# Patient Record
Sex: Female | Born: 1960
Health system: Southern US, Community
[De-identification: ages and names within clinical notes are randomized; demographics above are authoritative.]

## PROBLEM LIST (undated history)

## (undated) ENCOUNTER — Encounter

## (undated) ENCOUNTER — Telehealth

## (undated) ENCOUNTER — Encounter: Attending: Neurology | Primary: Neurology

## (undated) ENCOUNTER — Ambulatory Visit

## (undated) ENCOUNTER — Encounter: Attending: Neurological Surgery | Primary: Neurological Surgery

## (undated) ENCOUNTER — Encounter: Payer: MEDICARE | Attending: Neurology | Primary: Neurology

## (undated) ENCOUNTER — Ambulatory Visit: Attending: Pharmacist | Primary: Pharmacist

## (undated) ENCOUNTER — Ambulatory Visit: Payer: MEDICARE

## (undated) ENCOUNTER — Telehealth: Attending: Social Worker | Primary: Social Worker

## (undated) ENCOUNTER — Telehealth
Attending: Student in an Organized Health Care Education/Training Program | Primary: Student in an Organized Health Care Education/Training Program

## (undated) ENCOUNTER — Encounter: Attending: Pharmacist | Primary: Pharmacist

## (undated) ENCOUNTER — Encounter: Attending: Physician Assistant | Primary: Physician Assistant

## (undated) ENCOUNTER — Encounter
Attending: Pharmacist Clinician (PhC)/ Clinical Pharmacy Specialist | Primary: Pharmacist Clinician (PhC)/ Clinical Pharmacy Specialist

## (undated) ENCOUNTER — Telehealth: Attending: Neurology | Primary: Neurology

## (undated) ENCOUNTER — Telehealth: Attending: Clinical | Primary: Clinical

## (undated) ENCOUNTER — Telehealth
Attending: Pharmacist Clinician (PhC)/ Clinical Pharmacy Specialist | Primary: Pharmacist Clinician (PhC)/ Clinical Pharmacy Specialist

## (undated) ENCOUNTER — Ambulatory Visit
Attending: Pharmacist Clinician (PhC)/ Clinical Pharmacy Specialist | Primary: Pharmacist Clinician (PhC)/ Clinical Pharmacy Specialist

## (undated) ENCOUNTER — Non-Acute Institutional Stay: Payer: MEDICARE | Attending: Neurology | Primary: Neurology

## (undated) DIAGNOSIS — Z9981 Dependence on supplemental oxygen: Secondary | ICD-10-CM

## (undated) DIAGNOSIS — I671 Cerebral aneurysm, nonruptured: Secondary | ICD-10-CM

## (undated) DIAGNOSIS — I2721 Secondary pulmonary arterial hypertension: Secondary | ICD-10-CM

## (undated) DIAGNOSIS — I700 Atherosclerosis of aorta: Secondary | ICD-10-CM

## (undated) DIAGNOSIS — Z720 Tobacco use: Secondary | ICD-10-CM

## (undated) DIAGNOSIS — G4730 Sleep apnea, unspecified: Secondary | ICD-10-CM

## (undated) DIAGNOSIS — M5030 Other cervical disc degeneration, unspecified cervical region: Secondary | ICD-10-CM

## (undated) DIAGNOSIS — I209 Angina pectoris, unspecified: Secondary | ICD-10-CM

## (undated) DIAGNOSIS — R519 Headache, unspecified: Secondary | ICD-10-CM

## (undated) DIAGNOSIS — M797 Fibromyalgia: Secondary | ICD-10-CM

## (undated) DIAGNOSIS — E038 Other specified hypothyroidism: Secondary | ICD-10-CM

## (undated) DIAGNOSIS — N189 Chronic kidney disease, unspecified: Secondary | ICD-10-CM

## (undated) DIAGNOSIS — G8929 Other chronic pain: Secondary | ICD-10-CM

## (undated) DIAGNOSIS — M791 Myalgia, unspecified site: Secondary | ICD-10-CM

## (undated) DIAGNOSIS — I201 Angina pectoris with documented spasm: Secondary | ICD-10-CM

## (undated) DIAGNOSIS — F329 Major depressive disorder, single episode, unspecified: Secondary | ICD-10-CM

## (undated) DIAGNOSIS — F419 Anxiety disorder, unspecified: Secondary | ICD-10-CM

## (undated) DIAGNOSIS — J9611 Chronic respiratory failure with hypoxia: Secondary | ICD-10-CM

## (undated) DIAGNOSIS — R0600 Dyspnea, unspecified: Secondary | ICD-10-CM

## (undated) DIAGNOSIS — J45909 Unspecified asthma, uncomplicated: Secondary | ICD-10-CM

## (undated) DIAGNOSIS — M792 Neuralgia and neuritis, unspecified: Secondary | ICD-10-CM

## (undated) DIAGNOSIS — G35 Multiple sclerosis: Secondary | ICD-10-CM

## (undated) DIAGNOSIS — M609 Myositis, unspecified: Secondary | ICD-10-CM

## (undated) DIAGNOSIS — I219 Acute myocardial infarction, unspecified: Secondary | ICD-10-CM

## (undated) DIAGNOSIS — J42 Unspecified chronic bronchitis: Secondary | ICD-10-CM

## (undated) DIAGNOSIS — R5383 Other fatigue: Secondary | ICD-10-CM

## (undated) DIAGNOSIS — I509 Heart failure, unspecified: Secondary | ICD-10-CM

## (undated) DIAGNOSIS — F32A Depression, unspecified: Secondary | ICD-10-CM

## (undated) DIAGNOSIS — M51369 Other intervertebral disc degeneration, lumbar region without mention of lumbar back pain or lower extremity pain: Secondary | ICD-10-CM

## (undated) DIAGNOSIS — J449 Chronic obstructive pulmonary disease, unspecified: Secondary | ICD-10-CM

## (undated) DIAGNOSIS — I2510 Atherosclerotic heart disease of native coronary artery without angina pectoris: Secondary | ICD-10-CM

## (undated) DIAGNOSIS — F411 Generalized anxiety disorder: Secondary | ICD-10-CM

## (undated) DIAGNOSIS — T6701XA Heatstroke and sunstroke, initial encounter: Secondary | ICD-10-CM

## (undated) DIAGNOSIS — I3139 Other pericardial effusion (noninflammatory): Secondary | ICD-10-CM

## (undated) DIAGNOSIS — F1190 Opioid use, unspecified, uncomplicated: Secondary | ICD-10-CM

## (undated) DIAGNOSIS — I639 Cerebral infarction, unspecified: Secondary | ICD-10-CM

## (undated) DIAGNOSIS — K589 Irritable bowel syndrome without diarrhea: Secondary | ICD-10-CM

## (undated) DIAGNOSIS — I5032 Chronic diastolic (congestive) heart failure: Secondary | ICD-10-CM

## (undated) DIAGNOSIS — M546 Pain in thoracic spine: Secondary | ICD-10-CM

## (undated) DIAGNOSIS — G35D Multiple sclerosis, unspecified: Secondary | ICD-10-CM

## (undated) DIAGNOSIS — G894 Chronic pain syndrome: Secondary | ICD-10-CM

## (undated) DIAGNOSIS — I10 Essential (primary) hypertension: Secondary | ICD-10-CM

## (undated) DIAGNOSIS — M1990 Unspecified osteoarthritis, unspecified site: Secondary | ICD-10-CM

## (undated) HISTORY — PX: LAPAROSCOPIC ENDOMETRIOSIS FULGURATION: SUR769

## (undated) HISTORY — PX: ABDOMINAL HYSTERECTOMY: SHX81

## (undated) HISTORY — DX: Chronic obstructive pulmonary disease, unspecified: J44.9

## (undated) HISTORY — DX: Angina pectoris with documented spasm: I20.1

## (undated) HISTORY — DX: Tobacco use: Z72.0

## (undated) HISTORY — DX: Multiple sclerosis, unspecified: G35.D

## (undated) HISTORY — DX: Chronic diastolic (congestive) heart failure: I50.32

## (undated) HISTORY — DX: Unspecified osteoarthritis, unspecified site: M19.90

## (undated) HISTORY — DX: Irritable bowel syndrome, unspecified: K58.9

## (undated) HISTORY — DX: Fibromyalgia: M79.7

## (undated) HISTORY — DX: Secondary pulmonary arterial hypertension: I27.21

## (undated) HISTORY — DX: Depression, unspecified: F32.A

## (undated) HISTORY — DX: Major depressive disorder, single episode, unspecified: F32.9

## (undated) HISTORY — DX: Multiple sclerosis: G35

## (undated) HISTORY — DX: Other pericardial effusion (noninflammatory): I31.39

---
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
## 1999-12-26 HISTORY — PX: FOOT SURGERY: SHX648

## 2004-12-25 DIAGNOSIS — T6701XA Heatstroke and sunstroke, initial encounter: Secondary | ICD-10-CM

## 2004-12-25 HISTORY — DX: Heatstroke and sunstroke, initial encounter: T67.01XA

## 2005-04-20 ENCOUNTER — Ambulatory Visit: Payer: Self-pay | Admitting: Unknown Physician Specialty

## 2008-05-15 ENCOUNTER — Ambulatory Visit: Payer: Self-pay

## 2010-05-16 ENCOUNTER — Ambulatory Visit: Payer: Self-pay | Admitting: Gastroenterology

## 2010-08-25 LAB — HM MAMMOGRAPHY

## 2011-12-15 ENCOUNTER — Ambulatory Visit: Payer: Self-pay | Admitting: Family Medicine

## 2012-08-02 ENCOUNTER — Ambulatory Visit: Payer: Self-pay | Admitting: Otolaryngology

## 2012-10-21 ENCOUNTER — Encounter: Payer: Self-pay | Admitting: *Deleted

## 2012-10-21 ENCOUNTER — Ambulatory Visit (INDEPENDENT_AMBULATORY_CARE_PROVIDER_SITE_OTHER): Payer: BC Managed Care – PPO | Admitting: Internal Medicine

## 2012-10-21 ENCOUNTER — Encounter: Payer: Self-pay | Admitting: Internal Medicine

## 2012-10-21 ENCOUNTER — Ambulatory Visit (INDEPENDENT_AMBULATORY_CARE_PROVIDER_SITE_OTHER)
Admission: RE | Admit: 2012-10-21 | Discharge: 2012-10-21 | Disposition: A | Discharge status: Home / Self Care | Payer: BC Managed Care – PPO | Source: Ambulatory Visit | Attending: Internal Medicine | Admitting: Internal Medicine

## 2012-10-21 VITALS — BP 140/92 | HR 89 | Temp 98.4°F | Ht 65.0 in | Wt 178.5 lb

## 2012-10-21 DIAGNOSIS — M545 Low back pain, unspecified: Secondary | ICD-10-CM

## 2012-10-21 DIAGNOSIS — IMO0001 Reserved for inherently not codable concepts without codable children: Secondary | ICD-10-CM

## 2012-10-21 DIAGNOSIS — E039 Hypothyroidism, unspecified: Secondary | ICD-10-CM

## 2012-10-21 DIAGNOSIS — M797 Fibromyalgia: Secondary | ICD-10-CM

## 2012-10-21 DIAGNOSIS — A6000 Herpesviral infection of urogenital system, unspecified: Secondary | ICD-10-CM

## 2012-10-21 DIAGNOSIS — E038 Other specified hypothyroidism: Secondary | ICD-10-CM | POA: Insufficient documentation

## 2012-10-21 MED ORDER — ACYCLOVIR 400 MG PO TABS: 400.0000 mg | ORAL_TABLET | Freq: Every day | ORAL | Status: DC

## 2012-10-21 MED ORDER — LEVOTHYROXINE SODIUM 50 MCG PO TABS: 50.0000 ug | ORAL_TABLET | Freq: Every day | ORAL | Status: DC

## 2012-10-21 MED ORDER — LIDOCAINE 5 % EX PTCH: 1.0000 | MEDICATED_PATCH | CUTANEOUS | Status: DC

## 2012-10-21 MED ORDER — PREGABALIN 50 MG PO CAPS: 50.0000 mg | ORAL_CAPSULE | Freq: Three times a day (TID) | ORAL | Status: DC

## 2012-10-21 NOTE — Progress Notes (Signed)
Subjective:    Patient ID: Debra Hogan, female    DOB: 05/20/1961, 51 y.o.   MRN: [id number]  HPI 51 year old female with history of fibromyalgia, COPD, hypothyroidism presents to establish care. Her primary concern today is severe fibromyalgia pain. She reports pain in her arms, upper and lower back, and legs. She reports that pain is severe it limits her ability to function. She has tried nonsteroidal medications, muscle relaxers, tramadol with no improvement. She has also tried Cymbalta with no improvement. She requests stronger medication. She reports that her previous physician refused to write narcotic medication for her.  In regards to hypothyroidism, she reports she has been off her Synthroid for several months. She would like to restart this medication.  In regards to general herpes, she reports that symptoms were previously well controlled with the use of acyclovir. She would like to get a refill on this medication today.  Outpatient Encounter Prescriptions as of 10/21/2012  Medication Sig Dispense Refill  . acyclovir (ZOVIRAX) 400 MG tablet Take 1 tablet (400 mg total) by mouth daily.  90 tablet  3  . Fluticasone-Salmeterol (ADVAIR) 100-50 MCG/DOSE AEPB Inhale 1 puff into the lungs as needed.      Marland Kitchen levothyroxine (SYNTHROID, LEVOTHROID) 50 MCG tablet Take 1 tablet (50 mcg total) by mouth daily.  90 tablet  4  . lidocaine (LIDODERM) 5 % Place 1 patch onto the skin daily. Remove & Discard patch within 12 hours or as directed by MD  30 patch  0  . pregabalin (LYRICA) 50 MG capsule Take 1 capsule (50 mg total) by mouth 3 (three) times daily.  60 capsule  3  . rotigotine (NEUPRO) 2 MG/24HR Place 1 patch onto the skin daily.       BP 140/92  Pulse 89  Temp 98.4 F (36.9 C) (Oral)  Ht 5\' 5"  (1.651 m)  Wt 178 lb 8 oz (80.967 kg)  BMI 29.70 kg/m2  SpO2 96%  Review of Systems  Constitutional: Positive for fatigue. Negative for fever, chills, appetite change and unexpected weight change.   HENT: Negative for ear pain, congestion, sore throat, trouble swallowing, neck pain, voice change and sinus pressure.   Eyes: Negative for visual disturbance.  Respiratory: Negative for cough, shortness of breath, wheezing and stridor.   Cardiovascular: Negative for chest pain, palpitations and leg swelling.  Gastrointestinal: Negative for nausea, vomiting, abdominal pain, diarrhea, constipation, blood in stool, abdominal distention and anal bleeding.  Genitourinary: Negative for dysuria and flank pain.  Musculoskeletal: Positive for myalgias, back pain and arthralgias. Negative for gait problem.  Skin: Negative for color change and rash.  Neurological: Negative for dizziness and headaches.  Hematological: Negative for adenopathy. Does not bruise/bleed easily.  Psychiatric/Behavioral: Negative for suicidal ideas, disturbed wake/sleep cycle and dysphoric mood. The patient is not nervous/anxious.        Objective:   Physical Exam  Constitutional: She is oriented to person, place, and time. She appears well-developed and well-nourished. No distress.  HENT:  Head: Normocephalic and atraumatic.  Right Ear: External ear normal.  Left Ear: External ear normal.  Nose: Nose normal.  Mouth/Throat: Oropharynx is clear and moist. No oropharyngeal exudate.  Eyes: Conjunctivae normal are normal. Pupils are equal, round, and reactive to light. Right eye exhibits no discharge. Left eye exhibits no discharge. No scleral icterus.  Neck: Normal range of motion. Neck supple. No tracheal deviation present. No thyromegaly present.  Cardiovascular: Normal rate, regular rhythm, normal heart sounds and intact distal pulses.  Exam reveals no gallop and no friction rub.   No murmur heard. Pulmonary/Chest: Effort normal and breath sounds normal. No respiratory distress. She has no wheezes. She has no rales. She exhibits no tenderness.  Musculoskeletal: She exhibits no edema and no tenderness.       Cervical back:  She exhibits decreased range of motion, tenderness and pain.       Lumbar back: She exhibits decreased range of motion, tenderness, pain and spasm.  Lymphadenopathy:    She has no cervical adenopathy.  Neurological: She is alert and oriented to person, place, and time. No cranial nerve deficit. She exhibits normal muscle tone. Coordination normal.  Skin: Skin is warm and dry. No rash noted. She is not diaphoretic. No erythema. No pallor.  Psychiatric: She has a normal mood and affect. Her behavior is normal. Judgment and thought content normal.          Assessment & Plan:

## 2012-10-21 NOTE — Assessment & Plan Note (Signed)
Patient has been off Synthroid. Will restart today. Will check TSH with labs. Followup one month.

## 2012-10-21 NOTE — Assessment & Plan Note (Signed)
Patient reports history of fibromyalgia. No improvement with nonsteroidals, tramadol, muscle relaxers. Requests stronger medication. Will try Lyrica starting at 50 mg twice daily. She Campanelli titrate up to 3 times daily. We'll also add a Lidoderm patch over her lower back at site of severe pain. She will e-mail with update. We'll also set up evaluation with rheumatology. Would prefer to avoid use of narcotic medications for chronic pain management.

## 2012-10-21 NOTE — Assessment & Plan Note (Signed)
Symptoms well controlled with daily acyclovir. Will refill acyclovir today.

## 2012-10-21 NOTE — Assessment & Plan Note (Signed)
Low back pain likely secondary to degenerative disease in the lumbar spine, seen on plain film today. As above, adding Lidoderm patch to see if any improvement in pain control. Followup one month.

## 2012-11-07 ENCOUNTER — Encounter: Payer: Self-pay | Admitting: Internal Medicine

## 2012-11-18 ENCOUNTER — Ambulatory Visit (INDEPENDENT_AMBULATORY_CARE_PROVIDER_SITE_OTHER): Payer: BC Managed Care – PPO | Admitting: Internal Medicine

## 2012-11-18 ENCOUNTER — Encounter: Payer: Self-pay | Admitting: Internal Medicine

## 2012-11-18 VITALS — BP 150/88 | HR 93 | Temp 98.0°F | Resp 16 | Wt 182.0 lb

## 2012-11-18 DIAGNOSIS — M797 Fibromyalgia: Secondary | ICD-10-CM

## 2012-11-18 DIAGNOSIS — IMO0001 Reserved for inherently not codable concepts without codable children: Secondary | ICD-10-CM

## 2012-11-18 MED ORDER — DULOXETINE HCL 30 MG PO CPEP: 30.0000 mg | ORAL_CAPSULE | Freq: Every day | ORAL | Status: DC

## 2012-11-18 MED ORDER — PREGABALIN 50 MG PO CAPS: 100.0000 mg | ORAL_CAPSULE | Freq: Two times a day (BID) | ORAL | Status: DC

## 2012-11-18 MED ORDER — CYCLOBENZAPRINE HCL 5 MG PO TABS: 5.0000 mg | ORAL_TABLET | Freq: Every evening | ORAL | Status: DC | PRN

## 2012-11-18 NOTE — Assessment & Plan Note (Signed)
Minimal improvement with Lyrica. Will increase dose to 100 mg twice daily. Will add Cymbalta 30 mg daily. Will add Flexeril 5 mg at bedtime to help with muscle spasm. Rheumatology referral in process. Will check labs today including CBC, CMP, ESR, ANA, rheumatoid factor, TSH, B12. Followup one month or sooner if needed.

## 2012-11-18 NOTE — Progress Notes (Signed)
Subjective:    Patient ID: Debra Hogan, female    DOB: September 30, 1961, 51 y.o.   MRN: [id number]  HPI 51 year old female with history of fibromyalgia presents for followup. She reports severe diffuse pain, rated 10 out of 10, as the worst pain of her life. She reports spasm in the muscles of her back and legs. She has crunching and writhing in pain during exam. At her last visit, she was started on Lyrica and is now taking 50 mg twice daily with no improvement in her symptoms. She was also started on Lidoderm patch no improvement. She reports that nonsteroidal such as meloxicam do not help her. She reports difficulty working and functioning because of pain. Pain is described as severe aching and cramping over her trunk, legs, and arms. She denies swelling in her joints, redness over her joints. She denies fever or chills. She denies shortness of breath.  Outpatient Encounter Prescriptions as of 11/18/2012  Medication Sig Dispense Refill  . acyclovir (ZOVIRAX) 400 MG tablet Take 1 tablet (400 mg total) by mouth daily.  90 tablet  3  . Fluticasone-Salmeterol (ADVAIR) 100-50 MCG/DOSE AEPB Inhale 1 puff into the lungs as needed.      Marland Kitchen levothyroxine (SYNTHROID, LEVOTHROID) 50 MCG tablet Take 1 tablet (50 mcg total) by mouth daily.  90 tablet  4  . lidocaine (LIDODERM) 5 % Place 1 patch onto the skin daily. Remove & Discard patch within 12 hours or as directed by MD  30 patch  0  . pregabalin (LYRICA) 50 MG capsule Take 2 capsules (100 mg total) by mouth 2 (two) times daily.  60 capsule  3  . rotigotine (NEUPRO) 2 MG/24HR Place 1 patch onto the skin daily.      . [DISCONTINUED] pregabalin (LYRICA) 50 MG capsule Take 1 capsule (50 mg total) by mouth 3 (three) times daily.  60 capsule  3  . cyclobenzaprine (FLEXERIL) 5 MG tablet Take 1 tablet (5 mg total) by mouth at bedtime as needed for muscle spasms.  30 tablet  1  . DULoxetine (CYMBALTA) 30 MG capsule Take 1 capsule (30 mg total) by mouth daily.  30 capsule   3   BP 150/88  Pulse 93  Temp 98 F (36.7 C) (Oral)  Resp 16  Wt 182 lb (82.555 kg)  Review of Systems  Constitutional: Negative for fever, chills, appetite change, fatigue and unexpected weight change.  HENT: Negative for ear pain, congestion, sore throat, trouble swallowing, neck pain, voice change and sinus pressure.   Eyes: Negative for visual disturbance.  Respiratory: Negative for cough, shortness of breath, wheezing and stridor.   Cardiovascular: Negative for chest pain, palpitations and leg swelling.  Gastrointestinal: Negative for nausea, vomiting, abdominal pain, diarrhea, constipation, blood in stool, abdominal distention and anal bleeding.  Genitourinary: Negative for dysuria and flank pain.  Musculoskeletal: Positive for myalgias and arthralgias. Negative for gait problem.  Skin: Negative for color change and rash.  Neurological: Negative for dizziness and headaches.  Hematological: Negative for adenopathy. Does not bruise/bleed easily.  Psychiatric/Behavioral: Positive for dysphoric mood. Negative for suicidal ideas and sleep disturbance. The patient is not nervous/anxious.        Objective:   Physical Exam  Constitutional: She is oriented to person, place, and time. She appears well-developed and well-nourished. No distress.  HENT:  Head: Normocephalic and atraumatic.  Right Ear: External ear normal.  Left Ear: External ear normal.  Nose: Nose normal.  Mouth/Throat: Oropharynx is clear and moist. No oropharyngeal  exudate.  Eyes: Conjunctivae normal are normal. Pupils are equal, round, and reactive to light. Right eye exhibits no discharge. Left eye exhibits no discharge. No scleral icterus.  Neck: Normal range of motion. Neck supple. No tracheal deviation present. No thyromegaly present.  Cardiovascular: Normal rate, regular rhythm, normal heart sounds and intact distal pulses.  Exam reveals no gallop and no friction rub.   No murmur heard. Pulmonary/Chest:  Effort normal and breath sounds normal. No respiratory distress. She has no wheezes. She has no rales. She exhibits no tenderness.  Musculoskeletal: Normal range of motion. She exhibits no edema and no tenderness.       Right shoulder: She exhibits tenderness.       Left shoulder: She exhibits tenderness.       Right elbow: tenderness found.       Left elbow: tenderness found.       Right wrist: She exhibits tenderness.       Left wrist: She exhibits tenderness.       Cervical back: She exhibits tenderness.       Diffuse tenderness to palpations but no obvious abnormalities of musculature or joints noted.  Lymphadenopathy:    She has no cervical adenopathy.  Neurological: She is alert and oriented to person, place, and time. No cranial nerve deficit. She exhibits normal muscle tone. Coordination normal.  Skin: Skin is warm and dry. No rash noted. She is not diaphoretic. No erythema. No pallor.  Psychiatric: Her speech is normal and behavior is normal. Judgment and thought content normal. She exhibits a depressed mood.          Assessment & Plan:

## 2012-11-19 LAB — SEDIMENTATION RATE: Sed Rate: 19 mm/hr (ref 0–22)

## 2012-11-19 LAB — RHEUMATOID FACTOR: Rhuematoid fact SerPl-aCnc: <10 IU/mL (ref <=14)

## 2012-11-19 LAB — CBC WITH DIFFERENTIAL/PLATELET
Basophils Relative: 0.9 % (ref 0.0–3.0)
Eosinophils Absolute: 0.1 10*3/uL (ref 0.0–0.7)
MCHC: 33.5 g/dL (ref 30.0–36.0)
MCV: 92.2 fl (ref 78.0–100.0)
Monocytes Absolute: 0.4 10*3/uL (ref 0.1–1.0)
Neutrophils Relative %: 62.5 % (ref 43.0–77.0)
Platelets: 216 10*3/uL (ref 150.0–400.0)
RBC: 4.17 Mil/uL (ref 3.87–5.11)
RDW: 12.6 % (ref 11.5–14.6)

## 2012-11-19 LAB — COMPREHENSIVE METABOLIC PANEL
AST: 34 U/L (ref 0–37)
Albumin: 4 g/dL (ref 3.5–5.2)
Alkaline Phosphatase: 73 U/L (ref 39–117)
BUN: 11 mg/dL (ref 6–23)
Potassium: 3.5 mEq/L (ref 3.5–5.1)

## 2012-11-19 LAB — C-REACTIVE PROTEIN: CRP: 1.2 mg/dL (ref 0.5–20.0)

## 2012-11-19 LAB — TSH: TSH: 1.31 u[IU]/mL (ref 0.35–5.50)

## 2012-11-24 ENCOUNTER — Encounter: Payer: Self-pay | Admitting: Internal Medicine

## 2012-12-01 ENCOUNTER — Encounter: Payer: Self-pay | Admitting: Internal Medicine

## 2013-01-01 ENCOUNTER — Ambulatory Visit (INDEPENDENT_AMBULATORY_CARE_PROVIDER_SITE_OTHER): Payer: BC Managed Care – PPO | Admitting: Internal Medicine

## 2013-01-01 ENCOUNTER — Encounter: Payer: Self-pay | Admitting: Internal Medicine

## 2013-01-01 VITALS — BP 124/80 | HR 95 | Temp 98.4°F | Ht 65.0 in | Wt 176.5 lb

## 2013-01-01 DIAGNOSIS — M797 Fibromyalgia: Secondary | ICD-10-CM

## 2013-01-01 DIAGNOSIS — IMO0001 Reserved for inherently not codable concepts without codable children: Secondary | ICD-10-CM

## 2013-01-01 MED ORDER — DULOXETINE HCL 60 MG PO CPEP: 60.0000 mg | ORAL_CAPSULE | Freq: Every day | ORAL | Status: DC

## 2013-01-01 MED ORDER — PREGABALIN 75 MG PO CAPS: 75.0000 mg | ORAL_CAPSULE | Freq: Two times a day (BID) | ORAL | Status: DC

## 2013-01-01 NOTE — Assessment & Plan Note (Signed)
Patient reports that she was not taking Lyrica. She has been taking Cymbalta. Will try increasing Cymbalta dose to 60 mg daily. We'll then add Lyrica 75 mg twice daily. Samples were given today. Rheumatology referral is scheduled for later this month. Recent lab evaluation was unremarkable. Symptoms of severe pain are persistent and are not explained by exam findings. Follow up 1 month.

## 2013-01-01 NOTE — Progress Notes (Signed)
Subjective:    Patient ID: Debra Hogan, female    DOB: 07-31-1961, 52 y.o.   MRN: [id number]  HPI 52 year old female with history of fibromyalgia presents for followup. She reports that she has been taking Cymbalta but not Lyrica. She reports no improvement with this medication. She describes anger and frustration at the fact that we have not been able to improve her pain control. She reports that pain limits her ability to sleep and function. Pain is described as diffuse aching and cramping throughout her muscles and joints. She is also having some increased fatigue and headache over the last couple of days. She reports that pain has been so bad at times that she is contemplating going to the emergency room. She had tried using Flexeril with no improvement.   Outpatient Encounter Prescriptions as of 01/01/2013  Medication Sig Dispense Refill  . acyclovir (ZOVIRAX) 400 MG tablet Take 1 tablet (400 mg total) by mouth daily.  90 tablet  3  . cyclobenzaprine (FLEXERIL) 5 MG tablet Take 1 tablet (5 mg total) by mouth at bedtime as needed for muscle spasms.  30 tablet  1  . DULoxetine (CYMBALTA) 60 MG capsule Take 1 capsule (60 mg total) by mouth daily.  30 capsule  3  . Fluticasone-Salmeterol (ADVAIR) 100-50 MCG/DOSE AEPB Inhale 1 puff into the lungs as needed.      Marland Kitchen levothyroxine (SYNTHROID, LEVOTHROID) 50 MCG tablet Take 1 tablet (50 mcg total) by mouth daily.  90 tablet  4   BP 124/80  Pulse 95  Temp 98.4 F (36.9 C) (Oral)  Ht 5\' 5"  (1.651 m)  Wt 176 lb 8 oz (80.06 kg)  BMI 29.37 kg/m2  SpO2 93%  Review of Systems  Constitutional: Negative for fever, chills, appetite change, fatigue and unexpected weight change.  HENT: Negative for ear pain, congestion, sore throat, trouble swallowing, neck pain, voice change and sinus pressure.   Eyes: Negative for visual disturbance.  Respiratory: Negative for cough, shortness of breath, wheezing and stridor.   Cardiovascular: Negative for chest pain,  palpitations and leg swelling.  Gastrointestinal: Negative for nausea, vomiting, abdominal pain, diarrhea, constipation, blood in stool, abdominal distention and anal bleeding.  Genitourinary: Negative for dysuria and flank pain.  Musculoskeletal: Positive for myalgias, back pain and arthralgias. Negative for gait problem.  Skin: Negative for color change and rash.  Neurological: Negative for dizziness and headaches.  Hematological: Negative for adenopathy. Does not bruise/bleed easily.  Psychiatric/Behavioral: Positive for sleep disturbance and dysphoric mood. Negative for suicidal ideas. The patient is nervous/anxious.        Objective:   Physical Exam  Constitutional: She is oriented to person, place, and time. She appears well-developed and well-nourished. No distress.  HENT:  Head: Normocephalic and atraumatic.  Right Ear: External ear normal.  Left Ear: External ear normal.  Nose: Nose normal.  Mouth/Throat: Oropharynx is clear and moist. No oropharyngeal exudate.  Eyes: Conjunctivae normal are normal. Pupils are equal, round, and reactive to light. Right eye exhibits no discharge. Left eye exhibits no discharge. No scleral icterus.  Neck: Normal range of motion. Neck supple. No tracheal deviation present. No thyromegaly present.  Cardiovascular: Normal rate, regular rhythm, normal heart sounds and intact distal pulses.  Exam reveals no gallop and no friction rub.   No murmur heard. Pulmonary/Chest: Effort normal and breath sounds normal. No respiratory distress. She has no wheezes. She has no rales. She exhibits no tenderness.  Musculoskeletal: Normal range of motion. She exhibits no edema  and no tenderness.       Cervical back: She exhibits pain.       Thoracic back: She exhibits pain.       Lumbar back: She exhibits pain.       Patient reports diffuse pain, most prominent in back, but also present in joints, muscles diffusely.  Lymphadenopathy:    She has no cervical  adenopathy.  Neurological: She is alert and oriented to person, place, and time. No cranial nerve deficit. She exhibits normal muscle tone. Coordination normal.  Skin: Skin is warm and dry. No rash noted. She is not diaphoretic. No erythema. No pallor.  Psychiatric: Judgment and thought content normal. Her mood appears anxious. Her affect is angry. She is agitated.          Assessment & Plan:

## 2013-01-30 ENCOUNTER — Ambulatory Visit (INDEPENDENT_AMBULATORY_CARE_PROVIDER_SITE_OTHER): Payer: BC Managed Care – PPO | Admitting: Internal Medicine

## 2013-01-30 ENCOUNTER — Encounter: Payer: Self-pay | Admitting: Internal Medicine

## 2013-01-30 VITALS — BP 120/80 | HR 86 | Temp 98.5°F

## 2013-01-30 DIAGNOSIS — A6000 Herpesviral infection of urogenital system, unspecified: Secondary | ICD-10-CM

## 2013-01-30 DIAGNOSIS — M797 Fibromyalgia: Secondary | ICD-10-CM

## 2013-01-30 DIAGNOSIS — IMO0001 Reserved for inherently not codable concepts without codable children: Secondary | ICD-10-CM

## 2013-01-30 MED ORDER — ACYCLOVIR 400 MG PO TABS: 400.0000 mg | ORAL_TABLET | Freq: Every day | ORAL | Status: DC

## 2013-01-30 MED ORDER — KETOROLAC TROMETHAMINE 30 MG/ML IJ SOLN
30.0000 mg | Freq: Once | INTRAMUSCULAR | Status: AC
  Administered 2013-01-30: 30 mg via INTRAMUSCULAR

## 2013-01-30 MED ORDER — PREGABALIN 150 MG PO CAPS: 150.0000 mg | ORAL_CAPSULE | Freq: Every evening | ORAL | Status: DC | PRN

## 2013-01-30 NOTE — Progress Notes (Signed)
Subjective:    Patient ID: Debra Hogan, female    DOB: [birth year], 52 y.o.   MRN: [id number]  HPI 51YO female with h/o fibromyalgia presents for follow up. Continues to have severe, diffuse "10/10" pain, today most prominent in right shoulder and right flank. Also c/o headache pain. These are typical of symptoms of fibromyalgia. Taking Cymbalta, but has not yet started Lyrica because unable to afford medication.Evaluated by rheumatology and lab evaluation pending.  Outpatient Encounter Prescriptions as of 01/30/2013  Medication Sig Dispense Refill  . acyclovir (ZOVIRAX) 400 MG tablet Take 1 tablet (400 mg total) by mouth daily.  90 tablet  3  . cyclobenzaprine (FLEXERIL) 5 MG tablet Take 1 tablet (5 mg total) by mouth at bedtime as needed for muscle spasms.  30 tablet  1  . DULoxetine (CYMBALTA) 60 MG capsule Take 1 capsule (60 mg total) by mouth daily.  30 capsule  3  . Fluticasone-Salmeterol (ADVAIR) 100-50 MCG/DOSE AEPB Inhale 1 puff into the lungs as needed.      Marland Kitchen levothyroxine (SYNTHROID, LEVOTHROID) 50 MCG tablet Take 1 tablet (50 mcg total) by mouth daily.  90 tablet  4  . pregabalin (LYRICA) 150 MG capsule Take 1 capsule (150 mg total) by mouth at bedtime as needed.  30 capsule  3  . [DISCONTINUED] acyclovir (ZOVIRAX) 400 MG tablet Take 1 tablet (400 mg total) by mouth daily.  90 tablet  3  . [DISCONTINUED] pregabalin (LYRICA) 75 MG capsule Take 1 capsule (75 mg total) by mouth 2 (two) times daily.  60 capsule  3  . [EXPIRED] ketorolac (TORADOL) 30 MG/ML injection 30 mg        BP 120/80  Pulse 86  Temp 98.5 F (36.9 C) (Oral)  SpO2 94%  Review of Systems  Constitutional: Positive for fatigue. Negative for fever, chills, appetite change and unexpected weight change.  HENT: Negative for ear pain, congestion, sore throat, trouble swallowing, neck pain, voice change and sinus pressure.   Eyes: Negative for visual disturbance.  Respiratory: Negative for cough, shortness of breath,  wheezing and stridor.   Cardiovascular: Negative for chest pain, palpitations and leg swelling.  Gastrointestinal: Negative for nausea, vomiting, abdominal pain, diarrhea, constipation, blood in stool, abdominal distention and anal bleeding.  Genitourinary: Negative for dysuria and flank pain.  Musculoskeletal: Positive for myalgias, back pain and arthralgias. Negative for gait problem.  Skin: Negative for color change and rash.  Neurological: Negative for dizziness and headaches.  Hematological: Negative for adenopathy. Does not bruise/bleed easily.  Psychiatric/Behavioral: Positive for dysphoric mood. Negative for suicidal ideas and sleep disturbance. The patient is not nervous/anxious.        Objective:   Physical Exam  Constitutional: She is oriented to person, place, and time. She appears well-developed and well-nourished. No distress.  HENT:  Head: Normocephalic and atraumatic.  Right Ear: External ear normal.  Left Ear: External ear normal.  Nose: Nose normal.  Mouth/Throat: Oropharynx is clear and moist. No oropharyngeal exudate.  Eyes: Conjunctivae normal are normal. Pupils are equal, round, and reactive to light. Right eye exhibits no discharge. Left eye exhibits no discharge. No scleral icterus.  Neck: Normal range of motion. Neck supple. No tracheal deviation present. No thyromegaly present.  Cardiovascular: Normal rate, regular rhythm, normal heart sounds and intact distal pulses.  Exam reveals no gallop and no friction rub.   No murmur heard. Pulmonary/Chest: Effort normal and breath sounds normal. No respiratory distress. She has no wheezes. She has no rales. She  exhibits no tenderness.  Musculoskeletal: Normal range of motion. She exhibits no edema and no tenderness.       Right shoulder: She exhibits tenderness and pain. She exhibits no swelling.  Lymphadenopathy:    She has no cervical adenopathy.  Neurological: She is alert and oriented to person, place, and time. No  cranial nerve deficit. She exhibits normal muscle tone. Coordination normal.  Skin: Skin is warm and dry. No rash noted. She is not diaphoretic. No erythema. No pallor.  Psychiatric: She has a normal mood and affect. Her behavior is normal. Judgment and thought content normal.          Assessment & Plan:

## 2013-01-30 NOTE — Assessment & Plan Note (Signed)
Persistent severe pain. Has not yet been able to fill Lyrica because of cost. Samples given today. Will continue Cymbalta and Lyrica. Follow up is scheduled with rheumatology. Will give injection of toradol today given flare of pain symptoms in right shoulder. Follow up here in 3 months.

## 2013-01-31 ENCOUNTER — Other Ambulatory Visit: Payer: Self-pay | Admitting: *Deleted

## 2013-01-31 DIAGNOSIS — A6000 Herpesviral infection of urogenital system, unspecified: Secondary | ICD-10-CM

## 2013-01-31 MED ORDER — ACYCLOVIR 400 MG PO TABS: 400.0000 mg | ORAL_TABLET | Freq: Every day | ORAL | Status: DC

## 2013-03-03 ENCOUNTER — Telehealth: Payer: Self-pay | Admitting: Internal Medicine

## 2013-03-03 ENCOUNTER — Encounter: Payer: Self-pay | Admitting: Adult Health

## 2013-03-03 ENCOUNTER — Ambulatory Visit (INDEPENDENT_AMBULATORY_CARE_PROVIDER_SITE_OTHER): Payer: BC Managed Care – PPO | Admitting: Adult Health

## 2013-03-03 VITALS — BP 140/80 | HR 89 | Temp 98.6°F | Resp 18 | Ht 65.0 in | Wt 179.5 lb

## 2013-03-03 DIAGNOSIS — J329 Chronic sinusitis, unspecified: Secondary | ICD-10-CM

## 2013-03-03 DIAGNOSIS — R509 Fever, unspecified: Secondary | ICD-10-CM

## 2013-03-03 MED ORDER — FLUTICASONE PROPIONATE 50 MCG/ACT NA SUSP: 2.0000 | Freq: Every day | NASAL | Status: DC

## 2013-03-03 MED ORDER — AMOXICILLIN-POT CLAVULANATE 875-125 MG PO TABS: 1.0000 | ORAL_TABLET | Freq: Two times a day (BID) | ORAL | Status: DC

## 2013-03-03 NOTE — Telephone Encounter (Signed)
Spoke with patient and she has been scheduled to see Raquel.

## 2013-03-03 NOTE — Assessment & Plan Note (Addendum)
Patient with low grade fever; however, she has been taking RTC tylenol so suspect it Berling actually be higher. Negative for flu. Will start augmentin to treat empirically. RTC if no improvement in symptoms within 3-4 days.

## 2013-03-03 NOTE — Progress Notes (Signed)
  Subjective:    Patient ID: Debra Hogan, female    DOB: 03-Oct-1961, 52 y.o.   MRN: [id number]  HPI  Patient presents to clinic today with URI symptoms - cough, sinus pressure and congestion, malaise, low grade fever. Her symptoms began on Thursday. She has been taking Tylenol and tylenol cold and sinus with some improvement in symptoms. She denies sore throat, shortness of breath, cp.  Current Outpatient Prescriptions on File Prior to Visit  Medication Sig Dispense Refill  . acyclovir (ZOVIRAX) 400 MG tablet Take 1 tablet (400 mg total) by mouth Hogan.  90 tablet  3  . cyclobenzaprine (FLEXERIL) 5 MG tablet Take 1 tablet (5 mg total) by mouth at bedtime as needed for muscle spasms.  30 tablet  1  . DULoxetine (CYMBALTA) 60 MG capsule Take 1 capsule (60 mg total) by mouth Hogan.  30 capsule  3  . Fluticasone-Salmeterol (ADVAIR) 100-50 MCG/DOSE AEPB Inhale 1 puff into the lungs as needed.      Marland Kitchen levothyroxine (SYNTHROID, LEVOTHROID) 50 MCG tablet Take 1 tablet (50 mcg total) by mouth Hogan.  90 tablet  4  . pregabalin (LYRICA) 150 MG capsule Take 1 capsule (150 mg total) by mouth at bedtime as needed.  30 capsule  3   No current facility-administered medications on file prior to visit.    Review of Systems  Constitutional: Positive for fever, chills and fatigue.  HENT: Positive for congestion, rhinorrhea, postnasal drip and sinus pressure. Negative for sore throat.   Respiratory: Positive for cough. Negative for chest tightness, shortness of breath and wheezing.   Cardiovascular: Negative for chest pain.    BP 140/80  Pulse 89  Temp(Src) 98.6 F (37 C) (Oral)  Resp 18  Ht 5\' 5"  (1.651 m)  Wt 179 lb 8 oz (81.421 kg)  BMI 29.87 kg/m2  SpO2 95%     Objective:   Physical Exam  Constitutional: She is oriented to person, place, and time. She appears well-developed and well-nourished.  Appears not feeling well.  HENT:  Head: Normocephalic and atraumatic.  Right Ear: External ear  normal.  Left Ear: External ear normal.  Mouth/Throat: Oropharynx is clear and moist. No oropharyngeal exudate.  Cardiovascular: Normal rate, regular rhythm and normal heart sounds.  Exam reveals no gallop.   No murmur heard. Pulmonary/Chest: Effort normal and breath sounds normal. She has no wheezes. She has no rales.  Lymphadenopathy:    She has no cervical adenopathy.  Neurological: She is alert and oriented to person, place, and time.  Skin: Skin is warm and dry.  Psychiatric: She has a normal mood and affect. Her behavior is normal. Judgment and thought content normal.       Assessment & Plan:

## 2013-03-03 NOTE — Patient Instructions (Addendum)
Please start your antibiotic today.  Also start the flonase nasal.   What is sinusitis? - Sinusitis is a condition that can cause a stuffy nose, pain in the face, and yellow or green discharge (mucus) from the nose. The sinuses are hollow areas in the bones of the face They have a thin lining that normally makes a small amount of mucus. When this lining gets infected, it swells and makes extra mucus. This causes symptoms.   Sinusitis can occur when a person gets sick with a cold. The germs causing the cold can also infect the sinuses. Many times, a person feels like his or her cold is getting better. But then he or she gets sinusitis and begins to feel sick again.  What are the symptoms of sinusitis? - Common symptoms of sinusitis include: Stuffy or blocked nose  Thick yellow or green discharge from the nose  Pain in the teeth  Pain or pressure in the face - This often feels worse when a person bends forward.   People with sinusitis can also have other symptoms that include: Fever  Cough  Trouble smelling  Ear pressure or fullness  Headache  Bad breath  Feeling tired   Most of the time, symptoms start to improve in 7 to 10 days.  Should I see a doctor or nurse? - See your doctor or nurse if your symptoms last more than 7 days, or if your symptoms get better at first but then get worse. Sometimes, sinusitis can lead to serious problems. See your doctor or nurse right away (do not wait 7 days) if you have: Fever higher than 102.10F (39.2C)  Sudden and severe pain in the face and head  Trouble seeing or seeing double  Trouble thinking clearly  Swelling or redness around 1 or both eyes  Trouble breathing or a stiff neck   Is there anything I can do on my own to feel better? - Yes. To reduce your symptoms, you can: Take an over-the-counter pain reliever to reduce the pain  Rinse your nose and sinuses with salt water a few times a day - Ask your doctor or nurse about the best way  to do this.  Use a decongestant nose spray - These sprays are sold in a pharmacy. But do not use decongestant nose sprays for more than 2 to 3 days in a row. Using them more than 3 days in a row can make symptoms worse.   You should NOT take an antihistamine for sinusitis. Common antihistamines include diphenhydramine (sample brand name: Benadryl), chlorpheniramine (sample brand name: Chlor-Trimeton), loratadine (sample brand name: Claritin), and cetirizine (sample brand name: Zyrtec). They can treat allergies, but not sinus infections, and could increase your discomfort by drying the lining of your nose and sinuses, or making you tired.   Your doctor might also prescribe a steroid nose spray to reduce the swelling in your nose. (Steroid nose sprays do not contain the same steroids that athletes take to build muscle.)  How is sinusitis treated? - Most of the time, sinusitis does not need to be treated with antibiotic medicines. This is because most sinusitis is caused by viruses - not bacteria - and antibiotics do not kill viruses. Many people get over sinus infections without antibiotics.  Some people with sinusitis do need treatment with antibiotics. If your symptoms have not improved after 7 to 10 days, ask your doctor if you should take antibiotics. Your doctor might recommend that you wait 1 more week  to see if your symptoms improve. But if you have symptoms such as a fever or a lot of pain, he or she might prescribe antibiotics. It is important to follow your doctor's instructions about taking your antibiotics.

## 2013-03-03 NOTE — Telephone Encounter (Signed)
Patient Information:  Caller Name: [name]  Phone: [phone]  Patient: Debra Hogan, Debra Hogan  Gender: Female  DOB: 1961/08/31  Age: 52 Years  PCP: Ronna Polio (Adults only)  Pregnant: No  Office Follow Up:  Does the office need to follow up with this patient?: No  Instructions For The Office: N/A   Symptoms  Reason For Call & Symptoms: Cold SX started 02/27/13.  Head congestion, sneezing, cough and temp of 100.6.  Wanted antibiotic called in and instructed we cannot do that.  Just wants an appt for today.  Reviewed Health History In EMR: Yes  Reviewed Medications In EMR: Yes  Reviewed Allergies In EMR: Yes  Reviewed Surgeries / Procedures: Yes  Date of Onset of Symptoms: 02/17/2013  Treatments Tried: Tylenol Sinus and Cold and Tylenol.  Treatments Tried Worked: No  Any Fever: Yes  Fever Taken: Ear Thermometer  Fever Time Of Reading: 12:45:00  Fever Last Reading: 100.6 OB / GYN:  LMP: Unknown  Guideline(s) Used:  Colds  Disposition Per Guideline:   Home Care  Reason For Disposition Reached:   Colds with no complications  Advice Given:  N/A  RN Overrode Recommendation:  Make Appointment  Pt having no distress and requesting an appt so she can get back to work.  Appointment Scheduled:  03/03/2013 15:45:00 Appointment Scheduled Provider:  Orville Govern

## 2013-03-07 ENCOUNTER — Telehealth: Payer: Self-pay | Admitting: Internal Medicine

## 2013-03-07 NOTE — Telephone Encounter (Signed)
Patient informed, she said ok and thank you.

## 2013-03-07 NOTE — Telephone Encounter (Signed)
Patient Information:  Caller Name: Torry  Phone: [phone]  Patient: Debra Hogan, Debra Hogan  Gender: Female  DOB: [birth year]  Age: 52 Years  PCP: Ronna Polio (Adults only)  Pregnant: No  Office Follow Up:  Does the office need to follow up with this patient?: Yes  Instructions For The Office: [phone] Best number to reach patient. PATIENT STATES SHE IS WORSE WITH THROAT SCRATCY AND MUSCLE ACHES. PLEASE CONTACT.  Does not want to come to office for recheck due to the cost.  [facility]cy [facility] [address] in [place].  RN Note:  [phone] Best number to reach patient. PATIENT STATES SHE IS WORSE WITH THROAT SCRATCY AND MUSCLE ACHES. PLEASE CONTACT.  Does not want to come to office for recheck due to the cost.  [facility] [address] in [place].  Symptoms  Reason For Call & Symptoms: Patient states she was seen in office Monday 03/03/13 by Orville Govern and diagnosed with sinus infection. She was prescribed Augmentin and Flonase.  She states she feels worse. HER THROAT IS SCRATCHY AND SHE IS ACHING IN HER ARMS. + MUSCLE PAIN.  Deneis chest pain  or shortness of breath.  Sinus has improved due to flonase.  Diet today eggs toast and bacon with oj. Decreased fluid intake- drinking Mt. Dew and Tea. No humidier.  PATIENT CONCERN IS THROAT AND ARMS ACHING. DOES NOT WANT TO RETURN TO OFFICE DUE TO COST  Reviewed Health History In EMR: Yes  Reviewed Medications In EMR: Yes  Reviewed Allergies In EMR: Yes  Reviewed Surgeries / Procedures: Yes  Date of Onset of Symptoms: 03/03/2013  Treatments Tried: Augmentin and Flonase. Tylenol twice a day  Treatments Tried Worked: No  Any Fever: Yes  Fever Taken: Ear Thermometer  Fever Time Of Reading: 00:55:14  Fever Last Reading: 100.0 OB / GYN:  LMP: Unknown  Guideline(s) Used:  Sinus Pain and Congestion  Disposition Per Guideline:   See Today or Tomorrow in Office  Reason For Disposition Reached:   Sinus congestion (pressure,  fullness) present > 10 days  Advice Given:  Hydration:  Drink plenty of liquids (6-8 glasses of water daily). If the air in your home is dry, use a cool mist humidifier  Pain and Fever Medicines:  For pain or fever relief, take either acetaminophen or ibuprofen.  They are over-the-counter (OTC) drugs that help treat both fever and pain. You can buy them at the drugstore.  Treat fevers above 101 F (38.3 C). The goal of fever therapy is to bring the fever down to a comfortable level. Remember that fever medicine usually lowers fever 2 degrees F (1 - 1 1/2 degrees C).  Acetaminophen (e.g., Tylenol):  Regular Strength Tylenol: Take 650 mg (two 325 mg pills) by mouth every 4-6 hours as needed. Each Regular Strength Tylenol pill has 325 mg of acetaminophen.  Ibuprofen (e.g., Motrin, Advil):  Take 400 mg (two 200 mg pills) by mouth every 6 hours.  Call Back If:   Severe pain lasts longer than 2 hours after pain medicine  Sinus pain lasts longer than 1 day after starting treatment using nasal washes  Sinus congestion (fullness) lasts longer than 10 days  Fever lasts longer than 3 days  You become worse. DISCUSSED THE IMPORTANCE OF WATER AND FLUIDS. DECREASE CAFFEINE. DISCUSSED USE OF HONEY WARM FLUIDS FOR THROAT.    Patient Refused Recommendation:  Patient Will Follow Up With Office Later  [phone] Best number to reach patient. PATIENT STATES SHE IS WORSE WITH  THROAT SCRATCY AND MUSCLE ACHES. PLEASE CONTACT.  Does not want to come to office for recheck due to the cost.  [facility] [address] in [place].

## 2013-03-07 NOTE — Telephone Encounter (Signed)
I suspect symptoms Heaton be secondary to viral infection. Symptoms of scratchy throat, cough can persist for several weeks. Can you get more information from her and make sure no fever, chills, worsening shortness of breath or chest pain? If not, then we should probably give her symptoms more time to improve. We could see her early next week.

## 2013-03-07 NOTE — Telephone Encounter (Signed)
Was seen by Raquel she was told if not any better within 5 days then call back. She is not better and now have developed a scratchy throat and can barely talk, this began today. No fever now was having a fever before but not constant. Pain in her arms also. The throat discomfort is more like a scratchy feeling, not sore. Was given some antibiotics when she saw Raquel on 3/10 and was given Augmentin and Flonase

## 2013-03-07 NOTE — Telephone Encounter (Signed)
Has been having a low grade fever of like 99.3 all the time since this began and she is having chills now. But not having any chest pains or shortness of breath.

## 2013-03-07 NOTE — Telephone Encounter (Signed)
OK. Then she needs to be seen. Given that we are full today, she will need to go to urgent care.

## 2013-05-22 ENCOUNTER — Ambulatory Visit (INDEPENDENT_AMBULATORY_CARE_PROVIDER_SITE_OTHER): Payer: BC Managed Care – PPO | Admitting: Internal Medicine

## 2013-05-22 ENCOUNTER — Encounter: Payer: Self-pay | Admitting: Internal Medicine

## 2013-05-22 VITALS — BP 134/90 | HR 106 | Temp 99.0°F | Wt 176.0 lb

## 2013-05-22 DIAGNOSIS — F411 Generalized anxiety disorder: Secondary | ICD-10-CM | POA: Insufficient documentation

## 2013-05-22 DIAGNOSIS — F17200 Nicotine dependence, unspecified, uncomplicated: Secondary | ICD-10-CM | POA: Insufficient documentation

## 2013-05-22 DIAGNOSIS — Z720 Tobacco use: Secondary | ICD-10-CM

## 2013-05-22 DIAGNOSIS — M797 Fibromyalgia: Secondary | ICD-10-CM

## 2013-05-22 DIAGNOSIS — Z0000 Encounter for general adult medical examination without abnormal findings: Secondary | ICD-10-CM

## 2013-05-22 DIAGNOSIS — R911 Solitary pulmonary nodule: Secondary | ICD-10-CM | POA: Insufficient documentation

## 2013-05-22 DIAGNOSIS — IMO0001 Reserved for inherently not codable concepts without codable children: Secondary | ICD-10-CM

## 2013-05-22 LAB — HM MAMMOGRAPHY

## 2013-05-22 MED ORDER — VARENICLINE TARTRATE 0.5 MG X 11 & 1 MG X 42 PO MISC: ORAL | Status: DC

## 2013-05-22 MED ORDER — TRAMADOL HCL 50 MG PO TABS: 100.0000 mg | ORAL_TABLET | Freq: Three times a day (TID) | ORAL | Status: DC | PRN

## 2013-05-22 MED ORDER — ALPRAZOLAM 0.25 MG PO TABS: 0.2500 mg | ORAL_TABLET | Freq: Three times a day (TID) | ORAL | Status: DC | PRN

## 2013-05-22 NOTE — Progress Notes (Signed)
Subjective:    Patient ID: Debra Hogan, female    DOB: 25-Sep-1961, 52 y.o.   MRN: [id number]  HPI 52 year old female with history of fibromyalgia presents for followup. She reports that pain has been better controlled on current regimen including Celebrex, Cymbalta, Lyrica, and Flexeril at night. Her rheumatologist recently also added Robaxin at night. She reports that symptoms of diffuse chronic pain are sometimes worsened by increased anxiety. She notes significant stressors at home dealing with her teenage son. They're currently trying to evict him from the home. She would like to try medication to help further with anxiety.  Outpatient Encounter Prescriptions as of 05/22/2013  Medication Sig Dispense Refill  . acyclovir (ZOVIRAX) 400 MG tablet Take 1 tablet (400 mg total) by mouth daily.  90 tablet  3  . Celecoxib (CELEBREX PO) Take by mouth.      . cyclobenzaprine (FLEXERIL) 5 MG tablet Take 1 tablet (5 mg total) by mouth at bedtime as needed for muscle spasms.  30 tablet  1  . DULoxetine (CYMBALTA) 60 MG capsule Take 1 capsule (60 mg total) by mouth daily.  30 capsule  3  . fluticasone (FLONASE) 50 MCG/ACT nasal spray Place 2 sprays into the nose daily.  16 g  6  . Fluticasone-Salmeterol (ADVAIR) 100-50 MCG/DOSE AEPB Inhale 1 puff into the lungs as needed.      Marland Kitchen levothyroxine (SYNTHROID, LEVOTHROID) 50 MCG tablet Take 1 tablet (50 mcg total) by mouth daily.  90 tablet  4  . methocarbamol (ROBAXIN) 500 MG tablet       . pregabalin (LYRICA) 150 MG capsule Take 1 capsule (150 mg total) by mouth at bedtime as needed.  30 capsule  3  . traMADol (ULTRAM) 50 MG tablet Take 2 tablets (100 mg total) by mouth every 8 (eight) hours as needed for pain.  90 tablet  3  . Vitamin D, Ergocalciferol, (DRISDOL) 50000 UNITS CAPS       . [DISCONTINUED] traMADol (ULTRAM) 50 MG tablet       . ALPRAZolam (XANAX) 0.25 MG tablet Take 1 tablet (0.25 mg total) by mouth 3 (three) times daily as needed for sleep or  anxiety.  90 tablet  1  . varenicline (CHANTIX STARTING MONTH PAK) 0.5 MG X 11 & 1 MG X 42 tablet Take one 0.5 mg tablet by mouth once daily for 3 days, then increase to one 0.5 mg tablet twice daily for 4 days, then increase to one 1 mg tablet twice daily.  53 tablet  0  . [DISCONTINUED] amoxicillin-clavulanate (AUGMENTIN) 875-125 MG per tablet Take 1 tablet by mouth 2 (two) times daily.  14 tablet  0   No facility-administered encounter medications on file as of 05/22/2013.   BP 134/90  Pulse 106  Temp(Src) 99 F (37.2 C) (Oral)  Wt 176 lb (79.833 kg)  BMI 29.29 kg/m2  SpO2 95%  Review of Systems  Constitutional: Negative for fever, chills, appetite change, fatigue and unexpected weight change.  HENT: Negative for ear pain, congestion, sore throat, trouble swallowing, neck pain, voice change and sinus pressure.   Eyes: Negative for visual disturbance.  Respiratory: Negative for cough, shortness of breath, wheezing and stridor.   Cardiovascular: Negative for chest pain, palpitations and leg swelling.  Gastrointestinal: Negative for nausea, vomiting, abdominal pain, diarrhea, constipation, blood in stool, abdominal distention and anal bleeding.  Genitourinary: Negative for dysuria and flank pain.  Musculoskeletal: Positive for myalgias and arthralgias. Negative for gait problem.  Skin: Negative for  color change and rash.  Neurological: Negative for dizziness and headaches.  Hematological: Negative for adenopathy. Does not bruise/bleed easily.  Psychiatric/Behavioral: Negative for suicidal ideas, sleep disturbance and dysphoric mood. The patient is nervous/anxious.        Objective:   Physical Exam  Constitutional: She is oriented to person, place, and time. She appears well-developed and well-nourished. No distress.  HENT:  Head: Normocephalic and atraumatic.  Right Ear: External ear normal.  Left Ear: External ear normal.  Nose: Nose normal.  Mouth/Throat: Oropharynx is clear  and moist. No oropharyngeal exudate.  Eyes: Conjunctivae are normal. Pupils are equal, round, and reactive to light. Right eye exhibits no discharge. Left eye exhibits no discharge. No scleral icterus.  Neck: Normal range of motion. Neck supple. No tracheal deviation present. No thyromegaly present.  Cardiovascular: Normal rate, regular rhythm, normal heart sounds and intact distal pulses.  Exam reveals no gallop and no friction rub.   No murmur heard. Pulmonary/Chest: Effort normal and breath sounds normal. No accessory muscle usage. Not tachypneic. No respiratory distress. She has no decreased breath sounds. She has no wheezes. She has no rhonchi. She has no rales. She exhibits no tenderness.  Musculoskeletal: Normal range of motion. She exhibits no edema and no tenderness.  Lymphadenopathy:    She has no cervical adenopathy.  Neurological: She is alert and oriented to person, place, and time. No cranial nerve deficit. She exhibits normal muscle tone. Coordination normal.  Skin: Skin is warm and dry. No rash noted. She is not diaphoretic. No erythema. No pallor.  Psychiatric: She has a normal mood and affect. Her behavior is normal. Judgment and thought content normal.          Assessment & Plan:

## 2013-05-22 NOTE — Assessment & Plan Note (Signed)
Will start Chantix. Discussed side effects of this medication. Plan for follow up 3 months.

## 2013-05-22 NOTE — Assessment & Plan Note (Signed)
Continue Cymbalta. Will use Alprazolam prn severe anxiety.

## 2013-05-22 NOTE — Assessment & Plan Note (Signed)
Symptoms improved with Celebrex, Lyrica, Cymbalta, and Flexeril/Robaxin. Will continue. Will follow up with Rheumatology as scheduled.

## 2013-06-28 ENCOUNTER — Other Ambulatory Visit: Payer: Self-pay | Admitting: Internal Medicine

## 2013-09-03 ENCOUNTER — Encounter: Payer: BC Managed Care – PPO | Admitting: Internal Medicine

## 2013-09-09 ENCOUNTER — Encounter: Payer: BC Managed Care – PPO | Admitting: Internal Medicine

## 2013-09-16 ENCOUNTER — Encounter: Payer: BC Managed Care – PPO | Admitting: Internal Medicine

## 2013-09-18 ENCOUNTER — Encounter: Payer: Self-pay | Admitting: *Deleted

## 2013-09-19 ENCOUNTER — Encounter: Payer: Self-pay | Admitting: Internal Medicine

## 2013-09-19 ENCOUNTER — Ambulatory Visit (INDEPENDENT_AMBULATORY_CARE_PROVIDER_SITE_OTHER): Payer: BC Managed Care – PPO | Admitting: Internal Medicine

## 2013-09-19 VITALS — BP 140/90 | HR 97 | Temp 98.7°F | Ht 64.0 in | Wt 173.0 lb

## 2013-09-19 DIAGNOSIS — Z0000 Encounter for general adult medical examination without abnormal findings: Secondary | ICD-10-CM

## 2013-09-19 DIAGNOSIS — M797 Fibromyalgia: Secondary | ICD-10-CM

## 2013-09-19 DIAGNOSIS — IMO0001 Reserved for inherently not codable concepts without codable children: Secondary | ICD-10-CM

## 2013-09-19 DIAGNOSIS — R1011 Right upper quadrant pain: Secondary | ICD-10-CM

## 2013-09-19 DIAGNOSIS — Z1239 Encounter for other screening for malignant neoplasm of breast: Secondary | ICD-10-CM

## 2013-09-19 LAB — CBC WITH DIFFERENTIAL/PLATELET
Eosinophils Absolute: 0.2 10*3/uL (ref 0.0–0.7)
Eosinophils Relative: 2 % (ref 0–5)
Hemoglobin: 13.5 g/dL (ref 12.0–15.0)
Lymphocytes Relative: 29 % (ref 12–46)
Lymphs Abs: 2.2 10*3/uL (ref 0.7–4.0)
MCH: 30.7 pg (ref 26.0–34.0)
MCV: 89.8 fL (ref 78.0–100.0)
Monocytes Relative: 7 % (ref 3–12)
RBC: 4.4 MIL/uL (ref 3.87–5.11)
WBC: 7.8 10*3/uL (ref 4.0–10.5)

## 2013-09-19 MED ORDER — FLUTICASONE-SALMETEROL 100-50 MCG/DOSE IN AEPB: 1.0000 | INHALATION_SPRAY | RESPIRATORY_TRACT | Status: DC | PRN

## 2013-09-19 MED ORDER — VARENICLINE TARTRATE 0.5 MG X 11 & 1 MG X 42 PO MISC: ORAL | Status: DC

## 2013-09-19 MED ORDER — TRAMADOL HCL 50 MG PO TABS: 100.0000 mg | ORAL_TABLET | Freq: Three times a day (TID) | ORAL | Status: DC | PRN

## 2013-09-19 NOTE — Progress Notes (Signed)
Subjective:    Patient ID: Debra Hogan, female    DOB: [birth year], 52 y.o.   MRN: [id number]  HPI 52YO female presents for annual exam. Continues to struggle with pain from fibromyalgia, described as diffuse muscle aches. Symptoms improved with use of Lyrica and Flexeril.   Concerned today about several weeks of intermittent RUQ abdominal pain, which "doubles her over." No specific food or other triggers noted. No nausea, vomiting. Chronic intermittent watery diarrhea unchanged. No fever, chills. No dyspnea, chest pain. In between episodes, feels fine.  Outpatient Encounter Prescriptions as of 09/19/2013  Medication Sig Dispense Refill  . acyclovir (ZOVIRAX) 400 MG tablet Take 1 tablet (400 mg total) by mouth daily.  90 tablet  3  . ALPRAZolam (XANAX) 0.25 MG tablet Take 1 tablet (0.25 mg total) by mouth 3 (three) times daily as needed for sleep or anxiety.  90 tablet  1  . cyclobenzaprine (FLEXERIL) 5 MG tablet TAKE ONE TABLET BY MOUTH AT BEDTIME AS NEEDED FOR MUSCLE SPASM  30 tablet  0  . fluticasone (FLONASE) 50 MCG/ACT nasal spray Place 2 sprays into the nose daily.  16 g  6  . Fluticasone-Salmeterol (ADVAIR) 100-50 MCG/DOSE AEPB Inhale 1 puff into the lungs as needed.  60 each  3  . levothyroxine (SYNTHROID, LEVOTHROID) 50 MCG tablet Take 1 tablet (50 mcg total) by mouth daily.  90 tablet  4  . pregabalin (LYRICA) 150 MG capsule Take 1 capsule (150 mg total) by mouth at bedtime as needed.  30 capsule  3  . traMADol (ULTRAM) 50 MG tablet Take 2 tablets (100 mg total) by mouth every 8 (eight) hours as needed for pain.  90 tablet  3  . varenicline (CHANTIX STARTING MONTH PAK) 0.5 MG X 11 & 1 MG X 42 tablet Take one 0.5 mg tablet by mouth once daily for 3 days, then increase to one 0.5 mg tablet twice daily for 4 days, then increase to one 1 mg tablet twice daily.  53 tablet  0   No facility-administered encounter medications on file as of 09/19/2013.   BP 140/90  Pulse 97  Temp(Src) 98.7 F  (37.1 C) (Oral)  Ht 5\' 4"  (1.626 m)  Wt 173 lb (78.472 kg)  BMI 29.68 kg/m2  SpO2 94%  Review of Systems  Constitutional: Negative for fever, chills, appetite change, fatigue and unexpected weight change.  HENT: Negative for ear pain, congestion, sore throat, trouble swallowing, neck pain, voice change and sinus pressure.   Eyes: Negative for visual disturbance.  Respiratory: Negative for cough, shortness of breath, wheezing and stridor.   Cardiovascular: Negative for chest pain, palpitations and leg swelling.  Gastrointestinal: Positive for abdominal pain. Negative for nausea, vomiting, diarrhea, constipation, blood in stool, abdominal distention and anal bleeding.  Genitourinary: Negative for dysuria and flank pain.  Musculoskeletal: Positive for myalgias, back pain and arthralgias. Negative for gait problem.  Skin: Negative for color change and rash.  Neurological: Negative for dizziness and headaches.  Hematological: Negative for adenopathy. Does not bruise/bleed easily.  Psychiatric/Behavioral: Negative for suicidal ideas, sleep disturbance and dysphoric mood. The patient is not nervous/anxious.        Objective:   Physical Exam  Constitutional: She is oriented to person, place, and time. She appears well-developed and well-nourished. No distress.  HENT:  Head: Normocephalic and atraumatic.  Right Ear: External ear normal.  Left Ear: External ear normal.  Nose: Nose normal.  Mouth/Throat: Oropharynx is clear and moist. No oropharyngeal exudate.  Eyes: Conjunctivae are normal. Pupils are equal, round, and reactive to light. Right eye exhibits no discharge. Left eye exhibits no discharge. No scleral icterus.  Neck: Normal range of motion. Neck supple. No tracheal deviation present. No thyromegaly present.  Cardiovascular: Normal rate, regular rhythm, normal heart sounds and intact distal pulses.  Exam reveals no gallop and no friction rub.   No murmur heard. Pulmonary/Chest:  Effort normal and breath sounds normal. No accessory muscle usage. Not tachypneic. No respiratory distress. She has no decreased breath sounds. She has no wheezes. She has no rhonchi. She has no rales. She exhibits no tenderness. Right breast exhibits no inverted nipple, no mass, no nipple discharge, no skin change and no tenderness. Left breast exhibits no inverted nipple, no mass, no nipple discharge, no skin change and no tenderness. Breasts are symmetrical.  Abdominal: Soft. Bowel sounds are normal. She exhibits no distension and no mass. There is no tenderness. There is no rebound and no guarding.  Genitourinary: Vagina normal. Right adnexum displays no mass and no tenderness. Left adnexum displays no mass and no tenderness.  Vaginal cuff present. Uterus surgically absent.  Musculoskeletal: Normal range of motion. She exhibits no edema and no tenderness.  Lymphadenopathy:    She has no cervical adenopathy.  Neurological: She is alert and oriented to person, place, and time. No cranial nerve deficit. She exhibits normal muscle tone. Coordination normal.  Skin: Skin is warm and dry. No rash noted. She is not diaphoretic. No erythema. No pallor.  Psychiatric: She has a normal mood and affect. Her behavior is normal. Judgment and thought content normal.          Assessment & Plan:

## 2013-09-19 NOTE — Assessment & Plan Note (Signed)
General medical exam normal today including breast and pelvic exam. PAP pending. Will check basic labs including CBC, CMP, lipids, TSH. Encouraged healthy diet and regular physical activity. Immunizations UTD. Follow up 4 weeks regarding abdominal pain.

## 2013-09-19 NOTE — Assessment & Plan Note (Signed)
Symptoms slightly improved with Lyrica. Continue to follow with rheumatology.

## 2013-09-19 NOTE — Assessment & Plan Note (Signed)
Intermittent severe RUQ abdominal pain. Suspect cholecystitis. Will check LFTs with labs. Will get RUQ [place] for evaluation.

## 2013-09-20 LAB — LIPID PANEL
HDL: 42 mg/dL (ref >39)
LDL Cholesterol: 136 mg/dL — ABNORMAL HIGH (ref 0–99)
VLDL: 54 mg/dL — ABNORMAL HIGH (ref 0–40)

## 2013-09-20 LAB — COMPREHENSIVE METABOLIC PANEL
Alkaline Phosphatase: 75 U/L (ref 39–117)
BUN: 9 mg/dL (ref 6–23)
CO2: 28 mEq/L (ref 19–32)
Creat: 0.75 mg/dL (ref 0.50–1.10)
Glucose, Bld: 61 mg/dL — ABNORMAL LOW (ref 70–99)
Total Bilirubin: 0.4 mg/dL (ref 0.3–1.2)

## 2013-09-20 LAB — VITAMIN D 25 HYDROXY (VIT D DEFICIENCY, FRACTURES): Vit D, 25-Hydroxy: 44 ng/mL (ref 30–89)

## 2013-09-22 ENCOUNTER — Other Ambulatory Visit (HOSPITAL_COMMUNITY)
Admission: RE | Admit: 2013-09-22 | Discharge: 2013-09-22 | Disposition: A | Discharge status: Home / Self Care | Payer: BC Managed Care – PPO | Source: Ambulatory Visit | Attending: Internal Medicine | Admitting: Internal Medicine

## 2013-09-22 DIAGNOSIS — Z01419 Encounter for gynecological examination (general) (routine) without abnormal findings: Secondary | ICD-10-CM | POA: Insufficient documentation

## 2013-09-22 DIAGNOSIS — Z1151 Encounter for screening for human papillomavirus (HPV): Secondary | ICD-10-CM | POA: Insufficient documentation

## 2013-09-22 NOTE — Addendum Note (Signed)
Addended by: Montine Circle D on: 09/22/2013 08:06 AM   Modules accepted: Orders

## 2013-09-23 ENCOUNTER — Telehealth: Payer: Self-pay | Admitting: Internal Medicine

## 2013-09-23 NOTE — Telephone Encounter (Signed)
Asking for results from 9/26

## 2013-09-24 ENCOUNTER — Ambulatory Visit: Payer: Self-pay | Admitting: Internal Medicine

## 2013-09-26 ENCOUNTER — Telehealth: Payer: Self-pay | Admitting: Internal Medicine

## 2013-09-26 NOTE — Telephone Encounter (Signed)
Ultrasound of the gallbladder was normal. 

## 2013-09-26 NOTE — Telephone Encounter (Signed)
Left message to call back  

## 2013-09-29 ENCOUNTER — Other Ambulatory Visit: Payer: Self-pay | Admitting: Internal Medicine

## 2013-09-30 NOTE — Telephone Encounter (Signed)
Patient informed results were sent to her via Mychart.

## 2013-10-02 ENCOUNTER — Telehealth: Payer: Self-pay | Admitting: *Deleted

## 2013-10-02 MED ORDER — CYCLOBENZAPRINE HCL 5 MG PO TABS: 5.0000 mg | ORAL_TABLET | Freq: Every evening | ORAL | Status: DC | PRN

## 2013-10-02 NOTE — Telephone Encounter (Signed)
Patient completed survey and had this to follow up on   Patient Questionnaire Submission -------------------------------- Questionnaire: Questionnaire Question: How are you feeling after your recent visit? Answer: In a great deal of pain. Question: Does the recommended course of treatment seem to be helping your symptoms? Answer: Not yet Question: Are you experiencing any side effects from your recommended treatment? Answer: None Question: is there anything else you would like to ask your physician? Answer: Stronger pain meds for my fibromyalgia. Also, what steps we will take next concerning the pain around my lower rib cage area. Since it is not my gall bladder, what's next?

## 2013-10-02 NOTE — Telephone Encounter (Signed)
rx sent if for flexeril.

## 2013-10-04 NOTE — Telephone Encounter (Signed)
I spoke to Debra Hogan (10/03/13 - 1715) after reviewing her questionnaire.  She informed me that she was still having the RUQ pain and now also some lower abdominal pain as well.  She is able to eat and reports no nausea or vomiting.  Persistent increased pain.  Discussed further w/up with her.  Offered eval at [facility] [place].  States she preferred to stay in town.  She informed me that if her "pain was as bad as last night, she would go to the ER".  We discussed ER eval and the capability of CT scan, etc if needed.  Explained I would forward this information to you.

## 2013-10-06 ENCOUNTER — Telehealth: Payer: Self-pay | Admitting: *Deleted

## 2013-10-06 NOTE — Telephone Encounter (Signed)
Patient came in today c/o abdominal pain and pain in her lower right side of her back. Lying down seems to make it worse has went through the entire weekend with this pain. Spent about 10 minutes in the room with her trying to get her to go to the ED per Dr. Lorin Picket instructions on Friday. Patient declined going to the ED because she said it is too expensive. Advised her this sounds like something that needs to be evaluated and she should go to the ED today since it has continued throughout the weekend. Also stated the pain medication does not really help, it just takes the edge off a little bit and she did go to work today. Patient would like to be seen by in the office tomorrow, told her I would pass this message on to Dr. Dan Humphreys and give her a call back in the morning.

## 2013-10-06 NOTE — Telephone Encounter (Signed)
Unfortunately, I am in meetings tomorrow morning. I could add her at 6:30pm tomorrow night, however if she needs imaging or labs, we won't be able to set up until Wednesday.

## 2013-10-07 NOTE — Telephone Encounter (Signed)
Left message to call back, if I am not available please put patient in for 630 today.

## 2013-10-09 NOTE — Telephone Encounter (Signed)
Left message to call back  

## 2013-10-10 NOTE — Telephone Encounter (Signed)
Patient returned call and stated the doctor in [place] increased the Lyrica to twice a day. So far she has not seen any improvement and she is still in pain. But she will discuss further when she comes in on Tuesday for her follow up appointment with Dr. Dan Humphreys.

## 2013-10-13 ENCOUNTER — Encounter: Payer: Self-pay | Admitting: *Deleted

## 2013-10-13 ENCOUNTER — Encounter: Payer: Self-pay | Admitting: Internal Medicine

## 2013-10-14 ENCOUNTER — Encounter: Payer: Self-pay | Admitting: Internal Medicine

## 2013-10-14 ENCOUNTER — Ambulatory Visit (INDEPENDENT_AMBULATORY_CARE_PROVIDER_SITE_OTHER): Payer: BC Managed Care – PPO | Admitting: Internal Medicine

## 2013-10-14 VITALS — BP 120/84 | HR 105 | Temp 98.5°F | Wt 172.0 lb

## 2013-10-14 DIAGNOSIS — M797 Fibromyalgia: Secondary | ICD-10-CM

## 2013-10-14 DIAGNOSIS — R1032 Left lower quadrant pain: Secondary | ICD-10-CM | POA: Insufficient documentation

## 2013-10-14 DIAGNOSIS — IMO0001 Reserved for inherently not codable concepts without codable children: Secondary | ICD-10-CM

## 2013-10-14 DIAGNOSIS — R1011 Right upper quadrant pain: Secondary | ICD-10-CM

## 2013-10-14 MED ORDER — HYDROCODONE-ACETAMINOPHEN 5-325 MG PO TABS: 1.0000 | ORAL_TABLET | Freq: Three times a day (TID) | ORAL | Status: DC | PRN

## 2013-10-14 MED ORDER — PREGABALIN 75 MG PO CAPS: ORAL_CAPSULE | ORAL | Status: DC

## 2013-10-14 NOTE — Assessment & Plan Note (Addendum)
1 week of severe, intermittent, left lower abdominal pain. Chronic diarrhea, no recent change, no hematochezia. Question ovarian pathology versus less likely diverticulitis. Will get CT abdomen/pelvis with contrast for further evaluation. Given intensity of pain (10/10) despite use of NSAIDS, tramadol, Lyrica, will try using Hydrocodone for the short term with goal of reducing pain level <5/10. We discussed that this will not be a long term medication.

## 2013-10-14 NOTE — Assessment & Plan Note (Signed)
Severe, persistent diffuse pain. No improvement with Tylenol, NSAIDS, Cymbalta, Tramadol. Now taking Robaxin and Lyrica, as prescribed by rheumatologist with minimal improvement. Will set up referral to pain management. Question if medication such as Nucynta might be helpful for her.

## 2013-10-14 NOTE — Assessment & Plan Note (Signed)
Symptoms have improved since last visit. RUQ [place] was normal, reviewed with pt today. Suspect pain was related to fibromyalgia. Will continue to monitor.

## 2013-10-14 NOTE — Progress Notes (Signed)
Subjective:    Patient ID: Debra Hogan, female    DOB: Michalowski 10, 1962, 52 y.o.   MRN: [id number]  HPI 52 year old female with history of fibromyalgia presents for followup. In the interim since her last visit, she was seen by her rheumatologist to increased her dose of Lyrica. She reports minimal improvement with this. She continues to have diffuse severe pain which limits her ability to function and sleep. She, increase dose of Lyrica made her feel more drowsy. She has now reduced her dose back to 75 mg in the morning and 150 mg at night. She reports that the right upper quadrant abdominal pain that she complained of at her last visit has improved. Right upper quadrant ultrasound was normal. Over the last week she has developed intermittent severe left lower abdominal pain which at times "doubles her over "making it difficult for her to walk. She denies any change in bowel habits. She does have chronic diarrhea. She denies blood in her stool. She denies any nausea or vomiting. She has a normal appetite. She denies fever or chills.  Outpatient Encounter Prescriptions as of 10/14/2013  Medication Sig Dispense Refill  . acyclovir (ZOVIRAX) 400 MG tablet Take 1 tablet (400 mg total) by mouth daily.  90 tablet  3  . ALPRAZolam (XANAX) 0.25 MG tablet Take 1 tablet (0.25 mg total) by mouth 3 (three) times daily as needed for sleep or anxiety.  90 tablet  1  . cyclobenzaprine (FLEXERIL) 5 MG tablet Take 1 tablet (5 mg total) by mouth at bedtime as needed for muscle spasms.  30 tablet  0  . Fluticasone-Salmeterol (ADVAIR) 100-50 MCG/DOSE AEPB Inhale 1 puff into the lungs as needed.  60 each  3  . levothyroxine (SYNTHROID, LEVOTHROID) 50 MCG tablet Take 1 tablet (50 mcg total) by mouth daily.  90 tablet  4  . pregabalin (LYRICA) 75 MG capsule 75mg  po qam and 150mg  po qpm  90 capsule  3  . varenicline (CHANTIX STARTING MONTH PAK) 0.5 MG X 11 & 1 MG X 42 tablet Take one 0.5 mg tablet by mouth once daily for 3 days,  then increase to one 0.5 mg tablet twice daily for 4 days, then increase to one 1 mg tablet twice daily.  53 tablet  0  . fluticasone (FLONASE) 50 MCG/ACT nasal spray Place 2 sprays into the nose daily.  16 g  6  . HYDROcodone-acetaminophen (NORCO/VICODIN) 5-325 MG per tablet Take 1-2 tablets by mouth every 8 (eight) hours as needed for pain.  60 tablet  0  . Vitamin D, Ergocalciferol, (DRISDOL) 50000 UNITS CAPS capsule        No facility-administered encounter medications on file as of 10/14/2013.   BP 120/84  Pulse 105  Temp(Src) 98.5 F (36.9 C) (Oral)  Wt 172 lb (78.019 kg)  BMI 29.51 kg/m2  SpO2 95%  Review of Systems  Constitutional: Positive for fatigue. Negative for fever, chills, appetite change and unexpected weight change.  HENT: Negative for congestion, ear pain, sinus pressure, sore throat, trouble swallowing and voice change.   Eyes: Negative for visual disturbance.  Respiratory: Negative for cough, shortness of breath, wheezing and stridor.   Cardiovascular: Negative for chest pain, palpitations and leg swelling.  Gastrointestinal: Positive for abdominal pain and diarrhea. Negative for nausea, vomiting, constipation, blood in stool, abdominal distention and anal bleeding.  Genitourinary: Negative for dysuria and flank pain.  Musculoskeletal: Positive for arthralgias, back pain and myalgias. Negative for gait problem and neck pain.  Skin: Negative for color change and rash.  Neurological: Negative for dizziness and headaches.  Hematological: Negative for adenopathy. Does not bruise/bleed easily.  Psychiatric/Behavioral: Positive for sleep disturbance. Negative for suicidal ideas and dysphoric mood. The patient is not nervous/anxious.        Objective:   Physical Exam  Constitutional: She is oriented to person, place, and time. She appears well-developed and well-nourished. No distress.  HENT:  Head: Normocephalic and atraumatic.  Right Ear: External ear normal.   Left Ear: External ear normal.  Nose: Nose normal.  Mouth/Throat: Oropharynx is clear and moist. No oropharyngeal exudate.  Eyes: Conjunctivae are normal. Pupils are equal, round, and reactive to light. Right eye exhibits no discharge. Left eye exhibits no discharge. No scleral icterus.  Neck: Normal range of motion. Neck supple. No tracheal deviation present. No thyromegaly present.  Cardiovascular: Normal rate, regular rhythm, normal heart sounds and intact distal pulses.  Exam reveals no gallop and no friction rub.   No murmur heard. Pulmonary/Chest: Effort normal and breath sounds normal. No accessory muscle usage. Not tachypneic. No respiratory distress. She has no decreased breath sounds. She has no wheezes. She has no rhonchi. She has no rales. She exhibits no tenderness.  Abdominal: Soft. Bowel sounds are normal. She exhibits distension (mild diffuse). She exhibits no mass. There is tenderness (diffuse, however most intense, left lower quadrant). There is no rebound and no guarding.  Musculoskeletal: Normal range of motion. She exhibits no edema and no tenderness.  Lymphadenopathy:    She has no cervical adenopathy.  Neurological: She is alert and oriented to person, place, and time. No cranial nerve deficit. She exhibits normal muscle tone. Coordination normal.  Skin: Skin is warm and dry. No rash noted. She is not diaphoretic. No erythema. No pallor.  Psychiatric: She has a normal mood and affect. Her behavior is normal. Judgment and thought content normal.          Assessment & Plan:

## 2013-10-17 ENCOUNTER — Ambulatory Visit: Payer: Self-pay | Admitting: Internal Medicine

## 2013-10-17 ENCOUNTER — Encounter: Payer: Self-pay | Admitting: *Deleted

## 2013-10-17 ENCOUNTER — Telehealth: Payer: Self-pay | Admitting: Internal Medicine

## 2013-10-17 NOTE — Telephone Encounter (Signed)
CT abdomen was normal. 

## 2013-10-20 ENCOUNTER — Ambulatory Visit: Payer: BC Managed Care – PPO | Admitting: Internal Medicine

## 2013-10-20 NOTE — Telephone Encounter (Signed)
Left message to call back  

## 2013-10-20 NOTE — Telephone Encounter (Signed)
Patient informed of results and verbalized understanding. Cancelled appt for today but she just can not figure out why she is hurting but if she starts hurting again, she will call back .

## 2013-10-28 ENCOUNTER — Other Ambulatory Visit: Payer: Self-pay | Admitting: Internal Medicine

## 2013-10-30 ENCOUNTER — Other Ambulatory Visit: Payer: Self-pay

## 2013-11-02 ENCOUNTER — Other Ambulatory Visit: Payer: Self-pay | Admitting: Internal Medicine

## 2013-11-03 ENCOUNTER — Encounter: Payer: Self-pay | Admitting: Internal Medicine

## 2013-11-03 NOTE — Telephone Encounter (Signed)
Refill

## 2013-11-12 ENCOUNTER — Telehealth: Payer: Self-pay | Admitting: Internal Medicine

## 2013-11-12 NOTE — Telephone Encounter (Signed)
Pt dropped off [employer] paperwork to be filled out.  Pt stated she needed ASAP  In box

## 2013-11-14 NOTE — Telephone Encounter (Signed)
Left message for patient to call the office back or bring in the attached forms for Dr. Dan Humphreys to complete.

## 2013-11-18 NOTE — Telephone Encounter (Signed)
Patient came in yesterday picked up form and stated she would bring it back with the additional requested information.

## 2013-11-26 ENCOUNTER — Encounter: Payer: Self-pay | Admitting: Emergency Medicine

## 2013-12-09 ENCOUNTER — Ambulatory Visit (INDEPENDENT_AMBULATORY_CARE_PROVIDER_SITE_OTHER): Payer: BC Managed Care – PPO | Admitting: Internal Medicine

## 2013-12-09 ENCOUNTER — Encounter: Payer: Self-pay | Admitting: Internal Medicine

## 2013-12-09 VITALS — BP 120/94 | HR 94 | Temp 98.1°F | Wt 173.0 lb

## 2013-12-09 DIAGNOSIS — IMO0001 Reserved for inherently not codable concepts without codable children: Secondary | ICD-10-CM

## 2013-12-09 DIAGNOSIS — R1011 Right upper quadrant pain: Secondary | ICD-10-CM

## 2013-12-09 DIAGNOSIS — R609 Edema, unspecified: Secondary | ICD-10-CM

## 2013-12-09 DIAGNOSIS — M797 Fibromyalgia: Secondary | ICD-10-CM

## 2013-12-09 MED ORDER — HYDROCODONE-ACETAMINOPHEN 5-325 MG PO TABS: 1.0000 | ORAL_TABLET | Freq: Three times a day (TID) | ORAL | Status: DC | PRN

## 2013-12-09 MED ORDER — HYOSCYAMINE SULFATE 0.125 MG SL SUBL: 0.1250 mg | SUBLINGUAL_TABLET | Freq: Four times a day (QID) | SUBLINGUAL | Status: DC | PRN

## 2013-12-09 MED ORDER — PREGABALIN 75 MG PO CAPS: ORAL_CAPSULE | ORAL | Status: DC

## 2013-12-09 MED ORDER — FLUTICASONE-SALMETEROL 100-50 MCG/DOSE IN AEPB: 1.0000 | INHALATION_SPRAY | RESPIRATORY_TRACT | Status: DC | PRN

## 2013-12-09 NOTE — Progress Notes (Signed)
Pre-visit discussion using our clinic review tool. No additional management support is needed unless otherwise documented below in the visit note.  

## 2013-12-10 NOTE — Assessment & Plan Note (Signed)
Persistent symptoms of diffuse pain secondary to fibromyalgia. Referral to pain management has been placed and is scheduled for this week. We'll continue tramadol and hydrocodone, refills given today. We discussed that narcotics are not generally beneficial for long-term pain management in the setting of fibromyalgia. Encouraged her to followup with pain management as scheduled.

## 2013-12-10 NOTE — Assessment & Plan Note (Signed)
Patient continues to have diffuse right-sided abdominal pain. Right upper quadrant ultrasound was normal. Suspect pain is related to fibromyalgia. She had significant improvement with hydrocodone however we discussed that this is not warranted for long-term pain management. Will try Levsin to help control abdominal cramping. She will need to followup at the pain clinic as scheduled this week.

## 2013-12-10 NOTE — Progress Notes (Signed)
Subjective:    Patient ID: Debra Hogan, female    DOB: Mar 23, 1961, 52 y.o.   MRN: [id number]  HPI 52 year old female with history of fibromyalgia and recent episodes of severe right upper quadrant abdominal pain presents for followup. She continues to have right upper quadrant abdominal pain that radiates down across her abdomen. It is not associated with food intake or movement. Pain is occasionally described as cramping. She does have chronic watery diarrhea but this does not seem to relate to pain. She denies fever or chills. Over the last few weeks she has been using tramadol hydrocodone with significant improvement in pain symptoms. She also continues to have diffuse pain in joints and muscles consistent with her known history of fibromyalgia. This too is somewhat improved with use of tramadol and hydrocodone. She was scheduled for evaluation at pain management and this is pending for this week.  She is also concerned today about swelling in her lower legs. This is most pronounced after standing for a long period of time. She denies persistent swelling, chest pain, dyspnea.   Outpatient Encounter Prescriptions as of 12/09/2013  Medication Sig  . acyclovir (ZOVIRAX) 400 MG tablet Take 1 tablet (400 mg total) by mouth daily.  Marland Kitchen ALPRAZolam (XANAX) 0.25 MG tablet Take 1 tablet (0.25 mg total) by mouth 3 (three) times daily as needed for sleep or anxiety.  . CHANTIX STARTING MONTH PAK 0.5 MG X 11 & 1 MG X 42 tablet TAKE AS DIRECTED PER PACKAGE INSTRUCTIONS  . cyclobenzaprine (FLEXERIL) 5 MG tablet TAKE ONE TABLET BY MOUTH AT BEDTIME AS NEEDED FOR MUSCLE SPASM  . Fluticasone-Salmeterol (ADVAIR) 100-50 MCG/DOSE AEPB Inhale 1 puff into the lungs as needed.  Marland Kitchen HYDROcodone-acetaminophen (NORCO/VICODIN) 5-325 MG per tablet Take 1-2 tablets by mouth every 8 (eight) hours as needed.  Marland Kitchen levothyroxine (SYNTHROID, LEVOTHROID) 50 MCG tablet Take 1 tablet (50 mcg total) by mouth daily.  . pregabalin (LYRICA)  75 MG capsule 75mg  po qam and 150mg  po qpm  . traMADol (ULTRAM) 50 MG tablet   . fluticasone (FLONASE) 50 MCG/ACT nasal spray Place 2 sprays into the nose daily.  . hyoscyamine (LEVSIN SL) 0.125 MG SL tablet Place 1 tablet (0.125 mg total) under the tongue every 6 (six) hours as needed.   BP 120/94  Pulse 94  Temp(Src) 98.1 F (36.7 C) (Oral)  Wt 173 lb (78.472 kg)  SpO2 92%  Review of Systems  Constitutional: Positive for fatigue. Negative for fever, chills, appetite change and unexpected weight change.  HENT: Negative for congestion, ear pain, sinus pressure, sore throat, trouble swallowing and voice change.   Eyes: Negative for visual disturbance.  Respiratory: Negative for cough, shortness of breath, wheezing and stridor.   Cardiovascular: Positive for leg swelling. Negative for chest pain and palpitations.  Gastrointestinal: Positive for abdominal pain and diarrhea. Negative for nausea, vomiting, constipation, blood in stool, abdominal distention and anal bleeding.  Genitourinary: Negative for dysuria and flank pain.  Musculoskeletal: Positive for arthralgias, back pain and myalgias. Negative for gait problem and neck pain.  Skin: Negative for color change and rash.  Neurological: Negative for dizziness and headaches.  Hematological: Negative for adenopathy. Does not bruise/bleed easily.  Psychiatric/Behavioral: Negative for suicidal ideas, sleep disturbance and dysphoric mood. The patient is not nervous/anxious.        Objective:   Physical Exam  Constitutional: She is oriented to person, place, and time. She appears well-developed and well-nourished. No distress.  HENT:  Head: Normocephalic and atraumatic.  Right Ear: External ear normal.  Left Ear: External ear normal.  Nose: Nose normal.  Mouth/Throat: Oropharynx is clear and moist. No oropharyngeal exudate.  Eyes: Conjunctivae are normal. Pupils are equal, round, and reactive to light. Right eye exhibits no discharge.  Left eye exhibits no discharge. No scleral icterus.  Neck: Normal range of motion. Neck supple. No tracheal deviation present. No thyromegaly present.  Cardiovascular: Normal rate, regular rhythm, normal heart sounds and intact distal pulses.  Exam reveals no gallop and no friction rub.   No murmur heard. Pulmonary/Chest: Effort normal and breath sounds normal. No accessory muscle usage. Not tachypneic. No respiratory distress. She has no decreased breath sounds. She has no wheezes. She has no rhonchi. She has no rales. She exhibits no tenderness.  Abdominal: Soft. Bowel sounds are normal. She exhibits no distension and no mass. There is tenderness (diffuse). There is no rebound and no guarding.  Musculoskeletal: Normal range of motion. She exhibits edema (1+pitting to mid shin). She exhibits no tenderness.  Lymphadenopathy:    She has no cervical adenopathy.  Neurological: She is alert and oriented to person, place, and time. No cranial nerve deficit. She exhibits normal muscle tone. Coordination normal.  Skin: Skin is warm and dry. No rash noted. She is not diaphoretic. No erythema. No pallor.  Psychiatric: She has a normal mood and affect. Her behavior is normal. Judgment and thought content normal.          Assessment & Plan:

## 2013-12-10 NOTE — Assessment & Plan Note (Signed)
Bilateral lower extremity edema after prolonged standing. Consistent with chronic venous insufficiency. Encouraged use of compression stockings. Prescription for this given today.

## 2014-01-19 ENCOUNTER — Ambulatory Visit: Payer: Self-pay | Admitting: Pain Medicine

## 2014-01-30 ENCOUNTER — Telehealth: Payer: Self-pay | Admitting: Internal Medicine

## 2014-01-30 NOTE — Telephone Encounter (Signed)
Lupita Leash stated she put paperwork in your box this morning

## 2014-01-30 NOTE — Telephone Encounter (Signed)
I have not seen this paperwork.  

## 2014-01-30 NOTE — Telephone Encounter (Signed)
We need to confirm with pt, what she is asking for in terms of FMLA? Does she need time off or reduced hours because of chronic pain?

## 2014-01-30 NOTE — Telephone Encounter (Signed)
Fwd to Dr. Walker 

## 2014-01-30 NOTE — Telephone Encounter (Signed)
Pt dropping off [employer] paperwork.  Placed in Dr. Tilman Neat box.  Pt asking for call, states she will pick up when ready.

## 2014-02-02 NOTE — Telephone Encounter (Signed)
Left message to call back  

## 2014-02-02 NOTE — Telephone Encounter (Signed)
Because of chronic pain?

## 2014-02-02 NOTE — Telephone Encounter (Signed)
Patient needs to let us know exactly what she is requesting for completion of FMLA forms; is it time off or limited work hours

## 2014-02-02 NOTE — Telephone Encounter (Signed)
Patient returned call, per patient she would like for it to indicate there will be days she can not come in at all and days she Talbot be late getting there.

## 2014-02-03 NOTE — Telephone Encounter (Signed)
Yes, that is correct 

## 2014-02-12 DIAGNOSIS — Z0279 Encounter for issue of other medical certificate: Secondary | ICD-10-CM

## 2014-02-23 ENCOUNTER — Other Ambulatory Visit: Payer: Self-pay | Admitting: Pain Medicine

## 2014-02-26 NOTE — Telephone Encounter (Signed)
Pt calling to check status of FMLA paperwork.  [name] leave msg.

## 2014-02-27 NOTE — Telephone Encounter (Signed)
I have filled this out.  Okey Regal - Do you know where her paperwork is?

## 2014-03-02 ENCOUNTER — Other Ambulatory Visit: Payer: Self-pay | Admitting: *Deleted

## 2014-03-02 DIAGNOSIS — A6000 Herpesviral infection of urogenital system, unspecified: Secondary | ICD-10-CM

## 2014-03-02 MED ORDER — ACYCLOVIR 400 MG PO TABS: 400.0000 mg | ORAL_TABLET | Freq: Every day | ORAL | Status: DC

## 2014-03-02 NOTE — Telephone Encounter (Signed)
Called spoke with patient, informed her paperwork has been completed and ready for pick up. While on the phone, patient stated she was in a lot of pain today and would like to be seen. No openings on Dr. Dan Humphreys or Raquel schedule until Wed. Offered an appointment Wednesday with Raquel, she declined. If pain is that bad she would go ahead to the ED, if she decided not to go to ED then call back early Wednesday morning to see if that appointment is still available.

## 2014-03-02 NOTE — Telephone Encounter (Signed)
Ok refill? 

## 2014-03-06 ENCOUNTER — Other Ambulatory Visit: Payer: Self-pay | Admitting: *Deleted

## 2014-03-06 ENCOUNTER — Ambulatory Visit: Payer: BC Managed Care – PPO | Admitting: Internal Medicine

## 2014-03-06 ENCOUNTER — Telehealth: Payer: Self-pay | Admitting: Internal Medicine

## 2014-03-06 DIAGNOSIS — M797 Fibromyalgia: Secondary | ICD-10-CM

## 2014-03-06 MED ORDER — PREGABALIN 75 MG PO CAPS: ORAL_CAPSULE | ORAL | Status: DC

## 2014-03-06 NOTE — Telephone Encounter (Signed)
Ok refill? 

## 2014-03-06 NOTE — Telephone Encounter (Signed)
pregabalin (LYRICA) 75 MG capsule  Call patient when it has been called to the pharmacy

## 2014-03-09 NOTE — Telephone Encounter (Signed)
[facility]

## 2014-03-10 ENCOUNTER — Ambulatory Visit: Payer: BC Managed Care – PPO | Admitting: Internal Medicine

## 2014-03-18 ENCOUNTER — Ambulatory Visit: Payer: Self-pay | Admitting: Pain Medicine

## 2014-04-01 ENCOUNTER — Ambulatory Visit: Payer: Self-pay | Admitting: Pain Medicine

## 2014-04-15 ENCOUNTER — Ambulatory Visit: Payer: Self-pay | Admitting: Pain Medicine

## 2014-05-11 ENCOUNTER — Ambulatory Visit: Payer: Self-pay | Admitting: Pain Medicine

## 2014-06-01 ENCOUNTER — Ambulatory Visit: Payer: Self-pay | Admitting: Neurology

## 2014-06-01 LAB — GLUCOSE, RANDOM: Glucose: 132 mg/dL — ABNORMAL HIGH (ref 65–99)

## 2014-06-01 LAB — PLATELET COUNT: Platelet: 196 10*3/uL (ref 150–440)

## 2014-06-01 LAB — CSF CELL COUNT WITH DIFFERENTIAL
CSF Tube #: 2
Eosinophil: 0 %
Lymphocytes: 92 %
Monocytes/Macrophages: 8 %
Neutrophils: 0 %
OTHER CELLS: 0 %
RBC (CSF): 3 /mm3
WBC (CSF): 26 /mm3

## 2014-06-01 LAB — PROTIME-INR
INR: 0.9
Prothrombin Time: 12.1 secs (ref 11.5–14.7)

## 2014-06-01 LAB — GLUCOSE, CSF: GLUCOSE, CSF: 57 mg/dL (ref 40–75)

## 2014-06-01 LAB — PROTEIN, CSF: Protein, CSF: 53 mg/dL — ABNORMAL HIGH (ref 15–45)

## 2014-06-11 ENCOUNTER — Ambulatory Visit: Payer: Self-pay | Admitting: Neurology

## 2014-06-15 ENCOUNTER — Ambulatory Visit: Payer: Self-pay | Admitting: Pain Medicine

## 2014-10-12 ENCOUNTER — Ambulatory Visit: Payer: Self-pay | Admitting: Pain Medicine

## 2014-12-03 ENCOUNTER — Encounter: Payer: Self-pay | Admitting: Internal Medicine

## 2014-12-03 ENCOUNTER — Ambulatory Visit (INDEPENDENT_AMBULATORY_CARE_PROVIDER_SITE_OTHER): Payer: BC Managed Care – PPO | Admitting: Internal Medicine

## 2014-12-03 VITALS — BP 124/80 | HR 104 | Temp 98.6°F | Resp 12 | Ht 64.0 in | Wt 175.5 lb

## 2014-12-03 DIAGNOSIS — G35 Multiple sclerosis: Secondary | ICD-10-CM

## 2014-12-03 DIAGNOSIS — F411 Generalized anxiety disorder: Secondary | ICD-10-CM | POA: Insufficient documentation

## 2014-12-03 DIAGNOSIS — R55 Syncope and collapse: Secondary | ICD-10-CM | POA: Insufficient documentation

## 2014-12-03 MED ORDER — ALPRAZOLAM 0.25 MG PO TABS: 0.2500 mg | ORAL_TABLET | Freq: Three times a day (TID) | ORAL | Status: DC | PRN

## 2014-12-03 NOTE — Progress Notes (Signed)
Pre visit review using our clinic review tool, if applicable. No additional management support is needed unless otherwise documented below in the visit note. 

## 2014-12-03 NOTE — Assessment & Plan Note (Signed)
Anxiety worsened with recent diagnosis and caring for her mother and husband. Encouraged her to take time for herself. Will refill Alprazolam prn.

## 2014-12-03 NOTE — Assessment & Plan Note (Signed)
Two recent episodes of syncope. Cardiology evaluation and EEG pending. Reviewed neurology notes and labs from 05/2014. Strongly encouraged her not to drive until evaluation complete.

## 2014-12-03 NOTE — Assessment & Plan Note (Signed)
Reviewed notes from neurology regarding recent diagnosis of MS and workup including imaging and lumbar puncture. Filled out FMLA paperwork for her to have time off as needed both during current evaluation for syncope and with MS flares. She will need to talk with her neurologist about expected intervals of MS flares and time needed off from work.

## 2014-12-03 NOTE — Patient Instructions (Addendum)
Follow up with Dr. Cassie Freer in cardiology for evaluation.  Follow up with Dr. Sherryll Burger for EEG.  Do not drive until evaluation complete.  Follow up here after EEG complete or sooner as needed.

## 2014-12-03 NOTE — Progress Notes (Signed)
Subjective:    Patient ID: Debra Hogan, female    DOB: Mar 19, 1961, 53 y.o.   MRN: [id number]  HPI 53YO female presents for follow up. She has not been seen in 1 year.  Pain management physician did MRI brain in 05/2014 which suggested MS. Neurologist did a lumbar puncture which confirmed MS. Started on Tecfidera.  Symptoms have been worsening with difficulty swallowing, slurred speech, recurrent falls and syncopal episodes. Two syncopal episodes in 2 weeks. No observed seizure activity. Both episodes occurred at work. No chest pain, palpitations. No preceding symptoms with syncope. No symptoms after these events. Neurologist was concerned syncope was not related to MS. EEG is pending. Neurologist has taken her out of work for 1 week.  Cardiology evaluation pending for later this month with Dr. Cassie Freer.  Anxiety has been much worse recently. Out of Alprazolam. Would like to restart. Caring for elderly mother and husband is currently hospitalized.   Past medical, surgical, family and social history per today's encounter.  Review of Systems  Constitutional: Negative for fever, chills, appetite change, fatigue and unexpected weight change.  HENT: Positive for trouble swallowing.   Eyes: Negative for visual disturbance.  Respiratory: Negative for shortness of breath.   Cardiovascular: Negative for chest pain and leg swelling.  Gastrointestinal: Negative for abdominal pain.  Musculoskeletal: Positive for myalgias and arthralgias.  Skin: Negative for color change and rash.  Neurological: Positive for tremors, syncope, speech difficulty, weakness and light-headedness. Negative for dizziness, facial asymmetry and headaches.  Hematological: Negative for adenopathy. Does not bruise/bleed easily.  Psychiatric/Behavioral: Negative for sleep disturbance and dysphoric mood. The patient is nervous/anxious.        Objective:    BP 124/80 mmHg  Pulse 104  Temp(Src) 98.6 F (37 C) (Oral)   Resp 12  Ht 5\' 4"  (1.626 m)  Wt 175 lb 8 oz (79.606 kg)  BMI 30.11 kg/m2  SpO2 96% Physical Exam  Constitutional: She is oriented to person, place, and time. She appears well-developed and well-nourished. No distress.  HENT:  Head: Normocephalic and atraumatic.  Right Ear: External ear normal.  Left Ear: External ear normal.  Nose: Nose normal.  Mouth/Throat: Oropharynx is clear and moist. No oropharyngeal exudate.  Eyes: Conjunctivae are normal. Pupils are equal, round, and reactive to light. Right eye exhibits no discharge. Left eye exhibits no discharge. No scleral icterus.  Neck: Normal range of motion. Neck supple. No tracheal deviation present. No thyromegaly present.  Cardiovascular: Normal rate, regular rhythm, normal heart sounds and intact distal pulses.  Exam reveals no gallop and no friction rub.   No murmur heard. Pulmonary/Chest: Effort normal and breath sounds normal. No accessory muscle usage. No tachypnea. No respiratory distress. She has no decreased breath sounds. She has no wheezes. She has no rhonchi. She has no rales. She exhibits no tenderness.  Musculoskeletal: Normal range of motion. She exhibits no edema or tenderness.  Lymphadenopathy:    She has no cervical adenopathy.  Neurological: She is alert and oriented to person, place, and time. No cranial nerve deficit. She exhibits normal muscle tone. Coordination normal.  Skin: Skin is warm and dry. No rash noted. She is not diaphoretic. No erythema. No pallor.  Psychiatric: Her behavior is normal. Judgment and thought content normal. Her mood appears anxious.          Assessment & Plan:  Over of which >50% spent in face-to-face contact with patient discussing plan of care  Problem List Items Addressed This  Visit      Unprioritized   DS (disseminated sclerosis) - Primary    Reviewed notes from neurology regarding recent diagnosis of MS and workup including imaging and lumbar puncture. Filled out FMLA  paperwork for her to have time off as needed both during current evaluation for syncope and with MS flares. She will need to talk with her neurologist about expected intervals of MS flares and time needed off from work.    Relevant Medications      Dimethyl Fumarate (TECFIDERA) 240 MG CPDR   Generalized anxiety disorder    Anxiety worsened with recent diagnosis and caring for her mother and husband. Encouraged her to take time for herself. Will refill Alprazolam prn.    Relevant Medications      ALPRAZolam  (XANAX) tablet   Syncope and collapse    Two recent episodes of syncope. Cardiology evaluation and EEG pending. Reviewed neurology notes and labs from 05/2014. Strongly encouraged her not to drive until evaluation complete.        Return in about 8 weeks (around 01/28/2015) for Recheck.

## 2014-12-04 ENCOUNTER — Telehealth: Payer: Self-pay | Admitting: Internal Medicine

## 2014-12-04 NOTE — Telephone Encounter (Signed)
emmi emailed °

## 2014-12-09 ENCOUNTER — Telehealth: Payer: Self-pay | Admitting: *Deleted

## 2014-12-09 MED ORDER — VARENICLINE TARTRATE 0.5 MG X 11 & 1 MG X 42 PO MISC: ORAL | Status: DC

## 2014-12-09 MED ORDER — FLUTICASONE-SALMETEROL 100-50 MCG/DOSE IN AEPB: 1.0000 | INHALATION_SPRAY | RESPIRATORY_TRACT | Status: DC | PRN

## 2014-12-09 NOTE — Telephone Encounter (Signed)
Pt called requesting refill on chantix and advair, Pt states that you and her had spoke about refilling these at her last appt

## 2014-12-09 NOTE — Telephone Encounter (Signed)
Fine to refill 

## 2014-12-10 ENCOUNTER — Telehealth: Payer: Self-pay | Admitting: *Deleted

## 2014-12-10 ENCOUNTER — Other Ambulatory Visit: Payer: Self-pay | Admitting: *Deleted

## 2014-12-10 MED ORDER — FLUTICASONE PROPIONATE 50 MCG/ACT NA SUSP: 2.0000 | Freq: Every day | NASAL | Status: DC

## 2014-12-10 NOTE — Telephone Encounter (Signed)
[place]ry[place] with [employer] called to request the date patient was placed out of work. Please call and note ref# [id number]

## 2014-12-10 NOTE — Telephone Encounter (Signed)
Chantix Rx faxed

## 2014-12-11 DIAGNOSIS — Z7689 Persons encountering health services in other specified circumstances: Secondary | ICD-10-CM

## 2014-12-11 NOTE — Telephone Encounter (Signed)
That would be 12/10, as that is the first time I have seen her for this issue. Earlier date would have to be per her neurologist.

## 2015-02-05 ENCOUNTER — Encounter: Payer: Self-pay | Admitting: Internal Medicine

## 2015-02-05 ENCOUNTER — Ambulatory Visit (INDEPENDENT_AMBULATORY_CARE_PROVIDER_SITE_OTHER): Payer: Self-pay | Admitting: Internal Medicine

## 2015-02-05 VITALS — BP 120/72 | HR 90 | Resp 16 | Ht 64.0 in | Wt 174.6 lb

## 2015-02-05 DIAGNOSIS — J42 Unspecified chronic bronchitis: Secondary | ICD-10-CM

## 2015-02-05 DIAGNOSIS — Z720 Tobacco use: Secondary | ICD-10-CM

## 2015-02-05 DIAGNOSIS — G35 Multiple sclerosis: Secondary | ICD-10-CM

## 2015-02-05 DIAGNOSIS — J449 Chronic obstructive pulmonary disease, unspecified: Secondary | ICD-10-CM | POA: Insufficient documentation

## 2015-02-05 DIAGNOSIS — R55 Syncope and collapse: Secondary | ICD-10-CM

## 2015-02-05 DIAGNOSIS — M797 Fibromyalgia: Secondary | ICD-10-CM

## 2015-02-05 NOTE — Progress Notes (Signed)
Pre visit review using our clinic review tool, if applicable. No additional management support is needed unless otherwise documented below in the visit note. 

## 2015-02-05 NOTE — Assessment & Plan Note (Signed)
Reviewed recent notes from Neurology with recommendation to start Ampya. Reviewed this medication with pt. Will check renal function with labs. Encouraged her to consider starting this, as it Altemose improve her ability to walk.

## 2015-02-05 NOTE — Progress Notes (Signed)
Subjective:    Patient ID: Shaunte Tuft Welle, female    DOB: [birth year], 54 y.o.   MRN: [id number]  HPI  54YO female presents for followup. Last seen 12/10 after being diagnosed with MS.  Neurology recently started Ampyra, however she has not taken this medication. Has noted progressive weakness in legs. Feels that she [place] fall, but no recent falls.  Had 2 episodes of syncope at work. Had evaluation with CT head, EEG, and Holter monitor which were normal. Pt reports her neurologist has attributed symptoms to stress.  Insurance company has recommended PFTS for COPD. Last PFTs approx 5 years ago.  Has chronic mild dyspnea. Occasional cough. Smoking more recently with increased stress, about 1 PPD.  Past medical, surgical, family and social history per today's encounter.  Review of Systems  Constitutional: Positive for fatigue. Negative for fever, chills, appetite change and unexpected weight change.  Eyes: Negative for visual disturbance.  Respiratory: Positive for cough (chronic) and shortness of breath.   Cardiovascular: Negative for chest pain and leg swelling.  Gastrointestinal: Negative for abdominal pain.  Musculoskeletal: Positive for myalgias, arthralgias and gait problem.  Skin: Negative for color change and rash.  Neurological: Positive for syncope, weakness, light-headedness and headaches. Negative for dizziness.  Hematological: Negative for adenopathy. Does not bruise/bleed easily.  Psychiatric/Behavioral: Negative for dysphoric mood. The patient is not nervous/anxious.        Objective:    BP 120/72 mmHg  Pulse 90  Resp 16  Ht _0  (1.626 m)  Wt 174 lb 9.6 oz (79.198 kg)  BMI 29.96 kg/m2  SpO2 95% Physical Exam  Constitutional: She is oriented to person, place, and time. She appears well-developed and well-nourished. No distress.  HENT:  Head: Normocephalic and atraumatic.  Right Ear: External ear normal.  Left Ear: External ear normal.  Nose: Nose normal.    Mouth/Throat: Oropharynx is clear and moist. No oropharyngeal exudate.  Eyes: Conjunctivae are normal. Pupils are equal, round, and reactive to light. Right eye exhibits no discharge. Left eye exhibits no discharge. No scleral icterus.  Neck: Normal range of motion. Neck supple. No tracheal deviation present. No thyromegaly present.  Cardiovascular: Normal rate, regular rhythm, normal heart sounds and intact distal pulses.  Exam reveals no gallop and no friction rub.   No murmur heard. Pulmonary/Chest: Effort normal and breath sounds normal. No accessory muscle usage. No respiratory distress. She has no decreased breath sounds. She has no wheezes. She has no rhonchi. She has no rales. She exhibits no tenderness.  Musculoskeletal: Normal range of motion. She exhibits no edema or tenderness.  Lymphadenopathy:    She has no cervical adenopathy.  Neurological: She is alert and oriented to person, place, and time. No cranial nerve deficit. She exhibits normal muscle tone. Gait (unsteady) abnormal. Coordination normal.  Skin: Skin is warm and dry. No rash noted. She is not diaphoretic. No erythema. No pallor.  Psychiatric: She has a normal mood and affect. Her behavior is normal. Judgment and thought content normal.          Assessment & Plan:   Problem List Items Addressed This Visit      Unprioritized   COPD, moderate    COPD symptoms have been relatively stable on Advair. Last PFTs over 5 years ago. Will set up evaluation with Pulmonary for repeat PFTs. Encouraged smoking cessation.      DS (disseminated sclerosis) - Primary    Reviewed recent notes from Neurology with recommendation to start [place].  Reviewed this medication with pt. Will check renal function with labs. Encouraged her to consider starting this, as it Mester improve her ability to walk.      Relevant Orders   Comp Met (CMET)   Fibromyalgia    Fibromyalgia. Followed by rheumatology. Will continue prn Tramadol and  Hydrocodone.      Syncope and collapse    Reviewed notes from [facility]. Recent evaluation including EEG, CT head, Holter all unrevealing as to cause of syncope. Suspect that anxiety playing a role. Will continue prn alprazolam. Follow up with neurology as scheduled.      Tobacco abuse    Encouraged smoking cessation, however she feels this is not the best time for her. Will continue to revisit.          Return in about 4 weeks (around 03/05/2015) for Recheck.

## 2015-02-05 NOTE — Assessment & Plan Note (Signed)
Encouraged smoking cessation, however she feels this is not the best time for her. Will continue to revisit.

## 2015-02-05 NOTE — Patient Instructions (Signed)
Labs today.  Follow up in 4 weeks. 

## 2015-02-05 NOTE — Assessment & Plan Note (Signed)
Fibromyalgia. Followed by rheumatology. Will continue prn Tramadol and Hydrocodone.

## 2015-02-05 NOTE — Assessment & Plan Note (Signed)
Reviewed notes from [facility]. Recent evaluation including EEG, CT head, Holter all unrevealing as to cause of syncope. Suspect that anxiety playing a role. Will continue prn alprazolam. Follow up with neurology as scheduled.

## 2015-02-05 NOTE — Assessment & Plan Note (Signed)
COPD symptoms have been relatively stable on Advair. Last PFTs over 5 years ago. Will set up evaluation with Pulmonary for repeat PFTs. Encouraged smoking cessation.

## 2015-02-06 LAB — COMPREHENSIVE METABOLIC PANEL
ALK PHOS: 62 U/L (ref 39–117)
ALT: 19 U/L (ref 0–35)
AST: 24 U/L (ref 0–37)
Albumin: 4 g/dL (ref 3.5–5.2)
BUN: 17 mg/dL (ref 6–23)
CALCIUM: 9.4 mg/dL (ref 8.4–10.5)
CO2: 31 meq/L (ref 19–32)
CREATININE: 0.81 mg/dL (ref 0.50–1.10)
Chloride: 102 mEq/L (ref 96–112)
Glucose, Bld: 78 mg/dL (ref 70–99)
POTASSIUM: 4.3 meq/L (ref 3.5–5.3)
Sodium: 139 mEq/L (ref 135–145)
Total Bilirubin: 0.4 mg/dL (ref 0.2–1.2)
Total Protein: 6.6 g/dL (ref 6.0–8.3)

## 2015-02-19 ENCOUNTER — Encounter: Payer: Self-pay | Admitting: Internal Medicine

## 2015-02-19 ENCOUNTER — Ambulatory Visit (INDEPENDENT_AMBULATORY_CARE_PROVIDER_SITE_OTHER): Payer: Self-pay | Admitting: Internal Medicine

## 2015-02-19 VITALS — BP 129/81 | HR 92 | Temp 98.5°F | Ht 64.0 in | Wt 171.5 lb

## 2015-02-19 DIAGNOSIS — G35 Multiple sclerosis: Secondary | ICD-10-CM

## 2015-02-19 NOTE — Progress Notes (Signed)
Pre visit review using our clinic review tool, if applicable. No additional management support is needed unless otherwise documented below in the visit note. 

## 2015-02-19 NOTE — Assessment & Plan Note (Signed)
Recurrent episodes of syncope/collapse and most recently extreme fatigue, with daytime somnolence and collapse at work. Denies taking any sedating medication prior to this incident. Encourage her to follow up with her neurologist. 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 She will discuss with her neurologist today and decide about what work restrictions would be appropriate.

## 2015-02-19 NOTE — Patient Instructions (Addendum)
Follow up with neurology as scheduled today. Call to confirm 2:30pm appt. [phone]  Follow up here in 4 weeks.

## 2015-02-19 NOTE — Progress Notes (Signed)
   Subjective:    Patient ID: Debra Hogan, female    DOB: 1961/04/06, 54 y.o.   MRN: [id number]  HPI  54YO female presents for acute visit.  Fell asleep when standing up at work yesterday. This has happened several times in the past.  Has been sleeping well at night. Feeling well today. Had not recently taken Alprazolam. Recently started on Oxycodone by pain management, but had not taken in a few days.   Past medical, surgical, family and social history per today's encounter.  Review of Systems  Constitutional: Negative for fever, chills, appetite change, fatigue and unexpected weight change.  Eyes: Negative for visual disturbance.  Respiratory: Negative for shortness of breath.   Cardiovascular: Negative for chest pain and leg swelling.  Gastrointestinal: Negative for abdominal pain.  Musculoskeletal: Positive for myalgias and arthralgias.  Skin: Negative for color change and rash.  Neurological: Positive for syncope, weakness and light-headedness.  Hematological: Negative for adenopathy. Does not bruise/bleed easily.  Psychiatric/Behavioral: Negative for sleep disturbance and dysphoric mood. The patient is not nervous/anxious.        Objective:    BP 129/81 mmHg  Pulse 92  Temp(Src) 98.5 F (36.9 C) (Oral)  Ht  (1.626 m)  Wt 171 lb 8 oz (77.792 kg)  BMI 29.42 kg/m2  SpO2 95% Physical Exam  Constitutional: She is oriented to person, place, and time. She appears well-developed and well-nourished. No distress.  HENT:  Head: Normocephalic and atraumatic.  Right Ear: External ear normal.  Left Ear: External ear normal.  Nose: Nose normal.  Mouth/Throat: Oropharynx is clear and moist.  Eyes: Conjunctivae are normal. Pupils are equal, round, and reactive to light. Right eye exhibits no discharge. Left eye exhibits no discharge. No scleral icterus.  Neck: Normal range of motion. Neck supple. No tracheal deviation present. No thyromegaly present.  Cardiovascular:  Normal rate, regular rhythm, normal heart sounds and intact distal pulses.  Exam reveals no gallop and no friction rub.   No murmur heard. Pulmonary/Chest: Effort normal and breath sounds normal. No respiratory distress. She has no wheezes. She has no rales. She exhibits no tenderness.  Musculoskeletal: Normal range of motion. She exhibits no edema or tenderness.  Lymphadenopathy:    She has no cervical adenopathy.  Neurological: She is alert and oriented to person, place, and time. No cranial nerve deficit. She exhibits normal muscle tone. Coordination normal.  Skin: Skin is warm and dry. No rash noted. She is not diaphoretic. No erythema. No pallor.  Psychiatric: She has a normal mood and affect. Her behavior is normal. Judgment and thought content normal.          Assessment & Plan:   Problem List Items Addressed This Visit      Unprioritized   DS (disseminated sclerosis) - Primary    Recurrent episodes of syncope/collapse and most recently extreme fatigue, with daytime somnolence and collapse at work. Denies taking any sedating medication prior to this incident. Encourage her to follow up with her neurologist. 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 She will discuss with her neurologist today and decide about what work restrictions would be appropriate.          Return in about 4 weeks (around 03/19/2015) for Recheck.

## 2015-02-23 ENCOUNTER — Institutional Professional Consult (permissible substitution): Payer: Self-pay | Admitting: Internal Medicine

## 2015-03-02 ENCOUNTER — Institutional Professional Consult (permissible substitution): Payer: Self-pay | Admitting: Internal Medicine

## 2015-03-18 ENCOUNTER — Ambulatory Visit: Payer: Self-pay | Admitting: Neurology

## 2015-03-26 ENCOUNTER — Other Ambulatory Visit: Payer: Self-pay | Admitting: Internal Medicine

## 2015-03-26 NOTE — Telephone Encounter (Signed)
Last Ov 2.26.16, last refill 3.3.16.  Please advise refill

## 2015-04-23 ENCOUNTER — Ambulatory Visit: Admit: 2015-04-23 | Disposition: A | Discharge status: Home / Self Care | Payer: Self-pay | Admitting: Neurology

## 2015-07-23 ENCOUNTER — Ambulatory Visit (INDEPENDENT_AMBULATORY_CARE_PROVIDER_SITE_OTHER): Payer: BLUE CROSS/BLUE SHIELD | Admitting: Family Medicine

## 2015-07-23 ENCOUNTER — Encounter: Payer: Self-pay | Admitting: Family Medicine

## 2015-07-23 VITALS — BP 114/80 | HR 96 | Temp 98.7°F | Ht 64.0 in | Wt 161.5 lb

## 2015-07-23 DIAGNOSIS — J441 Chronic obstructive pulmonary disease with (acute) exacerbation: Secondary | ICD-10-CM | POA: Diagnosis not present

## 2015-07-23 MED ORDER — ALBUTEROL SULFATE HFA 108 (90 BASE) MCG/ACT IN AERS: 2.0000 | INHALATION_SPRAY | Freq: Four times a day (QID) | RESPIRATORY_TRACT | Status: DC | PRN

## 2015-07-23 MED ORDER — DOXYCYCLINE HYCLATE 100 MG PO TABS: 100.0000 mg | ORAL_TABLET | Freq: Two times a day (BID) | ORAL | Status: DC

## 2015-07-23 MED ORDER — PREDNISONE 50 MG PO TABS: ORAL_TABLET | ORAL | Status: DC

## 2015-07-23 NOTE — Patient Instructions (Signed)
It was nice to see you today.  Take the antibiotic as prescribed (twice daily x 1 week).   Take the prednisone daily x 5 days.  Use the advair daily as prescribed.  Use the Albuterol every 6 hours as needed.  Follow up if you worsen or fail to improve.  Take care  Dr. Adriana Simas

## 2015-07-23 NOTE — Assessment & Plan Note (Signed)
Patient with acute COPD exacerbation. Treating with doxycycline, albuterol, and prednisone burst. Advised patient to be compliant with Advair. Patient to follow-up if she worsens or fails to improve.

## 2015-07-23 NOTE — Progress Notes (Signed)
   Subjective:    Patient ID: Debra Hogan, female    DOB: 12/25/1961, 54 y.o.   MRN: [id number]  CC: Cough/congestion  HPI 55 year old female with MS, GAD, tobacco abuse, and COPD presents to the clinic today with complaints of cough/congestion.  Patient reports she has had productive cough and chest congestion for the past 2 days. She has also had upper respiratory symptoms as well.  She states the cough is mildly productive of tan/brown sputum.  No known exacerbating or relieving factors.  She denies any associated fever but reports chills. She has had a known sick contact (her son has been sick with similar symptoms). Additionally, she reports associated shortness of breath which is worse from her baseline.  She has not tried any medications or over-the-counter remedies. She states that she is not using her Advair on a regular basis. He does not have a rescue inhaler at home.  Social Hx - current everyday smoker. Smokes one pack per day.  Review of Systems Per HPI. All other systems negative.    Objective: Blood pressure 114/80, pulse 96, temperature 98.7 F (37.1 C), temperature source Oral, height  (1.626 m), weight 161 lb 8 oz (73.256 kg), SpO2 92 %. Body mass index is 27.71 kg/(m^2).    Physical Exam  Constitutional: She is oriented to person, place, and time.  NAD but patient appears slightly ill/fatigued.  HENT:  Head: Normocephalic and atraumatic.  Nose: Nose normal.  Mild pharyngeal erythema. No exudate.   Eyes: Conjunctivae are normal. No scleral icterus.  Cardiovascular: Regular rhythm.  Tachycardia present.   No murmur heard. Pulmonary/Chest: Effort normal.  Diffuse expiratory wheezing throughout.  Neurological: She is alert and oriented to person, place, and time.  Skin: Skin is warm and dry. No rash noted.  Vitals reviewed.     Assessment & Plan:  See Problem List

## 2015-09-20 ENCOUNTER — Other Ambulatory Visit: Payer: Self-pay | Admitting: Internal Medicine

## 2015-09-20 ENCOUNTER — Telehealth: Payer: Self-pay | Admitting: Internal Medicine

## 2015-09-20 NOTE — Telephone Encounter (Signed)
Pt called about not having any insurance. Pt ran out of refills. Pt wants to know if a prescription can be called in instead of coming in? Pharmacy is [facility] on [address].

## 2015-09-20 NOTE — Telephone Encounter (Signed)
Spoke with patient regarding clarity on what Prescription she was looking to have refilled.  Requesting Acyclovir  take one tablet by mouth daily.  Ordered 90 with no refills on 03/26/2015.  Please advise?  Patient has no insurance coverage at this time.

## 2015-09-20 NOTE — Telephone Encounter (Signed)
Fine to refill #90 with 1 refill.

## 2015-09-27 ENCOUNTER — Encounter: Payer: BLUE CROSS/BLUE SHIELD | Admitting: Pain Medicine

## 2015-10-21 ENCOUNTER — Other Ambulatory Visit: Payer: Self-pay | Admitting: Internal Medicine

## 2015-10-21 ENCOUNTER — Ambulatory Visit (INDEPENDENT_AMBULATORY_CARE_PROVIDER_SITE_OTHER): Payer: 59 | Admitting: Internal Medicine

## 2015-10-21 ENCOUNTER — Encounter: Payer: Self-pay | Admitting: Internal Medicine

## 2015-10-21 VITALS — BP 150/75 | HR 88 | Temp 97.9°F | Ht 64.0 in | Wt 153.5 lb

## 2015-10-21 DIAGNOSIS — R7989 Other specified abnormal findings of blood chemistry: Secondary | ICD-10-CM

## 2015-10-21 DIAGNOSIS — G35 Multiple sclerosis: Secondary | ICD-10-CM | POA: Diagnosis not present

## 2015-10-21 DIAGNOSIS — E039 Hypothyroidism, unspecified: Secondary | ICD-10-CM | POA: Diagnosis not present

## 2015-10-21 DIAGNOSIS — F411 Generalized anxiety disorder: Secondary | ICD-10-CM

## 2015-10-21 DIAGNOSIS — Z23 Encounter for immunization: Secondary | ICD-10-CM | POA: Diagnosis not present

## 2015-10-21 DIAGNOSIS — K219 Gastro-esophageal reflux disease without esophagitis: Secondary | ICD-10-CM | POA: Diagnosis not present

## 2015-10-21 DIAGNOSIS — R945 Abnormal results of liver function studies: Principal | ICD-10-CM

## 2015-10-21 LAB — CBC WITH DIFFERENTIAL/PLATELET
BASOS PCT: 0.7 % (ref 0.0–3.0)
Basophils Absolute: 0 10*3/uL (ref 0.0–0.1)
EOS ABS: 0.1 10*3/uL (ref 0.0–0.7)
Eosinophils Relative: 1.9 % (ref 0.0–5.0)
HEMATOCRIT: 44.9 % (ref 36.0–46.0)
HEMOGLOBIN: 15.3 g/dL — AB (ref 12.0–15.0)
LYMPHS PCT: 13.4 % (ref 12.0–46.0)
Lymphs Abs: 0.6 10*3/uL — ABNORMAL LOW (ref 0.7–4.0)
MCHC: 34 g/dL (ref 30.0–36.0)
MCV: 90.6 fl (ref 78.0–100.0)
MONO ABS: 0.4 10*3/uL (ref 0.1–1.0)
Monocytes Relative: 8.9 % (ref 3.0–12.0)
Neutro Abs: 3.6 10*3/uL (ref 1.4–7.7)
Neutrophils Relative %: 75.1 % (ref 43.0–77.0)
Platelets: 198 10*3/uL (ref 150.0–400.0)
RBC: 4.96 Mil/uL (ref 3.87–5.11)
RDW: 13.6 % (ref 11.5–15.5)
WBC: 4.8 10*3/uL (ref 4.0–10.5)

## 2015-10-21 LAB — COMPREHENSIVE METABOLIC PANEL
ALBUMIN: 4.1 g/dL (ref 3.5–5.2)
ALK PHOS: 152 U/L — AB (ref 39–117)
ALT: 128 U/L — ABNORMAL HIGH (ref 0–35)
AST: 107 U/L — AB (ref 0–37)
BUN: 16 mg/dL (ref 6–23)
CALCIUM: 9.8 mg/dL (ref 8.4–10.5)
CHLORIDE: 103 meq/L (ref 96–112)
CO2: 30 mEq/L (ref 19–32)
Creatinine, Ser: 0.75 mg/dL (ref 0.40–1.20)
GFR: 84.92 mL/min (ref >60.00)
Glucose, Bld: 89 mg/dL (ref 70–99)
Potassium: 3.6 mEq/L (ref 3.5–5.1)
SODIUM: 141 meq/L (ref 135–145)
TOTAL PROTEIN: 6.7 g/dL (ref 6.0–8.3)
Total Bilirubin: 0.4 mg/dL (ref 0.2–1.2)

## 2015-10-21 LAB — TSH: TSH: 6.72 u[IU]/mL — ABNORMAL HIGH (ref 0.35–4.50)

## 2015-10-21 MED ORDER — ALPRAZOLAM 0.25 MG PO TABS: 0.2500 mg | ORAL_TABLET | Freq: Three times a day (TID) | ORAL | Status: DC | PRN

## 2015-10-21 MED ORDER — PANTOPRAZOLE SODIUM 40 MG PO TBEC: 40.0000 mg | DELAYED_RELEASE_TABLET | Freq: Every day | ORAL | Status: DC

## 2015-10-21 NOTE — Assessment & Plan Note (Signed)
Symptoms of abdominal pain most c/w GERD. Will start Pantoprazole 40mg  daily. Recheck 2 weeks or sooner if symptoms not improving. Discussed potential referral to GI if no improvement, for EGD.

## 2015-10-21 NOTE — Progress Notes (Signed)
Pre visit review using our clinic review tool, if applicable. No additional management support is needed unless otherwise documented below in the visit note. 

## 2015-10-21 NOTE — Assessment & Plan Note (Signed)
Encouraged follow up with neurology.

## 2015-10-21 NOTE — Progress Notes (Signed)
Subjective:    Patient ID: Debra Hogan, female    DOB: 1961/09/27, 54 y.o.   MRN: [id number]  HPI  54YO female presents for acute visit.  GERD - Having some epigastric pain that radiates to back. Eases off with taking GasX. Occasional nausea and vomiting. No blood in vomit. Started with issues when son living with her. No blood in stool or black stool.  GAD - Recently had to have son evicted from her home because of drug use. Had restraining order placed. Now living with her husband. Previously used Alprazolam to help with this, but has been out.  MS - Has not recently followed up with neurology. Off Lyrica.   Wt Readings from Last 3 Encounters:  10/21/15 153 lb 8 oz (69.627 kg)  07/23/15 161 lb 8 oz (73.256 kg)  02/19/15 171 lb 8 oz (77.792 kg)   BP Readings from Last 3 Encounters:  10/21/15 150/75  07/23/15 114/80  02/19/15 129/81    Past Medical History  Diagnosis Date  . Fibromyalgia   . Irritable bowel syndrome   . Depression   . Arthritis   . COPD (chronic obstructive pulmonary disease) (HCC)   . MS (multiple sclerosis) (HCC)     Followed by Dr. Sherryll Burger   Family History  Problem Relation Age of Onset  . Hypertension Mother   . Hyperlipidemia Mother   . Arthritis Mother   . Cancer Father     throat  . Heart disease Brother    Past Surgical History  Procedure Laterality Date  . Laparoscopic endometriosis fulguration    . Foot surgery  2001  . Abdominal hysterectomy     Social History   Social History  . Marital Status: Married    Spouse Name: N/A  . Number of Children: N/A  . Years of Education: N/A   Social History Main Topics  . Smoking status: Current Every Day Smoker    Last Attempt to Quit: 10/21/2008  . Smokeless tobacco: None  . Alcohol Use: None  . Drug Use: No  . Sexual Activity: Not Asked   Other Topics Concern  . None   Social History Narrative   Lives in [place] with son. Widowed x 2.      Work - Designer, industrial/product          Review  of Systems  Constitutional: Negative for fever, chills, appetite change, fatigue and unexpected weight change.  Eyes: Negative for visual disturbance.  Respiratory: Negative for shortness of breath.   Cardiovascular: Negative for chest pain and leg swelling.  Gastrointestinal: Positive for nausea and abdominal pain. Negative for diarrhea, constipation, blood in stool and abdominal distention.  Musculoskeletal: Positive for myalgias and arthralgias.  Skin: Negative for color change and rash.  Hematological: Negative for adenopathy. Does not bruise/bleed easily.  Psychiatric/Behavioral: Negative for sleep disturbance and dysphoric mood. The patient is nervous/anxious.        Objective:    BP 150/75 mmHg  Pulse 88  Temp(Src) 97.9 F (36.6 C) (Oral)  Ht 5\' 4"  (1.626 m)  Wt 153 lb 8 oz (69.627 kg)  BMI 26.34 kg/m2  SpO2 96% Physical Exam  Constitutional: She is oriented to person, place, and time. She appears well-developed and well-nourished. No distress.  HENT:  Head: Normocephalic and atraumatic.  Right Ear: External ear normal.  Left Ear: External ear normal.  Nose: Nose normal.  Mouth/Throat: Oropharynx is clear and moist. No oropharyngeal exudate.  Eyes: Conjunctivae and EOM are normal. Pupils  are equal, round, and reactive to light. Right eye exhibits no discharge. Left eye exhibits no discharge. No scleral icterus.  Neck: Normal range of motion. Neck supple. No tracheal deviation present. No thyromegaly present.  Cardiovascular: Normal rate, regular rhythm, normal heart sounds and intact distal pulses.  Exam reveals no gallop and no friction rub.   No murmur heard. Pulmonary/Chest: Effort normal and breath sounds normal. No respiratory distress. She has no wheezes. She has no rales. She exhibits no tenderness.  Abdominal: Soft. Bowel sounds are normal. She exhibits no distension and no mass. There is tenderness (very mild epigastric) in the epigastric area. There is no rebound  and no guarding.  Musculoskeletal: Normal range of motion. She exhibits no edema or tenderness.  Lymphadenopathy:    She has no cervical adenopathy.  Neurological: She is alert and oriented to person, place, and time. No cranial nerve deficit. She exhibits normal muscle tone. Coordination normal.  Skin: Skin is warm and dry. No rash noted. She is not diaphoretic. No erythema. No pallor.  Psychiatric: Her speech is normal and behavior is normal. Judgment and thought content normal. Her mood appears anxious. Cognition and memory are normal.          Assessment & Plan:   Problem List Items Addressed This Visit      Unprioritized   DS (disseminated sclerosis) (HCC)    Encouraged follow up with neurology.      Relevant Medications   Dimethyl Fumarate 240 MG CPDR   Esophageal reflux    Symptoms of abdominal pain most c/w GERD. Will start Pantoprazole  daily. Recheck 2 weeks or sooner if symptoms not improving. Discussed potential referral to GI if no improvement, for EGD.      Relevant Medications   pantoprazole (PROTONIX) 40 MG tablet   Other Relevant Orders   Comprehensive metabolic panel   CBC with Differential/Platelet   Generalized anxiety disorder - Primary    Symptoms worsened by recent events with her son. Offered support today. Discussed adding daily medication, however she prefers not to. Will continue prn Alprazolam.      Relevant Medications   ALPRAZolam (XANAX) 0.25 MG tablet   Hypothyroidism    Will recheck TSH with labs today.      Relevant Orders   TSH       Return in about 2 weeks (around 11/04/2015) for Recheck.

## 2015-10-21 NOTE — Assessment & Plan Note (Signed)
Symptoms worsened by recent events with her son. Offered support today. Discussed adding daily medication, however she prefers not to. Will continue prn Alprazolam.

## 2015-10-21 NOTE — Patient Instructions (Signed)
Start Pantoprazole 40mg  daily for the next two weeks.  Follow up in 2 weeks.

## 2015-10-21 NOTE — Assessment & Plan Note (Signed)
Will recheck TSH with labs today. 

## 2015-10-22 ENCOUNTER — Other Ambulatory Visit (INDEPENDENT_AMBULATORY_CARE_PROVIDER_SITE_OTHER): Payer: 59

## 2015-10-22 DIAGNOSIS — R7989 Other specified abnormal findings of blood chemistry: Secondary | ICD-10-CM

## 2015-10-22 DIAGNOSIS — R945 Abnormal results of liver function studies: Principal | ICD-10-CM

## 2015-10-22 LAB — AMMONIA: Ammonia: 105 umol/L — ABNORMAL HIGH (ref 16–53)

## 2015-10-22 LAB — PROTIME-INR
INR: 1 ratio (ref 0.8–1.0)
PROTHROMBIN TIME: 10.6 s (ref 9.6–13.1)

## 2015-10-22 LAB — LIPASE: Lipase: 53 U/L (ref 11.0–59.0)

## 2015-10-22 LAB — T4, FREE: FREE T4: 0.8 ng/dL (ref 0.60–1.60)

## 2015-10-23 LAB — HEPATITIS C ANTIBODY: HCV AB: NEGATIVE

## 2015-10-23 LAB — HEPATITIS B SURFACE ANTIBODY,QUALITATIVE: HEP B S AB: NEGATIVE

## 2015-10-23 LAB — HEPATITIS A ANTIBODY, IGM: Hep A IgM: NONREACTIVE

## 2015-10-23 LAB — IGG, IGA, IGM
IGA: 178 mg/dL (ref 69–380)
IGG (IMMUNOGLOBIN G), SERUM: 713 mg/dL (ref 690–1700)
IgM, Serum: 111 mg/dL (ref 52–322)

## 2015-10-23 LAB — HEPATITIS B CORE ANTIBODY, TOTAL: Hep B Core Total Ab: NONREACTIVE

## 2015-10-23 LAB — HEPATITIS B SURFACE ANTIGEN: HEP B S AG: NEGATIVE

## 2015-10-23 LAB — HIV ANTIBODY (ROUTINE TESTING W REFLEX): HIV: NONREACTIVE

## 2015-10-25 LAB — HSV(HERPES SIMPLEX VRS) I + II AB-IGG
HSV 1 Glycoprotein G Ab, IgG: 1.24 IV — ABNORMAL HIGH
HSV 2 Glycoprotein G Ab, IgG: 2.24 IV — ABNORMAL HIGH

## 2015-10-25 LAB — ANA: Anti Nuclear Antibody(ANA): NEGATIVE

## 2015-10-26 LAB — CMV IGM: CMV IGM: 8.91 [AU]/ml (ref <30.00)

## 2015-10-26 LAB — CERULOPLASMIN: CERULOPLASMIN: 24 mg/dL (ref 18–53)

## 2015-10-26 LAB — ANTI-SMOOTH MUSCLE ANTIBODY, IGG: SMOOTH MUSCLE AB: 6 U (ref <20)

## 2015-10-27 LAB — ANTI-MICROSOMAL ANTIBODY LIVER / KIDNEY: LKM1 Ab: <=20 U (ref <=20.0)

## 2015-10-28 ENCOUNTER — Other Ambulatory Visit (INDEPENDENT_AMBULATORY_CARE_PROVIDER_SITE_OTHER): Payer: 59

## 2015-10-28 DIAGNOSIS — Z0000 Encounter for general adult medical examination without abnormal findings: Secondary | ICD-10-CM

## 2015-10-28 DIAGNOSIS — R7989 Other specified abnormal findings of blood chemistry: Secondary | ICD-10-CM

## 2015-10-28 LAB — COMPREHENSIVE METABOLIC PANEL
ALBUMIN: 3.9 g/dL (ref 3.5–5.2)
ALT: 24 U/L (ref 0–35)
AST: 23 U/L (ref 0–37)
Alkaline Phosphatase: 94 U/L (ref 39–117)
BUN: 12 mg/dL (ref 6–23)
CO2: 31 mEq/L (ref 19–32)
CREATININE: 0.76 mg/dL (ref 0.40–1.20)
Calcium: 9.8 mg/dL (ref 8.4–10.5)
Chloride: 102 mEq/L (ref 96–112)
GFR: 83.63 mL/min (ref >60.00)
Glucose, Bld: 91 mg/dL (ref 70–99)
POTASSIUM: 3.8 meq/L (ref 3.5–5.1)
SODIUM: 140 meq/L (ref 135–145)
TOTAL PROTEIN: 6.7 g/dL (ref 6.0–8.3)
Total Bilirubin: 0.5 mg/dL (ref 0.2–1.2)

## 2015-10-28 LAB — CBC WITH DIFFERENTIAL/PLATELET
BASOS ABS: 0 10*3/uL (ref 0.0–0.1)
Basophils Relative: 0.5 % (ref 0.0–3.0)
Eosinophils Absolute: 0.1 10*3/uL (ref 0.0–0.7)
Eosinophils Relative: 1.5 % (ref 0.0–5.0)
HCT: 44.7 % (ref 36.0–46.0)
HEMOGLOBIN: 15.4 g/dL — AB (ref 12.0–15.0)
LYMPHS PCT: 10.4 % — AB (ref 12.0–46.0)
Lymphs Abs: 0.6 10*3/uL — ABNORMAL LOW (ref 0.7–4.0)
MCHC: 34.4 g/dL (ref 30.0–36.0)
MCV: 89.8 fl (ref 78.0–100.0)
MONOS PCT: 7.8 % (ref 3.0–12.0)
Monocytes Absolute: 0.4 10*3/uL (ref 0.1–1.0)
NEUTROS PCT: 79.8 % — AB (ref 43.0–77.0)
Neutro Abs: 4.5 10*3/uL (ref 1.4–7.7)
Platelets: 188 10*3/uL (ref 150.0–400.0)
RBC: 4.97 Mil/uL (ref 3.87–5.11)
RDW: 12.4 % (ref 11.5–15.5)
WBC: 5.6 10*3/uL (ref 4.0–10.5)

## 2015-10-28 LAB — LIPID PANEL
CHOL/HDL RATIO: 6
CHOLESTEROL: 240 mg/dL — AB (ref 0–200)
HDL: 41.3 mg/dL (ref >39.00)
Triglycerides: 492 mg/dL — ABNORMAL HIGH (ref 0.0–149.0)

## 2015-10-28 LAB — LDL CHOLESTEROL, DIRECT: Direct LDL: 127 mg/dL

## 2015-10-28 LAB — TSH: TSH: 4.11 u[IU]/mL (ref 0.35–4.50)

## 2015-11-12 ENCOUNTER — Ambulatory Visit: Payer: 59 | Admitting: Internal Medicine

## 2015-12-31 ENCOUNTER — Telehealth: Payer: Self-pay | Admitting: Internal Medicine

## 2015-12-31 ENCOUNTER — Inpatient Hospital Stay
Admission: EM | Admit: 2015-12-31 | Discharge: 2016-01-05 | DRG: 419 | Disposition: A | Discharge status: Home / Self Care | Payer: 59 | Attending: Surgery | Admitting: Surgery

## 2015-12-31 ENCOUNTER — Emergency Department: Payer: 59

## 2015-12-31 ENCOUNTER — Encounter: Payer: Self-pay | Admitting: Emergency Medicine

## 2015-12-31 DIAGNOSIS — M797 Fibromyalgia: Secondary | ICD-10-CM | POA: Diagnosis present

## 2015-12-31 DIAGNOSIS — Z90710 Acquired absence of both cervix and uterus: Secondary | ICD-10-CM

## 2015-12-31 DIAGNOSIS — Z809 Family history of malignant neoplasm, unspecified: Secondary | ICD-10-CM

## 2015-12-31 DIAGNOSIS — R109 Unspecified abdominal pain: Secondary | ICD-10-CM

## 2015-12-31 DIAGNOSIS — K8043 Calculus of bile duct with acute cholecystitis with obstruction: Secondary | ICD-10-CM | POA: Diagnosis not present

## 2015-12-31 DIAGNOSIS — Z8249 Family history of ischemic heart disease and other diseases of the circulatory system: Secondary | ICD-10-CM

## 2015-12-31 DIAGNOSIS — F329 Major depressive disorder, single episode, unspecified: Secondary | ICD-10-CM | POA: Diagnosis present

## 2015-12-31 DIAGNOSIS — R1011 Right upper quadrant pain: Secondary | ICD-10-CM | POA: Diagnosis not present

## 2015-12-31 DIAGNOSIS — F17210 Nicotine dependence, cigarettes, uncomplicated: Secondary | ICD-10-CM | POA: Diagnosis present

## 2015-12-31 DIAGNOSIS — K8050 Calculus of bile duct without cholangitis or cholecystitis without obstruction: Secondary | ICD-10-CM | POA: Insufficient documentation

## 2015-12-31 DIAGNOSIS — K8063 Calculus of gallbladder and bile duct with acute cholecystitis with obstruction: Principal | ICD-10-CM | POA: Diagnosis present

## 2015-12-31 DIAGNOSIS — G35 Multiple sclerosis: Secondary | ICD-10-CM | POA: Diagnosis present

## 2015-12-31 DIAGNOSIS — J449 Chronic obstructive pulmonary disease, unspecified: Secondary | ICD-10-CM | POA: Diagnosis present

## 2015-12-31 DIAGNOSIS — Z6828 Body mass index (BMI) 28.0-28.9, adult: Secondary | ICD-10-CM

## 2015-12-31 DIAGNOSIS — Z419 Encounter for procedure for purposes other than remedying health state, unspecified: Secondary | ICD-10-CM

## 2015-12-31 DIAGNOSIS — G8929 Other chronic pain: Secondary | ICD-10-CM | POA: Diagnosis present

## 2015-12-31 LAB — CBC WITH DIFFERENTIAL/PLATELET
Basophils Absolute: 0 10*3/uL (ref 0–0.1)
Basophils Relative: 1 %
EOS PCT: 1 %
Eosinophils Absolute: 0.1 10*3/uL (ref 0–0.7)
HEMATOCRIT: 44.8 % (ref 35.0–47.0)
Hemoglobin: 15.1 g/dL (ref 12.0–16.0)
LYMPHS ABS: 0.6 10*3/uL — AB (ref 1.0–3.6)
LYMPHS PCT: 9 %
MCH: 30.8 pg (ref 26.0–34.0)
MCHC: 33.8 g/dL (ref 32.0–36.0)
MCV: 91.3 fL (ref 80.0–100.0)
MONO ABS: 0.5 10*3/uL (ref 0.2–0.9)
Monocytes Relative: 8 %
Neutro Abs: 5.7 10*3/uL (ref 1.4–6.5)
Neutrophils Relative %: 81 %
PLATELETS: 209 10*3/uL (ref 150–440)
RBC: 4.91 MIL/uL (ref 3.80–5.20)
RDW: 13.2 % (ref 11.5–14.5)
WBC: 6.9 10*3/uL (ref 3.6–11.0)

## 2015-12-31 LAB — COMPREHENSIVE METABOLIC PANEL
ALT: 169 U/L — AB (ref 14–54)
AST: 143 U/L — ABNORMAL HIGH (ref 15–41)
Albumin: 5 g/dL (ref 3.5–5.0)
Alkaline Phosphatase: 177 U/L — ABNORMAL HIGH (ref 38–126)
Anion gap: 10 (ref 5–15)
BILIRUBIN TOTAL: 1.4 mg/dL — AB (ref 0.3–1.2)
BUN: 15 mg/dL (ref 6–20)
CHLORIDE: 103 mmol/L (ref 101–111)
CO2: 32 mmol/L (ref 22–32)
CREATININE: 0.65 mg/dL (ref 0.44–1.00)
Calcium: 10.1 mg/dL (ref 8.9–10.3)
Glucose, Bld: 134 mg/dL — ABNORMAL HIGH (ref 65–99)
POTASSIUM: 3.2 mmol/L — AB (ref 3.5–5.1)
Sodium: 145 mmol/L (ref 135–145)
TOTAL PROTEIN: 7.9 g/dL (ref 6.5–8.1)

## 2015-12-31 LAB — URINALYSIS COMPLETE WITH MICROSCOPIC (ARMC ONLY)
Bacteria, UA: NONE SEEN
Bilirubin Urine: NEGATIVE
Glucose, UA: NEGATIVE mg/dL
HGB URINE DIPSTICK: NEGATIVE
Leukocytes, UA: NEGATIVE
Nitrite: NEGATIVE
PH: 6 (ref 5.0–8.0)
Protein, ur: 100 mg/dL — AB
SPECIFIC GRAVITY, URINE: 1.025 (ref 1.005–1.030)

## 2015-12-31 LAB — LIPASE, BLOOD: LIPASE: 32 U/L (ref 11–51)

## 2015-12-31 MED ORDER — SODIUM CHLORIDE 0.9 % IV BOLUS (SEPSIS)
1000.0000 mL | Freq: Once | INTRAVENOUS | Status: AC
  Administered 2015-12-31: 1000 mL via INTRAVENOUS

## 2015-12-31 MED ORDER — NICOTINE 21 MG/24HR TD PT24
21.0000 mg | MEDICATED_PATCH | Freq: Every day | TRANSDERMAL | Status: DC
  Administered 2015-12-31 – 2016-01-04 (×5): 21 mg via TRANSDERMAL
  Filled 2015-12-31 (×5): qty 1

## 2015-12-31 MED ORDER — ONDANSETRON HCL 4 MG/2ML IJ SOLN
INTRAMUSCULAR | Status: AC
  Filled 2015-12-31: qty 2

## 2015-12-31 MED ORDER — ONDANSETRON HCL 4 MG/2ML IJ SOLN
4.0000 mg | Freq: Once | INTRAMUSCULAR | Status: AC
  Administered 2015-12-31: 4 mg via INTRAVENOUS

## 2015-12-31 MED ORDER — MORPHINE SULFATE (PF) 2 MG/ML IV SOLN
2.0000 mg | INTRAVENOUS | Status: DC | PRN
  Administered 2016-01-01 – 2016-01-04 (×11): 2 mg via INTRAVENOUS
  Filled 2015-12-31 (×11): qty 1

## 2015-12-31 MED ORDER — ALPRAZOLAM 0.25 MG PO TABS: 0.2500 mg | ORAL_TABLET | Freq: Three times a day (TID) | ORAL | Status: DC | PRN

## 2015-12-31 MED ORDER — PANTOPRAZOLE SODIUM 40 MG PO TBEC
40.0000 mg | DELAYED_RELEASE_TABLET | Freq: Every day | ORAL | Status: DC
  Filled 2015-12-31: qty 1

## 2015-12-31 MED ORDER — MORPHINE SULFATE (PF) 4 MG/ML IV SOLN
4.0000 mg | Freq: Once | INTRAVENOUS | Status: AC
  Administered 2015-12-31: 4 mg via INTRAVENOUS
  Filled 2015-12-31: qty 1

## 2015-12-31 MED ORDER — HEPARIN SODIUM (PORCINE) 5000 UNIT/ML IJ SOLN
5000.0000 [IU] | Freq: Three times a day (TID) | INTRAMUSCULAR | Status: DC
  Administered 2015-12-31 – 2016-01-03 (×8): 5000 [IU] via SUBCUTANEOUS
  Filled 2015-12-31 (×8): qty 1

## 2015-12-31 MED ORDER — OXYCODONE HCL 5 MG PO TABS
5.0000 mg | ORAL_TABLET | ORAL | Status: DC | PRN
  Administered 2016-01-01 – 2016-01-04 (×5): 5 mg via ORAL
  Filled 2015-12-31 (×5): qty 1

## 2015-12-31 MED ORDER — LACTATED RINGERS IV SOLN
INTRAVENOUS | Status: DC
  Administered 2015-12-31 – 2016-01-02 (×5): via INTRAVENOUS

## 2015-12-31 MED ORDER — HYDROCODONE-ACETAMINOPHEN 5-325 MG PO TABS
1.0000 | ORAL_TABLET | ORAL | Status: AC
  Administered 2015-12-31: 1 via ORAL
  Filled 2015-12-31: qty 1

## 2015-12-31 MED ORDER — ONDANSETRON HCL 4 MG PO TABS
4.0000 mg | ORAL_TABLET | Freq: Four times a day (QID) | ORAL | Status: DC | PRN
  Administered 2016-01-01: 4 mg via ORAL
  Filled 2015-12-31: qty 1

## 2015-12-31 MED ORDER — MOMETASONE FURO-FORMOTEROL FUM 100-5 MCG/ACT IN AERO
2.0000 | INHALATION_SPRAY | Freq: Two times a day (BID) | RESPIRATORY_TRACT | Status: DC
Start: 2015-12-31 — End: 2016-01-05
  Administered 2015-12-31 – 2016-01-02 (×3): 2 via RESPIRATORY_TRACT
  Filled 2015-12-31: qty 8.8

## 2015-12-31 MED ORDER — ALBUTEROL SULFATE (2.5 MG/3ML) 0.083% IN NEBU: 2.5000 mg | INHALATION_SOLUTION | Freq: Four times a day (QID) | RESPIRATORY_TRACT | Status: DC | PRN

## 2015-12-31 MED ORDER — ONDANSETRON HCL 4 MG/2ML IJ SOLN
4.0000 mg | Freq: Four times a day (QID) | INTRAMUSCULAR | Status: DC | PRN
  Administered 2016-01-03 – 2016-01-04 (×3): 4 mg via INTRAVENOUS
  Filled 2015-12-31 (×2): qty 2

## 2015-12-31 MED ORDER — AMPICILLIN-SULBACTAM SODIUM 3 (2-1) G IJ SOLR
3.0000 g | Freq: Four times a day (QID) | INTRAMUSCULAR | Status: DC
  Administered 2015-12-31 – 2016-01-05 (×18): 3 g via INTRAVENOUS
  Filled 2015-12-31 (×23): qty 3

## 2015-12-31 MED ORDER — MORPHINE SULFATE (PF) 2 MG/ML IV SOLN
2.0000 mg | Freq: Once | INTRAVENOUS | Status: AC
  Administered 2015-12-31: 2 mg via INTRAVENOUS
  Filled 2015-12-31: qty 1

## 2015-12-31 MED ORDER — ACYCLOVIR 400 MG PO TABS
400.0000 mg | ORAL_TABLET | Freq: Every day | ORAL | Status: DC
  Filled 2015-12-31 (×2): qty 1

## 2015-12-31 NOTE — Telephone Encounter (Signed)
Pt called in pain wanting to know if she can take 3 and will that work. And she is pain and she already took 1. Medication is pantoprazole (PROTONIX) 40 MG tablet. Pharmacy is [facility] in [place]. New pharmacy. Call pt @ [phone]. Thank You!

## 2015-12-31 NOTE — Telephone Encounter (Signed)
Spoke with pt.  Pt states that she is having upper abdominal pain that radiates to her back, feels like it is acid reflux.   Pt sounded as though she was in severe pain by the tone of her voice.  Advised pt to be seen, No appointments available in the office.  Advised pt to either be seen at [facility] or ED.

## 2015-12-31 NOTE — ED Provider Notes (Signed)
[facility] Emergency Department Provider Note REMINDER - THIS NOTE IS NOT A FINAL MEDICAL RECORD UNTIL IT IS SIGNED. UNTIL THEN, THE CONTENT BELOW [place] REFLECT INFORMATION FROM A DOCUMENTATION TEMPLATE, NOT THE ACTUAL PATIENT VISIT. ____________________________________________  Time seen: Approximately 8:09 PM  I have reviewed the triage vital signs and the nursing notes.   HISTORY  Chief Complaint Abdominal Pain    HPI Debra Hogan is a 55 y.o. female transient abdominal pain. Intermittent abdominal pain for the last 2 weeks. Tonight severe episode lasting a few hours in the upper abdomen that radiates to the back. Associated with nausea and vomiting. No fevers or chills. No chest pain or trouble breathing.  10 out of 10, severe sharp pain. Improved after morphine but still some present. Currently moderate.  Past Medical History  Diagnosis Date  . Fibromyalgia   . Irritable bowel syndrome   . Depression   . Arthritis   . COPD (chronic obstructive pulmonary disease) (HCC)   . MS (multiple sclerosis) (HCC)     Followed by Dr. Sherryll Burger    Patient Active Problem List   Diagnosis Date Noted  . Choledocholithiasis with acute cholecystitis with obstruction 12/31/2015  . Abdominal pain   . Multiple sclerosis (HCC)   . Esophageal reflux 10/21/2015  . COPD, moderate (HCC) 02/05/2015  . Generalized anxiety disorder 12/03/2014  . Syncope and collapse 12/03/2014  . DS (disseminated sclerosis) (HCC) 05/28/2014  . Routine general medical examination at a health care facility 09/19/2013  . Screening for breast cancer 09/19/2013  . Tobacco abuse 05/22/2013  . Fibromyalgia 10/21/2012  . Genital herpes 10/21/2012  . Hypothyroidism 10/21/2012    Past Surgical History  Procedure Laterality Date  . Laparoscopic endometriosis fulguration    . Foot surgery  2001  . Abdominal hysterectomy      Current Outpatient Rx  Name  Route  Sig  Dispense  Refill  . acyclovir  (ZOVIRAX) 400 MG tablet      TAKE ONE TABLET BY MOUTH ONCE DAILY   90 tablet   0   . albuterol (PROVENTIL HFA;VENTOLIN HFA) 108 (90 BASE) MCG/ACT inhaler   Inhalation   Inhale 2 puffs into the lungs every 6 (six) hours as needed for wheezing or shortness of breath.   1 Inhaler   0   . ALPRAZolam (XANAX) 0.25 MG tablet   Oral   Take 1 tablet (0.25 mg total) by mouth 3 (three) times daily as needed for sleep or anxiety.   90 tablet   1   . diclofenac sodium (VOLTAREN) 1 % GEL               . Dimethyl Fumarate 240 MG CPDR   Oral   Take by mouth.         . Fluticasone-Salmeterol (ADVAIR) 100-50 MCG/DOSE AEPB   Inhalation   Inhale 1 puff into the lungs as needed.   60 each   3   . oxyCODONE (OXY IR/ROXICODONE) 5 MG immediate release tablet               . pantoprazole (PROTONIX) 40 MG tablet   Oral   Take 1 tablet (40 mg total) by mouth daily.   30 tablet   3   . traMADol (ULTRAM) 50 MG tablet   Oral   Take 50 mg by mouth every 6 (six) hours as needed.            Allergies Valium  Family History  Problem Relation Age of Onset  . Hypertension Mother   . Hyperlipidemia Mother   . Arthritis Mother   . Cancer Father     throat  . Heart disease Brother     Social History Social History  Substance Use Topics  . Smoking status: Current Every Day Smoker    Last Attempt to Quit: 10/21/2008  . Smokeless tobacco: None  . Alcohol Use: None    Review of Systems Constitutional: No fever/chills Eyes: No visual changes. ENT: No sore throat. Cardiovascular: Denies chest pain. Respiratory: Denies shortness of breath. Gastrointestinal: See history of present illness  No diarrhea.  No constipation. Genitourinary: Negative for dysuria. Musculoskeletal: Negative for back pain. Skin: Negative for rash. Neurological: Negative for headaches, focal weakness or numbness.  10-point ROS otherwise  negative.  ____________________________________________   PHYSICAL EXAM:  VITAL SIGNS: ED Triage Vitals  Enc Vitals Group     BP 12/31/15 1609 158/90 mmHg     Pulse Rate 12/31/15 1609 84     Resp 12/31/15 1609 22     Temp 12/31/15 1609 97.6 F (36.4 C)     Temp Source 12/31/15 1609 Oral     SpO2 12/31/15 1609 97 %     Weight 12/31/15 1609 170 lb (77.111 kg)     Height 12/31/15 1609 5\' 5"  (1.651 m)     Head Cir --      Peak Flow --      Pain Score 12/31/15 1608 10     Pain Loc --      Pain Edu? --      Excl. in GC? --    Constitutional: Alert and oriented. Well appearing and in no acute distress. Eyes: Conjunctivae are normal. PERRL. EOMI. Head: Atraumatic. Nose: No congestion/rhinnorhea. Mouth/Throat: Mucous membranes are moist.  Oropharynx non-erythematous. Neck: No stridor.   Cardiovascular: Normal rate, regular rhythm. Grossly normal heart sounds.  Good peripheral circulation. Respiratory: Normal respiratory effort.  No retractions. Lungs CTAB. Gastrointestinal: Soft but tender in the epigastrium with a positive bedside Murphy. No distention. No abdominal bruits.  Musculoskeletal: No lower extremity tenderness nor edema.  No joint effusions. Neurologic:  Normal speech and language. No gross focal neurologic deficits are appreciated.Skin:  Skin is warm, dry and intact. No rash noted. Psychiatric: Mood and affect are normal. Speech and behavior are normal.  ____________________________________________   LABS (all labs ordered are listed, but only abnormal results are displayed)  Labs Reviewed  CBC WITH DIFFERENTIAL/PLATELET - Abnormal; Notable for the following:    Lymphs Abs 0.6 (*)    All other components within normal limits  COMPREHENSIVE METABOLIC PANEL - Abnormal; Notable for the following:    Potassium 3.2 (*)    Glucose, Bld 134 (*)    AST 143 (*)    ALT 169 (*)    Alkaline Phosphatase 177 (*)    Total Bilirubin 1.4 (*)    All other components within  normal limits  URINALYSIS COMPLETEWITH MICROSCOPIC ([facility] ONLY) - Abnormal; Notable for the following:    Color, Urine AMBER (*)    APPearance CLEAR (*)    Ketones, ur TRACE (*)    Protein, ur 100 (*)    Squamous Epithelial / LPF 0-5 (*)    All other components within normal limits  LIPASE, BLOOD  COMPREHENSIVE METABOLIC PANEL  CBC   ____________________________________________  EKG  ED ECG REPORT I, Akshat Minehart, the attending physician, personally viewed and interpreted this ECG.  Date: 12/31/2015 EKG Time: 1610 Rate:  85 Rhythm: normal sinus rhythm QRS Axis: normal Intervals: normal ST/T Wave abnormalities: normal Conduction Disutrbances: none Narrative Interpretation: unremarkable  ____________________________________________  RADIOLOGY  US Abdomen Limited RUQ (Final result) Result time: 12/31/15 19:14:55   Final result by Rad Results In Interface (12/31/15 19:14:55)   Narrative:   CLINICAL DATA: Patient with right upper quadrant abdominal pain for 6 hours.  EXAM: US ABDOMEN LIMITED - RIGHT UPPER QUADRANT  COMPARISON: None.  FINDINGS: Gallbladder:  Multiple mobile stones are demonstrated within the gallbladder lumen. There is gallbladder wall thickening measuring up to 6 mm. Sonographer reports positive sonographic Murphy's sign.  Common bile duct:  Diameter: 6 mm  Liver:  No focal lesion identified. Within normal limits in parenchymal echogenicity.  IMPRESSION: Multiple gallstones are demonstrated within the gallbladder lumen with secondary signs suggestive of acute cholecystitis.  The common bile duct is upper limits of normal in diameter, recommend correlation with LFTs.   Electronically Signed By: Annia Belt M.D. On: 12/31/2015 19:14    ____________________________________________   PROCEDURES  Procedure(s) performed: None  Critical Care performed: No  ____________________________________________   INITIAL IMPRESSION  / ASSESSMENT AND PLAN / ED COURSE  Pertinent labs & imaging results that were available during my care of the patient were reviewed by me and considered in my medical decision making (see chart for details).  Epigastric and right upper abdominal pain. Labs indicative of mild transaminitis with an associated elevation of alkaline phosphatase. Positive bedside Eulah Pont with gallstones noted on ultrasound possibly concerning for cholecystitis. Patient pain much improved after morphine but continues to have some persistent discomfort. Consult to general surgery who will admit for further evaluation.  Impression at this point is likely early cholecystitis. ____________________________________________   FINAL CLINICAL IMPRESSION(S) / ED DIAGNOSES  Final diagnoses:  Abdominal pain      Sharyn Creamer, MD 12/31/15 2014

## 2015-12-31 NOTE — Telephone Encounter (Signed)
No. More protonix will not be helpful. I would recommend she go to UC or the ED. Needs further evaluation as to cause of chest pain.

## 2015-12-31 NOTE — ED Notes (Signed)
General surgery MD at bedside.

## 2015-12-31 NOTE — Progress Notes (Signed)
Notified MD pt request Nicoderm patch, orders taken per Dr Excell Seltzer

## 2015-12-31 NOTE — H&P (Signed)
Debra Hogan is an 55 y.o. female.    Chief Complaint: rt Upper quadrant pain  HPI: This a patient with unrelenting right upper quadrant pain it is slightly better now after pain medication but it started yesterday. She's had nausea and vomiting but no fevers or chills she denies jaundice acholic stools or dark urine. She has had multiple episodes like this since October and which they are associated with fatty food intolerance but spontaneously resolved after a few hours. 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 She has no family history of gallbladder disease She has had a partial hysterectomy unclear what that meaning is. Has fibromyalgia and Ms and COPD.  Past Medical History  Diagnosis Date  . Fibromyalgia   . Irritable bowel syndrome   . Depression   . Arthritis   . COPD (chronic obstructive pulmonary disease) (HCC)   . MS (multiple sclerosis) (HCC)     Followed by Dr. Manuella Hogan    Past Surgical History  Procedure Laterality Date  . Laparoscopic endometriosis fulguration    . Foot surgery  2001  . Abdominal hysterectomy      Family History  Problem Relation Age of Onset  . Hypertension Mother   . Hyperlipidemia Mother   . Arthritis Mother   . Cancer Father     throat  . Heart disease Brother    Social History:  reports that she has been smoking.  She does not have any smokeless tobacco history on file. She reports that she does not use illicit drugs. Her alcohol history is not on file.  Allergies:  Allergies  Allergen Reactions  . Valium [Diazepam] Other (See Comments)    Anxiety     (Not in a hospital admission)   Review of Systems  Constitutional: Negative for fever, chills and weight loss.  HENT: Negative.   Eyes: Negative.   Respiratory: Negative for cough, hemoptysis, sputum production and shortness of breath.   Cardiovascular: Negative for chest pain, palpitations, orthopnea and claudication.  Gastrointestinal: Positive for  nausea, vomiting and abdominal pain. Negative for heartburn, diarrhea, constipation, blood in stool and melena.  Genitourinary: Negative for dysuria, urgency and frequency.  Musculoskeletal: Negative.   Skin: Negative.   Neurological: Negative.   Endo/Heme/Allergies: Negative.   Psychiatric/Behavioral: Negative.      Physical Exam:  BP 127/72 mmHg  Pulse 74  Temp(Src) 97.6 F (36.4 C) (Oral)  Resp 13  Ht 5' 5"  (1.651 m)  Wt 170 lb (77.111 kg)  BMI 28.29 kg/m2  SpO2 96%  Physical Exam  Constitutional: She is oriented to person, place, and time and well-developed, well-nourished, and in no distress. No distress.  Comfortable-appearing No icterus no jaundice  HENT:  Head: Normocephalic and atraumatic.  Eyes: Pupils are equal, round, and reactive to light. Right eye exhibits no discharge. Left eye exhibits no discharge. No scleral icterus.  Neck: Normal range of motion. Neck supple.  Cardiovascular: Normal rate, regular rhythm and normal heart sounds.   No murmur heard. Pulmonary/Chest: Effort normal and breath sounds normal. No respiratory distress. She has no wheezes. She has no rales.  Abdominal: Soft. She exhibits no distension. There is tenderness. There is no rebound and no guarding.  Tenderness in the right upper quadrant with a positive Murphy sign  Musculoskeletal: Normal range of motion. She exhibits no edema or tenderness.  Lymphadenopathy:    She has no cervical adenopathy.  Neurological: She is alert and oriented to person, place, and  time.  Skin: Skin is warm and dry. No rash noted. She is not diaphoretic. No erythema.  Psychiatric: Mood and affect normal.  Vitals reviewed.       Results for orders placed or performed during the hospital encounter of 12/31/15 (from the past 48 hour(s))  CBC WITH DIFFERENTIAL     Status: Abnormal   Collection Time: 12/31/15  4:21 PM  Result Value Ref Range   WBC 6.9 3.6 - 11.0 K/uL   RBC 4.91 3.80 - 5.20 MIL/uL    Hemoglobin 15.1 12.0 - 16.0 g/dL   HCT 44.8 35.0 - 47.0 %   MCV 91.3 80.0 - 100.0 fL   MCH 30.8 26.0 - 34.0 pg   MCHC 33.8 32.0 - 36.0 g/dL   RDW 13.2 11.5 - 14.5 %   Platelets 209 150 - 440 K/uL   Neutrophils Relative % 81 %   Neutro Abs 5.7 1.4 - 6.5 K/uL   Lymphocytes Relative 9 %   Lymphs Abs 0.6 (L) 1.0 - 3.6 K/uL   Monocytes Relative 8 %   Monocytes Absolute 0.5 0.2 - 0.9 K/uL   Eosinophils Relative 1 %   Eosinophils Absolute 0.1 0 - 0.7 K/uL   Basophils Relative 1 %   Basophils Absolute 0.0 0 - 0.1 K/uL  Comprehensive metabolic panel     Status: Abnormal   Collection Time: 12/31/15  4:21 PM  Result Value Ref Range   Sodium 145 135 - 145 mmol/L   Potassium 3.2 (L) 3.5 - 5.1 mmol/L   Chloride 103 101 - 111 mmol/L   CO2 32 22 - 32 mmol/L   Glucose, Bld 134 (H) 65 - 99 mg/dL   BUN 15 6 - 20 mg/dL   Creatinine, Ser 0.65 0.44 - 1.00 mg/dL   Calcium 10.1 8.9 - 10.3 mg/dL   Total Protein 7.9 6.5 - 8.1 g/dL   Albumin 5.0 3.5 - 5.0 g/dL   AST 143 (H) 15 - 41 U/L   ALT 169 (H) 14 - 54 U/L   Alkaline Phosphatase 177 (H) 38 - 126 U/L   Total Bilirubin 1.4 (H) 0.3 - 1.2 mg/dL   GFR calc non Af Amer >60 >60 mL/min   GFR calc Af Amer >60 >60 mL/min    Comment: (NOTE) The eGFR has been calculated using the CKD EPI equation. This calculation has not been validated in all clinical situations. eGFR's persistently <60 mL/min signify possible Chronic Kidney Disease.    Anion gap 10 5 - 15  Lipase, blood     Status: None   Collection Time: 12/31/15  4:21 PM  Result Value Ref Range   Lipase 32 11 - 51 U/L  Urinalysis complete, with microscopic ([facility] only)     Status: Abnormal   Collection Time: 12/31/15  6:08 PM  Result Value Ref Range   Color, Urine AMBER (A) YELLOW   APPearance CLEAR (A) CLEAR   Glucose, UA NEGATIVE NEGATIVE mg/dL   Bilirubin Urine NEGATIVE NEGATIVE   Ketones, ur TRACE (A) NEGATIVE mg/dL   Specific Gravity, Urine 1.025 1.005 - 1.030   Hgb urine dipstick  NEGATIVE NEGATIVE   pH 6.0 5.0 - 8.0   Protein, ur 100 (A) NEGATIVE mg/dL   Nitrite NEGATIVE NEGATIVE   Leukocytes, UA NEGATIVE NEGATIVE   RBC / HPF 6-30 0 - 5 RBC/hpf   WBC, UA 0-5 0 - 5 WBC/hpf   Bacteria, UA NONE SEEN NONE SEEN   Squamous Epithelial / LPF 0-5 (A) NONE SEEN  Mucous PRESENT    Hyaline Casts, UA PRESENT    Ca Oxalate Crys, UA PRESENT    US Abdomen Limited Ruq  12/31/2015  CLINICAL DATA:  Patient with right upper quadrant abdominal pain for 6 hours. EXAM: US ABDOMEN LIMITED - RIGHT UPPER QUADRANT COMPARISON:  None. FINDINGS: Gallbladder: Multiple mobile stones are demonstrated within the gallbladder lumen. There is gallbladder wall thickening measuring up to 6 mm. Sonographer reports positive sonographic Murphy's sign. Common bile duct: Diameter: 6 mm Liver: No focal lesion identified. Within normal limits in parenchymal echogenicity. IMPRESSION: Multiple gallstones are demonstrated within the gallbladder lumen with secondary signs suggestive of acute cholecystitis. The common bile duct is upper limits of normal in diameter, recommend correlation with LFTs. Electronically Signed   By: Lovey Newcomer M.D.   On: 12/31/2015 19:14     Assessment/Plan  This a patient with recurrent and episodic right upper quadrant pain associated fatty food intolerance is been going on since October. Now it is unrelenting and started yesterday and other then because of some pain medication she is not improved. Her LFTs are been personally reviewed as has her ultrasound all suggestive of choledocholithiasis. My plan is to admit the patient to the hospital and start IV antibiotics IV fluid therapy IV pain medication and anti-emetics. This will be instituted and her LFTs will be trended. Discussed the rationale for this approach and the etiology of her pain and the likelihood that she will require surgery in the near future either tomorrow or Sunday concerning her choledocholithiasis and the potential for  an ERCP the procedure itself was described for her but she has not been formally consented. Multiple questions were answered for she and her husband they understood and agreed with this plan. Over 90 minutes were spent reviewing this patient's chart and in face-to-face encounter.  Florene Glen, MD, FACS

## 2015-12-31 NOTE — ED Notes (Signed)
States "I have been having these attacks since October".  Diagnosed with Reflux. C/O epigastric pain that goes around trunk into back.  Patient taking protonix.  C/O nausea and pain.  Onset of symptoms today at 1400.  Last meal at 1100.

## 2016-01-01 ENCOUNTER — Inpatient Hospital Stay: Payer: 59

## 2016-01-01 LAB — COMPREHENSIVE METABOLIC PANEL
ALT: 214 U/L — ABNORMAL HIGH (ref 14–54)
ANION GAP: 7 (ref 5–15)
AST: 244 U/L — AB (ref 15–41)
Albumin: 3.5 g/dL (ref 3.5–5.0)
Alkaline Phosphatase: 165 U/L — ABNORMAL HIGH (ref 38–126)
BILIRUBIN TOTAL: 1.4 mg/dL — AB (ref 0.3–1.2)
BUN: 9 mg/dL (ref 6–20)
CALCIUM: 8.1 mg/dL — AB (ref 8.9–10.3)
CO2: 28 mmol/L (ref 22–32)
Chloride: 108 mmol/L (ref 101–111)
Creatinine, Ser: 0.51 mg/dL (ref 0.44–1.00)
GFR calc Af Amer: >60 mL/min (ref >60)
Glucose, Bld: 83 mg/dL (ref 65–99)
POTASSIUM: 3.1 mmol/L — AB (ref 3.5–5.1)
Sodium: 143 mmol/L (ref 135–145)
TOTAL PROTEIN: 5.6 g/dL — AB (ref 6.5–8.1)

## 2016-01-01 LAB — CBC
HEMATOCRIT: 37 % (ref 35.0–47.0)
Hemoglobin: 12.7 g/dL (ref 12.0–16.0)
MCH: 32 pg (ref 26.0–34.0)
MCHC: 34.2 g/dL (ref 32.0–36.0)
MCV: 93.4 fL (ref 80.0–100.0)
Platelets: 147 10*3/uL — ABNORMAL LOW (ref 150–440)
RBC: 3.96 MIL/uL (ref 3.80–5.20)
RDW: 13.5 % (ref 11.5–14.5)
WBC: 2.9 10*3/uL — AB (ref 3.6–11.0)

## 2016-01-01 MED ORDER — GADOBENATE DIMEGLUMINE 529 MG/ML IV SOLN
15.0000 mL | Freq: Once | INTRAVENOUS | Status: AC | PRN
  Administered 2016-01-01: 15 mL via INTRAVENOUS

## 2016-01-01 MED ORDER — PNEUMOCOCCAL VAC POLYVALENT 25 MCG/0.5ML IJ INJ
0.5000 mL | INJECTION | INTRAMUSCULAR | Status: AC
  Administered 2016-01-04: 0.5 mL via INTRAMUSCULAR
  Filled 2016-01-01 (×2): qty 0.5

## 2016-01-01 MED ORDER — POTASSIUM CHLORIDE 10 MEQ/100ML IV SOLN
10.0000 meq | INTRAVENOUS | Status: AC
  Administered 2016-01-01 (×2): 10 meq via INTRAVENOUS
  Filled 2016-01-01 (×3): qty 100

## 2016-01-01 MED ORDER — ACYCLOVIR 200 MG PO CAPS: 400.0000 mg | ORAL_CAPSULE | Freq: Every day | ORAL | Status: DC

## 2016-01-01 NOTE — Progress Notes (Signed)
CC: Pain Subjective: Patient admitted overnight for question of abdominal pain from a biliary origin. Patient has multiple pain complaints morning. Her primary complaint of pain is from her head and her lower Sharma days on my exam today. Patient states she is very hungry and thinks that her problems better if she can eat. She denies any fevers, chills, nausea, vomiting. The pain that she came in with she states is mostly gone.  Objective: Vital signs in last 24 hours: Temp:  [97.6 F (36.4 C)-98.2 F (36.8 C)] 98.1 F (36.7 C) (01/07 0938) Pulse Rate:  [74-95] 80 (01/07 0938) Resp:  [12-25] 18 (01/07 0938) BP: (118-158)/(70-90) 147/70 mmHg (01/07 0938) SpO2:  [90 %-98 %] 90 % (01/07 0938) Weight:  [69.945 kg (154 lb 3.2 oz)-77.111 kg (170 lb)] 69.945 kg (154 lb 3.2 oz) (01/06 2103) Last BM Date: 12/31/15  Intake/Output from previous day: 01/06 0701 - 01/07 0700 In: 1142.5 [P.O.:480; I.V.:662.5] Out: 550 [Urine:550] Intake/Output this shift: Total I/O In: 768.8 [I.V.:568.8; IV Piggyback:200] Out: 375 [Urine:375]  Physical exam:  Gen.: No acute distress  chest: Clear to all sedation Heart: Regular rhythm Abdomen: Soft, nondistended, minimally tender to deep palpation in the upper abdomen but without a Murphy sign on my exam this morning  Lab Results: CBC   Recent Labs  12/31/15 1621 01/01/16 0422  WBC 6.9 2.9*  HGB 15.1 12.7  HCT 44.8 37.0  PLT 209 147*   BMET  Recent Labs  12/31/15 1621 01/01/16 0422  NA 145 143  K 3.2* 3.1*  CL 103 108  CO2 32 28  GLUCOSE 134* 83  BUN 15 9  CREATININE 0.65 0.51  CALCIUM 10.1 8.1*   PT/INR No results for input(s): LABPROT, INR in the last 72 hours. ABG No results for input(s): PHART, HCO3 in the last 72 hours.  Invalid input(s): PCO2, PO2  Studies/Results: US Abdomen Limited Ruq  12/31/2015  CLINICAL DATA:  Patient with right upper quadrant abdominal pain for 6 hours. EXAM: US ABDOMEN LIMITED - RIGHT UPPER QUADRANT  COMPARISON:  None. FINDINGS: Gallbladder: Multiple mobile stones are demonstrated within the gallbladder lumen. There is gallbladder wall thickening measuring up to 6 mm. Sonographer reports positive sonographic Murphy's sign. Common bile duct: Diameter: 6 mm Liver: No focal lesion identified. Within normal limits in parenchymal echogenicity. IMPRESSION: Multiple gallstones are demonstrated within the gallbladder lumen with secondary signs suggestive of acute cholecystitis. The common bile duct is upper limits of normal in diameter, recommend correlation with LFTs. Electronically Signed   By: Annia Belt M.D.   On: 12/31/2015 19:14    Anti-infectives: Anti-infectives    Start     Dose/Rate Route Frequency Ordered Stop   01/01/16 1000  acyclovir (ZOVIRAX) 200 MG capsule 400 mg     400 mg Oral Daily 01/01/16 0339     12/31/15 2015  acyclovir (ZOVIRAX) tablet 400 mg  Status:  Discontinued     400 mg Oral Daily 12/31/15 2002 01/01/16 0339   12/31/15 2015  Ampicillin-Sulbactam (UNASYN) 3 g in sodium chloride 0.9 % 100 mL IVPB     3 g 100 mL/hr over 60 Minutes Intravenous Every 6 hours 12/31/15 2009        Assessment/Plan:  55 year old female admitted with complex abdominal pain started to be biliary in origin. Pain is mostly resolved my exam is morning. However, her bilirubin remains slightly elevated and her LFTs have actually worsened. Plan to obtain an MRCP. If this exam can be obtained today and  we will obtain that today and then review the results of the patient. If he cannot be obtained until tomorrow and we'll let her eat and nothing by mouth after midnight. We'll follow up after MRCP.  Daxson Reffett T. Tonita Cong, MD, FACS  01/01/2016

## 2016-01-02 DIAGNOSIS — K8043 Calculus of bile duct with acute cholecystitis with obstruction: Secondary | ICD-10-CM | POA: Diagnosis not present

## 2016-01-02 LAB — COMPREHENSIVE METABOLIC PANEL WITH GFR
ALT: 134 U/L — ABNORMAL HIGH (ref 14–54)
AST: 79 U/L — ABNORMAL HIGH (ref 15–41)
Albumin: 3.5 g/dL (ref 3.5–5.0)
Alkaline Phosphatase: 128 U/L — ABNORMAL HIGH (ref 38–126)
Anion gap: 7 (ref 5–15)
BUN: 8 mg/dL (ref 6–20)
CO2: 30 mmol/L (ref 22–32)
Calcium: 8.9 mg/dL (ref 8.9–10.3)
Chloride: 107 mmol/L (ref 101–111)
Creatinine, Ser: 0.6 mg/dL (ref 0.44–1.00)
GFR calc Af Amer: >60 mL/min
GFR calc non Af Amer: >60 mL/min
Glucose, Bld: 92 mg/dL (ref 65–99)
Potassium: 3.1 mmol/L — ABNORMAL LOW (ref 3.5–5.1)
Sodium: 144 mmol/L (ref 135–145)
Total Bilirubin: 0.5 mg/dL (ref 0.3–1.2)
Total Protein: 5.8 g/dL — ABNORMAL LOW (ref 6.5–8.1)

## 2016-01-02 LAB — CBC
HCT: 36.6 % (ref 35.0–47.0)
Hemoglobin: 12.5 g/dL (ref 12.0–16.0)
MCH: 31.8 pg (ref 26.0–34.0)
MCHC: 34.2 g/dL (ref 32.0–36.0)
MCV: 93 fL (ref 80.0–100.0)
Platelets: 156 K/uL (ref 150–440)
RBC: 3.93 MIL/uL (ref 3.80–5.20)
RDW: 13 % (ref 11.5–14.5)
WBC: 3.5 K/uL — ABNORMAL LOW (ref 3.6–11.0)

## 2016-01-02 LAB — MRSA PCR SCREENING: MRSA by PCR: NEGATIVE

## 2016-01-02 MED ORDER — KCL IN DEXTROSE-NACL 20-5-0.45 MEQ/L-%-% IV SOLN
INTRAVENOUS | Status: DC
  Administered 2016-01-02 – 2016-01-05 (×7): via INTRAVENOUS
  Filled 2016-01-02 (×12): qty 1000

## 2016-01-02 MED ORDER — ACETAMINOPHEN 325 MG PO TABS
650.0000 mg | ORAL_TABLET | Freq: Three times a day (TID) | ORAL | Status: DC | PRN
  Administered 2016-01-02 – 2016-01-05 (×5): 650 mg via ORAL
  Filled 2016-01-02 (×5): qty 2

## 2016-01-02 MED ORDER — POTASSIUM CHLORIDE CRYS ER 20 MEQ PO TBCR
40.0000 meq | EXTENDED_RELEASE_TABLET | Freq: Once | ORAL | Status: AC
  Administered 2016-01-02: 40 meq via ORAL
  Filled 2016-01-02: qty 2

## 2016-01-02 MED ORDER — POTASSIUM CHLORIDE 10 MEQ/100ML IV SOLN
10.0000 meq | INTRAVENOUS | Status: DC
  Filled 2016-01-02 (×4): qty 100

## 2016-01-02 NOTE — Consult Note (Signed)
[facility]  [address], [address] [place], [place] [address] Phone: [phone] Fax : [phone]  Consultation  Referring Provider:     No ref. provider found Primary Care Physician:  Wynona Dove, MD Primary Gastroenterologist:  None         Reason for Consultation:     Common bile duct stone  Date of Admission:  12/31/2015 Date of Consultation:  01/02/2016         HPI:   Debra Hogan is a 55 y.o. female who was admitted with abdominal pain that was thought to be from biliary origin. The patient was found to have multiple stones in her gallbladder. The patient had increased liver enzymes and was set up for an MRCP which showed the patient to have a 5 mm common bile duct stone. The patient's liver enzymes started to trend downwards today. The patient continues to have discomfort. She denies ever seeing a gastroenterologist in the past. I'm now asked to see the patient for a common bile duct stone and possible ERCP.  Past Medical History  Diagnosis Date  . Fibromyalgia   . Irritable bowel syndrome   . Depression   . Arthritis   . COPD (chronic obstructive pulmonary disease) (HCC)   . MS (multiple sclerosis) (HCC)     Followed by Dr. Sherryll Burger    Past Surgical History  Procedure Laterality Date  . Laparoscopic endometriosis fulguration    . Foot surgery  2001  . Abdominal hysterectomy      Prior to Admission medications   Medication Sig Start Date End Date Taking? Authorizing Provider  acyclovir (ZOVIRAX) 400 MG tablet Take 400 mg by mouth daily.   Yes Historical Provider, MD  albuterol (PROVENTIL HFA;VENTOLIN HFA) 108 (90 BASE) MCG/ACT inhaler Inhale 2 puffs into the lungs every 6 (six) hours as needed for wheezing or shortness of breath. 07/23/15  Yes Tommie Sams, DO  ALPRAZolam (XANAX) 0.25 MG tablet Take 1 tablet (0.25 mg total) by mouth 3 (three) times daily as needed for sleep or anxiety. 10/21/15  Yes Shelia Media, MD  diclofenac sodium (VOLTAREN)  1 % GEL Apply 2 g topically 4 (four) times daily as needed (for pain).    Yes Historical Provider, MD  Dimethyl Fumarate (TECFIDERA) 240 MG CPDR Take 240 mg by mouth 2 (two) times daily.   Yes Historical Provider, MD  Fluticasone-Salmeterol (ADVAIR DISKUS) 100-50 MCG/DOSE AEPB Inhale 1 puff into the lungs 2 (two) times daily as needed (for shortness of breath).   Yes Historical Provider, MD  oxyCODONE (OXY IR/ROXICODONE) 5 MG immediate release tablet Take 5 mg by mouth every 6 (six) hours as needed for severe pain.    Yes Historical Provider, MD  pantoprazole (PROTONIX) 40 MG tablet Take 1 tablet (40 mg total) by mouth daily. 10/21/15  Yes Shelia Media, MD    Family History  Problem Relation Age of Onset  . Hypertension Mother   . Hyperlipidemia Mother   . Arthritis Mother   . Cancer Father     throat  . Heart disease Brother      Social History  Substance Use Topics  . Smoking status: Current Every Day Smoker -- 1.00 packs/day    Last Attempt to Quit: 10/21/2008  . Smokeless tobacco: None  . Alcohol Use: None    Allergies as of 12/31/2015 - Review Complete 12/31/2015  Allergen Reaction Noted  . Valium [diazepam] Anxiety 10/21/2012    Review of Systems:  All systems reviewed and negative except where noted in HPI.   Physical Exam:  Vital signs in last 24 hours: Temp:  [98 F (36.7 C)-98.7 F (37.1 C)] 98 F (36.7 C) (01/08 0506) Pulse Rate:  [80-86] 86 (01/08 0506) Resp:  [18-20] 20 (01/08 0506) BP: (140-162)/(70-78) 162/78 mmHg (01/08 0506) SpO2:  [90 %-94 %] 92 % (01/08 0506) Last BM Date: 12/31/15 General:   Pleasant, cooperative in NAD Head:  Normocephalic and atraumatic. Eyes:   No icterus.   Conjunctiva pink. PERRLA. Ears:  Normal auditory acuity. Lungs: Respirations even and unlabored. Lungs clear to auscultation bilaterally.   No wheezes, crackles, or rhonchi.  Heart:  Regular rate and rhythm;  Without murmur, clicks, rubs or gallops Abdomen:  Soft,  nondistended, positive right upper quadrant tenderness. Normal bowel sounds. No appreciable masses or hepatomegaly.  No rebound or guarding.  Rectal:  Not performed. Msk:  Symmetrical without gross deformities.    Extremities:  Without edema, cyanosis or clubbing. Neurologic:  Alert and oriented x3;  grossly normal neurologically. Skin:  Intact without significant lesions or rashes. Cervical Nodes:  No significant cervical adenopathy. Psych:  Alert and cooperative. Normal affect.  LAB RESULTS:  Recent Labs  12/31/15 1621 01/01/16 0422 01/02/16 0543  WBC 6.9 2.9* 3.5*  HGB 15.1 12.7 12.5  HCT 44.8 37.0 36.6  PLT 209 147* 156   BMET  Recent Labs  12/31/15 1621 01/01/16 0422 01/02/16 0543  NA 145 143 144  K 3.2* 3.1* 3.1*  CL 103 108 107  CO2 32 28 30  GLUCOSE 134* 83 92  BUN CREATININE 0.65 0.51 0.60  CALCIUM 10.1 8.1* 8.9   LFT  Recent Labs  01/02/16 0543  PROT 5.8*  ALBUMIN 3.5  AST 79*  ALT 134*  ALKPHOS 128*  BILITOT 0.5   PT/INR No results for input(s): LABPROT, INR in the last 72 hours.  STUDIES: Mr Jorja Loa Cm/mrcp  01/01/2016  CLINICAL DATA:  right upper quadrant pain. EXAM: MRI ABDOMEN WITHOUT AND WITH CONTRAST (INCLUDING MRCP) TECHNIQUE: Multiplanar multisequence MR imaging of the abdomen was performed both before and after the administration of intravenous contrast. Heavily T2-weighted images of the biliary and pancreatic ducts were obtained, and three-dimensional MRCP images were rendered by post processing. CONTRAST:  15mL MULTIHANCE GADOBENATE DIMEGLUMINE 529 MG/ML IV SOLN COMPARISON:  10/17/2013 FINDINGS: Lower chest: There are small bilateral pleural effusions. Atelectasis noted in the right base. Hepatobiliary: No focal liver abnormality identified. Multiple small stones are noted within the gallbladder. The largest measures 5 mm. Mild pericholecystic fluid noted. No gallbladder wall thickening. The common bile duct measures up to 6 mm. There  is a stone within the distal common bile duct measuring 5 mm, image 24 of series 12. Pancreas: Normal appearance of the pancreas. Spleen: The spleen is unremarkable. Adrenals/Urinary Tract: Normal appearance of the adrenal glands. 8 mm cyst arises from the inferior pole of right kidney. The kidneys are otherwise unremarkable. Stomach/Bowel: The stomach is normal. No abnormal dilatation of the upper abdominal bowel loops. Vascular/Lymphatic: Normal caliber of the abdominal aorta. Other: No free fluid or fluid collections identified within the upper abdomen. Musculoskeletal: Normal signal from within the bone marrow. IMPRESSION: 1. Gallstones. 2. Choledocholithiasis. 5 mm stone is identified within the distal common bile duct. Electronically Signed   By: Signa Kell M.D.   On: 01/01/2016 18:02   US Abdomen Limited Ruq  12/31/2015  CLINICAL DATA:  Patient with right upper quadrant abdominal  pain for 6 hours. EXAM: US ABDOMEN LIMITED - RIGHT UPPER QUADRANT COMPARISON:  None. FINDINGS: Gallbladder: Multiple mobile stones are demonstrated within the gallbladder lumen. There is gallbladder wall thickening measuring up to 6 mm. Sonographer reports positive sonographic Murphy's sign. Common bile duct: Diameter: 6 mm Liver: No focal lesion identified. Within normal limits in parenchymal echogenicity. IMPRESSION: Multiple gallstones are demonstrated within the gallbladder lumen with secondary signs suggestive of acute cholecystitis. The common bile duct is upper limits of normal in diameter, recommend correlation with LFTs. Electronically Signed   By: Annia Belt M.D.   On: 12/31/2015 19:14      Impression / Plan:   Debra Hogan is a 55 y.o. y/o female with who has abdominal pain and a history of fibromyalgia. The patient was found to have multiple stones in her gallbladder and a 5 mm stone in her common bile duct on an MRCP. The patient will need an ERCP in the future. If she has a laparoscopic cholecystectomy  today we will do the ERCP tomorrow. If not and it Podoll need to be done later in the week. The patient has been given the risks and benefits including pancreatitis perforation bleeding and death. I discussed the care and plan with Dr. Tonita Cong and he agrees.   Thank you for involving me in the care of this patient.      LOS: 2 days   Darlina Rumpf, MD  01/02/2016, 8:39 AM   Note: This dictation was prepared with Dragon dictation along with smaller phrase technology. Any transcriptional errors that result from this process are unintentional.

## 2016-01-02 NOTE — Progress Notes (Signed)
CC: Pain Subjective: Patient found to have MRCP evidence of choledocholithiasis yesterday. Since that time patient has had multiple episodes of multifocal pain consistent with her chronic pains from MS and fibromyalgia. Patient states she was able tolerate a diet last night without difficulty and is very hungry today. Her abdominal pain for which she was admitted for appears to have improved. She denies any fevers, chills, nausea, vomiting.  Objective: Vital signs in last 24 hours: Temp:  [98 F (36.7 C)-98.7 F (37.1 C)] 98 F (36.7 C) (01/08 0506) Pulse Rate:  [82-86] 86 (01/08 0506) Resp:  [18-20] 20 (01/08 0506) BP: (140-162)/(74-78) 162/78 mmHg (01/08 0506) SpO2:  [92 %-94 %] 92 % (01/08 0506) Last BM Date: 12/31/15  Intake/Output from previous day: 01/07 0701 - 01/08 0700 In: 3368.8 [I.V.:3068.8; IV Piggyback:300] Out: 1700 [Urine:1700] Intake/Output this shift: Total I/O In: 675 [I.V.:675] Out: -   Physical exam:  Gen.: No acute distress  chest: Clear to auscultation Heart: Regular rate and rhythm Abdomen: Soft, nondistended, tender to palpation in all quadrants on deep palpation. Negative Murphy sign.  Lab Results: CBC   Recent Labs  01/01/16 0422 01/02/16 0543  WBC 2.9* 3.5*  HGB 12.7 12.5  HCT 37.0 36.6  PLT 147* 156   BMET  Recent Labs  01/01/16 0422 01/02/16 0543  NA 143 144  K 3.1* 3.1*  CL 108 107  CO2 28 30  GLUCOSE 83 92  BUN 9 8  CREATININE 0.51 0.60  CALCIUM 8.1* 8.9   PT/INR No results for input(s): LABPROT, INR in the last 72 hours. ABG No results for input(s): PHART, HCO3 in the last 72 hours.  Invalid input(s): PCO2, PO2  Studies/Results: Mr Debra Hogan Cm/mrcp  01/01/2016  CLINICAL DATA:  right upper quadrant pain. EXAM: MRI ABDOMEN WITHOUT AND WITH CONTRAST (INCLUDING MRCP) TECHNIQUE: Multiplanar multisequence MR imaging of the abdomen was performed both before and after the administration of intravenous contrast. Heavily  T2-weighted images of the biliary and pancreatic ducts were obtained, and three-dimensional MRCP images were rendered by post processing. CONTRAST:  15mL MULTIHANCE GADOBENATE DIMEGLUMINE 529 MG/ML IV SOLN COMPARISON:  10/17/2013 FINDINGS: Lower chest: There are small bilateral pleural effusions. Atelectasis noted in the right base. Hepatobiliary: No focal liver abnormality identified. Multiple small stones are noted within the gallbladder. The largest measures 5 mm. Mild pericholecystic fluid noted. No gallbladder wall thickening. The common bile duct measures up to 6 mm. There is a stone within the distal common bile duct measuring 5 mm, image 24 of series 12. Pancreas: Normal appearance of the pancreas. Spleen: The spleen is unremarkable. Adrenals/Urinary Tract: Normal appearance of the adrenal glands. 8 mm cyst arises from the inferior pole of right kidney. The kidneys are otherwise unremarkable. Stomach/Bowel: The stomach is normal. No abnormal dilatation of the upper abdominal bowel loops. Vascular/Lymphatic: Normal caliber of the abdominal aorta. Other: No free fluid or fluid collections identified within the upper abdomen. Musculoskeletal: Normal signal from within the bone marrow. IMPRESSION: 1. Gallstones. 2. Choledocholithiasis. 5 mm stone is identified within the distal common bile duct. Electronically Signed   By: Signa Kell M.D.   On: 01/01/2016 18:02   US Abdomen Limited Ruq  12/31/2015  CLINICAL DATA:  Patient with right upper quadrant abdominal pain for 6 hours. EXAM: US ABDOMEN LIMITED - RIGHT UPPER QUADRANT COMPARISON:  None. FINDINGS: Gallbladder: Multiple mobile stones are demonstrated within the gallbladder lumen. There is gallbladder wall thickening measuring up to 6 mm. Sonographer reports positive sonographic  Murphy's sign. Common bile duct: Diameter: 6 mm Liver: No focal lesion identified. Within normal limits in parenchymal echogenicity. IMPRESSION: Multiple gallstones are  demonstrated within the gallbladder lumen with secondary signs suggestive of acute cholecystitis. The common bile duct is upper limits of normal in diameter, recommend correlation with LFTs. Electronically Signed   By: Annia Belt M.D.   On: 12/31/2015 19:14    Anti-infectives: Anti-infectives    Start     Dose/Rate Route Frequency Ordered Stop   01/01/16 1000  acyclovir (ZOVIRAX) 200 MG capsule 400 mg     400 mg Oral Daily 01/01/16 0339     12/31/15 2015  acyclovir (ZOVIRAX) tablet 400 mg  Status:  Discontinued     400 mg Oral Daily 12/31/15 2002 01/01/16 0339   12/31/15 2015  Ampicillin-Sulbactam (UNASYN) 3 g in sodium chloride 0.9 % 100 mL IVPB     3 g 100 mL/hr over 60 Minutes Intravenous Every 6 hours 12/31/15 2009        Assessment/Plan:  55 year old female with choledocholithiasis. Patient's exam and labs were suggested that she [name] have passed the stone from her common bile duct. Discussed with gastroenterology a plan of neck performing a laparoscopic cholecystectomy with cholangiogram. Should cholangiogram showed continued evidence of common bile duct stone the next step would be an ERCP to follow-up. Discussed with the patient the option performing her surgery today. However, given the multiple cases already booked for the operating room I was unable to guarantee that we will be able to perform her surgery today. Given this the patient says that she would prefer to eat with a schedule surgery in the open operating room time on Monday. Will start regular diet. Replete potassium. Nothing by mouth after midnight with plans for a laparoscopic cholecystectomy with intraoperative cholangiogram on Monday with Dr. Excell Seltzer.  [name], MD, FACS  01/02/2016

## 2016-01-02 NOTE — Progress Notes (Signed)
Patient requests to have oral potassium stating that the IV burns when infusing.  Dr. Tonita Cong was notified and gave telephone order to discontinue IV and order orally.

## 2016-01-03 ENCOUNTER — Encounter: Payer: Self-pay | Admitting: *Deleted

## 2016-01-03 ENCOUNTER — Inpatient Hospital Stay: Payer: 59

## 2016-01-03 ENCOUNTER — Inpatient Hospital Stay: Payer: 59 | Admitting: Certified Registered"

## 2016-01-03 ENCOUNTER — Encounter
Admission: EM | Disposition: A | Discharge status: Home / Self Care | Payer: Self-pay | Source: Home / Self Care | Attending: Surgery

## 2016-01-03 DIAGNOSIS — K8043 Calculus of bile duct with acute cholecystitis with obstruction: Secondary | ICD-10-CM | POA: Diagnosis not present

## 2016-01-03 HISTORY — PX: CHOLECYSTECTOMY: SHX55

## 2016-01-03 SURGERY — LAPAROSCOPIC CHOLECYSTECTOMY WITH INTRAOPERATIVE CHOLANGIOGRAM
Anesthesia: General | Wound class: Clean Contaminated

## 2016-01-03 MED ORDER — DEXAMETHASONE SODIUM PHOSPHATE 4 MG/ML IJ SOLN
INTRAMUSCULAR | Status: DC | PRN
  Administered 2016-01-03: 5 mg via INTRAVENOUS

## 2016-01-03 MED ORDER — IOTHALAMATE MEGLUMINE 60 % INJ SOLN
INTRAMUSCULAR | Status: DC | PRN
  Administered 2016-01-03: 12 mL

## 2016-01-03 MED ORDER — PROMETHAZINE HCL 25 MG/ML IJ SOLN
6.2500 mg | INTRAMUSCULAR | Status: DC | PRN
  Administered 2016-01-03: 6.25 mg via INTRAVENOUS

## 2016-01-03 MED ORDER — PROPOFOL 10 MG/ML IV BOLUS
INTRAVENOUS | Status: DC | PRN
  Administered 2016-01-03: 200 mg via INTRAVENOUS

## 2016-01-03 MED ORDER — PROMETHAZINE HCL 25 MG/ML IJ SOLN
INTRAMUSCULAR | Status: AC
  Filled 2016-01-03: qty 1

## 2016-01-03 MED ORDER — BUPIVACAINE-EPINEPHRINE (PF) 0.25% -1:200000 IJ SOLN
INTRAMUSCULAR | Status: DC | PRN
  Administered 2016-01-03: 30 mL

## 2016-01-03 MED ORDER — MIDAZOLAM HCL 2 MG/2ML IJ SOLN
INTRAMUSCULAR | Status: DC | PRN
  Administered 2016-01-03 (×2): 1 mg via INTRAVENOUS

## 2016-01-03 MED ORDER — SUGAMMADEX SODIUM 200 MG/2ML IV SOLN
INTRAVENOUS | Status: DC | PRN
  Administered 2016-01-03: 150 mg via INTRAVENOUS

## 2016-01-03 MED ORDER — FENTANYL CITRATE (PF) 100 MCG/2ML IJ SOLN
25.0000 ug | INTRAMUSCULAR | Status: DC | PRN
  Administered 2016-01-03 (×4): 25 ug via INTRAVENOUS

## 2016-01-03 MED ORDER — LIDOCAINE HCL (CARDIAC) 20 MG/ML IV SOLN
INTRAVENOUS | Status: DC | PRN
  Administered 2016-01-03: 50 mg via INTRAVENOUS

## 2016-01-03 MED ORDER — ACYCLOVIR 400 MG PO TABS: 400.0000 mg | ORAL_TABLET | Freq: Every day | ORAL | Status: DC

## 2016-01-03 MED ORDER — FENTANYL CITRATE (PF) 100 MCG/2ML IJ SOLN
INTRAMUSCULAR | Status: DC | PRN
  Administered 2016-01-03: 50 ug via INTRAVENOUS
  Administered 2016-01-03: 150 ug via INTRAVENOUS
  Administered 2016-01-03: 50 ug via INTRAVENOUS

## 2016-01-03 MED ORDER — ROCURONIUM BROMIDE 100 MG/10ML IV SOLN
INTRAVENOUS | Status: DC | PRN
  Administered 2016-01-03: 30 mg via INTRAVENOUS

## 2016-01-03 MED ORDER — ACETAMINOPHEN 10 MG/ML IV SOLN
INTRAVENOUS | Status: DC | PRN
  Administered 2016-01-03: 1000 mg via INTRAVENOUS

## 2016-01-03 MED ORDER — DIMETHYL FUMARATE 240 MG PO CPDR: 240.0000 mg | DELAYED_RELEASE_CAPSULE | Freq: Two times a day (BID) | ORAL | Status: DC

## 2016-01-03 MED ORDER — SODIUM CHLORIDE 0.9 % IJ SOLN
INTRAMUSCULAR | Status: AC
  Filled 2016-01-03: qty 10

## 2016-01-03 MED ORDER — SODIUM CHLORIDE 0.9 % IV SOLN
INTRAVENOUS | Status: DC
  Administered 2016-01-03: 12:00:00 via INTRAVENOUS

## 2016-01-03 MED ORDER — FENTANYL CITRATE (PF) 100 MCG/2ML IJ SOLN
INTRAMUSCULAR | Status: AC
  Administered 2016-01-03: 25 ug via INTRAVENOUS
  Filled 2016-01-03: qty 2

## 2016-01-03 MED ORDER — ONDANSETRON HCL 4 MG/2ML IJ SOLN
INTRAMUSCULAR | Status: DC | PRN
  Administered 2016-01-03: 4 mg via INTRAVENOUS

## 2016-01-03 SURGICAL SUPPLY — 42 items
ADHESIVE MASTISOL STRL (MISCELLANEOUS) ×3 IMPLANT
APPLIER CLIP ROT 10 11.4 M/L (STAPLE) ×3
BLADE SURG SZ11 CARB STEEL (BLADE) ×3 IMPLANT
CANISTER SUCT 1200ML W/VALVE (MISCELLANEOUS) ×3 IMPLANT
CATH CHOLANGI 4FR 420404F (CATHETERS) ×3 IMPLANT
CHLORAPREP W/TINT 26ML (MISCELLANEOUS) ×3 IMPLANT
CLIP APPLIE ROT 10 11.4 M/L (STAPLE) ×1 IMPLANT
CLOSURE WOUND 1/2 X4 (GAUZE/BANDAGES/DRESSINGS) ×1
CONRAY 60ML FOR OR (MISCELLANEOUS) ×3 IMPLANT
DRAPE C-ARM XRAY 36X54 (DRAPES) ×3 IMPLANT
ENDOPOUCH RETRIEVER 10 (MISCELLANEOUS) ×3 IMPLANT
GAUZE SPONGE NON-WVN 2X2 STRL (MISCELLANEOUS) ×4 IMPLANT
GLOVE BIO SURGEON STRL SZ8 (GLOVE) ×3 IMPLANT
GOWN STRL REUS W/ TWL LRG LVL3 (GOWN DISPOSABLE) ×4 IMPLANT
GOWN STRL REUS W/TWL LRG LVL3 (GOWN DISPOSABLE) ×8
IRRIGATION STRYKERFLOW (MISCELLANEOUS) ×1 IMPLANT
IRRIGATOR STRYKERFLOW (MISCELLANEOUS) ×3
IV CATH ANGIO 12GX3 LT BLUE (NEEDLE) ×3 IMPLANT
IV NS 1000ML (IV SOLUTION) ×2
IV NS 1000ML BAXH (IV SOLUTION) ×1 IMPLANT
JACKSON PRATT 10 (INSTRUMENTS) ×3 IMPLANT
KIT RM TURNOVER STRD PROC AR (KITS) ×3 IMPLANT
LABEL OR SOLS (LABEL) ×3 IMPLANT
NDL SAFETY 22GX1.5 (NEEDLE) ×3 IMPLANT
NEEDLE VERESS 14GA 120MM (NEEDLE) ×3 IMPLANT
NS IRRIG 500ML POUR BTL (IV SOLUTION) ×3 IMPLANT
PACK LAP CHOLECYSTECTOMY (MISCELLANEOUS) ×3 IMPLANT
PAD GROUND ADULT SPLIT (MISCELLANEOUS) ×3 IMPLANT
SCISSORS METZENBAUM CVD 33 (INSTRUMENTS) ×3 IMPLANT
SLEEVE ENDOPATH XCEL 5M (ENDOMECHANICALS) ×3 IMPLANT
SPONGE EXCIL AMD DRAIN 4X4 6P (MISCELLANEOUS) ×3 IMPLANT
SPONGE LAP 18X18 5 PK (GAUZE/BANDAGES/DRESSINGS) ×3 IMPLANT
SPONGE VERSALON 2X2 STRL (MISCELLANEOUS) ×8
STRIP CLOSURE SKIN 1/2X4 (GAUZE/BANDAGES/DRESSINGS) ×2 IMPLANT
SUT MNCRL 4-0 (SUTURE) ×2
SUT MNCRL 4-0 27XMFL (SUTURE) ×1
SUT VICRYL 0 AB UR-6 (SUTURE) ×3 IMPLANT
SUTURE MNCRL 4-0 27XMF (SUTURE) ×1 IMPLANT
SYR 20CC LL (SYRINGE) ×3 IMPLANT
TROCAR XCEL NON-BLD 11X100MML (ENDOMECHANICALS) ×3 IMPLANT
TROCAR XCEL NON-BLD 5MMX100MML (ENDOMECHANICALS) ×6 IMPLANT
TUBING INSUFFLATOR HI FLOW (MISCELLANEOUS) ×3 IMPLANT

## 2016-01-03 NOTE — Op Note (Signed)
Laparoscopic Cholecystectomy  Pre-operative Diagnosis: Choledocholithiasis  Post-operative Diagnosis: Choledocholithiasis  Procedure: Laparoscopic cholecystectomy with C-arm fluoroscopic cholangiography  Surgeon: Adah Salvage. Excell Seltzer, MD FACS  Anesthesia: Gen. with endotracheal tube  Assistant: Surgical tech  Procedure Details  The patient was seen again in the Holding Room. The benefits, complications, treatment options, and expected outcomes were discussed with the patient. The risks of bleeding, infection, recurrence of symptoms, failure to resolve symptoms, bile duct damage, bile duct leak, retained common bile duct stone, bowel injury, any of which could require further surgery and/or ERCP, stent, or papillotomy were reviewed with the patient. The likelihood of improving the patient's symptoms with return to their baseline status is good.  The patient and/or family concurred with the proposed plan, giving informed consent.  The patient was taken to Operating Room, identified as Debra Hogan and the procedure verified as Laparoscopic Cholecystectomy.  A Time Out was held and the above information confirmed.  Prior to the induction of general anesthesia, antibiotic prophylaxis was administered. VTE prophylaxis was in place. General endotracheal anesthesia was then administered and tolerated well. After the induction, the abdomen was prepped with Chloraprep and draped in the sterile fashion. The patient was positioned in the supine position.  Local anesthetic  was injected into the skin near the umbilicus and an incision made. The Veress needle was placed. Pneumoperitoneum was then created with CO2 and tolerated well without any adverse changes in the patient's vital signs. A 48mm port was placed in the periumbilical position and the abdominal cavity was explored.  Two 5-mm ports were placed in the right upper quadrant and a 12 mm epigastric port was placed all under direct vision. All skin  incisions  were infiltrated with a local anesthetic agent before making the incision and placing the trocars.   The patient was positioned  in reverse Trendelenburg, tilted slightly to the patient's left.  The gallbladder was identified, the fundus grasped and retracted cephalad. Adhesions were lysed bluntly. The infundibulum was grasped and retracted laterally, exposing the peritoneum overlying the triangle of Calot. This was then divided and exposed in a blunt fashion. A critical view of the cystic duct and cystic artery was obtained.  The cystic duct was clearly identified and bluntly dissected.   The cystic artery was doubly clipped and divided the cystic duct was then clipped and incised and through a separate incision and Angiocath a cholangiogram catheter was placed. C-arm fluoroscopic angiography demonstrated that the cystic duct had been cannulated and at the proximal ducts were well identified there was a 5 mm since he at the ampulla suggestive of a retained stone. The cholangiocatheter was removed and the cystic duct was doubly clipped and divided  The gallbladder was taken from the gallbladder fossa in a retrograde fashion with the electrocautery. The gallbladder was removed and placed in an Endocatch bag. The liver bed was irrigated and inspected. Hemostasis was achieved with the electrocautery. Copious irrigation was utilized and was repeatedly aspirated until clear.  The gallbladder and Endocatch sac were then removed through the epigastric port site.   A 10 mm JP drain was placed in the foramen of Winslow and brought out through a lateral port site and held in with 3-0 nylon suture.  Inspection of the right upper quadrant was performed. No bleeding, bile duct injury or leak, or bowel injury was noted. Pneumoperitoneum was released.  The epigastric port site was closed with figure-of-eight 0 Vicryl sutures. 4-0 subcuticular Monocryl was used to close the skin.  Steristrips and Mastisol and  sterile dressings were  applied.  The patient was then extubated and brought to the recovery room in stable condition. Sponge, lap, and needle counts were correct at closure and at the conclusion of the case.   Findings: Acute Cholecystitis with choledocholithiasis  Estimated Blood Loss: 20 cc         Drains: JP 1         Specimens: Gallbladder           Complications: none, except the presence of a retained stone seen on cholangiography.               Debra Guard E. Excell Seltzer, MD, FACS

## 2016-01-03 NOTE — Progress Notes (Signed)
Patient was seen again in the preop holding area in the presence of her husband and an Charity fundraiser. I described the rationale for surgery and the options and discussed the risks of a retained stone and the need for a postoperative ERCP and all of the other risks that we have discussed previously. She had no further questions and wants to proceed.

## 2016-01-03 NOTE — Anesthesia Procedure Notes (Signed)
Procedure Name: Intubation Performed by: Dalyn Kjos Pre-anesthesia Checklist: Patient identified, Patient being monitored, Timeout performed, Emergency Drugs available and Suction available Patient Re-evaluated:Patient Re-evaluated prior to inductionOxygen Delivery Method: Circle system utilized Preoxygenation: Pre-oxygenation with 100% oxygen Intubation Type: IV induction Ventilation: Mask ventilation without difficulty Laryngoscope Size: Miller and 2 Grade View: Grade I Tube type: Oral Tube size: 7.0 mm Number of attempts: 1 Airway Equipment and Method: Stylet Placement Confirmation: ETT inserted through vocal cords under direct vision,  positive ETCO2 and breath sounds checked- equal and bilateral Secured at: 21 cm Tube secured with: Tape Dental Injury: Teeth and Oropharynx as per pre-operative assessment        

## 2016-01-03 NOTE — Transfer of Care (Signed)
Immediate Anesthesia Transfer of Care Note  Patient: Debra Hogan  Procedure(s) Performed: Procedure(s): LAPAROSCOPIC CHOLECYSTECTOMY WITH INTRAOPERATIVE CHOLANGIOGRAM (N/A)  Patient Location: PACU  Anesthesia Type:General  Level of Consciousness: awake and alert   Airway & Oxygen Therapy: Patient Spontanous Breathing and Patient connected to face mask oxygen  Post-op Assessment: Report given to RN  Post vital signs: Reviewed  Last Vitals:  Filed Vitals:   01/03/16 1108 01/03/16 1259  BP: 151/83 136/73  Pulse: 77 89  Temp: 36.9 C 36.1 C  Resp: 16 14    Complications: No apparent anesthesia complications

## 2016-01-03 NOTE — Progress Notes (Signed)
Preoperative Review   Patient is met in the preoperative holding area. The history is reviewed in the chart and with the patient. I personally reviewed the options and rationale as well as the risks of this procedure that have been previously discussed with the patient. All questions asked by the patient and/or family were answered to their satisfaction. Patient's LFTs are almost normalized at this point. I discussed the rationale for surgery and the options of observation the potential for a retained stone and the potential for postoperative ERCP and the options of a preop ERCP. The patient and husband are in agreement with operative intervention today with cholangiography. The procedure was described as were the risks in detail Patient agrees to proceed with this procedure at this time.  Florene Glen M.D. FACS

## 2016-01-03 NOTE — Anesthesia Preprocedure Evaluation (Signed)
Anesthesia Evaluation  Patient identified by MRN, date of birth, ID band Patient awake    Reviewed: Allergy & Precautions, H&P , NPO status , Patient's Chart, lab work & pertinent test results, reviewed documented beta blocker date and time   Airway Mallampati: I  TM Distance: >3 FB Neck ROM: full    Dental no notable dental hx. (+) Teeth Intact   Pulmonary shortness of breath and with exertion, sleep apnea (hasn't use CPAP in awhile) , COPD,  COPD inhaler, neg recent URI, Current Smoker,    Pulmonary exam normal breath sounds clear to auscultation       Cardiovascular Exercise Tolerance: Good negative cardio ROS Normal cardiovascular exam(-) dysrhythmias  Rhythm:regular Rate:Normal     Neuro/Psych neg Seizures PSYCHIATRIC DISORDERS (Depression and anxiety) Multiple Sclerosis  Neuromuscular disease (Fibromylagia)    GI/Hepatic negative GI ROS, Neg liver ROS,   Endo/Other  neg diabetesHypothyroidism   Renal/GU negative Renal ROS  negative genitourinary   Musculoskeletal   Abdominal   Peds  Hematology negative hematology ROS (+)   Anesthesia Other Findings Past Medical History:   Fibromyalgia                                                 Irritable bowel syndrome                                     Depression                                                   Arthritis                                                    COPD (chronic obstructive pulmonary disease) (*              MS (multiple sclerosis) (HCC)                                  Comment:Followed by Dr. Sherryll Burger   Reproductive/Obstetrics negative OB ROS                             Anesthesia Physical Anesthesia Plan  ASA: III  Anesthesia Plan: General   Post-op Pain Management:    Induction:   Airway Management Planned:   Additional Equipment:   Intra-op Plan:   Post-operative Plan:   Informed Consent: I have  reviewed the patients History and Physical, chart, labs and discussed the procedure including the risks, benefits and alternatives for the proposed anesthesia with the patient or authorized representative who has indicated his/her understanding and acceptance.   Dental Advisory Given  Plan Discussed with: Anesthesiologist, CRNA and Surgeon  Anesthesia Plan Comments:         Anesthesia Quick Evaluation

## 2016-01-04 ENCOUNTER — Encounter: Payer: Self-pay | Admitting: Anesthesiology

## 2016-01-04 ENCOUNTER — Inpatient Hospital Stay: Payer: 59 | Admitting: Anesthesiology

## 2016-01-04 ENCOUNTER — Encounter
Admission: EM | Disposition: A | Discharge status: Home / Self Care | Payer: Self-pay | Source: Home / Self Care | Attending: Surgery

## 2016-01-04 DIAGNOSIS — K8050 Calculus of bile duct without cholangitis or cholecystitis without obstruction: Secondary | ICD-10-CM

## 2016-01-04 HISTORY — PX: ENDOSCOPIC RETROGRADE CHOLANGIOPANCREATOGRAPHY (ERCP) WITH PROPOFOL: SHX5810

## 2016-01-04 LAB — CBC WITH DIFFERENTIAL/PLATELET
BASOS ABS: 0 10*3/uL (ref 0–0.1)
Basophils Relative: 0 %
EOS PCT: 0 %
Eosinophils Absolute: 0 10*3/uL (ref 0–0.7)
HEMATOCRIT: 39.2 % (ref 35.0–47.0)
Hemoglobin: 13.2 g/dL (ref 12.0–16.0)
LYMPHS ABS: 0.4 10*3/uL — AB (ref 1.0–3.6)
LYMPHS PCT: 6 %
MCH: 31.2 pg (ref 26.0–34.0)
MCHC: 33.6 g/dL (ref 32.0–36.0)
MCV: 92.7 fL (ref 80.0–100.0)
MONO ABS: 0.4 10*3/uL (ref 0.2–0.9)
Monocytes Relative: 7 %
NEUTROS ABS: 5.5 10*3/uL (ref 1.4–6.5)
Neutrophils Relative %: 87 %
PLATELETS: 192 10*3/uL (ref 150–440)
RBC: 4.22 MIL/uL (ref 3.80–5.20)
RDW: 12.8 % (ref 11.5–14.5)
WBC: 6.3 10*3/uL (ref 3.6–11.0)

## 2016-01-04 LAB — COMPREHENSIVE METABOLIC PANEL
ALT: 284 U/L — AB (ref 14–54)
AST: 345 U/L — AB (ref 15–41)
Albumin: 4.1 g/dL (ref 3.5–5.0)
Alkaline Phosphatase: 272 U/L — ABNORMAL HIGH (ref 38–126)
Anion gap: 10 (ref 5–15)
BILIRUBIN TOTAL: 2.4 mg/dL — AB (ref 0.3–1.2)
BUN: 7 mg/dL (ref 6–20)
CHLORIDE: 102 mmol/L (ref 101–111)
CO2: 28 mmol/L (ref 22–32)
CREATININE: 0.58 mg/dL (ref 0.44–1.00)
Calcium: 9.3 mg/dL (ref 8.9–10.3)
Glucose, Bld: 136 mg/dL — ABNORMAL HIGH (ref 65–99)
Potassium: 4 mmol/L (ref 3.5–5.1)
Sodium: 140 mmol/L (ref 135–145)
TOTAL PROTEIN: 6.9 g/dL (ref 6.5–8.1)

## 2016-01-04 LAB — SURGICAL PATHOLOGY

## 2016-01-04 SURGERY — ENDOSCOPIC RETROGRADE CHOLANGIOPANCREATOGRAPHY (ERCP) WITH PROPOFOL
Anesthesia: General

## 2016-01-04 MED ORDER — OXYCODONE HCL 5 MG PO TABS
5.0000 mg | ORAL_TABLET | Freq: Once | ORAL | Status: DC | PRN
  Filled 2016-01-04: qty 1

## 2016-01-04 MED ORDER — NICOTINE 21 MG/24HR TD PT24: 21.0000 mg | MEDICATED_PATCH | Freq: Every day | TRANSDERMAL | Status: DC

## 2016-01-04 MED ORDER — FENTANYL CITRATE (PF) 100 MCG/2ML IJ SOLN
INTRAMUSCULAR | Status: DC | PRN
  Administered 2016-01-04: 100 ug via INTRAVENOUS

## 2016-01-04 MED ORDER — IPRATROPIUM-ALBUTEROL 0.5-2.5 (3) MG/3ML IN SOLN: 3.0000 mL | Freq: Once | RESPIRATORY_TRACT | Status: DC

## 2016-01-04 MED ORDER — GLYCOPYRROLATE 0.2 MG/ML IJ SOLN
INTRAMUSCULAR | Status: DC | PRN
  Administered 2016-01-04: 0.2 mg via INTRAVENOUS

## 2016-01-04 MED ORDER — ONDANSETRON HCL 4 MG/2ML IJ SOLN
4.0000 mg | Freq: Once | INTRAMUSCULAR | Status: AC
  Administered 2016-01-04: 4 mg via INTRAVENOUS

## 2016-01-04 MED ORDER — LIDOCAINE HCL (CARDIAC) 20 MG/ML IV SOLN
INTRAVENOUS | Status: DC | PRN
  Administered 2016-01-04: 60 mg via INTRAVENOUS

## 2016-01-04 MED ORDER — SODIUM CHLORIDE 0.9 % IV SOLN
INTRAVENOUS | Status: DC
  Administered 2016-01-04: 12:00:00 via INTRAVENOUS

## 2016-01-04 MED ORDER — ROCURONIUM BROMIDE 100 MG/10ML IV SOLN
INTRAVENOUS | Status: DC | PRN
  Administered 2016-01-04: 5 mg via INTRAVENOUS

## 2016-01-04 MED ORDER — FENTANYL CITRATE (PF) 100 MCG/2ML IJ SOLN
25.0000 ug | INTRAMUSCULAR | Status: DC | PRN
  Administered 2016-01-04 (×3): 25 ug via INTRAVENOUS

## 2016-01-04 MED ORDER — MIDAZOLAM HCL 2 MG/2ML IJ SOLN
INTRAMUSCULAR | Status: DC | PRN
  Administered 2016-01-04: 2 mg via INTRAVENOUS

## 2016-01-04 MED ORDER — INDOMETHACIN 50 MG RE SUPP
100.0000 mg | Freq: Once | RECTAL | Status: DC
Start: 2016-01-04 — End: 2016-01-05
  Filled 2016-01-04: qty 2

## 2016-01-04 MED ORDER — OXYCODONE HCL 5 MG/5ML PO SOLN
5.0000 mg | Freq: Once | ORAL | Status: DC | PRN
  Filled 2016-01-04: qty 5

## 2016-01-04 MED ORDER — OXYCODONE HCL 5 MG PO TABS: 5.0000 mg | ORAL_TABLET | ORAL | Status: DC | PRN

## 2016-01-04 MED ORDER — AMOXICILLIN-POT CLAVULANATE 875-125 MG PO TABS
1.0000 | ORAL_TABLET | Freq: Two times a day (BID) | ORAL | Status: DC
Start: 2016-01-04 — End: 2017-02-21

## 2016-01-04 MED ORDER — PROPOFOL 10 MG/ML IV BOLUS
INTRAVENOUS | Status: DC | PRN
  Administered 2016-01-04: 20 mg via INTRAVENOUS
  Administered 2016-01-04: 150 mg via INTRAVENOUS

## 2016-01-04 MED ORDER — SUCCINYLCHOLINE CHLORIDE 20 MG/ML IJ SOLN
INTRAMUSCULAR | Status: DC | PRN
  Administered 2016-01-04: 20 mg via INTRAVENOUS
  Administered 2016-01-04: 100 mg via INTRAVENOUS

## 2016-01-04 NOTE — Anesthesia Postprocedure Evaluation (Signed)
Anesthesia Post Note  Patient: Debra Hogan  Procedure(s) Performed: Procedure(s) (LRB): ENDOSCOPIC RETROGRADE CHOLANGIOPANCREATOGRAPHY (ERCP) WITH PROPOFOL (N/A)  Patient location during evaluation: PACU Anesthesia Type: General Level of consciousness: awake and alert Pain management: pain level controlled Vital Signs Assessment: post-procedure vital signs reviewed and stable Respiratory status: spontaneous breathing, nonlabored ventilation, respiratory function stable and patient connected to nasal cannula oxygen Cardiovascular status: blood pressure returned to baseline and stable Postop Assessment: no signs of nausea or vomiting Anesthetic complications: no    Last Vitals:  Filed Vitals:   01/04/16 1330 01/04/16 1334  BP:  164/87  Pulse: 73 92  Temp: 36.7 C   Resp: 12 21    Last Pain:  Filed Vitals:   01/04/16 1334  PainSc: 3                  Jomarie Longs K Indira Sorenson

## 2016-01-04 NOTE — Progress Notes (Signed)
zofran given for nausea  

## 2016-01-04 NOTE — Transfer of Care (Signed)
Immediate Anesthesia Transfer of Care Note  Patient: Debra Hogan  Procedure(s) Performed: Procedure(s): ENDOSCOPIC RETROGRADE CHOLANGIOPANCREATOGRAPHY (ERCP) WITH PROPOFOL (N/A)  Patient Location: PACU  Anesthesia Type:General  Level of Consciousness: sedated  Airway & Oxygen Therapy: Patient connected to face mask oxygen  Post-op Assessment: Report given to RN  Post vital signs: Reviewed and stable  Last Vitals:  Filed Vitals:   01/04/16 1128 01/04/16 1239  BP: 145/72 143/93  Pulse: 70 110  Temp: 37.2 C   Resp: 18 20    Complications: No apparent anesthesia complications

## 2016-01-04 NOTE — Anesthesia Preprocedure Evaluation (Addendum)
Anesthesia Evaluation  Patient identified by MRN, date of birth, ID band Patient awake    Reviewed: Allergy & Precautions, H&P , NPO status , Patient's Chart, lab work & pertinent test results, reviewed documented beta blocker date and time   History of Anesthesia Complications Negative for: history of anesthetic complications  Airway Mallampati: III  TM Distance: >3 FB Neck ROM: full    Dental no notable dental hx. (+) Teeth Intact   Pulmonary shortness of breath and with exertion, sleep apnea (hasn't use CPAP in awhile) , COPD,  COPD inhaler, neg recent URI, Current Smoker,    Pulmonary exam normal breath sounds clear to auscultation       Cardiovascular Exercise Tolerance: Good (-) angina(-) Past MI and (-) DOE negative cardio ROS Normal cardiovascular exam(-) dysrhythmias  Rhythm:regular Rate:Normal     Neuro/Psych neg Seizures PSYCHIATRIC DISORDERS (Depression and anxiety) Depression Multiple Sclerosis  Neuromuscular disease (Fibromylagia)    GI/Hepatic Neg liver ROS, GERD  Controlled,  Endo/Other  neg diabetesHypothyroidism   Renal/GU negative Renal ROS  negative genitourinary   Musculoskeletal  (+) Arthritis , Fibromyalgia -  Abdominal   Peds  Hematology negative hematology ROS (+)   Anesthesia Other Findings Past Medical History:   Fibromyalgia                                                 Irritable bowel syndrome                                     Depression                                                   Arthritis                                                    COPD (chronic obstructive pulmonary disease) (*              MS (multiple sclerosis) (HCC)                                  Comment:Followed by Dr. Sherryll Burger   Reproductive/Obstetrics negative OB ROS                            Anesthesia Physical  Anesthesia Plan  ASA: III  Anesthesia Plan: General ETT    Post-op Pain Management:    Induction:   Airway Management Planned:   Additional Equipment:   Intra-op Plan:   Post-operative Plan:   Informed Consent: I have reviewed the patients History and Physical, chart, labs and discussed the procedure including the risks, benefits and alternatives for the proposed anesthesia with the patient or authorized representative who has indicated his/her understanding and acceptance.   Dental Advisory Given  Plan Discussed with: Anesthesiologist, CRNA and Surgeon  Anesthesia Plan Comments: (Surgeon requesting intubation for this  procedure.)       Anesthesia Quick Evaluation

## 2016-01-04 NOTE — OR Nursing (Signed)
Patient being intubated and will going to PACU for recovery.

## 2016-01-04 NOTE — Anesthesia Procedure Notes (Signed)
Procedure Name: Intubation Date/Time: 01/04/2016 11:51 AM Performed by: Edmund Hilda Pre-anesthesia Checklist: Patient identified, Emergency Drugs available, Suction available, Patient being monitored and Timeout performed Patient Re-evaluated:Patient Re-evaluated prior to inductionOxygen Delivery Method: Circle system utilized Preoxygenation: Pre-oxygenation with 100% oxygen Intubation Type: IV induction Ventilation: Mask ventilation without difficulty Laryngoscope Size: Mac and 3 Grade View: Grade I Tube type: Oral Tube size: 7.5 mm Number of attempts: 1 Airway Equipment and Method: Stylet Placement Confirmation: positive ETCO2 and ETT inserted through vocal cords under direct vision Secured at: 23 cm Tube secured with: Tape Dental Injury: Teeth and Oropharynx as per pre-operative assessment

## 2016-01-04 NOTE — Progress Notes (Signed)
1 Day Post-Op  Subjective: Patient is hungry and wants to eat she is scheduled for an ERCP today. rettained stone is present.  Objective: Vital signs in last 24 hours: Temp:  [96.9 F (36.1 C)-98.9 F (37.2 C)] 98.4 F (36.9 C) (01/10 0618) Pulse Rate:  [74-98] 81 (01/10 0618) Resp:  [14-21] 20 (01/09 2040) BP: (131-171)/(69-82) 135/69 mmHg (01/10 0618) SpO2:  [92 %-100 %] 92 % (01/10 0618) Last BM Date: 12/31/15  Intake/Output from previous day: 01/09 0701 - 01/10 0700 In: 4183.3 [I.V.:4168.3] Out: 2005 [Urine:1950; Drains:55] Intake/Output this shift:    Physical exam:  Vital signs are stable No scleral icterus no jaundice abdomen is soft nontender wounds are dressed and no bile in drain Lab Results: CBC   Recent Labs  01/02/16 0543 01/04/16 0426  WBC 3.5* 6.3  HGB 12.5 13.2  HCT 36.6 39.2  PLT 156 192   BMET  Recent Labs  01/02/16 0543 01/04/16 0426  NA 144 140  K 3.1* 4.0  CL 107 102  CO2 30 28  GLUCOSE 92 136*  BUN 8 7  CREATININE 0.60 0.58  CALCIUM 8.9 9.3   PT/INR No results for input(s): LABPROT, INR in the last 72 hours. ABG No results for input(s): PHART, HCO3 in the last 72 hours.  Invalid input(s): PCO2, PO2  Studies/Results: Dg Cholangiogram Operative  01/04/2016  CLINICAL DATA:  55 year old female with cholelithiasis and choledocholithiasis EXAM: INTRAOPERATIVE CHOLANGIOGRAM TECHNIQUE: Cholangiographic images from the C-arm fluoroscopic device were submitted for interpretation post-operatively. Please see the procedural report for the amount of contrast and the fluoroscopy time utilized. COMPARISON:  MRCP 01/01/2016 FINDINGS: A cine loop obtained during intraoperative cholangiogram at the time of laparoscopic cholecystectomy demonstrates cannulation of the cystic duct remanent and opacification of the biliary tree. There is moderate intra and extrahepatic biliary ductal dilatation secondary to a rounded filling defect in the distal common  duct. Findings are consistent with obstructing choledocholithiasis. IMPRESSION: 1. Choledocholithiasis with a stone in the distal common bile duct. 2. Moderate intra and extrahepatic biliary ductal dilatation. Electronically Signed   By: Malachy Moan M.D.   On: 01/04/2016 07:37    Anti-infectives: Anti-infectives    Start     Dose/Rate Route Frequency Ordered Stop   01/03/16 1445  acyclovir (ZOVIRAX) tablet 400 mg  Status:  Discontinued     400 mg Oral Daily 01/03/16 1433 01/03/16 1434   01/01/16 1000  acyclovir (ZOVIRAX) 200 MG capsule 400 mg     400 mg Oral Daily 01/01/16 0339     12/31/15 2015  acyclovir (ZOVIRAX) tablet 400 mg  Status:  Discontinued     400 mg Oral Daily 12/31/15 2002 01/01/16 0339   12/31/15 2015  Ampicillin-Sulbactam (UNASYN) 3 g in sodium chloride 0.9 % 100 mL IVPB     3 g 100 mL/hr over 60 Minutes Intravenous Every 6 hours 12/31/15 2009        Assessment/Plan: s/p Procedure(s): LAPAROSCOPIC CHOLECYSTECTOMY WITH INTRAOPERATIVE CHOLANGIOGRAM   LFTs are noted. ERCP today with Dr. Servando Snare Probably home tomorrow  Lattie Haw, MD, FACS  01/04/2016

## 2016-01-04 NOTE — Discharge Instructions (Signed)
Remove dressing in 24 hours. °Gad shower in 24 hours. °Leave paper strips in place. °Resume all home medications. °Follow-up with Dr. Allahna Husband in 10 days. °

## 2016-01-04 NOTE — Progress Notes (Signed)
Nausea better.

## 2016-01-04 NOTE — Progress Notes (Signed)
Notified Dr. Excell Seltzer about pt.'s BP and the trends of her BP the past couple of days. No new orders given to treat high blood pressure at this time. Will continue to monitor pt.  Debra Hogan

## 2016-01-04 NOTE — Op Note (Signed)
[facility] Gastroenterology Patient Name: Debra Hogan Procedure Date: 01/04/2016 11:43 AM MRN: [id number] Account #: [id number] Date of Birth: 03-Jan-1961 Admit Type: Inpatient Age: 55 Room: [facility] ENDO ROOM 4 Gender: Female Note Status: Finalized Procedure:         ERCP Indications:       Suspected bile duct stone(s) Providers:         Midge Minium, MD Referring MD:      Ginette Pitman. Dan Humphreys, MD (Referring MD) Medicines:         Propofol per Anesthesia Complications:     No immediate complications. Procedure:         Pre-Anesthesia Assessment:                    - Prior to the procedure, a History and Physical was                     performed, and patient medications and allergies were                     reviewed. The patient's tolerance of previous anesthesia                     was also reviewed. The risks and benefits of the procedure                     and the sedation options and risks were discussed with the                     patient. All questions were answered, and informed consent                     was obtained. Prior Anticoagulants: The patient has taken                     no previous anticoagulant or antiplatelet agents. ASA                     Grade Assessment: II - A patient with mild systemic                     disease. After reviewing the risks and benefits, the                     patient was deemed in satisfactory condition to undergo                     the procedure.                    After obtaining informed consent, the scope was passed                     under direct vision. Throughout the procedure, the                     patient's blood pressure, pulse, and oxygen saturations                     were monitored continuously. The ERCP was introduced                     through the mouth, and used to inject contrast into and  used to inject contrast into the bile duct. The ERCP was                     accomplished  without difficulty. The patient tolerated the                     procedure well. Findings:      A scout film of the abdomen was obtained. Surgical clips were seen in       the area of the cystic duct. One percutaneous drain ending in the Right       upper quadrant was seen. The major papilla was normal. The bile duct was       deeply cannulated with the short-nosed traction sphincterotome. Contrast       was injected. I personally interpreted the bile duct images. There was       brisk flow of contrast through the ducts. Image quality was excellent.       Contrast extended to the entire biliary tree. The lower third of the       main bile duct contained one stone, which was 5 mm in diameter. A wire       was passed into the biliary tree. An 8 mm biliary sphincterotomy was       made with a sphincterotome using ERBE electrocautery. There was no       post-sphincterotomy bleeding. The biliary tree was swept with an 15 mm       balloon starting at the bifurcation. One stone was removed. No stones       remained. Impression:        - The major papilla appeared normal.                    - A sphincterotomy was performed.                    - The biliary tree was swept.                    - Choledocholithiasis was found. Complete removal was                     accomplished by biliary sphincterotomy. Recommendation:    - Watch for pancreatitis, bleeding, perforation, and                     cholangitis. Procedure Code(s): --- Professional ---                    [id number], Endoscopic retrograde cholangiopancreatography                     (ERCP); with removal of calculi/debris from                     biliary/pancreatic duct(s)                    43262, Endoscopic retrograde cholangiopancreatography                     (ERCP); with sphincterotomy/papillotomy                    [phone], Endoscopic catheterization of the biliary ductal                     system, radiological supervision and  interpretation Diagnosis Code(s): ---  Professional ---                    K80.50, Calculus of bile duct without cholangitis or                     cholecystitis without obstruction CPT copyright 2014 American Medical Association. All rights reserved. The codes documented in this report are preliminary and upon coder review Hashemi  be revised to meet current compliance requirements. Midge Minium, MD 01/04/2016 12:22:57 PM This report has been signed electronically. Number of Addenda: 0 Note Initiated On: 01/04/2016 11:43 AM      [facility]

## 2016-01-04 NOTE — Anesthesia Postprocedure Evaluation (Signed)
Anesthesia Post Note  Patient: Debra Hogan  Procedure(s) Performed: Procedure(s) (LRB): LAPAROSCOPIC CHOLECYSTECTOMY WITH INTRAOPERATIVE CHOLANGIOGRAM (N/A)  Patient location during evaluation: PACU Anesthesia Type: General Level of consciousness: awake and alert Pain management: pain level controlled Vital Signs Assessment: post-procedure vital signs reviewed and stable Respiratory status: spontaneous breathing, nonlabored ventilation, respiratory function stable and patient connected to nasal cannula oxygen Cardiovascular status: blood pressure returned to baseline and stable Postop Assessment: no signs of nausea or vomiting Anesthetic complications: no    Last Vitals:  Filed Vitals:   01/04/16 1334 01/04/16 1423  BP: 164/87 165/80  Pulse: 92 80  Temp:  36.8 C  Resp: 21     Last Pain:  Filed Vitals:   01/04/16 1425  PainSc: 3                  Lenard Simmer

## 2016-01-04 NOTE — Progress Notes (Signed)
Pt.'s lungs are clear to auscultation. She states she is not short of breath and therefore refused the nebulizer treatment.

## 2016-01-05 ENCOUNTER — Telehealth: Payer: Self-pay

## 2016-01-05 NOTE — Telephone Encounter (Signed)
Called to follow up with transitional care and patient states she is going to follow up with surgeon only at this time.  States she is doing ok after having her gall bladder removed and just wants to stay home.  She will call back to schedule for a follow up at a later date. Encouraged her to call PCP or return to the ED if condition worsens.

## 2016-01-05 NOTE — Discharge Summary (Signed)
Physician Discharge Summary  Patient ID: Debra Hogan MRN: [id number] DOB/AGE: March 28, 1961 55 y.o.  Admit date: 12/31/2015 Discharge date: 01/05/2016   Discharge Diagnoses:  Active Problems:   Choledocholithiasis with acute cholecystitis with obstruction   Hyperbilirubinemia   Gallstones, common bile duct   Procedures: Laparoscopic cholecystectomy with cholangiography, ERCP  Hospital Course: This patient admitted the hospital with signs of choledocholithiasis. She was treated conservatively and an MR showed a common bile duct stone with elevated liver function tests. She was taken the operating room where laparoscopic cholecystectomy and cholangiography was performed and this confirmed the presence of a retained stone. Dr. Servando Snare who had seen the patient preoperatively was then re-consult it and he performed an ERCP and stone extraction. Patient made an uncomplicated postoperative recovery there is no bile in her drain to suggest bile leak therefore the drain was removed. She is discharged in stable condition to follow-up in my office in 10 days.  Consults: GI Dr. Servando Snare  Disposition: Final discharge disposition not confirmed  Discharge Instructions    Suggamadex Discharge Instructions    Complete by:  As directed   During your recent anesthetic, you were given the medication sugammadex (Bridion). This medication interacts with hormonal forms of birth control (oral contraceptives and injected or implanted birth control) and Buday make them ineffective. IF YOU USE ANY HORMONAL FORM OF BIRTH CONTROL, YOU MUST USE AN ADDITIONAL BARRIER BIRTH CONTROL METHOD FOR SEVEN DAYS after receiving sugammadex (Bridion) or there is a chance you could become pregnant.            Medication List    TAKE these medications        acyclovir 400 MG tablet  Commonly known as:  ZOVIRAX  Take 400 mg by mouth daily.     ADVAIR DISKUS 100-50 MCG/DOSE Aepb  Generic drug:  Fluticasone-Salmeterol  Inhale 1 puff  into the lungs 2 (two) times daily as needed (for shortness of breath).     albuterol 108 (90 Base) MCG/ACT inhaler  Commonly known as:  PROVENTIL HFA;VENTOLIN HFA  Inhale 2 puffs into the lungs every 6 (six) hours as needed for wheezing or shortness of breath.     ALPRAZolam 0.25 MG tablet  Commonly known as:  XANAX  Take 1 tablet (0.25 mg total) by mouth 3 (three) times daily as needed for sleep or anxiety.     amoxicillin-clavulanate 875-125 MG tablet  Commonly known as:  AUGMENTIN  Take 1 tablet by mouth 2 (two) times daily.     nicotine 21 mg/24hr patch  Commonly known as:  NICODERM CQ - dosed in mg/24 hours  Place 1 patch (21 mg total) onto the skin daily.     oxyCODONE 5 MG immediate release tablet  Commonly known as:  Oxy IR/ROXICODONE  Take 1 tablet (5 mg total) by mouth every 4 (four) hours as needed for moderate pain.     pantoprazole 40 MG tablet  Commonly known as:  PROTONIX  Take 1 tablet (40 mg total) by mouth daily.     TECFIDERA 240 MG Cpdr  Generic drug:  Dimethyl Fumarate  Take 240 mg by mouth 2 (two) times daily.     VOLTAREN 1 % Gel  Generic drug:  diclofenac sodium  Apply 2 g topically 4 (four) times daily as needed (for pain).           Follow-up Information    Follow up with Dionne Milo, MD In 10 days.   Specialty:  Surgery  Why:  For wound re-check   Contact information:   [address] [place] [address] [phone]       Lattie Haw, MD, FACS

## 2016-01-05 NOTE — Progress Notes (Signed)
Patient discharge teaching given, including activity, diet, follow-up appoints, and medications. Patient verbalized understanding of all discharge instructions. IV access was d/c'd. Vitals are stable. Skin is intact except as charted in most recent assessments. Pt to be escorted out by volunteer and significant other, to be driven home by significant other.  Debra Hogan

## 2016-01-05 NOTE — Progress Notes (Signed)
1 Day Post-Op  Subjective: Patient feels well following a laparoscopic cholecystectomy with cholangiography followed by an ERCP for retained stone extraction. She has no bile in her drain and is tolerating a diet and wants to go home. She has no nausea vomiting fevers or chills  Objective: Vital signs in last 24 hours: Temp:  [97.2 F (36.2 C)-99.5 F (37.5 C)] 98.9 F (37.2 C) (01/11 0523) Pulse Rate:  [70-122] 70 (01/11 0523) Resp:  [12-35] 18 (01/10 2113) BP: (121-165)/(68-98) 135/69 mmHg (01/11 0523) SpO2:  [93 %-100 %] 93 % (01/11 0523) Weight:  [168 lb (76.204 kg)] 168 lb (76.204 kg) (01/10 1128) Last BM Date: 12/31/15  Intake/Output from previous day: 01/10 0701 - 01/11 0700 In: 3730.6 [P.O.:600; I.V.:2930.6; IV Piggyback:200] Out: 1665 [Urine:1625; Drains:40] Intake/Output this shift: Total I/O In: 1149 [P.O.:700; I.V.:449] Out: -   Physical exam:  No scleral icterus no jaundice abdomen is soft nontender no bile in drain nontender calves. Drain is removed.  Lab Results: CBC   Recent Labs  01/04/16 0426  WBC 6.3  HGB 13.2  HCT 39.2  PLT 192   BMET  Recent Labs  01/04/16 0426  NA 140  K 4.0  CL 102  CO2 28  GLUCOSE 136*  BUN 7  CREATININE 0.58  CALCIUM 9.3   PT/INR No results for input(s): LABPROT, INR in the last 72 hours. ABG No results for input(s): PHART, HCO3 in the last 72 hours.  Invalid input(s): PCO2, PO2  Studies/Results: Dg Cholangiogram Operative  01/04/2016  CLINICAL DATA:  55 year old female with cholelithiasis and choledocholithiasis EXAM: INTRAOPERATIVE CHOLANGIOGRAM TECHNIQUE: Cholangiographic images from the C-arm fluoroscopic device were submitted for interpretation post-operatively. Please see the procedural report for the amount of contrast and the fluoroscopy time utilized. COMPARISON:  MRCP 01/01/2016 FINDINGS: A cine loop obtained during intraoperative cholangiogram at the time of laparoscopic cholecystectomy demonstrates  cannulation of the cystic duct remanent and opacification of the biliary tree. There is moderate intra and extrahepatic biliary ductal dilatation secondary to a rounded filling defect in the distal common duct. Findings are consistent with obstructing choledocholithiasis. IMPRESSION: 1. Choledocholithiasis with a stone in the distal common bile duct. 2. Moderate intra and extrahepatic biliary ductal dilatation. Electronically Signed   By: Malachy Moan M.D.   On: 01/04/2016 07:37    Anti-infectives: Anti-infectives    Start     Dose/Rate Route Frequency Ordered Stop   01/04/16 0000  amoxicillin-clavulanate (AUGMENTIN) 875-125 MG tablet     1 tablet Oral 2 times daily 01/04/16 1600     01/03/16 1445  acyclovir (ZOVIRAX) tablet 400 mg  Status:  Discontinued     400 mg Oral Daily 01/03/16 1433 01/03/16 1434   01/01/16 1000  acyclovir (ZOVIRAX) 200 MG capsule 400 mg     400 mg Oral Daily 01/01/16 0339     12/31/15 2015  acyclovir (ZOVIRAX) tablet 400 mg  Status:  Discontinued     400 mg Oral Daily 12/31/15 2002 01/01/16 0339   12/31/15 2015  Ampicillin-Sulbactam (UNASYN) 3 g in sodium chloride 0.9 % 100 mL IVPB     3 g 100 mL/hr over 60 Minutes Intravenous Every 6 hours 12/31/15 2009        Assessment/Plan: s/p Procedure(s): ENDOSCOPIC RETROGRADE CHOLANGIOPANCREATOGRAPHY (ERCP) WITH PROPOFOL   Patient doing very well recommend discharge today with plans to follow-up next week discharge medications are written.  Lattie Haw, MD, FACS  01/05/2016

## 2016-01-06 ENCOUNTER — Telehealth: Payer: Self-pay

## 2016-01-06 ENCOUNTER — Encounter: Payer: Self-pay | Admitting: Gastroenterology

## 2016-01-10 NOTE — Telephone Encounter (Signed)
Post discharge call to patient made at this time. Pain is controlled at this time. No questions or concerns.   Confirmed patient appointment scheduled. Encouraged patient to call with any questions that arise prior to appointment. 

## 2016-01-14 ENCOUNTER — Ambulatory Visit (INDEPENDENT_AMBULATORY_CARE_PROVIDER_SITE_OTHER): Payer: Self-pay | Admitting: Surgery

## 2016-01-14 ENCOUNTER — Encounter: Payer: Self-pay | Admitting: Surgery

## 2016-01-14 ENCOUNTER — Other Ambulatory Visit
Admission: RE | Admit: 2016-01-14 | Discharge: 2016-01-14 | Disposition: A | Discharge status: Home / Self Care | Payer: 59 | Source: Ambulatory Visit | Attending: Surgery | Admitting: Surgery

## 2016-01-14 ENCOUNTER — Ambulatory Visit: Payer: 59 | Admitting: Surgery

## 2016-01-14 VITALS — BP 124/84 | HR 87 | Temp 98.4°F

## 2016-01-14 DIAGNOSIS — K8000 Calculus of gallbladder with acute cholecystitis without obstruction: Secondary | ICD-10-CM

## 2016-01-14 LAB — COMPREHENSIVE METABOLIC PANEL
ALBUMIN: 4.1 g/dL (ref 3.5–5.0)
ALK PHOS: 116 U/L (ref 38–126)
ALT: 23 U/L (ref 14–54)
ANION GAP: 9 (ref 5–15)
AST: 20 U/L (ref 15–41)
BUN: 14 mg/dL (ref 6–20)
CHLORIDE: 104 mmol/L (ref 101–111)
CO2: 27 mmol/L (ref 22–32)
Calcium: 9.6 mg/dL (ref 8.9–10.3)
Creatinine, Ser: 0.64 mg/dL (ref 0.44–1.00)
GFR calc non Af Amer: >60 mL/min (ref >60)
GLUCOSE: 83 mg/dL (ref 65–99)
POTASSIUM: 3.7 mmol/L (ref 3.5–5.1)
SODIUM: 140 mmol/L (ref 135–145)
Total Bilirubin: 0.6 mg/dL (ref 0.3–1.2)
Total Protein: 7.4 g/dL (ref 6.5–8.1)

## 2016-01-14 LAB — CBC WITH DIFFERENTIAL/PLATELET
BASOS PCT: 0 %
Basophils Absolute: 0 10*3/uL (ref 0–0.1)
EOS ABS: 0.2 10*3/uL (ref 0–0.7)
EOS PCT: 4 %
HCT: 43.6 % (ref 35.0–47.0)
HEMOGLOBIN: 14.9 g/dL (ref 12.0–16.0)
Lymphocytes Relative: 13 %
Lymphs Abs: 0.7 10*3/uL — ABNORMAL LOW (ref 1.0–3.6)
MCH: 31.2 pg (ref 26.0–34.0)
MCHC: 34.2 g/dL (ref 32.0–36.0)
MCV: 91.2 fL (ref 80.0–100.0)
MONOS PCT: 12 %
Monocytes Absolute: 0.7 10*3/uL (ref 0.2–0.9)
NEUTROS PCT: 71 %
Neutro Abs: 3.9 10*3/uL (ref 1.4–6.5)
PLATELETS: 231 10*3/uL (ref 150–440)
RBC: 4.78 MIL/uL (ref 3.80–5.20)
RDW: 12.9 % (ref 11.5–14.5)
WBC: 5.5 10*3/uL (ref 3.6–11.0)

## 2016-01-14 NOTE — Progress Notes (Signed)
Outpatient postop visit  01/14/2016  Debra Hogan is an 55 y.o. female.    Procedure: laparo scopic cholecystectomy with cholangiography and postop ERCP with stone extraction  CC: Minimally pain  HPI: This patient had a laparoscopic procedure with postop ERCP for stone extraction currently she complains of occasional right upper quadrant pain and bloating often postprandial and some loose stools. Stools are not described as loose but she has an urgency right after eating which is typical. A nice fevers or chills.  Medications reviewed.    Physical Exam:  BP 124/84 mmHg  Pulse 87  Temp(Src) 98.4 F (36.9 C) (Oral)    PE: No scleral icterus soft nontender abdomen wound healing well no erythema no drainage    Assessment/Plan:  Pathology is reviewed. With patient's history of an ERCP and a cholangiography I doubt that there is another stone present but because she has ongoing postprandial pain I will check LFTs today and call her if there is anything abnormal otherwise she can follow up on an as-needed basis.  Lattie Haw, MD, FACS

## 2016-01-14 NOTE — Patient Instructions (Addendum)
Please give [place] a call if you have any questions or concerns.  Remember that the pain that you are having on your Right Upper Quadrant will eventually go away in about two more weeks.  You are able to go back and eat your [place].

## 2016-01-22 ENCOUNTER — Other Ambulatory Visit: Payer: Self-pay | Admitting: Internal Medicine

## 2016-01-24 ENCOUNTER — Telehealth: Payer: Self-pay | Admitting: Internal Medicine

## 2016-01-24 ENCOUNTER — Other Ambulatory Visit: Payer: Self-pay

## 2016-01-24 MED ORDER — ACYCLOVIR 400 MG PO TABS: 400.0000 mg | ORAL_TABLET | Freq: Every day | ORAL | Status: DC

## 2016-01-24 NOTE — Telephone Encounter (Signed)
Zovirax filled

## 2016-01-24 NOTE — Telephone Encounter (Signed)
Pt called about needing a refill for acyclovir (ZOVIRAX) 400 MG tablet. Pharmacy is [facility] [address] [place], [place] - [address]. Pt states she does not have any insurance. Pt states pharmacy states she does not have any refills pt states she needs that prescription. Call pt @ [phone]. Thank you!

## 2017-02-21 ENCOUNTER — Encounter: Payer: Self-pay | Admitting: Family

## 2017-02-21 ENCOUNTER — Other Ambulatory Visit: Payer: Self-pay | Admitting: Family

## 2017-02-21 ENCOUNTER — Ambulatory Visit (INDEPENDENT_AMBULATORY_CARE_PROVIDER_SITE_OTHER): Payer: BLUE CROSS/BLUE SHIELD | Admitting: Family

## 2017-02-21 VITALS — BP 138/102 | HR 100 | Temp 98.3°F | Ht 65.0 in | Wt 163.0 lb

## 2017-02-21 DIAGNOSIS — E039 Hypothyroidism, unspecified: Secondary | ICD-10-CM

## 2017-02-21 DIAGNOSIS — J449 Chronic obstructive pulmonary disease, unspecified: Secondary | ICD-10-CM | POA: Diagnosis not present

## 2017-02-21 DIAGNOSIS — G35 Multiple sclerosis: Secondary | ICD-10-CM

## 2017-02-21 DIAGNOSIS — A6004 Herpesviral vulvovaginitis: Secondary | ICD-10-CM

## 2017-02-21 DIAGNOSIS — F411 Generalized anxiety disorder: Secondary | ICD-10-CM

## 2017-02-21 LAB — CBC WITH DIFFERENTIAL/PLATELET
BASOS PCT: 0.7 % (ref 0.0–3.0)
Basophils Absolute: 0.1 10*3/uL (ref 0.0–0.1)
EOS ABS: 0.1 10*3/uL (ref 0.0–0.7)
Eosinophils Relative: 1.6 % (ref 0.0–5.0)
HCT: 46.7 % — ABNORMAL HIGH (ref 36.0–46.0)
HEMOGLOBIN: 15.9 g/dL — AB (ref 12.0–15.0)
Lymphocytes Relative: 15.8 % (ref 12.0–46.0)
Lymphs Abs: 1.2 10*3/uL (ref 0.7–4.0)
MCHC: 34.1 g/dL (ref 30.0–36.0)
MCV: 92.7 fl (ref 78.0–100.0)
MONO ABS: 0.6 10*3/uL (ref 0.1–1.0)
Monocytes Relative: 8.5 % (ref 3.0–12.0)
NEUTROS PCT: 73.4 % (ref 43.0–77.0)
Neutro Abs: 5.5 10*3/uL (ref 1.4–7.7)
PLATELETS: 250 10*3/uL (ref 150.0–400.0)
RBC: 5.04 Mil/uL (ref 3.87–5.11)
RDW: 12.9 % (ref 11.5–15.5)
WBC: 7.5 10*3/uL (ref 4.0–10.5)

## 2017-02-21 LAB — LIPID PANEL
CHOL/HDL RATIO: 7
Cholesterol: 293 mg/dL — ABNORMAL HIGH (ref 0–200)
HDL: 42.7 mg/dL (ref >39.00)
Triglycerides: 581 mg/dL — ABNORMAL HIGH (ref 0.0–149.0)

## 2017-02-21 LAB — COMPREHENSIVE METABOLIC PANEL
ALBUMIN: 4.3 g/dL (ref 3.5–5.2)
ALT: 23 U/L (ref 0–35)
AST: 29 U/L (ref 0–37)
Alkaline Phosphatase: 84 U/L (ref 39–117)
BUN: 9 mg/dL (ref 6–23)
CHLORIDE: 105 meq/L (ref 96–112)
CO2: 32 meq/L (ref 19–32)
CREATININE: 0.79 mg/dL (ref 0.40–1.20)
Calcium: 9.6 mg/dL (ref 8.4–10.5)
GFR: 79.6 mL/min (ref >60.00)
GLUCOSE: 87 mg/dL (ref 70–99)
Potassium: 4.4 mEq/L (ref 3.5–5.1)
SODIUM: 140 meq/L (ref 135–145)
Total Bilirubin: 0.4 mg/dL (ref 0.2–1.2)
Total Protein: 7.3 g/dL (ref 6.0–8.3)

## 2017-02-21 LAB — TSH: TSH: 4.2 u[IU]/mL (ref 0.35–4.50)

## 2017-02-21 LAB — LDL CHOLESTEROL, DIRECT: LDL DIRECT: 134 mg/dL

## 2017-02-21 LAB — VITAMIN D 25 HYDROXY (VIT D DEFICIENCY, FRACTURES): VITD: 24.78 ng/mL — ABNORMAL LOW (ref 30.00–100.00)

## 2017-02-21 MED ORDER — ACYCLOVIR 400 MG PO TABS: 400.0000 mg | ORAL_TABLET | Freq: Every day | ORAL | 3 refills | Status: DC

## 2017-02-21 MED ORDER — FLUTICASONE-SALMETEROL 100-50 MCG/DOSE IN AEPB: 1.0000 | INHALATION_SPRAY | Freq: Two times a day (BID) | RESPIRATORY_TRACT | 3 refills | Status: DC | PRN

## 2017-02-21 MED ORDER — ALPRAZOLAM 0.25 MG PO TABS
0.2500 mg | ORAL_TABLET | Freq: Every day | ORAL | 0 refills | Status: DC | PRN
Start: 2017-02-21 — End: 2018-03-29

## 2017-02-21 MED ORDER — ALBUTEROL SULFATE HFA 108 (90 BASE) MCG/ACT IN AERS: 2.0000 | INHALATION_SPRAY | Freq: Four times a day (QID) | RESPIRATORY_TRACT | 1 refills | Status: DC | PRN

## 2017-02-21 MED ORDER — SERTRALINE HCL 50 MG PO TABS: 50.0000 mg | ORAL_TABLET | Freq: Every day | ORAL | 1 refills | Status: DC

## 2017-02-21 NOTE — Assessment & Plan Note (Signed)
Pending tsh 

## 2017-02-21 NOTE — Assessment & Plan Note (Signed)
Chronic. Trial of zoloft. Education provided regarding risks of BZD. Advised seldom use. CSC in place.

## 2017-02-21 NOTE — Progress Notes (Signed)
Pre visit review using our clinic review tool, if applicable. No additional management support is needed unless otherwise documented below in the visit note. 

## 2017-02-21 NOTE — Patient Instructions (Addendum)
Trial zoloft  Remember xanax is only as needed; this is not a daily medication. Use for panic attacks.     Our hope is for gradual improvement of mood since starting medication; however this Venier take several weeks.   If you start to have unusual thoughts, thoughts of hurting yourself, or anyone else, please go immediately to the emergency department.  u   National Suicide Prevention Hotline - available 24 hours a day, 7 days a week.  (254)487-2376  Major Depressive Disorder Major depressive disorder is a mental illness. It also Vecchio be called clinical depression or unipolar depression. Major depressive disorder usually causes feelings of sadness, hopelessness, or helplessness. Some people with this disorder do not feel particularly sad but lose interest in doing things they used to enjoy (anhedonia). Major depressive disorder also can cause physical symptoms. It can interfere with work, school, relationships, and other normal everyday activities. The disorder varies in severity but is longer lasting and more serious than the sadness we all feel from time to time in our lives. Major depressive disorder often is triggered by stressful life events or major life changes. Examples of these triggers include divorce, loss of your job or home, a move, and the death of a family member or close friend. Sometimes this disorder occurs for no obvious reason at all. People who have family members with major depressive disorder or bipolar disorder are at higher risk for developing this disorder, with or without life stressors. Major depressive disorder can occur at any age. It Swango occur just once in your life (single episode major depressive disorder). It Hedgecock occur multiple times (recurrent major depressive disorder). SYMPTOMS People with major depressive disorder have either anhedonia or depressed mood on nearly a daily basis for at least 2 weeks or longer. Symptoms of depressed mood include:  Feelings of  sadness (blue or down in the dumps) or emptiness.  Feelings of hopelessness or helplessness.  Tearfulness or episodes of crying ([place] be observed by others).  Irritability (children and adolescents). In addition to depressed mood or anhedonia or both, people with this disorder have at least four of the following symptoms:  Difficulty sleeping or sleeping too much.   Significant change (increase or decrease) in appetite or weight.   Lack of energy or motivation.  Feelings of guilt and worthlessness.   Difficulty concentrating, remembering, or making decisions.  Unusually slow movement (psychomotor retardation) or restlessness (as observed by others).   Recurrent wishes for death, recurrent thoughts of self-harm (suicide), or a suicide attempt. People with major depressive disorder commonly have persistent negative thoughts about themselves, other people, and the world. People with severe major depressive disorder Mccorkel experiencedistorted beliefs or perceptions about the world (psychotic delusions). They also Kinsella see or hear things that are not real (psychotic hallucinations). DIAGNOSIS Major depressive disorder is diagnosed through an assessment by your health care provider. Your health care provider will ask aboutaspects of your daily life, such as mood,sleep, and appetite, to see if you have the diagnostic symptoms of major depressive disorder. Your health care provider Drennen ask about your medical history and use of alcohol or drugs, including prescription medicines. Your health care provider also Chhim do a physical exam and blood work. This is because certain medical conditions and the use of certain substances can cause major depressive disorder-like symptoms (secondary depression). Your health care provider also Lamora refer you to a mental health specialist for further evaluation and treatment. TREATMENT It is important to recognize the  symptoms of major depressive disorder and seek  treatment. The following treatments can be prescribed for this disorder:   Medicine. Antidepressant medicines usually are prescribed. Antidepressant medicines are thought to correct chemical imbalances in the brain that are commonly associated with major depressive disorder. Other types of medicine Burgett be added if the symptoms do not respond to antidepressant medicines alone or if psychotic delusions or hallucinations occur.  Talk therapy. Talk therapy can be helpful in treating major depressive disorder by providing support, education, and guidance. Certain types of talk therapy also can help with negative thinking (cognitive behavioral therapy) and with relationship issues that trigger this disorder (interpersonal therapy). A mental health specialist can help determine which treatment is best for you. Most people with major depressive disorder do well with a combination of medicine and talk therapy. Treatments involving electrical stimulation of the brain can be used in situations with extremely severe symptoms or when medicine and talk therapy do not work over time. These treatments include electroconvulsive therapy, transcranial magnetic stimulation, and vagal nerve stimulation.   This information is not intended to replace advice given to you by your health care provider. Make sure you discuss any questions you have with your health care provider.   Document Released: 04/07/2013 Document Revised: 01/01/2015 Document Reviewed: 04/07/2013 Elsevier Interactive Patient Education [employer]

## 2017-02-21 NOTE — Assessment & Plan Note (Signed)
Continues to follow with neurology 

## 2017-02-21 NOTE — Progress Notes (Signed)
Subjective:    Patient ID: Debra Hogan, female    DOB: 04-27-1961, 56 y.o.   MRN: [id number]  CC: Debra Hogan is a 56 y.o. female who presents today to establish care.    HPI:  Hasnt had insurance for 2 years; disability started 07/2015. Here to reestablish.   MS- follows with neurology , Dr Hunt Oris- plese  COPD- on advair, albuterol. No SOB, wheezing. No recent flares.  Anxiety- Panic attacks. Some depression. Trouble falling asleep. No thoughts of hurting herself or anyone else.   Genital herpes- takes acyclovir daily  Ordered CPE labs         HISTORY:  Past Medical History:  Diagnosis Date  . Arthritis   . COPD (chronic obstructive pulmonary disease) (HCC)   . Depression   . Fibromyalgia   . Irritable bowel syndrome   . MS (multiple sclerosis) (HCC)    Followed by Dr. Sherryll Burger   Past Surgical History:  Procedure Laterality Date  . ABDOMINAL HYSTERECTOMY    . CHOLECYSTECTOMY N/A 01/03/2016   Procedure: LAPAROSCOPIC CHOLECYSTECTOMY WITH INTRAOPERATIVE CHOLANGIOGRAM;  Surgeon: Lattie Haw, MD;  Location: [facility];  Service: General;  Laterality: N/A;  . ENDOSCOPIC RETROGRADE CHOLANGIOPANCREATOGRAPHY (ERCP) WITH PROPOFOL N/A 01/04/2016   Procedure: ENDOSCOPIC RETROGRADE CHOLANGIOPANCREATOGRAPHY (ERCP) WITH PROPOFOL;  Surgeon: Midge Minium, MD;  Location: [facility];  Service: Endoscopy;  Laterality: N/A;  . FOOT SURGERY  2001  . LAPAROSCOPIC ENDOMETRIOSIS FULGURATION     Family History  Problem Relation Age of Onset  . Hypertension Mother   . Hyperlipidemia Mother   . Arthritis Mother   . Cancer Father     throat  . Heart disease Brother   . Bipolar disorder Son   . Schizophrenia Son   . Cancer Maternal Grandfather     liver  . Cancer Paternal Grandmother     liver    Allergies: Valium [diazepam] Current Outpatient Prescriptions on File Prior to Visit  Medication Sig Dispense Refill  . diclofenac sodium (VOLTAREN) 1 % GEL Apply 2 g topically 4  (four) times daily as needed (for pain).      No current facility-administered medications on file prior to visit.     Social History  Substance Use Topics  . Smoking status: Current Every Day Smoker    Packs/day: 1.00    Last attempt to quit: 10/21/2008  . Smokeless tobacco: Not on file  . Alcohol use No    Review of Systems  Constitutional: Negative for chills and fever.  Respiratory: Negative for cough and shortness of breath.   Cardiovascular: Negative for chest pain and palpitations.  Gastrointestinal: Negative for nausea and vomiting.  Psychiatric/Behavioral: Positive for sleep disturbance. Negative for suicidal ideas. The patient is nervous/anxious.       Objective:    BP (!) 138/102   Pulse 100   Temp 98.3 F (36.8 C) (Oral)   Ht 5\' 5"  (1.651 m)   Wt 163 lb (73.9 kg)   SpO2 93%   BMI 27.12 kg/m  BP Readings from Last 3 Encounters:  02/21/17 (!) 138/102  01/14/16 124/84  01/05/16 135/69   Wt Readings from Last 3 Encounters:  02/21/17 163 lb (73.9 kg)  01/04/16 168 lb (76.2 kg)  10/21/15 153 lb 8 oz (69.6 kg)    Physical Exam  Constitutional: She appears well-developed and well-nourished.  Eyes: Conjunctivae are normal.  Neck: No thyroid mass and no thyromegaly present.  Cardiovascular: Normal rate, regular rhythm, normal heart sounds and  normal pulses.   Pulmonary/Chest: Effort normal and breath sounds normal. She has no wheezes. She has no rhonchi. She has no rales.  Lymphadenopathy:       Head (right side): No submental, no submandibular, no tonsillar, no preauricular, no posterior auricular and no occipital adenopathy present.       Head (left side): No submental, no submandibular, no tonsillar, no preauricular, no posterior auricular and no occipital adenopathy present.    She has no cervical adenopathy.  Neurological: She is alert.  Skin: Skin is warm and dry.  Psychiatric: She has a normal mood and affect. Her speech is normal and behavior is  normal. Thought content normal.  Vitals reviewed.      Assessment & Plan:   Problem List Items Addressed This Visit      Respiratory   COPD, moderate (HCC)    No recent exacerbations. Inhalers refilled.      Relevant Medications   Fluticasone-Salmeterol (ADVAIR DISKUS) 100-50 MCG/DOSE AEPB   albuterol (PROVENTIL HFA;VENTOLIN HFA) 108 (90 Base) MCG/ACT inhaler     Endocrine   Hypothyroidism    Pending tsh      Relevant Orders   TSH (Completed)     Nervous and Auditory   Multiple sclerosis (HCC)    Continues to follow with neurology.         Genitourinary   Genital herpes    Chronic. On suppressive therapy. refilled      Relevant Medications   acyclovir (ZOVIRAX) 400 MG tablet     Other   Generalized anxiety disorder - Primary    Chronic. Trial of zoloft. Education provided regarding risks of BZD. Advised seldom use. CSC in place.      Relevant Medications   ALPRAZolam (XANAX) 0.25 MG tablet   sertraline (ZOLOFT) 50 MG tablet   Other Relevant Orders   CBC with Differential/Platelet (Completed)   Comprehensive metabolic panel (Completed)   Lipid panel (Completed)   VITAMIN D 25 Hydroxy (Vit-D Deficiency, Fractures) (Completed)       I have discontinued Debra Hogan's Dimethyl Fumarate, oxyCODONE, and amoxicillin-clavulanate. I have also changed her ALPRAZolam. Additionally, I am having her start on sertraline. Lastly, I am having her maintain her diclofenac sodium, Fluticasone-Salmeterol, albuterol, and acyclovir.   Meds ordered this encounter  Medications  . ALPRAZolam (XANAX) 0.25 MG tablet    Sig: Take 1 tablet (0.25 mg total) by mouth daily as needed for anxiety.    Dispense:  30 tablet    Refill:  0    Order Specific Question:   Supervising Provider    Answer:   Duncan Dull L [[id number]]  . sertraline (ZOLOFT) 50 MG tablet    Sig: Take 1 tablet (50 mg total) by mouth at bedtime.    Dispense:  90 tablet    Refill:  1    Order Specific Question:    Supervising Provider    Answer:   Duncan Dull L [[id number]]  . Fluticasone-Salmeterol (ADVAIR DISKUS) 100-50 MCG/DOSE AEPB    Sig: Inhale 1 puff into the lungs 2 (two) times daily as needed (for shortness of breath).    Dispense:  60 each    Refill:  3    Order Specific Question:   Supervising Provider    Answer:   Duncan Dull L [[id number]]  . albuterol (PROVENTIL HFA;VENTOLIN HFA) 108 (90 Base) MCG/ACT inhaler    Sig: Inhale 2 puffs into the lungs every 6 (six) hours as needed for wheezing or shortness of breath.  Dispense:  1 Inhaler    Refill:  1    Order Specific Question:   Supervising Provider    Answer:   Darrick Huntsman, TERESA L [[id number]]  . acyclovir (ZOVIRAX) 400 MG tablet    Sig: Take 1 tablet (400 mg total) by mouth daily.    Dispense:  90 tablet    Refill:  3    Order Specific Question:   Supervising Provider    Answer:   Sherlene Shams [[id number]]    Return precautions given.   Risks, benefits, and alternatives of the medications and treatment plan prescribed today were discussed, and patient expressed understanding.   Education regarding symptom management and diagnosis given to patient on AVS.  Continue to follow with Rennie Plowman, FNP for routine health maintenance.   Jareli D Melcher and I agreed with plan.   Rennie Plowman, FNP

## 2017-02-21 NOTE — Assessment & Plan Note (Signed)
No recent exacerbations. Inhalers refilled.

## 2017-02-21 NOTE — Assessment & Plan Note (Signed)
Chronic. On suppressive therapy. refilled

## 2017-02-22 ENCOUNTER — Encounter: Payer: Self-pay | Admitting: Family

## 2017-02-22 ENCOUNTER — Telehealth: Payer: Self-pay | Admitting: Family

## 2017-02-22 DIAGNOSIS — A6004 Herpesviral vulvovaginitis: Secondary | ICD-10-CM

## 2017-02-22 MED ORDER — ACYCLOVIR 400 MG PO TABS: 400.0000 mg | ORAL_TABLET | Freq: Every day | ORAL | 3 refills | Status: DC

## 2017-02-22 NOTE — Telephone Encounter (Signed)
Medication has been refilled.

## 2017-02-22 NOTE — Telephone Encounter (Signed)
Pt called about needing medication of acyclovir (ZOVIRAX) 400 MG tablet to be refilled. Please advise?  Pharmacy is [facility] [address], [place] - [address]  Call pt @ [phone]. Thank You!

## 2017-02-26 ENCOUNTER — Telehealth: Payer: Self-pay | Admitting: *Deleted

## 2017-02-26 NOTE — Telephone Encounter (Signed)
LMTCB

## 2017-02-26 NOTE — Telephone Encounter (Signed)
Voicemail; Pt requested a call to discuss her side effects for sertraline.  Pt contact [phone]

## 2017-02-27 NOTE — Telephone Encounter (Signed)
LMTCB

## 2017-02-27 NOTE — Telephone Encounter (Signed)
Spoke with pt and stated that she started taking her sertraline about 4 days ago. She stated that the day after the first dose she started having a constant headache. Then by the 3rd day of taking the medication she started having chest pain and a dull achy pain in her left arm. The pt stated that all the symptoms are gone now and she stopped taking the medication last night. Please advise.

## 2017-02-28 NOTE — Telephone Encounter (Signed)
Call pt-  So sorry about symptoms on zoloft  Please advise f/u appt so we can decide on another medication if she would like for anxiety.

## 2017-03-01 NOTE — Telephone Encounter (Signed)
Left message for patient to return call back.  

## 2017-03-05 NOTE — Telephone Encounter (Signed)
Patient has been informed. Patient stated she will call to schedule appointment at a later time.

## 2017-04-05 ENCOUNTER — Ambulatory Visit: Payer: 59 | Admitting: Family

## 2017-04-05 ENCOUNTER — Telehealth: Payer: Self-pay | Admitting: Family

## 2017-04-05 ENCOUNTER — Encounter: Payer: Self-pay | Admitting: Family

## 2017-04-05 DIAGNOSIS — E785 Hyperlipidemia, unspecified: Secondary | ICD-10-CM | POA: Insufficient documentation

## 2017-04-05 NOTE — Telephone Encounter (Signed)
FYI - Pt called and cancelled appt. Pt states that she had a rough night and is unable to pull herself together to come for her appt.

## 2017-04-05 NOTE — Progress Notes (Deleted)
Subjective:    Patient ID: Debra Hogan, female    DOB: 1961/06/13, 56 y.o.   MRN: [id number]  CC: Debra Hogan is a 56 y.o. female who presents today for follow up.   HPI:  Anxiety and depression- trial of zoloft   HLD- lovaza  ?not on synthroid  Due for mammogram, pap    HISTORY:  Past Medical History:  Diagnosis Date  . Arthritis   . COPD (chronic obstructive pulmonary disease) (HCC)   . Depression   . Fibromyalgia   . Irritable bowel syndrome   . MS (multiple sclerosis) (HCC)    Followed by Dr. Sherryll Burger   Past Surgical History:  Procedure Laterality Date  . ABDOMINAL HYSTERECTOMY    . CHOLECYSTECTOMY N/A 01/03/2016   Procedure: LAPAROSCOPIC CHOLECYSTECTOMY WITH INTRAOPERATIVE CHOLANGIOGRAM;  Surgeon: Lattie Haw, MD;  Location: [facility];  Service: General;  Laterality: N/A;  . ENDOSCOPIC RETROGRADE CHOLANGIOPANCREATOGRAPHY (ERCP) WITH PROPOFOL N/A 01/04/2016   Procedure: ENDOSCOPIC RETROGRADE CHOLANGIOPANCREATOGRAPHY (ERCP) WITH PROPOFOL;  Surgeon: Midge Minium, MD;  Location: [facility] END[facility];  Service: Endoscopy;  Laterality: N/A;  . FOOT SURGERY  2001  . LAPAROSCOPIC ENDOMETRIOSIS FULGURATION     Family History  Problem Relation Age of Onset  . Hypertension Mother   . Hyperlipidemia Mother   . Arthritis Mother   . Cancer Father     throat  . Heart disease Brother   . Bipolar disorder Son   . Schizophrenia Son   . Cancer Maternal Grandfather     liver  . Cancer Paternal Grandmother     liver    Allergies: Valium [diazepam] Current Outpatient Prescriptions on File Prior to Visit  Medication Sig Dispense Refill  . acyclovir (ZOVIRAX) 400 MG tablet Take 1 tablet (400 mg total) by mouth daily. 90 tablet 3  . albuterol (PROVENTIL HFA;VENTOLIN HFA) 108 (90 Base) MCG/ACT inhaler Inhale 2 puffs into the lungs every 6 (six) hours as needed for wheezing or shortness of breath. 1 Inhaler 1  . ALPRAZolam (XANAX) 0.25 MG tablet Take 1 tablet (0.25 mg total) by mouth  daily as needed for anxiety. 30 tablet 0  . diclofenac sodium (VOLTAREN) 1 % GEL Apply 2 g topically 4 (four) times daily as needed (for pain).     . Fluticasone-Salmeterol (ADVAIR DISKUS) 100-50 MCG/DOSE AEPB Inhale 1 puff into the lungs 2 (two) times daily as needed (for shortness of breath). 60 each 3   No current facility-administered medications on file prior to visit.     Social History  Substance Use Topics  . Smoking status: Current Every Day Smoker    Packs/day: 1.00    Last attempt to quit: 10/21/2008  . Smokeless tobacco: Not on file  . Alcohol use No    Review of Systems    Objective:    There were no vitals taken for this visit. BP Readings from Last 3 Encounters:  02/21/17 (!) 138/102  01/14/16 124/84  01/05/16 135/69   Wt Readings from Last 3 Encounters:  02/21/17 163 lb (73.9 kg)  01/04/16 168 lb (76.2 kg)  10/21/15 153 lb 8 oz (69.6 kg)    Physical Exam     Assessment & Plan:   Problem List Items Addressed This Visit    None       I am having Ms. Redwine maintain her diclofenac sodium, ALPRAZolam, Fluticasone-Salmeterol, albuterol, and acyclovir.   No orders of the defined types were placed in this encounter.   Return precautions given.  Risks, benefits, and alternatives of the medications and treatment plan prescribed today were discussed, and patient expressed understanding.   Education regarding symptom management and diagnosis given to patient on AVS.  Continue to follow with Rennie Plowman, FNP for routine health maintenance.   Kimble D Low and I agreed with plan.   Rennie Plowman, FNP

## 2017-04-05 NOTE — Telephone Encounter (Signed)
FYI

## 2017-04-30 ENCOUNTER — Ambulatory Visit: Payer: 59 | Admitting: Family

## 2017-05-01 ENCOUNTER — Telehealth: Payer: Self-pay | Admitting: Family

## 2017-05-01 ENCOUNTER — Encounter: Payer: Self-pay | Admitting: Family

## 2017-05-01 DIAGNOSIS — I671 Cerebral aneurysm, nonruptured: Secondary | ICD-10-CM | POA: Insufficient documentation

## 2017-05-01 NOTE — Telephone Encounter (Signed)
Call pt  Recently seen by dr Regino Schultze and BP was very high.   She needs follow up with me asap  Any CP, HA, SOB- advise ED.

## 2017-05-01 NOTE — Telephone Encounter (Signed)
Left message for patient to return call back.  

## 2017-05-02 NOTE — Telephone Encounter (Signed)
Patient has been informed. And has been transferred up front to schedule appointment.

## 2017-05-07 ENCOUNTER — Ambulatory Visit (INDEPENDENT_AMBULATORY_CARE_PROVIDER_SITE_OTHER): Payer: BLUE CROSS/BLUE SHIELD | Admitting: Family

## 2017-05-07 ENCOUNTER — Encounter: Payer: Self-pay | Admitting: Family

## 2017-05-07 VITALS — BP 138/78 | HR 106 | Temp 98.3°F | Ht 65.0 in | Wt 163.4 lb

## 2017-05-07 DIAGNOSIS — R300 Dysuria: Secondary | ICD-10-CM | POA: Diagnosis not present

## 2017-05-07 DIAGNOSIS — J449 Chronic obstructive pulmonary disease, unspecified: Secondary | ICD-10-CM

## 2017-05-07 DIAGNOSIS — R002 Palpitations: Secondary | ICD-10-CM

## 2017-05-07 DIAGNOSIS — F411 Generalized anxiety disorder: Secondary | ICD-10-CM

## 2017-05-07 LAB — POCT URINALYSIS DIPSTICK
BILIRUBIN UA: NEGATIVE
GLUCOSE UA: NEGATIVE
LEUKOCYTES UA: NEGATIVE
NITRITE UA: NEGATIVE
PH UA: 6 (ref 5.0–8.0)
Protein, UA: 100
Spec Grav, UA: 1.025 (ref 1.010–1.025)
Urobilinogen, UA: 0.2 E.U./dL

## 2017-05-07 LAB — URINALYSIS, MICROSCOPIC ONLY
RBC / HPF: NONE SEEN (ref 0–?)
WBC, UA: NONE SEEN (ref 0–?)

## 2017-05-07 LAB — COMPREHENSIVE METABOLIC PANEL
ALT: 14 U/L (ref 0–35)
AST: 20 U/L (ref 0–37)
Albumin: 4.2 g/dL (ref 3.5–5.2)
Alkaline Phosphatase: 80 U/L (ref 39–117)
BILIRUBIN TOTAL: 0.4 mg/dL (ref 0.2–1.2)
BUN: 12 mg/dL (ref 6–23)
CALCIUM: 9.7 mg/dL (ref 8.4–10.5)
CO2: 33 meq/L — AB (ref 19–32)
CREATININE: 0.82 mg/dL (ref 0.40–1.20)
Chloride: 105 mEq/L (ref 96–112)
GFR: 76.19 mL/min (ref >60.00)
Glucose, Bld: 113 mg/dL — ABNORMAL HIGH (ref 70–99)
Potassium: 3.8 mEq/L (ref 3.5–5.1)
SODIUM: 141 meq/L (ref 135–145)
Total Protein: 7.1 g/dL (ref 6.0–8.3)

## 2017-05-07 LAB — MAGNESIUM: Magnesium: 2.1 mg/dL (ref 1.5–2.5)

## 2017-05-07 LAB — TSH: TSH: 3.38 u[IU]/mL (ref 0.35–4.50)

## 2017-05-07 MED ORDER — ESCITALOPRAM OXALATE 10 MG PO TABS: 10.0000 mg | ORAL_TABLET | Freq: Every day | ORAL | 1 refills | Status: DC

## 2017-05-07 NOTE — Assessment & Plan Note (Addendum)
Sao2 prior to walking was 95 and dropped to 93% and 94% while walking. In no acute respiratory distress. Copd [place] also be contributing to elevation in HR. Advised f/u next week and BID use of advair.

## 2017-05-07 NOTE — Assessment & Plan Note (Addendum)
Reassured as patient is well appearing and no acute distress.   EKG shows sinus tachycardia. When compared to 2014, no significant changes. No ischemia. Discussed cardiology consult and patient politely declined and would like to follow up with me in one to see if anxiety contributory.  Pending lab studies, urine. F/u next week.

## 2017-05-07 NOTE — Assessment & Plan Note (Addendum)
Suspect anxiety contributing to heart rate. Trial lexapro to see if treating underlying anxiety improves HR. F/u 8 weeks.

## 2017-05-07 NOTE — Patient Instructions (Addendum)
Pending labs  Trial starting lexapro.   Follow up 8 weeks  Let me know if you have another episode of palpitations or any new symptoms.

## 2017-05-07 NOTE — Progress Notes (Signed)
Pre visit review using our clinic review tool, if applicable. No additional management support is needed unless otherwise documented below in the visit note. 

## 2017-05-07 NOTE — Assessment & Plan Note (Signed)
Pending urine studies. 

## 2017-05-07 NOTE — Progress Notes (Signed)
Subjective:    Patient ID: Debra Hogan, female    DOB: 21-Feb-1961, 56 y.o.   MRN: [id number]  CC: Debra Hogan is a 56 y.o. female who presents today for follow up.   HPI: Recent elevated BP at vascular. Very anxious that day.   Does describe incident last night at kitchen table where heart racing, felt dizzy . No CP at that time. Anxious at that time.   Denies exertional chest pain or pressure, numbness or tingling radiating to left arm or jaw,  dizziness, frequent headaches, changes in vision, or shortness of breath.   She also describes suprapubic pressure, dysuria x one day. No N, V, F  GAD- QOD uses xanax for panic attacks. Has tried zoloft, gabapentin, cymbalta with all side effects . NO thoughts of hurting herself or anyone else. No suicide plan. Does have guns in house.    COPD- at 'baseline' SOB. Does endorse wheezing last night. No fever. Used her advair and resolved. Doesn't use advair everyday. rescue inhalers 'too expensive.' No LE swelling.             HISTORY:  Past Medical History:  Diagnosis Date  . Arthritis   . COPD (chronic obstructive pulmonary disease) (HCC)   . Depression   . Fibromyalgia   . Irritable bowel syndrome   . MS (multiple sclerosis) (HCC)    Followed by Dr. Sherryll Burger   Past Surgical History:  Procedure Laterality Date  . ABDOMINAL HYSTERECTOMY    . CHOLECYSTECTOMY N/A 01/03/2016   Procedure: LAPAROSCOPIC CHOLECYSTECTOMY WITH INTRAOPERATIVE CHOLANGIOGRAM;  Surgeon: Lattie Haw, MD;  Location: [facility] OR[facility];  Service: General;  Laterality: N/A;  . ENDOSCOPIC RETROGRADE CHOLANGIOPANCREATOGRAPHY (ERCP) WITH PROPOFOL N/A 01/04/2016   Procedure: ENDOSCOPIC RETROGRADE CHOLANGIOPANCREATOGRAPHY (ERCP) WITH PROPOFOL;  Surgeon: Midge Minium, MD;  Location: [facility];  Service: Endoscopy;  Laterality: N/A;  . FOOT SURGERY  2001  . LAPAROSCOPIC ENDOMETRIOSIS FULGURATION     Family History  Problem Relation Age of Onset  . Hypertension Mother     . Hyperlipidemia Mother   . Arthritis Mother   . Cancer Father        throat  . Heart disease Brother   . Bipolar disorder Son   . Schizophrenia Son   . Cancer Maternal Grandfather        liver  . Cancer Paternal Grandmother        liver    Allergies: Valium [diazepam] Current Outpatient Prescriptions on File Prior to Visit  Medication Sig Dispense Refill  . acyclovir (ZOVIRAX) 400 MG tablet Take 1 tablet (400 mg total) by mouth daily. 90 tablet 3  . ALPRAZolam (XANAX) 0.25 MG tablet Take 1 tablet (0.25 mg total) by mouth daily as needed for anxiety. 30 tablet 0  . diclofenac sodium (VOLTAREN) 1 % GEL Apply 2 g topically 4 (four) times daily as needed (for pain).     . Fluticasone-Salmeterol (ADVAIR DISKUS) 100-50 MCG/DOSE AEPB Inhale 1 puff into the lungs 2 (two) times daily as needed (for shortness of breath). 60 each 3   No current facility-administered medications on file prior to visit.     Social History  Substance Use Topics  . Smoking status: Current Every Day Smoker    Packs/day: 1.00    Last attempt to quit: 10/21/2008  . Smokeless tobacco: Never Used  . Alcohol use No    Review of Systems  Constitutional: Negative for chills and fever.  Eyes: Negative for visual disturbance.  Respiratory:  Positive for shortness of breath and wheezing. Negative for cough.   Cardiovascular: Positive for palpitations. Negative for chest pain and leg swelling.  Gastrointestinal: Negative for nausea and vomiting.  Genitourinary: Positive for dysuria. Negative for hematuria.  Neurological: Negative for dizziness and headaches.      Objective:    BP 138/78   Pulse (!) 106   Temp 98.3 F (36.8 C) (Oral)   Ht 5\' 5"  (1.651 m)   Wt 163 lb 6.4 oz (74.1 kg)   SpO2 95%   BMI 27.19 kg/m  BP Readings from Last 3 Encounters:  05/07/17 138/78  02/21/17 (!) 138/102  01/14/16 124/84   Wt Readings from Last 3 Encounters:  05/07/17 163 lb 6.4 oz (74.1 kg)  02/21/17 163 lb (73.9 kg)   01/04/16 168 lb (76.2 kg)    Physical Exam  Constitutional: She appears well-developed and well-nourished.  Eyes: Conjunctivae are normal.  Cardiovascular: Regular rhythm, normal heart sounds and normal pulses.  Tachycardia present.   Pulmonary/Chest: Effort normal and breath sounds normal. She has no wheezes. She has no rhonchi. She has no rales.  Abdominal: There is no CVA tenderness.  Neurological: She is alert.  Skin: Skin is warm and dry.  Psychiatric: She has a normal mood and affect. Her speech is normal and behavior is normal. Thought content normal.  Vitals reviewed.      Assessment & Plan:   Problem List Items Addressed This Visit      Respiratory   COPD, moderate (HCC)    Sao2 prior to walking was 95 and dropped to 93% and 94% while walking. In no acute respiratory distress. Copd Narducci also be contributing to elevation in HR. Advised f/u next week and BID use of advair.         Other   Generalized anxiety disorder - Primary    Suspect anxiety contributing to heart rate. Trial lexapro to see if treating underlying anxiety improves HR. F/u 8 weeks.       Relevant Medications   escitalopram (LEXAPRO) 10 MG tablet   Palpitations    Reassured as patient is well appearing and no acute distress.   EKG shows sinus tachycardia. When compared to 2014, no significant changes. No ischemia. Discussed cardiology consult and patient politely declined and would like to follow up with me in one to see if anxiety contributory.  Pending lab studies, urine. F/u next week.       Relevant Orders   Comprehensive metabolic panel (Completed)   Magnesium (Completed)   TSH (Completed)   EKG 12-Lead (Completed)   Dysuria    Pending urine studies.       Relevant Orders   POCT urinalysis dipstick (Completed)   Urine Culture   Urine Microscopic Only (Completed)       I have discontinued Ms. Tholl's albuterol. I am also having her start on escitalopram. Additionally, I am having her  maintain her diclofenac sodium, ALPRAZolam, Fluticasone-Salmeterol, and acyclovir.   Meds ordered this encounter  Medications  . escitalopram (LEXAPRO) 10 MG tablet    Sig: Take 1 tablet (10 mg total) by mouth daily.    Dispense:  30 tablet    Refill:  1    Order Specific Question:   Supervising Provider    Answer:   Sherlene Shams [[id number]]    Return precautions given.   Risks, benefits, and alternatives of the medications and treatment plan prescribed today were discussed, and patient expressed understanding.   Education regarding symptom  management and diagnosis given to patient on AVS.  Continue to follow with Allegra Grana, FNP for routine health maintenance.   Kamilla D Tretter and I agreed with plan.   Rennie Plowman, FNP

## 2017-05-08 LAB — URINE CULTURE: Organism ID, Bacteria: NO GROWTH

## 2017-05-09 ENCOUNTER — Other Ambulatory Visit: Payer: Self-pay | Admitting: Family

## 2017-05-09 DIAGNOSIS — R319 Hematuria, unspecified: Secondary | ICD-10-CM

## 2017-05-14 ENCOUNTER — Encounter: Payer: Self-pay | Admitting: Family

## 2017-05-14 ENCOUNTER — Ambulatory Visit (INDEPENDENT_AMBULATORY_CARE_PROVIDER_SITE_OTHER): Payer: BLUE CROSS/BLUE SHIELD | Admitting: Family

## 2017-05-14 DIAGNOSIS — J449 Chronic obstructive pulmonary disease, unspecified: Secondary | ICD-10-CM

## 2017-05-14 DIAGNOSIS — R002 Palpitations: Secondary | ICD-10-CM

## 2017-05-14 DIAGNOSIS — F411 Generalized anxiety disorder: Secondary | ICD-10-CM

## 2017-05-14 NOTE — Patient Instructions (Addendum)
Pleasure seeing you  Follow up appointment after surgery and let me know how you are doing  Appointment

## 2017-05-14 NOTE — Progress Notes (Signed)
Subjective:    Patient ID: Debra Hogan, female    DOB: 1961-04-22, 56 y.o.   MRN: [id number]  CC: Debra Hogan is a 56 y.o. female who presents today for follow up.   HPI: Palpitations- Improved. Does had 2 episodes for a minutes 2 nights ago. At the time, did feel anxious.  Denies exertional chest pain or pressure, numbness or tingling radiating to left arm or jaw, dizziness, frequent headaches, changes in vision, or shortness of breath during episode.   COPD- no increased sputum, cough, SOB.   Anxiety- started on lexapro and feeling better however only been a couple of weeks, 'think it is also too soon to tell.' No thought of hurting herself or anyone else.   Plans to surgery for aneurysm in couple of weeks.     HISTORY:  Past Medical History:  Diagnosis Date  . Arthritis   . COPD (chronic obstructive pulmonary disease) (HCC)   . Depression   . Fibromyalgia   . Irritable bowel syndrome   . MS (multiple sclerosis) (HCC)    Followed by Debra Hogan   Past Surgical History:  Procedure Laterality Date  . ABDOMINAL HYSTERECTOMY    . CHOLECYSTECTOMY N/A 01/03/2016   Procedure: LAPAROSCOPIC CHOLECYSTECTOMY WITH INTRAOPERATIVE CHOLANGIOGRAM;  Surgeon: Debra Haw, MD;  Location: [facility] OR[facility];  Service: General;  Laterality: N/A;  . ENDOSCOPIC RETROGRADE CHOLANGIOPANCREATOGRAPHY (ERCP) WITH PROPOFOL N/A 01/04/2016   Procedure: ENDOSCOPIC RETROGRADE CHOLANGIOPANCREATOGRAPHY (ERCP) WITH PROPOFOL;  Surgeon: Debra Minium, MD;  Location: [facility];  Service: Endoscopy;  Laterality: N/A;  . FOOT SURGERY  2001  . LAPAROSCOPIC ENDOMETRIOSIS FULGURATION     Family History  Problem Relation Age of Onset  . Hypertension Mother   . Hyperlipidemia Mother   . Arthritis Mother   . Cancer Father        throat  . Heart disease Brother   . Bipolar disorder Son   . Schizophrenia Son   . Cancer Maternal Grandfather        liver  . Cancer Paternal Grandmother        liver    Allergies:  Valium [diazepam] Current Outpatient Prescriptions on File Prior to Visit  Medication Sig Dispense Refill  . acyclovir (ZOVIRAX) 400 MG tablet Take 1 tablet (400 mg total) by mouth daily. 90 tablet 3  . ALPRAZolam (XANAX) 0.25 MG tablet Take 1 tablet (0.25 mg total) by mouth daily as needed for anxiety. 30 tablet 0  . diclofenac sodium (VOLTAREN) 1 % GEL Apply 2 g topically 4 (four) times daily as needed (for pain).     Marland Kitchen escitalopram (LEXAPRO) 10 MG tablet Take 1 tablet (10 mg total) by mouth daily. 30 tablet 1  . Fluticasone-Salmeterol (ADVAIR DISKUS) 100-50 MCG/DOSE AEPB Inhale 1 puff into the lungs 2 (two) times daily as needed (for shortness of breath). 60 each 3   No current facility-administered medications on file prior to visit.     Social History  Substance Use Topics  . Smoking status: Current Every Day Smoker    Packs/day: 1.00    Last attempt to quit: 10/21/2008  . Smokeless tobacco: Never Used  . Alcohol use No    Review of Systems  Constitutional: Negative for chills and fever.  Eyes: Negative for visual disturbance.  Respiratory: Negative for cough, shortness of breath and wheezing.   Cardiovascular: Positive for palpitations. Negative for chest pain.  Gastrointestinal: Negative for nausea and vomiting.  Neurological: Negative for dizziness.  Objective:    BP 128/86 (BP Location: Left Arm, Patient Position: Sitting, Cuff Size: Normal)   Pulse (!) 104   Temp 98.5 F (36.9 C) (Oral)   Wt 152 lb 4 oz (69.1 kg)   SpO2 96%   BMI 25.34 kg/m  BP Readings from Last 3 Encounters:  05/14/17 128/86  05/07/17 138/78  02/21/17 (!) 138/102   Wt Readings from Last 3 Encounters:  05/14/17 152 lb 4 oz (69.1 kg)  05/07/17 163 lb 6.4 oz (74.1 kg)  02/21/17 163 lb (73.9 kg)    Physical Exam  Constitutional: She appears well-developed and well-nourished.  Eyes: Conjunctivae are normal.  Cardiovascular: Normal rate, regular rhythm, normal heart sounds and normal  pulses.   Pulmonary/Chest: Effort normal and breath sounds normal. She has no wheezes. She has no rhonchi. She has no rales.  Neurological: She is alert.  Skin: Skin is warm and dry.  Psychiatric: She has a normal mood and affect. Her speech is normal and behavior is normal. Thought content normal.  Vitals reviewed.      Assessment & Plan:   Problem List Items Addressed This Visit      Respiratory   COPD, moderate (HCC)    Stable. sa02 96. Will continue current regimen.         Other   Generalized anxiety disorder    Improvement. 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 Will follow 6-8 weeks.       Palpitations    HR continues to be mildly elevated. Largely asymptomatic. Declines cardiology appointment at this time. Reassured by lab work done prior. Feeling better on lexapro and will continue to give the medication more time to see if anxiety is driving HR. Patient agreed with plan.           I am having Debra Hogan maintain her diclofenac sodium, ALPRAZolam, Fluticasone-Salmeterol, acyclovir, escitalopram, and clopidogrel.   Meds ordered this encounter  Medications  . clopidogrel (PLAVIX) 75 MG tablet    Sig: Take 75 mg by mouth.    Return precautions given.   Risks, benefits, and alternatives of the medications and treatment plan prescribed today were discussed, and patient expressed understanding.   Education regarding symptom management and diagnosis given to patient on AVS.  Continue to follow with Debra Grana, FNP for routine health maintenance.   Debra Hogan and I agreed with plan.   Debra Plowman, FNP

## 2017-05-16 NOTE — Assessment & Plan Note (Signed)
Improvement. 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 Will follow 6-8 weeks.

## 2017-05-16 NOTE — Assessment & Plan Note (Signed)
Stable. sa02 96. Will continue current regimen.

## 2017-05-16 NOTE — Assessment & Plan Note (Signed)
HR continues to be mildly elevated. Largely asymptomatic. Declines cardiology appointment at this time. Reassured by lab work done prior. Feeling better on lexapro and will continue to give the medication more time to see if anxiety is driving HR. Patient agreed with plan.

## 2017-05-17 ENCOUNTER — Telehealth: Payer: Self-pay

## 2017-05-17 NOTE — Progress Notes (Signed)
Left voice mail to call back 

## 2017-05-18 NOTE — Telephone Encounter (Signed)
Pt is returning phone call, not sure what the call is about. Pt is home from hospital and she can be reach at home.

## 2017-05-18 NOTE — Progress Notes (Signed)
Spoke with patient states since she last saw you she hasn't had any palpitations since last visit.   Surgery went great for cerebral aneurysm.

## 2017-05-18 NOTE — Progress Notes (Signed)
Patient is currently in hospital FYI

## 2017-05-18 NOTE — Progress Notes (Signed)
Left message as to patient needs to call office to scheduled folllow up appointment for 6-8 weeks, admitted [facility] at this time.

## 2017-05-19 DIAGNOSIS — M792 Neuralgia and neuritis, unspecified: Secondary | ICD-10-CM | POA: Insufficient documentation

## 2017-05-22 ENCOUNTER — Telehealth: Payer: Self-pay | Admitting: Family

## 2017-05-22 NOTE — Telephone Encounter (Signed)
Patients phone called returned on 05/18/17 see date of service 05/14/17 for documentation.

## 2017-05-22 NOTE — Telephone Encounter (Signed)
Please call pt  When seen by neuro- wanted [place] to discuss cont treatment with acyclovir.    Please have her make f/u at some point

## 2017-05-22 NOTE — Telephone Encounter (Signed)
Left message for patient to return call back.  

## 2017-05-23 NOTE — Telephone Encounter (Signed)
Patient has appointment in July and would like to wait til then to discuss treatment.

## 2017-05-23 NOTE — Telephone Encounter (Signed)
Please call pt at [phone]

## 2017-06-29 ENCOUNTER — Ambulatory Visit (INDEPENDENT_AMBULATORY_CARE_PROVIDER_SITE_OTHER): Payer: BLUE CROSS/BLUE SHIELD | Admitting: Family Medicine

## 2017-06-29 ENCOUNTER — Encounter: Payer: Self-pay | Admitting: Family Medicine

## 2017-06-29 DIAGNOSIS — M79601 Pain in right arm: Secondary | ICD-10-CM | POA: Diagnosis not present

## 2017-06-29 MED ORDER — TRAMADOL HCL 50 MG PO TABS: 50.0000 mg | ORAL_TABLET | Freq: Three times a day (TID) | ORAL | 0 refills | Status: DC | PRN

## 2017-06-29 NOTE — Patient Instructions (Signed)
Use the tramadol as needed.  Follow up with your PCP regarding plan of action/referrals regarding your pain.  Take care  Dr. Adriana Simas

## 2017-06-29 NOTE — Assessment & Plan Note (Signed)
New problem. Patient with several concerning factors: Cervical disc disease, shoulder arthritis, fibromyalgia, MS. Treating with tramadol. Patient is to follow-up with her primary care physician regarding referral/further imaging/workup.

## 2017-06-29 NOTE — Progress Notes (Signed)
Subjective:  Patient ID: Debra Hogan, female    DOB: 1961-10-19  Age: 56 y.o. MRN: [id number]  CC: R arm pain  HPI:  56 year old female with MS, fibromyalgia, cervical disc disease presents with right arm pain.  Patient reports a 3 week history of right arm pain. She reports that the pain extends from the shoulder down. She reports associated tingling in the fingers and weakness. Worse with activity. Improves with rest. Pain is severe. No recent fall, trauma, injury. She's tried Tylenol and taken some oxycodone with some improvement. She notes that she has shoulder arthritis as well as cervical disc disease. She also has significant fibromyalgia and multiple sclerosis. No other associated symptoms. No other complaints or concerns at this time.  Social Hx   Social History   Social History  . Marital status: Married    Spouse name: N/A  . Number of children: N/A  . Years of education: N/A   Social History Main Topics  . Smoking status: Current Every Day Smoker    Packs/day: 1.00    Last attempt to quit: 10/21/2008  . Smokeless tobacco: Never Used  . Alcohol use No  . Drug use: No  . Sexual activity: Not Asked   Other Topics Concern  . None   Social History Narrative   Lives in [place] with son. Widowed x 2.      Married for 10 years.       On disability          Review of Systems  Musculoskeletal:       R arm pain.  Neurological:       Paresthesia.   Objective:  BP 132/84 (BP Location: Left Arm, Patient Position: Sitting, Cuff Size: Normal)   Pulse (!) 111   Temp 99 F (37.2 C) (Oral)   Wt 165 lb (74.8 kg)   SpO2 92%   BMI 27.46 kg/m   BP/Weight 06/29/2017 05/14/2017 05/07/2017  Systolic BP 132 128 138  Diastolic BP 84 86 78  Wt. (Lbs) 165 152.25 163.4  BMI 27.46 25.34 27.19    Physical Exam  Constitutional: She is oriented to person, place, and time. She appears well-developed.  Appears in pain.  Cardiovascular: Normal rate and regular rhythm.     Pulmonary/Chest: Effort normal. She has no wheezes. She has no rales.  Musculoskeletal:  Shoulder/arm - Right Patient has diffuse tenderness to palpation throughout her arm. Decreased range of motion secondary to pain. Rotator cuff muscle strength 4/5. Questionable impingement. Exam is very difficult secondary to severe pain.  Neurological: She is alert and oriented to person, place, and time.  Psychiatric:  Flat affect.  Vitals reviewed.   Lab Results  Component Value Date   WBC 7.5 02/21/2017   HGB 15.9 (H) 02/21/2017   HCT 46.7 (H) 02/21/2017   PLT 250.0 02/21/2017   GLUCOSE 113 (H) 05/07/2017   CHOL 293 (H) 02/21/2017   TRIG (H) 02/21/2017    581.0 Triglyceride is over 400; calculations on Lipids are invalid.   HDL 42.70 02/21/2017   LDLDIRECT 134.0 02/21/2017   LDLCALC 136 (H) 09/19/2013   ALT 14 05/07/2017   AST 20 05/07/2017   NA 141 05/07/2017   K 3.8 05/07/2017   CL 105 05/07/2017   CREATININE 0.82 05/07/2017   BUN 12 05/07/2017   CO2 33 (H) 05/07/2017   TSH 3.38 05/07/2017   INR 1.0 10/22/2015    Assessment & Plan:   Problem List Items Addressed This Visit  Other   Right arm pain    New problem. Patient with several concerning factors: Cervical disc disease, shoulder arthritis, fibromyalgia, MS. Treating with tramadol. Patient is to follow-up with her primary care physician regarding referral/further imaging/workup.         Meds ordered this encounter  Medications  . baclofen (LIORESAL) 10 MG tablet    Refill:  4  . carbamazepine (CARBATROL) 100 MG 12 hr capsule    Refill:  1  . traMADol (ULTRAM) 50 MG tablet    Sig: Take 1-2 tablets (50-100 mg total) by mouth every 8 (eight) hours as needed.    Dispense:  60 tablet    Refill:  0   Follow-up: PRN  Everlene Other DO [facility]

## 2017-07-03 MED FILL — TECFIDERA/240MG/CPDR: TECFIDERA/240MG/CPDR | 30 days supply | Qty: 60 | Fill #0

## 2017-07-11 ENCOUNTER — Other Ambulatory Visit: Payer: Self-pay | Admitting: Family

## 2017-07-11 DIAGNOSIS — F411 Generalized anxiety disorder: Secondary | ICD-10-CM

## 2017-07-13 ENCOUNTER — Other Ambulatory Visit: Payer: Self-pay

## 2017-07-13 DIAGNOSIS — F411 Generalized anxiety disorder: Secondary | ICD-10-CM

## 2017-07-13 MED ORDER — ESCITALOPRAM OXALATE 10 MG PO TABS: 10.0000 mg | ORAL_TABLET | Freq: Every day | ORAL | 1 refills | Status: DC

## 2017-07-14 ENCOUNTER — Telehealth: Payer: Self-pay | Admitting: Family

## 2017-07-14 DIAGNOSIS — F411 Generalized anxiety disorder: Secondary | ICD-10-CM

## 2017-07-16 ENCOUNTER — Encounter: Payer: Self-pay | Admitting: Family

## 2017-07-16 ENCOUNTER — Ambulatory Visit (INDEPENDENT_AMBULATORY_CARE_PROVIDER_SITE_OTHER): Payer: BLUE CROSS/BLUE SHIELD | Admitting: Family

## 2017-07-16 VITALS — BP 138/80 | HR 94 | Temp 98.2°F | Ht 65.0 in | Wt 165.4 lb

## 2017-07-16 DIAGNOSIS — M7989 Other specified soft tissue disorders: Secondary | ICD-10-CM

## 2017-07-16 DIAGNOSIS — F411 Generalized anxiety disorder: Secondary | ICD-10-CM | POA: Diagnosis not present

## 2017-07-16 DIAGNOSIS — M799 Soft tissue disorder, unspecified: Secondary | ICD-10-CM | POA: Diagnosis not present

## 2017-07-16 MED ORDER — SERTRALINE HCL 50 MG PO TABS: 50.0000 mg | ORAL_TABLET | Freq: Every day | ORAL | 3 refills | Status: DC

## 2017-07-16 NOTE — Patient Instructions (Addendum)
If knot on right wrist recurs or grows in size, please let me know and we order ultrasound.   Stop LEXAPRO.    Start zoloft AT BEDTIME TOMORROW so not to take on same day.  Follow up 8 weeks.

## 2017-07-16 NOTE — Progress Notes (Signed)
Subjective:    Patient ID: Debra Hogan, female    DOB: [birth year], 56 y.o.   MRN: [id number]  CC: Debra Hogan is a 56 y.o. female who presents today for follow up.   HPI:  Depression - stopped lexapro aggravated MS symptoms, particularly eye pain which has since resolved. lexapro did help with depression and would like to try different medication. No thoughts hurting herself or anyone else.   Describes 'knot' on right inner wrist which appeared couple weeks ago, she "popped it" and it resolved. Nontender, no drainage. No numbness or tingling.   Declines mammogram.      HISTORY:  Past Medical History:  Diagnosis Date  . Arthritis   . COPD (chronic obstructive pulmonary disease) (HCC)   . Depression   . Fibromyalgia   . Irritable bowel syndrome   . MS (multiple sclerosis) (HCC)    Followed by Dr. Sherryll Burger   Past Surgical History:  Procedure Laterality Date  . ABDOMINAL HYSTERECTOMY    . CHOLECYSTECTOMY N/A 01/03/2016   Procedure: LAPAROSCOPIC CHOLECYSTECTOMY WITH INTRAOPERATIVE CHOLANGIOGRAM;  Surgeon: Lattie Haw, MD;  Location: [facility] ORS;  Service: General;  Laterality: N/A;  . ENDOSCOPIC RETROGRADE CHOLANGIOPANCREATOGRAPHY (ERCP) WITH PROPOFOL N/A 01/04/2016   Procedure: ENDOSCOPIC RETROGRADE CHOLANGIOPANCREATOGRAPHY (ERCP) WITH PROPOFOL;  Surgeon: Midge Minium, MD;  Location: [facility] ENDOSCOPY;  Service: Endoscopy;  Laterality: N/A;  . FOOT SURGERY  2001  . LAPAROSCOPIC ENDOMETRIOSIS FULGURATION     Family History  Problem Relation Age of Onset  . Hypertension Mother   . Hyperlipidemia Mother   . Arthritis Mother   . Cancer Father        throat  . Heart disease Brother   . Bipolar disorder Son   . Schizophrenia Son   . Cancer Maternal Grandfather        liver  . Cancer Paternal Grandmother        liver    Allergies: Valium [diazepam] Current Outpatient Prescriptions on File Prior to Visit  Medication Sig Dispense Refill  . acyclovir (ZOVIRAX) 400 MG tablet Take  1 tablet (400 mg total) by mouth daily. 90 tablet 3  . ALPRAZolam (XANAX) 0.25 MG tablet Take 1 tablet (0.25 mg total) by mouth daily as needed for anxiety. 30 tablet 0  . baclofen (LIORESAL) 10 MG tablet   4  . carbamazepine (CARBATROL) 100 MG 12 hr capsule   1  . clopidogrel (PLAVIX) 75 MG tablet Take 75 mg by mouth.    . diclofenac sodium (VOLTAREN) 1 % GEL Apply 2 g topically 4 (four) times daily as needed (for pain).     . Fluticasone-Salmeterol (ADVAIR DISKUS) 100-50 MCG/DOSE AEPB Inhale 1 puff into the lungs 2 (two) times daily as needed (for shortness of breath). 60 each 3  . traMADol (ULTRAM) 50 MG tablet Take 1-2 tablets (50-100 mg total) by mouth every 8 (eight) hours as needed. 60 tablet 0   No current facility-administered medications on file prior to visit.     Social History  Substance Use Topics  . Smoking status: Current Every Day Smoker    Packs/day: 1.00    Last attempt to quit: 10/21/2008  . Smokeless tobacco: Never Used  . Alcohol use No    Review of Systems  Constitutional: Negative for chills and fever.  Respiratory: Negative for cough.   Cardiovascular: Negative for chest pain and palpitations.  Gastrointestinal: Negative for nausea and vomiting.  Psychiatric/Behavioral: Negative for suicidal ideas.      Objective:  BP 138/80   Pulse 94   Temp 98.2 F (36.8 C) (Oral)   Ht 5\' 5"  (1.651 m)   Wt 165 lb 6.4 oz (75 kg)   SpO2 93%   BMI 27.52 kg/m  BP Readings from Last 3 Encounters:  07/16/17 138/80  06/29/17 132/84  05/14/17 128/86   Wt Readings from Last 3 Encounters:  07/16/17 165 lb 6.4 oz (75 kg)  06/29/17 165 lb (74.8 kg)  05/14/17 152 lb 4 oz (69.1 kg)    Physical Exam  Constitutional: She appears well-developed and well-nourished.  Eyes: Conjunctivae are normal.  Cardiovascular: Normal rate, regular rhythm, normal heart sounds and normal pulses.   Pulmonary/Chest: Effort normal and breath sounds normal. She has no wheezes. She has no  rhonchi. She has no rales.  Neurological: She is alert.  Skin: Skin is warm and dry.      Less than 1 cm well-circumscribed nodule noted right wrist. no discharge, erythema, streaking  Psychiatric: She has a normal mood and affect. Her speech is normal and behavior is normal. Thought content normal.  Vitals reviewed.      Assessment & Plan:   Problem List Items Addressed This Visit      Other   Generalized anxiety disorder - Primary    Trial of Zoloft to see if does not aggravate MS symptoms. She was on low-dose Lexapro, appropriate to go ahead and switch her immediately to Zoloft without taper. Follow-up 8 weeks.      Relevant Medications   sertraline (ZOLOFT) 50 MG tablet   Soft tissue mass    New.  Discussed staying vigilant with patient. She will let me know if knot doesn't resolve on its own or new symptoms develop- we will proceed ultrasound at that time.         Of note, advised patient that if nodule right wrist does not resolve on its own or grows in size, patient will let me know we will pursue ultrasound  I have discontinued Ms. Blossom's escitalopram. I am also having her start on sertraline. Additionally, I am having her maintain her diclofenac sodium, ALPRAZolam, Fluticasone-Salmeterol, acyclovir, clopidogrel, baclofen, carbamazepine, and traMADol.   Meds ordered this encounter  Medications  . sertraline (ZOLOFT) 50 MG tablet    Sig: Take 1 tablet (50 mg total) by mouth at bedtime.    Dispense:  90 tablet    Refill:  3    Order Specific Question:   Supervising Provider    Answer:   Sherlene Shams [[id number]]    Return precautions given.   Risks, benefits, and alternatives of the medications and treatment plan prescribed today were discussed, and patient expressed understanding.   Education regarding symptom management and diagnosis given to patient on AVS.  Continue to follow with Allegra Grana, FNP for routine health maintenance.   Sarann D Ullman and I  agreed with plan.   Rennie Plowman, FNP

## 2017-07-16 NOTE — Assessment & Plan Note (Addendum)
Trial of Zoloft to see if does not aggravate MS symptoms. She was on low-dose Lexapro, appropriate to go ahead and switch her immediately to Zoloft without taper. Follow-up 8 weeks.

## 2017-07-16 NOTE — Progress Notes (Signed)
Pre visit review using our clinic review tool, if applicable. No additional management support is needed unless otherwise documented below in the visit note. 

## 2017-07-17 DIAGNOSIS — M7989 Other specified soft tissue disorders: Secondary | ICD-10-CM | POA: Insufficient documentation

## 2017-07-17 NOTE — Assessment & Plan Note (Signed)
New.  Discussed staying vigilant with patient. She will let me know if knot doesn't resolve on its own or new symptoms develop- we will proceed ultrasound at that time.

## 2017-07-30 NOTE — Unmapped (Signed)
[facility] Pharm[facility] Refill Coordination Note  Medication: TECFIDERA    Unable to reach patient to schedule shipment for medication being filled at [facility]. Left voicemail on phone.  As this is the 3rd unsuccessful attempt to reach the patient, no additional phone call attempts will be made at this time.      Phone numbers attempted: [phone]  Last scheduled delivery: SHIPPED 07/03/17    Please call the [facility] at [phone] (option 4) should you have any further questions.      Thanks,  [facility] Specialty Team

## 2017-07-30 NOTE — Unmapped (Signed)
[facility] Pharm[facility] Refill Coordination Note  Specialty Medication(s): Tecfidera  Additional Medications shipped: none    Jennifer Lewis, DOB: 1961-09-07  Phone: [phone] (home) , Alternate phone contact: N/A  Phone or address changes today?: No  All above HIPAA information was verified with patient.  Shipping Address: [address]  [place] [address]   Insurance changes? No    Completed refill call assessment today to schedule patient's medication shipment from the [facility] [phone]).      Confirmed the medication and dosage are correct and have not changed: Yes, regimen is correct and unchanged.    Confirmed patient started or stopped the following medications in the past month:  No, there are no changes reported at this time.    Are you tolerating your medication?:  Jennifer Lewis reports tolerating the medication.    ADHERENCE      Did you miss any doses in the past 4 weeks? No missed doses reported.    FINANCIAL/SHIPPING    Delivery Scheduled: Yes, Expected medication delivery date: 08/01/17     Jennifer Lewis did not have any additional questions at this time.    Delivery address validated in FSI scheduling system: Yes, address listed in FSI is correct.    We will follow up with patient monthly for standard refill processing and delivery.      Thank you,  Rollen Sox   [facility] Shared [facility] Specialty Pharmacist

## 2017-07-31 MED FILL — TECFIDERA/240MG/CPDR: TECFIDERA/240MG/CPDR | 30 days supply | Qty: 60 | Fill #1

## 2017-08-15 MED ORDER — CARBAMAZEPINE ER 200 MG CAPSULE,EXTENDED RELEASE MPHASE12HR
ORAL_CAPSULE | Freq: Two times a day (BID) | ORAL | 2 refills | 0.00000 days | Status: CP
Start: 2017-08-15 — End: 2017-11-30

## 2017-08-22 ENCOUNTER — Ambulatory Visit
Admission: RE | Admit: 2017-08-22 | Discharge: 2017-08-22 | Disposition: A | Discharge status: Home / Self Care | Payer: BC Managed Care – PPO

## 2017-08-22 DIAGNOSIS — I671 Cerebral aneurysm, nonruptured: Principal | ICD-10-CM

## 2017-08-24 MED FILL — TECFIDERA/240MG/CPDR: TECFIDERA/240MG/CPDR | 30 days supply | Qty: 60 | Fill #2

## 2017-08-24 NOTE — Unmapped (Signed)
Patient has new insurance starting 9/1 - I go info and uploaded into epic. Tomasa Blase will plan to run medication next week to see if new PA is required. Moving call a little earlier next month to accommodate any issues that Sprunger arise with new insurance plan.      [facility] Special[facility] Pharm[facility] Refill and Clinical Coordination Note  Medication(s): Josy Peaden Armentor, DOB: [birth year]  Phone: [phone] (home) , Alternate phone contact: N/A  Shipping address: [address]  [place] [address]  Phone or address changes today?: No  All above HIPAA information verified.  Insurance changes? No    Completed refill and clinical call assessment today to schedule patient's medication shipment from the [facility] [phone]).      MEDICATION RECONCILIATION    Confirmed the medication and dosage are correct and have not changed: Yes, regimen is correct and unchanged.    Were there any changes to your medication(s) in the past month:  No, there are no changes reported at this time. (clopidogrel, carbamazepine, escitalopram all currently on med list. She reported these as new)    ADHERENCE    Is this medicine transplant or covered by Medicare Part B? No.    Did you miss any doses in the past 4 weeks? No missed doses reported.  Adherence counseling provided? Not needed     SIDE EFFECT MANAGEMENT    Are you tolerating your medication?:  Charlita reports tolerating the medication.  Side effect management discussed: None      Therapy is appropriate and should be continued.    Evidence of clinical benefit: See Epic note from 05/08/17      FINANCIAL/SHIPPING    Delivery Scheduled: Yes, Expected medication delivery date: Tues, Sept 4   Additional medications refilled: No additional medications/refills needed at this time.    Camerin did not have any additional questions at this time.    Delivery address validated in FSI scheduling system: Yes, address listed above is correct.      We will follow up with patient monthly for standard refill processing and delivery.      Thank you,  [facility] Specialty Pharmacist

## 2017-09-13 NOTE — Unmapped (Signed)
Specialty Pharmacy - Neurology Medication Clinical Assessment and Refill Coordination      Jennifer Lewis is a 56 y.o. female contacted today regarding refills of her specialty medication(s) dimethyl fumarate (TECFIDERA)      Reviewed and verified with patient: Allergies - Medications -      Specialty medication(s) and dose(s) confirmed: yes  Changes to medications: no  Changes to insurance: yes, patient now has Medicare with [employer] supplemental. The pharmacy is using this to bill but will need to be updated when she comes for her next clinic visit. Instructed her to bring the insurance cards with her.    Medication Adherence    Patient reported X missed doses in the last month:  0  Specialty Medication:  TECFIDERA  Patient is on additional specialty medications:  No  Patient is on more than two specialty medications:  No  Informant:  patient  Reliability of informant:  reliable  Patient is at risk for Non-Adherence:  No  Reasons for non-adherence:  no problems identified  Adherence tools used:  patient uses a pill box to manage medications  Confirmed plan for next specialty medication refill:  delivery by pharmacy       Refill Coordination    Has the Patients' Contact Information Changed:  No  Is the Shipping Address Different:  No       Shipping Information    Delivery Scheduled:  Yes  Delivery Date:  09/19/17  Medications to be Shipped:  TECFIDERA       Adverse Effects    Flushing:  Pos (Comment: not a new symptom, states aspirin has been helping)       Drug Interactions    Drug interactions evaluated:  yes  Clinically relevant drug interactions identified:  no  Provided the patient with educational material regarding drug interactions:  not applicable         Patient Counseling    Counseled the patient on the following:  doses and administration discussed, possible adverse effects and management discussed, lab monitoring and follow-up discussed, pharmacy contact information discussed         Worthy Flank, PharmD Clinical Pharmacist, [facility]  Phone: [phone]

## 2017-09-18 MED FILL — TECFIDERA/240MG/CPDR: TECFIDERA/240MG/CPDR | 30 days supply | Qty: 60 | Fill #3

## 2017-09-27 ENCOUNTER — Ambulatory Visit
Admission: RE | Admit: 2017-09-27 | Discharge: 2017-09-27 | Disposition: A | Discharge status: Home / Self Care | Payer: MEDICARE | Attending: Neurology | Admitting: Neurology

## 2017-09-27 DIAGNOSIS — G35 Multiple sclerosis: Principal | ICD-10-CM

## 2017-09-27 DIAGNOSIS — F419 Anxiety disorder, unspecified: Secondary | ICD-10-CM

## 2017-09-27 DIAGNOSIS — I671 Cerebral aneurysm, nonruptured: Secondary | ICD-10-CM

## 2017-09-27 DIAGNOSIS — F329 Major depressive disorder, single episode, unspecified: Secondary | ICD-10-CM

## 2017-09-27 DIAGNOSIS — R4189 Other symptoms and signs involving cognitive functions and awareness: Secondary | ICD-10-CM

## 2017-09-27 LAB — RED BLOOD CELL COUNT: Lab: 4.72

## 2017-09-27 LAB — HEPATIC FUNCTION PANEL
ALBUMIN: 4.3 g/dL (ref 3.5–5.0)
ALKALINE PHOSPHATASE: 84 U/L (ref 38–126)
PROTEIN TOTAL: 7 g/dL (ref 6.5–8.3)

## 2017-09-27 LAB — CBC W/ AUTO DIFF
BASOPHILS ABSOLUTE COUNT: 0 10*9/L (ref 0.0–0.1)
EOSINOPHILS ABSOLUTE COUNT: 0.1 10*9/L (ref 0.0–0.4)
HEMATOCRIT: 44.4 % (ref 36.0–46.0)
HEMOGLOBIN: 15 g/dL (ref 12.0–16.0)
LARGE UNSTAINED CELLS: 2 % (ref 0–4)
LYMPHOCYTES ABSOLUTE COUNT: 0.5 10*9/L — ABNORMAL LOW (ref 1.5–5.0)
MEAN CORPUSCULAR HEMOGLOBIN CONC: 33.8 g/dL (ref 31.0–37.0)
MEAN CORPUSCULAR HEMOGLOBIN: 31.8 pg (ref 26.0–34.0)
MEAN CORPUSCULAR VOLUME: 94 fL (ref 80.0–100.0)
MEAN PLATELET VOLUME: 9.1 fL (ref 7.0–10.0)
MONOCYTES ABSOLUTE COUNT: 0.5 10*9/L (ref 0.2–0.8)
RED BLOOD CELL COUNT: 4.72 10*12/L (ref 4.00–5.20)
RED CELL DISTRIBUTION WIDTH: 13 % (ref 12.0–15.0)
WBC ADJUSTED: 7.5 10*9/L (ref 4.5–11.0)

## 2017-09-27 LAB — ALT (SGPT): Alanine aminotransferase:CCnc:Pt:Ser/Plas:Qn:: 33

## 2017-09-27 MED ORDER — BACLOFEN 10 MG TABLET
ORAL_TABLET | Freq: Three times a day (TID) | ORAL | 5 refills | 0.00000 days | Status: CP | PRN
Start: 2017-09-27 — End: 2018-01-02

## 2017-09-27 MED ORDER — DONEPEZIL 5 MG TABLET
ORAL_TABLET | Freq: Every evening | ORAL | 2 refills | 0 days | Status: CP
Start: 2017-09-27 — End: 2017-10-26

## 2017-09-27 NOTE — Unmapped (Signed)
Please schedule with me in 3 months.     ?   In case of:  ? a suspected relapse (new symptoms or worsening existing symptoms, lasting for >24h)  OR  ? a need for an additional appointment for other reasons     Please contact:    [facility] Desk  Phone: [phone]      OR   Ms. Webb Laws  Administrative [facility], Department of Neurology  [id number]  [place], [place] [address]     Phone: [phone], Fax: [phone]      Sarita Bottom, MD  Clinical Associate Professor of Neurology  [facility], Department of Neurology  Multiple Sclerosis/Neuroimmunology Division  [address] [place], [place], [place] [address]

## 2017-09-27 NOTE — Unmapped (Signed)
[facility]  Multiple Sclerosis/Neuroimmunology Division  Sarita Bottom, MD  Associate Professor of Neurology    DATE OF VISIT: 09/27/17    Re:  Jennifer Lewis  [address]  [place] [address]  MRN: [id number]  DOB: 1961/02/13      Direct entry by: Dr. Desma Mcgregor [facility]    Visit:Follow-up Visit      REASON FOR VISIT: Jennifer Lewis, a 56 y.o. Caucasian  female, is seen in consultation at the [facility], Multiple Sclerosis/Neuroimmunology Division for the follow-up of [name] was last time seen on 05/08/2017.       Assessment:     1. I took a detailed history of the present illness fromMs. Jennifer Lewis , details on past medical history, family history and social history.  2. I personally reviewed  patient's prior medical records, radiology reports, and laboratory work.    3. I personally reviewed the patient???s prior MRI images  and have discussed MRI findings with the patient.   4.  I performed neurological examination and completed the [place] Expanded Disability Status (EDSS) scale.  5.  Jennifer Lewis agreed with the recommended diagnostic plan.  6. with Jennifer Lewis, who agreed with the recommended treatment plan.                                                                                                                                                  ?? Multiple sclerosis:  Jennifer Lewis has been diagnosed with RRMS. She was on Tecfidera since 2015 and stopped in Nov/2017 due to financial issues. She re-started Tecfidera in [place] 2018 and tolerates it well.  Follow-up me is planned  in 3 months, or earlier, if needed.     ?? Intracranial aneurysms:  Follow-up with neurosurgery. Aneurysms could contribute to headaches.     ?? Hypertension:  Noted again at today's visit. Follow-up with PCP.       Plan:     1. CBC/diff, LFTS  2. Continue with Tecfidera. Continue with Aricept for cognitive symptoms.   3. Communicate with neurosurgery for how long she should be on 2 antiplatelet agents, Plavix and aspirin. Response received- for 6 months after the neurosurgical procedure was done, neurosurgery will communicate with the patient.  Follow-up with neurosurgery for brain aneurysm.  4. Continue Carbamazepine for spasms, 200 mg BID.  5. Continue Baclofen as prescribed  6. See PCP for the management of depression.   6. Follow-up with me in 3 months, or earlier, if needed.       Subjective:       HISTORY OF PRESENT ILLNESS: She was treated since 2011 (per notes) for fibromyalgia. Her fist MS symptom seems to be pain in her  legs in summer 2015.  MRI was done that showed lesions. Has not received steroids. Was on Lyrica that stopped in 2016 because of financial reasons. Had weight gain with Lyrica, too. She was on Tecfidera since 2015 and stopped in Nov/2017 due to financial issues. She tolerated Tecfidera well. Takes Baclofen PRN, for spasms, if she needs to walk for a quarter of a mile , she usees a cane, gets fatigued after about 200 feet. Fatigue is present since 2010.  Did not take any medications for fatigue.  Has bladder urgency, takes Ditropan that helps.  She is sometimes constipated. Has bilateral paroxysmal leg spasms when she lies down since 2011. She restarted with Tecfidera in [place] 2018, tolerates well. No flushings.    She is depressed.  Has frequent headaches: on the top of the head and on the back, temples, sometimes behind the eyes and below and above the eyes. The pain is stabbing. Typically does not get nautious with her headaches, but sometimes has nausea.  Has anxiety attacks when talking to people or sometimes when she is alone, too.   Takes Vitamin D 1000IU/day.  Has sharp pain in her feet. Has pain behind both eyes. Does not report new onset visual blurriness.     She states she is on disability since 2  years ago.  She can walk 1/4 mile without stopping. She does not take Ditropan over the last month. She does not want to do follow-up MRIs for financial reasons. She is on Plavix and aspirin, as recommended by neurosurgery after the endovascular treatment of intracranial aneurysm.She still takes Baclofen TID.     Please see below the results of the diagnostic work-up performed so far.   ............................................................................................................................................Marland Kitchen  DIAGNOSTIC STUDIES / REVIEW OF RECORDS:    Prior medical records: [facility], [facility], [facility]    MRI:  05/29/2015: Brain MRI with and w/o contrast, report: Multiple white matter lesions in a distribution consistent with multiple sclerosis. No enhancing lesions.:   02/17/2017: Brain MRI with and w/o contrast, report:Stable burden of the disease with multiple white matter lesions as detailed above. No new enhancing lesions are visualized. There is a 8 mm bilobed aneurysm of the paraclinoid left internal carotid artery that has been present since 2015 comparison.  04/01/2017: MRA head with and w/o contrast: Unchanged appearance of bilateral ICA aneurysms as above. COMPARISON: MRI brain from 02/17/2017.  08/22/2017: Brain MRI with and w/o contrast and brain MRA report (COMPARISON: Brain MRI 02/17/2017. Brain MRA 04/01/2017. Cerebral angiogram 05/17/2017): -Interval left ICA pipeline stent placement. Evolving thrombus within the occluded left ophthalmic artery aneurysm. No evidence of flow within the aneurysm. -Unchanged right cavernous internal carotid artery aneurysm measuring 4 mm. -Numerous T2/flair hyperintensities within the subcortical and periventricular white matter are consistent with the known history of multiple sclerosis. There is a single new lesion within the left periventricular white matter when compared to the most recent MRI 02/17/2017.      Lumbar puncture:   Per patient report, she did the spinal tap and the results were  the confirmation for MS Blood tests:   Office Visit on 09/27/2017   Component Date Value Ref Range Status   ??? Albumin 09/27/2017 4.3  3.5 - 5.0 g/dL Final   ??? Total Protein 09/27/2017 7.0  6.5 - 8.3 g/dL Final   ??? Total Bilirubin 09/27/2017 0.4  0.0 - 1.2 mg/dL Final   ??? Bilirubin, Direct 09/27/2017 <0.10  0.00 - 0.40 mg/dL Final   ???  AST 09/27/2017 34  14 - 38 U/L Final   ??? ALT 09/27/2017 33  15 - 48 U/L Final   ??? Alkaline Phosphatase 09/27/2017 84  38 - 126 U/L Final   ??? WBC 09/27/2017 7.5  4.5 - 11.0 10*9/L Final   ??? RBC 09/27/2017 4.72  4.00 - 5.20 10*12/L Final   ??? HGB 09/27/2017 15.0  12.0 - 16.0 g/dL Final   ??? HCT 45/40/9811 44.4  36.0 - 46.0 % Final   ??? MCV 09/27/2017 94.0  80.0 - 100.0 fL Final   ??? MCH 09/27/2017 31.8  26.0 - 34.0 pg Final   ??? MCHC 09/27/2017 33.8  31.0 - 37.0 g/dL Final   ??? RDW 91/47/8295 13.0  12.0 - 15.0 % Final   ??? MPV 09/27/2017 9.1  7.0 - 10.0 fL Final   ??? Platelet 09/27/2017 207  150 - 440 10*9/L Final   ??? Neutrophil Left Shift 09/27/2017 1+* Not Present Final   ??? Absolute Neutrophils 09/27/2017 6.1  2.0 - 7.5 10*9/L Final   ??? Absolute Lymphocytes 09/27/2017 0.5* 1.5 - 5.0 10*9/L Final   ??? Absolute Monocytes 09/27/2017 0.5  0.2 - 0.8 10*9/L Final   ??? Absolute Eosinophils 09/27/2017 0.1  0.0 - 0.4 10*9/L Final   ??? Absolute Basophils 09/27/2017 0.0  0.0 - 0.1 10*9/L Final   ??? Large Unstained Cells 09/27/2017 2  0 - 4 % Final   Admission on 05/17/2017, Discharged on 05/18/2017   Component Date Value Ref Range Status   ??? Sodium 05/17/2017 144  135 - 145 mmol/L Final   ??? Potassium 05/17/2017 3.7  3.5 - 5.0 mmol/L Final   ??? Chloride 05/17/2017 109* 98 - 107 mmol/L Final   ??? CO2 05/17/2017 25.0  22.0 - 30.0 mmol/L Final   ??? BUN 05/17/2017 11  7 - 21 mg/dL Final   ??? Creatinine 05/17/2017 0.67  0.60 - 1.00 mg/dL Final   ??? BUN/Creatinine Ratio 05/17/2017 16   Final   ??? EGFR MDRD Non Af Amer 05/17/2017 >=60  >=60 mL/min/1.57m2 Final   ??? EGFR MDRD Af Amer 05/17/2017 >=60  >=60 mL/min/1.34m2 Final   ??? Anion Gap 05/17/2017 10  9 - 15 mmol/L Final   ??? Glucose 05/17/2017 124* 65 - 99 mg/dL Final   ??? Calcium 62/13/0865 8.1* 8.5 - 10.2 mg/dL Final   ??? Albumin 78/46/9629 3.4* 3.5 - 5.0 g/dL Final   ??? Total Protein 05/17/2017 6.0* 6.6 - 8.0 g/dL Final   ??? Total Bilirubin 05/17/2017 0.4  0.0 - 1.2 mg/dL Final   ??? AST 52/84/1324 40* 14 - 38 U/L Final   ??? ALT 05/17/2017 23  15 - 48 U/L Final   ??? Alkaline Phosphatase 05/17/2017 70  38 - 126 U/L Final   ??? PT 05/17/2017 13.3  10.3 - 13.3 sec Final   ??? INR 05/17/2017 1.14   Final   ??? APTT 05/17/2017 270.0* 27.7 - 37.7 sec Final   ??? Heparin Correlation 05/17/2017 1.2   Final   ??? Check Type 05/17/2017 Needed Type Check   Final   ??? ABO Grouping 05/17/2017 O NEG   Final   ??? Antibody Screen 05/17/2017 NEG   Final   ??? WBC 05/17/2017 6.2  4.5 - 11.0 10*9/L Final   ??? RBC 05/17/2017 4.15  4.00 - 5.20 10*12/L Final   ??? HGB 05/17/2017 13.1  12.0 - 16.0 g/dL Final   ??? HCT 40/09/2724 38.5  36.0 - 46.0 % Final   ???  MCV 05/17/2017 92.7  80.0 - 100.0 fL Final   ??? MCH 05/17/2017 31.6  26.0 - 34.0 pg Final   ??? MCHC 05/17/2017 34.0  31.0 - 37.0 g/dL Final   ??? RDW 16/09/9603 12.9  12.0 - 15.0 % Final   ??? MPV 05/17/2017 7.5  7.0 - 10.0 fL Final   ??? Platelet 05/17/2017 192  150 - 440 10*9/L Final   ??? Absolute Neutrophils 05/17/2017 4.7  2.0 - 7.5 10*9/L Final   ??? Absolute Lymphocytes 05/17/2017 1.0* 1.5 - 5.0 10*9/L Final   ??? Absolute Monocytes 05/17/2017 0.3  0.2 - 0.8 10*9/L Final   ??? Absolute Eosinophils 05/17/2017 0.1  0.0 - 0.4 10*9/L Final   ??? Absolute Basophils 05/17/2017 0.0  0.0 - 0.1 10*9/L Final   ??? Large Unstained Cells 05/17/2017 2  0 - 4 % Final   ??? Glucose, POC 05/17/2017 130  65 - 179 mg/dL Final   ??? Glucose, POC 05/17/2017 123  65 - 179 mg/dL Final   ??? Sodium 54/08/8118 142  135 - 145 mmol/L Final   ??? Potassium 05/18/2017 4.2  3.5 - 5.0 mmol/L Final   ??? Chloride 05/18/2017 107  98 - 107 mmol/L Final   ??? CO2 05/18/2017 28.0  22.0 - 30.0 mmol/L Final   ??? BUN 05/18/2017 12  7 - 21 mg/dL Final ??? Creatinine 14/78/2956 0.73  0.60 - 1.00 mg/dL Final   ??? BUN/Creatinine Ratio 05/18/2017 16   Final   ??? EGFR MDRD Non Af Amer 05/18/2017 >=60  >=60 mL/min/1.64m2 Final   ??? EGFR MDRD Af Amer 05/18/2017 >=60  >=60 mL/min/1.23m2 Final   ??? Anion Gap 05/18/2017 7* 9 - 15 mmol/L Final   ??? Glucose 05/18/2017 87  65 - 179 mg/dL Final   ??? Calcium 21/30/8657 8.4* 8.5 - 10.2 mg/dL Final   ??? WBC 84/69/6295 8.3  4.5 - 11.0 10*9/L Final   ??? RBC 05/18/2017 3.87* 4.00 - 5.20 10*12/L Final   ??? HGB 05/18/2017 12.3  12.0 - 16.0 g/dL Final   ??? HCT 28/41/3244 36.1  36.0 - 46.0 % Final   ??? MCV 05/18/2017 93.2  80.0 - 100.0 fL Final   ??? MCH 05/18/2017 31.7  26.0 - 34.0 pg Final   ??? MCHC 05/18/2017 34.0  31.0 - 37.0 g/dL Final   ??? RDW 12/27/7251 12.7  12.0 - 15.0 % Final   ??? MPV 05/18/2017 7.9  7.0 - 10.0 fL Final   ??? Platelet 05/18/2017 205  150 - 440 10*9/L Final   ??? Magnesium 05/18/2017 1.9  1.6 - 2.2 mg/dL Final   ??? Phosphorus 05/18/2017 4.2  2.4 - 4.5 mg/dL Final   ??? Glucose, POC 05/18/2017 113  65 - 179 mg/dL Final   Office Visit on 05/08/2017   Component Date Value Ref Range Status   ??? Angio Convert Enzyme 05/08/2017 47  8 - 53 U/L Final   ??? Antinuclear Antibodies (ANA) 05/08/2017 Positive* Negative Final   ??? Pattern 1 (ANA) 05/08/2017 Speckled   Final   ??? ANA Titer 1 05/08/2017 1:80   Final   ??? Pattern 2 (ANA) 05/08/2017 Homogenous   Final   ??? ANA Titer 2 05/08/2017 1:80   Final   ??? Vitamin D Total (25OH) 05/08/2017 38.7  20.0 - 80.0 ng/mL Final   ??? CRP 05/08/2017 12.2* <10.0 mg/L Final   ??? WBC 05/08/2017 6.5  4.5 - 11.0 10*9/L Final   ??? RBC 05/08/2017 4.69  4.00 - 5.20 10*12/L  Final   ??? HGB 05/08/2017 14.7  12.0 - 16.0 g/dL Final   ??? HCT 29/56/2130 43.6  36.0 - 46.0 % Final   ??? MCV 05/08/2017 93.0  80.0 - 100.0 fL Final   ??? MCH 05/08/2017 31.3  26.0 - 34.0 pg Final   ??? MCHC 05/08/2017 33.6  31.0 - 37.0 g/dL Final   ??? RDW 86/57/8469 12.5  12.0 - 15.0 % Final   ??? MPV 05/08/2017 9.4  7.0 - 10.0 fL Final   ??? Platelet 05/08/2017 275 150 - 440 10*9/L Final   ??? Absolute Neutrophils 05/08/2017 4.7  2.0 - 7.5 10*9/L Final   ??? Absolute Lymphocytes 05/08/2017 0.9* 1.5 - 5.0 10*9/L Final   ??? Absolute Monocytes 05/08/2017 0.6  0.2 - 0.8 10*9/L Final   ??? Absolute Eosinophils 05/08/2017 0.1  0.0 - 0.4 10*9/L Final   ??? Absolute Basophils 05/08/2017 0.0  0.0 - 0.1 10*9/L Final   ??? Large Unstained Cells 05/08/2017 3  0 - 4 % Final     05/10/15: RF, vitamin B12; WNL.    06/26/16: TSH 5.96 (ref. 0.600 - 3.300 uIU/mL).       Other:   04/27/2017: seen Dr Judie Petit. Wang for the evaluation of cerebral aneurysms.  05/01/2017: Cerebral angiogram: 1. Superiorly projecting 7 x 6 mm left ophthalmic aneurysm. 2. Inferiorly projecting 5 x 3 right posterior communicating aneurysm. 3. 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    .............................................................................................................................................    Past Medical History:     Diagnosis Date   ??? Aneurysm (CMS-HCC), Cerebral    ??? COPD (chronic obstructive pulmonary disease) (CMS-HCC)    ??? Fibromyalgia    ??? MS (multiple sclerosis) (CMS-HCC)        ALLERGIES:     Allergen Reactions   ??? Diazepam Anxiety     Current Outpatient Prescriptions   Medication Sig Dispense Refill   ??? acyclovir (ZOVIRAX) 400 MG tablet Take by mouth daily.      ??? aspirin 81 MG chewable tablet Chew 1 tablet (81 mg total) daily. 30 tablet 0   ??? carBAMazepine (CARBATROL) 200 MG 12 hr capsule Take 1 capsule (200 mg total) by mouth Two (2) times a day. 60 capsule 2   ??? cholecalciferol, vitamin D3, (VITAMIN D3) 1,000 unit capsule Take 1,000 Units by mouth daily.     ??? clopidogrel (PLAVIX) 75 mg tablet Take 1 tablet (75 mg total) by mouth daily. Start taking one week prior to planned procedure. 90 tablet 3   ??? dimethyl fumarate (TECFIDERA) 240 mg CpDR Take 1 capsule (240 mg total) by mouth Two (2) times a day with meals. 60 capsule 2   ??? ondansetron (ZOFRAN-ODT) 4 MG disintegrating tablet Take 4 mg by mouth every six (6) hours as needed for nausea.     ??? baclofen (LIORESAL) 10 MG tablet Take 1 tablet (10 mg total) by mouth Three (3) times a day as needed for muscle spasms. 30 tablet 5   ??? donepezil (ARICEPT) 5 MG tablet Take 1 tablet (5 mg total) by mouth nightly. 30 tablet 2   ??? escitalopram oxalate (LEXAPRO) 10 MG tablet Take 10 mg by mouth.     ??? fluticasone-salmeterol (ADVAIR) 100-50 mcg/dose diskus Inhale 1 puff Two (2) times a day.       No current facility-administered medications for this visit.            Past Surgical History:     Procedure Laterality Date   ???  CHOLECYSTECTOMY     ??? colonoscopy     ??? HYSTERECTOMY     ??? IR EMBOLIZATION INTRACRANIAL SPINAL  05/17/2017           Social History:    Social History     Social History   ??? Marital status: Married     Spouse name: N/A   ??? Number of children: N/A   ??? Years of education: N/A     Social History Main Topics   ??? Smoking status: Current Every Day Smoker     Packs/day: 1.00     Years: 38.00     Types: Cigarettes   ??? Smokeless tobacco: Never Used   ??? Alcohol use No   ??? Drug use: No   ??? Sexual activity: Not Asked     Other Topics Concern   ??? None     Social History Narrative   ??? None       Family History:     Problem Relation Age of Onset   ??? Cancer Father    ??? Hypertension Brother    ??? Heart disease Brother    ??? Multiple sclerosis Neg Hx    ??? Aneurysm Neg Hx         Review of Systems:  A 10-systems review was performed and, unless otherwise noted, declared negative by patient.    Objective:     Physical Exam:  Blood pressure 140/79, pulse 99, height 165.1 cm (5' 5), weight 75.4 kg (166 lb 4.8 oz).   General Appearance: in no acute distress. Normal skin color, afebrile.  Heart and lungs: Regular heart rate. Eupneic, normal respiratory rate. Abdomen: Soft. No peripheral edema, peripheral pulses palpable.        NEUROLOGICAL EXAMINATION:     General:  Alert and oriented to person, place, time and situation.    Recent and remote memory intact.    Attention span and concentration normal.    Language and spontaneous speech normal, no dysarthria or aphasia.  Naming/fluency/repetition intact.  Fund of knowledge normal.  Following lateralizing commands across midline .      Cranial Nerves:     II, III- Pupils are equal and reactive to light b/l (direct and consensual reactions). Visual Acuity: with glasses: L 20/70-2, R 20/50-3. No visual field defect.   III, IV, VI- extra ocular movements are intact, No ptosis, no diplopia, no nystagmus.  V- sensation of the face intact b/l.   VII- face symmetrical, no facial droop, normal facial movements with smile/grimace  VIII- Hearing grossly intact. Weber test: sound is symmetrical with no lateralization.  IX and X- symmetric palate contraction, normal gag bilaterally, no dysarthria, no dysphagia.  XI- Full shoulder shrug bilaterally; no wasting, normal tone and strength of sternocleidomastoid muscles bilaterally.  XII- No tongue atrophy, no tongue fasciculations; tongue protrudes midline, full range of movements of the tongue.    Neck flexion normal.    Motor Exam:     Normal bulk. Fasciculations not observed.     Muscle strength:    Muscles UEs  LEs    R L  R L   Deltoids 5/5 5/5 Hip flexors  4+/5 4+/5   Biceps 5/5 5/5 Hip extensors 5/5 5/5   Triceps 5/5 5/5 Knee flexors 5/5 5/5   Hand grip 5/5 5/5 Knee extensors 5/5 5/5   Wrist flexors 5/5 5/5 Foot dorsal flexors 5/5 5/5   Wrist extensors 5/5 5/5 Foot plantar flexors 5/5 5/5   Finger flexors 5/5 5/5  Toes dorsal flexors 5/5 5/5   Finger extensors 5/5 5/5 Toes plantar flexors 5/5 5/5        Reflexes R L   Biceps +3 +3   Brachioradialis  +3 +3   Triceps +3 +3   Patella +3 +3   Achilles +2 +2     Normal tone b/l. Negative Babinski sign bilaterally.    Sensory system:  ? Superficial light touch sensation: WNL  ? Vibration sense: decerased in all 4 limbs  ? Position sense: WNL  ? Pinprick test for pain sensation: WNL  (WNL= within normal limits; UE= upper extremities; LE= lower extremities; R= right, L= left).    Cerebellar/Coordination:  Rapid alternating movements, finger-to-nose and heel-to-shin  bilaterally demonstrate no abnormalities. No limb ataxia bilaterally. No gait ataxia. Romberg negative. Performs a tandem gait walk.    Gait: Normal stride, base and  armswing.     Tests for meningeal irritation: negative.    ........................................................................................................................................Marland Kitchen    VISIT SUMMARY:  Jennifer Lewis, a 56 y.o. Caucasian  female  presented  For the follow-up.  Jennifer Lewis is instructed to schedule a follow-up with   me in 3 months, or earlier, if needed.   Ms. Aarianna Hoadley Harn voiced a complete understanding of the diagnostic and treatment plan as detailed above. All questions were answered.   Start of Visit Time: 12:55h  End of Visit Time: 13:29h  Total visit time =  36 minutes    Greater than 50% of the face to face time was spent in consultation on the  disease process,  treatment planning, medication, dosing and side effects.     Thank you for the opportunity to contribute to the care of Ms. Labrisha D Bermingham.

## 2017-09-29 NOTE — Unmapped (Signed)
Deanna Buddy Duty, MD  Sarita Bottom, MD   Cc: Modena Slater, MD; Altamease Oiler, MD      ??      She should be on dual anti platelet therapy for 6 months and should have a repeat angiogram.     I will cc Drs. Solander and Ho to arrange.     DSA    Previous Messages      ----- Message -----   From: Sarita Bottom, MD   Sent: 09/27/2017 ??11:17 PM   To: Edison Simon, MD     Dear Dr. Tresa Garter,     I saw out mutual patient today. She has not scheduled a follow-up with neurosurgery, she is still taking both Aspirin and Plavix as suggested at ??her discharge from neurosurgery in [place] 2018.     I would appreciate to get your feedback for how long she should stay on dual antiplatelet therapy.     Thank you for your reply.     Best,     Desma Mcgregor [facility] Neurology MS Division

## 2017-10-08 DIAGNOSIS — R4189 Other symptoms and signs involving cognitive functions and awareness: Secondary | ICD-10-CM | POA: Insufficient documentation

## 2017-10-08 MED ORDER — DIMETHYL FUMARATE 240 MG CAPSULE,DELAYED RELEASE
ORAL_CAPSULE | Freq: Two times a day (BID) | ORAL | 2 refills | 0.00000 days | Status: CP
Start: 2017-10-08 — End: 2017-12-11

## 2017-10-08 MED ORDER — DIMETHYL FUMARATE 240 MG CAPSULE,DELAYED RELEASE: 240 mg | capsule | 2 refills | 0 days

## 2017-10-08 NOTE — Telephone Encounter (Signed)
Pt called about needing a refill for the medication. Please advise?  Call pt @ [phone]. Thank you!

## 2017-10-08 NOTE — Unmapped (Signed)
-----   Message -----   From: Edison Simon, MD   Sent: 09/28/2017 ??10:04 AM   To: Altamease Oiler, MD, *     She should be on dual anti platelet therapy for 6 months and should have a repeat angiogram.     I will cc Drs. Solander and Ho to arrange.     DSA   ----- Message -----   From: Sarita Bottom, MD   Sent: 09/27/2017 ??11:17 PM   To: Edison Simon, MD     Dear Dr. Tresa Garter,     I saw out mutual patient today. She has not scheduled a follow-up with neurosurgery, she is still taking both Aspirin and Plavix as suggested at ??her discharge from neurosurgery in [place] 2018.     I would appreciate to get your feedback for how long she should stay on dual antiplatelet therapy.     Thank you for your reply.     Best,     Desma Mcgregor [facility] Neurology MS Division

## 2017-10-09 MED ORDER — ESCITALOPRAM OXALATE 10 MG PO TABS: 10.0000 mg | ORAL_TABLET | Freq: Every day | ORAL | 1 refills | Status: DC

## 2017-10-09 NOTE — Addendum Note (Signed)
Addended by: Elise Benne T on: 10/09/2017 07:47 AM   Modules accepted: Orders

## 2017-10-12 NOTE — Unmapped (Signed)
[facility] Refill Coordination Note  Specialty Medication(s): Jennifer Lewis Balsam, DOB: 1961/01/07  Phone: [phone] (home) , Alternate phone contact: N/A  Phone or address changes today?: No  All above HIPAA information was verified with patient.  Shipping Address: [address]  [place] [address]   Insurance changes? No    Completed refill call assessment today to schedule patient's medication shipment from the [facility] [phone]).      Confirmed the medication and dosage are correct and have not changed: Yes, regimen is correct and unchanged.    Confirmed patient started or stopped the following medications in the past month:  No, there are no changes reported at this time.    Are you tolerating your medication?:  Jennifer Lewis reports tolerating the medication.    ADHERENCE    (Below is required for Medicare Part B or Transplant patients only - per drug):   How many tablets were dispensed last month: 60  Patient currently has AT LEAST 7 DAYS remaining.    Did you miss any doses in the past 4 weeks? No missed doses reported.    FINANCIAL/SHIPPING    Delivery Scheduled: Yes, Expected medication delivery date: 10/17/17     Jennifer Lewis did not have any additional questions at this time.    Delivery address validated in [facility] scheduling system: Yes, address listed in [facility] is correct.    We will follow up with patient monthly for standard refill processing and delivery.      Thank you,  Jennifer Lewis   [facility] Specialty Technician

## 2017-10-22 NOTE — Unmapped (Signed)
This encounter was created in error - please disregard. This encounter was created in error - please disregard. This encounter was created in error - please disregard. 

## 2017-10-27 NOTE — Unmapped (Signed)
Per patient's feedback, donepezil was discontinued.    Sharmon Revere Dujmovic Basuroski

## 2017-10-31 ENCOUNTER — Encounter: Payer: Self-pay | Admitting: Family

## 2017-10-31 ENCOUNTER — Ambulatory Visit (INDEPENDENT_AMBULATORY_CARE_PROVIDER_SITE_OTHER): Payer: Medicare Other | Admitting: Family

## 2017-10-31 VITALS — BP 120/86 | HR 106 | Temp 98.1°F | Ht 65.0 in | Wt 165.0 lb

## 2017-10-31 DIAGNOSIS — R109 Unspecified abdominal pain: Secondary | ICD-10-CM

## 2017-10-31 LAB — URINALYSIS, ROUTINE W REFLEX MICROSCOPIC
BILIRUBIN URINE: NEGATIVE
Ketones, ur: NEGATIVE
Leukocytes, UA: NEGATIVE
Nitrite: NEGATIVE
PH: 6.5 (ref 5.0–8.0)
RBC / HPF: NONE SEEN (ref 0–?)
TOTAL PROTEIN, URINE-UPE24: NEGATIVE
Urine Glucose: NEGATIVE
Urobilinogen, UA: 0.2 (ref 0.0–1.0)

## 2017-10-31 LAB — CK: Total CK: 49 U/L (ref 7–177)

## 2017-10-31 NOTE — Patient Instructions (Signed)
Urine, labs  Let me know if worsens in any way

## 2017-10-31 NOTE — Progress Notes (Signed)
Subjective:    Patient ID: Debra Hogan, female    DOB: September 20, 1961, 56 y.o.   MRN: [id number]  CC: Debra Hogan is a 56 y.o. female who presents today for an acute visit.    HPI: Left flank pain x 2 weeks, improving.   Pain worsened with movement. Worse when drinks 'glass of tea.' No fever, dysuria, hematuria, abdominal pain, nausea, epigastric burning, vomiting. Pain is not migratory. Tyleonol doesn't help; tramadol either.   Started donzepril 2 weeks ago to help with 'focus', taking intermittently and noticed 'hurting in my left kidney.' Since stopped the donzepril and has been drinking only fluids for 3 days, with pain improving.       HISTORY:  Past Medical History:  Diagnosis Date  . Arthritis   . COPD (chronic obstructive pulmonary disease) (HCC)   . Depression   . Fibromyalgia   . Irritable bowel syndrome   . MS (multiple sclerosis) (HCC)    Followed by Dr. Sherryll Burger   Past Surgical History:  Procedure Laterality Date  . ABDOMINAL HYSTERECTOMY    . FOOT SURGERY  2001  . LAPAROSCOPIC ENDOMETRIOSIS FULGURATION     Family History  Problem Relation Age of Onset  . Hypertension Mother   . Hyperlipidemia Mother   . Arthritis Mother   . Cancer Father        throat  . Heart disease Brother   . Bipolar disorder Son   . Schizophrenia Son   . Cancer Maternal Grandfather        liver  . Cancer Paternal Grandmother        liver    Allergies: Valium [diazepam] Current Outpatient Medications on File Prior to Visit  Medication Sig Dispense Refill  . acyclovir (ZOVIRAX) 400 MG tablet Take 1 tablet (400 mg total) by mouth daily. 90 tablet 3  . ALPRAZolam (XANAX) 0.25 MG tablet Take 1 tablet (0.25 mg total) by mouth daily as needed for anxiety. 30 tablet 0  . baclofen (LIORESAL) 10 MG tablet   4  . carbamazepine (CARBATROL) 100 MG 12 hr capsule   1  . clopidogrel (PLAVIX) 75 MG tablet Take 75 mg by mouth.    . diclofenac sodium (VOLTAREN) 1 % GEL Apply 2 g topically 4 (four)  times daily as needed (for pain).     Marland Kitchen escitalopram (LEXAPRO) 10 MG tablet Take 1 tablet (10 mg total) by mouth daily. 90 tablet 1  . Fluticasone-Salmeterol (ADVAIR DISKUS) 100-50 MCG/DOSE AEPB Inhale 1 puff into the lungs 2 (two) times daily as needed (for shortness of breath). 60 each 3  . sertraline (ZOLOFT) 50 MG tablet Take 1 tablet (50 mg total) by mouth at bedtime. 90 tablet 3  . traMADol (ULTRAM) 50 MG tablet Take 1-2 tablets (50-100 mg total) by mouth every 8 (eight) hours as needed. 60 tablet 0   No current facility-administered medications on file prior to visit.     Social History   Tobacco Use  . Smoking status: Current Every Day Smoker    Packs/day: 1.00    Last attempt to quit: 10/21/2008    Years since quitting: 9.0  . Smokeless tobacco: Never Used  Substance Use Topics  . Alcohol use: No    Alcohol/week: 0.0 oz  . Drug use: No    Review of Systems  Constitutional: Negative for chills and fever.  Respiratory: Negative for cough.   Cardiovascular: Negative for chest pain and palpitations.  Gastrointestinal: Negative for abdominal distention, abdominal pain,  constipation, diarrhea, nausea and vomiting.  Genitourinary: Positive for flank pain. Negative for difficulty urinating, dysuria, hematuria and menstrual problem.      Objective:    BP 120/86   Pulse (!) 106   Temp 98.1 F (36.7 C) (Oral)   Ht 5\' 5"  (1.651 m)   Wt 165 lb (74.8 kg)   SpO2 93%   BMI 27.46 kg/m    Physical Exam  Constitutional: She appears well-developed and well-nourished.  Eyes: Conjunctivae are normal.  Cardiovascular: Normal rate, regular rhythm, normal heart sounds and normal pulses.  Pulmonary/Chest: Effort normal and breath sounds normal. She has no wheezes. She has no rhonchi. She has no rales.  Abdominal: Soft. Normal appearance and bowel sounds are normal. She exhibits no distension, no fluid wave, no ascites and no mass. There is no tenderness. There is CVA tenderness  (left). There is no rigidity, no rebound and no guarding.  Neurological: She is alert.  Skin: Skin is warm and dry.  Psychiatric: She has a normal mood and affect. Her speech is normal and behavior is normal. Thought content normal.  Vitals reviewed.      Assessment & Plan:   1. Left flank pain Afebrile. Patient well-appearing. Positive left CVA tenderness. Differentials include renal stone versus muscle skeletal etiology as worsened with movement. Patient was recently started on donzepril which carries rare association with rhabdomyolysis. She has stopped this medication. Pending creatinine kinase. Patient declined CT renal study at this time and she would like to see lab, urine studies first. Return precautions given  - POCT urinalysis dipstick - Urinalysis, Routine w reflex microscopic - CULTURE, URINE COMPREHENSIVE - CK (Creatine Kinase)    I am having Debra Hogan maintain her diclofenac sodium, ALPRAZolam, Fluticasone-Salmeterol, acyclovir, clopidogrel, baclofen, carbamazepine, traMADol, sertraline, and escitalopram.   No orders of the defined types were placed in this encounter.   Return precautions given.   Risks, benefits, and alternatives of the medications and treatment plan prescribed today were discussed, and patient expressed understanding.   Education regarding symptom management and diagnosis given to patient on AVS.  Continue to follow with Debra Hogan, Margaret G, FNP for routine health maintenance.   Iline D Slusher and I agreed with plan.   Rennie PlowmanMargaret Arnett, FNP

## 2017-11-02 ENCOUNTER — Other Ambulatory Visit: Payer: Self-pay | Admitting: Family

## 2017-11-02 DIAGNOSIS — R300 Dysuria: Secondary | ICD-10-CM

## 2017-11-02 LAB — CULTURE, URINE COMPREHENSIVE
MICRO NUMBER: 81252832
SPECIMEN QUALITY: ADEQUATE

## 2017-11-05 ENCOUNTER — Other Ambulatory Visit (INDEPENDENT_AMBULATORY_CARE_PROVIDER_SITE_OTHER): Payer: Medicare Other

## 2017-11-05 ENCOUNTER — Telehealth: Payer: Self-pay | Admitting: Family

## 2017-11-05 DIAGNOSIS — F411 Generalized anxiety disorder: Secondary | ICD-10-CM

## 2017-11-05 DIAGNOSIS — R319 Hematuria, unspecified: Secondary | ICD-10-CM

## 2017-11-05 DIAGNOSIS — R300 Dysuria: Secondary | ICD-10-CM

## 2017-11-05 LAB — URINALYSIS, ROUTINE W REFLEX MICROSCOPIC
Ketones, ur: NEGATIVE
Leukocytes, UA: NEGATIVE
NITRITE: NEGATIVE
PH: 6.5 (ref 5.0–8.0)
RBC / HPF: NONE SEEN (ref 0–?)
Specific Gravity, Urine: 1.015 (ref 1.000–1.030)
TOTAL PROTEIN, URINE-UPE24: 100 — AB
Urine Glucose: NEGATIVE
Urobilinogen, UA: 0.2 (ref 0.0–1.0)

## 2017-11-05 MED ORDER — ESCITALOPRAM OXALATE 10 MG PO TABS: 10.0000 mg | ORAL_TABLET | Freq: Every day | ORAL | 1 refills | Status: DC

## 2017-11-05 NOTE — Telephone Encounter (Signed)
Pt wanted to let Arnett know she only has 1 refill left on her escitalopram (LEXAPRO) 10 MG tablet.

## 2017-11-05 NOTE — Telephone Encounter (Signed)
Medication has been refilled as per request. 

## 2017-11-06 LAB — URINE CULTURE
MICRO NUMBER: 81271729
RESULT: NO GROWTH
SPECIMEN QUALITY:: ADEQUATE

## 2017-11-08 NOTE — Unmapped (Addendum)
**  UPDATE 11/19 - patient called to say she spoke with grant and was told no grant funding is available at this time. I offered a charge account and for her to pay $10/month, but she is unable to do this. She wanted to cancel this fill for now. I am notifying CPP in neuro clinic and will reschedule call for ~1 month to check in with her/grant at that time.     -Jennifer Lewis @ [facility]    [facility] Refill Coordination Note  Specialty Medication(s): Jennifer Lewis, DOB: 1961-11-17  Phone: [phone] (home) , Alternate phone contact: N/A  Phone or address changes today?: No  All above HIPAA information was verified with patient.  Shipping Address: [address]  [place] [address]   Insurance changes? No    Completed refill call assessment today to schedule patient's medication shipment from the [facility] [phone]).      Confirmed the medication and dosage are correct and have not changed: Yes, regimen is correct and unchanged.    Confirmed patient started or stopped the following medications in the past month:  No, there are no changes reported at this time.    Are you tolerating your medication?:  Jennifer Lewis reports tolerating the medication.    ADHERENCE    (Below is required for Medicare Part B or Transplant patients only - per drug):   How many tablets were dispensed last month: 60  Patient currently has 9 DAYS remaining.    Did you miss any doses in the past 4 weeks? No missed doses reported.    FINANCIAL/SHIPPING    Delivery Scheduled: Yes, Expected medication delivery date: 11/13/17     Jennifer Lewis did not have any additional questions at this time.    Delivery address validated in [facility] scheduling system: Yes, address listed in [facility] is correct.    We will follow up with patient monthly for standard refill processing and delivery.      Thank you,  Jennifer Lewis   [facility] Specialty Technician

## 2017-11-13 NOTE — Unmapped (Signed)
Patient's grant for [employer] has been depleted. With her current prescription Medicare Part D, her copay is >$400. Will discuss with Jasmine from MAP team if patient would qualify for [employer] for help with copay. Sent signed rx to her today to send to [employer].    Worthy Flank, PharmD, CPP  Clinical Pharmacist, [facility]  Phone: [phone]

## 2017-11-19 NOTE — Unmapped (Signed)
Called patient to ensure she received Jasmine's phone message with the phone number to call [employer]. Mrs. Song confirms that she received the [employer] phone number and verbalized understanding that she will need to call for [employer] assistance since neither Jasmine nor I can do this on her behalf.    Worthy Flank, PharmD, CPP  Clinical Pharmacist, [facility]  Phone: [phone]

## 2017-11-21 NOTE — Unmapped (Signed)
Pre-op complete, instructed to take am meds.

## 2017-11-30 MED ORDER — CARBAMAZEPINE ER 200 MG CAPSULE,EXTENDED RELEASE MPHASE12HR
ORAL_CAPSULE | 2 refills | 0 days | Status: CP
Start: 2017-11-30 — End: 2018-01-02

## 2017-12-11 MED ORDER — DIMETHYL FUMARATE 240 MG CAPSULE,DELAYED RELEASE
ORAL_CAPSULE | Freq: Two times a day (BID) | ORAL | 2 refills | 0 days | Status: CP
Start: 2017-12-11 — End: 2018-01-06

## 2017-12-26 NOTE — Unmapped (Signed)
Pre-op complete, instructed to take am meds.

## 2017-12-28 ENCOUNTER — Encounter: Admit: 2017-12-28 | Discharge: 2017-12-28 | Payer: MEDICARE

## 2017-12-28 DIAGNOSIS — I671 Cerebral aneurysm, nonruptured: Principal | ICD-10-CM

## 2017-12-28 NOTE — Unmapped (Signed)
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 Arteriovenous shunting from right posterior cerebral  artery. Please see finalized report in PACS    Attending physician:  Eston Esters    Resident physician:   Ho    Complications:   NONE    Blood loss:  10 mL    Sheath:  removed    Method of hemostasis:  MynxGrip vascular closure device

## 2017-12-28 NOTE — Unmapped (Signed)
HPI: Jennifer Lewis is a 57 y.o. female who presents for diagnostic cerebral angiogram.    Allergies:   Allergies   Allergen Reactions   ??? Diazepam Anxiety       Medications:   Prescriptions Prior to Admission   Medication Sig Dispense Refill Last Dose   ??? acyclovir (ZOVIRAX) 400 MG tablet Take by mouth daily.    12/27/2017 at Unknown time   ??? aspirin 81 MG chewable tablet Chew 1 tablet (81 mg total) daily. 30 tablet 0 12/27/2017 at Unknown time   ??? baclofen (LIORESAL) 10 MG tablet Take 1 tablet (10 mg total) by mouth Three (3) times a day as needed for muscle spasms. 30 tablet 5 Past Week at Unknown time   ??? carBAMazepine (CARBATROL) 200 MG 12 hr capsule TAKE 1 CAPSULE BY MOUTH TWICE DAILY 60 capsule 2 12/27/2017 at Unknown time   ??? cholecalciferol, vitamin D3, (VITAMIN D3) 1,000 unit capsule Take 1,000 Units by mouth daily.   12/27/2017 at Unknown time   ??? clopidogrel (PLAVIX) 75 mg tablet Take 1 tablet (75 mg total) by mouth daily. Start taking one week prior to planned procedure. 90 tablet 3 12/27/2017 at Unknown time   ??? dimethyl fumarate (TECFIDERA) 240 mg CpDR Take 1 capsule (240 mg total) by mouth Two (2) times a day with meals. 60 capsule 2 12/27/2017 at Unknown time   ??? escitalopram oxalate (LEXAPRO) 10 MG tablet Take 10 mg by mouth.   12/27/2017 at Unknown time   ??? fluticasone-salmeterol (ADVAIR) 100-50 mcg/dose diskus Inhale 1 puff Two (2) times a day.   12/27/2017 at Unknown time   ??? ondansetron (ZOFRAN-ODT) 4 MG disintegrating tablet Take 4 mg by mouth every six (6) hours as needed for nausea.   Past Week at Unknown time       Physical Exam:  Vitals:    12/28/17 0915   BP: 141/78   Pulse: 91   Resp: 17   Temp: 36.4 ??C (97.5 ??F)   SpO2: 93%     ASA Grade: ASA 2 - Patient with mild systemic disease with no functional limitations  Airway assessment: Class 3 - Can visualize soft palate  Lungs: Respirations nonlabored    Lab Results   Component Value Date    PLT 207 09/27/2017       Assessment/Plan:    Jennifer Lewis is a 57 y.o. female who will undergo diagnostic cerebral angiogram in Neurointerventional Radiology.    -This procedure has been fully reviewed with the patient/patient???s authorized representative. The risks, benefits and alternatives have been explained, and the patient/patient???s authorized representative has consented to the procedure.  -The patient accepts blood products in an emergent situation.        Modena Slater, MD  [facility] Fellow

## 2017-12-28 NOTE — Unmapped (Signed)
Pt awake, VSS. Pt ambulated to restroom without issues. R groin dressing remains CDI. Dr. Eston Esters at bedside to speak with pt. D/c teaching reviewed with pt and her husband, verbalized understanding of teaching. PIV removed. Pt taken to lobby in wheelchair by RN.

## 2018-01-02 ENCOUNTER — Encounter: Admit: 2018-01-02 | Discharge: 2018-01-03 | Payer: MEDICARE | Attending: Neurology | Primary: Neurology

## 2018-01-02 DIAGNOSIS — G35 Multiple sclerosis: Principal | ICD-10-CM

## 2018-01-02 DIAGNOSIS — M792 Neuralgia and neuritis, unspecified: Secondary | ICD-10-CM

## 2018-01-02 DIAGNOSIS — F419 Anxiety disorder, unspecified: Secondary | ICD-10-CM

## 2018-01-02 DIAGNOSIS — F329 Major depressive disorder, single episode, unspecified: Secondary | ICD-10-CM

## 2018-01-02 DIAGNOSIS — Z23 Encounter for immunization: Secondary | ICD-10-CM

## 2018-01-02 DIAGNOSIS — I671 Cerebral aneurysm, nonruptured: Secondary | ICD-10-CM

## 2018-01-02 LAB — CBC W/ AUTO DIFF
BASOPHILS ABSOLUTE COUNT: 0 10*9/L (ref 0.0–0.1)
EOSINOPHILS ABSOLUTE COUNT: 0.1 10*9/L (ref 0.0–0.4)
HEMATOCRIT: 44.4 % (ref 36.0–46.0)
HEMOGLOBIN: 15.4 g/dL (ref 12.0–16.0)
LARGE UNSTAINED CELLS: 2 % (ref 0–4)
LYMPHOCYTES ABSOLUTE COUNT: 0.5 10*9/L — ABNORMAL LOW (ref 1.5–5.0)
MEAN CORPUSCULAR HEMOGLOBIN: 32.3 pg (ref 26.0–34.0)
MEAN CORPUSCULAR VOLUME: 93.1 fL (ref 80.0–100.0)
MEAN PLATELET VOLUME: 7.2 fL (ref 7.0–10.0)
NEUTROPHILS ABSOLUTE COUNT: 4 10*9/L (ref 2.0–7.5)
PLATELET COUNT: 201 10*9/L (ref 150–440)
RED CELL DISTRIBUTION WIDTH: 12.6 % (ref 12.0–15.0)
WBC ADJUSTED: 5.1 10*9/L (ref 4.5–11.0)

## 2018-01-02 LAB — NEUTROPHILS ABSOLUTE COUNT: Lab: 4

## 2018-01-02 LAB — PROTEIN TOTAL: Protein:MCnc:Pt:Ser/Plas:Qn:: 7.7

## 2018-01-02 LAB — HEPATIC FUNCTION PANEL
ALBUMIN: 4.5 g/dL (ref 3.5–5.0)
AST (SGOT): 33 U/L (ref 14–38)
BILIRUBIN DIRECT: 0.5 mg/dL — ABNORMAL HIGH (ref 0.00–0.40)
BILIRUBIN TOTAL: 0.7 mg/dL (ref 0.0–1.2)
PROTEIN TOTAL: 7.7 g/dL (ref 6.5–8.3)

## 2018-01-02 MED ORDER — CARBAMAZEPINE ER 200 MG CAPSULE,EXTENDED RELEASE MPHASE12HR
ORAL_CAPSULE | Freq: Three times a day (TID) | ORAL | 1 refills | 0 days | Status: SS
Start: 2018-01-02 — End: 2018-07-09

## 2018-01-02 MED ORDER — BACLOFEN 10 MG TABLET
ORAL_TABLET | Freq: Three times a day (TID) | ORAL | 5 refills | 0.00000 days | Status: CP | PRN
Start: 2018-01-02 — End: 2019-06-22

## 2018-01-02 NOTE — Unmapped (Signed)
[facility]  Multiple Sclerosis/Neuroimmunology Division  Sarita Bottom, MD  Associate Professor of Neurology    DATE OF VISIT: 01/02/18    Re:  Jennifer Lewis  [address]  [place] [address]  MRN: [id number]  DOB: 1961-03-04      Direct entry by: Dr. Desma Mcgregor [facility]    Visit:Follow-up Visit      REASON FOR VISIT: [name], a 57 y.o. Caucasian  female, is seen in consultation at the [facility], Multiple Sclerosis/Neuroimmunology Division for the follow-up of MS. [name] was last time seen on 09/27/2017.       Assessment:     1. I took a detailed history of the present illness fromMs. Jennifer Lewis , details on past medical history, family history and social history.  2. I personally reviewed  patient's prior medical records, radiology reports, and laboratory work.    3. I personally reviewed the patient???s prior MRI images  and have discussed MRI findings with the patient.   4.  I performed neurological examination.  5.  [name] agreed with the recommended diagnostic and treatment plan after we discussed them.                                                                                                                                    ?? Multiple sclerosis:  [name], is a 57 y.o. Caucasian  female, who has been diagnosed with RRMS. She was on Tecfidera since 2015 and stopped in Nov/2017 due to financial issues. She re-started Tecfidera in [place] 2018 and tolerates it well. Per pharmacy note she was on/off Tecfidera. Her grant ran out in Nov 2018. She was given instructions on how to apply for free drug through manufacturer assistance since we are unable to do this for her. She called them in December and the next step is for her to apply for Medicare low income subsidy (LIS). She has to provide a denial letter for LIS to [employer] to be eligible for assistance through PAP. [employer] provided her a two week supply of Tecfidera while she is completing this process.   Please see the detailed plan below.   Follow-up me is planned  in 6 months, or earlier, if needed.     ?? Intracranial aneurysms:  Follow-up with neurosurgery. Aneurysms could contribute to headaches.     ?? Hypertension:  Follow-up with PCP.       Plan:     1. CBC/diff, LFTs  2. Flu shot today  3. start taking Vitamin D 2000 IU/day during the winter season, after that [place] go back to 1000 IU/day.  4. Follow-up with neurosurgery   5.  Continue with Tecfidera 240 mg BID.   6.  Increase Carbamazepine to 200 mg three times a day: for  paroxysmal sensory MS symptoms  7.  Continue with Baclofen 10 mg three times a day  8. Brain, C/T spine MRI w/o contrast to minimize contrast accumulation.   9. Follow-up with me in 6 months, or earlier, if needed.         Subjective:       HISTORY OF PRESENT ILLNESS:   [name], is a 57 y.o. Caucasian  female, who has been diagnosed with RRMS. She was treated since 2011 (per notes) for fibromyalgia. Her fist MS symptom seems to be pain in her legs in summer 2015.  MRI was done that showed lesions. Has not received steroids. Was on Lyrica that stopped in 2016 because of financial reasons. Had weight gain with Lyrica, too. She was on Tecfidera since 2015 and stopped in Nov/2017 due to financial issues. She tolerated Tecfidera well. Takes Baclofen PRN, for spasms, if she needs to walk for a quarter of a mile , she usees a cane, gets fatigued after about 200 feet. Fatigue is present since 2010.  Did not take any medications for fatigue.  Has bladder urgency, takes Ditropan that helps.  She is sometimes constipated. Has bilateral paroxysmal leg spasms when she lies down since 2011. She restarted with Tecfidera in [place] 2018, tolerates well. No flushings.    She is depressed.  Has frequent headaches: on the top of the head and on the back, temples, sometimes behind the eyes and below and above the eyes. The pain is stabbing. Typically does not get nautious with her headaches, but sometimes has nausea.  Has anxiety attacks when talking to people or sometimes when she is alone, too.   Takes Vitamin D 1000IU/day.  Has sharp pain in her feet. Has pain behind both eyes. Does not report new onset visual blurriness.     She states she is on disability since 2  years ago.  She can walk 1/4 mile without stopping.  She has been followed by neurosurgery for the endovascular treatment of intracranial aneurysm.    INTERVAL HISTORY: She is having occasional belt-like spasms around the belly, and shooting pain down the right or left,  sometimes down both legs. She denies other new symptoms.     MS DMD history:  Tecfidera: 2015 and stopped in Nov/2017 due to financial issues  Tecfidera: re-started [place] 2018-ongoing    Please see below the results of the diagnostic work-up performed so far.   ............................................................................................................................................Marland Kitchen  DIAGNOSTIC STUDIES / REVIEW OF RECORDS:    Prior medical records: [facility], [facility], [facility]    MRI:  05/29/2015: Brain MRI with and w/o contrast, report: Multiple white matter lesions in a distribution consistent with multiple sclerosis. No enhancing lesions.:   02/17/2017: Brain MRI with and w/o contrast, report:Stable burden of the disease with multiple white matter lesions as detailed above. No new enhancing lesions are visualized. There is a 8 mm bilobed aneurysm of the paraclinoid left internal carotid artery that has been present since 2015 comparison.  04/01/2017: MRA head with and w/o contrast: Unchanged appearance of bilateral ICA aneurysms as above. COMPARISON: MRI brain from 02/17/2017.  08/22/2017: Brain MRI with and w/o contrast and brain MRA report (COMPARISON: Brain MRI 02/17/2017. Brain MRA 04/01/2017. Cerebral angiogram 05/17/2017): -Interval left ICA pipeline stent placement. Evolving thrombus within the occluded left ophthalmic artery aneurysm. No evidence of flow within the aneurysm. -Unchanged right cavernous internal carotid artery aneurysm measuring 4 mm. -Numerous T2/flair hyperintensities within the subcortical and periventricular white  matter are consistent with the known history of multiple sclerosis. There is a single new lesion within the left periventricular white matter when compared to the most recent MRI 02/17/2017.      Lumbar puncture:   Per patient report, she did the spinal tap and the results were  the confirmation for MS     Blood tests:     Office Visit on 01/02/2018   Component Date Value Ref Range Status   ??? Albumin 01/02/2018 4.5  3.5 - 5.0 g/dL Final   ??? Total Protein 01/02/2018 7.7  6.5 - 8.3 g/dL Final   ??? Total Bilirubin 01/02/2018 0.7  0.0 - 1.2 mg/dL Final   ??? Bilirubin, Direct 01/02/2018 0.50* 0.00 - 0.40 mg/dL Final   ??? AST 16/09/9603 33  14 - 38 U/L Final   ??? ALT 01/02/2018 29  15 - 48 U/L Final   ??? Alkaline Phosphatase 01/02/2018 106  38 - 126 U/L Final   ??? WBC 01/02/2018 5.1  4.5 - 11.0 10*9/L Final   ??? RBC 01/02/2018 4.77  4.00 - 5.20 10*12/L Final   ??? HGB 01/02/2018 15.4  12.0 - 16.0 g/dL Final   ??? HCT 54/08/8118 44.4  36.0 - 46.0 % Final   ??? MCV 01/02/2018 93.1  80.0 - 100.0 fL Final   ??? MCH 01/02/2018 32.3  26.0 - 34.0 pg Final   ??? MCHC 01/02/2018 34.6  31.0 - 37.0 g/dL Final   ??? RDW 14/78/2956 12.6  12.0 - 15.0 % Final   ??? MPV 01/02/2018 7.2  7.0 - 10.0 fL Final   ??? Platelet 01/02/2018 201  150 - 440 10*9/L Final   ??? Absolute Neutrophils 01/02/2018 4.0  2.0 - 7.5 10*9/L Final   ??? Absolute Lymphocytes 01/02/2018 0.5* 1.5 - 5.0 10*9/L Final   ??? Absolute Monocytes 01/02/2018 0.3  0.2 - 0.8 10*9/L Final   ??? Absolute Eosinophils 01/02/2018 0.1  0.0 - 0.4 10*9/L Final   ??? Absolute Basophils 01/02/2018 0.0  0.0 - 0.1 10*9/L Final   ??? Large Unstained Cells 01/02/2018 2  0 - 4 % Final       Office Visit on 09/27/2017   Component Date Value Ref Range Status   ??? Albumin 09/27/2017 4.3  3.5 - 5.0 g/dL Final   ??? Total Protein 09/27/2017 7.0  6.5 - 8.3 g/dL Final   ??? Total Bilirubin 09/27/2017 0.4  0.0 - 1.2 mg/dL Final   ??? Bilirubin, Direct 09/27/2017 <0.10  0.00 - 0.40 mg/dL Final   ??? AST 21/30/8657 34  14 - 38 U/L Final   ??? ALT 09/27/2017 33  15 - 48 U/L Final   ??? Alkaline Phosphatase 09/27/2017 84  38 - 126 U/L Final   ??? WBC 09/27/2017 7.5  4.5 - 11.0 10*9/L Final   ??? RBC 09/27/2017 4.72  4.00 - 5.20 10*12/L Final   ??? HGB 09/27/2017 15.0  12.0 - 16.0 g/dL Final   ??? HCT 84/69/6295 44.4  36.0 - 46.0 % Final   ??? MCV 09/27/2017 94.0  80.0 - 100.0 fL Final   ??? MCH 09/27/2017 31.8  26.0 - 34.0 pg Final   ??? MCHC 09/27/2017 33.8  31.0 - 37.0 g/dL Final   ??? RDW 28/41/3244 13.0  12.0 - 15.0 % Final   ??? MPV 09/27/2017 9.1  7.0 - 10.0 fL Final   ??? Platelet 09/27/2017 207  150 - 440 10*9/L Final   ??? Neutrophil Left Shift 09/27/2017 1+* Not Present Final   ???  Absolute Neutrophils 09/27/2017 6.1  2.0 - 7.5 10*9/L Final   ??? Absolute Lymphocytes 09/27/2017 0.5* 1.5 - 5.0 10*9/L Final   ??? Absolute Monocytes 09/27/2017 0.5  0.2 - 0.8 10*9/L Final   ??? Absolute Eosinophils 09/27/2017 0.1  0.0 - 0.4 10*9/L Final   ??? Absolute Basophils 09/27/2017 0.0  0.0 - 0.1 10*9/L Final   ??? Large Unstained Cells 09/27/2017 2  0 - 4 % Final   Admission on 05/17/2017, Discharged on 05/18/2017   Component Date Value Ref Range Status   ??? Sodium 05/17/2017 144  135 - 145 mmol/L Final   ??? Potassium 05/17/2017 3.7  3.5 - 5.0 mmol/L Final   ??? Chloride 05/17/2017 109* 98 - 107 mmol/L Final   ??? CO2 05/17/2017 25.0  22.0 - 30.0 mmol/L Final   ??? BUN 05/17/2017 11  7 - 21 mg/dL Final   ??? Creatinine 05/17/2017 0.67  0.60 - 1.00 mg/dL Final   ??? BUN/Creatinine Ratio 05/17/2017 16   Final   ??? EGFR MDRD Non Af Amer 05/17/2017 >=60  >=60 mL/min/1.23m2 Final   ??? EGFR MDRD Af Amer 05/17/2017 >=60  >=60 mL/min/1.70m2 Final   ??? Anion Gap 05/17/2017 10  9 - 15 mmol/L Final   ??? Glucose 05/17/2017 124* 65 - 99 mg/dL Final   ??? Calcium 16/09/9603 8.1* 8.5 - 10.2 mg/dL Final   ??? Albumin 54/08/8118 3.4* 3.5 - 5.0 g/dL Final   ??? Total Protein 05/17/2017 6.0* 6.6 - 8.0 g/dL Final   ??? Total Bilirubin 05/17/2017 0.4  0.0 - 1.2 mg/dL Final   ??? AST 14/78/2956 40* 14 - 38 U/L Final   ??? ALT 05/17/2017 23  15 - 48 U/L Final   ??? Alkaline Phosphatase 05/17/2017 70  38 - 126 U/L Final   ??? PT 05/17/2017 13.3  10.3 - 13.3 sec Final   ??? INR 05/17/2017 1.14   Final   ??? APTT 05/17/2017 270.0* 27.7 - 37.7 sec Final   ??? Heparin Correlation 05/17/2017 1.2   Final   ??? Check Type 05/17/2017 Needed Type Check   Final   ??? ABO Grouping 05/17/2017 O NEG   Final   ??? Antibody Screen 05/17/2017 NEG   Final   ??? WBC 05/17/2017 6.2  4.5 - 11.0 10*9/L Final   ??? RBC 05/17/2017 4.15  4.00 - 5.20 10*12/L Final   ??? HGB 05/17/2017 13.1  12.0 - 16.0 g/dL Final   ??? HCT 21/30/8657 38.5  36.0 - 46.0 % Final   ??? MCV 05/17/2017 92.7  80.0 - 100.0 fL Final   ??? MCH 05/17/2017 31.6  26.0 - 34.0 pg Final   ??? MCHC 05/17/2017 34.0  31.0 - 37.0 g/dL Final   ??? RDW 84/69/6295 12.9  12.0 - 15.0 % Final   ??? MPV 05/17/2017 7.5  7.0 - 10.0 fL Final   ??? Platelet 05/17/2017 192  150 - 440 10*9/L Final   ??? Absolute Neutrophils 05/17/2017 4.7  2.0 - 7.5 10*9/L Final   ??? Absolute Lymphocytes 05/17/2017 1.0* 1.5 - 5.0 10*9/L Final   ??? Absolute Monocytes 05/17/2017 0.3  0.2 - 0.8 10*9/L Final   ??? Absolute Eosinophils 05/17/2017 0.1  0.0 - 0.4 10*9/L Final   ??? Absolute Basophils 05/17/2017 0.0  0.0 - 0.1 10*9/L Final   ??? Large Unstained Cells 05/17/2017 2  0 - 4 % Final   ??? Glucose, POC 05/17/2017 130  65 - 179 mg/dL Final   ??? Glucose, POC 05/17/2017 123  65 - 179 mg/dL Final   ??? Sodium 16/09/9603 142  135 - 145 mmol/L Final   ??? Potassium 05/18/2017 4.2  3.5 - 5.0 mmol/L Final   ??? Chloride 05/18/2017 107  98 - 107 mmol/L Final   ??? CO2 05/18/2017 28.0  22.0 - 30.0 mmol/L Final   ??? BUN 05/18/2017 12  7 - 21 mg/dL Final   ??? Creatinine 05/18/2017 0.73  0.60 - 1.00 mg/dL Final   ??? BUN/Creatinine Ratio 05/18/2017 16   Final   ??? EGFR MDRD Non Af Amer 05/18/2017 >=60  >=60 mL/min/1.34m2 Final   ??? EGFR MDRD Af Amer 05/18/2017 >=60  >=60 mL/min/1.45m2 Final   ??? Anion Gap 05/18/2017 7* 9 - 15 mmol/L Final   ??? Glucose 05/18/2017 87  65 - 179 mg/dL Final   ??? Calcium 54/08/8118 8.4* 8.5 - 10.2 mg/dL Final   ??? WBC 14/78/2956 8.3  4.5 - 11.0 10*9/L Final   ??? RBC 05/18/2017 3.87* 4.00 - 5.20 10*12/L Final   ??? HGB 05/18/2017 12.3  12.0 - 16.0 g/dL Final   ??? HCT 21/30/8657 36.1  36.0 - 46.0 % Final   ??? MCV 05/18/2017 93.2  80.0 - 100.0 fL Final   ??? MCH 05/18/2017 31.7  26.0 - 34.0 pg Final   ??? MCHC 05/18/2017 34.0  31.0 - 37.0 g/dL Final   ??? RDW 84/69/6295 12.7  12.0 - 15.0 % Final   ??? MPV 05/18/2017 7.9  7.0 - 10.0 fL Final   ??? Platelet 05/18/2017 205  150 - 440 10*9/L Final   ??? Magnesium 05/18/2017 1.9  1.6 - 2.2 mg/dL Final   ??? Phosphorus 05/18/2017 4.2  2.4 - 4.5 mg/dL Final   ??? Glucose, POC 05/18/2017 113  65 - 179 mg/dL Final   Office Visit on 05/08/2017   Component Date Value Ref Range Status   ??? Angio Convert Enzyme 05/08/2017 47  8 - 53 U/L Final   ??? Antinuclear Antibodies (ANA) 05/08/2017 Positive* Negative Final   ??? Pattern 1 (ANA) 05/08/2017 Speckled   Final   ??? ANA Titer 1 05/08/2017 1:80   Final   ??? Pattern 2 (ANA) 05/08/2017 Homogenous   Final   ??? ANA Titer 2 05/08/2017 1:80   Final   ??? Vitamin D Total (25OH) 05/08/2017 38.7  20.0 - 80.0 ng/mL Final   ??? CRP 05/08/2017 12.2* <10.0 mg/L Final   ??? WBC 05/08/2017 6.5  4.5 - 11.0 10*9/L Final   ??? RBC 05/08/2017 4.69  4.00 - 5.20 10*12/L Final   ??? HGB 05/08/2017 14.7  12.0 - 16.0 g/dL Final   ??? HCT 28/41/3244 43.6  36.0 - 46.0 % Final   ??? MCV 05/08/2017 93.0  80.0 - 100.0 fL Final   ??? MCH 05/08/2017 31.3  26.0 - 34.0 pg Final   ??? MCHC 05/08/2017 33.6  31.0 - 37.0 g/dL Final   ??? RDW 12/27/7251 12.5  12.0 - 15.0 % Final   ??? MPV 05/08/2017 9.4  7.0 - 10.0 fL Final   ??? Platelet 05/08/2017 275  150 - 440 10*9/L Final   ??? Absolute Neutrophils 05/08/2017 4.7  2.0 - 7.5 10*9/L Final   ??? Absolute Lymphocytes 05/08/2017 0.9* 1.5 - 5.0 10*9/L Final   ??? Absolute Monocytes 05/08/2017 0.6  0.2 - 0.8 10*9/L Final   ??? Absolute Eosinophils 05/08/2017 0.1  0.0 - 0.4 10*9/L Final   ??? Absolute Basophils 05/08/2017 0.0  0.0 - 0.1 10*9/L Final   ??? Large Unstained  Cells 05/08/2017 3  0 - 4 % Final     05/10/15: RF, vitamin B12; WNL.    06/26/16: TSH 5.96 (ref. 0.600 - 3.300 uIU/mL).       Other:   04/27/2017: seen Dr Judie Petit. Wang for the evaluation of cerebral aneurysms.  05/01/2017: Cerebral angiogram: 1. Superiorly projecting 7 x 6 mm left ophthalmic aneurysm. 2. Inferiorly projecting 5 x 3 right posterior communicating aneurysm. 3. 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  12/28/2017: IR CEREBRAL ARTERIOGRAM: Left internal carotid artery injection demonstrates good opacification of the left middle and anterior cerebral arteries. The pipeline treated left ophthalmic aneurysm is completely obliterated. No stenosis within the pipeline treated segment of the left internal carotid artery.Right internal carotid artery injection demonstrates an unchanged 4 mm aneurysm arising from the communicating segment of the right internal carotid artery.Left vertebral artery injection demonstrates unchanged arteriovenous shunting adjacent to the right side of the tentorium with arterial supply from the right posterior cerebral artery and venous drainage to the right transverse sinus.    .............................................................................................................................................    Past Medical History:  Past Medical History:   Diagnosis Date   ??? Aneurysm (CMS-HCC)    ??? Anxiety    ??? COPD (chronic obstructive pulmonary disease) (CMS-HCC)    ??? Depression    ??? Fibromyalgia    ??? Fibromyalgia, primary 2011   ??? Headache, tension-type 2011    Worse and more frequent in the past 4-5 months.   ??? MS (multiple sclerosis) (CMS-HCC)    ??? Restless leg syndrome    ??? Sleep apnea, obstructive 2016    Sleep study done.  Used a CPAP.   ??? Stroke (CMS-HCC) 2005         ALLERGIES:     Allergen Reactions   ??? Diazepam Anxiety     Current Outpatient Prescriptions   Medication Sig Dispense Refill   ??? acyclovir (ZOVIRAX) 400 MG tablet Take by mouth daily.      ??? aspirin 81 MG chewable tablet Chew 1 tablet (81 mg total) daily. 30 tablet 0   ??? baclofen (LIORESAL) 10 MG tablet Take 1 tablet (10 mg total) by mouth Three (3) times a day as needed for muscle spasms. 30 tablet 5   ??? carBAMazepine (CARBATROL) 200 MG 12 hr capsule Take 1 capsule (200 mg total) by mouth three (3) times a day. 270 capsule 1   ??? cholecalciferol, vitamin D3, (VITAMIN D3) 1,000 unit capsule Take 1,000 Units by mouth daily.     ??? dimethyl fumarate (TECFIDERA) 240 mg CpDR Take 1 capsule (240 mg total) by mouth Two (2) times a day with meals. 60 capsule 2   ??? escitalopram oxalate (LEXAPRO) 10 MG tablet Take 10 mg by mouth.     ??? fluticasone-salmeterol (ADVAIR) 100-50 mcg/dose diskus Inhale 1 puff Two (2) times a day.     ??? ondansetron (ZOFRAN-ODT) 4 MG disintegrating tablet Take 4 mg by mouth every six (6) hours as needed for nausea.       No current facility-administered medications for this visit.      Past Surgical History:   Procedure Laterality Date   ??? CHOLECYSTECTOMY     ??? colonoscopy     ??? HYSTERECTOMY     ??? IR EMBOLIZATION INTRACRANIAL SPINAL  05/17/2017    IR EMBOLIZATION INTRACRANIAL SPINAL 05/17/2017 Altamease Oiler, MD IMG VIR [facility]         Social History:    Social History  Social History   ??? Marital status: Married     Spouse name: N/A   ??? Number of children: N/A   ??? Years of education: N/A     Social History Main Topics   ??? Smoking status: Current Every Day Smoker     Packs/day: 1.00     Years: 36.00     Types: Cigarettes   ??? Smokeless tobacco: Never Used   ??? Alcohol use No   ??? Drug use: No   ??? Sexual activity: Yes     Partners: Male      Comment: Not on a regular basis, due to health issues. Other Topics Concern   ??? None     Social History Narrative   ??? None       Family History:     Problem Relation Age of Onset   ??? Cancer Father    ??? Hypertension Brother    ??? Heart disease Brother    ??? Multiple sclerosis Neg Hx    ??? Aneurysm Neg Hx         Review of Systems:  A 10-systems review was performed and, unless otherwise noted, declared negative by patient.    Objective:     Physical Exam:  Blood pressure 139/82, pulse 92, height 165.1 cm (5' 5), weight 71.7 kg (158 lb).   General Appearance: in no acute distress. Normal skin color, afebrile.  Heart and lungs: Regular heart rate. Eupneic, normal respiratory rate. Abdomen: Soft. No peripheral edema, peripheral pulses palpable.        NEUROLOGICAL EXAMINATION:     General:  Alert and oriented to person, place, time and situation.    Recent and remote memory intact.    Attention span and concentration normal.    Language and spontaneous speech normal, no dysarthria or aphasia.  Naming/fluency/repetition intact.  Fund of knowledge normal.  Following lateralizing commands across midline .      Cranial Nerves:     II, III- Pupils are equal and reactive to light b/l (direct and consensual reactions). Visual Acuity: with glasses: L 20/70-2, R 20/50-3. No visual field defect.   III, IV, VI- extra ocular movements are intact, No ptosis, no diplopia, no nystagmus.  V- sensation of the face intact b/l.   VII- face symmetrical, no facial droop, normal facial movements with smile/grimace  VIII- Hearing grossly intact. Weber test: sound is symmetrical with no lateralization.  IX and X- symmetric palate contraction, normal gag bilaterally, no dysarthria, no dysphagia.  XI- Full shoulder shrug bilaterally; no wasting, normal tone and strength of sternocleidomastoid muscles bilaterally.  XII- No tongue atrophy, no tongue fasciculations; tongue protrudes midline, full range of movements of the tongue.    Neck flexion normal.    Motor Exam:     Normal bulk. Fasciculations not observed.     Muscle strength:    Muscles UEs  LEs    R L  R L   Deltoids 5/5 5/5 Hip flexors  5/5 5/5   Biceps 5/5 5/5 Hip extensors 5/5 5/5   Triceps 5/5 5/5 Knee flexors 5/5 5/5   Hand grip 5/5 5/5 Knee extensors 5/5 5/5   Wrist flexors 5/5 5/5 Foot dorsal flexors 5/5 5/5   Wrist extensors 5/5 5/5 Foot plantar flexors 5/5 5/5   Finger flexors 5/5 5/5      Finger extensors 5/5 5/5           Reflexes R L   Biceps +3 +3   Brachioradialis  +  3 +3   Triceps +3 +3   Patella +3 +3   Achilles +2 +2     Normal tone b/l. Negative Babinski sign bilaterally.    Sensory system:  ? Superficial light touch sensation: WNL  ? Vibration sense: decerased in all 4 limbs  ? Position sense: WNL  ? Pinprick test for pain sensation: WNL  (WNL= within normal limits; UE= upper extremities; LE= lower extremities; R= right, L= left).    Cerebellar/Coordination:  Rapid alternating movements, finger-to-nose and heel-to-shin  bilaterally demonstrate no abnormalities. No limb ataxia bilaterally. No gait ataxia. Romberg negative. Performs a tandem gait walk.    Gait: Normal stride, base and  armswing.     Tests for meningeal irritation: negative.    ........................................................................................................................................Marland Kitchen    VISIT SUMMARY:  [name], a 57 y.o. Caucasian  female  presented  For the follow-up.  [name] is instructed to schedule a follow-up with   me in 6 months, or earlier, if needed.   [name] voiced a complete understanding of the diagnostic and treatment plan as detailed above. All questions were answered.   Start of Visit Time: 15:17h  End of Visit Time: 15:48h  Total visit time = 31 minutes    Greater than 50% of the face to face time was spent in consultation on the  disease process,  treatment planning, medication, dosing and side effects.     Thank you for the opportunity to contribute to the care of Ms. Ariane D Ballester.

## 2018-01-02 NOTE — Unmapped (Addendum)
1) please start taking Vitamin D 2000 IU/day during the winter season, after that you can go back to 1000 IU/day.  2) you need to see neurosurgery  Re the plan for the future blood thinner treatment plan.   3) Continue with tecfidera.   4) We increased Carbamazepine to 200 mg three times a day  5) Continue with Baclofen 10 mg three times a day      Please schedule with me in 6 months.      ?   In case of:  ? a suspected relapse (new symptoms or worsening existing symptoms, lasting for >24h)  OR  ? a need for an additional appointment for other reasons     Please contact:    [facility]   Phone: [phone]          Sarita Bottom, MD  Clinical Associate Professor of Neurology  [facility], Department of Neurology  Multiple Sclerosis/Neuroimmunology Division  [address] [place], [place], [place] [address]

## 2018-01-03 NOTE — Unmapped (Signed)
[facility] Refill Coordination Note  Specialty Medication(s): Jennifer Lewis, DOB: 26-Dec-1960  Phone: [phone] (home) , Alternate phone contact: N/A  Phone or address changes today?: No  All above HIPAA information was verified with patient.  Shipping Address: [address]  [place] [address]   Insurance changes? No    Completed refill call assessment today to schedule patient's medication shipment from the [facility] [phone]).      Confirmed the medication and dosage are correct and have not changed: Yes, regimen is correct and unchanged.    Confirmed patient started or stopped the following medications in the past month:  No, there are no changes reported at this time.    Are you tolerating your medication?:  Jennifer Lewis reports tolerating the medication.    ADHERENCE    (Below is required for Medicare Part B or Transplant patients only - per drug):   How many tablets were dispensed last month: 60  Patient currently has ABOUT A WEEK remaining.    Did you miss any doses in the past 4 weeks? No missed doses reported.    FINANCIAL/SHIPPING    Delivery Scheduled: Yes, Expected medication delivery date: 01/07/18     Jennifer Lewis did not have any additional questions at this time.    Delivery address validated in FSI scheduling system: Yes, address listed in FSI is correct.    We will follow up with patient monthly for standard refill processing and delivery.      Thank you,  Westley Gambles   [facility] Specialty Technician

## 2018-01-03 NOTE — Unmapped (Signed)
Per my communication with the MAPs team and documentation in referrals tab, patient has been on/off Tecfidera. Her grant ran out in Nov 2018. She was given instructions on how to apply for free drug through manufacturer assistance since we are unable to do this for her. She called them in December and the next step is for her to apply for Medicare low income subsidy (LIS). She has to provide a denial letter for LIS to [employer] to be eligible for assistance through PAP. [employer] provided her a two week supply of Tecfidera while she is completing this process.     Mrs. Wanamaker was made aware of the steps she needs to complete. Will try to apply for grant if open that would provide a few months while she applies for LIS.    Worthy Flank, PharmD, CPP  Clinical Pharmacist, [facility]  Phone: [phone]

## 2018-01-03 NOTE — Unmapped (Signed)
Patient also called back to advise she has her LIS meeting with [facility] at the [facility] on 01/10/18, at 9am. Patient will call us as soon as she has an update.

## 2018-01-03 NOTE — Unmapped (Signed)
Patient has been approved for financial assistance for Tecfidera from 12/04/17 to 12/03/18

## 2018-01-06 DIAGNOSIS — Z23 Encounter for immunization: Secondary | ICD-10-CM | POA: Insufficient documentation

## 2018-01-06 MED ORDER — DIMETHYL FUMARATE 240 MG CAPSULE,DELAYED RELEASE: 240 mg | capsule | 2 refills | 0 days

## 2018-01-06 MED ORDER — DIMETHYL FUMARATE 240 MG CAPSULE,DELAYED RELEASE
ORAL_CAPSULE | Freq: Two times a day (BID) | ORAL | 2 refills | 0 days | Status: SS
Start: 2018-01-06 — End: 2018-07-11

## 2018-01-10 NOTE — Unmapped (Signed)
Jennifer Lewis - pt just called.  Said that when she saw Dr Johnnye Lana recently she said that she was going to send in an prescription for something to help her with sleep.  She can't remember the name of the medication but thinks it began with an A.      She asks that the prescription be sent to her [facility] pharm as listed in her chart.      Pt can be reached at [phone] if any questions.     (This number not listed on her profile but she did not want it listed as she will be changing cell phone #s at some point.)  Thanks, Emmilee Reamer

## 2018-01-10 NOTE — Unmapped (Signed)
Can you please advise on the prescription for sleep?  Pharmacy is [facility].

## 2018-01-12 MED ORDER — ZOLPIDEM 5 MG TABLET
ORAL_TABLET | Freq: Every evening | ORAL | 0 refills | 0 days | Status: SS | PRN
Start: 2018-01-12 — End: 2018-07-09

## 2018-01-12 NOTE — Unmapped (Signed)
The patient requested a sleeping pill.   Rx Ambien.      Jennifer Lewis

## 2018-01-27 ENCOUNTER — Encounter: Admit: 2018-01-27 | Discharge: 2018-01-27 | Payer: MEDICARE

## 2018-01-27 DIAGNOSIS — G35 Multiple sclerosis: Principal | ICD-10-CM

## 2018-01-30 NOTE — Unmapped (Signed)
[facility] Refill Coordination Note  Specialty Medication(s): Jennifer Lewis, DOB: 1961-11-30  Phone: [phone] (home) , Alternate phone contact: N/A  Phone or address changes today?: No  All above HIPAA information was verified with patient.  Shipping Address: [address]  [place] [address]   Insurance changes? No    Completed refill call assessment today to schedule patient's medication shipment from the [facility] [phone]).      Confirmed the medication and dosage are correct and have not changed: Yes, regimen is correct and unchanged.    Confirmed patient started or stopped the following medications in the past month:  No, there are no changes reported at this time.    Are you tolerating your medication?:  Jennifer Lewis reports tolerating the medication.    ADHERENCE    (Below is required for Medicare Part B or Transplant patients only - per drug):   How many tablets were dispensed last month: 60  Patient currently has ABOUT 10 DAYS remaining.    Did you miss any doses in the past 4 weeks? No missed doses reported.    FINANCIAL/SHIPPING    Delivery Scheduled: Yes, Expected medication delivery date: 02/05/18     Jennifer Lewis did not have any additional questions at this time.    Delivery address validated in FSI scheduling system: Yes, address listed in FSI is correct.    We will follow up with patient monthly for standard refill processing and delivery.      Thank you,  Westley Gambles   [facility] Specialty Technician

## 2018-02-01 ENCOUNTER — Encounter
Admit: 2018-02-01 | Discharge: 2018-02-02 | Payer: MEDICARE | Attending: Neurological Surgery | Primary: Neurological Surgery

## 2018-02-01 DIAGNOSIS — I671 Cerebral aneurysm, nonruptured: Principal | ICD-10-CM

## 2018-02-01 NOTE — Unmapped (Signed)
I saw and evaluated the patient, participating in the key portions of the service. I reviewed the resident???s note. I agree with the resident???s findings and plan. Michelene Gardener, MD      BRIEF HISTORY:      Jennifer Lewis is a 57 y.o. 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    I discussed with her that her treated left ophthalmic aneurysm appears stable . We discussed the presence of her fistula, and that it Penrod be possible to treat this endovascularly. Her right sided aneurysm is small and stable enough that we can continue to monitor this with imaging. No further intervention is indicated at this time.     She does suffer from headaches often. However, I explained that the type of headache that would indicate an aneurysmal rupture would be unlike any of her daily headaches. This would be a severe sudden onset headache. She was instructed to present to the ED if this were to occur.     She also notes that she suffers from brain fog when she is tired, upset, or annoyed. She is very pleased with the results of her procedure with Dr. Eston Esters.     All questions were answered.    PLAN:  She should continue to take aspirin. I will contact Dr. Eston Esters to determine whether she should continue to take Plavix. She will follow up with him for an angiogram in [place].     Scribe's Attestation: Su Monks, MD obtained and performed the history, physical exam and medical decision making elements that were  entered into the chart. Documentation assistance was provided by me personally, a scribe. Signed by Fernand Parkins, Scribe, on February 01, 2018 at 3:10 PM.         I agree with the above documentation.  Su Monks, MD  Associate Professor  Department of Neurosurgery  [facility]

## 2018-02-01 NOTE — Unmapped (Addendum)
You were seen today in neurosurgery clinic for follow up of your aneurysms. If you have sudden onset of severe headaches please present to the ED for evaluation. Please continue to take aspirin. We will contact Dr. Eston Esters to arrange another angiogram in [place] and determine if you can discontinue Plavix.   _____________________________    Thank you for coming to see me today.  The best way to contact me is to establish your [facility] patient portal.  This is called [facility].  The information on how to set up an account is on your after visit summary.    Communication to the patient portal comes directly to Dr. Tresa Garter or her team, and is attached to your medical record.  The communications are secure, and designed to protect the privacy of your information.  We will ask that you use this for communication of important medical information between you and your medical team.  Obviously if you have an urgent or emergent issue, it is important to call or contact us immediately rather than relying on patient portal communications. The phone number for Dr. Tresa Garter' nurse, Yisroel Ramming, is [phone].    Su Monks, MD  Department of Neurosurgery  [facility]

## 2018-02-01 NOTE — Unmapped (Signed)
NEUROSURGERY CLINIC NOTE      Assessment and Recommendations  Jennifer Lewis is a 57 y.o. female s/p pipeline embolization of left opthalmic aneurysm with known right 4 mm pcom aneurysm and right tentorial dAVF.  Discussed the risks, indications, and alternatives to surgical intervention for the pcom aneurysm and dAVF.  At this time patient would like to continue with conservative management.     -will repeat angiogram in [place] '19  -will discuss with Dr. Eston Esters if patient able to stop Plavix    Encounter diagnoses  No diagnosis found.    Interval history  Jennifer Lewis is a 56 y.o. female with PMH of MS presents to neurosurgery clinic to discuss angiogram results in January s/p pipeline embolization of left opthalmic artery aneurysm on 05/17/2017, also with a 4 mm right communicating segment ICA aneurysm.  Patient with mild daily headachces.  Denies [facility]. Continues to smoke    Review of Systems  A 10-system review of systems was conducted and was negative except as documented above in the HPI.    Physical Exam  Vital Signs:   BP 144/78  - Pulse 107  - Temp 37.6 ??C (99.6 ??F)  - Ht 165.1 cm (5' 5)  - Wt 72.8 kg (160 lb 8 oz)  - BMI 26.71 kg/m??   General: No acute distress; Body mass index is 26.71 kg/m??.  Cardiovascular: Warm and well perfused with good pulses, no edema  Pulmonary: Unlabored breathing, no pursed lips or wheezing, acyanotic  Abdomen: Soft, nontender, nondistended  Skin: No petechiae, rashes or ecchymoses   Eyes: without scleral icterus    Neurological Exam:  Oriented to time, place, and person.    2nd Cranial Nerve:  Full visual fields to confrontation. 3rd, 4th, 6th Cranial Nerves:  Pupils equal, round, reactive to light; full extraocular movements. 5th Cranial Nerve: normal facial sensation. 7th Cranial Nerve: Full and symmetrical facial movement. 8th Cranial Nerve: Hears finger rub well bilaterally. 9th, 10th Cranial Nerves: Symmetrical palate 11th Cranial nerve: Normal trapezius and sternocleidomastoid. 12th Cranial nerve: Normal tongue movement.     Sensation: Intact to touch    Musculoskeletal: 5/5 strength in the UEs and LEs, no drift    Neurological imaging reviewed  Diagnostic angiogram: Absence of filling in the left opthalmic aneurysm s/p pipeline embolization device.  Left pcom 4 mm aneurysm. Type 1 dAVF with PCA feeders draining into the transverse sinus      ---Historical data---    Allergies  Diazepam    Medications    Medications reviewed in Epic.    Past Medical History  Past Medical History:   Diagnosis Date   ??? Aneurysm (CMS-HCC)    ??? Anxiety    ??? COPD (chronic obstructive pulmonary disease) (CMS-HCC)    ??? Depression    ??? Fibromyalgia    ??? Fibromyalgia, primary 2011   ??? Headache, tension-type 2011    Worse and more frequent in the past 4-5 months.   ??? MS (multiple sclerosis) (CMS-HCC)    ??? Restless leg syndrome    ??? Sleep apnea, obstructive 2016    Sleep study done.  Used a CPAP.   ??? Stroke (CMS-HCC) 2005       Past Surgical History  Past Surgical History:   Procedure Laterality Date   ??? CHOLECYSTECTOMY     ??? colonoscopy     ??? HYSTERECTOMY     ??? IR EMBOLIZATION INTRACRANIAL SPINAL  05/17/2017    IR EMBOLIZATION INTRACRANIAL SPINAL 05/17/2017 [place]  Inda Merlin, MD IMG VIR H&[facility]       Family History  Family History   Problem Relation Age of Onset   ??? Cancer Father    ??? Hypertension Brother    ??? Heart disease Brother    ??? Cancer Maternal Grandfather    ??? Cancer Paternal Grandfather    ??? Cancer Paternal Uncle    ??? COPD Brother    ??? Heart attack Brother    ??? Stroke Mother    ??? Stroke Maternal Grandmother    ??? Thyroid disease Maternal Grandmother    ??? Multiple sclerosis Neg Hx    ??? Aneurysm Neg Hx        Social History  Social History   Substance Use Topics   ??? Smoking status: Current Every Day Smoker     Packs/day: 1.00     Years: 36.00     Types: Cigarettes   ??? Smokeless tobacco: Never Used   ??? Alcohol use No

## 2018-02-03 MED FILL — TECFIDERA/240MG/CPDR: TECFIDERA/240MG/CPDR | 30 days supply | Qty: 60 | Fill #1

## 2018-02-08 NOTE — Unmapped (Signed)
Hi Dr. Eston Esters,    Should the patient continue on Plavix? Thanks!

## 2018-03-04 NOTE — Unmapped (Signed)
[facility] Refill Coordination Note  Specialty Medication(s): TECFIDERA  Additional Medications shipped: Jennifer Lewis, DOB: 06-27-1961  Phone: [phone] (home) , Alternate phone contact: N/A  Phone or address changes today?: No  All above HIPAA information was verified with patient.  Shipping Address: [address]  [place] [address]   Insurance changes? No    Completed refill call assessment today to schedule patient's medication shipment from the [facility] [phone]).      Confirmed the medication and dosage are correct and have not changed: Yes, regimen is correct and unchanged.    Confirmed patient started or stopped the following medications in the past month:  No, there are no changes reported at this time.    Are you tolerating your medication?:  Jennifer Lewis reports tolerating the medication.    ADHERENCE    Did you miss any doses in the past 4 weeks? No missed doses reported.    FINANCIAL/SHIPPING    Delivery Scheduled: Yes, Expected medication delivery date: 03/13/18     The patient will receive an FSI print out for each medication shipped and additional FDA Medication Guides as required.  Patient education from [facility] or [name] also be included in the shipment    Jennifer Lewis did not have any additional questions at this time.    Delivery address validated in FSI scheduling system: Yes, address listed in FSI is correct.    We will follow up with patient monthly for standard refill processing and delivery.      Thank you,  Thad Ranger   [facility] Specialty Pharmacist     Patient is also concerned that now that husband is on disability she Pak no longer qualify for tecfidera grant. I gave her grant phone # from referral and put K note in FSI that if not $0 copay when process next week to call patient.

## 2018-03-12 MED FILL — TECFIDERA/240MG/CPDR: TECFIDERA/240MG/CPDR | 30 days supply | Qty: 60 | Fill #2

## 2018-03-25 ENCOUNTER — Ambulatory Visit (INDEPENDENT_AMBULATORY_CARE_PROVIDER_SITE_OTHER): Payer: Medicare Other

## 2018-03-25 ENCOUNTER — Other Ambulatory Visit: Payer: Self-pay

## 2018-03-25 VITALS — BP 130/70 | HR 74 | Temp 98.5°F | Resp 14 | Ht 64.0 in | Wt 160.0 lb

## 2018-03-25 DIAGNOSIS — Z0000 Encounter for general adult medical examination without abnormal findings: Secondary | ICD-10-CM

## 2018-03-25 DIAGNOSIS — F411 Generalized anxiety disorder: Secondary | ICD-10-CM

## 2018-03-25 DIAGNOSIS — M79601 Pain in right arm: Secondary | ICD-10-CM

## 2018-03-25 NOTE — Progress Notes (Addendum)
Subjective:   Debra Hogan is a 57 y.o. female who presents for Medicare Annual (Subsequent) preventive examination.  Review of Systems:  No ROS.  Medicare Wellness Visit. Additional risk factors are reflected in the social history.  Cardiac Risk Factors include: advanced age (>97men, >21 women)     Objective:     Vitals: BP 130/70 (BP Location: Left Arm, Patient Position: Sitting, Cuff Size: Normal)   Pulse 74   Temp 98.5 F (36.9 C) (Oral)   Resp 14   Ht 5\' 4"  (1.626 m)   Wt 160 lb (72.6 kg)   SpO2 95%   BMI 27.46 kg/m   Body mass index is 27.46 kg/m.  Advanced Directives 03/25/2018 01/04/2016 12/31/2015 12/31/2015  Does Patient Have a Medical Advance Directive? No No - No  Does patient want to make changes to medical advance directive? Yes (MAU/Ambulatory/Procedural Areas - Information given) - - -  Would patient like information on creating a medical advance directive? - No - patient declined information Yes - Educational materials given Yes - Transport planner given    Tobacco Social History   Tobacco Use  Smoking Status Current Every Day Smoker  . Packs/day: 1.00  . Last attempt to quit: 10/21/2008  . Years since quitting: 9.4  Smokeless Tobacco Never Used     Ready to quit: Not Answered Counseling given: Not Answered   Clinical Intake:  Pre-visit preparation completed: Yes  Pain : No/denies pain     Nutritional Status: BMI 25 -29 Overweight Diabetes: No  How often do you need to have someone help you when you read instructions, pamphlets, or other written materials from your doctor or pharmacy?: 1 - Never  Interpreter Needed?: No     Past Medical History:  Diagnosis Date  . Arthritis   . COPD (chronic obstructive pulmonary disease) (HCC)   . Depression   . Fibromyalgia   . Irritable bowel syndrome   . MS (multiple sclerosis) (HCC)    Followed by Dr. Sherryll Burger   Past Surgical History:  Procedure Laterality Date  . ABDOMINAL HYSTERECTOMY      . CHOLECYSTECTOMY N/A 01/03/2016   Procedure: LAPAROSCOPIC CHOLECYSTECTOMY WITH INTRAOPERATIVE CHOLANGIOGRAM;  Surgeon: Lattie Haw, MD;  Location: [facility] ORS;  Service: General;  Laterality: N/A;  . ENDOSCOPIC RETROGRADE CHOLANGIOPANCREATOGRAPHY (ERCP) WITH PROPOFOL N/A 01/04/2016   Procedure: ENDOSCOPIC RETROGRADE CHOLANGIOPANCREATOGRAPHY (ERCP) WITH PROPOFOL;  Surgeon: Midge Minium, MD;  Location: [facility] ENDOSCOPY;  Service: Endoscopy;  Laterality: N/A;  . FOOT SURGERY  2001  . LAPAROSCOPIC ENDOMETRIOSIS FULGURATION     Family History  Problem Relation Age of Onset  . Hypertension Mother   . Hyperlipidemia Mother   . Arthritis Mother   . Cancer Father        throat  . Heart disease Brother   . Bipolar disorder Son   . Schizophrenia Son   . Cancer Maternal Grandfather        liver  . Cancer Paternal Grandmother        liver   Social History   Socioeconomic History  . Marital status: Married    Spouse name: Not on file  . Number of children: Not on file  . Years of education: Not on file  . Highest education level: Not on file  Occupational History  . Not on file  Social Needs  . Financial resource strain: Not hard at all  . Food insecurity:    Worry: Never true    Inability: Never true  .  Transportation needs:    Medical: No    Non-medical: No  Tobacco Use  . Smoking status: Current Every Day Smoker    Packs/day: 1.00    Last attempt to quit: 10/21/2008    Years since quitting: 9.4  . Smokeless tobacco: Never Used  Substance and Sexual Activity  . Alcohol use: No    Alcohol/week: 0.0 oz  . Drug use: No  . Sexual activity: Not Currently  Lifestyle  . Physical activity:    Days per week: Not on file    Minutes per session: Not on file  . Stress: Not on file  Relationships  . Social connections:    Talks on phone: Not on file    Gets together: Not on file    Attends religious service: Not on file    Active member of club or organization: Not on file     Attends meetings of clubs or organizations: Not on file    Relationship status: Not on file  Other Topics Concern  . Not on file  Social History Narrative   Lives in [place] with son. Widowed x 2.      Married for 10 years.       On disability       Outpatient Encounter Medications as of 03/25/2018  Medication Sig  . acyclovir (ZOVIRAX) 400 MG tablet Take 1 tablet (400 mg total) by mouth daily.  Marland Kitchen ALPRAZolam (XANAX) 0.25 MG tablet Take 1 tablet (0.25 mg total) by mouth daily as needed for anxiety.  . baclofen (LIORESAL) 10 MG tablet   . carbamazepine (CARBATROL) 100 MG 12 hr capsule   . clopidogrel (PLAVIX) 75 MG tablet Take 75 mg by mouth.  . diclofenac sodium (VOLTAREN) 1 % GEL Apply 2 g topically 4 (four) times daily as needed (for pain).   Marland Kitchen escitalopram (LEXAPRO) 10 MG tablet Take 1 tablet (10 mg total) daily by mouth.  . Fluticasone-Salmeterol (ADVAIR DISKUS) 100-50 MCG/DOSE AEPB Inhale 1 puff into the lungs 2 (two) times daily as needed (for shortness of breath).  . sertraline (ZOLOFT) 50 MG tablet Take 1 tablet (50 mg total) by mouth at bedtime.  . traMADol (ULTRAM) 50 MG tablet Take 1-2 tablets (50-100 mg total) by mouth every 8 (eight) hours as needed.   No facility-administered encounter medications on file as of 03/25/2018.     Activities of Daily Living In your present state of health, do you have any difficulty performing the following activities: 03/25/2018  Hearing? N  Vision? N  Difficulty concentrating or making decisions? Y  Comment Difficulty concentrating   Walking or climbing stairs? Y  Comment Unsteady gait  Dressing or bathing? N  Doing errands, shopping? Y  Comment She does not drive. Husband assists.  Preparing Food and eating ? N  Using the Toilet? N  In the past six months, have you accidently leaked urine? Y  Comment Managed with a daily pad  Do you have problems with loss of bowel control? N  Managing your Medications? N  Managing your  Finances? N  Housekeeping or managing your Housekeeping? Y  Comment Husband assists  Some recent data might be hidden    Patient Care Team: Allegra Grana, FNP as PCP - General (Family Medicine)    Assessment:   This is a routine wellness examination for [name].  The goal of the wellness visit is to assist the patient how to close the gaps in care and create a preventative care plan for the  patient.   The roster of all physicians providing medical care to patient is listed in the Snapshot section of the chart.  Taking calcium VIT D as appropriate/Osteoporosis risk reviewed.    Safety issues reviewed; Smoke and carbon monoxide detectors in the home. No firearms in the home. Wears seatbelts when driving or riding with others. No violence in the home.  They do not have excessive sun exposure.  Discussed the need for sun protection: hats, long sleeves and the use of sunscreen if there is significant sun exposure.  Patient is alert, normal appearance, oriented to person/place/and time.  Correctly identified the president of the [place] and recalls of 3/3 words. Performs simple calculations and can read correct time from watch face. Displays appropriate judgement.  No new identified risk were noted.  No failures at ADL's or IADL's.  Ambulates with cane as needed.  BMI- discussed the importance of a healthy diet, water intake and the benefits of aerobic exercise. Educational material provided.   24 hour diet recall: Regular diet  Dental- she plans to schedule an appointment.  Eye- Visual acuity not assessed per patient preference since they have regular follow up with the ophthalmologist.  Wears corrective lenses.  Sleep patterns- Sleeps 4-6 hours at night.   Mammogram declined.    TDAP vaccine deferred per patient preference.  Follow up with insurance.  Educational material provided.  Patient Concerns: None at this time. Follow up with PCP as needed.  Exercise Activities  and Dietary recommendations Current Exercise Habits: The patient does not participate in regular exercise at present  Goals    . Healthy Lifestyle     Chair exercises.  Walk as tolerated. Stretch! Healthy diet Stay hydrated       Fall Risk Fall Risk  03/25/2018 07/16/2017 02/21/2017  Falls in the past year? No No Yes  Number falls in past yr: - - 2 or more  Risk Factor Category  - - High Fall Risk  Risk for fall due to : - - History of fall(s)   Depression Screen PHQ 2/9 Scores 03/25/2018 07/16/2017 02/21/2017 11/18/2012  PHQ - 2 Score 0 0 0 2  PHQ- 9 Score - - - 7     Cognitive Function MMSE - Mini Mental State Exam 03/25/2018  Orientation to time 5  Orientation to Place 5  Registration 3  Attention/ Calculation 5  Recall 3  Language- name 2 objects 2  Language- repeat 1  Language- follow 3 step command 3  Language- read & follow direction 1  Write a sentence 1  Copy design 1  Total score 30        Immunization History  Administered Date(s) Administered  . Influenza Split 10/07/2012, 10/25/2014  . Influenza Whole 10/09/2013  . Influenza,inj,Quad PF,6+ Mos 10/21/2015  . Pneumococcal Polysaccharide-23 01/04/2016  . Tdap 10/21/2004   Screening Tests Health Maintenance  Topic Date Due  . TETANUS/TDAP  10/21/2014  . MAMMOGRAM  05/23/2015  . PAP SMEAR  09/22/2016  . INFLUENZA VACCINE  07/25/2018  . COLONOSCOPY  09/24/2021  . Hepatitis C Screening  Completed  . HIV Screening  Completed      Plan:   End of life planning; Advanced aging; Advanced directives discussed.  No HCPOA/Living Will.  Additional information provided to help them start the conversation with family per physician verbal order.  Copy of HCPOA/Living Will requested upon completion. Time spent on this topic is 20 minutes.  I have personally reviewed and noted the following in the  patient's chart:   . Medical and social history . Use of alcohol, tobacco or illicit drugs  . Current medications and  supplements . Functional ability and status . Nutritional status . Physical activity . Advanced directives . List of other physicians . Hospitalizations, surgeries, and ER visits in previous 12 months . Vitals . Screenings to include cognitive, depression, and falls . Referrals and appointments  In addition, I have reviewed and discussed with patient certain preventive protocols, quality metrics, and best practice recommendations. A written personalized care plan for preventive services as well as general preventive health recommendations were provided to patient.     Ashok Pall, LPN  05/29/7902   Agree with plan. Rennie Plowman, NP

## 2018-03-25 NOTE — Patient Instructions (Addendum)
  Debra Hogan , Thank you for taking time to come for your Medicare Wellness Visit. I appreciate your ongoing commitment to your health goals. Please review the following plan we discussed and let me know if I can assist you in the future.   Follow up as needed.    Bring a copy of your Health Care Power of Attorney and/or Living Will to be scanned into chart once completed.  Have a great day!  These are the goals we discussed: Goals    . Healthy Lifestyle     Chair exercises.  Walk as tolerated. Stretch! Healthy diet Stay hydrated       This is a list of the screening recommended for you and due dates:  Health Maintenance  Topic Date Due  . Tetanus Vaccine  10/21/2014  . Mammogram  05/23/2015  . Pap Smear  09/22/2016  . Flu Shot  07/25/2018  . Colon Cancer Screening  09/24/2021  .  Hepatitis C: One time screening is recommended by Center for Disease Control  (CDC) for  adults born from [birth year] through 1967.   Completed  . HIV Screening  Completed

## 2018-03-27 MED ORDER — DICLOFENAC SODIUM 1 % TD GEL: 2.0000 g | Freq: Four times a day (QID) | TRANSDERMAL | 0 refills | Status: DC | PRN

## 2018-03-27 NOTE — Telephone Encounter (Signed)
Call pt  I would prefer to discuss xanax at OV which is coming up regarding xanax. I see she has gotten [place] and xanax,  From irena dujmovic.   I refilled voltaren  Gel

## 2018-03-29 ENCOUNTER — Encounter: Payer: Self-pay | Admitting: Family

## 2018-03-29 ENCOUNTER — Ambulatory Visit (INDEPENDENT_AMBULATORY_CARE_PROVIDER_SITE_OTHER): Payer: Medicare Other

## 2018-03-29 ENCOUNTER — Ambulatory Visit (INDEPENDENT_AMBULATORY_CARE_PROVIDER_SITE_OTHER): Payer: Medicare Other | Admitting: Family

## 2018-03-29 VITALS — BP 144/92 | HR 85 | Temp 98.7°F | Resp 15 | Wt 159.5 lb

## 2018-03-29 DIAGNOSIS — M545 Low back pain, unspecified: Secondary | ICD-10-CM

## 2018-03-29 DIAGNOSIS — R109 Unspecified abdominal pain: Secondary | ICD-10-CM | POA: Diagnosis not present

## 2018-03-29 DIAGNOSIS — Z122 Encounter for screening for malignant neoplasm of respiratory organs: Secondary | ICD-10-CM

## 2018-03-29 DIAGNOSIS — J449 Chronic obstructive pulmonary disease, unspecified: Secondary | ICD-10-CM | POA: Diagnosis not present

## 2018-03-29 DIAGNOSIS — F411 Generalized anxiety disorder: Secondary | ICD-10-CM | POA: Diagnosis not present

## 2018-03-29 DIAGNOSIS — M79601 Pain in right arm: Secondary | ICD-10-CM

## 2018-03-29 DIAGNOSIS — G35 Multiple sclerosis: Secondary | ICD-10-CM

## 2018-03-29 DIAGNOSIS — I671 Cerebral aneurysm, nonruptured: Secondary | ICD-10-CM | POA: Diagnosis not present

## 2018-03-29 DIAGNOSIS — A6004 Herpesviral vulvovaginitis: Secondary | ICD-10-CM | POA: Diagnosis not present

## 2018-03-29 MED ORDER — DICLOFENAC SODIUM 1 % TD GEL: 2.0000 g | Freq: Four times a day (QID) | TRANSDERMAL | 4 refills | Status: DC | PRN

## 2018-03-29 MED ORDER — LIDOCAINE 5 % EX PTCH: 1.0000 | MEDICATED_PATCH | CUTANEOUS | 1 refills | Status: DC

## 2018-03-29 MED ORDER — ALPRAZOLAM 0.25 MG PO TABS: 0.2500 mg | ORAL_TABLET | Freq: Every day | ORAL | 0 refills | Status: DC | PRN

## 2018-03-29 MED ORDER — ACYCLOVIR 400 MG PO TABS: 400.0000 mg | ORAL_TABLET | Freq: Every day | ORAL | 3 refills | Status: DC

## 2018-03-29 MED ORDER — TRAMADOL HCL 50 MG PO TABS
50.0000 mg | ORAL_TABLET | Freq: Three times a day (TID) | ORAL | 0 refills | Status: DC | PRN
Start: 2018-03-29 — End: 2018-07-22

## 2018-03-29 NOTE — Assessment & Plan Note (Addendum)
Symptom consistent with SI joint dysfunction based on location of pain. Advised heat, baclofen, tramadol PRN. Pending Xray. If not improvement with conservative therapy, will consult orthopedics.

## 2018-03-29 NOTE — Patient Instructions (Addendum)
Refilled tramadol   Please only get xanax from our office; you Gitto NOT get from another provider   Lets start with xray of low back.  Let me know if not better with conservative therapy.   As for abdominal pain, please stay vigilant and as discussed, please let me know if ANY new symptoms. Please also discuss this with Dr Edmonia Lynch as well when you see her at follow up

## 2018-03-29 NOTE — Progress Notes (Signed)
Subjective:    Patient ID: Analeah Brame Coldren, female    DOB: 1961-06-22, 57 y.o.   MRN: [id number]  CC: Cedar Roseman Leveille is a 57 y.o. female who presents today for follow up.   HPI: CC:  low back pain x 2 days ago, unchanged.  Notes coming up stairs and carrying grocery bag and suddenly felt sharp pain on left side. Took baclofen last night and went to sleep. Some shooting pain down left and right leg, however unsure if this perhaps her MS.  No urinary incontinence, saddle anestheida. No falls.   No h/o dysuria  No h/o renal stone.   Complains of abdominal pain which spans from flanks, under ribbcage, midline, for several months. Unchanged. Pain will last for several minutes and then goes away on its own. No triggers, alleviating facotrs.  Not related to food. No dsyphagia, burping, n, v. Bowels alternate between from diarrhea to constipation.. Colonoscopy done 2012. Unsure if pains related to MS.   GAD- On lexapro.  uses xanax  Prn when feels anxious, such as business transaction, talking to friend.    MS- Follows with Dr Edmonia Lynch.  temporarlity stopped the plavix and due for CT angiogram on 05-09-18.    Dr Hillery Aldo- [facility] neurosurgery 01/2018- managing MS and following aneyrsim. monitor right side aneurysm, left opthalamic aneurysm stable.  Fistural Riegler be able to treat vascularly.   MRCRP 2017 Gallstones, choledocholithiasis; Since s/p cholecystectomy   HISTORY:  Past Medical History:  Diagnosis Date  . Arthritis   . COPD (chronic obstructive pulmonary disease) (HCC)   . Depression   . Fibromyalgia   . Irritable bowel syndrome   . MS (multiple sclerosis) (HCC)    Followed by Dr. Sherryll Burger   Past Surgical History:  Procedure Laterality Date  . ABDOMINAL HYSTERECTOMY    . CHOLECYSTECTOMY N/A 01/03/2016   Procedure: LAPAROSCOPIC CHOLECYSTECTOMY WITH INTRAOPERATIVE CHOLANGIOGRAM;  Surgeon: Lattie Haw, MD;  Location: [facility] ORS;  Service: General;  Laterality: N/A;  . ENDOSCOPIC  RETROGRADE CHOLANGIOPANCREATOGRAPHY (ERCP) WITH PROPOFOL N/A 01/04/2016   Procedure: ENDOSCOPIC RETROGRADE CHOLANGIOPANCREATOGRAPHY (ERCP) WITH PROPOFOL;  Surgeon: Midge Minium, MD;  Location: [facility] ENDOSCOPY;  Service: Endoscopy;  Laterality: N/A;  . FOOT SURGERY  2001  . LAPAROSCOPIC ENDOMETRIOSIS FULGURATION     Family History  Problem Relation Age of Onset  . Hypertension Mother   . Hyperlipidemia Mother   . Arthritis Mother   . Cancer Father        throat  . Heart disease Brother   . Bipolar disorder Son   . Schizophrenia Son   . Cancer Maternal Grandfather        liver  . Cancer Paternal Grandmother        liver    Allergies: Valium [diazepam] Current Outpatient Medications on File Prior to Visit  Medication Sig Dispense Refill  . baclofen (LIORESAL) 10 MG tablet   4  . carbamazepine (CARBATROL) 100 MG 12 hr capsule   1  . clopidogrel (PLAVIX) 75 MG tablet Take 75 mg by mouth.    . escitalopram (LEXAPRO) 10 MG tablet Take 1 tablet (10 mg total) daily by mouth. 90 tablet 1  . Fluticasone-Salmeterol (ADVAIR DISKUS) 100-50 MCG/DOSE AEPB Inhale 1 puff into the lungs 2 (two) times daily as needed (for shortness of breath). 60 each 3   No current facility-administered medications on file prior to visit.     Social History   Tobacco Use  . Smoking status: Current Every Day Smoker  Packs/day: 1.00    Last attempt to quit: 10/21/2008    Years since quitting: 9.4  . Smokeless tobacco: Never Used  Substance Use Topics  . Alcohol use: No    Alcohol/week: 0.0 oz  . Drug use: No    Review of Systems  Constitutional: Negative for chills and fever.  Respiratory: Negative for cough.   Cardiovascular: Negative for chest pain and palpitations.  Gastrointestinal: Positive for abdominal pain, constipation (chronic) and diarrhea (chronic). Negative for abdominal distention, nausea and vomiting.  Musculoskeletal: Positive for back pain.  Neurological: Positive for numbness.        Objective:    BP (!) 144/92 (BP Location: Left Arm, Patient Position: Sitting, Cuff Size: Normal)   Pulse 85   Temp 98.7 F (37.1 C) (Oral)   Resp 15   Wt 159 lb 8 oz (72.3 kg)   SpO2 95%   BMI 27.38 kg/m  BP Readings from Last 3 Encounters:  03/29/18 (!) 144/92  03/25/18 130/70  10/31/17 120/86   Wt Readings from Last 3 Encounters:  03/29/18 159 lb 8 oz (72.3 kg)  03/25/18 160 lb (72.6 kg)  10/31/17 165 lb (74.8 kg)    Physical Exam  Constitutional: She appears well-developed and well-nourished.  Eyes: Conjunctivae are normal.  Cardiovascular: Normal rate, regular rhythm, normal heart sounds and normal pulses.  Pulmonary/Chest: Effort normal and breath sounds normal. She has no wheezes. She has no rhonchi. She has no rales.  Abdominal: Soft. Normal appearance and bowel sounds are normal. She exhibits no distension, no fluid wave, no ascites and no mass. There is generalized tenderness. There is no rigidity, no rebound, no guarding and no CVA tenderness.  Tenderness with soft touch of abdomen.   Musculoskeletal:       Left hip: She exhibits tenderness. She exhibits normal range of motion, normal strength and no bony tenderness.       Lumbar back: She exhibits normal range of motion, no tenderness, no bony tenderness, no swelling, no edema, no pain and no spasm.  Full range of motion with flexion, tension, lateral side bends. No bony tenderness. No pain, numbness, tingling elicited with single leg raise bilaterally.   Left Hip: No limp or waddling gait however she is walking slow today. Full ROM with flexion and hip rotation in flexion.    No pain of lateral hip with  (flexion-abduction-external rotation) test.   Slight pain over SI joint; no pain with deep palpation of greater trochanter.    Neurological: She is alert. She has normal strength. No sensory deficit.  Reflex Scores:      Patellar reflexes are 2+ on the right side and 2+ on the left side. Sensation and  strength intact bilateral lower extremities.  Skin: Skin is warm and dry.  Psychiatric: She has a normal mood and affect. Her speech is normal and behavior is normal. Thought content normal.  Vitals reviewed.      Assessment & Plan:   Problem List Items Addressed This Visit      Cardiovascular and Mediastinum   Cerebral aneurysm    Pending angiogram. Follows with Dr Edmonia Lynch. Will follow        Respiratory   COPD, moderate (HCC)   Relevant Orders   DME Other see comment     Nervous and Auditory   Multiple sclerosis (HCC)    Follows with Dr Edmonia Lynch; will follow.         Genitourinary   Genital herpes   Relevant Medications  acyclovir (ZOVIRAX) 400 MG tablet     Other   Acute left-sided low back pain without sciatica - Primary    Symptom consistent with SI joint dysfunction based on location of pain. Advised heat, baclofen, tramadol PRN. Pending Xray. If not improvement with conservative therapy, will consult orthopedics.       Relevant Medications   traMADol (ULTRAM) 50 MG tablet   lidocaine (LIDODERM) 5 %   Other Relevant Orders   DG Lumbar Spine Complete   Generalized anxiety disorder    Doing well on current regimen. Using xanax prn. I looked up patient on [place] Controlled Substances Reporting System and saw no activity that raised concern of inappropriate use. Of note, she had gotten prescriptions for xanax and ambien through neurology.She is not taking the [place]. She would like for our office to prescribe the xanax and I advised her that per policy , she Randle only get from our clinic moving forward. She Traum not get from multiple prescribers. She was very understanding of this.        Relevant Medications   ALPRAZolam (XANAX) 0.25 MG tablet   Other Relevant Orders   Ambulatory referral to Psychology   Abdominal pain    Unchanged. Etiology unclear at this time. Well appearing today. She does not appear septic. Exam revealed generalized tenderness to light touch.  Discussed with patient is this related to MS or fibromyalgia. She and I felt comfortable staying vigilant with symptom and she would discuss at follow with Dr. Edmonia Lynch. If any new symptom or changes, patient will let me know right right away.       Right arm pain   Relevant Medications   diclofenac sodium (VOLTAREN) 1 % GEL    Other Visit Diagnoses    Encounter for screening for malignant neoplasm of respiratory organs       Relevant Orders   CT CHEST LUNG CANCER SCREENING LOW DOSE WO CONTRAST       I have discontinued Aby D. Binegar's sertraline. I am also having her start on lidocaine. Additionally, I am having her maintain her Fluticasone-Salmeterol, clopidogrel, baclofen, carbamazepine, escitalopram, acyclovir, diclofenac sodium, traMADol, and ALPRAZolam.   Meds ordered this encounter  Medications  . acyclovir (ZOVIRAX) 400 MG tablet    Sig: Take 1 tablet (400 mg total) by mouth daily.    Dispense:  90 tablet    Refill:  3  . diclofenac sodium (VOLTAREN) 1 % GEL    Sig: Apply 2 g topically 4 (four) times daily as needed (for pain).    Dispense:  1 Tube    Refill:  4  . traMADol (ULTRAM) 50 MG tablet    Sig: Take 1-2 tablets (50-100 mg total) by mouth every 8 (eight) hours as needed.    Dispense:  60 tablet    Refill:  0    Order Specific Question:   Supervising Provider    Answer:   Duncan Dull L [[id number]]  . lidocaine (LIDODERM) 5 %    Sig: Place 1 patch onto the skin daily. Remove & Discard patch within 12 hours.    Dispense:  30 patch    Refill:  1    Order Specific Question:   Supervising Provider    Answer:   Duncan Dull L [[id number]]  . ALPRAZolam (XANAX) 0.25 MG tablet    Sig: Take 1 tablet (0.25 mg total) by mouth daily as needed for anxiety.    Dispense:  30 tablet    Refill:  0    Order Specific Question:   Supervising Provider    Answer:   Sherlene Shams [[id number]]    Return precautions given.   Risks, benefits, and alternatives of the medications and treatment  plan prescribed today were discussed, and patient expressed understanding.   Education regarding symptom management and diagnosis given to patient on AVS.  Continue to follow with Allegra Grana, FNP for routine health maintenance.   Kortny D Milner and I agreed with plan.   Rennie Plowman, FNP

## 2018-03-31 ENCOUNTER — Encounter: Payer: Self-pay | Admitting: Family

## 2018-03-31 NOTE — Assessment & Plan Note (Addendum)
Pending angiogram. Follows with Dr Edmonia Lynch. Will follow

## 2018-03-31 NOTE — Assessment & Plan Note (Signed)
Doing well on current regimen. Using xanax prn. I looked up patient on  Controlled Substances Reporting System and saw no activity that raised concern of inappropriate use. Of note, she had gotten prescriptions for xanax and ambien through neurology.She is not taking the [place]. She would like for our office to prescribe the xanax and I advised her that per policy , she Callies only get from our clinic moving forward. She Bara not get from multiple prescribers. She was very understanding of this.

## 2018-03-31 NOTE — Assessment & Plan Note (Addendum)
Unchanged. Etiology unclear at this time. Well appearing today. She does not appear septic. Exam revealed generalized tenderness to light touch. Discussed with patient is this related to MS or fibromyalgia. She and I felt comfortable staying vigilant with symptom and she would discuss at follow with Dr. Edmonia Lynch. If any new symptom or changes, patient will let me know right right away.

## 2018-03-31 NOTE — Assessment & Plan Note (Signed)
Follows with Dr Edmonia Lynch; will follow.

## 2018-04-02 ENCOUNTER — Encounter: Payer: Self-pay | Admitting: Family

## 2018-04-02 ENCOUNTER — Other Ambulatory Visit: Payer: Self-pay | Admitting: Family

## 2018-04-02 ENCOUNTER — Telehealth: Payer: Self-pay | Admitting: *Deleted

## 2018-04-02 DIAGNOSIS — M545 Low back pain, unspecified: Secondary | ICD-10-CM

## 2018-04-02 DIAGNOSIS — J449 Chronic obstructive pulmonary disease, unspecified: Secondary | ICD-10-CM

## 2018-04-02 MED ORDER — FLUTICASONE-SALMETEROL 100-50 MCG/DOSE IN AEPB: 1.0000 | INHALATION_SPRAY | Freq: Two times a day (BID) | RESPIRATORY_TRACT | 3 refills | Status: DC | PRN

## 2018-04-02 MED ORDER — ALBUTEROL SULFATE HFA 108 (90 BASE) MCG/ACT IN AERS
2.0000 | INHALATION_SPRAY | Freq: Four times a day (QID) | RESPIRATORY_TRACT | 3 refills | Status: DC | PRN
Start: 2018-04-02 — End: 2018-04-05

## 2018-04-02 NOTE — Progress Notes (Signed)
Spoke to patient she states she will consult her neurologist in regards to abdominal pain.  She would like to see orthopedics in [place] for back pain.

## 2018-04-02 NOTE — Progress Notes (Signed)
Ref to [employer]

## 2018-04-02 NOTE — Telephone Encounter (Signed)
Received referral for low dose lung cancer screening CT scan. Message left at phone number listed in EMR for patient to call me back to facilitate scheduling scan.  

## 2018-04-03 ENCOUNTER — Telehealth: Payer: Self-pay | Admitting: *Deleted

## 2018-04-03 NOTE — Telephone Encounter (Signed)
Copied from CRM [id number]. Topic: Inquiry >> Apr 02, 2018  6:41 PM Alexander Bergeron B wrote: Reason for CRM: pt called and asked to speak w/ pcp/nurse about what options she has for the care of her back; pt would liked to be called after 2pm

## 2018-04-03 NOTE — Unmapped (Signed)
Patient called needing to get her most recent MRIs on CD for a doctor she is seeing for her back.  I provided the phone # for our imaging office and advised her to contact them to obtain this.  [phone].    She also asked if she had a follow up with Dr Johnnye Lana which she does not though she is due soon.  Patient said she would call back at another time to schedule.

## 2018-04-04 ENCOUNTER — Telehealth: Payer: Self-pay | Admitting: *Deleted

## 2018-04-04 DIAGNOSIS — Z122 Encounter for screening for malignant neoplasm of respiratory organs: Secondary | ICD-10-CM

## 2018-04-04 DIAGNOSIS — Z87891 Personal history of nicotine dependence: Secondary | ICD-10-CM

## 2018-04-04 NOTE — Telephone Encounter (Signed)
Spoke with patient she states she has already to discussed with Claris Che via my chart , see my chart message dated 03/30/18

## 2018-04-04 NOTE — Telephone Encounter (Signed)
Received referral for low dose lung cancer screening CT scan. Message left at phone number listed in EMR for patient to call me back to facilitate scheduling scan.  

## 2018-04-04 NOTE — Telephone Encounter (Signed)
Received referral for initial lung cancer screening scan. Contacted patient and obtained smoking history,(current, 43 pack year) as well as answering questions related to screening process. Patient denies signs of lung cancer such as weight loss or hemoptysis. Patient denies comorbidity that would prevent curative treatment if lung cancer were found. Patient is scheduled for shared decision making visit and CT scan on 04/12/18.

## 2018-04-05 ENCOUNTER — Other Ambulatory Visit: Payer: Self-pay | Admitting: Family

## 2018-04-05 DIAGNOSIS — J449 Chronic obstructive pulmonary disease, unspecified: Secondary | ICD-10-CM

## 2018-04-05 MED ORDER — ALBUTEROL SULFATE 108 (90 BASE) MCG/ACT IN AEPB
90.0000 ug | INHALATION_SPRAY | Freq: Four times a day (QID) | RESPIRATORY_TRACT | 2 refills | Status: DC | PRN
Start: 2018-04-05 — End: 2021-01-25

## 2018-04-09 NOTE — Unmapped (Signed)
[facility] Refill and Clinical Coordination Note  Medication(s): Tecfidera 240mg     Jennifer Lewis, DOB: June 04, 1961  Phone: [phone] (home) , Alternate phone contact: N/A  Shipping address: [address]  [place] [address]  Phone or address changes today?: No  All above HIPAA information verified.  Insurance changes? No    Completed refill and clinical call assessment today to schedule patient's medication shipment from the [facility] [phone]).      MEDICATION RECONCILIATION    Confirmed the medication and dosage are correct and have not changed: Yes, regimen is correct and unchanged.    Were there any changes to your medication(s) in the past month:  No, there are no changes reported at this time.    ADHERENCE    Is this medicine transplant or covered by Medicare Part B? No.    Did you miss any doses in the past 4 weeks? No missed doses reported.  Adherence counseling provided? Not needed     SIDE EFFECT MANAGEMENT    Are you tolerating your medication?:  Myia reports tolerating the medication.  Side effect management discussed: None      Therapy is appropriate and should be continued.    Evidence of clinical benefit: See Epic note from 01/02/2018      FINANCIAL/SHIPPING    Delivery Scheduled: Yes, Expected medication delivery date: 04/12/2018   Additional medications refilled: No additional medications/refills needed at this time.    The patient will receive an FSI print out for each medication shipped and additional FDA Medication Guides as required.  Patient education from [place] or Robet Leu Sumners also be included in the shipment.    Tuwanda did not have any additional questions at this time.    Delivery address validated in FSI scheduling system: Yes, address listed above is correct.      We will follow up with patient monthly for standard refill processing and delivery.      Thank you,  Donielle Kaigler  Anders Grant   [facility] Specialty Pharmacist

## 2018-04-10 ENCOUNTER — Ambulatory Visit: Payer: Medicare Other | Admitting: Psychology

## 2018-04-11 MED FILL — TECFIDERA/240MG/CPDR: TECFIDERA/240MG/CPDR | 30 days supply | Qty: 60 | Fill #0

## 2018-04-12 ENCOUNTER — Encounter: Payer: Self-pay | Admitting: Nurse Practitioner

## 2018-04-12 ENCOUNTER — Ambulatory Visit: Admission: RE | Admit: 2018-04-12 | Payer: Medicare Other | Source: Ambulatory Visit

## 2018-04-12 ENCOUNTER — Inpatient Hospital Stay: Payer: Medicare Other | Admitting: Nurse Practitioner

## 2018-04-16 ENCOUNTER — Telehealth: Payer: Self-pay | Admitting: *Deleted

## 2018-04-16 NOTE — Telephone Encounter (Signed)
Patient has office visit 04/19/18

## 2018-04-16 NOTE — Telephone Encounter (Signed)
Patient is agreeable to reschedule lung screening scan. Please see prior documentation of eligibility criteria. Patient is rescheduled for 04/30/18.

## 2018-04-19 ENCOUNTER — Other Ambulatory Visit (HOSPITAL_COMMUNITY)
Admission: RE | Admit: 2018-04-19 | Discharge: 2018-04-19 | Disposition: A | Discharge status: Home / Self Care | Payer: Medicare Other | Source: Ambulatory Visit | Attending: Family | Admitting: Family

## 2018-04-19 ENCOUNTER — Ambulatory Visit (INDEPENDENT_AMBULATORY_CARE_PROVIDER_SITE_OTHER): Payer: Medicare Other | Admitting: Family

## 2018-04-19 ENCOUNTER — Encounter: Payer: Self-pay | Admitting: Family

## 2018-04-19 VITALS — BP 124/82 | HR 105 | Temp 98.2°F | Wt 158.5 lb

## 2018-04-19 DIAGNOSIS — Z0000 Encounter for general adult medical examination without abnormal findings: Secondary | ICD-10-CM | POA: Diagnosis not present

## 2018-04-19 DIAGNOSIS — R109 Unspecified abdominal pain: Secondary | ICD-10-CM

## 2018-04-19 DIAGNOSIS — M545 Low back pain, unspecified: Secondary | ICD-10-CM

## 2018-04-19 DIAGNOSIS — A6004 Herpesviral vulvovaginitis: Secondary | ICD-10-CM | POA: Diagnosis not present

## 2018-04-19 DIAGNOSIS — R21 Rash and other nonspecific skin eruption: Secondary | ICD-10-CM

## 2018-04-19 NOTE — Assessment & Plan Note (Signed)
Unchanged. Discussed again today that she would discuss with her neurologist. We jointly agree [place] be contributed to from fibromyalgia/ms.

## 2018-04-19 NOTE — Assessment & Plan Note (Addendum)
Medical release form for colonoscopy to see when she is due for repeat. Declines mammogram. CBE performed. Pending pap to look for cervical cells in context of hysterectomy. Advised tdap at local pharmacy. Pending ct chest due to smoking history. Advised tdap at local pharmacy

## 2018-04-19 NOTE — Assessment & Plan Note (Signed)
Chronic. Etiology unclear. Consult dermatology

## 2018-04-19 NOTE — Assessment & Plan Note (Signed)
Unchanged. Following with orthopedics, PT. Will follow.

## 2018-04-19 NOTE — Progress Notes (Signed)
Subjective:    Patient ID: Debra Hogan, female    DOB: January 28, 1961, 57 y.o.   MRN: [id number]  CC: Debra Hogan is a 57 y.o. female who presents today for physical exam.    HPI: Overall, states she is feeling well today.  No new complaints.  Left side low back pain- largely unchanged; more towards middle of back now. Following with PT and orthopedics, Dr Tyler Deis. Using tramadol, heat, baclofen with relief.   Abdominal pain- none today. 'not any worse'. Plans to talk to Dr Edmonia Lynch about it as told in past, it was her MS. Has bouts of constipation and diarrhea. No constipation at this time.   HSV II- on chronic suppression therapy. No active herpetic lesions.  Notes purple rash since diagnosed with genital herpes, will occasionally notice new lesion, but it is slow.    Colorectal Cancer Screening: done 6 years ago; per patient, no polyps. Doesn't recall when to return Breast Cancer Screening: declines mammogram due to pain.  Cervical Cancer Screening: h/o hysterectomy, unsure if has ovaries.  Bone Health screening/DEXA for 65+: No increased fracture risk. NO h/o fracture. Defer screening at this time. Lung Cancer Screening: Ct Chest scheduled. 04/30/18.  Immunizations       Tetanus - due         Labs: Screening labs today. Exercise: Gets regular exercise.  Alcohol use: none Smoking/tobacco use: smoker. CT chest schedule 04/30/18 Regular dental exams:  Wears seat belt: Yes. Skin: no h/o skin cancer  HISTORY:  Past Medical History:  Diagnosis Date  . Arthritis   . COPD (chronic obstructive pulmonary disease) (HCC)   . Depression   . Fibromyalgia   . Irritable bowel syndrome   . MS (multiple sclerosis) (HCC)    Followed by Dr. Sherryll Burger    Past Surgical History:  Procedure Laterality Date  . ABDOMINAL HYSTERECTOMY    . CHOLECYSTECTOMY N/A 01/03/2016   Procedure: LAPAROSCOPIC CHOLECYSTECTOMY WITH INTRAOPERATIVE CHOLANGIOGRAM;  Surgeon: Lattie Haw, MD;  Location: [facility] ORS;   Service: General;  Laterality: N/A;  . ENDOSCOPIC RETROGRADE CHOLANGIOPANCREATOGRAPHY (ERCP) WITH PROPOFOL N/A 01/04/2016   Procedure: ENDOSCOPIC RETROGRADE CHOLANGIOPANCREATOGRAPHY (ERCP) WITH PROPOFOL;  Surgeon: Midge Minium, MD;  Location: [facility] END[facility];  Service: Endoscopy;  Laterality: N/A;  . FOOT SURGERY  2001  . LAPAROSCOPIC ENDOMETRIOSIS FULGURATION     Family History  Problem Relation Age of Onset  . Hypertension Mother   . Hyperlipidemia Mother   . Arthritis Mother   . Cancer Father        throat  . Heart disease Brother   . Bipolar disorder Son   . Schizophrenia Son   . Cancer Maternal Grandfather        liver  . Cancer Paternal Grandmother        liver      ALLERGIES: Valium [diazepam]  Current Outpatient Medications on File Prior to Visit  Medication Sig Dispense Refill  . acyclovir (ZOVIRAX) 400 MG tablet Take 1 tablet (400 mg total) by mouth daily. 90 tablet 3  . Albuterol Sulfate (PROAIR RESPICLICK) 108 (90 Base) MCG/ACT AEPB Inhale 90 mcg into the lungs every 6 (six) hours as needed (as needed for cough, wheezing). 1 each 2  . ALPRAZolam (XANAX) 0.25 MG tablet Take 1 tablet (0.25 mg total) by mouth daily as needed for anxiety. 30 tablet 0  . baclofen (LIORESAL) 10 MG tablet   4  . carbamazepine (CARBATROL) 100 MG 12 hr capsule   1  .  clopidogrel (PLAVIX) 75 MG tablet Take 75 mg by mouth.    . diclofenac sodium (VOLTAREN) 1 % GEL Apply 2 g topically 4 (four) times daily as needed (for pain). 1 Tube 4  . escitalopram (LEXAPRO) 10 MG tablet Take 1 tablet (10 mg total) daily by mouth. 90 tablet 1  . Fluticasone-Salmeterol (ADVAIR DISKUS) 100-50 MCG/DOSE AEPB Inhale 1 puff into the lungs 2 (two) times daily as needed (for shortness of breath). 60 each 3  . lidocaine (LIDODERM) 5 % Place 1 patch onto the skin daily. Remove & Discard patch within 12 hours. 30 patch 1  . traMADol (ULTRAM) 50 MG tablet Take 1-2 tablets (50-100 mg total) by mouth every 8 (eight) hours as  needed. 60 tablet 0   No current facility-administered medications on file prior to visit.     Social History   Tobacco Use  . Smoking status: Current Every Day Smoker    Packs/day: 1.00    Years: 43.00    Pack years: 43.00  . Smokeless tobacco: Never Used  Substance Use Topics  . Alcohol use: No    Alcohol/week: 0.0 oz  . Drug use: No    Review of Systems  Constitutional: Negative for chills, fever and unexpected weight change.  HENT: Negative for congestion.   Respiratory: Negative for cough.   Cardiovascular: Negative for chest pain, palpitations and leg swelling.  Gastrointestinal: Positive for abdominal pain (chronic, unchanged). Negative for nausea and vomiting.  Musculoskeletal: Positive for back pain (chronic). Negative for arthralgias and myalgias.  Skin: Positive for rash.  Neurological: Negative for numbness and headaches.  Hematological: Negative for adenopathy.  Psychiatric/Behavioral: Negative for confusion.      Objective:    BP 124/82 (BP Location: Left Arm, Patient Position: Sitting, Cuff Size: Normal)   Pulse (!) 105   Temp 98.2 F (36.8 C) (Oral)   Wt 158 lb 8 oz (71.9 kg)   SpO2 96%   BMI 27.21 kg/m   BP Readings from Last 3 Encounters:  04/22/18 124/82  03/29/18 (!) 144/92  03/25/18 130/70   Wt Readings from Last 3 Encounters:  04/22/18 158 lb 8 oz (71.9 kg)  03/29/18 159 lb 8 oz (72.3 kg)  03/25/18 160 lb (72.6 kg)    Physical Exam  Constitutional: She appears well-developed and well-nourished.  Eyes: Conjunctivae are normal.  Neck: No thyroid mass and no thyromegaly present.  Cardiovascular: Normal rate, regular rhythm, normal heart sounds and normal pulses.  Pulmonary/Chest: Effort normal and breath sounds normal. She has no wheezes. She has no rhonchi. She has no rales. Right breast exhibits no inverted nipple, no mass, no nipple discharge, no skin change and no tenderness. Left breast exhibits no inverted nipple, no mass, no nipple  discharge, no skin change and no tenderness. Breasts are symmetrical.  No masses or asymmetry appreciated during CBE.  Genitourinary: Uterus is not enlarged, not fixed and not tender. Cervix exhibits no motion tenderness, no discharge and no friability. Right adnexum displays no mass, no tenderness and no fullness. Left adnexum displays no mass, no tenderness and no fullness.  Genitourinary Comments: Pap performed, question cervix. Pending pathology report from pap.  No CMT. Unable to appreciated ovaries.  Lymphadenopathy:       Head (right side): No submental, no submandibular, no tonsillar, no preauricular, no posterior auricular and no occipital adenopathy present.       Head (left side): No submental, no submandibular, no tonsillar, no preauricular, no posterior auricular and no occipital adenopathy  present.       Right cervical: No superficial cervical, no deep cervical and no posterior cervical adenopathy present.      Left cervical: No superficial cervical, no deep cervical and no posterior cervical adenopathy present.    She has no axillary adenopathy.       Right axillary: No pectoral and no lateral adenopathy present.       Left axillary: No pectoral and no lateral adenopathy present. Neurological: She is alert.  Skin: Skin is warm and dry.  Diffuse petechial rash over trunk.   Psychiatric: She has a normal mood and affect. Her speech is normal and behavior is normal. Thought content normal.  Vitals reviewed.      Assessment & Plan:   Problem List Items Addressed This Visit      Musculoskeletal and Integument   Rash    Chronic. Etiology unclear. Consult dermatology      Relevant Orders   Ambulatory referral to Dermatology     Genitourinary   Genital herpes    No active lesions.  Controlled with chronic suppression therapy.        Other   Acute left-sided low back pain without sciatica    Unchanged. Following with orthopedics, PT. Will follow.       Routine  physical examination - Primary    Medical release form for colonoscopy to see when she is due for repeat. Declines mammogram. CBE performed. Pending pap to look for cervical cells in context of hysterectomy. Advised tdap at local pharmacy. Pending ct chest due to smoking history. Advised tdap at local pharmacy      Relevant Orders   Cytology - PAP   CBC with Differential/Platelet   Comprehensive metabolic panel   Hemoglobin A1c   Lipid panel   TSH   VITAMIN D 25 Hydroxy (Vit-D Deficiency, Fractures)   Abdominal pain    Unchanged. Discussed again today that she would discuss with her neurologist. We jointly agree [place] be contributed to from fibromyalgia/ms.           I am having Rika D. Calixto maintain her clopidogrel, baclofen, carbamazepine, escitalopram, acyclovir, diclofenac sodium, traMADol, lidocaine, ALPRAZolam, Fluticasone-Salmeterol, and Albuterol Sulfate.   No orders of the defined types were placed in this encounter.   Return precautions given.   Risks, benefits, and alternatives of the medications and treatment plan prescribed today were discussed, and patient expressed understanding.   Education regarding symptom management and diagnosis given to patient on AVS.   Continue to follow with Allegra Grana, FNP for routine health maintenance.   Shantara D Sia and I agreed with plan.   Rennie Plowman, FNP

## 2018-04-19 NOTE — Patient Instructions (Addendum)
Labs when fasting  Requesting records of colonoscopy to see when you are due.   pending Pap smear to identify if cervical cells present.  Please ensure you do bring surgical report from hysterectomy so we can determine if you have a cervix, ovaries.   Today we discussed referrals, orders. DERMATOLOGY   I have placed these orders in the system for you.  Please be sure to give [place] a call if you have not heard from our office regarding scheduling a test or regarding referral in a timely manner.  It is very important that you let me know as soon as possible.   Health Maintenance, Female Adopting a healthy lifestyle and getting preventive care can go a long way to promote health and wellness. Talk with your health care provider about what schedule of regular examinations is right for you. This is a good chance for you to check in with your provider about disease prevention and staying healthy. In between checkups, there are plenty of things you can do on your own. Experts have done a lot of research about which lifestyle changes and preventive measures are most likely to keep you healthy. Ask your health care provider for more information. Weight and diet Eat a healthy diet  Be sure to include plenty of vegetables, fruits, low-fat dairy products, and lean protein.  Do not eat a lot of foods high in solid fats, added sugars, or salt.  Get regular exercise. This is one of the most important things you can do for your health. ? Most adults should exercise for at least 150 minutes each week. The exercise should increase your heart rate and make you sweat (moderate-intensity exercise). ? Most adults should also do strengthening exercises at least twice a week. This is in addition to the moderate-intensity exercise.  Maintain a healthy weight  Body mass index (BMI) is a measurement that can be used to identify possible weight problems. It estimates body fat based on height and weight. Your health care  provider can help determine your BMI and help you achieve or maintain a healthy weight.  For females 63 years of age and older: ? A BMI below 18.5 is considered underweight. ? A BMI of 18.5 to 24.9 is normal. ? A BMI of 25 to 29.9 is considered overweight. ? A BMI of 30 and above is considered obese.  Watch levels of cholesterol and blood lipids  You should start having your blood tested for lipids and cholesterol at 59 years of age, then have this test every 5 years.  You Pe[place]inpaugh need to have your cholesterol levels checked more often if: ? Your lipid or cholesterol levels are high. ? You are older than 59 years of age. ? You are at high risk for heart disease.  Cancer screening Lung Cancer  Lung cancer screening is recommended for adults 27-55 years old who are at high risk for lung cancer because of a history of smoking.  A yearly low-dose CT scan of the lungs is recommended for people who: ? Currently smoke. ? Have quit within the past 15 years. ? Have at least a 30-pack-year history of smoking. A pack year is smoking an average of one pack of cigarettes a day for 1 year.  Yearly screening should continue until it has been 15 years since you quit.  Yearly screening should stop if you develop a health problem that would prevent you from having lung cancer treatment.  Breast Cancer  Practice breast self-awareness. This  means understanding how your breasts normally appear and feel.  It also means doing regular breast self-exams. Let your health care provider know about any changes, no matter how small.  If you are in your 20s or 30s, you should have a clinical breast exam (CBE) by a health care provider every 1-3 years as part of a regular health exam.  If you are 57 or older, have a CBE every year. Also consider having a breast X-ray (mammogram) every year.  If you have a family history of breast cancer, talk to your health care provider about genetic screening.  If you are  at high risk for breast cancer, talk to your health care provider about having an MRI and a mammogram every year.  Breast cancer gene (BRCA) assessment is recommended for women who have family members with BRCA-related cancers. BRCA-related cancers include: ? Breast. ? Ovarian. ? Tubal. ? Peritoneal cancers.  Results of the assessment will determine the need for genetic counseling and BRCA1 and BRCA2 testing.  Cervical Cancer Your health care provider Placke recommend that you be screened regularly for cancer of the pelvic organs (ovaries, uterus, and vagina). This screening involves a pelvic examination, including checking for microscopic changes to the surface of your cervix (Pap test). You Schnoebelen be encouraged to have this screening done every 3 years, beginning at age 42.  For women ages 21-65, health care providers Haji recommend pelvic exams and Pap testing every 3 years, or they Udell recommend the Pap and pelvic exam, combined with testing for human papilloma virus (HPV), every 5 years. Some types of HPV increase your risk of cervical cancer. Testing for HPV Larabee also be done on women of any age with unclear Pap test results.  Other health care providers Englander not recommend any screening for nonpregnant women who are considered low risk for pelvic cancer and who do not have symptoms. Ask your health care provider if a screening pelvic exam is right for you.  If you have had past treatment for cervical cancer or a condition that could lead to cancer, you need Pap tests and screening for cancer for at least 20 years after your treatment. If Pap tests have been discontinued, your risk factors (such as having a new sexual partner) need to be reassessed to determine if screening should resume. Some women have medical problems that increase the chance of getting cervical cancer. In these cases, your health care provider Oshields recommend more frequent screening and Pap tests.  Colorectal Cancer  This type of  cancer can be detected and often prevented.  Routine colorectal cancer screening usually begins at 59 years of age and continues through 59 years of age.  Your health care provider Rubalcava recommend screening at an earlier age if you have risk factors for colon cancer.  Your health care provider Lycan also recommend using home test kits to check for hidden blood in the stool.  A small camera at the end of a tube can be used to examine your colon directly (sigmoidoscopy or colonoscopy). This is done to check for the earliest forms of colorectal cancer.  Routine screening usually begins at age 62.  Direct examination of the colon should be repeated every 5-10 years through 59 years of age. However, you Karas need to be screened more often if early forms of precancerous polyps or small growths are found.  Skin Cancer  Check your skin from head to toe regularly.  Tell your health care provider about any new  moles or changes in moles, especially if there is a change in a mole's shape or color.  Also tell your health care provider if you have a mole that is larger than the size of a pencil eraser.  Always use sunscreen. Apply sunscreen liberally and repeatedly throughout the day.  Protect yourself by wearing long sleeves, pants, a wide-brimmed hat, and sunglasses whenever you are outside.  Heart disease, diabetes, and high blood pressure  High blood pressure causes heart disease and increases the risk of stroke. High blood pressure is more likely to develop in: ? People who have blood pressure in the high end of the normal range (130-139/85-89 mm Hg). ? People who are overweight or obese. ? People who are African American.  If you are 70-73 years of age, have your blood pressure checked every 3-5 years. If you are 83 years of age or older, have your blood pressure checked every year. You should have your blood pressure measured twice-once when you are at a hospital or clinic, and once when you are  not at a hospital or clinic. Record the average of the two measurements. To check your blood pressure when you are not at a hospital or clinic, you can use: ? An automated blood pressure machine at a pharmacy. ? A home blood pressure monitor.  If you are between 21 years and 47 years old, ask your health care provider if you should take aspirin to prevent strokes.  Have regular diabetes screenings. This involves taking a blood sample to check your fasting blood sugar level. ? If you are at a normal weight and have a low risk for diabetes, have this test once every three years after 59 years of age. ? If you are overweight and have a high risk for diabetes, consider being tested at a younger age or more often. Preventing infection Hepatitis B  If you have a higher risk for hepatitis B, you should be screened for this virus. You are considered at high risk for hepatitis B if: ? You were born in a country where hepatitis B is common. Ask your health care provider which countries are considered high risk. ? Your parents were born in a high-risk country, and you have not been immunized against hepatitis B (hepatitis B vaccine). ? You have HIV or AIDS. ? You use needles to inject street drugs. ? You live with someone who has hepatitis B. ? You have had sex with someone who has hepatitis B. ? You get hemodialysis treatment. ? You take certain medicines for conditions, including cancer, organ transplantation, and autoimmune conditions.  Hepatitis C  Blood testing is recommended for: ? Everyone born from [birth year] through 1967. ? Anyone with known risk factors for hepatitis C.  Sexually transmitted infections (STIs)  You should be screened for sexually transmitted infections (STIs) including gonorrhea and chlamydia if: ? You are sexually active and are younger than 59 years of age. ? You are older than 59 years of age and your health care provider tells you that you are at risk for this type of  infection. ? Your sexual activity has changed since you were last screened and you are at an increased risk for chlamydia or gonorrhea. Ask your health care provider if you are at risk.  If you do not have HIV, but are at risk, it Camberos be recommended that you take a prescription medicine daily to prevent HIV infection. This is called pre-exposure prophylaxis (PrEP). You are considered at risk  if: ? You are sexually active and do not regularly use condoms or know the HIV status of your partner(s). ? You take drugs by injection. ? You are sexually active with a partner who has HIV.  Talk with your health care provider about whether you are at high risk of being infected with HIV. If you choose to begin PrEP, you should first be tested for HIV. You should then be tested every 3 months for as long as you are taking PrEP. Pregnancy  If you are premenopausal and you Profitt become pregnant, ask your health care provider about preconception counseling.  If you Moye become pregnant, take 400 to 800 micrograms (mcg) of folic acid every day.  If you want to prevent pregnancy, talk to your health care provider about birth control (contraception). Osteoporosis and menopause  Osteoporosis is a disease in which the bones lose minerals and strength with aging. This can result in serious bone fractures. Your risk for osteoporosis can be identified using a bone density scan.  If you are 73 years of age or older, or if you are at risk for osteoporosis and fractures, ask your health care provider if you should be screened.  Ask your health care provider whether you should take a calcium or vitamin D supplement to lower your risk for osteoporosis.  Menopause Ned have certain physical symptoms and risks.  Hormone replacement therapy Isip reduce some of these symptoms and risks. Talk to your health care provider about whether hormone replacement therapy is right for you. Follow these instructions at home:  Schedule  regular health, dental, and eye exams.  Stay current with your immunizations.  Do not use any tobacco products including cigarettes, chewing tobacco, or electronic cigarettes.  If you are pregnant, do not drink alcohol.  If you are breastfeeding, limit how much and how often you drink alcohol.  Limit alcohol intake to no more than 1 drink per day for nonpregnant women. One drink equals 12 ounces of beer, 5 ounces of wine, or 1 ounces of hard liquor.  Do not use street drugs.  Do not share needles.  Ask your health care provider for help if you need support or information about quitting drugs.  Tell your health care provider if you often feel depressed.  Tell your health care provider if you have ever been abused or do not feel safe at home. This information is not intended to replace advice given to you by your health care provider. Make sure you discuss any questions you have with your health care provider. Document Released: 06/26/2011 Document Revised: 05/18/2016 Document Reviewed: 09/14/2015 Elsevier Interactive Patient Education  [employer].

## 2018-04-20 NOTE — Assessment & Plan Note (Signed)
No active lesions.  Controlled with chronic suppression therapy.

## 2018-04-24 ENCOUNTER — Ambulatory Visit: Payer: Medicare Other | Admitting: Psychology

## 2018-04-24 ENCOUNTER — Encounter: Payer: Self-pay | Admitting: Family

## 2018-04-24 LAB — CYTOLOGY - PAP
Diagnosis: NEGATIVE
HPV: NOT DETECTED

## 2018-04-26 ENCOUNTER — Encounter: Payer: Self-pay | Admitting: Family

## 2018-04-30 ENCOUNTER — Inpatient Hospital Stay: Payer: Medicare Other | Attending: Oncology | Admitting: Oncology

## 2018-04-30 ENCOUNTER — Ambulatory Visit
Admission: RE | Admit: 2018-04-30 | Discharge: 2018-04-30 | Disposition: A | Discharge status: Home / Self Care | Payer: Medicare Other | Source: Ambulatory Visit | Attending: Nurse Practitioner | Admitting: Nurse Practitioner

## 2018-04-30 DIAGNOSIS — Z122 Encounter for screening for malignant neoplasm of respiratory organs: Secondary | ICD-10-CM

## 2018-04-30 DIAGNOSIS — I313 Pericardial effusion (noninflammatory): Secondary | ICD-10-CM | POA: Diagnosis not present

## 2018-04-30 DIAGNOSIS — J432 Centrilobular emphysema: Secondary | ICD-10-CM | POA: Diagnosis not present

## 2018-04-30 DIAGNOSIS — I700 Atherosclerosis of aorta: Secondary | ICD-10-CM | POA: Diagnosis not present

## 2018-04-30 DIAGNOSIS — Z87891 Personal history of nicotine dependence: Secondary | ICD-10-CM

## 2018-04-30 DIAGNOSIS — F17210 Nicotine dependence, cigarettes, uncomplicated: Secondary | ICD-10-CM

## 2018-04-30 NOTE — Progress Notes (Signed)
In accordance with CMS guidelines, patient has met eligibility criteria including age, absence of signs or symptoms of lung cancer.  Social History   Tobacco Use  . Smoking status: Current Every Day Smoker    Packs/day: 1.00    Years: 43.00    Pack years: 43.00  . Smokeless tobacco: Never Used  Substance Use Topics  . Alcohol use: No    Alcohol/week: 0.0 oz  . Drug use: No     A shared decision-making session was conducted prior to the performance of CT scan. This includes one or more decision aids, includes benefits and harms of screening, follow-up diagnostic testing, over-diagnosis, false positive rate, and total radiation exposure.  Counseling on the importance of adherence to annual lung cancer LDCT screening, impact of co-morbidities, and ability or willingness to undergo diagnosis and treatment is imperative for compliance of the program.  Counseling on the importance of continued smoking cessation for former smokers; the importance of smoking cessation for current smokers, and information about tobacco cessation interventions have been given to patient including [place] and 1800 quit [place] programs.  Written order for lung cancer screening with LDCT has been given to the patient and any and all questions have been answered to the best of my abilities.   Yearly follow up will be coordinated by Burgess Estelle, Thoracic Navigator.  Faythe Casa, NP 04/30/2018 3:07 PM

## 2018-05-03 ENCOUNTER — Encounter: Payer: Self-pay | Admitting: *Deleted

## 2018-05-08 ENCOUNTER — Encounter: Payer: Self-pay | Admitting: Family

## 2018-05-08 ENCOUNTER — Ambulatory Visit (INDEPENDENT_AMBULATORY_CARE_PROVIDER_SITE_OTHER): Payer: Medicare Other | Admitting: Family

## 2018-05-08 VITALS — BP 135/80 | HR 68 | Temp 98.4°F | Resp 16 | Wt 159.1 lb

## 2018-05-08 DIAGNOSIS — I700 Atherosclerosis of aorta: Secondary | ICD-10-CM | POA: Diagnosis not present

## 2018-05-08 DIAGNOSIS — R911 Solitary pulmonary nodule: Secondary | ICD-10-CM | POA: Diagnosis not present

## 2018-05-08 DIAGNOSIS — R030 Elevated blood-pressure reading, without diagnosis of hypertension: Secondary | ICD-10-CM

## 2018-05-08 DIAGNOSIS — Z0000 Encounter for general adult medical examination without abnormal findings: Secondary | ICD-10-CM | POA: Diagnosis not present

## 2018-05-08 DIAGNOSIS — E785 Hyperlipidemia, unspecified: Secondary | ICD-10-CM | POA: Diagnosis not present

## 2018-05-08 DIAGNOSIS — IMO0001 Reserved for inherently not codable concepts without codable children: Secondary | ICD-10-CM

## 2018-05-08 LAB — COMPREHENSIVE METABOLIC PANEL
ALT: 15 U/L (ref 0–35)
AST: 17 U/L (ref 0–37)
Albumin: 4.3 g/dL (ref 3.5–5.2)
Alkaline Phosphatase: 96 U/L (ref 39–117)
BUN: 11 mg/dL (ref 6–23)
CALCIUM: 9.3 mg/dL (ref 8.4–10.5)
CHLORIDE: 101 meq/L (ref 96–112)
CO2: 32 meq/L (ref 19–32)
Creatinine, Ser: 0.65 mg/dL (ref 0.40–1.20)
GFR: 99.27 mL/min (ref >60.00)
Glucose, Bld: 90 mg/dL (ref 70–99)
POTASSIUM: 3.8 meq/L (ref 3.5–5.1)
Sodium: 142 mEq/L (ref 135–145)
Total Bilirubin: 0.3 mg/dL (ref 0.2–1.2)
Total Protein: 7 g/dL (ref 6.0–8.3)

## 2018-05-08 LAB — CBC WITH DIFFERENTIAL/PLATELET
Basophils Absolute: 0 10*3/uL (ref 0.0–0.1)
Basophils Relative: 0.4 % (ref 0.0–3.0)
EOS PCT: 1.7 % (ref 0.0–5.0)
Eosinophils Absolute: 0.1 10*3/uL (ref 0.0–0.7)
HEMATOCRIT: 45.9 % (ref 36.0–46.0)
Hemoglobin: 16.1 g/dL — ABNORMAL HIGH (ref 12.0–15.0)
LYMPHS ABS: 0.4 10*3/uL — AB (ref 0.7–4.0)
LYMPHS PCT: 8.3 % — AB (ref 12.0–46.0)
MCHC: 35 g/dL (ref 30.0–36.0)
MCV: 92.7 fl (ref 78.0–100.0)
Monocytes Absolute: 0.5 10*3/uL (ref 0.1–1.0)
Monocytes Relative: 9.9 % (ref 3.0–12.0)
NEUTROS ABS: 4.2 10*3/uL (ref 1.4–7.7)
NEUTROS PCT: 79.7 % — AB (ref 43.0–77.0)
PLATELETS: 184 10*3/uL (ref 150.0–400.0)
RBC: 4.95 Mil/uL (ref 3.87–5.11)
RDW: 12.9 % (ref 11.5–15.5)
WBC: 5.3 10*3/uL (ref 4.0–10.5)

## 2018-05-08 LAB — VITAMIN D 25 HYDROXY (VIT D DEFICIENCY, FRACTURES): VITD: 38.49 ng/mL (ref 30.00–100.00)

## 2018-05-08 LAB — HEMOGLOBIN A1C: Hgb A1c MFr Bld: 5.2 % (ref 4.6–6.5)

## 2018-05-08 LAB — LIPID PANEL
Cholesterol: 303 mg/dL — ABNORMAL HIGH (ref 0–200)
HDL: 51.7 mg/dL (ref >39.00)
NonHDL: 251.08
TRIGLYCERIDES: 361 mg/dL — AB (ref 0.0–149.0)
Total CHOL/HDL Ratio: 6
VLDL: 72.2 mg/dL — ABNORMAL HIGH (ref 0.0–40.0)

## 2018-05-08 LAB — LDL CHOLESTEROL, DIRECT: Direct LDL: 169 mg/dL

## 2018-05-08 LAB — TSH: TSH: 6.11 u[IU]/mL — ABNORMAL HIGH (ref 0.35–4.50)

## 2018-05-08 MED ORDER — ROSUVASTATIN CALCIUM 10 MG PO TABS: 10.0000 mg | ORAL_TABLET | Freq: Every day | ORAL | 3 refills | Status: DC

## 2018-05-08 NOTE — Progress Notes (Signed)
Subjective:    Patient ID: Debra Hogan, female    DOB: 09-Jan-1961, 57 y.o.   MRN: [id number]  CC: Debra Hogan is a 57 y.o. female who presents today for follow up.   HPI: Feeling well today No complaints  Here to discuss CT chest as did not receive results yet. Notes had been on cholesterol medication in the past.  H/o HTN- no medication at this time. Had been on medicaiton in the past.   COPD- sob at baseline. No wheezing, CP, increase of sputum.     CT screen chest  HISTORY:  Past Medical History:  Diagnosis Date  . Arthritis   . COPD (chronic obstructive pulmonary disease) (HCC)   . Depression   . Fibromyalgia   . Irritable bowel syndrome   . MS (multiple sclerosis) (HCC)    Followed by Dr. Sherryll Burger   Past Surgical History:  Procedure Laterality Date  . ABDOMINAL HYSTERECTOMY     CERVIX intact  . CHOLECYSTECTOMY N/A 01/03/2016   Procedure: LAPAROSCOPIC CHOLECYSTECTOMY WITH INTRAOPERATIVE CHOLANGIOGRAM;  Surgeon: Lattie Haw, MD;  Location: [facility] ORS;  Service: General;  Laterality: N/A;  . ENDOSCOPIC RETROGRADE CHOLANGIOPANCREATOGRAPHY (ERCP) WITH PROPOFOL N/A 01/04/2016   Procedure: ENDOSCOPIC RETROGRADE CHOLANGIOPANCREATOGRAPHY (ERCP) WITH PROPOFOL;  Surgeon: Midge Minium, MD;  Location: [facility] ENDOSCOPY;  Service: Endoscopy;  Laterality: N/A;  . FOOT SURGERY  2001  . LAPAROSCOPIC ENDOMETRIOSIS FULGURATION     Family History  Problem Relation Age of Onset  . Hypertension Mother   . Hyperlipidemia Mother   . Arthritis Mother   . Cancer Father        throat  . Heart disease Brother   . Bipolar disorder Son   . Schizophrenia Son   . Cancer Maternal Grandfather        liver  . Cancer Paternal Grandmother        liver    Allergies: Valium [diazepam] Current Outpatient Medications on File Prior to Visit  Medication Sig Dispense Refill  . acyclovir (ZOVIRAX) 400 MG tablet Take 1 tablet (400 mg total) by mouth daily. 90 tablet 3  . Albuterol Sulfate (PROAIR  RESPICLICK) 108 (90 Base) MCG/ACT AEPB Inhale 90 mcg into the lungs every 6 (six) hours as needed (as needed for cough, wheezing). 1 each 2  . ALPRAZolam (XANAX) 0.25 MG tablet Take 1 tablet (0.25 mg total) by mouth daily as needed for anxiety. 30 tablet 0  . baclofen (LIORESAL) 10 MG tablet   4  . carbamazepine (CARBATROL) 100 MG 12 hr capsule   1  . clopidogrel (PLAVIX) 75 MG tablet Take 75 mg by mouth.    . diclofenac sodium (VOLTAREN) 1 % GEL Apply 2 g topically 4 (four) times daily as needed (for pain). 1 Tube 4  . escitalopram (LEXAPRO) 10 MG tablet Take 1 tablet (10 mg total) daily by mouth. 90 tablet 1  . Fluticasone-Salmeterol (ADVAIR DISKUS) 100-50 MCG/DOSE AEPB Inhale 1 puff into the lungs 2 (two) times daily as needed (for shortness of breath). 60 each 3  . traMADol (ULTRAM) 50 MG tablet Take 1-2 tablets (50-100 mg total) by mouth every 8 (eight) hours as needed. 60 tablet 0  . lidocaine (LIDODERM) 5 % Place 1 patch onto the skin daily. Remove & Discard patch within 12 hours. (Patient not taking: Reported on 05/08/2018) 30 patch 1   No current facility-administered medications on file prior to visit.     Social History   Tobacco Use  . Smoking  status: Current Every Day Smoker    Packs/day: 1.00    Years: 43.00    Pack years: 43.00  . Smokeless tobacco: Never Used  Substance Use Topics  . Alcohol use: No    Alcohol/week: 0.0 oz  . Drug use: No    Review of Systems  Constitutional: Negative for chills and fever.  Respiratory: Positive for shortness of breath (chronic). Negative for cough and wheezing.   Cardiovascular: Negative for chest pain and palpitations.  Gastrointestinal: Negative for nausea and vomiting.      Objective:    BP 135/80   Pulse 68   Temp 98.4 F (36.9 C) (Oral)   Resp 16   Wt 159 lb 2 oz (72.2 kg)   BMI 26.48 kg/m  BP Readings from Last 3 Encounters:  05/08/18 135/80  04/22/18 124/82  03/29/18 (!) 144/92   Wt Readings from Last 3  Encounters:  05/08/18 159 lb 2 oz (72.2 kg)  04/30/18 158 lb (71.7 kg)  04/22/18 158 lb 8 oz (71.9 kg)    Physical Exam  Constitutional: She appears well-developed and well-nourished.  Eyes: Conjunctivae are normal.  Cardiovascular: Normal rate, regular rhythm, normal heart sounds and normal pulses.  Pulmonary/Chest: Effort normal and breath sounds normal. She has no wheezes. She has no rhonchi. She has no rales.  Neurological: She is alert.  Skin: Skin is warm and dry.  Psychiatric: She has a normal mood and affect. Her speech is normal and behavior is normal. Thought content normal.  Vitals reviewed.      Assessment & Plan:   Problem List Items Addressed This Visit      Cardiovascular and Mediastinum   Atherosclerosis of aorta (HCC) - Primary    Reviewed CT chest scan with patient today.  Discussed atherosclerosis seen on exam.  Started statin therapy. Pending lipid panel at this time.  Discussed incidental findings including the mild pericardial effusion seen on CT.  Reassured as patient does not have any acute symptoms today . No chest pain, worsening shortness of breath to warrant urgent evaluation.  At this time we discussed an evaluation of cardiology. Consult placed. Will follow      Relevant Medications   rosuvastatin (CRESTOR) 10 MG tablet   Other Relevant Orders   ECHOCARDIOGRAM COMPLETE   Ambulatory referral to Cardiology     Other   Lung nodule < 6cm on CT    Discussed lung nodules seen on CT chest.  Benign in appearance. patient is aware that annual CT scan is most important for her.      Routine physical examination   HLD (hyperlipidemia)    Start statin. Pending lipid panel.       Relevant Medications   rosuvastatin (CRESTOR) 10 MG tablet   Elevated blood pressure reading    Blood pressure had been initially elevated when patient came in the exam room.  Patient will monitor blood pressure at home.  Patient will send blood pressure log and we will decide  if she needs blood pressure medications at that point.           I am having Debra Hogan start on rosuvastatin. I am also having her maintain her clopidogrel, baclofen, carbamazepine, escitalopram, acyclovir, diclofenac sodium, traMADol, lidocaine, ALPRAZolam, Fluticasone-Salmeterol, and Albuterol Sulfate.   Meds ordered this encounter  Medications  . rosuvastatin (CRESTOR) 10 MG tablet    Sig: Take 1 tablet (10 mg total) by mouth daily.    Dispense:  90 tablet  Refill:  3    Order Specific Question:   Supervising Provider    Answer:   Sherlene Shams [[id number]]    Return precautions given.   Risks, benefits, and alternatives of the medications and treatment plan prescribed today were discussed, and patient expressed understanding.   Education regarding symptom management and diagnosis given to patient on AVS.  Continue to follow with Allegra Grana, FNP for routine health maintenance.   Debra Hogan and I agreed with plan.   Rennie Plowman, FNP

## 2018-05-08 NOTE — Assessment & Plan Note (Signed)
Blood pressure had been initially elevated when patient came in the exam room.  Patient will monitor blood pressure at home.  Patient will send blood pressure log and we will decide if she needs blood pressure medications at that point.

## 2018-05-08 NOTE — Assessment & Plan Note (Signed)
Reviewed CT chest scan with patient today.  Discussed atherosclerosis seen on exam.  Started statin therapy. Pending lipid panel at this time.  Discussed incidental findings including the mild pericardial effusion seen on CT.  Reassured as patient does not have any acute symptoms today . No chest pain, worsening shortness of breath to warrant urgent evaluation.  At this time we discussed an evaluation of cardiology. Consult placed. Will follow

## 2018-05-08 NOTE — Assessment & Plan Note (Signed)
Discussed lung nodules seen on CT chest.  Benign in appearance. patient is aware that annual CT scan is most important for her.

## 2018-05-08 NOTE — Patient Instructions (Addendum)
Monitor blood pressure,  Goal is less than 130/80; if persistently higher, please make sooner follow up appointment so we can recheck you blood pressure and manage medications  Start crestor  Today we discussed referrals, orders. CARDIOLOGY due to fluid seen around heart   I have placed these orders in the system for you.  Please be sure to give [place] a call if you have not heard from our office regarding scheduling a test or regarding referral in a timely manner.  It is very important that you let me know as soon as possible.

## 2018-05-08 NOTE — Assessment & Plan Note (Signed)
Start statin. Pending lipid panel.

## 2018-05-08 NOTE — Unmapped (Signed)
Left message to CB for pre-procedure instructions.

## 2018-05-09 ENCOUNTER — Ambulatory Visit: Admit: 2018-05-09 | Discharge: 2018-05-09 | Payer: MEDICARE

## 2018-05-09 DIAGNOSIS — I671 Cerebral aneurysm, nonruptured: Principal | ICD-10-CM

## 2018-05-09 NOTE — Progress Notes (Signed)
It's cancelled

## 2018-05-09 NOTE — Unmapped (Signed)
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 Arteriovenous shunting from right posterior cerebral artery. Please see finalized report in PACS    Attending physician:  Eston Esters    Resident physician:   Andie Mortimer    Complications:   NONE    Blood loss:  10 mL    Sheath:  removed    Method of hemostasis:  MynxGrip vascular closure device

## 2018-05-09 NOTE — Unmapped (Signed)
[place] Everything looks good Spoke to pt. looking at descending now -[employer]

## 2018-05-10 NOTE — Unmapped (Signed)
[facility] Refill Coordination Note  Specialty Medication(s): Tecfidera 240mg   Additional Medications shipped: none    Jennifer Lewis, DOB: February 14, 1961  Phone: [phone] (home) , Alternate phone contact: N/A  Phone or address changes today?: No  All above HIPAA information was verified with patient.  Shipping Address: [address]  [place] [address]   Insurance changes? No    Completed refill call assessment today to schedule patient's medication shipment from the [facility] [phone]).      Confirmed the medication and dosage are correct and have not changed: Yes, regimen is correct and unchanged.    Confirmed patient started or stopped the following medications in the past month:  No, there are no changes reported at this time.    Are you tolerating your medication?:  Jennifer Lewis reports tolerating the medication.    ADHERENCE    Did you miss any doses in the past 4 weeks? No missed doses reported.    FINANCIAL/SHIPPING    Delivery Scheduled: Yes, Expected medication delivery date: 05/14/18     The patient will receive an FSI print out for each medication shipped and additional FDA Medication Guides as required.  Patient education from [place] or [employer] Leu [place] also be included in the shipment    Jennifer Lewis did not have any additional questions at this time.    Delivery address validated in FSI scheduling system: Yes, address listed in FSI is correct.    We will follow up with patient monthly for standard refill processing and delivery.      Thank you,  Lupita Shutter   [facility] Specialty Pharmacist

## 2018-05-10 NOTE — Unmapped (Signed)
F/u call attempted. VM pick up. No message left.

## 2018-05-13 MED FILL — TECFIDERA/240MG/CPDR: TECFIDERA/240MG/CPDR | 30 days supply | Qty: 60 | Fill #1

## 2018-05-15 ENCOUNTER — Ambulatory Visit: Payer: Self-pay

## 2018-05-15 NOTE — Telephone Encounter (Signed)
Pt c/o coughing up blood. Pt stated that she began coughing up blood 1 month ago and has coughed up blood x 5 times. Amounts of blood range from a trace to 1/2 teaspoon. Pt states on the other days she has been coughing up gray to clear phlegm. Pt states she has chest pain and abdominal pain at baseline and contributes the pain to her fibromyalgia. Disposition is to see physician within 24 hours. No openings available until Friday. Appt made with PCP Friday. Care advice given.  Reason for Disposition . [1] Coughing up yellow or green sputum AND [2] present > 5 days  Answer Assessment - Initial Assessment Questions 1. ONSET: "When did you start coughing up blood?"     1 month 2. SEVERITY: "How many times?" "How much blood?" (e.g., flecks, streaks, tablespoons, etc)     4 - trace to 1/2 teaspoon 3. COUGHING SPASMS: "Did the blood appear after a coughing spell?"       no 4. RESPIRATORY DISTRESS: "Describe your breathing."      Pt with COPD 5. FEVER: "Do you have a fever?" If so, ask: "What is your temperature, how was it measured, and when did it start?"     no 6. SPUTUM: "Describe the color of your sputum" (clear, white, yellow, green), "Has there been any change recently?"     Gray to clear  7. CARDIAC HISTORY: "Do you have any history of heart disease?" (e.g., heart attack, congestive heart failure)      no 8. LUNG HISTORY: "Do you have any history of lung disease?"  (e.g., pulmonary embolus, asthma, emphysema)     COPD 9. PE RISK FACTORS: "Do you have a history of blood clots?" (or: recent major surgery, recent prolonged travel, bedridden )     no 10. OTHER SYMPTOMS: "Do you have any other symptoms?" (e.g., nosebleed, chest pain, abdominal pain, vomiting)       Chest pain and abdominal pain 11. PREGNANCY: "Is there any chance you are pregnant?" "When was your last menstrual period?"       n/a 12. TRAVEL: "Have you traveled out of the country in the last month?" (e.g., travel history,  exposures)       no  Protocols used: COUGHING UP BLOOD-A-AH

## 2018-05-15 NOTE — Unmapped (Signed)
Patient called today and would like to know the plan now that she has done her angiogram. Thanks!

## 2018-05-15 NOTE — Unmapped (Signed)
I notified patient that report is not yet finalized and someone will be in touch with her soon.

## 2018-05-17 ENCOUNTER — Other Ambulatory Visit: Payer: Self-pay | Admitting: Family

## 2018-05-17 ENCOUNTER — Telehealth: Payer: Self-pay | Admitting: Family

## 2018-05-17 ENCOUNTER — Ambulatory Visit: Payer: Self-pay | Admitting: Family

## 2018-05-17 DIAGNOSIS — I700 Atherosclerosis of aorta: Secondary | ICD-10-CM

## 2018-05-17 DIAGNOSIS — R7989 Other specified abnormal findings of blood chemistry: Secondary | ICD-10-CM

## 2018-05-17 NOTE — Telephone Encounter (Signed)
Patient is calling to cancel her appointment for cough. She state she started having diarrhea around 11:00 today and she can't stay out of the bathroom. She is using Imodium now to try to calm things down. Patient states she has not seen blood in her sputum since 5/22- patient states her cough is more dry now- she is still coughing mucus up. She is moving mucus- but she is not using OTC treatment. Increase fluids- start Mucinex. Patient rescheduled for next week.

## 2018-05-21 ENCOUNTER — Encounter: Payer: Self-pay | Admitting: Family Medicine

## 2018-05-21 ENCOUNTER — Ambulatory Visit (INDEPENDENT_AMBULATORY_CARE_PROVIDER_SITE_OTHER): Payer: Medicare Other | Admitting: Family Medicine

## 2018-05-21 VITALS — BP 144/84 | HR 100 | Temp 98.6°F | Wt 159.4 lb

## 2018-05-21 DIAGNOSIS — J441 Chronic obstructive pulmonary disease with (acute) exacerbation: Secondary | ICD-10-CM

## 2018-05-21 MED ORDER — AZITHROMYCIN 250 MG PO TABS: ORAL_TABLET | ORAL | 0 refills | Status: DC

## 2018-05-21 MED ORDER — PREDNISONE 20 MG PO TABS: ORAL_TABLET | ORAL | 0 refills | Status: DC

## 2018-05-21 NOTE — Progress Notes (Signed)
Subjective:    Patient ID: Debra Hogan, female    DOB: 13-Apr-1961, 57 y.o.   MRN: [id number]  HPI  Debra Hogan is a 57 year old female who presents today with a cough that has been present for approximately one month.  She reports that about one month ago she coughed up blood for a period of two weeks on three occasions which occurred prior to recent lung cancer screening CT scan.  She states that this improved and  reports that she noticed this three times with amount of blood estimated at approximately one tsp, then 1/2 tsp, then trace, then disappeared. During this time, she stated that sputum was mixed with white and green sputum.  Associated symptoms: Dyspnea: Increase in dyspnea, no wheezing,  increase in sputum that is purulent. Mucinex has provided benefit Fever: No Chills: No Sweats: No Rash: No Hematuria: No Joint Pain/swelling: No No history of recent antibiotic use No recent sick contacts  She is using Advair as directed without adverse effects. She uses albuterol if she is exposed to irritants such as cologne. She has used albuterol once this past month which provided benefit.  History of COPD, MS,  and she is a current smoker with a 43 pack year history. CT chest lung cancer screening on 04/30/18 revealed Lung-RADS, benign appearance or behavior, small pericardial effusion/thickening, and she was advised to continue annual screenings.   Review of Systems  Constitutional: Negative for chills, fatigue and fever.  HENT: Negative for congestion, postnasal drip, rhinorrhea, sinus pressure and sinus pain.   Respiratory: Positive for cough and shortness of breath. Negative for wheezing.   Cardiovascular: Negative for chest pain and palpitations.  Gastrointestinal: Negative for abdominal pain, diarrhea, nausea and vomiting.  Genitourinary: Negative for hematuria.  Skin: Negative for pallor and rash.  Neurological: Negative for dizziness, weakness and headaches.   Past Medical  History:  Diagnosis Date  . Arthritis   . COPD (chronic obstructive pulmonary disease) (HCC)   . Depression   . Fibromyalgia   . Irritable bowel syndrome   . MS (multiple sclerosis) (HCC)    Followed by Dr. Sherryll Burger     Social History   Socioeconomic History  . Marital status: Married    Spouse name: Not on file  . Number of children: Not on file  . Years of education: Not on file  . Highest education level: Not on file  Occupational History  . Not on file  Social Needs  . Financial resource strain: Not hard at all  . Food insecurity:    Worry: Never true    Inability: Never true  . Transportation needs:    Medical: No    Non-medical: No  Tobacco Use  . Smoking status: Current Every Day Smoker    Packs/day: 1.00    Years: 43.00    Pack years: 43.00  . Smokeless tobacco: Never Used  Substance and Sexual Activity  . Alcohol use: No    Alcohol/week: 0.0 oz  . Drug use: No  . Sexual activity: Not Currently  Lifestyle  . Physical activity:    Days per week: Not on file    Minutes per session: Not on file  . Stress: Not on file  Relationships  . Social connections:    Talks on phone: Not on file    Gets together: Not on file    Attends religious service: Not on file    Active member of club or organization: Not on file  Attends meetings of clubs or organizations: Not on file    Relationship status: Not on file  . Intimate partner violence:    Fear of current or ex partner: No    Emotionally abused: No    Physically abused: No    Forced sexual activity: No  Other Topics Concern  . Not on file  Social History Narrative   Lives in [place] with son. Widowed x 2.      Married for 10 years.       On disability       Past Surgical History:  Procedure Laterality Date  . ABDOMINAL HYSTERECTOMY     CERVIX intact  . CHOLECYSTECTOMY N/A 01/03/2016   Procedure: LAPAROSCOPIC CHOLECYSTECTOMY WITH INTRAOPERATIVE CHOLANGIOGRAM;  Surgeon: Lattie Haw, MD;   Location: [facility] ORS;  Service: General;  Laterality: N/A;  . ENDOSCOPIC RETROGRADE CHOLANGIOPANCREATOGRAPHY (ERCP) WITH PROPOFOL N/A 01/04/2016   Procedure: ENDOSCOPIC RETROGRADE CHOLANGIOPANCREATOGRAPHY (ERCP) WITH PROPOFOL;  Surgeon: Midge Minium, MD;  Location: [facility] ENDOSCOPY;  Service: Endoscopy;  Laterality: N/A;  . FOOT SURGERY  2001  . LAPAROSCOPIC ENDOMETRIOSIS FULGURATION      Family History  Problem Relation Age of Onset  . Hypertension Mother   . Hyperlipidemia Mother   . Arthritis Mother   . Cancer Father        throat  . Heart disease Brother   . Bipolar disorder Son   . Schizophrenia Son   . Cancer Maternal Grandfather        liver  . Cancer Paternal Grandmother        liver    Allergies  Allergen Reactions  . Valium [Diazepam] Anxiety    Current Outpatient Medications on File Prior to Visit  Medication Sig Dispense Refill  . acyclovir (ZOVIRAX) 400 MG tablet Take 1 tablet (400 mg total) by mouth daily. 90 tablet 3  . Albuterol Sulfate (PROAIR RESPICLICK) 108 (90 Base) MCG/ACT AEPB Inhale 90 mcg into the lungs every 6 (six) hours as needed (as needed for cough, wheezing). 1 each 2  . ALPRAZolam (XANAX) 0.25 MG tablet Take 1 tablet (0.25 mg total) by mouth daily as needed for anxiety. 30 tablet 0  . aspirin 81 MG chewable tablet Chew by mouth.    . baclofen (LIORESAL) 10 MG tablet   4  . baclofen (LIORESAL) 10 MG tablet Take by mouth.    . carbamazepine (CARBATROL) 100 MG 12 hr capsule   1  . escitalopram (LEXAPRO) 10 MG tablet Take 1 tablet (10 mg total) daily by mouth. 90 tablet 1  . Fluticasone-Salmeterol (ADVAIR DISKUS) 100-50 MCG/DOSE AEPB Inhale 1 puff into the lungs 2 (two) times daily as needed (for shortness of breath). 60 each 3  . ondansetron (ZOFRAN-ODT) 4 MG disintegrating tablet Take by mouth.    . rosuvastatin (CRESTOR) 10 MG tablet Take 1 tablet (10 mg total) by mouth daily. 90 tablet 3  . TECFIDERA 240 MG CPDR     . traMADol (ULTRAM) 50 MG tablet  Take 1-2 tablets (50-100 mg total) by mouth every 8 (eight) hours as needed. 60 tablet 0  . zolpidem (AMBIEN) 5 MG tablet Take by mouth.    . diclofenac sodium (VOLTAREN) 1 % GEL Apply 2 g topically 4 (four) times daily as needed (for pain). (Patient not taking: Reported on 05/21/2018) 1 Tube 4  . lidocaine (LIDODERM) 5 % Place 1 patch onto the skin daily. Remove & Discard patch within 12 hours. (Patient not taking: Reported on 05/08/2018) 30 patch  1   No current facility-administered medications on file prior to visit.     BP (!) 144/84 (BP Location: Left Arm, Patient Position: Sitting, Cuff Size: Normal)   Pulse 100   Temp 98.6 F (37 C) (Oral)   Wt 159 lb 6 oz (72.3 kg)   SpO2 96%   BMI 26.52 kg/m       Objective:   Physical Exam  Constitutional: She is oriented to person, place, and time. She appears well-developed and well-nourished.  HENT:  Right Ear: Tympanic membrane normal.  Left Ear: Tympanic membrane normal.  Nose: Right sinus exhibits no maxillary sinus tenderness and no frontal sinus tenderness. Left sinus exhibits no maxillary sinus tenderness and no frontal sinus tenderness.  Mouth/Throat: Oropharynx is clear and moist and mucous membranes are normal.  Eyes: Pupils are equal, round, and reactive to light. No scleral icterus.  Neck: Neck supple.  Cardiovascular: Normal rate, regular rhythm, normal heart sounds and intact distal pulses.  Pulmonary/Chest: Effort normal and breath sounds normal. No respiratory distress. She has no wheezes. She has no rales.  Abdominal: Soft. Bowel sounds are normal. There is no tenderness.  Lymphadenopathy:    She has no cervical adenopathy.  Neurological: She is alert and oriented to person, place, and time.  Skin: Skin is warm and dry. No rash noted.  Psychiatric: She has a normal mood and affect. Her behavior is normal. Judgment and thought content normal.        Assessment & Plan:  1. COPD with exacerbation (HCC) Symptoms are  most consistent with early exacerbation of COPD. Increased dyspnea, productive cough and purulence in sputum are present. No respiratory distress present today and no difficulty speaking. Prior episode of blood in sputum has resolved per patient and was noted prior to lung screening CT. Patient is not interested in further imaging today with recent findings on lung cancer screening.   No recent exacerbations or antibiotic use noted. We discussed treatment options and opted to initiate prednisone burst and azithromycin based upon recent history. We reviewed use of albuterol as a rescue inhaler and she voiced understanding. She will continue her current regimen and follow up with PCP if symptoms do not improve with treatment, worsen, or new symptoms develop. We reviewed the importance of tobacco cessation and she is not interested at this time. Offered resources for cessation if she is interested in quitting. Return precautions provided.  - azithromycin (ZITHROMAX) 250 MG tablet; Take 2 tablets by mouth at once today, then one tablet daily x 4 days.  Dispense: 6 tablet; Refill: 0 - predniSONE (DELTASONE) 20 MG tablet; Take 2 tablets once daily for 5 days  Dispense: 10 tablet; Refill: 0  Roddie Mc, FNP-C

## 2018-05-21 NOTE — Patient Instructions (Signed)
Please take medication as directed and follow up with Claris Che if symptoms do not improve with treatment, worsen, or new symptoms develop.  If no improvement, imaging can be considered as we discussed.   Chronic Obstructive Pulmonary Disease Exacerbation Chronic obstructive pulmonary disease (COPD) is a common lung problem. In COPD, the flow of air from the lungs is limited. COPD exacerbations are times that breathing gets worse and you need extra treatment. Without treatment they can be life threatening. If they happen often, your lungs can become more damaged. If your COPD gets worse, your doctor Sofia treat you with:  Medicines.  Oxygen.  Different ways to clear your airway, such as using a mask.  Follow these instructions at home:  Do not smoke.  Avoid tobacco smoke and other things that bother your lungs.  If given, take your antibiotic medicine as told. Finish the medicine even if you start to feel better.  Only take medicines as told by your doctor.  Drink enough fluids to keep your pee (urine) clear or pale yellow (unless your doctor has told you not to).  Use a cool mist machine (vaporizer).  If you use oxygen or a machine that turns liquid medicine into a mist (nebulizer), continue to use them as told.  Keep up with shots (vaccinations) as told by your doctor.  Exercise regularly.  Eat healthy foods.  Keep all doctor visits as told. Get help right away if:  You are very short of breath and it gets worse.  You have trouble talking.  You have bad chest pain.  You have blood in your spit (sputum).  You have a fever.  You keep throwing up (vomiting).  You feel weak, or you pass out (faint).  You feel confused.  You keep getting worse. This information is not intended to replace advice given to you by your health care provider. Make sure you discuss any questions you have with your health care provider. Document Released: 11/30/2011 Document Revised:  05/18/2016 Document Reviewed: 08/15/2013 Elsevier Interactive Patient Education  2017 [employer].

## 2018-05-22 ENCOUNTER — Encounter: Payer: Self-pay | Admitting: Family

## 2018-05-22 NOTE — Telephone Encounter (Signed)
Pt called and wanted to speak w/ her pcp or her nurse, call back when possible

## 2018-05-22 NOTE — Telephone Encounter (Signed)
Close  Noted and have sent mychart message

## 2018-05-23 ENCOUNTER — Telehealth: Payer: Self-pay

## 2018-05-23 NOTE — Telephone Encounter (Signed)
Copied from CRM [id number]. Topic: Quick Communication - See Telephone Encounter >> Galluzzo 29, 2019  4:29 PM Herby Abraham C wrote: CRM for notification. See Telephone encounter for: 05/22/18.  Pt called in to be advised. Pt says that she was prescribed prednisone, after reading into medication pt says that it contain ingredients that will effect her MS (nerves in her eye) pt says that she is not going to use medication and would like to be advised on something else that she could use?    CB

## 2018-05-23 NOTE — Telephone Encounter (Signed)
Advise completing antibiotic and she can use her albuterol inhaler for episodes of shortness of breath as prescribed if needed. If SOB does not improve or worsens, advise follow up for further evaluation.

## 2018-05-24 NOTE — Telephone Encounter (Signed)
Mychart message sent.

## 2018-05-30 NOTE — Unmapped (Signed)
Patient called and spoke with scheduling today and has not heard about the angio results or plan yet. Please advise.

## 2018-06-05 NOTE — Unmapped (Signed)
I called patient and notified that the plan is to treat the aneurysm endovascularly and I provided VIR number for her to call.

## 2018-06-10 NOTE — Unmapped (Signed)
[facility] Refill Coordination Note    Specialty Medication(s) to be Shipped:   Neurology: Tecfidera 240MG      Nimrit D Laffey, DOB: 09-09-1961  Phone: [phone] (home)   Shipping Address: [address]  [place] [address]    All above HIPAA information was verified with patient.     Completed refill call assessment today to schedule patient's medication shipment from the [facility] [phone]).       Specialty medication(s) and dose(s) confirmed: Regimen is correct and unchanged.   Changes to medications: Helina reports no changes reported at this time.  Changes to insurance: No  Questions for the pharmacist: No    The patient will receive an FSI print out for each medication shipped and additional FDA Medication Guides as required.  Patient education from [place] or [employer] Leu [employer]ch also be included in the shipment.    DISEASE-SPECIFIC INFORMATION        N/A    ADHERENCE     Medication Adherence    Patient reported X missed doses in the last month:  0  Specialty Medication:  Tecfidera 240mg   Reasons for non-adherence:  patient forgets  Adherence tools used:  patient uses a pill box to manage medications          MEDICARE PART B DOCUMENTATION     Not Applicable    SHIPPING     Shipping address confirmed in FSI.     Delivery Scheduled: Yes, Expected medication delivery date: 06/13/2018 via UPS or courier.     Skylynn Burkley Leodis Binet   [facility] Specialty Technician

## 2018-06-12 MED FILL — TECFIDERA/240MG/CPDR: TECFIDERA/240MG/CPDR | 30 days supply | Qty: 60 | Fill #2

## 2018-06-18 ENCOUNTER — Telehealth: Payer: Self-pay | Admitting: Family

## 2018-06-18 DIAGNOSIS — F411 Generalized anxiety disorder: Secondary | ICD-10-CM

## 2018-06-18 NOTE — Telephone Encounter (Signed)
Copied from CRM [id number]. Topic: Quick Communication - Rx Refill/Question >> Jun 18, 2018  1:17 PM Raoul Pitch, Sade R wrote: Medication: escitalopram (LEXAPRO) 10 MG tablet  Has the patient contacted their pharmacy? yes (Agent: If no, request that the patient contact the pharmacy for the refill.) (Agent: If yes, when and what did the pharmacy advise?)  Preferred Pharmacy (with phone number or street name):   [facility] [address], [place] - [address] [phone] (Phone) [phone] (Fax)      Agent: Please be advised that RX refills Ur[facility] take up to 3 business days. We ask that you follow-up with your pharmacy.

## 2018-06-19 MED ORDER — ESCITALOPRAM OXALATE 10 MG PO TABS: 10.0000 mg | ORAL_TABLET | Freq: Every day | ORAL | 1 refills | Status: DC

## 2018-07-05 NOTE — Unmapped (Signed)
Left message to CB for pre-procedure instructions.

## 2018-07-08 DIAGNOSIS — I671 Cerebral aneurysm, nonruptured: Principal | ICD-10-CM

## 2018-07-08 NOTE — Unmapped (Signed)
Attempted to reach pt to review pre-procedure instructions. LM requesting CB

## 2018-07-09 ENCOUNTER — Ambulatory Visit
Admit: 2018-07-09 | Discharge: 2018-07-18 | Disposition: A | Discharge status: Home Health | Payer: MEDICARE | Admitting: Neurological Surgery

## 2018-07-09 ENCOUNTER — Encounter
Admit: 2018-07-09 | Discharge: 2018-07-18 | Disposition: A | Discharge status: Home Health | Payer: MEDICARE | Attending: Certified Registered" | Admitting: Neurological Surgery

## 2018-07-09 DIAGNOSIS — I729 Aneurysm of unspecified site: Secondary | ICD-10-CM

## 2018-07-09 DIAGNOSIS — I671 Cerebral aneurysm, nonruptured: Principal | ICD-10-CM

## 2018-07-09 LAB — CBC
HEMATOCRIT: 37.4 % (ref 36.0–46.0)
HEMOGLOBIN: 12.4 g/dL (ref 12.0–16.0)
MEAN CORPUSCULAR HEMOGLOBIN CONC: 33.2 g/dL (ref 31.0–37.0)
MEAN CORPUSCULAR VOLUME: 94.9 fL (ref 80.0–100.0)
MEAN PLATELET VOLUME: 8.1 fL (ref 7.0–10.0)
PLATELET COUNT: 185 10*9/L (ref 150–440)
RED CELL DISTRIBUTION WIDTH: 12.9 % (ref 12.0–15.0)
WBC ADJUSTED: 6.7 10*9/L (ref 4.5–11.0)

## 2018-07-09 LAB — BASIC METABOLIC PANEL
ANION GAP: 1 mmol/L — ABNORMAL LOW (ref 9–15)
BLOOD UREA NITROGEN: 10 mg/dL (ref 7–21)
BUN / CREAT RATIO: 19
CHLORIDE: 111 mmol/L — ABNORMAL HIGH (ref 98–107)
CO2: 28 mmol/L (ref 22.0–30.0)
CREATININE: 0.53 mg/dL — ABNORMAL LOW (ref 0.60–1.00)
EGFR CKD-EPI NON-AA FEMALE: >90 mL/min/{1.73_m2} (ref >=60)
GLUCOSE RANDOM: 135 mg/dL — ABNORMAL HIGH (ref 65–99)
POTASSIUM: 3.8 mmol/L (ref 3.5–5.0)
SODIUM: 140 mmol/L (ref 135–145)

## 2018-07-09 LAB — HEMATOCRIT: Lab: 37.4

## 2018-07-09 LAB — SODIUM: Sodium:SCnc:Pt:Ser/Plas:Qn:: 140

## 2018-07-09 NOTE — Unmapped (Signed)
HPI: Jennifer Lewis is a 57 y.o. female with hx of aneurysms s.p pipeline embo on left ophthalmic aneurysm and now here for embo of right cavernous ICA     Allergies:   Allergies   Allergen Reactions   ??? Diazepam Anxiety       Medications:   Medications Prior to Admission   Medication Sig Dispense Refill Last Dose   ??? acyclovir (ZOVIRAX) 400 MG tablet Take by mouth daily.    Taking   ??? aspirin 81 MG chewable tablet Chew 1 tablet (81 mg total) daily. 30 tablet 0 Taking   ??? baclofen (LIORESAL) 10 MG tablet Take 1 tablet (10 mg total) by mouth Three (3) times a day as needed for muscle spasms. 30 tablet 5 05/09/2018 at 0200   ??? carBAMazepine (CARBATROL) 200 MG 12 hr capsule Take 1 capsule (200 mg total) by mouth three (3) times a day. 270 capsule 1 05/09/2018 at 0200   ??? cholecalciferol, vitamin D3, (VITAMIN D3) 1,000 unit capsule Take 1,000 Units by mouth daily.   Taking   ??? dimethyl fumarate (TECFIDERA) 240 mg CpDR Take 1 capsule (240 mg total) by mouth Two (2) times a day with meals. 60 capsule 2 05/09/2018 at 0200   ??? escitalopram oxalate (LEXAPRO) 10 MG tablet Take 10 mg by mouth.   Taking   ??? fluticasone-salmeterol (ADVAIR) 100-50 mcg/dose diskus Inhale 1 puff Two (2) times a day.   Taking   ??? ondansetron (ZOFRAN-ODT) 4 MG disintegrating tablet Take 4 mg by mouth every six (6) hours as needed for nausea.   Taking   ??? zolpidem (AMBIEN) 5 MG tablet Take 1 tablet (5 mg total) by mouth nightly as needed for sleep. 30 tablet 0        Physical Exam:  There were no vitals filed for this visit.  ASA Grade: ASA 2 - Patient with mild systemic disease with no functional limitations  Airway assessment: Class 1 - Can visualize soft palate, fauces, uvula, and tonsillar pillars  Lungs: Respirations nonlabored    Lab Results   Component Value Date    PLT 201 01/02/2018       Assessment/Plan:    Jennifer Lewis is a 57 y.o. female who will undergo for embo of right cavernous ICA  in Neurointerventional Radiology.    -This procedure has been fully reviewed with the patient/patient???s authorized representative. The risks, benefits and alternatives have been explained, and the patient has consented to the procedure.  -The patient accepts blood products in an emergent situation.  -The patient does not have a Do Not Resuscitate (DNR) order in effect.      Michaelene Song, MD FACP  [facility] Fellow

## 2018-07-09 NOTE — Unmapped (Signed)
NEUROSURGERY HISTORY AND PHYSICAL    Assessment and Plan  Jennifer Lewis is a 57 y.o. female with history of left ophthalmic aneurysm s/p pipeline embolization (04/25/2017) now s/p coil embolization of right cavernous ICA aneurysm.    - ICU overnight for close neuromonitoring.   - continue home dose aspirin tomorrow  - Na>135  - SBP<180  - likely discharge in the morning    Patient was discussed with Haywood Filler, MD and staffed with Johnathan Hausen. Sasaki-Adams, MD.     Problem List  Active Problems:    * No active hospital problems. *  Resolved Problems:    * No resolved hospital problems. *      History of Present Illness  Jennifer Lewis is a 58 y.o.  female who presents for elective coil embolization with a history of left ophthalmic aneurysm s/p pipeline embolization (04/25/2017) now s/p coil embolization of right cavernous ICA aneurysm.    Review of Systems  A 10-system review of systems was conducted and was negative except as documented above in the HPI.    ______________________________________________________________________    Physical Exam  General: No acute distress; There is no height or weight on file to calculate BMI.    Neurological Exam:  AOx3  PERRL  EOMI  F=  TML  BUE 5/5  RLE 5/5 distally  LLE 5/5  SILT  DP 2+  Groin site flat      Cardiovascular: Warm and well perfused with good pulses, no edema  Pulmonary: Unlabored breathing, no pursed lips or wheezing, acyanotic  Abdomen: Soft, nontender, nondistended    Neurological imaging reviewed  IR cerebral embolization shows coil embolization of right ICA aneurysm    The patient's vitals, intake/output, medications, labs, and relevant neurological imaging were independently reviewed.    ---Historical data---    Allergies  Diazepam    Medications  Updated and/or reviewed in Epic.  Medications relevant to this initial evaluation listed below.        Past Medical History  Past Medical History:   Diagnosis Date   ??? Aneurysm (CMS-HCC)    ??? Anxiety    ??? COPD (chronic obstructive pulmonary disease) (CMS-HCC)    ??? Depression    ??? Fibromyalgia    ??? Fibromyalgia, primary 2011   ??? Headache, tension-type 2011    Worse and more frequent in the past 4-5 months.   ??? MS (multiple sclerosis) (CMS-HCC)    ??? Restless leg syndrome    ??? Sleep apnea, obstructive 2016    Sleep study done.  Used a CPAP.   ??? Stroke (CMS-HCC) 2005       Past Surgical History  Patient denies past surgical history.    Family History  Family History   Problem Relation Age of Onset   ??? Cancer Father    ??? Hypertension Brother    ??? Heart disease Brother    ??? Cancer Maternal Grandfather    ??? Cancer Paternal Grandfather    ??? Cancer Paternal Uncle    ??? COPD Brother    ??? Heart attack Brother    ??? Stroke Mother    ??? Stroke Maternal Grandmother    ??? Thyroid disease Maternal Grandmother    ??? Multiple sclerosis Neg Hx    ??? Aneurysm Neg Hx        Social History  Social History     Tobacco Use   ??? Smoking status: Current Every Day Smoker     Packs/day: 1.00  Years: 36.00     Pack years: 36.00     Types: Cigarettes   ??? Smokeless tobacco: Never Used   Substance Use Topics   ??? Alcohol use: No     Alcohol/week: 0.0 oz   ??? Drug use: No

## 2018-07-09 NOTE — Unmapped (Signed)
Procedure:   Embolization of  right ICA (communicating segment ) aneurysm    Vascular Access:  Right femoral artery    Vessels injected:  right internal carotid artery    Preliminary findings:  Coil embolization of  right ICA (communicating segment ) aneurysm. Please see finalized report in PACS    Attending physician:  Eston Esters    Resident physician:   Dillon Bjork    Complications:   NONE    Blood loss:  10 mL    Sheath:  removed    Method of hemostasis:  AngioSeal vascular closure device     Note 5000 units of heparin was given intra op      Michaelene Song, MD Jerrel Ivory   [facility] fellow

## 2018-07-09 NOTE — Unmapped (Signed)
Neurocritical Care   Admission Note     CODE STATUS:  FULL    Date of service: 07/09/2018  Hospital Day:  LOS: 0 days        HPI     Jennifer Lewis is a 57 y.o. female with PMH Right cavernous internal carotid artery aneurysm. Left ophthalmic aneurysm s/p pipeline 04/2017.  Right cavernous ICA aneurysm has been routinely monitored.  Now PED #0 from coil embolization of Right ICA aneurysm.      History     PAST MEDICAL HISTORY  Past Medical History:   Diagnosis Date   ??? Aneurysm (CMS-HCC)    ??? Anxiety    ??? COPD (chronic obstructive pulmonary disease) (CMS-HCC)    ??? Depression    ??? Fibromyalgia    ??? Fibromyalgia, primary 2011   ??? Headache, tension-type 2011    Worse and more frequent in the past 4-5 months.   ??? MS (multiple sclerosis) (CMS-HCC)    ??? Restless leg syndrome    ??? Sleep apnea, obstructive 2016    Sleep study done.  Used a CPAP.   ??? Stroke (CMS-HCC) 2005       SURGICAL HISTORY  Past Surgical History:   Procedure Laterality Date   ??? CHOLECYSTECTOMY     ??? colonoscopy     ??? HYSTERECTOMY     ??? IR EMBOLIZATION INTRACRANIAL SPINAL  05/17/2017    IR EMBOLIZATION INTRACRANIAL SPINAL 05/17/2017 Altamease Oiler, MD IMG [facility]IR [facility]       HOME MEDICATIONS  No current facility-administered medications on file prior to encounter.      Current Outpatient Medications on File Prior to Encounter   Medication Sig   ??? acyclovir (ZOVIRAX) 400 MG tablet Take by mouth daily.    ??? aspirin 81 MG chewable tablet Chew 1 tablet (81 mg total) daily.   ??? carBAMazepine (TEGRETOL) 200 mg tablet Take 200 mg by mouth Three (3) times a day.   ??? cholecalciferol, vitamin D3, (VITAMIN D3) 1,000 unit capsule Take 1,000 Units by mouth daily.   ??? dimethyl fumarate (TECFIDERA) 240 mg CpDR Take 1 capsule (240 mg total) by mouth Two (2) times a day with meals.   ??? escitalopram oxalate (LEXAPRO) 10 MG tablet Take 10 mg by mouth.   ??? fluticasone-salmeterol (ADVAIR) 100-50 mcg/dose diskus Inhale 1 puff Two (2) times a day.   ??? rosuvastatin (CRESTOR) 10 MG tablet Take 10 mg by mouth daily.   ??? baclofen (LIORESAL) 10 MG tablet Take 1 tablet (10 mg total) by mouth Three (3) times a day as needed for muscle spasms.   ??? ondansetron (ZOFRAN-ODT) 4 MG disintegrating tablet Take 4 mg by mouth every six (6) hours as needed for nausea.       Allergies  Diazepam    Family History  Family History   Problem Relation Age of Onset   ??? Cancer Father    ??? Hypertension Brother    ??? Heart disease Brother    ??? Cancer Maternal Grandfather    ??? Cancer Paternal Grandfather    ??? Cancer Paternal Uncle    ??? COPD Brother    ??? Heart attack Brother    ??? Stroke Mother    ??? Stroke Maternal Grandmother    ??? Thyroid disease Maternal Grandmother    ??? Multiple sclerosis Neg Hx    ??? Aneurysm Neg Hx        Social History  Social History     Tobacco Use   ???  Smoking status: Current Every Day Smoker     Packs/day: 1.00     Years: 36.00     Pack years: 36.00     Types: Cigarettes   ??? Smokeless tobacco: Never Used   Substance Use Topics   ??? Alcohol use: No     Alcohol/week: 0.0 oz   ??? Drug use: No         Review of Systems     A comprehensive review of systems was negative.       Assessment and Plan     Jennifer Lewis is a 57 y.o. female admitted to NSICU with a diagnosis of ICA aneurysm (unruptured).       Neuro:  Daily interruption of sedation: N/A  Multi-Modality Monitoring: none  Current anticonvulsants: not indicated.  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     **Pain/sedation  - tylenol prn mild pain  - fentanyl prn severe pain      No results in the last day      Cardiovascular:  Admission weight: 72  Daily     A BP-1: (114-122)/(54-64) 122/64  MAP:  [72 mmHg-84 mmHg] 84 mmHg  Cardiac enzymes: No results in the last day      **Hemodynamic goals  - Current vasoactive medications:  None  - MAP > 65  - goal SBP < 180 to reduce risk of post-op hemorrhage  - labetolol/hydralazine prn    **Hyperlipidemia  - Continue  statin        Heme  Current DVT prophylaxis: SCDs,     - check post-op and daily CBC  - hold chemical DVT prophylaxis until POD#2 due to high risk of hemorrhage     No results in the last week  Lab Results   Component Value Date    INR 1.14 05/17/2017    APTT 270.0 (HH) 05/17/2017         Pulmonary  The HOB is elevated to greater than 30 degrees.  O2/Vent Settings:   O2 Flow Rate (L/min):  [6 L/min] 6 L/min      **COPD   - Stable on RA  - IS q2 while awake  - Titrate O2 for sat > 92%  - Scheduled Advair, prn Albuterol          **Tobacco use  - smoking cessation counseling      GI      There is no height or weight on file to calculate BMI.    GI prophylaxis: not indicated  The patient's last recorded bowel movement: PTA  Bowel regimen: start colace when taking PO  Current nutritional source: Regular diet      **Nutrition  - start oral diet  - bedside dysphagia screen  - advance diet as tolerated     **Nausea  - zofran prn    Most recent LFTs:  Lab Results   Component Value Date  AST 33 01/02/2018    ALT 29 01/02/2018    ALKPHOS 106 01/02/2018    BILITOT 0.7 01/02/2018    PROT 7.7 01/02/2018    ALBUMIN 4.5 01/02/2018           Renal/Endocrine    Intake/Output Summary (Last 24 hours) at 07/09/2018 1347  Last data filed at 07/09/2018 1320  Gross per 24 hour   Intake 1420 ml   Output 652 ml   Net 768 ml     The patient current insulin regimen for blood glucose control:  SSI  Foley catheter: Foley to be removed today.      - obtain daily chemistry and supplement electrolytes as needed     **Glycemic control  - start FSBS and SSI to prevent hyperglycemia and to assess insulin needs      **Fluids  - goal euvolemia, NS at 75 cc/hour   - medlock IVF when adequate PO  - foley now for strict ins/outs, d/c foley on POD#1      No results in the last week  No results found for: A1C  Potassium   Date Value Ref Range Status   05/18/2017 4.2 3.5 - 5.0 mmol/L Final     Magnesium   Date Value Ref Range Status   05/18/2017 1.9 1.6 - 2.2 mg/dL Final     Phosphorus   Date Value Ref Range Status   05/18/2017 4.2 2.4 - 4.5 mg/dL Final           ID  Temp (24hrs), Avg:36.5 ??C (97.7 ??F), Min:36.3 ??C (97.3 ??F), Max:36.7 ??C (98.1 ??F)    No results in the last week        - no current s/s of infection  - culture if temp > 38.5  - d/c aline on POD #1      Current Access:         -  PIV x 2       - Arterial line: Arterial line to be removed today.       - Central venous line: No central line present.  Current antibiotics:  ??? acyclovir (ZOVIRAX) tablet 400 mg Oral Daily     Cultures:  No results found for: BLOOD CULTURE, URINE CULTURE, LOWER RESPIRATORY CULTURE  No results found for: WBC     Last CSF Analysis: No results found for: COLORCSF, APPEARCSF, NUCCELLSCSF, RBCCSF, NCSF, LYMPHSCSF, MONOMACCSF, EOSCSF, OTHERCSF, SPUNA, PROTCSF, GLUCCSF    Tubes and drains:  Patient Lines/Drains/Airways Status    Active Active Lines, Drains, & Airways     Name:   Placement date:   Placement time:   Site:   Days:    Urethral Catheter Non-latex;Temperature probe 16 Fr.   07/09/18    1055    Non-latex;Temperature probe   less than 1    Peripheral IV 12/28/17 Right Antecubital   12/28/17    0930    Antecubital   193    Peripheral IV 07/09/18 Right Wrist   07/09/18    1053    Wrist   less than 1    Arterial Line 07/09/18 Left Radial   07/09/18    1327    Radial   less than 1                         Objective Data        Temp:  [36.3 ??C (97.3 ??F)-36.7 ??C (98.1 ??F)] 36.3 ??C (97.3 ??F)  Heart  Rate:  [86-95] 95  SpO2 Pulse:  [93-94] 94  Resp:  [18-23] 23  BP: (174)/(89) 174/89  A BP-1: (114-122)/(54-64) 122/64  MAP:  [72 mmHg-84 mmHg] 84 mmHg  SpO2:  [95 %-98 %] 95 %    Intake/Output Summary (Last 24 hours) at 07/09/2018 1347  Last data filed at 07/09/2018 1320  Gross per 24 hour   Intake 1420 ml   Output 652 ml   Net 768 ml     SpO2: 95 %          There is no height or weight on file to calculate BMI.         Hemodynamic/Invasive Device Data (24 hrs):  A BP-1: (114-122)/(54-64) 122/64  MAP:  [72 mmHg-84 mmHg] 84 mmHg    Ventilation/Oxygen Therapy (24hrs):  O2 Flow Rate (L/min):  [6 L/min] 6 L/min        Medications:  Scheduled medications:   ??? acyclovir  400 mg Oral Daily   ??? atorvastatin  20 mg Oral Nightly   ??? carBAMazepine  200 mg Oral TID   ??? cholecalciferol (vitamin D3)  1,000 Units Oral Daily   ??? dimethyl fumarate  240 mg Oral 2xd Meals   ??? docusate sodium  100 mg Oral BID   ??? [START ON 07/10/2018] enoxaparin (LOVENOX) injection  40 mg Subcutaneous Q24H SCH   ??? escitalopram oxalate  10 mg Oral Daily   ??? fluticasone propion-salmeterol  1 puff Inhalation BID     Continuous infusions:     PRN medications:   acetaminophen, acetaminophen, albuterol, baclofen, MORPhine injection, ondansetron                   Physical Exam     All vital signs for the past 24 hours have been reviewed.  All laboratory studies resulted in the past 24 hours have been reviewed.     General Exam:  General: Awake and alert.   Appears comfortable. No obvious distress.   ENT:  Mucous membranes moist. Oropharynx clear.  Cardiovascular:  Regular rate and rhythm.  No murmurs.   2+ radial pulses bilaterally.    Respiratory: Breathing is comfortable and unlabored.  Lungs are clear to ausculation bilaterally.   Rhoncherous bilateral.  Gastrointestinal: Soft, nontender, nondistended.  Extremities: Warm and well-perfused. No cyanosis, clubbing, or edema.  Skin: No obvious rashes or ecchymoses.    Neurological Exam:  Mental Status  LOC: awake and alert  Orientation: person, place and time  Neglect: not present  Speech: normal  Language: fluent with intact naming, repetition, comprehension    Cranial Nerves  Pupils: PERRL  Corneals: present bilaterally  Gaze: normal  Face Sensory: intact and symmetric  Face Motor: normal and symmetric  Cough: strong  Tongue: midline    Motor:   RUE: commands and 5/5  LUE: commands and 5/5  RLE: commands and 5/5  LLE: commands and 5/5     Sensory:    Sensory is intact to light touch    Glasgow Coma Score  Motor: obeys commands = 6  Verbal: oriented = 5  Eyes: eyes open spontaneously = 4        DISPOSITION     The patient requires admission to the [facility] for management of the above conditions.    Estimated Transfer Date:  TBD        BILLING     This is an initial evaluation of the patient.        Herbert Seta  Buzzy Han  Neurocritical Care

## 2018-07-10 DIAGNOSIS — I671 Cerebral aneurysm, nonruptured: Principal | ICD-10-CM

## 2018-07-10 LAB — HEPARIN CORRELATION: Lab: <0.2

## 2018-07-10 LAB — BASIC METABOLIC PANEL
ANION GAP: 4 mmol/L — ABNORMAL LOW (ref 9–15)
BLOOD UREA NITROGEN: 12 mg/dL (ref 7–21)
BUN / CREAT RATIO: 18
CALCIUM: 8.6 mg/dL (ref 8.5–10.2)
CHLORIDE: 103 mmol/L (ref 98–107)
CREATININE: 0.66 mg/dL (ref 0.60–1.00)
EGFR CKD-EPI AA FEMALE: >90 mL/min/{1.73_m2} (ref >=60)
EGFR CKD-EPI NON-AA FEMALE: >90 mL/min/{1.73_m2} (ref >=60)
GLUCOSE RANDOM: 95 mg/dL (ref 65–179)
POTASSIUM: 4.5 mmol/L (ref 3.5–5.0)
SODIUM: 136 mmol/L (ref 135–145)

## 2018-07-10 LAB — CBC
HEMATOCRIT: 27.4 % — ABNORMAL LOW (ref 36.0–46.0)
HEMOGLOBIN: 9.5 g/dL — ABNORMAL LOW (ref 12.0–16.0)
MEAN CORPUSCULAR HEMOGLOBIN CONC: 34.7 g/dL (ref 31.0–37.0)
MEAN CORPUSCULAR HEMOGLOBIN: 32.4 pg (ref 26.0–34.0)
MEAN CORPUSCULAR VOLUME: 93.3 fL (ref 80.0–100.0)
MEAN PLATELET VOLUME: 8.8 fL (ref 7.0–10.0)
PLATELET COUNT: 198 10*9/L (ref 150–440)
RED BLOOD CELL COUNT: 2.94 10*12/L — ABNORMAL LOW (ref 4.00–5.20)
RED CELL DISTRIBUTION WIDTH: 13.2 % (ref 12.0–15.0)
WBC ADJUSTED: 7.6 10*9/L (ref 4.5–11.0)

## 2018-07-10 LAB — TROPONIN I: Troponin I.cardiac:MCnc:Pt:Ser/Plas:Qn:: <0.034

## 2018-07-10 LAB — HEMOGLOBIN
Lab: 8.5 — ABNORMAL LOW
Lab: 8.6 — ABNORMAL LOW
Lab: 9.7 — ABNORMAL LOW

## 2018-07-10 LAB — PHOSPHORUS: Phosphate:MCnc:Pt:Ser/Plas:Qn:: 4.6

## 2018-07-10 LAB — INR: Lab: 0.97

## 2018-07-10 LAB — BLOOD UREA NITROGEN: Urea nitrogen:MCnc:Pt:Ser/Plas:Qn:: 12

## 2018-07-10 LAB — MEAN PLATELET VOLUME: Lab: 8.8

## 2018-07-10 LAB — MAGNESIUM: Magnesium:MCnc:Pt:Ser/Plas:Qn:: 1.7

## 2018-07-10 LAB — PROTIME-INR: PROTIME: 11.1 s (ref 10.2–12.8)

## 2018-07-10 NOTE — Unmapped (Signed)
Care Management  Initial Transition Planning Assessment              General  Care Manager assessed the patient by : In person interview with patient, Medical record review, Discussion with Clinical Care team  Orientation Level: Oriented X4    Contact/Decision Maker      Extended Emergency Contact Information  Primary Emergency Contact: Micheline, Markes  Address: [address]           [place], [place] [address] [place]  Home Phone: [phone]; [phone] (cell)  Relation: Spouse    Legal Next of Kin / Guardian / POA / Advance Directives     Advance Directive (Medical Treatment)  Does patient have an advance directive covering medical treatment?: Patient does not have advance directive covering medical treatment.  Reason patient does not have an advance directive covering medical treatment:: Patient does not wish to complete one at this time  Reason there is not a Health Care Decision Maker appointed:: Patient does not wish to appoint a Health Care Decision Maker at this time  Information provided on advance directive:: No  Patient requests assistance:: No    Advance Directive (Mental Health Treatment)  Does patient have an advance directive covering mental health treatment?: Patient does not have advance directive covering mental health treatment.  Reason patient does not have an advance directive covering mental health treatment:: Patient does not wish to complete one at this time.    Patient Information  Lives with: Spouse/significant other    Type of Residence: Private residence  Type of Residence: Mailing Address:  [address]  [place] [address]  Contacts:    Patient Phone Number: [phone]        Medical Provider(s): Allegra Grana, NP  Reason for Admission: Admitting Diagnosis:  Cerebral aneurysm [I67.1]  Past Medical History:   has a past medical history of Aneurysm (CMS-HCC), Anxiety, COPD (chronic obstructive pulmonary disease) (CMS-HCC), Depression, Fibromyalgia, Fibromyalgia, primary (2011), Headache, tension-type (2011), MS (multiple sclerosis) (CMS-HCC), Restless leg syndrome, Sleep apnea, obstructive (2016), and Stroke (CMS-HCC) (2005).  Past Surgical History:   has a past surgical history that includes Cholecystectomy; Hysterectomy; colonoscopy; and IR Embolization Intracranial Spinal (05/17/2017).   Previous admit date: N/A    Editor, commissioning- Payor: Advertising copywriter MEDICARE ADV / Plan: Advertising copywriter MEDICARE ADV / Product Type: *No Product type* /   Secondary Insurance ??? Secondary Insurance  MEDICAID [place]  Prescription Coverage ???   Preferred Pharmacy - [id number] - [place], [place] - [address]    Transportation home: Private vehicle  Level of function prior to admission: Independent    Support Systems: Spouse    Responsibilities/Dependents at home?: No    Home Care services in place prior to admission?: No     Equipment Currently Used at Home: none(has a rollator and a cane)     Currently receiving outpatient dialysis?: No     Financial Information     Need for financial assistance?: No     Social Determinants of Health  Social Determinants of Health were addressed in provider documentation.  Please refer to patient history.    Discharge Needs Assessment  Concerns to be Addressed: adjustment to diagnosis/illness, care coordination/care conferences, discharge planning    Clinical Risk Factors: New Diagnosis, Multiple Diagnoses (Chronic), Functional Limitations    Barriers to taking medications: No    Prior overnight hospital stay or ED visit in last 90 days: No    Readmission  Within the Last 30 Days: no previous admission in last 30 days    Anticipated Changes Related to Illness: inability to care for self    Equipment Needed After Discharge: (TBD)    Discharge Facility/Level of Care Needs: (TBD)    Readmission  Risk of Unplanned Readmission Score: UNPLANNED READMISSION SCORE: 10%  Readmitted Within the Last 30 Days?   Patient at risk for readmission?: Yes    Discharge Plan  Screen findings are: Care Manager reviewed the plan of the patient's care with the Multidisciplinary Team. No discharge planning needs identified at this time. Care Manager will continue to manage plan and monitor patient's progress with the team.    Expected Discharge Date:  TBD    Expected Transfer from Critical Care: 07/12/18    Patient and/or family were provided with choice of facilities / services that are available and appropriate to meet post hospital care needs?: N/A     Initial Assessment complete?: Yes

## 2018-07-10 NOTE — Unmapped (Signed)
Neurocritical Care   Admission Note     CODE STATUS:  FULL    Date of service: 07/10/2018  Hospital Day:  LOS: 1 day         Assessment and Plan     Jennifer Lewis is a 57 y.o. female admitted to NSICU with a diagnosis of ICA aneurysm.     24 hour events: Drop on Hgb overnight and abdominal pain. CT A/P showed a large retroperitoneal hematoma.       Neuro:  Daily interruption of sedation: N/A  Multi-Modality Monitoring: none  Current anticonvulsants: Keppra.  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      **Pain/sedation  - tylenol prn mild pain  - oxycodone prn moderate pain  - fentanyl prn severe pain    Recent Labs   Lab Units 07/10/18  0313 07/09/18  1402   SODIUM mmol/L 136 140           Cardiovascular:  Admission weight: pending  Daily Weight: 72.1 kg (158 lb 15.2 oz)   A BP-1: (75-125)/(32-64) 75/45  MAP:  [56 mmHg-84 mmHg] 56 mmHg  A BP-2: (81-182)/(46-84) 141/75  MAP:  [58 mmHg-114 mmHg] 98 mmHg  Cardiac enzymes:   Recent Labs   Lab Units 07/10/18  0313   TROPONIN I ng/mL <0.034         **Hemodynamic goals  - Current vasoactive medications:  None  - MAP > 65  - SBP < 160 in the setting of hemorrhage  - EKG and troponin unremarkable this AM    **HLD  - continue statin    Heme  Current DVT prophylaxis: SCDs, Lovenox 40 mg daily    **Retroperitoneal hematoma  - 12.4 ->9 overnight  - was beyond time frame for need of reversal of heparin  - Q6 CBC  - recheck coags  - active type and screen  - hold chemical DVT prophylaxis for 48 hours due to acute hemorrhage or high bleeding risk   - discussed with vascular surgery  - groin duplex pending    Recent Labs   Lab Units 07/10/18  0313 07/09/18  1402   HEMOGLOBIN g/dL 9.5* 16.1   PLATELET COUNT (1) 10*9/L 198 185     Lab Results   Component Value Date    INR 1.14 05/17/2017    APTT 270.0 (HH) 05/17/2017           Pulmonary  The HOB is elevated to greater than 30 degrees.  O2/Vent Settings:   O2 Device: Nasal cannula  O2 Flow Rate (L/min):  [3 L/min-6 L/min] 3 L/min    ABG:  No results in the last day    - room air   - goal sat > 88%    **COPD  - no home oxtygen per patient  - duonebs PRN    GI  Weight: 72.1 kg (158 lb 15.2 oz)   Body mass index is 26.45 kg/m??.  GI prophylaxis: not indicated  The patient's last recorded bowel movement: PTA  Bowel regimen: start colace when taking PO  Current nutritional source: NPO       **Nutrition  - NPO for 24 hours in setting potential need for surgery      Most recent LFTs:  Lab Results   Component Value Date    AST 33 01/02/2018    ALT 29 01/02/2018    ALKPHOS 106 01/02/2018    BILITOT 0.7 01/02/2018    PROT 7.7 01/02/2018    ALBUMIN 4.5 01/02/2018         Renal/Endocrine    Intake/Output Summary (Last 24 hours) at 07/10/2018 0921  Last data filed at 07/10/2018 0600  Gross per 24 hour   Intake 3240 ml   Output 1527 ml   Net 1713 ml     The patient current insulin regimen for blood glucose control:  SSI  Foley catheter: Foley required for accurate continuous I/O's.       **Glycemic control  - continue FSBS, stop sliding scale for now  - check Hgb A1c    **Fluids/electrolytes  - goal euvolemia  - follow strict ins/outs  - obtain daily chemistry and supplement electrolytes as needed    **Hypokalemia  - replace potassium    **Hypocalcemia  - replace calcium    Recent Labs     07/09/18  1619 07/09/18  2025 07/10/18  0727   POCGLU 127 113 91     Recent Labs   Lab Units 07/10/18  0313 07/09/18  1402   CREATININE mg/dL 1.61 0.96*     No results found for: A1C  Potassium   Date Value Ref Range Status   07/10/2018 4.5 3.5 - 5.0 mmol/L Final     Magnesium   Date Value Ref Range Status   07/10/2018 1.7 1.6 - 2.2 mg/dL Final     Phosphorus   Date Value Ref Range Status   07/10/2018 4.6 2.9 - 4.7 mg/dL Final           ID  Temp (24hrs), Avg:36.8 ??C (98.2 ??F), Min:36.3 ??C (97.3 ??F), Max:37 ??C (98.6 ??F)    Recent Labs   Lab Units 07/10/18  0313 07/09/18  1402   WBC 10*9/L 7.6 6.7       - no current s/s of infection  - culture if temp > 38.5      Current Access:         -  PIV x 2       - Arterial line: No central line present.       - Central venous line: Arterial line required for BP monitoring.  Arterial line required for frequent blood draws.  Current antibiotics:  ??? acyclovir (ZOVIRAX) tablet 400 mg Oral Nightly     Cultures:  No results found for: BLOOD CULTURE, URINE CULTURE, LOWER RESPIRATORY CULTURE  WBC (10*9/L)   Date Value   07/10/2018 7.6        Last CSF Analysis: No results found for: COLORCSF, APPEARCSF, NUCCELLSCSF, RBCCSF, NCSF, LYMPHSCSF, MONOMACCSF, EOSCSF, OTHERCSF, SPUNA, PROTCSF, GLUCCSF    Tubes and drains:  Patient Lines/Drains/Airways Status    Active Active Lines, Drains, & Airways     Name:   Placement date:   Placement time:   Site:   Days:    Urethral Catheter Non-latex;Temperature probe 16 Fr.   07/09/18    1055    Non-latex;Temperature probe   less than 1    Peripheral  IV 12/28/17 Right Antecubital   12/28/17    0930    Antecubital   193    Peripheral IV 07/09/18 Right Wrist   07/09/18    1053    Wrist   less than 1    Peripheral IV 07/09/18 Left Hand   07/09/18    1320    Hand   less than 1    Peripheral IV 07/10/18 Anterior;Right Forearm   07/10/18    0534    Forearm   less than 1    Peripheral IV 07/10/18 Right Forearm   07/10/18    0525    Forearm   less than 1    Arterial Line 07/09/18 Left Radial   07/09/18    1327    Radial   less than 1                         Objective Data        Temp:  [36.3 ??C (97.3 ??F)-37 ??C (98.6 ??F)] 37 ??C (98.6 ??F)  Heart Rate:  [61-103] 103 SpO2 Pulse:  [61-102] 102  Resp:  [15-24] 20  BP: (81-160)/(37-127) 160/127  MAP (mmHg):  [46-134] 134  A BP-1: (75-125)/(32-64) 75/45  MAP:  [56 mmHg-84 mmHg] 56 mmHg  A BP-2: (81-182)/(46-84) 141/75  MAP:  [58 mmHg-114 mmHg] 98 mmHg  SpO2:  [91 %-98 %] 92 %  BMI (Calculated):  [26.45] 26.45    Intake/Output Summary (Last 24 hours) at 07/10/2018 0921  Last data filed at 07/10/2018 0600  Gross per 24 hour   Intake 3240 ml   Output 1527 ml   Net 1713 ml     SpO2: 92 %  Height: 165.1 cm (5' 5)   Weight: 72.1 kg (158 lb 15.2 oz)   Body mass index is 26.45 kg/m??.         Hemodynamic/Invasive Device Data (24 hrs):  A BP-1: (75-125)/(32-64) 75/45  MAP:  [56 mmHg-84 mmHg] 56 mmHg  A BP-2: (81-182)/(46-84) 141/75  MAP:  [58 mmHg-114 mmHg] 98 mmHg    Ventilation/Oxygen Therapy (24hrs):  O2 Device: Nasal cannula  O2 Flow Rate (L/min):  [3 L/min-6 L/min] 3 L/min        Medications:  Scheduled medications:   ??? acyclovir  400 mg Oral Nightly   ??? atorvastatin  20 mg Oral Nightly   ??? carBAMazepine  200 mg Oral TID   ??? cholecalciferol (vitamin D3)  1,000 Units Oral Daily   ??? dimethyl fumarate  240 mg Oral BID   ??? docusate sodium  100 mg Oral BID   ??? escitalopram oxalate  10 mg Oral Daily   ??? fluticasone propion-salmeterol  1 puff Inhalation BID   ??? insulin regular  0-12 Units Subcutaneous 4xd Meals & HS   ??? nicotine  1 patch Transdermal Daily     Continuous infusions:     PRN medications:   acetaminophen, acetaminophen, albuterol, baclofen, dextrose, hydrALAZINE, MORPhine injection, ondansetron, oxyCODONE                     Physical Exam     All vital signs for the past 24 hours have been reviewed.  All laboratory studies resulted in the past 24 hours have been reviewed.     General Exam:  General: Awake and alert.   Appears comfortable. No obvious distress.   ENT:  Mucous membranes moist. Oropharynx clear.  Cardiovascular:  Regular rate and rhythm.  Respiratory: Breathing is comfortable and unlabored.   Gastrointestinal: Soft, nontender, nondistended.  Extremities: Warm and well-perfused.  Skin: No obvious rashes or ecchymoses.    Neurological Exam:  Mental Status  LOC: awake and alert    Cranial Nerves  Pupils: PERRL  Corneals: present bilaterally  Gaze: normal  Face Motor: normal and symmetric  Cough: strong    Motor:   RUE: 5/5  LUE: 5/5  RLE: 5/5  LLE: 5/5     Sensory:    Sensory is intact to light touch    Glasgow Coma Score  Motor: obeys commands = 6  Verbal: oriented = 5  Eyes: eyes open spontaneously = 4        DISPOSITION     The patient requires admission to the [facility] for management of the above conditions.    Estimated Transfer Date:  TBD        BILLING     This patient is critically ill or injured with the impairment of vital organ systems such that there is a high probability of imminent or life threatening deterioration in the patient's condition. The reason the patient is critically ill and the nature of the treatment and management provided to manage the critically ill patient is as listed in above note.   I directly provided 35 minutes of critical care time as documented in this note. Time includes: direct patient care, patient reassessment, coordination of patient care, interpretation of data, review of patient medical records, and documentation of patient care. This time is exclusive of separately billable procedures.        Annye Asa, MD  Neurocritical Care

## 2018-07-10 NOTE — Unmapped (Signed)
Neurocritical Care   Supplementary Progress Note        ASSESSMENT / PLAN     Jennifer Lewis is a 57 y.o. female admitted to NSICU with a diagnosis of aneurysm.  Please see today's progress note for full details.  Ongoing management of the active conditions and plan is below.      **Right ICA aneurysm, PED #1  - SBP < 180  - sodium > 135, 136 this morning  - ASA 81 daily    **Possible retroperitoneal bleed  - complaints of new onset abdominal pain that has increased over the last couple of hours  - abdomen noted to be distended, round and painful to palpation in all quadrants  - right groin site soft with no hematoma noted, distal pulse intact  - Hgb down from 12.5 post op to 9.5, pt did not receive much fluid intra or post op so less likely to be dilutional  - KUB unremarkable  - currently hemodynamically stable but concern if slow bleed that vital signs Stjames not reflect accurate clinical picture  - obtain STAT CTA abdomen/pelvis          OBJECTIVE DATA       Vitals Reviewed:   Temp:  [36.3 ??C (97.3 ??F)-37 ??C (98.6 ??F)] 37 ??C (98.6 ??F)  Heart Rate:  [61-101] 101  SpO2 Pulse:  [61-99] 99  Resp:  [15-24] 18  BP: (81-174)/(37-89) 121/61  MAP (mmHg):  [46-81] 81  A BP-1: (75-125)/(32-64) 75/45  MAP:  [56 mmHg-84 mmHg] 56 mmHg  A BP-2: (81-148)/(46-68) 146/68  MAP:  [58 mmHg-95 mmHg] 95 mmHg  SpO2:  [93 %-98 %] 94 %  BMI (Calculated):  [26.45] 26.45  Temp (24hrs), Avg:36.8 ??C (98.2 ??F), Min:36.3 ??C (97.3 ??F), Max:37 ??C (98.6 ??F)    SpO2: 94 %  Height: 165.1 cm (5' 5)   Weight: 72.1 kg (158 lb 15.2 oz)   Body mass index is 26.45 kg/m??.    Body surface area is 1.82 meters squared.     Continuous Infusions:      Ventilator settings:  O2 Flow Rate (L/min):  [3 L/min-6 L/min] 3 L/min        NEURO EXAM      Mental status:   LOC: Awake and alert  Orientation: person, place, time  Speech: Normal  Language: normal    Cranial nerves:   Pupils: PERRL  Gaze: normal  Face sensory: normal  Face motor: normal  Vestibular: normal  Cough: normal  Tongue: midline,     Motor:   RUE: commands and 5/5  LUE: commands and 5/5  RLE: commands and 5/5  LLE: commands and 5/5     Sensory: intact       BILLING       This patient is critically ill or injured with the impairment of vital organ systems such that there is a high probability of imminent or life threatening deterioration in the patient's condition. The reasons the patient is critically ill and the nature of the treatment and management provided to manage the critically ill patient as above.  I directly provided 35 minutes of critical care time as documented in this note. Time includes: direct patient care, patient reassessment, coordination of patient care, interpretation of data, review of patient medical records, and documentation of patient care. This time is exclusive of separately billable procedures.      Charolotte Capuchin, ACNP  Neurocritical Care

## 2018-07-10 NOTE — Unmapped (Signed)
Pt is alert and oriented x4. She is full strength in all extremities. RLE embo site is soft and intact. SBP has remained <180, and she has remained in NSR. 3L [place] remains in place. Foley in place. No BM this shift. Pt complains of abdominal pain, provider notified, and KUB ordered. Alarms active and audible. See flowsheets for further details. Will continue to monitor.       Problem: Adult Inpatient Plan of Care  Goal: Plan of Care Review  Outcome: Progressing  Goal: Patient-Specific Goal (Individualization)  Outcome: Progressing  Goal: Absence of Hospital-Acquired Illness or Injury  Outcome: Progressing  Goal: Optimal Comfort and Wellbeing  Outcome: Progressing  Goal: Readiness for Transition of Care  Outcome: Progressing  Goal: Rounds/Family Conference  Outcome: Progressing     Problem: Skin Injury Risk Increased  Goal: Skin Health and Integrity  Outcome: Progressing     Problem: Pain Chronic (Persistent)  Goal: Acceptable Pain Control and Functional Ability  Outcome: Progressing     Problem: Fall Injury Risk  Goal: Absence of Fall and Fall-Related Injury  Outcome: Progressing     Problem: Chronic Pain (Multiple Sclerosis)  Goal: Acceptable Pain Control  Outcome: Progressing     Problem: Fatigue (Multiple Sclerosis)  Goal: Optimal Energy for Daily Activity  Outcome: Progressing     Problem: Mobility Impairment (Multiple Sclerosis)  Goal: Safety with Mobility  Outcome: Progressing     Problem: Self-Care Deficit (Multiple Sclerosis)  Goal: Ability to Complete Activities of Daily Living  Outcome: Progressing     Problem: COPD Comorbidity  Goal: Maintenance of COPD Symptom Control  Outcome: Progressing     Problem: Hypertension Comorbidity  Goal: Blood Pressure in Desired Range  Outcome: Progressing     Problem: Pain Chronic (Persistent) (Comorbidity Management)  Goal: Acceptable Pain Control and Functional Ability  Outcome: Progressing     Problem: Breathing Pattern Ineffective  Goal: Effective Breathing Pattern Outcome: Progressing

## 2018-07-10 NOTE — Unmapped (Signed)
Problem: Breathing Pattern Ineffective  Goal: Effective Breathing Pattern  Outcome: Progressing  Patient refused breathing treatments.

## 2018-07-10 NOTE — Unmapped (Signed)
[facility] Surgery  Initial Consult Note      Attending Physician:  Edison Simon*  Inpatient Service:  Neurosurgery [facility])  Date: 07/10/2018      Assessment :  Jennifer Lewis is a 57 y.o. female with history of left chronic ophthalmic aneurysm s/p pipeline embolization (04/2017) now s/p coil embolization of right cavernous ICA aneurysm. Concern for pelvic hemorrhage s/p procedure with no active extravasation.      Recommendations/Plan:  - Serial Hb  - Recommend resuscitation with blood  - Pseudoaneurysm groin duplex-- no concern for pseudoaneurysm  - CTA shows no concern for active extrav  - Recommend consulting IR if new concern for active bleeding    Thank you for this consult. Discussed with fellow Dr. Ginette Otto. Vascular surgery will continue to follow. Please page the [facility] consult pager at [phone] with any questions or concerns.    History of Present Illness:     Reason for Consult:  Concern for pelvic bleed s/p IR procedure    Jennifer Lewis is a 57 y.o. female with history of left chronic ophthalmic aneurysm s/p pipeline embolization (04/2017) now s/p coil embolization of right cavernous ICA aneurysm. Concern for pelvic hemorrhage s/p procedure with no active extravasation. Patient began to have new onset abdominal pain yesterday evening that began increasing in severity after her IR procedure on 07/09/18. Patient's abdomen became distended and painful to palpation diffusely. Her hemoglobin dropped from 12.4 to 9.5 s/p procedure. Patient continues to be hemodynamically stable. CTA was obtained STAT this AM showing retroperitoneal and peritoneal hemorrhage within right hemipelvis tracking along right paracolic gutter to anterior abdomen (10.7 x 5.8 cm). Patient denies chest pain, SOB, tingling, numbness.      Medications    No current facility-administered medications on file prior to encounter.      Current Outpatient Medications on File Prior to Encounter   Medication Sig Dispense Refill   ??? acyclovir (ZOVIRAX) 400 MG tablet Take by mouth daily.      ??? aspirin 81 MG chewable tablet Chew 1 tablet (81 mg total) daily. 30 tablet 0   ??? carBAMazepine (TEGRETOL) 200 mg tablet Take 200 mg by mouth Three (3) times a day.     ??? cholecalciferol, vitamin D3, (VITAMIN D3) 1,000 unit capsule Take 1,000 Units by mouth daily.     ??? dimethyl fumarate (TECFIDERA) 240 mg CpDR Take 1 capsule (240 mg total) by mouth Two (2) times a day with meals. 60 capsule 2   ??? escitalopram oxalate (LEXAPRO) 10 MG tablet Take 10 mg by mouth.     ??? fluticasone-salmeterol (ADVAIR) 100-50 mcg/dose diskus Inhale 1 puff Two (2) times a day.     ??? rosuvastatin (CRESTOR) 10 MG tablet Take 10 mg by mouth daily.     ??? baclofen (LIORESAL) 10 MG tablet Take 1 tablet (10 mg total) by mouth Three (3) times a day as needed for muscle spasms. 30 tablet 5   ??? ondansetron (ZOFRAN-ODT) 4 MG disintegrating tablet Take 4 mg by mouth every six (6) hours as needed for nausea.           Past Medical History  Past Medical History:   Diagnosis Date   ??? Aneurysm (CMS-HCC)    ??? Anxiety    ??? COPD (chronic obstructive pulmonary disease) (CMS-HCC)    ??? Depression    ??? Fibromyalgia    ??? Fibromyalgia, primary 2011   ??? Headache, tension-type 2011    Worse and more frequent in  the past 4-5 months.   ??? MS (multiple sclerosis) (CMS-HCC)    ??? Restless leg syndrome    ??? Sleep apnea, obstructive 2016    Sleep study done.  Used a CPAP.   ??? Stroke (CMS-HCC) 2005         Past Surgical History  Past Surgical History:   Procedure Laterality Date   ??? CHOLECYSTECTOMY     ??? colonoscopy     ??? HYSTERECTOMY     ??? IR EMBOLIZATION INTRACRANIAL SPINAL  05/17/2017    IR EMBOLIZATION INTRACRANIAL SPINAL 05/17/2017 Altamease Oiler, MD IM[facility]         Review of Systems  A 12 system review of systems was negative except as noted in HPI        Vital Signs    Patient Vitals for the past 8 hrs:   BP Temp Temp src Pulse SpO2 Pulse Resp SpO2   07/10/18 0615 160/127 ??? ??? 102 ??? 18 93 %   07/10/18 0600 123/57 ??? ??? 91 91 16 97 %   07/10/18 0545 136/68 ??? ??? 93 ??? 21 91 %   07/10/18 0530 ??? ??? ??? 80 ??? 21 93 %   07/10/18 0515 ??? ??? ??? 91 ??? 23 93 %   07/10/18 0500 ??? ??? ??? 91 91 20 94 %   07/10/18 0400 121/61 37 ??C Oral 101 99 18 94 %   07/10/18 0300 ??? ??? ??? 78 78 21 94 %   07/10/18 0200 ??? ??? ??? 87 87 24 94 %   07/10/18 0100 ??? ??? ??? 84 86 23 93 %   07/10/18 0000 121/63 36.9 ??C Oral 91 91 15 93 %   07/09/18 2300 ??? ??? ??? 97 97 23 93 %       I/O this shift:  In: 440 [P.O.:440]  Out: 740 [Urine:740]      Physical Exam  Neuro: No FND, follows commands  Pulmonary: Normal respiratory effort.   Cardiovascular: HDS, 2+ DP bilaterally  Abdo: firm, distended, diffusely tender to palpation, right groin site with dressing c/d/i, no signs of hematoma or ecchymosis  Extr: Warm/well perfused    Labs and Studies    Labs:  Recent Results (from the past 24 hour(s))   Type and Screen    Collection Time: 07/09/18  7:58 AM   Result Value Ref Range    ABO Grouping O NEG     Antibody Screen NEG    Basic Metabolic Panel    Collection Time: 07/09/18  2:02 PM   Result Value Ref Range    Sodium 140 135 - 145 mmol/L    Potassium 3.8 3.5 - 5.0 mmol/L    Chloride 111 (H) 98 - 107 mmol/L    CO2 28.0 22.0 - 30.0 mmol/L    Anion Gap 1 (L) 9 - 15 mmol/L    BUN 10 7 - 21 mg/dL    Creatinine 1.61 (L) 0.60 - 1.00 mg/dL    BUN/Creatinine Ratio 19     EGFR CKD-EPI Non-African American, Female >90 >=60 mL/min/1.77m2    EGFR CKD-EPI African American, Female >90 >=60 mL/min/1.31m2    Glucose 135 (H) 65 - 99 mg/dL    Calcium 8.2 (L) 8.5 - 10.2 mg/dL   CBC    Collection Time: 07/09/18  2:02 PM   Result Value Ref Range    WBC 6.7 4.5 - 11.0 10*9/L    RBC 3.95 (L) 4.00 - 5.20 10*12/L  HGB 12.4 12.0 - 16.0 g/dL    HCT 16.1 09.6 - 04.5 %    MCV 94.9 80.0 - 100.0 fL    MCH 31.5 26.0 - 34.0 pg    MCHC 33.2 31.0 - 37.0 g/dL    RDW 40.9 81.1 - 91.4 %    MPV 8.1 7.0 - 10.0 fL    Platelet 185 150 - 440 10*9/L   POCT Glucose    Collection Time: 07/09/18  4:19 PM   Result Value Ref Range Glucose, POC 127 65 - 179 mg/dL   POCT Glucose    Collection Time: 07/09/18  8:25 PM   Result Value Ref Range    Glucose, POC 113 65 - 179 mg/dL   Basic Metabolic Panel    Collection Time: 07/10/18  3:13 AM   Result Value Ref Range    Sodium 136 135 - 145 mmol/L    Potassium 4.5 3.5 - 5.0 mmol/L    Chloride 103 98 - 107 mmol/L    CO2 29.0 22.0 - 30.0 mmol/L    Anion Gap 4 (L) 9 - 15 mmol/L    BUN 12 7 - 21 mg/dL    Creatinine 7.82 9.56 - 1.00 mg/dL    BUN/Creatinine Ratio 18     EGFR CKD-EPI Non-African American, Female >90 >=60 mL/min/1.4m2    EGFR CKD-EPI African American, Female >90 >=60 mL/min/1.15m2    Glucose 95 65 - 179 mg/dL    Calcium 8.6 8.5 - 21.3 mg/dL   Magnesium Level    Collection Time: 07/10/18  3:13 AM   Result Value Ref Range    Magnesium 1.7 1.6 - 2.2 mg/dL   Phosphorus Level    Collection Time: 07/10/18  3:13 AM   Result Value Ref Range    Phosphorus 4.6 2.9 - 4.7 mg/dL   CBC    Collection Time: 07/10/18  3:13 AM   Result Value Ref Range    WBC 7.6 4.5 - 11.0 10*9/L    RBC 2.94 (L) 4.00 - 5.20 10*12/L    HGB 9.5 (L) 12.0 - 16.0 g/dL    HCT 08.6 (L) 57.8 - 46.0 %    MCV 93.3 80.0 - 100.0 fL    MCH 32.4 26.0 - 34.0 pg    MCHC 34.7 31.0 - 37.0 g/dL    RDW 46.9 62.9 - 52.8 %    MPV 8.8 7.0 - 10.0 fL    Platelet 198 150 - 440 10*9/L         Micro:  Microbiology Results (last day)     ** No results found for the last 24 hours. **            Imaging:     Cta Abdomen Pelvis W Wo Contrast    Result Date: 07/10/2018  EXAM: CTA abdomen and pelvis DATE: 07/10/2018 6:03 AM ACCESSION: [id number] UN DICTATED: 07/10/2018 6:22 AM INTERPRETATION LOCATION: [facility] CLINICAL INDICATION: 57 years old Female with eval retroperitoneal bleed  COMPARISON: None TECHNIQUE: A spiral CTA scan was obtained with IV contrast from the lung bases to the pubic symphysis.  Multiplanar reformatted and MIP images were provided for further evaluation of the vessels. For selected cases, 3D volume rendered images are also provided. VASCULAR FINDINGS: The abdominal aorta and branch vessels are patent and normal in appearance. No significant atherosclerotic disease. No active extravasation. There is mild stranding along the right femoral artery at the site of vascular access. NONVASCULAR FINDINGS: Bibasilar atelectasis. No focal hepatic lesions. The gallbladder  surgically absent. There is dilation of the common bile duct, likely secondary to cholecystectomy. The kidneys enhance symmetrically with no renal calculi or hydronephrosis. No evidence of bowel obstruction. No focally thickened or dilated loops of bowel are noted. Foley catheter is noted within the bladder with loculated air in the anti dependent portion of the bladder. Moderate volume hemorrhage noted within the right hemipelvis and tracking along the right paracolic gutter and anterior abdominal wall measuring approximately 10.7 x 5.8 cm (4:181), with no active extravasation noted.     --Retroperitoneal and peritoneal hemorrhage within the right hemipelvis, anterior to the bladder, and tracking along the right paracolic gutter, likely related to recent right inguinal vascular access. The findings of this study were discussed via telephone with DR. Wes Northam by Dr. Jackquline Denmark, MD on 07/10/2018 6:30 AM.      Karie Georges Abdomen 1 View    Result Date: 07/10/2018  EXAM: XR ABDOMEN 1 VIEW DATE: 07/10/2018 ACCESSION: [id number] UN DICTATED: 07/10/2018 4:39 AM INTERPRETATION LOCATION: [facility] CLINICAL INDICATION: 57 years old Female with ABDOMINAL PAIN  -  OTHER SPECIFIED SITE  COMPARISON: None. TECHNIQUE: Supine view of the abdomen. FINDINGS: Nonobstructed bowel gas pattern. Moderate colonic stool burden. Foley catheter with temperature probe seen overlying the bladder. Surgical clips in the right upper quadrant. No acute osseous abnormality is identified. Lung bases are clear.     Nonobstructed bowel gas pattern.

## 2018-07-10 NOTE — Unmapped (Signed)
Neuro: A&O x4.  Follows commands x4 + cough and corneals.    CV: SR on the monitor. SBP goal <180 met with no PRN's.  Pt was hypotensive and received a bolus x2.  Both were infused over 1 hour.  Resp: 4L [place].   Heme: SCDs in place bilaterally.  GI: No BM during my shift.  Regular diet.   GU/Renal:  Foley in place to gravity with adequate UOP.  Skin: Intact Q2 turns utilized.  Lines: L radial a-line with good waveform and blood return.  PIV x1 on L and PIV x1 on R  Endo: ACHS Accuchecks.  No coverage needed.  ID: Afebrile.  Pain: Pt c/o back pain and pain at groin site when coughing.  Morphine given x1.     Pts husband at the bedside and updated on POC. Will CTM closely and update ICU team as needed.      Problem: Adult Inpatient Plan of Care  Goal: Plan of Care Review  Outcome: Progressing  Goal: Patient-Specific Goal (Individualization)  Outcome: Progressing  Goal: Absence of Hospital-Acquired Illness or Injury  Outcome: Progressing  Intervention: Identify and Manage Fall Risk  Flowsheets (Taken 07/09/2018 1320)  Safety Interventions: aspiration precautions;bed alarm;bleeding precautions;environmental modification;fall reduction program maintained;infection management;lighting adjusted for tasks/safety;low bed;safety attendant  Intervention: Prevent Skin Injury  Flowsheets (Taken 07/09/2018 1600)  Pressure Reduction Techniques: pressure points protected  Intervention: Prevent VTE (venous thromboembolism)  Flowsheets (Taken 07/09/2018 1320)  VTE Prevention/Management: fluids promoted;other (see comments) (Bilateral SCD's)  Intervention: Prevent Infection  Flowsheets (Taken 07/09/2018 1320)  Infection Prevention: environmental surveillance performed;equipment surfaces disinfected;handwashing promoted;rest/sleep promoted;single patient room provided  Goal: Optimal Comfort and Wellbeing  Outcome: Progressing  Goal: Readiness for Transition of Care  Outcome: Progressing  Goal: Rounds/Family Conference  Outcome: Progressing     Problem: Skin Injury Risk Increased  Goal: Skin Health and Integrity  Outcome: Progressing  Intervention: Optimize Skin Protection  Flowsheets  Taken 07/09/2018 1600  Pressure Reduction Techniques: pressure points protected  Head of Bed (HOB): HOB at 20-30 degrees  Pressure Reduction Devices: pressure-redistributing mattress utilized  Taken 07/09/2018 1320  Skin Protection: incontinence pads utilized;skin to device areas padded;tubing/devices free from skin contact     Problem: Pain Chronic (Persistent)  Goal: Acceptable Pain Control and Functional Ability  Outcome: Progressing     Problem: Fall Injury Risk  Goal: Absence of Fall and Fall-Related Injury  Outcome: Progressing  Intervention: Promote Merchandiser, retail (Taken 07/09/2018 1320)  Safety Interventions: aspiration precautions;bed alarm;bleeding precautions;environmental modification;fall reduction program maintained;infection management;lighting adjusted for tasks/safety;low bed;safety attendant     Problem: Chronic Pain (Multiple Sclerosis)  Goal: Acceptable Pain Control  Outcome: Progressing     Problem: Fatigue (Multiple Sclerosis)  Goal: Optimal Energy for Daily Activity  Outcome: Progressing  Intervention: Promote Energy Conservation  Flowsheets (Taken 07/09/2018 1600)  Activity Management: activity adjusted per tolerance;other (see comments) (Mobility step 2)     Problem: Mobility Impairment (Multiple Sclerosis)  Goal: Safety with Mobility  Outcome: Progressing  Intervention: Optimize Mobility  Flowsheets (Taken 07/09/2018 1600)  Activity Management: activity adjusted per tolerance;other (see comments) (Mobility step 2)  Positioning/Transfer Devices: pillows;in use     Problem: Self-Care Deficit (Multiple Sclerosis)  Goal: Ability to Complete Activities of Daily Living  Outcome: Progressing

## 2018-07-10 NOTE — Unmapped (Signed)
Neurocritical Care   Progress Note     CODE STATUS:  FULL    Date of service: 07/10/2018  Hospital Day:  LOS: 1 day             Assessment and Plan     Jennifer Lewis is a 57 y.o. 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 The patient is admitted to NSICU following coil embolization of right ICA aneurysm.    24 Hour Events: The patient was noted to have abdominal pain overnight and found to have a right sided retroperitoneal hematoma.     Neuro:  Daily interruption of sedation: N/A  Multi-Modality Monitoring: none  Current anticonvulsants: not indicated.  Mobilty protocol current stage: 3  Splints/PRAFO:  No  Restraints:  Not indicated  CAM-ICU:  Negative    **Right ICA aneurysm s/p coil embolization PED#1  - clinically with intact neurologic exam  - holding home ASA in the setting of retroperitoneal hematoma  - SBP goal <160  - Sodium goal >135, checking daily  - PT/OT consults when stable, defer today  - patient remains at risk for neurological decline due to post-op hemorrhage, cerebral edema, and seizures and requires close monitoring in the NSICU     **Depression  - Continue home Lexapro    **MS  - Continue home Tecfidera and PRN Baclofen  - Continue Tegretol for paryoxysmal sensory MS symptoms     **Pain/sedation  - tylenol prn mild pain  - oxycodone prn moderate pain  - morphine prn severe pain       Recent Labs   Lab Units 07/10/18  0313 07/09/18  1402   SODIUM mmol/L 136 140         Cardiovascular:  Admission weight: 72  Daily Weight: 72.1 kg (158 lb 15.2 oz)   A BP-1: (75-125)/(32-64) 75/45  MAP:  [56 mmHg-84 mmHg] 56 mmHg  A BP-2: (81-182)/(46-84) 141/75  MAP:  [58 mmHg-114 mmHg] 98 mmHg  Cardiac enzymes: No results in the last day      **Hemodynamic goals  - Current vasoactive medications:  None  - MAP > 65  - goal SBP < 160 to reduce risk of post-op hemorrhage  - labetolol/hydralazine prn  - troponins unremarkable, EKG normal sinus rhythm    **Hyperlipidemia  - Continue atorvastatin 20 mg daily (home med)      Heme  Current DVT prophylaxis: SCDs, holding lovenox in the setting of active bleeding    **Right Retroperitoneal Hematoma  - in the setting of recent groin puncture for cerebral angiogram  - PVL groin pseudoaneurysm protocol pending  - CTA abd/pelvis confirmed retroperitoneal and peritoneal hemorrhage of the right hemipelvis  - obtain coags  - continue to trend hemoglobin Q6     Recent Labs   Lab Units 07/10/18  0313 07/09/18  1402   HEMOGLOBIN g/dL 9.5* 16.1   PLATELET COUNT (1) 10*9/L 198 185     Lab Results   Component Value Date    INR 1.14 05/17/2017    APTT 270.0 (HH) 05/17/2017         Pulmonary  The HOB is elevated to greater than 30 degrees.  O2/Vent Settings:   O2 Flow Rate (L/min):  [3 L/min-6 L/min] 4 L/min      **COPD   - O2 [place] 3L, wean as able  - IS q2 while awake  -  Titrate O2 for sat > 88%  - Scheduled Advair, prn Albuterol      **Tobacco use  - smoking cessation counseling  - nicotine      GI  Weight: 72.1 kg (158 lb 15.2 oz)   Body mass index is 26.45 kg/m??.    GI prophylaxis: not indicated  The patient's last recorded bowel movement: PTA  Bowel regimen: colace  Current nutritional source: NPO      **Nutrition  - NPO for 24 hours post abdominal hemorrhage  - bedside dysphagia screen  - advance diet as tolerated     **Nausea  - zofran prn    Most recent LFTs:  Lab Results   Component Value Date    AST 33 01/02/2018    ALT 29 01/02/2018    ALKPHOS 106 01/02/2018    BILITOT 0.7 01/02/2018    PROT 7.7 01/02/2018    ALBUMIN 4.5 01/02/2018           Renal/Endocrine    Intake/Output Summary (Last 24 hours) at 07/10/2018 0725  Last data filed at 07/10/2018 0600  Gross per 24 hour   Intake 3240 ml   Output 1527 ml   Net 1713 ml     The patient current insulin regimen for blood glucose control:  SSI  Foley catheter: Foley to be removed today.      - obtain daily chemistry and supplement electrolytes as needed     **Glycemic control  - discontinue SSI, no need      **Fluids  - goal euvolemia, NS at 75 cc/hour while NPO  - foley required for strict ins/outs in the setting of active hemorrhage and hypovolemia      Recent Labs   Lab Units 07/10/18  0313 07/09/18  1402   CREATININE mg/dL 4.54 0.98*     No results found for: A1C  Potassium   Date Value Ref Range Status   07/10/2018 4.5 3.5 - 5.0 mmol/L Final     Magnesium   Date Value Ref Range Status   07/10/2018 1.7 1.6 - 2.2 mg/dL Final     Phosphorus   Date Value Ref Range Status   07/10/2018 4.6 2.9 - 4.7 mg/dL Final           ID  Temp (24hrs), Avg:36.8 ??C (98.2 ??F), Min:36.3 ??C (97.3 ??F), Max:37 ??C (98.6 ??F)    Recent Labs   Lab Units 07/10/18  0313 07/09/18  1402   WBC 10*9/L 7.6 6.7       - no current s/s of infection  - culture if temp > 38.5      Current Access:         -  PIV x 2       - Arterial line: Arterial line to be removed today.       - Central venous line: No central line present.  Current antibiotics:  ??? acyclovir (ZOVIRAX) tablet 400 mg Oral Nightly     Cultures:  No results found for: BLOOD CULTURE, URINE CULTURE, LOWER RESPIRATORY CULTURE  WBC (10*9/L)   Date Value   07/10/2018 7.6        Last CSF Analysis: No results found for: COLORCSF, APPEARCSF, NUCCELLSCSF, RBCCSF, NCSF, LYMPHSCSF, MONOMACCSF, EOSCSF, OTHERCSF, SPUNA, PROTCSF, GLUCCSF    Tubes and drains:  Patient Lines/Drains/Airways Status    Active Active Lines, Drains, & Airways     Name:   Placement date:   Placement time:   Site:   Days:  Urethral Catheter Non-latex;Temperature probe 16 Fr.   07/09/18    1055    Non-latex;Temperature probe   less than 1    Peripheral IV 12/28/17 Right Antecubital   12/28/17    0930    Antecubital   193    Peripheral IV 07/09/18 Right Wrist   07/09/18    1053    Wrist   less than 1    Peripheral IV 07/09/18 Left Hand   07/09/18    1320    Hand   less than 1    Peripheral IV 07/10/18 Anterior;Right Forearm   07/10/18    0534    Forearm   less than 1    Peripheral IV 07/10/18 Right Forearm   07/10/18    0525    Forearm   less than 1    Arterial Line 07/09/18 Left Radial   07/09/18    1327    Radial   less than 1                         Objective Data        Temp:  [36.3 ??C (97.3 ??F)-37 ??C (98.6 ??F)] 37 ??C (98.6 ??F)  Heart Rate:  [61-102] 102  SpO2 Pulse:  [61-102] 102  Resp:  [15-24] 24  BP: (81-174)/(37-127) 160/127  MAP (mmHg):  [46-134] 134  A BP-1: (75-125)/(32-64) 75/45  MAP:  [56 mmHg-84 mmHg] 56 mmHg  A BP-2: (81-182)/(46-84) 141/75  MAP:  [58 mmHg-114 mmHg] 98 mmHg  SpO2:  [91 %-98 %] 93 %  BMI (Calculated):  [26.45] 26.45    Intake/Output Summary (Last 24 hours) at 07/10/2018 0725  Last data filed at 07/10/2018 0600  Gross per 24 hour   Intake 3240 ml   Output 1527 ml   Net 1713 ml     SpO2: 93 %  Height: 165.1 cm (5' 5)   Weight: 72.1 kg (158 lb 15.2 oz)   Body mass index is 26.45 kg/m??.         Hemodynamic/Invasive Device Data (24 hrs):  A BP-1: (75-125)/(32-64) 75/45  MAP:  [56 mmHg-84 mmHg] 56 mmHg  A BP-2: (81-182)/(46-84) 141/75  MAP:  [58 mmHg-114 mmHg] 98 mmHg    Ventilation/Oxygen Therapy (24hrs):  O2 Flow Rate (L/min):  [3 L/min-6 L/min] 4 L/min        Medications:  Scheduled medications:   ??? acyclovir  400 mg Oral Nightly   ??? atorvastatin  20 mg Oral Nightly   ??? carBAMazepine  200 mg Oral TID   ??? cholecalciferol (vitamin D3)  1,000 Units Oral Daily   ??? dimethyl fumarate  240 mg Oral BID   ??? docusate sodium  100 mg Oral BID   ??? escitalopram oxalate  10 mg Oral Daily   ??? fluticasone propion-salmeterol  1 puff Inhalation BID   ??? hydrALAZINE       ??? insulin regular  0-12 Units Subcutaneous 4xd Meals & HS   ??? nicotine  1 patch Transdermal Daily     Continuous infusions:     PRN medications:   acetaminophen, acetaminophen, albuterol, baclofen, dextrose, hydrALAZINE, MORPhine injection, ondansetron, oxyCODONE                   Physical Exam     All vital signs for the past 24 hours have been reviewed.  All laboratory studies resulted in the past 24 hours have been reviewed.     General Exam:  General: Awake and alert.   Appears comfortable. No obvious distress.   ENT:  Mucous membranes moist. Oropharynx clear.  Cardiovascular:  Regular rate and rhythm.  No murmurs.   2+ radial pulses bilaterally.    Respiratory: Breathing is comfortable and unlabored.  Lungs are clear to ausculation bilaterally.   Rhoncherous bilateral.  Gastrointestinal: soft, gaurding right and left lower quadrant  Extremities: Warm and well-perfused. No cyanosis, clubbing, or edema.  Skin: No obvious rashes or ecchymoses.    Neurological Exam:  Mental Status  LOC: awake and alert  Orientation: person, place and time  Neglect: not present  Speech: normal  Language: fluent with intact naming, repetition, comprehension    Cranial Nerves  Pupils: PERRL  Corneals: present bilaterally  Gaze: normal  Face Sensory: intact and symmetric  Face Motor: normal and symmetric  Cough: strong  Tongue: midline    Motor:   RUE: commands and 5/5  LUE: commands and 5/5  RLE: commands and 5/5  LLE: commands and 5/5     Sensory:    Sensory is intact to light touch    Glasgow Coma Score  Motor: obeys commands = 6  Verbal: oriented = 5  Eyes: eyes open spontaneously = 4        DISPOSITION     The patient requires admission to the [facility] for management of the above conditions.    Estimated Transfer Date:  07/12/18        BILLING     Hillery Hunter, MD  Neurocritical Care Fellow  07/10/18 7:55 AM

## 2018-07-10 NOTE — Unmapped (Signed)
Neurocritical Care   Admission Note     CODE STATUS:  FULL    Date of service: 07/09/2018  Hospital Day:  LOS: 0 days        HPI     Jennifer Lewis is a 57 y.o. female with PMH MS, COPD, anxiety, fibromyalgia, OSA and prior pipeline embolization in [place] 2018, now s/p R cavernous internal carotid artery aneurysm coil embolization.         History     PAST MEDICAL HISTORY  Past Medical History:   Diagnosis Date   ??? Aneurysm (CMS-HCC)    ??? Anxiety    ??? COPD (chronic obstructive pulmonary disease) (CMS-HCC)    ??? Depression    ??? Fibromyalgia    ??? Fibromyalgia, primary 2011   ??? Headache, tension-type 2011    Worse and more frequent in the past 4-5 months.   ??? MS (multiple sclerosis) (CMS-HCC)    ??? Restless leg syndrome    ??? Sleep apnea, obstructive 2016    Sleep study done.  Used a CPAP.   ??? Stroke (CMS-HCC) 2005       SURGICAL HISTORY  Past Surgical History:   Procedure Laterality Date   ??? CHOLECYSTECTOMY     ??? colonoscopy     ??? HYSTERECTOMY     ??? IR EMBOLIZATION INTRACRANIAL SPINAL  05/17/2017    IR EMBOLIZATION INTRACRANIAL SPINAL 05/17/2017 Altamease Oiler, MD IMG VIR [facility]       HOME MEDICATIONS  No current facility-administered medications on file prior to encounter.      Current Outpatient Medications on File Prior to Encounter   Medication Sig   ??? acyclovir (ZOVIRAX) 400 MG tablet Take by mouth daily.    ??? aspirin 81 MG chewable tablet Chew 1 tablet (81 mg total) daily.   ??? carBAMazepine (TEGRETOL) 200 mg tablet Take 200 mg by mouth Three (3) times a day.   ??? cholecalciferol, vitamin D3, (VITAMIN D3) 1,000 unit capsule Take 1,000 Units by mouth daily.   ??? dimethyl fumarate (TECFIDERA) 240 mg CpDR Take 1 capsule (240 mg total) by mouth Two (2) times a day with meals.   ??? escitalopram oxalate (LEXAPRO) 10 MG tablet Take 10 mg by mouth.   ??? fluticasone-salmeterol (ADVAIR) 100-50 mcg/dose diskus Inhale 1 puff Two (2) times a day.   ??? rosuvastatin (CRESTOR) 10 MG tablet Take 10 mg by mouth daily.   ??? baclofen (LIORESAL) 10 MG tablet Take 1 tablet (10 mg total) by mouth Three (3) times a day as needed for muscle spasms.   ??? ondansetron (ZOFRAN-ODT) 4 MG disintegrating tablet Take 4 mg by mouth every six (6) hours as needed for nausea.       Allergies  Diazepam    Family History  Family History   Problem Relation Age of Onset   ??? Cancer Father    ??? Hypertension Brother    ??? Heart disease Brother    ??? Cancer Maternal Grandfather    ??? Cancer Paternal Grandfather    ??? Cancer Paternal Uncle    ??? COPD Brother    ??? Heart attack Brother    ??? Stroke Mother    ??? Stroke Maternal Grandmother    ??? Thyroid disease Maternal Grandmother    ??? Multiple sclerosis Neg Hx    ??? Aneurysm Neg Hx        Social History  Social History     Tobacco Use   ??? Smoking status: Current Every  Day Smoker     Packs/day: 1.00     Years: 36.00     Pack years: 36.00     Types: Cigarettes   ??? Smokeless tobacco: Never Used   Substance Use Topics   ??? Alcohol use: No     Alcohol/week: 0.0 oz   ??? Drug use: No         Review of Systems     A comprehensive review of systems was negative.       Assessment and Plan     Jennifer Lewis is a 58 y.o. female admitted to NSICU with a diagnosis of ICA aneurysm.       Neuro:  Daily interruption of sedation: N/A  Multi-Modality Monitoring: none  Current anticonvulsants: Keppra.  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        **Pain/sedation  - tylenol prn mild pain  - oxycodone prn moderate pain  - fentanyl prn severe pain    Recent Labs   Lab Units 07/09/18  1402   SODIUM mmol/L 140           Cardiovascular:  Admission weight: pending  Daily Weight: 72.1 kg (158 lb 15.2 oz)   A BP-1: (75-125)/(32-64) 75/45  MAP:  [56 mmHg-84 mmHg] 56 mmHg  A BP-2: (81-135)/(46-68) 135/68  MAP:  [58 mmHg-89 mmHg] 89 mmHg  Cardiac enzymes: No results in the last day      **Hemodynamic goals  - Current vasoactive medications:  None  - MAP > 65  - SBP < 180    **HLD  - continue statin    Heme  Current DVT prophylaxis: SCDs, Lovenox 40 mg daily    - check daily CBC  - active type and screen  - start chemical DVT prophylaxis given high risk    Recent Labs   Lab Units 07/09/18  1402   HEMOGLOBIN g/dL 16.1   PLATELET COUNT (1) 10*9/L 185     Lab Results   Component Value Date    INR 1.14 05/17/2017    APTT 270.0 (HH) 05/17/2017           Pulmonary  The HOB is elevated to greater than 30 degrees.  O2/Vent Settings:   O2 Flow Rate (L/min):  [4 L/min-6 L/min] 4 L/min    ABG:  No results in the last day    - room air   - goal sat > 93%      GI  Weight: 72.1 kg (158 lb 15.2 oz)   Body mass index is 26.45 kg/m??.    GI prophylaxis: not indicated  The patient's last recorded bowel movement: PTA  Bowel regimen: start colace when taking PO  Current nutritional source: NPO       **Nutrition  - NPO      Most recent LFTs:  Lab Results   Component Value Date    AST 33 01/02/2018    ALT 29 01/02/2018  ALKPHOS 106 01/02/2018    BILITOT 0.7 01/02/2018    PROT 7.7 01/02/2018    ALBUMIN 4.5 01/02/2018         Renal/Endocrine    Intake/Output Summary (Last 24 hours) at 07/09/2018 2241  Last data filed at 07/09/2018 2200  Gross per 24 hour   Intake 3020 ml   Output 1192 ml   Net 1828 ml     The patient current insulin regimen for blood glucose control:  SSI  Foley catheter: Foley required for accurate continuous I/O's.       **Glycemic control  - start FSBS and SSI to prevent hyperglycemia and to assess insulin needs  - check Hgb A1c    **Fluids/electrolytes  - goal euvolemia  - follow strict ins/outs  - obtain daily chemistry and supplement electrolytes as needed    **Hypokalemia  - replace potassium    **Hypocalcemia  - replace calcium    Recent Labs     07/09/18  1619 07/09/18  2025   POCGLU 127 113     Recent Labs   Lab Units 07/09/18  1402   CREATININE mg/dL 1.61*     No results found for: A1C  Potassium   Date Value Ref Range Status   07/09/2018 3.8 3.5 - 5.0 mmol/L Final     Magnesium   Date Value Ref Range Status   05/18/2017 1.9 1.6 - 2.2 mg/dL Final     Phosphorus   Date Value Ref Range Status   05/18/2017 4.2 2.4 - 4.5 mg/dL Final           ID  Temp (24hrs), Avg:36.7 ??C (98 ??F), Min:36.3 ??C (97.3 ??F), Max:36.9 ??C (98.4 ??F)    Recent Labs   Lab Units 07/09/18  1402   WBC 10*9/L 6.7       - no current s/s of infection  - culture if temp > 38.5      Current Access:         -  PIV x 2       - Arterial line: No central line present.       - Central venous line: Arterial line required for BP monitoring.  Current antibiotics:  ??? acyclovir (ZOVIRAX) tablet 400 mg Oral Nightly     Cultures:  No results found for: BLOOD CULTURE, URINE CULTURE, LOWER RESPIRATORY CULTURE  WBC (10*9/L)   Date Value   07/09/2018 6.7        Last CSF Analysis: No results found for: COLORCSF, APPEARCSF, NUCCELLSCSF, RBCCSF, NCSF, LYMPHSCSF, MONOMACCSF, EOSCSF, OTHERCSF, SPUNA, PROTCSF, GLUCCSF    Tubes and drains:  Patient Lines/Drains/Airways Status    Active Active Lines, Drains, & Airways     Name:   Placement date:   Placement time:   Site:   Days:    Urethral Catheter Non-latex;Temperature probe 16 Fr.   07/09/18    1055    Non-latex;Temperature probe   less than 1    Peripheral IV 12/28/17 Right Antecubital   12/28/17    0930    Antecubital   193    Peripheral IV 07/09/18 Right Wrist   07/09/18    1053    Wrist   less than 1    Peripheral IV 07/09/18 Left Hand   07/09/18    1320    Hand   less than 1    Arterial Line 07/09/18 Left Radial   07/09/18    1327    Radial   less than 1  Objective Data        Temp:  [36.3 ??C (97.3 ??F)-36.9 ??C (98.4 ??F)] 36.8 ??C (98.3 ??F)  Heart Rate:  [61-95] 93  SpO2 Pulse:  [61-95] 95  Resp:  [17-23] 20  BP: (81-174)/(37-89) 104/59  MAP (mmHg):  [46-73] 73  A BP-1: (75-125)/(32-64) 75/45  MAP:  [56 mmHg-84 mmHg] 56 mmHg  A BP-2: (81-135)/(46-68) 135/68  MAP:  [58 mmHg-89 mmHg] 89 mmHg  SpO2:  [93 %-98 %] 93 %  BMI (Calculated):  [26.45] 26.45    Intake/Output Summary (Last 24 hours) at 07/09/2018 2241  Last data filed at 07/09/2018 2200  Gross per 24 hour   Intake 3020 ml   Output 1192 ml   Net 1828 ml     SpO2: 93 %  Height: 165.1 cm (5' 5)   Weight: 72.1 kg (158 lb 15.2 oz)   Body mass index is 26.45 kg/m??.         Hemodynamic/Invasive Device Data (24 hrs):  A BP-1: (75-125)/(32-64) 75/45  MAP:  [56 mmHg-84 mmHg] 56 mmHg  A BP-2: (81-135)/(46-68) 135/68  MAP:  [58 mmHg-89 mmHg] 89 mmHg    Ventilation/Oxygen Therapy (24hrs):  O2 Flow Rate (L/min):  [4 L/min-6 L/min] 4 L/min        Medications:  Scheduled medications:   ??? acyclovir  400 mg Oral Nightly   ??? aspirin  81 mg Oral Daily   ??? atorvastatin  20 mg Oral Nightly   ??? carBAMazepine  200 mg Oral TID   ??? cholecalciferol (vitamin D3)  1,000 Units Oral Daily   ??? dimethyl fumarate  240 mg Oral BID   ??? docusate sodium  100 mg Oral BID   ??? escitalopram oxalate  10 mg Oral Daily   ??? fluticasone propion-salmeterol  1 puff Inhalation BID   ??? insulin regular  0-12 Units Subcutaneous 4xd Meals & HS   ??? nicotine  1 patch Transdermal Daily     Continuous infusions:     PRN medications:   acetaminophen, acetaminophen, albuterol, baclofen, dextrose, MORPhine injection, ondansetron, oxyCODONE                     Physical Exam     All vital signs for the past 24 hours have been reviewed.  All laboratory studies resulted in the past 24 hours have been reviewed.     General Exam:  General: Awake and alert.   Appears comfortable. No obvious distress.   ENT:  Mucous membranes moist. Oropharynx clear.  Cardiovascular:  Regular rate and rhythm.   Respiratory: Breathing is comfortable and unlabored.   Gastrointestinal: Soft, nontender, nondistended.  Extremities: Warm and well-perfused.  Skin: No obvious rashes or ecchymoses.    Neurological Exam:  Mental Status  LOC: awake and alert    Cranial Nerves  Pupils: PERRL  Corneals: present bilaterally  Gaze: normal  Face Motor: normal and symmetric  Cough: strong    Motor:   RUE: 5/5  LUE: 5/5  RLE: 5/5  LLE: 5/5     Sensory:    Sensory is intact to light touch    Glasgow Coma Score  Motor: obeys commands = 6  Verbal: oriented = 5  Eyes: eyes open spontaneously = 4        DISPOSITION     The patient requires admission to the [facility] for management of the above conditions.    Estimated Transfer Date:  TBD  BILLING     This patient is critically ill or injured with the impairment of vital organ systems such that there is a high probability of imminent or life threatening deterioration in the patient's condition. The reason the patient is critically ill and the nature of the treatment and management provided to manage the critically ill patient is as listed in above note.   I directly provided 34 minutes of critical care time as documented in this note. Time includes: direct patient care, patient reassessment, coordination of patient care, interpretation of data, review of patient medical records, and documentation of patient care. This time is exclusive of separately billable procedures.        Annye Asa, MD  Neurocritical Care

## 2018-07-10 NOTE — Unmapped (Signed)
Inhaled medication given without complications.    Problem: COPD Comorbidity  Goal: Maintenance of COPD Symptom Control  Outcome: Progressing     Problem: Breathing Pattern Ineffective  Goal: Effective Breathing Pattern  Outcome: Progressing

## 2018-07-10 NOTE — Unmapped (Signed)
Neurocritical Care   Supplementary Progress Note        ASSESSMENT / PLAN     Jennifer Lewis is a 57 y.o. female admitted to NSICU with a diagnosis of ICA aneurysm.  Please see today's progress note for full details.  Ongoing management of the active conditions and plan is below.     **R ICA aneurysm, PED #0  - goal SBP lowered to <160 and off of aspirin due to concern for retroperitoneal bleed   - bedrest today to prevent further injury    **Hemodynamics  - goal SBP <160 due to hemorrhage  - maintained with prn Hydralazine     **Retroperitoneal hematoma   - consulted vascular surgery  - groin duplex negative for pseudoaneurysm or fistula  - hemoglobin dropped to 8.5 (from 12.4 yesterday), check q6   - s/p PRBC 1 unit and FFP 1 unit due to significant drop  - holding DVT prophylaxis                   OBJECTIVE DATA       Vitals Reviewed:   Temp:  [36.8 ??C (98.3 ??F)-37.2 ??C (99 ??F)] 37.2 ??C (99 ??F)  Heart Rate:  [72-106] 92  SpO2 Pulse:  [72-106] 92  Resp:  [15-28] 18  BP: (94-160)/(57-127) 160/127  MAP (mmHg):  [64-134] 134  A BP-2: (104-182)/(50-84) 129/66  MAP:  [68 mmHg-114 mmHg] 89 mmHg  SpO2:  [88 %-98 %] 93 %  Temp (24hrs), Avg:37 ??C (98.6 ??F), Min:36.8 ??C (98.3 ??F), Max:37.2 ??C (99 ??F)    SpO2: 93 %  Height: 165.1 cm (5' 5)   Weight: 72.1 kg (158 lb 15.2 oz)   Body mass index is 26.45 kg/m??.    Body surface area is 1.82 meters squared.     Continuous Infusions:  ??? sodium chloride 75 mL/hr (07/10/18 1020)   ??? sodium chloride 75 mL/hr (07/10/18 1357)   ??? sodium chloride 75 mL/hr (07/10/18 1609)       Ventilator settings:  O2 Device: Nasal cannula  O2 Flow Rate (L/min):  [2 L/min-4 L/min] 2 L/min        NEURO EXAM      Mental status:   LOC: Awake and alert  Orientation: person, place, time  Speech: Normal  Language: normal    Cranial nerves:   Pupils: PERRL  Corneal reflex: normal  Gaze: normal  Face sensory: normal  Face motor: normal  Vestibular: normal  Cough: normal  Gag: normal  Tongue: midline Motor:   RUE: commands and 5/5  LUE: commands and 5/5  RLE: commands and 5/5  LLE: commands and 5/5        BILLING       This patient is critically ill or injured with the impairment of vital organ systems such that there is a high probability of imminent or life threatening deterioration in the patient's condition. The reasons the patient is critically ill and the nature of the treatment and management provided to manage the critically ill patient as above.  I directly provided 36 minutes of critical care time as documented in this note. Time includes: direct patient care, patient reassessment, coordination of patient care, interpretation of data, review of patient medical records, and documentation of patient care. This time is exclusive of separately billable procedures.      Verne Grain, ACNP  Neurocritical Care

## 2018-07-10 NOTE — Unmapped (Signed)
NEUROSURGERY PROGRESS NOTE    Brief History of Present Illness  Jennifer Lewis is a 57 y.o. female with history of left ophthalmic aneurysm s/p pipeline embolization (04/25/2017) now s/p coil embolization of right cavernous ICA aneurysm.    Subjective/Interval History  Significant tenderness to palpation in abdomen, will order KUB to start work up    Interval Imaging Reviewed  None    Neurological Assessment and Plan  **left ophthalmic aneurysm and right cavernous ICA aneurysm  - PED 1 s/p pipeline embolization  - continue home dose aspirin tomorrow  - Na>135  - SBP<180  - KUB ordered for abdominal tenderness  - will clarify follow up with [facility]    Disposition: [facility], possible discharge home today    This patient is co-managed with the [facility]. The management of this patient was discussed in detail with them and we agree with their overall assessment and plan.      ___________________________________________________________________    Neurological Exam  AOx3  PERRL  EOMI  F=  TML  SILT  5/5x4  No PND  Groin site flat  DP 2+      The patient's vitals, intake/output, labs, orders, and relevant imaging were reviewed for the last 24 hours.    Problem List  Active Problems:    * No active hospital problems. *  Resolved Problems:    * No resolved hospital problems. *

## 2018-07-11 ENCOUNTER — Telehealth: Payer: Self-pay | Admitting: Cardiovascular Disease

## 2018-07-11 DIAGNOSIS — I671 Cerebral aneurysm, nonruptured: Principal | ICD-10-CM

## 2018-07-11 LAB — BASIC METABOLIC PANEL
ANION GAP: 8 mmol/L — ABNORMAL LOW (ref 9–15)
BUN / CREAT RATIO: 14
CALCIUM: 8.4 mg/dL — ABNORMAL LOW (ref 8.5–10.2)
CHLORIDE: 106 mmol/L (ref 98–107)
CO2: 24 mmol/L (ref 22.0–30.0)
CREATININE: 0.51 mg/dL — ABNORMAL LOW (ref 0.60–1.00)
EGFR CKD-EPI AA FEMALE: >90 mL/min/{1.73_m2} (ref >=60)
EGFR CKD-EPI NON-AA FEMALE: >90 mL/min/{1.73_m2} (ref >=60)
GLUCOSE RANDOM: 98 mg/dL (ref 65–179)
POTASSIUM: 3.5 mmol/L (ref 3.5–5.0)
SODIUM: 138 mmol/L (ref 135–145)

## 2018-07-11 LAB — CBC
HEMATOCRIT: 29.6 % — ABNORMAL LOW (ref 36.0–46.0)
HEMATOCRIT: 27.7 % — ABNORMAL LOW (ref 36.0–46.0)
HEMOGLOBIN: 9.9 g/dL — ABNORMAL LOW (ref 12.0–16.0)
HEMOGLOBIN: 9.3 g/dL — ABNORMAL LOW (ref 12.0–16.0)
MEAN CORPUSCULAR HEMOGLOBIN CONC: 33.6 g/dL (ref 31.0–37.0)
MEAN CORPUSCULAR HEMOGLOBIN CONC: 33.5 g/dL (ref 31.0–37.0)
MEAN CORPUSCULAR HEMOGLOBIN: 31.6 pg (ref 26.0–34.0)
MEAN CORPUSCULAR HEMOGLOBIN: 31.6 pg (ref 26.0–34.0)
MEAN CORPUSCULAR VOLUME: 94 fL (ref 80.0–100.0)
MEAN CORPUSCULAR VOLUME: 94.1 fL (ref 80.0–100.0)
MEAN PLATELET VOLUME: 8 fL (ref 7.0–10.0)
MEAN PLATELET VOLUME: 8.8 fL (ref 7.0–10.0)
PLATELET COUNT: 153 10*9/L (ref 150–440)
RED BLOOD CELL COUNT: 3.15 10*12/L — ABNORMAL LOW (ref 4.00–5.20)
RED BLOOD CELL COUNT: 2.94 10*12/L — ABNORMAL LOW (ref 4.00–5.20)
RED CELL DISTRIBUTION WIDTH: 13.3 % (ref 12.0–15.0)
RED CELL DISTRIBUTION WIDTH: 13.4 % (ref 12.0–15.0)
WBC ADJUSTED: 7.4 10*9/L (ref 4.5–11.0)

## 2018-07-11 LAB — BLOOD GAS, ARTERIAL
BASE EXCESS ARTERIAL: 1.1 (ref -2.0–2.0)
HCO3 ARTERIAL: 26 mmol/L (ref 22–27)
HCO3 ARTERIAL: 26 mmol/L (ref 22–27)
PCO2 ARTERIAL: 42.4 mmHg (ref 35.0–45.0)
PCO2 ARTERIAL: 47.8 mmHg — ABNORMAL HIGH (ref 35.0–45.0)
PH ARTERIAL: 7.41 (ref 7.35–7.45)
PO2 ARTERIAL: 61.7 mmHg — ABNORMAL LOW (ref 80.0–110.0)
PO2 ARTERIAL: 97.5 mmHg (ref 80.0–110.0)

## 2018-07-11 LAB — INR: Lab: 1

## 2018-07-11 LAB — HEMOGLOBIN AND HEMATOCRIT, BLOOD: HEMATOCRIT: 26.6 % — ABNORMAL LOW (ref 36.0–46.0)

## 2018-07-11 LAB — APTT
APTT: 30.8 s (ref 25.9–39.5)
Coagulation surface induced:Time:Pt:PPP:Qn:Coag: 30.8
HEPARIN CORRELATION: 0.2

## 2018-07-11 LAB — MAGNESIUM: Magnesium:MCnc:Pt:Ser/Plas:Qn:: 1.6

## 2018-07-11 LAB — PROTIME: Lab: 11.2

## 2018-07-11 LAB — BASE EXCESS ARTERIAL: Base excess:SCnc:Pt:BldA:Qn:Calculated: 1.1

## 2018-07-11 LAB — WBC ADJUSTED: Lab: 7.4

## 2018-07-11 LAB — HEMOGLOBIN: Lab: 9.1 — ABNORMAL LOW

## 2018-07-11 LAB — HCO3 ARTERIAL: Bicarbonate:SCnc:Pt:BldA:Qn:: 26

## 2018-07-11 LAB — RED BLOOD CELL COUNT: Lab: 2.94 — ABNORMAL LOW

## 2018-07-11 LAB — PROTIME-INR: PROTIME: 11.2 s (ref 10.2–12.8)

## 2018-07-11 LAB — CALCIUM IONIZED ARTERIAL (MG/DL): Calcium.ionized:MCnc:Pt:Bld:Qn:: 4.56

## 2018-07-11 LAB — PHOSPHORUS: Phosphate:MCnc:Pt:Ser/Plas:Qn:: 3

## 2018-07-11 LAB — CHLORIDE: Chloride:SCnc:Pt:Ser/Plas:Qn:: 106

## 2018-07-11 NOTE — Telephone Encounter (Signed)
lmov to change appt to 08-15 at 420  Due to provider changes

## 2018-07-11 NOTE — Unmapped (Addendum)
NEUROSURGERY PROGRESS NOTE    Brief History of Present Illness  Jennifer Lewis is a 57 y.o. female with history of left ophthalmic aneurysm s/p pipeline embolization (04/25/2017) now s/p coil embolization of right cavernous ICA aneurysm (07/09/18).    Subjective/Interval History  New oxygen requirement and tachycardia overnight. Exam otherwise stable. Hemoglobin stable.    Interval Imaging Reviewed  CTA abdomen/pelvis--retroperitoneal and peritoneal hemorrhage within right hemipelvis    Neurological Assessment and Plan  **Left ophthalmic aneurysm and right cavernous ICA aneurysm  - PED 2 s/p coil embolization of right cavernous ICA aneurysm (07/09/18)  - Na>135  - SBP<180  - Holding ASA in setting of retroperitoneal bleed  - Serial hemoglobins per vascular surgery  - Received 1 pRBC and 1 FFP yesterday  - Tachycardic and increasing O2 requirements overnight: will likely need CTA chest this AM to eval for PE    Disposition: NSICU.    This patient is co-managed with the [facility]. The management of this patient was discussed in detail with them and we agree with their overall assessment and plan.      ___________________________________________________________________    Neurological Exam  EOSp  Ox3  PERRL  EOMI  F=  TML  5/5 x 4  No PND    The patient's vitals, intake/output, labs, orders, and relevant imaging were reviewed for the last 24 hours.    Problem List  Active Problems:    * No active hospital problems. *  Resolved Problems:    * No resolved hospital problems. *

## 2018-07-11 NOTE — Unmapped (Signed)
Neurocritical Care   Progress Note     CODE STATUS:  FULL    Date of service: 07/11/2018  Hospital Day:  LOS: 2 days             Assessment and Plan     Jennifer Lewis is a 57 y.o. 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 The patient is admitted to NSICU following coil embolization of right ICA aneurysm.    24 Hour Events: The patient was noted to have worsening abdominal pain and tachycardia overnight.     Neuro:  Daily interruption of sedation: N/A  Multi-Modality Monitoring: none  Current anticonvulsants: not indicated.  Mobilty protocol current stage: 2  Splints/PRAFO:  No  Restraints:  Not indicated  CAM-ICU:  Negative    **Right ICA aneurysm s/p coil embolization PED#1  - clinically with intact neurologic exam  - holding home ASA in the setting of retroperitoneal hematoma  - SBP goal <160  - Sodium goal >135, checking daily  - PT/OT consults when stable, defer today  - patient remains at risk for neurological decline due to post-op hemorrhage, cerebral edema, and seizures and requires close monitoring in the NSICU     **Depression  - Continue home Lexapro    **MS  - Continue home Tecfidera and PRN Baclofen  - Continue Tegretol for paryoxysmal sensory MS symptoms     **Pain/sedation  - tylenol prn mild pain  - oxycodone prn moderate pain  - morphine prn severe pain       Recent Labs   Lab Units 07/11/18  0122   SODIUM mmol/L 138         Cardiovascular:  Admission weight: 72  Daily Weight: 72.1 kg (158 lb 15.2 oz)   A BP-2: (104-170)/(50-74) 153/70  MAP:  [68 mmHg-105 mmHg] 96 mmHg  Cardiac enzymes: No results in the last day      **Hemodynamic goals  - Current vasoactive medications:  None  - MAP > 65  - goal SBP < 160 to reduce risk of post-op hemorrhage  - hydralazine prn  - troponins unremarkable, EKG normal sinus rhythm    **Hyperlipidemia  - Continue atorvastatin 20 mg daily (home med)    **Tachycardia  - suspect acute blood loss      Heme  Current DVT prophylaxis: SCDs, holding lovenox in the setting of active bleeding    **Right Retroperitoneal Hematoma  - in the setting of recent groin puncture for cerebral angiogram  - PVL groin pseudoaneurysm protocol found no vascular abnormality  - CTA abd/pelvis confirmed retroperitoneal and peritoneal hemorrhage of the right hemipelvis  - AM exam and vitals concerning for recurring hemorrhage, obtain repeat CTA Abd/pelvis  - continue to trend hemoglobin Q6   - obtain coags this AM     Recent Labs   Lab Units 07/11/18  0122 07/10/18  2143 07/10/18  1635 07/10/18  1017 07/10/18  0313 07/09/18  1402   HEMOGLOBIN g/dL 9.9* 9.7* 8.6* 8.5* 9.5* 12.4   PLATELET COUNT (1) 10*9/L 175  --   --   --  198 185     Lab Results   Component Value Date    INR 0.97 07/10/2018    APTT 28.9 07/10/2018         Pulmonary  The HOB is elevated to greater than 30 degrees.  O2/Vent Settings:   O2  Device: Nasal cannula  O2 Flow Rate (L/min):  [2 L/min-4 L/min] 4 L/min    CXR: (personally reviewed) 7/18 - demonstrated opacity of the left costophrenic angle suspected to represent atelectasis vs. infection    **COPD   - O2 [place] 4L (3L yesterday), wean as able  - IS q2 while awake  - Tbitrate O2 for sat > 88%  - Schedule duonebs   - ABG completed with PO2 61, no hypercarbia      **Tobacco use  - smoking cessation counseling  - nicotine      GI  Weight: 72.1 kg (158 lb 15.2 oz)   Body mass index is 26.45 kg/m??.    GI prophylaxis: not indicated  The patient's last recorded bowel movement: PTA  Bowel regimen: colace  Current nutritional source: NPO      **Nutrition  - NPO secondary to severe abdominal pain    **Nausea  - zofran prn    Most recent LFTs:  Lab Results   Component Value Date    AST 33 01/02/2018    ALT 29 01/02/2018    ALKPHOS 106 01/02/2018    BILITOT 0.7 01/02/2018    PROT 7.7 01/02/2018    ALBUMIN 4.5 01/02/2018 Renal/Endocrine    Intake/Output Summary (Last 24 hours) at 07/11/2018 0720  Last data filed at 07/11/2018 0600  Gross per 24 hour   Intake 3113.75 ml   Output 2280 ml   Net 833.75 ml     The patient current insulin regimen for blood glucose control:  SSI  Foley catheter: Foley required for accurate continuous I/O's.      - obtain daily chemistry and supplement electrolytes as needed     **Glycemic control  - discontinue SSI, no need      **Fluids  - goal euvolemia, NS at 75 cc/hour while NPO   - foley required for strict ins/outs in the setting of active hemorrhage and hypovolemia    **Hypokalemia   - replete    **Hypomagnesemia  - replete    **Hypocalcemia  - check ionized calcium      Recent Labs   Lab Units 07/11/18  0122 07/10/18  0313 07/09/18  1402   CREATININE mg/dL 1.61* 0.96 0.45*     No results found for: A1C  Potassium   Date Value Ref Range Status   07/11/2018 3.5 3.5 - 5.0 mmol/L Final     Magnesium   Date Value Ref Range Status   07/11/2018 1.6 1.6 - 2.2 mg/dL Final     Phosphorus   Date Value Ref Range Status   07/11/2018 3.0 2.9 - 4.7 mg/dL Final           ID  Temp (24hrs), Avg:37.3 ??C (99.2 ??F), Min:37 ??C (98.6 ??F), Max:37.5 ??C (99.5 ??F)    Recent Labs   Lab Units 07/11/18  0122 07/10/18  0313 07/09/18  1402   WBC 10*9/L 7.4 7.6 6.7       - no current s/s of infection  - culture if temp > 38.5      Current Access:         -  PIV x 2       - Arterial line: Arterial line required for BP monitoring.       - Central venous line: No central line present.  Current antibiotics:  ??? acyclovir (ZOVIRAX) tablet 400 mg Oral Nightly     Cultures:  No results found for: BLOOD CULTURE, URINE CULTURE, LOWER RESPIRATORY CULTURE  WBC (10*9/L)   Date Value   07/11/2018 7.4        Last CSF Analysis: No results found for: COLORCSF, APPEARCSF, NUCCELLSCSF, RBCCSF, NCSF, LYMPHSCSF, MONOMACCSF, EOSCSF, OTHERCSF, SPUNA, PROTCSF, GLUCCSF    Tubes and drains:  Patient Lines/Drains/Airways Status    Active Active Lines, Drains, & Airways     Name:   Placement date:   Placement time:   Site:   Days:    Urethral Catheter Non-latex;Temperature probe 16 Fr.   07/09/18    1055    Non-latex;Temperature probe   1    Peripheral IV 12/28/17 Right Antecubital   12/28/17    0930    Antecubital   194    Peripheral IV 07/09/18 Left Hand   07/09/18    1320    Hand   1    Peripheral IV 07/10/18 Right Forearm   07/10/18    0525    Forearm   1    Arterial Line 07/09/18 Left Radial   07/09/18    1327    Radial   1                         Objective Data        Temp:  [37 ??C (98.6 ??F)-37.5 ??C (99.5 ??F)] 37.3 ??C (99.1 ??F)  Heart Rate:  [88-127] 102  SpO2 Pulse:  [89-127] 102  Resp:  [15-28] 21  BP: (130)/(52) 130/52  MAP (mmHg):  [76] 76  A BP-2: (104-170)/(50-74) 153/70  MAP:  [68 mmHg-105 mmHg] 96 mmHg  SpO2:  [87 %-95 %] 90 %    Intake/Output Summary (Last 24 hours) at 07/11/2018 0720  Last data filed at 07/11/2018 0600  Gross per 24 hour   Intake 3113.75 ml   Output 2280 ml   Net 833.75 ml     SpO2: 90 %  Height: 165.1 cm (5' 5)   Weight: 72.1 kg (158 lb 15.2 oz)   Body mass index is 26.45 kg/m??.         Hemodynamic/Invasive Device Data (24 hrs):  A BP-2: (104-170)/(50-74) 153/70  MAP:  [68 mmHg-105 mmHg] 96 mmHg    Ventilation/Oxygen Therapy (24hrs):  O2 Device: Nasal cannula  O2 Flow Rate (L/min):  [2 L/min-4 L/min] 4 L/min        Medications:  Scheduled medications:   ??? acyclovir  400 mg Oral Nightly   ??? atorvastatin  20 mg Oral Nightly   ??? carBAMazepine  200 mg Oral TID   ??? cholecalciferol (vitamin D3)  1,000 Units Oral Daily   ??? dimethyl fumarate  240 mg Oral BID   ??? docusate sodium  100 mg Oral BID   ??? escitalopram oxalate  10 mg Oral Daily   ??? fluticasone propion-salmeterol  1 puff Inhalation BID   ??? nicotine  1 patch Transdermal Daily     Continuous infusions:   ??? sodium chloride 75 mL/hr (07/11/18 0600)     PRN medications:   acetaminophen, acetaminophen, albuterol, baclofen, labetalol, MORPhine injection, ondansetron, oxyCODONE Physical Exam     All vital signs for the past 24 hours have been reviewed.  All laboratory studies resulted in the past 24 hours have been reviewed.     General Exam:  General: Awake and alert.   Appears comfortable. No obvious distress.   ENT:  Mucous membranes moist. Oropharynx clear.  Cardiovascular:  Regular rate and rhythm.  No murmurs.   2+ radial pulses bilaterally.  Respiratory: Breathing is comfortable and unlabored.  Lungs are clear to ausculation bilaterally.    Gastrointestinal: soft diffusely tender throughout  Extremities: Warm and well-perfused. No cyanosis, clubbing, or edema.  Skin: No obvious rashes or ecchymoses.    Neurological Exam:  Mental Status  LOC: awake and alert  Orientation: person, place and time  Neglect: not present  Speech: normal  Language: fluent with intact naming, repetition, comprehension    Cranial Nerves  Pupils: PERRL  Corneals: present bilaterally  Gaze: normal  Face Sensory: intact and symmetric  Face Motor: normal and symmetric  Cough: strong  Tongue: midline    Motor:   RUE: commands and 5/5  LUE: commands and 5/5  RLE: commands and 5/5  LLE: commands and 5/5     Sensory:    Sensory is intact to light touch    Glasgow Coma Score  Motor: obeys commands = 6  Verbal: oriented = 5  Eyes: eyes open spontaneously = 4        DISPOSITION     The patient requires admission to the [facility] for management of the above conditions.    Estimated Transfer Date:  07/13/18        BILLING     Hillery Hunter, MD  Neurocritical Care Fellow  07/11/18 7:55 AM

## 2018-07-11 NOTE — Unmapped (Signed)
Pt has remained alert and oriented x4, and full strength in all extremities. She has remained <160 with PRN Hydralazine and Labetalol given. Remained in ST with provider notified. Bolus given x1. Pt remains on oxygen at 4 L. Foley in place. Clear liquid diet. Alarms active and audible. See flowsheets for further details. Will continue to monitor.     Problem: Adult Inpatient Plan of Care  Goal: Plan of Care Review  Outcome: Progressing  Goal: Patient-Specific Goal (Individualization)  Outcome: Progressing  Goal: Absence of Hospital-Acquired Illness or Injury  Outcome: Progressing  Goal: Optimal Comfort and Wellbeing  Outcome: Progressing  Goal: Readiness for Transition of Care  Outcome: Progressing  Goal: Rounds/Family Conference  Outcome: Progressing     Problem: Skin Injury Risk Increased  Goal: Skin Health and Integrity  Outcome: Progressing     Problem: Pain Chronic (Persistent)  Goal: Acceptable Pain Control and Functional Ability  Outcome: Progressing     Problem: Fall Injury Risk  Goal: Absence of Fall and Fall-Related Injury  Outcome: Progressing     Problem: Chronic Pain (Multiple Sclerosis)  Goal: Acceptable Pain Control  Outcome: Progressing     Problem: Fatigue (Multiple Sclerosis)  Goal: Optimal Energy for Daily Activity  Outcome: Progressing     Problem: Mobility Impairment (Multiple Sclerosis)  Goal: Safety with Mobility  Outcome: Progressing     Problem: Self-Care Deficit (Multiple Sclerosis)  Goal: Ability to Complete Activities of Daily Living  Outcome: Progressing     Problem: COPD Comorbidity  Goal: Maintenance of COPD Symptom Control  Outcome: Progressing     Problem: Hypertension Comorbidity  Goal: Blood Pressure in Desired Range  Outcome: Progressing     Problem: Pain Chronic (Persistent) (Comorbidity Management)  Goal: Acceptable Pain Control and Functional Ability  Outcome: Progressing     Problem: Breathing Pattern Ineffective  Goal: Effective Breathing Pattern  Outcome: Progressing

## 2018-07-11 NOTE — Unmapped (Signed)
Neurocritical Care   Supplementary Progress Note        ASSESSMENT / PLAN     Jennifer Lewis is a 57 y.o. female admitted to NSICU with a diagnosis of ICA aneurysm.  Please see today's progress note for full details.  Ongoing management of the active conditions and plan is below.    **Right ICA aneurysm, PED #1  - SBP < 160 (d/t RP bleed)  - sodium > 135, currently 138  - holding ASA in the setting of RP bleed    **Retroperitoneal bleed  - CTA this morning with retroperitoneal and peritoneal hemorrhage within the right pelvis and abdomen  - right groin site remains soft with no hematoma noted, distal pulse intact  - serial hemoglobins per vascular surgery  - hgb stable at 9.9 this morning  - s/p 1 PRBC and 1 FFP yesterday    **Tachycardia  - tachycardic overnight into the 120's, increasing oxygen requirements  - given bolus and prn pain meds with no relief  - currently bed rest, holding aspirin and holding VTE prophylaxis due to RP bleed  - concern for possible pulmonary embolism  - unable to get contrast load until 24 hrs post CTA abdomen  - plan for CTA chest this morning @ 24 hr mark (~6am)  - if CTA positive for PE will have to discuss plan/risk with vascular surgery regarding treatment given RP bleed         OBJECTIVE DATA       Vitals Reviewed:   Temp:  [37 ??C (98.6 ??F)-37.5 ??C (99.5 ??F)] 37.5 ??C (99.5 ??F)  Heart Rate:  [80-127] 112  SpO2 Pulse:  [89-127] 112  Resp:  [15-28] 24  BP: (121-160)/(57-127) 160/127  MAP (mmHg):  [80-134] 134  A BP-2: (104-182)/(50-84) 146/67  MAP:  [68 mmHg-114 mmHg] 90 mmHg  SpO2:  [87 %-97 %] 91 %  Temp (24hrs), Avg:37.3 ??C (99.2 ??F), Min:37 ??C (98.6 ??F), Max:37.5 ??C (99.5 ??F)    SpO2: 91 %  Height: 165.1 cm (5' 5)   Weight: 72.1 kg (158 lb 15.2 oz)   Body mass index is 26.45 kg/m??.    Body surface area is 1.82 meters squared.     Continuous Infusions:  ??? sodium chloride 75 mL/hr (07/11/18 0200)       Ventilator settings:  O2 Device: Nasal cannula  O2 Flow Rate (L/min):  [2 L/min-4 L/min] 4 L/min        NEURO EXAM        Mental status:   LOC: Awake and alert  Orientation: person, place, time  Speech: Normal  Language: normal  ??  Cranial nerves:   Pupils: PERRL  Gaze: normal  Face sensory: normal  Face motor: normal  Vestibular: normal  Cough: normal  Tongue: midline,   ??  Motor:   RUE: commands and 5/5  LUE: commands and 5/5  RLE: commands and 5/5  LLE: commands and 5/5   ??  Sensory: intact  ??         BILLING       This patient is critically ill or injured with the impairment of vital organ systems such that there is a high probability of imminent or life threatening deterioration in the patient's condition. The reasons the patient is critically ill and the nature of the treatment and management provided to manage the critically ill patient as above.  I directly provided 35 minutes of critical care time as documented in this note.  Time includes: direct patient care, patient reassessment, coordination of patient care, interpretation of data, review of patient medical records, and documentation of patient care. This time is exclusive of separately billable procedures.      Charolotte Capuchin, ACNP  Neurocritical Care

## 2018-07-11 NOTE — Unmapped (Signed)
Neurocritical Care   Supplementary Progress Note        ASSESSMENT / PLAN     Jennifer Lewis is a 57 y.o. female admitted to NSICU with a diagnosis of of right cavernous internal carotid artery aneurysm s/p coil embolization.  Please see today's progress note for full details.  Ongoing management of the active conditions and plan is below.    **Right ICA aneurysm s/p coil embolization PED#1  - clinically with intact neurologic exam  - holding home ASA in the setting of retroperitoneal hematoma  - SBP goal <160  - Sodium goal >135, checking daily  - PT/OT consults when stable, defer today  - patient remains at risk for neurological decline due to post-op hemorrhage, cerebral edema, and seizures and requires close monitoring in the NSICU    **Retroperitoneal bleed  - repeat CTA this morning with stable retroperitoneal and peritoneal hemorrhage within the right pelvis and abdomen  - right groin site remains soft with no hematoma noted, distal pulse intact  - serial hemoglobins per vascular surgery  - hgb stable at 9.9 this morning, 9.1 this afternoon  - s/p 1 PRBC and 1 FFP yesterday    **Tachycardia  - tachycardic overnight into the 120's, increasing oxygen requirements  - resolved with more frequent pain medication today    **Hypoxia  - now stable on 4L [place]  - incentive spirometry q1 hr while awake  - schedule duonebs q6 hrs  - start treatment for COPD exacerbation if hypoxia recurs         OBJECTIVE DATA       Vitals Reviewed:   Temp:  [37 ??C (98.6 ??F)-37.5 ??C (99.5 ??F)] 37.3 ??C (99.1 ??F)  Heart Rate:  [88-127] 99  SpO2 Pulse:  [89-127] 99  Resp:  [12-28] 22  BP: (122-130)/(52-53) 122/53  MAP (mmHg):  [75-76] 75  A BP-2: (104-183)/(50-79) 140/70  MAP:  [68 mmHg-113 mmHg] 94 mmHg  SpO2:  [87 %-95 %] 89 %  Temp (24hrs), Avg:37.3 ??C (99.2 ??F), Min:37 ??C (98.6 ??F), Max:37.5 ??C (99.5 ??F)    SpO2: (!) 89 %  Height: 165.1 cm (5' 5)   Weight: 72.1 kg (158 lb 15.2 oz)   Body mass index is 26.45 kg/m??.    Body surface area is 1.82 meters squared.     Continuous Infusions:  ??? sodium chloride 75 mL/hr (07/11/18 0600)       Ventilator settings:  O2 Device: Nasal cannula  O2 Flow Rate (L/min):  [2 L/min-4 L/min] 3 L/min        NEURO EXAM      Mental status:   LOC: Awake and alert  Orientation: person, place, time  Speech: Normal  Language: normal    Cranial nerves:   Pupils: PERRL  Corneal reflex: normal  Gaze: normal  Face sensory: normal  Face motor: normal  Tongue: midline,     Motor:   RUE: commands and 5/5  LUE: commands and 5/5  RLE: commands and 5/5  LLE: commands and 5/5     Sensory: Intact       BILLING       This patient is critically ill or injured with the impairment of vital organ systems such that there is a high probability of imminent or life threatening deterioration in the patient's condition. The reasons the patient is critically ill and the nature of the treatment and management provided to manage the critically ill patient as above.  I directly provided 33 minutes  of critical care time as documented in this note. Time includes: direct patient care, patient reassessment, coordination of patient care, interpretation of data, review of patient medical records, and documentation of patient care. This time is exclusive of separately billable procedures.    Molli Barrows, AGACNP-BC  Neurocritical Care

## 2018-07-11 NOTE — Unmapped (Signed)
Patient is alert and oriented.  Following commands.  Pupils equal and reactive.  Positive cough.  PRNs given for pain.  Vitals stable.  Foley remains in place.  1 unit of blood given.  Will continue to assess.    Problem: Adult Inpatient Plan of Care  Goal: Plan of Care Review  Outcome: Progressing  Goal: Patient-Specific Goal (Individualization)  Outcome: Progressing  Goal: Absence of Hospital-Acquired Illness or Injury  Outcome: Progressing  Goal: Optimal Comfort and Wellbeing  Outcome: Progressing  Goal: Readiness for Transition of Care  Outcome: Progressing  Goal: Rounds/Family Conference  Outcome: Progressing     Problem: Skin Injury Risk Increased  Goal: Skin Health and Integrity  Outcome: Progressing     Problem: Pain Chronic (Persistent)  Goal: Acceptable Pain Control and Functional Ability  Outcome: Progressing     Problem: Fall Injury Risk  Goal: Absence of Fall and Fall-Related Injury  Outcome: Progressing     Problem: Chronic Pain (Multiple Sclerosis)  Goal: Acceptable Pain Control  Outcome: Progressing     Problem: Fatigue (Multiple Sclerosis)  Goal: Optimal Energy for Daily Activity  Outcome: Progressing     Problem: Mobility Impairment (Multiple Sclerosis)  Goal: Safety with Mobility  Outcome: Progressing     Problem: Self-Care Deficit (Multiple Sclerosis)  Goal: Ability to Complete Activities of Daily Living  Outcome: Progressing     Problem: COPD Comorbidity  Goal: Maintenance of COPD Symptom Control  Outcome: Progressing     Problem: Hypertension Comorbidity  Goal: Blood Pressure in Desired Range  Outcome: Progressing     Problem: Pain Chronic (Persistent) (Comorbidity Management)  Goal: Acceptable Pain Control and Functional Ability  Outcome: Progressing     Problem: Breathing Pattern Ineffective  Goal: Effective Breathing Pattern  Outcome: Progressing

## 2018-07-12 DIAGNOSIS — I671 Cerebral aneurysm, nonruptured: Principal | ICD-10-CM

## 2018-07-12 LAB — BASIC METABOLIC PANEL
ANION GAP: 6 mmol/L — ABNORMAL LOW (ref 9–15)
BUN / CREAT RATIO: 13
CALCIUM: 8.4 mg/dL — ABNORMAL LOW (ref 8.5–10.2)
CHLORIDE: 104 mmol/L (ref 98–107)
CO2: 26 mmol/L (ref 22.0–30.0)
CREATININE: 0.52 mg/dL — ABNORMAL LOW (ref 0.60–1.00)
EGFR CKD-EPI AA FEMALE: >90 mL/min/{1.73_m2} (ref >=60)
EGFR CKD-EPI NON-AA FEMALE: >90 mL/min/{1.73_m2} (ref >=60)
GLUCOSE RANDOM: 105 mg/dL (ref 65–179)
POTASSIUM: 3.6 mmol/L (ref 3.5–5.0)
SODIUM: 136 mmol/L (ref 135–145)

## 2018-07-12 LAB — CBC
HEMATOCRIT: 27.6 % — ABNORMAL LOW (ref 36.0–46.0)
HEMOGLOBIN: 9.2 g/dL — ABNORMAL LOW (ref 12.0–16.0)
MEAN CORPUSCULAR HEMOGLOBIN CONC: 33.2 g/dL (ref 31.0–37.0)
MEAN CORPUSCULAR HEMOGLOBIN: 31.8 pg (ref 26.0–34.0)
MEAN PLATELET VOLUME: 9.1 fL (ref 7.0–10.0)
PLATELET COUNT: 171 10*9/L (ref 150–440)
RED BLOOD CELL COUNT: 2.88 10*12/L — ABNORMAL LOW (ref 4.00–5.20)
RED CELL DISTRIBUTION WIDTH: 13.5 % (ref 12.0–15.0)
WBC ADJUSTED: 8.4 10*9/L (ref 4.5–11.0)

## 2018-07-12 LAB — MEAN CORPUSCULAR HEMOGLOBIN CONC: Lab: 33.2

## 2018-07-12 LAB — MAGNESIUM: Magnesium:MCnc:Pt:Ser/Plas:Qn:: 1.7

## 2018-07-12 LAB — PHOSPHORUS: Phosphate:MCnc:Pt:Ser/Plas:Qn:: 3

## 2018-07-12 LAB — CHLORIDE: Chloride:SCnc:Pt:Ser/Plas:Qn:: 104

## 2018-07-12 MED ORDER — TECFIDERA 240 MG CAPSULE,DELAYED RELEASE
ORAL_CAPSULE | PRN refills | 0 days | Status: CP
Start: 2018-07-12 — End: 2018-07-12

## 2018-07-12 MED ORDER — DIMETHYL FUMARATE 240 MG CAPSULE,DELAYED RELEASE
ORAL_CAPSULE | ORAL | 99 refills | 0 days | Status: CP
Start: 2018-07-12 — End: 2019-06-22
  Filled 2018-08-22: qty 60, 30d supply, fill #0

## 2018-07-12 NOTE — Telephone Encounter (Signed)
Pt called back and rescheduled

## 2018-07-12 NOTE — Unmapped (Signed)
Vancomycin Therapeutic Monitoring Pharmacy Note    Jennifer Lewis is a 57 y.o. female starting vancomycin. Date of therapy initiation: 07/12/2018    Indication: Pneumonia    Prior Dosing Information: None/new initiation     Goals:  Therapeutic Drug Levels  Vancomycin trough goal: 15-20 mg/L    Additional Clinical Monitoring/Outcomes  Renal function, volume status (intake and output)    Results: Not applicable    Wt Readings from Last 1 Encounters:   07/12/18 71.4 kg (157 lb 8 oz)     Creatinine   Date Value Ref Range Status   07/12/2018 0.52 (L) 0.60 - 1.00 mg/dL Final   16/09/9603 5.40 (L) 0.60 - 1.00 mg/dL Final   98/10/9146 8.29 0.60 - 1.00 mg/dL Final        Pharmacokinetic Considerations and Significant Drug Interactions:  ? Adult (estimated initial): Vd = 50.694 L, ke = 0.0767 hr-1  ? Concurrent nephrotoxic meds: not applicable    Assessment/Plan:  Recommended Dose  ? Start vancomycin 1250mg  IV q12hr  ? Estimated trough on recommended regimen: 17 mg/L    Follow-up  ? Level due: prior to fourth or fifth dose  ? A pharmacist will continue to monitor and order levels as appropriate    Please page service pharmacist with questions/clarifications.    Alecia Lemming, PharmD

## 2018-07-12 NOTE — Unmapped (Signed)
Pt has been oriented x4 this shift. She has been alert until around 0400 this morning she became more drowsy, with providers notified and at bedside. Pt remained oriented and full strength in all extremities. Blood cultures drawn. Pt SBP has maintained <180. She has been ST this shift with providers notified. She is currently on a facemask to maintain SpO2 >88%. Clear liquid diet, Foley in place. See flowsheets for further details. Alarms active and audible. Will continue to monitor.       Problem: Adult Inpatient Plan of Care  Goal: Plan of Care Review  Outcome: Progressing  Goal: Patient-Specific Goal (Individualization)  Outcome: Progressing  Goal: Absence of Hospital-Acquired Illness or Injury  Outcome: Progressing  Goal: Optimal Comfort and Wellbeing  Outcome: Progressing  Goal: Readiness for Transition of Care  Outcome: Progressing  Goal: Rounds/Family Conference  Outcome: Progressing     Problem: Skin Injury Risk Increased  Goal: Skin Health and Integrity  Outcome: Progressing     Problem: Pain Chronic (Persistent)  Goal: Acceptable Pain Control and Functional Ability  Outcome: Progressing     Problem: Fall Injury Risk  Goal: Absence of Fall and Fall-Related Injury  Outcome: Progressing     Problem: Chronic Pain (Multiple Sclerosis)  Goal: Acceptable Pain Control  Outcome: Progressing     Problem: Fatigue (Multiple Sclerosis)  Goal: Optimal Energy for Daily Activity  Outcome: Progressing     Problem: Mobility Impairment (Multiple Sclerosis)  Goal: Safety with Mobility  Outcome: Progressing     Problem: Self-Care Deficit (Multiple Sclerosis)  Goal: Ability to Complete Activities of Daily Living  Outcome: Progressing     Problem: COPD Comorbidity  Goal: Maintenance of COPD Symptom Control  Outcome: Progressing     Problem: Hypertension Comorbidity  Goal: Blood Pressure in Desired Range  Outcome: Progressing     Problem: Pain Chronic (Persistent) (Comorbidity Management)  Goal: Acceptable Pain Control and Functional Ability  Outcome: Progressing     Problem: Breathing Pattern Ineffective  Goal: Effective Breathing Pattern  Outcome: Progressing

## 2018-07-12 NOTE — Unmapped (Signed)
Pt remains in NSICU this shift. C/o increasing abdominal pain this morning, STAT CT to evaluate RP bleed, no changes noted on scan. PRN pain medication given throughout shift when available. Pt appeared more comfortable around noon and for remainder of shift. Pt given IS and initial instructions on use, pt using IS appropriately throughout shift with increase in paO2 noted on 1600 ABG. No changes to neuro exam. Pt placed back on clear liquid diet this afternoon. Husband at bedside throughout shift, updated on pt's condition and plan of care, all questions answered.

## 2018-07-12 NOTE — Unmapped (Signed)
Neurocritical Care   Progress Note     CODE STATUS:  FULL    Date of service: 07/12/2018  Hospital Day:  LOS: 3 days             Assessment and Plan     Jennifer Lewis is a 57 y.o. 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 The patient is admitted to NSICU following coil embolization of right ICA aneurysm.    24 Hour Events: She continues to have the same abdominal pain that is poorly controlled    Neuro:  Daily interruption of sedation: N/A  Multi-Modality Monitoring: none  Current anticonvulsants: not indicated.  Mobilty protocol current stage: 3  Splints/PRAFO:  No  Restraints:  Not indicated  CAM-ICU:  Negative    **Right ICA aneurysm s/p coil embolization  - clinically with intact neurologic exam  - holding home ASA in the setting of retroperitoneal hematoma  - SBP goal <160  - Sodium goal >135, checking daily  - PT/OT consults when stable, defer today   - patient remains at risk for neurological decline due to post-op hemorrhage, cerebral edema, and seizures and requires close monitoring in the NSICU     **Depression  - Continue home Lexapro    **MS  - Continue home Tecfidera and PRN Baclofen  - Continue Tegretol for paryoxysmal sensory MS symptoms     **Pain/sedation  - tylenol prn mild pain  - oxycodone prn moderate pain  - morphine prn severe pain   - Schedule oxycodone aswell    Recent Labs   Lab Units 07/12/18  0339   SODIUM mmol/L 136         Cardiovascular:  Admission weight: 72  Daily Weight: 71.4 kg (157 lb 8 oz)   A BP-2: (103-178)/(51-84) 147/63  MAP:  [0 mmHg-109 mmHg] 86 mmHg  Cardiac enzymes: No results in the last day      **Hemodynamic goals  - Current vasoactive medications:  None  - MAP > 65  - goal SBP < 160 to reduce risk of post-op hemorrhage  - Troponin unremarkable, EKG normal sinus rhythm    **Hyperlipidemia  - Continue atorvastatin 20 mg daily (home med)      Heme  Current DVT prophylaxis: SCDs, holding lovenox in the setting of active bleeding    **Right Retroperitoneal Hematoma  - in the setting of recent groin puncture for cerebral angiogram  - PVL groin pseudoaneurysm protocol found no vascular abnormality  - CTA abd/pelvis confirmed retroperitoneal and peritoneal hemorrhage of the right hemipelvis  - Continue to hold Lovenox        Recent Labs   Lab Units 07/12/18  0339 07/11/18  1605 07/11/18  0941 07/11/18  0122  07/10/18  0313   HEMOGLOBIN g/dL 9.2* 9.1* 9.3* 9.9*   < > 9.5*   PLATELET COUNT (1) 10*9/L 171  --  153 175  --  198    < > = values in this interval not displayed.     Lab Results   Component Value Date    INR 0.98 07/11/2018    APTT 30.8 07/11/2018       Pulmonary  The HOB is elevated to greater than 30 degrees.  O2/Vent Settings:   FiO2 (%):  [50 %-60 %] 60 %  O2 Device: HFNC  O2 Flow Rate (L/min):  [4 L/min-40 L/min] 40 L/min  CXR: (personally reviewed) 7/18 - demonstrated opacity of the left costophrenic angle suspected to represent atelectasis vs. infection    **COPD Exacerbation  - O2 [employer] 4L (3L yesterday), wean as able  - ABG completed with PO2 61, no hypercarbia  - IS q2 while awake  - Tbitrate O2 for sat > 88%  - Schedule duonebs Q6    - continue prednisone 40mg  Daily for 5 days  - continue rocephin       **Tobacco use  - smoking cessation counseling  - nicotine    **Pneumonia  - LLL pneumonia on abdominal file yesterday    GI  Weight: 71.4 kg (157 lb 8 oz)   Body mass index is 26.21 kg/m??.    GI prophylaxis: not indicated  The patient's last recorded bowel movement: PTA  Bowel regimen: colace  Current nutritional source: NPO    **Nutrition  - NPO secondary to severe abdominal pain    **Nausea  - zofran prn    Most recent LFTs:  Lab Results   Component Value Date    AST 33 01/02/2018    ALT 29 01/02/2018    ALKPHOS 106 01/02/2018    BILITOT 0.7 01/02/2018    PROT 7.7 01/02/2018    ALBUMIN 4.5 01/02/2018 Renal/Endocrine    Intake/Output Summary (Last 24 hours) at 07/12/2018 1039  Last data filed at 07/12/2018 1000  Gross per 24 hour   Intake 2700 ml   Output 2515 ml   Net 185 ml     The patient current insulin regimen for blood glucose control:  SSI  Foley catheter: Foley required for accurate continuous I/O's.       **Glycemic control  - discontinue SSI as not using      **Fluids  - goal euvolemia, NS at 75 cc/hour while NPO   - foley required for strict ins/outs in the setting of active hemorrhage and hypovolemia    **Hypokalemia   - replete K    **Hypomagnesemia  - replete Mg    **Hypocalcemia  - check ionized calcium  - replete Ca    Recent Labs   Lab Units 07/12/18  0339 07/11/18  0122 07/10/18  0313 07/09/18  1402   CREATININE mg/dL 1.61* 0.96* 0.45 4.09*     No results found for: A1C  Potassium   Date Value Ref Range Status   07/12/2018 3.6 3.5 - 5.0 mmol/L Final     Magnesium   Date Value Ref Range Status   07/12/2018 1.7 1.6 - 2.2 mg/dL Final     Phosphorus   Date Value Ref Range Status   07/12/2018 3.0 2.9 - 4.7 mg/dL Final           ID  Temp (24hrs), Avg:37.3 ??C (99.2 ??F), Min:36.8 ??C (98.2 ??F), Max:38.2 ??C (100.8 ??F)    Recent Labs   Lab Units 07/12/18  0339 07/11/18  0941 07/11/18  0122 07/10/18  0313   WBC 10*9/L 8.4 6.8 7.4 7.6       - no current s/s of infection  - culture if temp > 38.5      Current Access:         -  PIV x 2       - Arterial line: Arterial line required for BP monitoring.       - Central venous line: No central line present.  Current antibiotics:  ??? acyclovir (ZOVIRAX) tablet 400 mg Oral Nightly   ??? cefepime (MAXIPIME) 1 g in sodium  chloride 0.9 % (NS) 100 mL IVPB-connector bag Intravenous Q6H     Cultures:  No results found for: BLOOD CULTURE, URINE CULTURE, LOWER RESPIRATORY CULTURE  WBC (10*9/L)   Date Value   07/12/2018 8.4        Last CSF Analysis: No results found for: COLORCSF, APPEARCSF, NUCCELLSCSF, RBCCSF, NCSF, LYMPHSCSF, MONOMACCSF, EOSCSF, OTHERCSF, SPUNA, PROTCSF, GLUCCSF    Tubes and drains:  Patient Lines/Drains/Airways Status    Active Active Lines, Drains, & Airways     Name:   Placement date:   Placement time:   Site:   Days:    Urethral Catheter Non-latex;Temperature probe 16 Fr.   07/09/18    1055    Non-latex;Temperature probe   2    Peripheral IV 12/28/17 Right Antecubital   12/28/17    0930    Antecubital   196    Peripheral IV 07/09/18 Left Hand   07/09/18    1320    Hand   2    Peripheral IV 07/10/18 Right Forearm   07/10/18    0525    Forearm   2    Arterial Line 07/09/18 Left Radial   07/09/18    1327    Radial   2                         Objective Data        Temp:  [36.8 ??C (98.2 ??F)-38.2 ??C (100.8 ??F)] 37.2 ??C (99 ??F)  Heart Rate:  [91-130] 109  SpO2 Pulse:  [92-131] 125  Resp:  [17-27] 24  BP: (100-153)/(38-80) 100/39  MAP (mmHg):  [57-91] 57  A BP-2: (103-178)/(51-84) 147/63  MAP:  [0 mmHg-109 mmHg] 86 mmHg  FiO2 (%):  [50 %-60 %] 60 %  SpO2:  [89 %-97 %] 91 %    Intake/Output Summary (Last 24 hours) at 07/12/2018 1039  Last data filed at 07/12/2018 1000  Gross per 24 hour   Intake 2700 ml   Output 2515 ml   Net 185 ml     SpO2: 91 %  Height: 165.1 cm (5' 5)   Weight: 71.4 kg (157 lb 8 oz)   Body mass index is 26.21 kg/m??.         Hemodynamic/Invasive Device Data (24 hrs):  A BP-2: (103-178)/(51-84) 147/63  MAP:  [0 mmHg-109 mmHg] 86 mmHg    Ventilation/Oxygen Therapy (24hrs):  FiO2 (%):  [50 %-60 %] 60 %  O2 Device: HFNC  O2 Flow Rate (L/min):  [4 L/min-40 L/min] 40 L/min        Medications:  Scheduled medications:   ??? acyclovir  400 mg Oral Nightly   ??? albuterol  2.5 mg Nebulization Q6H (RT)   ??? atorvastatin  20 mg Oral Nightly   ??? carBAMazepine  200 mg Oral BID   ??? Cefepime  1 g Intravenous Q6H   ??? cholecalciferol (vitamin D3)  1,000 Units Oral Daily   ??? dimethyl fumarate  240 mg Oral BID   ??? docusate sodium  100 mg Oral BID   ??? escitalopram oxalate  10 mg Oral Daily   ??? fluticasone propion-salmeterol  1 puff Inhalation BID   ??? ipratropium  500 mcg Nebulization Q6H (RT)   ??? nicotine  1 patch Transdermal Daily   ??? predniSONE  40 mg Oral Daily     Continuous infusions:   ??? sodium chloride 75 mL/hr (07/12/18 0600)     PRN medications:  acetaminophen, acetaminophen, baclofen, hydrALAZINE, iohexol, magnesium sulfate in water, MORPhine injection, ondansetron, oxyCODONE, potassium chloride in water                   Physical Exam     All vital signs for the past 24 hours have been reviewed.  All laboratory studies resulted in the past 24 hours have been reviewed.     General Exam:  General: Awake and alert.   Appears comfortable. No obvious distress.   ENT:  Mucous membranes moist. Oropharynx clear.  Cardiovascular:  Regular rate and rhythm.  No murmurs.   2+ radial pulses bilaterally.    Respiratory: Breathing is comfortable and unlabored.  Lungs are clear to ausculation bilaterally.    Gastrointestinal: soft diffusely tender throughout  Extremities: Warm and well-perfused. No cyanosis, clubbing, or edema.  Skin: No obvious rashes or ecchymoses.    Neurological Exam:  Mental Status  LOC: awake and alert  Orientation: person, place and time  Neglect: not present  Speech: normal  Language: fluent with intact naming, repetition, comprehension    Cranial Nerves  Pupils: PERRL  Corneals: present bilaterally  Gaze: normal  Face Sensory: intact and symmetric  Face Motor: normal and symmetric  Cough: strong  Tongue: midline    Motor:   RUE: commands and 5/5  LUE: commands and 5/5  RLE: commands and 5/5  LLE: commands and 5/5     Sensory:    Sensory is intact to light touch    Glasgow Coma Score  Motor: obeys commands = 6  Verbal: oriented = 5  Eyes: eyes open spontaneously = 4        DISPOSITION     The patient requires admission to the [facility] for management of the above conditions.    Estimated Transfer Date:  07/13/18        BILLING     This patient is critically ill or injured with the impairment of vital organ systems such that there is a high probability of imminent or life threatening deterioration in the patient's condition. The reason the patient is critically ill and the nature of the treatment and management provided to manage the critically ill patient as above.  ??  I directly provided 33 minutes of critical care time as documented in this note. Time includes: direct patient care, patient reassessment, coordination of patient care, interpretation of data, review of patient medical records, and documentation of patient care. This time is exclusive of separately billable procedures.        Caro Hight, MD  Neurocritical Care Attending Physician  07/12/18 7:55 AM

## 2018-07-12 NOTE — Unmapped (Signed)
NEUROSURGERY PROGRESS NOTE    Brief History of Present Illness  Jennifer Lewis is a 57 y.o. female with history of left ophthalmic aneurysm s/p pipeline embolization (04/25/2017) now s/p coil embolization of right cavernous ICA aneurysm (07/09/18).    Subjective/Interval History  Increasing oxygen requirement. Hemoglobin stable. Slightly worse exam    Interval Imaging Reviewed  None    Neurological Assessment and Plan  **Left ophthalmic aneurysm and right cavernous ICA aneurysm  PED 3 s/p pipeline embolization  - Na>135  - SBP<180  - Holding ASA in setting of retroperitoneal bleed  - Serial hemoglobins per vascular surgery  - Tachycardic with continued O2 requirement. Will continue aggressive COPD exacerbation care    Disposition: NSICU.    This patient is co-managed with the [facility]. The management of this patient was discussed in detail with them and we agree with their overall assessment and plan.    ___________________________________________________________________    Neurological Exam  (exam with sat's in 80s)  EOSp  Ox3  PERRL  EOMI  F=  TML  4/5 x 4  BL PND    The patient's vitals, intake/output, labs, orders, and relevant imaging were reviewed for the last 24 hours.    Problem List  Active Problems:    * No active hospital problems. *  Resolved Problems:    * No resolved hospital problems. *

## 2018-07-12 NOTE — Unmapped (Signed)
Problem: Breathing Pattern Ineffective  Goal: Effective Breathing Pattern  Outcome: Progressing  Intervention: Promote Improved Breathing Pattern  Flowsheets  Taken 07/12/2018 0200 by Anders Grant, RN  Head of Bed [facility]): HOB at 30-45 degrees  Taken 07/12/2018 0251 by Fransisca Connors, RRT  Supportive Measures: active listening utilized;self-care encouraged;verbalization of feelings encouraged  Airway/Ventilation Management: airway patency maintained;humidification applied;pulmonary hygiene promoted  Breathing Techniques/Airway Clearance: deep/controlled cough encouraged  Note:   Patient was compliant with all inhaled scheduled medications with no adverse reactions noted.  Vital signs are currently stable with no visible sign of respiratory distress noted.

## 2018-07-13 DIAGNOSIS — I671 Cerebral aneurysm, nonruptured: Principal | ICD-10-CM

## 2018-07-13 LAB — BASIC METABOLIC PANEL
ANION GAP: 3 mmol/L — ABNORMAL LOW (ref 9–15)
BLOOD UREA NITROGEN: 9 mg/dL (ref 7–21)
BUN / CREAT RATIO: 20
CALCIUM: 8.1 mg/dL — ABNORMAL LOW (ref 8.5–10.2)
CO2: 29 mmol/L (ref 22.0–30.0)
CREATININE: 0.45 mg/dL — ABNORMAL LOW (ref 0.60–1.00)
EGFR CKD-EPI AA FEMALE: >90 mL/min/{1.73_m2} (ref >=60)
EGFR CKD-EPI NON-AA FEMALE: >90 mL/min/{1.73_m2} (ref >=60)
GLUCOSE RANDOM: 90 mg/dL (ref 65–179)
SODIUM: 138 mmol/L (ref 135–145)

## 2018-07-13 LAB — CBC
HEMATOCRIT: 25.2 % — ABNORMAL LOW (ref 36.0–46.0)
HEMOGLOBIN: 8.4 g/dL — ABNORMAL LOW (ref 12.0–16.0)
MEAN CORPUSCULAR HEMOGLOBIN CONC: 33.3 g/dL (ref 31.0–37.0)
MEAN CORPUSCULAR HEMOGLOBIN: 31.8 pg (ref 26.0–34.0)
MEAN CORPUSCULAR VOLUME: 95.6 fL (ref 80.0–100.0)
MEAN PLATELET VOLUME: 8.1 fL (ref 7.0–10.0)
PLATELET COUNT: 165 10*9/L (ref 150–440)
RED BLOOD CELL COUNT: 2.63 10*12/L — ABNORMAL LOW (ref 4.00–5.20)
WBC ADJUSTED: 5.8 10*9/L (ref 4.5–11.0)

## 2018-07-13 LAB — HEMATOCRIT: Lab: 25.2 — ABNORMAL LOW

## 2018-07-13 LAB — PHOSPHORUS
Phosphate:MCnc:Pt:Ser/Plas:Qn:: 1.9 — ABNORMAL LOW
Phosphate:MCnc:Pt:Ser/Plas:Qn:: 3.1

## 2018-07-13 LAB — MAGNESIUM: Magnesium:MCnc:Pt:Ser/Plas:Qn:: 2

## 2018-07-13 LAB — ANION GAP: Anion gap 3:SCnc:Pt:Ser/Plas:Qn:: 3 — ABNORMAL LOW

## 2018-07-13 NOTE — Unmapped (Signed)
Neuro: Follow commands, move all extremities. SR -ST . Lungs diminished on HFNC @ 60%  Fio2 to keep sat > 88 %. CTA chest shows pneumonia vanc and Cefepime started , unable to collect  sputum specimen. Abdomen soft, round last bowel movement  prior to admission. Foley cath in place , 40 mg of lasix administer. Upper chest petechia to chest area and bruise to right arm and left . Remain stable, no sign of distress   Problem: Adult Inpatient Plan of Care  Goal: Plan of Care Review  Outcome: Progressing  Goal: Patient-Specific Goal (Individualization)  Outcome: Progressing  Goal: Absence of Hospital-Acquired Illness or Injury  Outcome: Progressing  Goal: Optimal Comfort and Wellbeing  Outcome: Progressing  Goal: Readiness for Transition of Care  Outcome: Progressing  Goal: Rounds/Family Conference  Outcome: Progressing     Problem: Skin Injury Risk Increased  Goal: Skin Health and Integrity  Outcome: Progressing  Intervention: Optimize Skin Protection  Flowsheets (Taken 07/12/2018 1700)  Head of Bed (HOB): HOB at 30 degrees  Pressure Reduction Devices: elbow protectors utilized     Problem: Pain Chronic (Persistent)  Goal: Acceptable Pain Control and Functional Ability  Outcome: Progressing     Problem: Fall Injury Risk  Goal: Absence of Fall and Fall-Related Injury  Outcome: Progressing     Problem: Chronic Pain (Multiple Sclerosis)  Goal: Acceptable Pain Control  Outcome: Progressing  Intervention: Develop Pain Management Plan  Flowsheets (Taken 07/12/2018 1700)  Pain Management Interventions: position adjusted     Problem: Fatigue (Multiple Sclerosis)  Goal: Optimal Energy for Daily Activity  Outcome: Progressing     Problem: Mobility Impairment (Multiple Sclerosis)  Goal: Safety with Mobility  Outcome: Progressing     Problem: Self-Care Deficit (Multiple Sclerosis)  Goal: Ability to Complete Activities of Daily Living  Outcome: Progressing     Problem: COPD Comorbidity  Goal: Maintenance of COPD Symptom Control Outcome: Progressing     Problem: Hypertension Comorbidity  Goal: Blood Pressure in Desired Range  Outcome: Progressing     Problem: Pain Chronic (Persistent) (Comorbidity Management)  Goal: Acceptable Pain Control and Functional Ability  Outcome: Progressing  Intervention: Develop Pain Management Plan  Flowsheets (Taken 07/12/2018 1700)  Pain Management Interventions: position adjusted     Problem: Breathing Pattern Ineffective  Goal: Effective Breathing Pattern  Outcome: Progressing  Intervention: Promote Improved Breathing Pattern  Flowsheets (Taken 07/12/2018 1700)  Head of Bed (HOB): HOB at 30 degrees

## 2018-07-13 NOTE — Unmapped (Addendum)
NEUROSURGERY PROGRESS NOTE    Brief History of Present Illness  Jennifer Lewis is a 57 y.o. female with history of left ophthalmic aneurysm s/p pipeline embolization (04/25/2017) now s/p coil embolization of right cavernous ICA aneurysm (07/09/18).    Subjective/Interval History  Exam stable. Continues to require high flow oxygen, working on weaning off O2.    Interval Imaging Reviewed  No new neuro-imaging    Neurological Assessment and Plan  **Left ophthalmic aneurysm and right cavernous ICA aneurysm  PED 4 s/p coil embolization of right cavernous ICA aneurysm (07/09/18)  - Na>135  - SBP<180  - Holding ASA in setting of retroperitoneal bleed  - Serial hemoglobins per vascular surgery  - Tachycardic with continued O2 requirement. Will continue aggressive COPD exacerbation care    Disposition: NSICU.    This patient is co-managed with the [facility]. The management of this patient was discussed in detail with them and we agree with their overall assessment and plan.    ___________________________________________________________________    Neurological Exam  EOSp  Ox3  PERRL  EOMI  F=  TML  BUE 5  BLE 5  No PND    The patient's vitals, intake/output, labs, orders, and relevant imaging were reviewed for the last 24 hours.    Problem List  Active Problems:    * No active hospital problems. *  Resolved Problems:    * No resolved hospital problems. *

## 2018-07-13 NOTE — Unmapped (Signed)
Spiritual Care Consult Note    Clinical Encounter Type  Care Provided To: Patient and family together  Referral Source: Physician  Type of Visit: Initial visit  On-Call Visit?: Yes  Reason for Visit: Routine spiritual support  Presenting Concern(s): Religious specific need, Despair, Isolation    Spiritual Care Profile  Faith: [employer]  Emotions: despair, frustration, and concern  Community: Family, Significant other    Spiritual Assessment  Interventions Made: Established relationship of care and support, Compassionate presence, Reflective listening, Explored feelings, Named/reviewed values, beliefs, practices, Supported patient's sources of spiritual strength, Prayer, Use of storytelling               Supported patient's sources of spiritual strength: yes   Explored feelings: despair, frustration   Named/reviewed values, beliefs, practices: prayer, church, and God's blessings          Needs: prayer and spiritual/emotional support  Hopes: to heal and for her son to be okay  Resources: faith, family, and God    Assessment Summary: I introduced myself and my role to the pt and pt's husband. The pt shared concerns that were weighing heavy on her heart. We explored those concerns and the emotions surrounding them. I supported the pt's faith and offered prayer. She appreciated the visit and is open to continued care.     Outcomes  Outcomes: Open to continued care, Appreciated Chaplain visit            Spiritual Care Plan  Spiritual Care Plan: Continued care, focusing on(exploring emotions and sources of support. )     Lorenza Evangelist Intern  Pager: [phone]    Signed: Reesa Chew, Chaplain 1:27 PM 07/13/2018

## 2018-07-13 NOTE — Unmapped (Signed)
Neuro: Alert, oriented x4, follows commands, full strength.  Cv: NSR-St, SBP maintained less than 160 with PRN hydralazine  Resp: HFNC, lung sounds diminished  Gi: Last BM PTA, bowel sounds present, clear liquid diet  Gu: Foley removed per order.  Family at bedside. Will continue to monitor.       Problem: Adult Inpatient Plan of Care  Goal: Plan of Care Review  Outcome: Progressing  Goal: Patient-Specific Goal (Individualization)  Outcome: Progressing  Goal: Absence of Hospital-Acquired Illness or Injury  Outcome: Progressing  Goal: Optimal Comfort and Wellbeing  Outcome: Progressing  Goal: Readiness for Transition of Care  Outcome: Progressing  Goal: Rounds/Family Conference  Outcome: Progressing     Problem: Skin Injury Risk Increased  Goal: Skin Health and Integrity  Outcome: Progressing     Problem: Pain Chronic (Persistent)  Goal: Acceptable Pain Control and Functional Ability  Outcome: Progressing     Problem: Fall Injury Risk  Goal: Absence of Fall and Fall-Related Injury  Outcome: Progressing     Problem: Chronic Pain (Multiple Sclerosis)  Goal: Acceptable Pain Control  Outcome: Progressing     Problem: Fatigue (Multiple Sclerosis)  Goal: Optimal Energy for Daily Activity  Outcome: Progressing     Problem: Mobility Impairment (Multiple Sclerosis)  Goal: Safety with Mobility  Outcome: Progressing     Problem: Self-Care Deficit (Multiple Sclerosis)  Goal: Ability to Complete Activities of Daily Living  Outcome: Progressing     Problem: COPD Comorbidity  Goal: Maintenance of COPD Symptom Control  Outcome: Progressing     Problem: Hypertension Comorbidity  Goal: Blood Pressure in Desired Range  Outcome: Progressing     Problem: Pain Chronic (Persistent) (Comorbidity Management)  Goal: Acceptable Pain Control and Functional Ability  Outcome: Progressing

## 2018-07-13 NOTE — Unmapped (Signed)
PT Follow commands *4, ANO*4.  No drifts BUE/BLE. NSR -ST . Lungs diminished on HFNC @ 55-60%  Fio2 to keep sat > 88 %.  Abdomen soft, round last bowel movement  prior to admission. Foley cath in place.  Pain managed with PRN's.      Problem: Adult Inpatient Plan of Care  Goal: Plan of Care Review  Outcome: Progressing  Goal: Patient-Specific Goal (Individualization)  Outcome: Progressing  Goal: Absence of Hospital-Acquired Illness or Injury  Outcome: Progressing  Goal: Optimal Comfort and Wellbeing  Outcome: Progressing  Goal: Readiness for Transition of Care  Outcome: Progressing  Goal: Rounds/Family Conference  Outcome: Progressing     Problem: Skin Injury Risk Increased  Goal: Skin Health and Integrity  Outcome: Progressing     Problem: Pain Chronic (Persistent)  Goal: Acceptable Pain Control and Functional Ability  Outcome: Progressing     Problem: Fall Injury Risk  Goal: Absence of Fall and Fall-Related Injury  Outcome: Progressing     Problem: Chronic Pain (Multiple Sclerosis)  Goal: Acceptable Pain Control  Outcome: Progressing     Problem: Fatigue (Multiple Sclerosis)  Goal: Optimal Energy for Daily Activity  Outcome: Progressing     Problem: Self-Care Deficit (Multiple Sclerosis)  Goal: Ability to Complete Activities of Daily Living  Outcome: Progressing     Problem: COPD Comorbidity  Goal: Maintenance of COPD Symptom Control  Outcome: Progressing     Problem: Hypertension Comorbidity  Goal: Blood Pressure in Desired Range  Outcome: Progressing     Problem: Pain Chronic (Persistent) (Comorbidity Management)  Goal: Acceptable Pain Control and Functional Ability  Outcome: Progressing     Problem: Breathing Pattern Ineffective  Goal: Effective Breathing Pattern  Outcome: Progressing     Problem: Mobility Impairment (Multiple Sclerosis)  Goal: Safety with Mobility  Outcome: Not Progressing

## 2018-07-13 NOTE — Unmapped (Signed)
Neurocritical Care   Progress Note     CODE STATUS:  FULL    Date of service: 07/13/2018  Hospital Day:  LOS: 4 days             Assessment and Plan     Jennifer Lewis is a 57 y.o. 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 The patient is admitted to NSICU following coil embolization of right ICA aneurysm.    24 Hour Events: She continues to have the same abdominal pain that is poorly controlled    Neuro:  Daily interruption of sedation: N/A  Multi-Modality Monitoring: none  Current anticonvulsants: not indicated.  Mobilty protocol current stage: 3  Splints/PRAFO:  No  Restraints:  Not indicated  CAM-ICU:  Negative    **Right ICA aneurysm s/p coil embolization  - clinically with intact neurologic exam  - holding home ASA in the setting of retroperitoneal hematoma  - SBP goal <160  - Sodium goal >135, checking daily  - PT/OT consults when stable, defer today   - patient remains at risk for neurological decline due to post-op hemorrhage, cerebral edema, and seizures and requires close monitoring in the NSICU     **Depression  - Continue home Lexapro    **MS  - Continue home Tecfidera and PRN Baclofen  - Continue Tegretol for paryoxysmal sensory MS symptoms     **Pain/sedation  - tylenol prn mild pain  - oxycodone prn moderate pain  - morphine prn severe pain   - Schedule tylenol  - appreciate chronic pain assistance    Recent Labs   Lab Units 07/13/18  0243   SODIUM mmol/L 138         Cardiovascular:  Admission weight: 72  Daily Weight: 79.1 kg (174 lb 6.1 oz)   A BP-2: (130-177)/(54-118) 163/117  MAP:  [79 mmHg-138 mmHg] 133 mmHg  Cardiac enzymes: No results in the last day      **Hemodynamic goals  - Current vasoactive medications:  None  - MAP > 65  - goal SBP < 160   - Troponin unremarkable, EKG normal sinus rhythm    **Hyperlipidemia  - Continue atorvastatin 20 mg daily (home med)      Heme  Current DVT prophylaxis: SCDs, holding lovenox in the setting of active bleeding    **Right Retroperitoneal Hematoma  - in the setting of recent groin puncture for cerebral angiogram  - PVL groin pseudoaneurysm protocol found no vascular abnormality  - CTA abd/pelvis confirmed retroperitoneal and peritoneal hemorrhage of the right hemipelvis  - Continue to hold Lovenox        Recent Labs   Lab Units 07/13/18  0243 07/12/18  0339 07/11/18  1605 07/11/18  0941 07/11/18  0122   HEMOGLOBIN g/dL 8.4* 9.2* 9.1* 9.3* 9.9*   PLATELET COUNT (1) 10*9/L 165 171  --  153 175     Lab Results   Component Value Date    INR 0.98 07/11/2018    APTT 30.8 07/11/2018       Pulmonary  The HOB is elevated to greater than 30 degrees.  O2/Vent Settings:   FiO2 (%):  [40 %-60 %] 60 %  O2 Device: HFNC  O2 Flow Rate (L/min):  [30 L/min-60 L/min] 35 L/min    CXR: (personally reviewed) 7/18 - demonstrated opacity of the left costophrenic angle suspected to represent atelectasis vs.  infection    **COPD Exacerbation  - ABG completed with PO2 61, no hypercarbia  - HFNC 35L 60% O2  - IS q2 while awake  - Tbitrate O2 for sat > 88%  - Schedule duonebs Q6    - continue prednisone 40mg  Daily for 5 days  - continue rocephin       **Tobacco use  - smoking cessation counseling  - nicotine    **Pneumonia  - LLL pneumonia on abdominal CT    GI  Weight: 79.1 kg (174 lb 6.1 oz)   Body mass index is 29.02 kg/m??.    GI prophylaxis: not indicated  The patient's last recorded bowel movement: PTA  Bowel regimen: colace, miralax daily  Current nutritional source: NPO    **Nutrition  - NPO secondary to severe abdominal pain    **Nausea  - zofran prn    Most recent LFTs:  Lab Results   Component Value Date    AST 33 01/02/2018    ALT 29 01/02/2018    ALKPHOS 106 01/02/2018    BILITOT 0.7 01/02/2018    PROT 7.7 01/02/2018    ALBUMIN 4.5 01/02/2018           Renal/Endocrine    Intake/Output Summary (Last 24 hours) at 07/13/2018 1010  Last data filed at 07/13/2018 0400  Gross per 24 hour   Intake 2954.17 ml   Output 1395 ml   Net 1559.17 ml     The patient current insulin regimen for blood glucose control:  SSI  Foley catheter: Foley to be removed today.       **Glycemic control  - discontinue SSI as not using      **Fluids  - medlock IVs  - foley required for strict ins/outs in the setting of active hemorrhage and hypovolemia    **Hypokalemia   - replete K    **Hypomagnesemia  - replete Mg    **Hypophosphatemia  - replete Phos      Recent Labs   Lab Units 07/13/18  0243 07/12/18  0339 07/11/18  0122 07/10/18  0313   CREATININE mg/dL 1.47* 8.29* 5.62* 1.30     No results found for: A1C  Potassium   Date Value Ref Range Status   07/13/2018 3.4 (L) 3.5 - 5.0 mmol/L Final     Magnesium   Date Value Ref Range Status   07/13/2018 2.0 1.6 - 2.2 mg/dL Final     Phosphorus   Date Value Ref Range Status   07/13/2018 1.9 (L) 2.9 - 4.7 mg/dL Final         ID  Temp (24hrs), Avg:37.1 ??C (98.7 ??F), Min:36.9 ??C (98.4 ??F), Max:37.2 ??C (98.9 ??F)    Recent Labs   Lab Units 07/13/18  0243 07/12/18  0339 07/11/18  0941 07/11/18  0122   WBC 10*9/L 5.8 8.4 6.8 7.4       HCAP  - LLL Pneumonia  - Vancomycin and Cefepime for 7 days  - follow up cultures to narrow abx  - culture if temp > 38.5      Current Access:         -  PIV x 2       - Arterial line: Arterial line required for BP monitoring.       - Central venous line: No central line present.     Current antibiotics:  ??? acyclovir (ZOVIRAX) tablet 400 mg Oral Nightly   ??? cefepime (MAXIPIME) 1 g in sodium chloride 0.9 % (  NS) 100 mL IVPB-connector bag Intravenous Q6H   ??? vancomycin (VANCOCIN) 1,250 mg in sodium chloride (NS) 0.9 % 250 mL IVPB Intravenous Q12H     Cultures:  Blood Culture, Routine (no units)   Date Value   07/12/2018 No Growth at 24 hours     Lower Respiratory Culture (no units)   Date Value   07/12/2018 Specimen Not Processed     WBC (10*9/L)   Date Value   07/13/2018 5.8        Last CSF Analysis: No results found for: COLORCSF, APPEARCSF, NUCCELLSCSF, RBCCSF, NCSF, LYMPHSCSF, MONOMACCSF, EOSCSF, OTHERCSF, SPUNA, PROTCSF, GLUCCSF    Tubes and drains:  Patient Lines/Drains/Airways Status    Active Active Lines, Drains, & Airways     Name:   Placement date:   Placement time:   Site:   Days:    Urethral Catheter Non-latex;Temperature probe 16 Fr.   07/09/18    1055    Non-latex;Temperature probe   3    Peripheral IV 12/28/17 Right Antecubital   12/28/17    0930    Antecubital   196    Peripheral IV 07/12/18 Right Antecubital   07/12/18    1049    Antecubital   less than 1    Peripheral IV 07/12/18 Right Hand   07/12/18    1020    Hand   less than 1    Arterial Line 07/09/18 Left Radial   07/09/18    1327    Radial   3                         Objective Data        Temp:  [36.9 ??C (98.4 ??F)-37.2 ??C (98.9 ??F)] 37.1 ??C (98.8 ??F)  Heart Rate:  [101-127] 114  SpO2 Pulse:  [102-127] 113  Resp:  [11-35] 11  BP: (137-174)/(49-79) 137/69  MAP (mmHg):  [80-109] 87  A BP-2: (130-177)/(54-118) 163/117  MAP:  [79 mmHg-138 mmHg] 133 mmHg  FiO2 (%):  [40 %-60 %] 60 %  SpO2:  [88 %-99 %] 91 %    Intake/Output Summary (Last 24 hours) at 07/13/2018 1010  Last data filed at 07/13/2018 0400  Gross per 24 hour   Intake 2954.17 ml   Output 1395 ml   Net 1559.17 ml     SpO2: 91 %  Height: 165.1 cm (5' 5)   Weight: 79.1 kg (174 lb 6.1 oz)   Body mass index is 29.02 kg/m??.         Hemodynamic/Invasive Device Data (24 hrs):  A BP-2: (130-177)/(54-118) 163/117  MAP:  [79 mmHg-138 mmHg] 133 mmHg    Ventilation/Oxygen Therapy (24hrs):  FiO2 (%):  [40 %-60 %] 60 %  O2 Device: HFNC  O2 Flow Rate (L/min):  [30 L/min-60 L/min] 35 L/min        Medications:  Scheduled medications:   ??? acyclovir  400 mg Oral Nightly   ??? albuterol  2.5 mg Nebulization Q6H (RT)   ??? atorvastatin  20 mg Oral Nightly   ??? carBAMazepine  200 mg Oral BID   ??? Cefepime  1 g Intravenous Q6H   ??? cholecalciferol (vitamin D3)  1,000 Units Oral Daily   ??? dimethyl fumarate  240 mg Oral BID   ??? docusate sodium  100 mg Oral BID   ??? escitalopram oxalate  10 mg Oral Daily   ??? fluticasone propion-salmeterol  1 puff Inhalation BID   ??? ipratropium  500 mcg Nebulization Q6H (RT)   ??? nicotine  1 patch Transdermal Daily   ??? predniSONE  40 mg Oral Daily   ??? vancomycin  1,250 mg Intravenous Q12H     Continuous infusions:   ??? sodium chloride 75 mL/hr (07/13/18 0400)     PRN medications:   acetaminophen, acetaminophen, baclofen, hydrALAZINE, magnesium sulfate in water, MORPhine injection, ondansetron, oxyCODONE, potassium chloride in water                   Physical Exam     All vital signs for the past 24 hours have been reviewed.  All laboratory studies resulted in the past 24 hours have been reviewed.     General Exam:  General: Awake and alert.   Appears comfortable. No obvious distress.   ENT:  Mucous membranes moist. Oropharynx clear.  Cardiovascular:  Regular rate and rhythm.  No murmurs.   2+ radial pulses bilaterally.    Respiratory: Breathing is comfortable and unlabored.  Lungs are clear to ausculation bilaterally.    Gastrointestinal: soft diffusely tender throughout  Extremities: Warm and well-perfused. No cyanosis, clubbing, or edema.  Skin: No obvious rashes or ecchymoses.    Neurological Exam:  Mental Status  LOC: awake and alert  Orientation: person, place and time  Neglect: not present  Speech: normal  Language: fluent with intact naming, repetition, comprehension    Cranial Nerves  Pupils: PERRL  Corneals: present bilaterally  Gaze: normal  Face Sensory: intact and symmetric  Face Motor: normal and symmetric  Cough: strong  Tongue: midline    Motor:   RUE: commands and 5/5  LUE: commands and 5/5  RLE: commands and 5/5  LLE: commands and 5/5     Sensory:    Sensory is intact to light touch    Glasgow Coma Score  Motor: obeys commands = 6  Verbal: oriented = 5  Eyes: eyes open spontaneously = 4        DISPOSITION     The patient requires admission to the [facility] for management of the above conditions.    Estimated Transfer Date:  07/13/18        BILLING     This patient is critically ill or injured with the impairment of vital organ systems such that there is a high probability of imminent or life threatening deterioration in the patient's condition. The reason the patient is critically ill and the nature of the treatment and management provided to manage the critically ill patient as above.  ??  I directly provided 33 minutes of critical care time as documented in this note. Time includes: direct patient care, patient reassessment, coordination of patient care, interpretation of data, review of patient medical records, and documentation of patient care. This time is exclusive of separately billable procedures.      Caro Hight, MD  Neurocritical Care Attending Physician  07/13/18 7:55 AM

## 2018-07-13 NOTE — Unmapped (Signed)
Problem: Breathing Pattern Ineffective  Goal: Effective Breathing Pattern  Outcome: Progressing  Intervention: Promote Improved Breathing Pattern  Flowsheets  Taken 07/12/2018 0251 by Fransisca Connors, RRT  Supportive Measures: active listening utilized;self-care encouraged;verbalization of feelings encouraged  Airway/Ventilation Management: airway patency maintained;humidification applied;pulmonary hygiene promoted  Breathing Techniques/Airway Clearance: deep/controlled cough encouraged  Taken 07/13/2018 0400 by Ricky Stabs, RN  Head of Bed [facility]): HOB at 20-30 degrees  Note:   Patient was compliant with all inhaled scheduled medications and airway clearance via acapella.  Sputum specimen sent overnight.  Vital signs are currently stable on 40 LPM HFNC (50%) with no visible sign of respiratory distress noted.

## 2018-07-14 LAB — PHOSPHORUS: Phosphate:MCnc:Pt:Ser/Plas:Qn:: 2 — ABNORMAL LOW

## 2018-07-14 LAB — CBC
HEMATOCRIT: 26 % — ABNORMAL LOW (ref 36.0–46.0)
HEMOGLOBIN: 8.5 g/dL — ABNORMAL LOW (ref 12.0–16.0)
MEAN CORPUSCULAR HEMOGLOBIN CONC: 32.5 g/dL (ref 31.0–37.0)
MEAN CORPUSCULAR HEMOGLOBIN: 31.5 pg (ref 26.0–34.0)
MEAN PLATELET VOLUME: 8.4 fL (ref 7.0–10.0)
PLATELET COUNT: 203 10*9/L (ref 150–440)
RED BLOOD CELL COUNT: 2.68 10*12/L — ABNORMAL LOW (ref 4.00–5.20)
WBC ADJUSTED: 5.1 10*9/L (ref 4.5–11.0)

## 2018-07-14 LAB — BASIC METABOLIC PANEL
BLOOD UREA NITROGEN: 9 mg/dL (ref 7–21)
BUN / CREAT RATIO: 21
CALCIUM: 8.7 mg/dL (ref 8.5–10.2)
CHLORIDE: 107 mmol/L (ref 98–107)
CO2: 30 mmol/L (ref 22.0–30.0)
EGFR CKD-EPI AA FEMALE: >90 mL/min/{1.73_m2} (ref >=60)
EGFR CKD-EPI NON-AA FEMALE: >90 mL/min/{1.73_m2} (ref >=60)
GLUCOSE RANDOM: 87 mg/dL (ref 65–179)
POTASSIUM: 3.4 mmol/L — ABNORMAL LOW (ref 3.5–5.0)
SODIUM: 140 mmol/L (ref 135–145)

## 2018-07-14 LAB — MAGNESIUM: Magnesium:MCnc:Pt:Ser/Plas:Qn:: 1.8

## 2018-07-14 LAB — BUN / CREAT RATIO: Urea nitrogen/Creatinine:MRto:Pt:Ser/Plas:Qn:: 21

## 2018-07-14 LAB — PLATELET COUNT: Lab: 203

## 2018-07-14 NOTE — Unmapped (Signed)
Neuro: Pt alert and oriented x4 and f/c.  + c/g/c.  Pt is moderate strength throughout. Neuro logic exam stable and unremarkable.  CV: Nsr to ST on the monitor. SBP goal 160, MAP > 65.  Resp: HFNC 70% FiO2  Lungs clear throughout.  SpO2>88%.  Heme: SCDs in place bilaterally.  GI: Tender abdomen but soft  +BS x4.  No BM today.  GU/Renal: Voiding in bedpan and bedside commode. Appropriate UOP  Skin: Skin intact.  Q2 turns encouraged pt makes large postion changes themselves.  ID: Afebrile  Pain: pain is  constant from fibromyalgia and MS     Family updated on POC.         Problem: Adult Inpatient Plan of Care  Goal: Plan of Care Review  Outcome: Progressing  Goal: Patient-Specific Goal (Individualization)  Outcome: Progressing  Goal: Absence of Hospital-Acquired Illness or Injury  Outcome: Progressing  Goal: Optimal Comfort and Wellbeing  Outcome: Progressing  Goal: Readiness for Transition of Care  Outcome: Progressing  Goal: Rounds/Family Conference  Outcome: Progressing     Problem: Skin Injury Risk Increased  Goal: Skin Health and Integrity  Outcome: Progressing     Problem: Pain Chronic (Persistent)  Goal: Acceptable Pain Control and Functional Ability  Outcome: Progressing     Problem: Fall Injury Risk  Goal: Absence of Fall and Fall-Related Injury  Outcome: Progressing     Problem: Chronic Pain (Multiple Sclerosis)  Goal: Acceptable Pain Control  Outcome: Progressing     Problem: Fatigue (Multiple Sclerosis)  Goal: Optimal Energy for Daily Activity  Outcome: Progressing     Problem: Mobility Impairment (Multiple Sclerosis)  Goal: Safety with Mobility  Outcome: Progressing     Problem: Self-Care Deficit (Multiple Sclerosis)  Goal: Ability to Complete Activities of Daily Living  Outcome: Progressing     Problem: COPD Comorbidity  Goal: Maintenance of COPD Symptom Control  Outcome: Progressing     Problem: Hypertension Comorbidity  Goal: Blood Pressure in Desired Range  Outcome: Progressing     Problem: Pain Chronic (Persistent) (Comorbidity Management)  Goal: Acceptable Pain Control and Functional Ability  Outcome: Progressing     Problem: Breathing Pattern Ineffective  Goal: Effective Breathing Pattern  Outcome: Progressing

## 2018-07-14 NOTE — Unmapped (Addendum)
NEUROSURGERY PROGRESS NOTE    Brief History of Present Illness  Jennifer Lewis is a 57 y.o. female with history of left ophthalmic aneurysm s/p pipeline embolization (04/25/2017) now s/p coil embolization of right cavernous ICA aneurysm (07/09/18).    Subjective/Interval History  No acute events. Exam stable. Still requiring high flow nasal cannula for oxygenation, working to wean. Still with abdominal pain.    Interval Imaging Reviewed  No new neuro-imaging    Neurological Assessment and Plan  **Left ophthalmic aneurysm and right cavernous ICA aneurysm  PED 5 s/p coil embolization of right cavernous ICA aneurysm (07/09/18)  - Na>135  - SBP<180  - Holding ASA in setting of retroperitoneal bleed  - Serial hemoglobins per vascular surgery  - COPD exacerbation: Continue to wean high flow nasal cannula  - Pneumonia: LLL pneumonia seen on abdominal CT, Vanc/Cefepime for 7 days  - Cultures: blood cx NG x 48 hrs, respiratory cx (7/19): no growth    Disposition: NSICU.    This patient is co-managed with the [facility]. The management of this patient was discussed in detail with them and we agree with their overall assessment and plan.    ___________________________________________________________________    Neurological Exam  AOx3  PERRL  EOMI  F=  TML  5/5 x 4  SILT  No PND    The patient's vitals, intake/output, labs, orders, and relevant imaging were reviewed for the last 24 hours.    Problem List  Active Problems:    * No active hospital problems. *  Resolved Problems:    * No resolved hospital problems. *

## 2018-07-14 NOTE — Unmapped (Signed)
Spiritual Care Consult Note    Clinical Encounter Type  Care Provided To: Patient not available  Referral Source: Physician  Type of Visit: Attempt (Patient unavailable)  On-Call Visit?: Yes  Reason for Visit: Routine spiritual support  Presenting Concern(s): Despair    Spiritual Care Profile  Faith: [employer]  Community: Family, Significant other    Spiritual Assessment  Pt was asleep and unable to visit.        Assessment Summary: Pt was asleep and unable to visit.     Outcomes  Pt was asleep and unable to visit.           Spiritual Care Plan  Spiritual Care Plan: Continued care, focusing on(following up and exploring patients emotions) I will follow-up as I am able.     Elvina Mattes   Chaplain Intern  Pager: [phone]    Signed: Reesa Chew, Chaplain 4:53 AM 07/14/2018

## 2018-07-14 NOTE — Unmapped (Signed)
Pt remains on HFNC at 50%, 35 Liters. FiO2 was weaned from 70 to 50% through this shift to maintain sat goal >88%. She received all scheduled inhaled medications.

## 2018-07-14 NOTE — Unmapped (Signed)
Neurocritical Care   Progress Note     CODE STATUS:  FULL    Date of service: 07/14/2018  Hospital Day:  LOS: 5 days           Assessment and Plan     Jennifer Lewis is a 57 y.o. 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 The patient is admitted to NSICU following coil embolization of right ICA aneurysm.    24 Hour Events: She continues to have the same abdominal pain that is poorly controlled    Neuro:  Daily interruption of sedation: N/A  Multi-Modality Monitoring: none  Current anticonvulsants: not indicated.  Mobilty protocol current stage: 3  Splints/PRAFO:  No  Restraints:  Not indicated  CAM-ICU:  Negative    **Right ICA aneurysm s/p coil embolization  - clinically with intact neurologic exam  - restart ASA today  - SBP goal <160  - Sodium goal >135, checking daily  - PT/OT consults, defer today   - patient remains at risk for neurological decline due to post-op hemorrhage, cerebral edema, and seizures and requires close monitoring in the NSICU     **Depression  - Continue home Lexapro    **MS  - Continue home Tecfidera and PRN Baclofen  - Continue Tegretol for paryoxysmal sensory MS symptoms     **Pain/sedation  - tylenol prn mild pain  - oxycodone prn moderate pain   - dc morphine prn     Recent Labs   Lab Units 07/14/18  0350   SODIUM mmol/L 140       Cardiovascular:  Admission weight: 72  Daily Weight: 79.1 kg (174 lb 6.1 oz)      Cardiac enzymes: No results in the last day      **Hemodynamic goals  - Current vasoactive medications:  None  - MAP > 65  - goal SBP < 160     **Hyperlipidemia  - Continue atorvastatin 20 mg daily (home med)    Heme  Current DVT prophylaxis: SCDs, lovenox 40mg     **Right Retroperitoneal Hematoma  - in the setting of recent groin puncture for cerebral angiogram  - PVL groin pseudoaneurysm protocol found no vascular abnormality  - CTA abd/pelvis confirmed retroperitoneal and peritoneal hemorrhage of the right hemipelvis       Recent Labs   Lab Units 07/14/18  0350 07/13/18  0243 07/12/18  0339 07/11/18  1605 07/11/18  0941   HEMOGLOBIN g/dL 8.5* 8.4* 9.2* 9.1* 9.3*   PLATELET COUNT (1) 10*9/L 203 165 171  --  153     Lab Results   Component Value Date    INR 0.98 07/11/2018    APTT 30.8 07/11/2018       Pulmonary  The HOB is elevated to greater than 30 degrees.  O2/Vent Settings:   FiO2 (%):  [50 %-70 %] 50 %  O2 Device: HFNC  O2 Flow Rate (L/min):  [35 L/min-40 L/min] 35 L/min    CXR: (personally reviewed) 7/18 - demonstrated opacity of the left costophrenic angle suspected to represent atelectasis vs. infection    **COPD Exacerbation  - completing last dose of prednisone 40mg  daily today  - continue scheduled dunebs Q6    **Health Care Aquired Pneumonia  - LLL pneumonia on abdominal CT  - ABG completed with PO2 61, no hypercarbia  - HFNC 30L 50% O2  - IS Q2  while awake  - Titrate O2 for sat > 88%     **Tobacco use  - smoking cessation counseling  - nicotine    GI  Weight: 79.1 kg (174 lb 6.1 oz)   Body mass index is 29.02 kg/m??.    GI prophylaxis: not indicated  The patient's last recorded bowel movement: PTA  Bowel regimen: colace, miralax daily  Current nutritional source: NPO    **Nutrition  - NPO secondary to severe abdominal pain    **Nausea  - zofran prn    Most recent LFTs:  Lab Results   Component Value Date    AST 33 01/02/2018    ALT 29 01/02/2018    ALKPHOS 106 01/02/2018    BILITOT 0.7 01/02/2018    PROT 7.7 01/02/2018    ALBUMIN 4.5 01/02/2018       Renal/Endocrine    Intake/Output Summary (Last 24 hours) at 07/14/2018 0922  Last data filed at 07/14/2018 0713  Gross per 24 hour   Intake 1517.5 ml   Output 855 ml   Net 662.5 ml     The patient current insulin regimen for blood glucose control:  SSI  Foley catheter: Foley to be removed today.       **Glycemic control  - discontinue SSI as not using      **Fluids  - medlock IVs  - foley required for strict ins/outs in the setting of active hemorrhage and hypovolemia    **Hypokalemia   - replete K    **Hypomagnesemia  - replete Mg    **Hypophosphatemia  - replete Phos      Recent Labs   Lab Units 07/14/18  0350 07/13/18  0243 07/12/18  0339 07/11/18  0122   CREATININE mg/dL 0.45* 4.09* 8.11* 9.14*     No results found for: A1C  Potassium   Date Value Ref Range Status   07/14/2018 3.4 (L) 3.5 - 5.0 mmol/L Final     Magnesium   Date Value Ref Range Status   07/14/2018 1.8 1.6 - 2.2 mg/dL Final     Phosphorus   Date Value Ref Range Status   07/14/2018 2.0 (L) 2.9 - 4.7 mg/dL Final         ID  Temp (24hrs), Avg:37.2 ??C (99 ??F), Min:37 ??C (98.6 ??F), Max:37.6 ??C (99.7 ??F)    Recent Labs   Lab Units 07/14/18  0350 07/13/18  0243 07/12/18  0339 07/11/18  0941   WBC 10*9/L 5.1 5.8 8.4 6.8       **Health Care Aquired Pneumonia  - LLL Pneumonia on Abdominal CT  - Cefepime for 7 days  - Completed 3 days of Vancomycin  - cultures with inadequate specimen      Current Access:         -  PIV x 2       - Arterial line: Arterial line required for BP monitoring.       - Central venous line: No central line present.     Current antibiotics:  ??? acyclovir (ZOVIRAX) tablet 400 mg Oral Nightly   ??? cefepime (MAXIPIME) 1 g in sodium chloride 0.9 % (NS) 100 mL IVPB-connector bag Intravenous Q6H   ??? vancomycin (VANCOCIN) 1,250 mg in sodium chloride (NS) 0.9 % 250 mL IVPB Intravenous Q12H     Cultures:  Blood Culture, Routine (no units)   Date Value   07/12/2018 No Growth at 48 hours     Lower Respiratory Culture (no units)   Date  Value   07/12/2018 Specimen Not Processed     WBC (10*9/L)   Date Value   07/14/2018 5.1        Last CSF Analysis: No results found for: COLORCSF, APPEARCSF, NUCCELLSCSF, RBCCSF, NCSF, LYMPHSCSF, MONOMACCSF, EOSCSF, OTHERCSF, SPUNA, PROTCSF, GLUCCSF    Tubes and drains:  Patient Lines/Drains/Airways Status    Active Active Lines, Drains, & Airways     Name:   Placement date: Placement time:   Site:   Days:    Peripheral IV 12/28/17 Right Antecubital   12/28/17    0930    Antecubital   197    Peripheral IV 07/13/18 Left Antecubital   07/13/18    2200    Antecubital   less than 1                         Objective Data        Temp:  [37 ??C (98.6 ??F)-37.6 ??C (99.7 ??F)] 37.1 ??C (98.8 ??F)  Heart Rate:  [86-116] 110  SpO2 Pulse:  [81-113] 94  Resp:  [11-32] 20  BP: (113-169)/(51-98) 169/83  MAP (mmHg):  [73-121] 115  FiO2 (%):  [50 %-70 %] 50 %  SpO2:  [89 %-98 %] 94 %    Intake/Output Summary (Last 24 hours) at 07/14/2018 0922  Last data filed at 07/14/2018 0713  Gross per 24 hour   Intake 1517.5 ml   Output 855 ml   Net 662.5 ml     SpO2: 94 %  Height: 165.1 cm (5' 5)   Weight: 79.1 kg (174 lb 6.1 oz)   Body mass index is 29.02 kg/m??.         Hemodynamic/Invasive Device Data (24 hrs):       Ventilation/Oxygen Therapy (24hrs):  FiO2 (%):  [50 %-70 %] 50 %  O2 Device: HFNC  O2 Flow Rate (L/min):  [35 L/min-40 L/min] 35 L/min        Medications:  Scheduled medications:   ??? acyclovir  400 mg Oral Nightly   ??? albuterol  2.5 mg Nebulization Q6H (RT)   ??? atorvastatin  20 mg Oral Nightly   ??? carBAMazepine  200 mg Oral BID   ??? Cefepime  1 g Intravenous Q6H   ??? cholecalciferol (vitamin D3)  1,000 Units Oral Daily   ??? dimethyl fumarate  240 mg Oral BID   ??? docusate sodium  100 mg Oral BID   ??? enoxaparin (LOVENOX) injection  40 mg Subcutaneous Q24H [facility]   ??? escitalopram oxalate  10 mg Oral Daily   ??? fluticasone propion-salmeterol  1 puff Inhalation BID   ??? ipratropium  500 mcg Nebulization Q6H (RT)   ??? nicotine  1 patch Transdermal Daily   ??? polyethylene glycol  17 g Oral Daily   ??? predniSONE  40 mg Oral Daily   ??? vancomycin  1,250 mg Intravenous Q12H     Continuous infusions:     PRN medications:   acetaminophen, acetaminophen, baclofen, hydrALAZINE, magnesium sulfate in water, MORPhine injection, ondansetron, oxyCODONE, potassium chloride in water                   Physical Exam     All vital signs for the past 24 hours have been reviewed.  All laboratory studies resulted in the past 24 hours have been reviewed.     General Exam:  General: Awake and alert.   Appears comfortable. No obvious distress.   ENT:  Mucous membranes moist. Oropharynx clear.  Cardiovascular:  Regular rate and rhythm.  No murmurs.   2+ radial pulses bilaterally.    Respiratory: Breathing is comfortable and unlabored.  Lungs are clear to ausculation bilaterally.    Gastrointestinal: soft diffusely tender throughout  Extremities: Warm and well-perfused. No cyanosis, clubbing, or edema.  Skin: No obvious rashes or ecchymoses.    Neurological Exam:  Mental Status  LOC: awake and alert  Orientation: person, place and time  Neglect: not present  Speech: normal  Language: fluent with intact naming, repetition, comprehension    Cranial Nerves  Pupils: PERRL  Corneals: present bilaterally  Gaze: normal  Face Sensory: intact and symmetric  Face Motor: normal and symmetric  Cough: strong  Tongue: midline    Motor:   RUE: commands and 5/5  LUE: commands and 5/5  RLE: commands and 5/5  LLE: commands and 5/5     Sensory:    Sensory is intact to light touch    Glasgow Coma Score  Motor: obeys commands = 6  Verbal: oriented = 5  Eyes: eyes open spontaneously = 4        DISPOSITION     The patient requires admission to the [facility] for management of the above conditions.    Estimated Transfer Date:  07/13/18        BILLING     This patient is critically ill or injured with the impairment of vital organ systems such that there is a high probability of imminent or life threatening deterioration in the patient's condition. The reason the patient is critically ill and the nature of the treatment and management provided to manage the critically ill patient as above.  ??  I directly provided 35 minutes of critical care time as documented in this note. Time includes: direct patient care, patient reassessment, coordination of patient care, interpretation of data, review of patient medical records, and documentation of patient care. This time is exclusive of separately billable procedures.      Caro Hight, MD  Neurocritical Care Attending Physician  07/14/18 7:55 AM

## 2018-07-15 ENCOUNTER — Ambulatory Visit: Payer: Self-pay | Admitting: Family

## 2018-07-15 LAB — CBC
HEMOGLOBIN: 8.6 g/dL — ABNORMAL LOW (ref 12.0–16.0)
MEAN CORPUSCULAR HEMOGLOBIN CONC: 32.9 g/dL (ref 31.0–37.0)
MEAN CORPUSCULAR HEMOGLOBIN: 32 pg (ref 26.0–34.0)
MEAN CORPUSCULAR VOLUME: 97 fL (ref 80.0–100.0)
MEAN PLATELET VOLUME: 8.1 fL (ref 7.0–10.0)
RED BLOOD CELL COUNT: 2.68 10*12/L — ABNORMAL LOW (ref 4.00–5.20)
RED CELL DISTRIBUTION WIDTH: 14.2 % (ref 12.0–15.0)
WBC ADJUSTED: 6.3 10*9/L (ref 4.5–11.0)

## 2018-07-15 LAB — BASIC METABOLIC PANEL
ANION GAP: 3 mmol/L — ABNORMAL LOW (ref 9–15)
BLOOD UREA NITROGEN: 9 mg/dL (ref 7–21)
CALCIUM: 8.5 mg/dL (ref 8.5–10.2)
CHLORIDE: 104 mmol/L (ref 98–107)
CO2: 31 mmol/L — ABNORMAL HIGH (ref 22.0–30.0)
CREATININE: 0.46 mg/dL — ABNORMAL LOW (ref 0.60–1.00)
EGFR CKD-EPI AA FEMALE: >90 mL/min/{1.73_m2} (ref >=60)
EGFR CKD-EPI NON-AA FEMALE: >90 mL/min/{1.73_m2} (ref >=60)
GLUCOSE RANDOM: 92 mg/dL (ref 65–179)
POTASSIUM: 3.5 mmol/L (ref 3.5–5.0)
SODIUM: 138 mmol/L (ref 135–145)

## 2018-07-15 LAB — PHOSPHORUS: Phosphate:MCnc:Pt:Ser/Plas:Qn:: 2.6 — ABNORMAL LOW

## 2018-07-15 LAB — POTASSIUM: Potassium:SCnc:Pt:Ser/Plas:Qn:: 3.5

## 2018-07-15 LAB — MAGNESIUM: Magnesium:MCnc:Pt:Ser/Plas:Qn:: 1.7

## 2018-07-15 LAB — HEMOGLOBIN: Lab: 8.6 — ABNORMAL LOW

## 2018-07-15 NOTE — Unmapped (Signed)
OCCUPATIONAL THERAPY  Evaluation (07/14/18 1610)    Patient Name:  Jennifer Lewis       Medical Record Number: [id number]   Date of Birth: [birth year]  Sex: Female          OT Treatment Diagnosis:  OT consult s/p coil embolization    Assessment  Jennifer Lewis is a 57 y.o. 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 The patient is admitted to NSICU following coil embolization of right ICA aneurysm. Pt. presents with uncontrolled pain, activity tolerance deficits, fatigue, diziness, and shortness of breath impacting safe ADL management and functional mobility. AMPAC raw score 18/24 pt. is considered to be 46.65% impaired indicating pt. would benefit from 5x/week high intensive post acute OT.         Activity Tolerance During Today's Session  Patient limited by fatigue;Patient limited by pain    Plan  Planned Frequency of Treatment:  1-2x per day for: 3-4x week       Planned Interventions:  Adaptive equipment;ADL retraining;Compensatory tech. training;Conservation;Education - Family / caregiver;Endurance activities;Functional mobility;Home exercise program;Safety education;Therapeutic exercise;Transfer training    Post-Discharge Occupational Therapy Recommendations:  OT Post Acute Discharge Recommendations: 5x weekly;High intensity(Pt. likely to progress)    ;    OT DME Recommendations: Defer to post acute    GOALS:   Patient and Family Goals: To go home    Long Term Goal #1: Pt. will score 24/24 on AMPAC within 4 weeks       Short Term:  Pt. will complete full body dressing with independence   Time Frame : 2 weeks  Pt. will complete hall level mobility with SBA and LRAD to increase safety in navigating areas of ADL   Time Frame : 2 weeks  Pt. will complete toilet transfer and toilet routine with independence   Time Frame : 2 weeks                  Prognosis: Good  Positive Indicators:  Motivation and support  Barriers to Discharge: Endurance deficits;Inability to safely perform ADLS;Pain    Subjective  Current Status Pt. left semi-reclined in bed with call bell in reach and all immediate needs met  Prior Functional Status Pt. reported independence with ADL management mostly. Pt. reported having bad days where she required help getting around and getting in and out of the tub from her husband. Pt. reported using a cane for functional mobility as well on her bad days          Patient / Caregiver reports: It is just MS pain, if it comes and goes then I can handle it, it gets really bad when it takes up residence    Past Medical History:   Diagnosis Date   ??? Aneurysm (CMS-HCC)    ??? Anxiety    ??? COPD (chronic obstructive pulmonary disease) (CMS-HCC)    ??? Depression    ??? Fibromyalgia    ??? Fibromyalgia, primary 2011   ??? Headache, tension-type 2011    Worse and more frequent in the past 4-5 months.   ??? MS (multiple sclerosis) (CMS-HCC)    ??? Restless leg syndrome    ??? Sleep apnea, obstructive 2016    Sleep study done.  Used a CPAP.   ??? Stroke (CMS-HCC) 2005    Social History     Tobacco Use   ??? Smoking status:  Current Every Day Smoker     Packs/day: 1.00     Years: 36.00     Pack years: 36.00     Types: Cigarettes   ??? Smokeless tobacco: Never Used   Substance Use Topics   ??? Alcohol use: No     Alcohol/week: 0.0 standard drinks      Past Surgical History:   Procedure Laterality Date   ??? CHOLECYSTECTOMY     ??? colonoscopy     ??? HYSTERECTOMY     ??? IR EMBOLIZATION INTRACRANIAL SPINAL  05/17/2017    IR EMBOLIZATION INTRACRANIAL SPINAL 05/17/2017 Altamease Oiler, MD [facility]   ??? IR EMBOLIZATION INTRACRANIAL SPINAL  07/09/2018    IR EMBOLIZATION INTRACRANIAL SPINAL 07/09/2018 Altamease Oiler, MD [facility]    Family History   Problem Relation Age of Onset   ??? Cancer Father    ??? Hypertension Brother    ??? Heart disease Brother    ??? Cancer Maternal Grandfather ??? Cancer Paternal Grandfather    ??? Cancer Paternal Uncle    ??? COPD Brother    ??? Heart attack Brother    ??? Stroke Mother    ??? Stroke Maternal Grandmother    ??? Thyroid disease Maternal Grandmother    ??? Multiple sclerosis Neg Hx    ??? Aneurysm Neg Hx         Diazepam     Objective Findings  Precautions / Restrictions  Falls precautions    Weight Bearing  Non-applicable    Required Braces or Orthoses  Non-applicable    Communication Preference       Pain  Pt. reported pain at 7 out of 10, RN made aware and gave pain meds    Equipment / Environment  Supplemental oxygen;Vascular access (PIV, TLC, Port-a-cath, PICC);Telemetry    Living Situation   Living environment: House   Lives With: Spouse   Home Living: One level home;Stairs to enter with rails;Tub/shower unit;Grab bars in shower;Standard height toilet(Pt. reported talking with husband to get shower chair)   Equipment available at home: [employer] placement (outside): Bilateral rails        Cognition   Orientation Level:  Oriented x 4   Arousal/Alertness:  Appropriate responses to stimuli   Attention Span:  Appears intact   Memory:  Appears intact   Following Commands:  Follows all commands and directions without difficulty   Safety Judgment:  Good awareness of safety precautions   Awareness of Errors:  Good awareness of safety precautions   Problem Solving:  Able to problem solve independently   Comments:      Vision / Perception  Vision: No visual deficits     Comments: Pt. reported no visual changes and was able to visually track    Hand Function     Bilateral hand WFL    Skin Inspection  Notable bruising around R wrist    ROM / Strength/Coordination  UE ROM/ Strength/ Coordination: BUE WFL  LE ROM/ Strength/ Coordination: Defer to PT    Sensation:  Pt. reported having occasional numbness and tingling in hands and feet    Balance:  Sitting SBA, standing CGA    Mobility/Gait/Transfers: Supine to sit min A, sit to stand CGA    ADL:  Bathing: Min A seated  Grooming: Setup/supervision  Dressing: Setup/supervision  Eating: Setup/supervision  Toileting: Min A       Vitals/ Orthostatics:  At Rest: VSS per monitor  With Activity:  VSS per monitor       Interventions Performed During Today's Session: Pt. educated on role of OT, compensatory dressing, safety, transfers, energy conservation. Pt. completed supine to sit with min A and able to tolerate ~4 minutes sitting EOB with SBA. Pt. completed sit to stand with CGA and able to tolerate ~2 minutes standing. Pt. able to complete bed mobility with supervision and donning/doffing socks with supervision.          Eval Duration (OT): 18 Min.         I attest that I have reviewed the above information.  Signed: Samuel Bouche, OT  Filed 07/14/2018     I was physically present and immediately available to direct and supervise tasks that were related to patient management. The direction and supervision was continuous throughout the time these tasks were performed.     Samuel Bouche, OT

## 2018-07-15 NOTE — Unmapped (Signed)
NEUROSURGERY PROGRESS NOTE    Brief History of Present Illness  Jennifer Lewis is a 57 y.o. female with history of left ophthalmic aneurysm s/p pipeline embolization (04/25/2017) now s/p coil embolization of right cavernous ICA aneurysm (07/09/18).    Subjective/Interval History  No acute events. Exam stable. Headache overnight, improving with pain medications. Weaning high flow nasal cannula. Hemoglobin stable.    Interval Imaging Reviewed  No new neuro-imaging    Neurological Assessment and Plan  **Left ophthalmic aneurysm and right cavernous ICA aneurysm  PED 6 s/p coil embo of R cavernous ICA aneurysm (07/09/18)  - Na>135  - SBP<180  - Continue ASA  - COPD exacerbation: Continue to wean high flow nasal cannula  - Pneumonia: LLL pneumonia seen on abdominal CT, Vanc/Cefepime for 7 days  - Cultures: blood cx NG x 72 hrs, respiratory cx (7/19): no growth/inadequate specimen    Disposition: NSICU.    This patient is co-managed with the [facility]. The management of this patient was discussed in detail with them and we agree with their overall assessment and plan.    ___________________________________________________________________    Neurological Exam  EOSp  Ox3  PERRL  EOMI  F=  TML  5/5 x 4  No PND    The patient's vitals, intake/output, labs, orders, and relevant imaging were reviewed for the last 24 hours.    Problem List  Active Problems:    * No active hospital problems. *  Resolved Problems:    * No resolved hospital problems. *

## 2018-07-15 NOTE — Unmapped (Signed)
Neurocritical Care   Progress Note     CODE STATUS:  FULL    Date of service: 07/15/2018  Hospital Day:  LOS: 6 days           Assessment and Plan     Jennifer Lewis is a 57 y.o. 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 The patient is admitted to NSICU following coil embolization of right ICA aneurysm.    24 Hour Events: No acute events overnight, HFNC continues to be weaned.     Neuro:  Daily interruption of sedation: N/A  Multi-Modality Monitoring: none  Current anticonvulsants: not indicated.  Mobilty protocol current stage: 3  Splints/PRAFO:  No  Restraints:  Not indicated  CAM-ICU:  Negative    **Right ICA aneurysm s/p coil embolization  - clinically with intact neurologic exam  - continue ASA 81 mg daily, likely restart Plavix 75 mg tomorrow  - SBP goal <160  - Sodium goal >135, checking daily  - PT/OT consults, defer today   - patient remains at risk for neurological decline due to post-op hemorrhage, cerebral edema, and seizures and requires close monitoring in the NSICU      **Depression  - Continue home Lexapro    **MS  - Continue home Tecfidera and PRN Baclofen  - Continue Tegretol for paryoxysmal sensory MS symptoms     **Pain/sedation  - tylenol prn mild pain  - oxycodone prn moderate pain     Recent Labs   Lab Units 07/15/18  0342   SODIUM mmol/L 138       Cardiovascular:  Admission weight: 72  Daily Weight: 77.7 kg (171 lb 4.8 oz)      Cardiac enzymes: No results in the last day      **Hemodynamic goals  - Current vasoactive medications:  None  - MAP > 65  - goal SBP < 160     **Hyperlipidemia  - Continue atorvastatin 20 mg daily (home med)    Heme  Current DVT prophylaxis: SCDs, lovenox 40mg     **Right Retroperitoneal Hematoma  - in the setting of recent groin puncture for cerebral angiogram  - PVL groin pseudoaneurysm protocol found no vascular abnormality  - CTA abd/pelvis confirmed retroperitoneal and peritoneal hemorrhage of the right hemipelvis       Recent Labs   Lab Units 07/15/18  0342 07/14/18  0350 07/13/18  0243 07/12/18  0339   HEMOGLOBIN g/dL 8.6* 8.5* 8.4* 9.2*   PLATELET COUNT (1) 10*9/L 256 203 165 171     Lab Results   Component Value Date    INR 0.98 07/11/2018    APTT 30.8 07/11/2018       Pulmonary  The HOB is elevated to greater than 30 degrees.  O2/Vent Settings:   FiO2 (%):  [40 %-60 %] 40 %  O2 Device: HFNC  O2 Flow Rate (L/min):  [35 L/min-40 L/min] 35 L/min    CXR: no new imaging    **COPD Exacerbation  - completed 3 days of prednisone 40mg   - tiotropium inh daily   - ipratropium neb q6 h  - advair inh BID    **Health Care Aquired Pneumonia  - LLL pneumonia on abdominal CT  - HFNC 35L 40% O2  - IS Q2 while awake  - Titrate O2 for sat > 88%  - Lasix 20 mg IV for fluid overload, pulmonary  edema     **Tobacco use  - smoking cessation counseling  - nicotine patch     GI  Weight: 77.7 kg (171 lb 4.8 oz)   Body mass index is 28.51 kg/m??.    GI prophylaxis: not indicated  The patient's last recorded bowel movement: 7/21  Bowel regimen: holding for diarrhea  Current nutritional source: Regular diet    **Nutrition  - regular diet    **Nausea  - zofran prn    Most recent LFTs:  Lab Results   Component Value Date    AST 33 01/02/2018    ALT 29 01/02/2018    ALKPHOS 106 01/02/2018    BILITOT 0.7 01/02/2018    PROT 7.7 01/02/2018    ALBUMIN 4.5 01/02/2018       Renal/Endocrine    Intake/Output Summary (Last 24 hours) at 07/15/2018 0753  Last data filed at 07/15/2018 0537  Gross per 24 hour   Intake 1368 ml   Output 1025 ml   Net 343 ml     The patient current insulin regimen for blood glucose control:  none  Foley catheter: No foley in place.      **Fluids  - lasix 20 mg IV x1 for fluid overload     **Hypokalemia   - replete K    **Hypomagnesemia  - replete Mg    **Hypophosphatemia  - replete Phos      Recent Labs   Lab Units 07/15/18 0342 07/14/18  0350 07/13/18  0243 07/12/18  0339   CREATININE mg/dL 1.61* 0.96* 0.45* 4.09*     No results found for: A1C  Potassium   Date Value Ref Range Status   07/15/2018 3.5 3.5 - 5.0 mmol/L Final     Magnesium   Date Value Ref Range Status   07/15/2018 1.7 1.6 - 2.2 mg/dL Final     Phosphorus   Date Value Ref Range Status   07/15/2018 2.6 (L) 2.9 - 4.7 mg/dL Final         ID  Temp (24hrs), Avg:37 ??C (98.6 ??F), Min:36.5 ??C (97.7 ??F), Max:37.1 ??C (98.8 ??F)    Recent Labs   Lab Units 07/15/18  0342 07/14/18  0350 07/13/18  0243 07/12/18  0339   WBC 10*9/L 6.3 5.1 5.8 8.4       **Health Care Aquired Pneumonia  - LLL Pneumonia on Abdominal CT  - Cefepime day #4/7  - Completed 3 days of Vancomycin  - cultures with inadequate specimen      Current Access:         -  PIV x 2       - Arterial line: No arterial line present.       - Central venous line: No central line present.     Current antibiotics:  ??? acyclovir (ZOVIRAX) tablet 400 mg Oral Nightly   ??? cefepime (MAXIPIME) 1 g in sodium chloride 0.9 % (NS) 100 mL IVPB-connector bag Intravenous Q6H     Cultures:  Blood Culture, Routine (no units)   Date Value   07/12/2018 No Growth at 72 hours     Lower Respiratory Culture (no units)   Date Value   07/12/2018 Specimen Not Processed     WBC (10*9/L)   Date Value   07/15/2018 6.3        Last CSF Analysis: No results found for: COLORCSF, APPEARCSF, NUCCELLSCSF, RBCCSF, NCSF, LYMPHSCSF, MONOMACCSF, EOSCSF, OTHERCSF, SPUNA, PROTCSF, GLUCCSF    Tubes and drains:  Patient Lines/Drains/Airways Status  Active Active Lines, Drains, & Airways     Name:   Placement date:   Placement time:   Site:   Days:    Peripheral IV 12/28/17 Right Antecubital   12/28/17    0930    Antecubital   198    Peripheral IV Left Hand   ???    ???    Hand       Peripheral IV 07/15/18 Right Antecubital   07/15/18    0200    Antecubital   less than 1                         Objective Data        Temp:  [36.5 ??C (97.7 ??F)-37.1 ??C (98.8 ??F)] 37.1 ??C (98.8 ??F)  Heart Rate:  [94-129] 97  SpO2 Pulse:  [94-115] 98  Resp:  [13-39] 21  BP: (118-179)/(53-97) 131/76  MAP (mmHg):  [76-123] 94  FiO2 (%):  [40 %-60 %] 40 %  SpO2:  [87 %-97 %] 91 %    Intake/Output Summary (Last 24 hours) at 07/15/2018 0753  Last data filed at 07/15/2018 0537  Gross per 24 hour   Intake 1368 ml   Output 1025 ml   Net 343 ml     SpO2: 91 %  Height: 165.1 cm (5' 5)   Weight: 77.7 kg (171 lb 4.8 oz)   Body mass index is 28.51 kg/m??.         Hemodynamic/Invasive Device Data (24 hrs):       Ventilation/Oxygen Therapy (24hrs):  FiO2 (%):  [40 %-60 %] 40 %  O2 Device: HFNC  O2 Flow Rate (L/min):  [35 L/min-40 L/min] 35 L/min        Medications:  Scheduled medications:   ??? acyclovir  400 mg Oral Nightly   ??? albuterol  2.5 mg Nebulization Q6H (RT)   ??? aspirin  81 mg Oral Daily   ??? atorvastatin  20 mg Oral Nightly   ??? carBAMazepine  200 mg Oral BID   ??? Cefepime  1 g Intravenous Q6H   ??? cholecalciferol (vitamin D3)  1,000 Units Oral Daily   ??? dimethyl fumarate  240 mg Oral BID   ??? docusate sodium  100 mg Oral BID   ??? enoxaparin (LOVENOX) injection  40 mg Subcutaneous Q24H [facility]   ??? escitalopram oxalate  10 mg Oral Daily   ??? fluticasone propion-salmeterol  1 puff Inhalation BID   ??? ipratropium  500 mcg Nebulization Q6H (RT)   ??? nicotine  1 patch Transdermal Daily     Continuous infusions:     PRN medications:   acetaminophen, acetaminophen, baclofen, hydrALAZINE, labetalol, magnesium sulfate in water, MORPhine injection, ondansetron, oxyCODONE, potassium chloride in water                   Physical Exam     All vital signs for the past 24 hours have been reviewed.  All laboratory studies resulted in the past 24 hours have been reviewed.     General Exam:  General: Awake and alert.   Appears comfortable. No obvious distress.   ENT:  Mucous membranes moist. Oropharynx clear.  Cardiovascular:  Regular rate and rhythm.  No murmurs.   2+ radial pulses bilaterally.    Respiratory: Breathing is comfortable and unlabored.  Lungs are clear to ausculation bilaterally.    Gastrointestinal: soft mild diffuse tenderness  Extremities: Warm and well-perfused. No cyanosis, clubbing, or edema.  Skin: No obvious rashes or ecchymoses.    Neurological Exam:  Mental Status  LOC: awake and alert  Orientation: person, place and time  Neglect: not present  Speech: normal  Language: fluent with intact naming, repetition, comprehension    Cranial Nerves  Pupils: PERRL  Corneals: present bilaterally  Gaze: normal  Face Sensory: intact and symmetric  Face Motor: normal and symmetric  Cough: strong  Tongue: midline    Motor:   RUE: commands and 5/5  LUE: commands and 5/5  RLE: commands and 5/5  LLE: commands and 5/5     Sensory:    Sensory is intact to light touch    Glasgow Coma Score  Motor: obeys commands = 6  Verbal: oriented = 5  Eyes: eyes open spontaneously = 4        DISPOSITION     The patient requires admission to the [facility] for management of the above conditions.    Estimated Transfer Date:  07/16/18        BILLING     Hillery Hunter, MD  Neurocritical Care Fellow  07/15/18 7:53 AM

## 2018-07-15 NOTE — Unmapped (Signed)
Pt remains on HFNC. Currently at 40%, 35 Liters. FiO2 was weaned from 50 to 40% this shift to maintain sat goal >88%. Inhaled medications given as ordered.

## 2018-07-15 NOTE — Unmapped (Signed)
Patient remain on HFNC 35L / 50% and received all scheduled breathing treatments, incentive spirometry 1000-ml. RT will continue to monitor and wean HFNC as tolerated.

## 2018-07-15 NOTE — Unmapped (Signed)
Neuro: Alert and orientedx4. Pupils equal and reactive. Can overcome resistancex4, full sensation.     Cardiac: Requiring PRN for SBP>160. NSR to ST.     Resp: High flow nasal cannula, maintaining sats>88.     GI/GU: No BM this shift. Voiding on own in bedside commode.     Afebrile. C/O pain, given PRN. Will continue to monitor.     Problem: Adult Inpatient Plan of Care  Goal: Plan of Care Review  Outcome: Progressing  Goal: Patient-Specific Goal (Individualization)  Outcome: Progressing  Goal: Absence of Hospital-Acquired Illness or Injury  Outcome: Progressing  Goal: Optimal Comfort and Wellbeing  Outcome: Progressing  Goal: Readiness for Transition of Care  Outcome: Progressing  Goal: Rounds/Family Conference  Outcome: Progressing     Problem: Skin Injury Risk Increased  Goal: Skin Health and Integrity  Outcome: Progressing     Problem: Pain Chronic (Persistent)  Goal: Acceptable Pain Control and Functional Ability  Outcome: Progressing     Problem: Fall Injury Risk  Goal: Absence of Fall and Fall-Related Injury  Outcome: Progressing     Problem: Chronic Pain (Multiple Sclerosis)  Goal: Acceptable Pain Control  Outcome: Progressing     Problem: Fatigue (Multiple Sclerosis)  Goal: Optimal Energy for Daily Activity  Outcome: Progressing     Problem: Mobility Impairment (Multiple Sclerosis)  Goal: Safety with Mobility  Outcome: Progressing     Problem: Self-Care Deficit (Multiple Sclerosis)  Goal: Ability to Complete Activities of Daily Living  Outcome: Progressing     Problem: COPD Comorbidity  Goal: Maintenance of COPD Symptom Control  Outcome: Progressing     Problem: Hypertension Comorbidity  Goal: Blood Pressure in Desired Range  Outcome: Progressing     Problem: Pain Chronic (Persistent) (Comorbidity Management)  Goal: Acceptable Pain Control and Functional Ability  Outcome: Progressing     Problem: Breathing Pattern Ineffective  Goal: Effective Breathing Pattern  Outcome: Progressing

## 2018-07-15 NOTE — Unmapped (Signed)
Pt is A&O x4 and able to follow command. Strong in RUE and BLE, and moderate in LUE. SBP goal <160, pain controlled with PRN tylenol and oxy. Regular diet, LBM was yesterday. Pt is on HFNC 40% at 35L and is currently weaning down.     Problem: Adult Inpatient Plan of Care  Goal: Plan of Care Review  Outcome: Progressing  Goal: Patient-Specific Goal (Individualization)  Outcome: Progressing  Goal: Absence of Hospital-Acquired Illness or Injury  Outcome: Progressing  Goal: Optimal Comfort and Wellbeing  Outcome: Progressing  Goal: Readiness for Transition of Care  Outcome: Progressing  Goal: Rounds/Family Conference  Outcome: Progressing     Problem: Skin Injury Risk Increased  Goal: Skin Health and Integrity  Outcome: Progressing     Problem: Pain Chronic (Persistent)  Goal: Acceptable Pain Control and Functional Ability  Outcome: Progressing     Problem: Fall Injury Risk  Goal: Absence of Fall and Fall-Related Injury  Outcome: Progressing     Problem: Chronic Pain (Multiple Sclerosis)  Goal: Acceptable Pain Control  Outcome: Progressing     Problem: Fatigue (Multiple Sclerosis)  Goal: Optimal Energy for Daily Activity  Outcome: Progressing     Problem: Mobility Impairment (Multiple Sclerosis)  Goal: Safety with Mobility  Outcome: Progressing     Problem: Self-Care Deficit (Multiple Sclerosis)  Goal: Ability to Complete Activities of Daily Living  Outcome: Progressing     Problem: COPD Comorbidity  Goal: Maintenance of COPD Symptom Control  Outcome: Progressing     Problem: Hypertension Comorbidity  Goal: Blood Pressure in Desired Range  Outcome: Progressing     Problem: Pain Chronic (Persistent) (Comorbidity Management)  Goal: Acceptable Pain Control and Functional Ability  Outcome: Progressing     Problem: Breathing Pattern Ineffective  Goal: Effective Breathing Pattern  Outcome: Progressing

## 2018-07-16 LAB — BASIC METABOLIC PANEL
ANION GAP: 4 mmol/L — ABNORMAL LOW (ref 9–15)
BUN / CREAT RATIO: 32
CALCIUM: 8.4 mg/dL — ABNORMAL LOW (ref 8.5–10.2)
CHLORIDE: 104 mmol/L (ref 98–107)
CREATININE: 0.5 mg/dL — ABNORMAL LOW (ref 0.60–1.00)
EGFR CKD-EPI AA FEMALE: >90 mL/min/{1.73_m2} (ref >=60)
EGFR CKD-EPI NON-AA FEMALE: >90 mL/min/{1.73_m2} (ref >=60)
GLUCOSE RANDOM: 82 mg/dL (ref 65–179)
SODIUM: 137 mmol/L (ref 135–145)

## 2018-07-16 LAB — CBC
HEMATOCRIT: 26.4 % — ABNORMAL LOW (ref 36.0–46.0)
HEMOGLOBIN: 8.7 g/dL — ABNORMAL LOW (ref 12.0–16.0)
MEAN CORPUSCULAR HEMOGLOBIN CONC: 33 g/dL (ref 31.0–37.0)
MEAN CORPUSCULAR HEMOGLOBIN: 32.6 pg (ref 26.0–34.0)
MEAN CORPUSCULAR VOLUME: 98.8 fL (ref 80.0–100.0)
RED BLOOD CELL COUNT: 2.68 10*12/L — ABNORMAL LOW (ref 4.00–5.20)
RED CELL DISTRIBUTION WIDTH: 14.6 % (ref 12.0–15.0)
WBC ADJUSTED: 5.2 10*9/L (ref 4.5–11.0)

## 2018-07-16 LAB — PHOSPHORUS: Phosphate:MCnc:Pt:Ser/Plas:Qn:: 3.4

## 2018-07-16 LAB — MAGNESIUM: Magnesium:MCnc:Pt:Ser/Plas:Qn:: 2

## 2018-07-16 LAB — RED CELL DISTRIBUTION WIDTH: Lab: 14.6

## 2018-07-16 LAB — GLUCOSE RANDOM: Glucose:MCnc:Pt:Ser/Plas:Qn:: 82

## 2018-07-16 LAB — PRO-BNP: Natriuretic peptide.B prohormone N-Terminal:MCnc:Pt:Ser/Plas:Qn:: 408 — ABNORMAL HIGH

## 2018-07-16 NOTE — Unmapped (Signed)
[facility] Hospitalist Consult Note    Requesting Physician:  Deniece Ree Sasaki-Adams* of Neurosurgery [facility])  Primary Care Physician: Allegra Grana, NP      Assessment/Recommendations:  *Right cavernous ICA aneurysm s/p coil embolization 7/16.  Groin puncture for angiogram complicated by retroperitoneal hematoma with acute blood loss anemia.    *Hospital-acquired PNA w acute hypoxia.  Improving nicely.  CT images, CXR images reviewed by me.  On POCUS by me, there is really no significant pleural effusion; she still has abnormal appearing L lower lung, which is expected.  Resp cx not processed due to unsuitable specimen.  (Vancomycin 7/19-7/20)  -Continue cefepime (started 7/19) through today.  Does not need antibiotics on discharge as she has virtually completed 7 days with definite improvement.  -Will need home O2 given baseline COPD and fact that she is 87% on RA at rest.  I am ok w her going home if Neurosurgery is planning for the same as she really wants to get home to take care of her mother.  She has clearly had less and less O2 requirement each day so I anticipate she will only need O2 in the short term.  She knows not to smoke while on oxygen.  -Follow up with PCP.  She is aware she needs to arrange this.    *COPD.  Acute exacerbation COPD resolved following 3d of prednisone.  Ongoing tobacco use but still in precontemplative phase so not truly ready to quit.  -Continue home fluticasone-salmeterol, tiotropium.  -Wean O2 to keep O2 sats 88% or better.    *Hypertension.  -Continue lisinopril    *Multiple sclerosis.  -Continue home baclofen, dimethyl fumarate (Tecfidera), carbamazepine.    *Depression.  -Continue home escitalopram.      Thank you for this consult.  She Royall be discharged.  Discussed with primary team.        History of Present Illness:   Reason for Consult: Medical comanagement    History of Present Illness:  57 y.o. white female admitted on 07/09/2018 for right cavernous ICA aneurysm coil embolization.  Stable chronic medical conditions of hypertension, multiple sclerosis, depression, COPD.  Following coil embolization, brought to NSICU for close monitoring.  Course complicated by right retroperitoneal bleed from groin puncture site requiring 1 unit PRBC, acute COPD exacerbation (treated with 3 days of prednisone), left lower hospital-acquired pneumonia.  Currently she is complaining of pain from the retroperitoneal bleed.  She is anxious to get home to take care of her mother.  Has not been on home O2 before, has not needed prednisone in past for COPD.  No SOB at rest.  Has had SOB w exertion (vacuuming several rooms at at time) for years.  No chest pain.    Allergies:   Allergies   Allergen Reactions   ??? Diazepam Anxiety       Home Medications:  Prior to Admission medications    Medication Dose, Route, Frequency   acyclovir (ZOVIRAX) 400 MG tablet 400 mg, Oral, Nightly   aspirin 81 MG chewable tablet 81 mg, Oral, Daily (standard)   carBAMazepine (TEGRETOL) 200 mg tablet 200 mg, Oral, 2 times a day (standard)   cholecalciferol, vitamin D3, (VITAMIN D3) 1,000 unit capsule 1,000 Units, Oral, Daily (standard)   escitalopram oxalate (LEXAPRO) 10 MG tablet 10 mg, Oral, Daily (standard)   fluticasone-salmeterol (ADVAIR) 100-50 mcg/dose diskus 1 puff, Inhalation, 2 times a day (standard)   rosuvastatin (CRESTOR) 10 MG tablet 10 mg, Oral, Daily (standard)  albuterol sulfate (PROAIR RESPICLICK) 90 mcg/actuation AePB 180 mcg, Inhalation, Every 4 hours   baclofen (LIORESAL) 10 MG tablet 10 mg, Oral, 3 times a day PRN   multivit,iron,minerals/lutein (CENTRUM SILVER ULTRA WOMEN'S ORAL) 1 tablet, Oral, Daily (standard)   ondansetron (ZOFRAN-ODT) 4 MG disintegrating tablet 4 mg, Oral, Every 6 hours PRN   TECFIDERA 240 mg CpDR TAKE 1 CAPSULE BY MOUTH TWICE DAILY WITH MEALS       Current Medications:   Scheduled Meds:  ??? acyclovir  400 mg Oral Nightly   ??? aspirin  81 mg Oral Daily   ??? atorvastatin  20 mg Oral Nightly   ??? carBAMazepine  200 mg Oral BID   ??? Cefepime  1 g Intravenous Q6H   ??? cholecalciferol (vitamin D3)  1,000 Units Oral Daily   ??? dimethyl fumarate  240 mg Oral BID   ??? docusate sodium  100 mg Oral BID   ??? enoxaparin (LOVENOX) injection  40 mg Subcutaneous Q24H [facility]   ??? escitalopram oxalate  10 mg Oral Daily   ??? fluticasone propion-salmeterol  1 puff Inhalation BID   ??? furosemide  20 mg Intravenous Once   ??? lisinopril  10 mg Oral Daily   ??? nicotine  1 patch Transdermal Daily   ??? tiotropium  18 mcg Inhalation Daily (RT)       Medical History:   Past Medical History:   Diagnosis Date   ??? Aneurysm (CMS-HCC)    ??? Anxiety    ??? COPD (chronic obstructive pulmonary disease) (CMS-HCC)    ??? Depression    ??? Fibromyalgia    ??? Fibromyalgia, primary 2011   ??? Headache, tension-type 2011    Worse and more frequent in the past 4-5 months.   ??? MS (multiple sclerosis) (CMS-HCC)    ??? Restless leg syndrome    ??? Sleep apnea, obstructive 2016    Sleep study done.  Used a CPAP.   ??? Stroke (CMS-HCC) 2005       Surgical History:   Past Surgical History:   Procedure Laterality Date   ??? CHOLECYSTECTOMY     ??? colonoscopy     ??? HYSTERECTOMY     ??? IR EMBOLIZATION INTRACRANIAL SPINAL  05/17/2017    IR EMBOLIZATION INTRACRANIAL SPINAL 05/17/2017 Altamease Oiler, MD IMG [facility]IR [facility]   ??? IR EMBOLIZATION INTRACRANIAL SPINAL  07/09/2018    IR EMBOLIZATION INTRACRANIAL SPINAL 07/09/2018 Altamease Oiler, MD IMG VIR [facility]       Social History:  Lives with husband.  Social History     Tobacco Use   ??? Smoking status: Current Every Day Smoker     Packs/day: 1.00     Years: 36.00     Pack years: 36.00     Types: Cigarettes   ??? Smokeless tobacco: Never Used   Substance Use Topics   ??? Alcohol use: No     Alcohol/week: 0.0 standard drinks       Family History:   Family History   Problem Relation Age of Onset   ??? Cancer Father    ??? Hypertension Brother    ??? Heart disease Brother    ??? Cancer Maternal Grandfather    ??? Cancer Paternal Grandfather ??? Cancer Paternal Uncle    ??? COPD Brother    ??? Heart attack Brother    ??? Stroke Mother    ??? Stroke Maternal Grandmother    ??? Thyroid disease Maternal Grandmother    ??? Multiple sclerosis Neg  Hx    ??? Aneurysm Neg Hx        Code Status: Full Code    Review of Systems: 10 systems reviewed and negative unless noted otherwise above.      Objective:    Physical Exam  Vitals (last 24 hrs): Temp:  [36.9 ??C (98.5 ??F)-37.6 ??C (99.7 ??F)] 37.6 ??C (99.7 ??F)  Heart Rate:  [94-124] 97  SpO2 Pulse:  [93-124] 97  Resp:  [12-27] 23  BP: (127-170)/(53-84) 152/76  MAP (mmHg):  [70-111] 99  SpO2:  [90 %-95 %] 92 %   General: Lying comfortably in bed, no acute distress.   Eyes: Anicteric sclerae, no conjunctival injection.  Pupils equal.   ENT: Mucous membranes moist.  Normal appearing ears, nose.   Neck: Midline trachea.  No obvious goiter.   Respiratory: Normal respiratory effort.  L basilar insp crackles w/o egophony.   Cardiovascular: Regular rhythm w/o murmur.  Radial pulses 2+ bilaterally.  No lower extremity edema.   Gastrointestinal: Nondistended.   Skin: No obvious rashes or breakdown.  Warm, dry.   Musculoskeletal: Motor strength in all extremities grossly intact.   Psychiatric: Alert.  Answers questions appropriately.  Judgment and insight intact.   Neurologic: Extraocular movements intact, no facial asymmetry.  No gross sensory deficits.       ECG: As read by me, shows normal sinus rhythm, normal axis, normal PR interval, normal QRS width, normal-appearing QT interval and PVC's.  TW flattening in II, III, aVL, aVF.    Imaging: I reviewed the images myself.  1.  1 view of chest on 7/18 showed LLL infiltrate  2.  CT chest 7/19 showed small L pleural effusion and LLL atelectasis.      Laboratory Data: I reviewed the studies myself.  Recent Labs     08-06-18  0309   WBC 5.2   HGB 8.7*   HCT 26.4*   PLT 266     Recent Labs     2018-08-06  0309   NA 137   K 4.0   CL 104   CO2 29.0   BUN 16   CREATININE 0.50*   GLU 82   CALCIUM 8.4* MG 2.0   PHOS 3.4        Order Current Status    Type and Screen Collected (07/10/18 1635)    Blood Culture #1 Preliminary result    Blood Culture #2 Preliminary result

## 2018-07-16 NOTE — Unmapped (Signed)
Patient weaned off HFNC and placed on 4 lpm [place], sp02 90- 93% and tolerating well. Patient received all scheduled breathing treatments, no complications. RT will continue to monitor.

## 2018-07-16 NOTE — Unmapped (Signed)
Neurocritical Care   Progress Note     CODE STATUS:  FULL    Date of service: 07/16/2018  Hospital Day:  LOS: 7 days           Assessment and Plan     Jennifer Lewis is a 57 y.o. 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 The patient is admitted to NSICU following coil embolization of right ICA aneurysm.    24 Hour Events: The patient complained of residual abdominal pain overnight. She was weaned to nasal cannula.     Neuro:  Daily interruption of sedation: N/A  Multi-Modality Monitoring: none  Current anticonvulsants: not indicated.  Mobilty protocol current stage: 3  Splints/PRAFO:  No  Restraints:  Not indicated  CAM-ICU:  Negative    **Right ICA aneurysm s/p coil embolization  - clinically with intact neurologic exam  - continue ASA 81 mg daily  - SBP goal <180  - Sodium goal >135, checking daily  - PT/OT consults  - patient remains at risk for neurological decline due to post-op hemorrhage, cerebral edema, and seizures and requires close monitoring in the NSICU      **Depression  - Continue home Lexapro    **MS  - Continue home Tecfidera and PRN Baclofen  - Continue Tegretol for paryoxysmal sensory MS symptoms     **Pain/sedation  - tylenol prn mild pain  - oxycodone prn moderate pain, increase dose to 10 mg today    Recent Labs   Lab Units 07/16/18  0309   SODIUM mmol/L 137       Cardiovascular:  Admission weight: 72  Daily Weight: 76.3 kg (168 lb 3.4 oz)      Cardiac enzymes: No results in the last day      **Hemodynamic goals  - Current vasoactive medications:  None  - MAP > 65  - goal SBP < 180     **HTN  - Lisinopril 10 mg daily     **Hyperlipidemia  - Continue atorvastatin 20 mg daily (home med)    Heme  Current DVT prophylaxis: SCDs, lovenox 40 mg    **Anemia  - secondary to blood loss in the setting of retroperitoneal hematoma  - Hb stable at this time    **Right Retroperitoneal Hematoma  - in the setting of recent groin puncture for cerebral angiogram  - PVL groin pseudoaneurysm protocol found no vascular abnormality  - CTA abd/pelvis confirmed retroperitoneal and peritoneal hemorrhage of the right hemipelvis       Recent Labs   Lab Units 07/16/18  0309 07/15/18  0342 07/14/18  0350 07/13/18  0243   HEMOGLOBIN g/dL 8.7* 8.6* 8.5* 8.4*   PLATELET COUNT (1) 10*9/L 266 256 203 165     Lab Results   Component Value Date    INR 0.98 07/11/2018    APTT 30.8 07/11/2018       Pulmonary  The HOB is elevated to greater than 30 degrees.  O2/Vent Settings:   FiO2 (%):  [40 %] 40 %  O2 Device: Nasal cannula  O2 Flow Rate (L/min):  [4 L/min-35 L/min] 4 L/min    CXR: no new imaging    **COPD Exacerbation  - completed 3 days of prednisone 40mg   - tiotropium inh daily   - d/c ipratropium   - advair inh PRN  - pro-BNP 408, will repeat lasix 20 mg  IV today  - patient will need outpatient follow up with pulmonology    **Health Care Aquired Pneumonia  - LLL pneumonia on abdominal CT  - IS Q2 while awake  - Titrate O2 for sat > 88%  - weaned to [place] 4L      **Tobacco use  - smoking cessation counseling  - nicotine patch     GI  Weight: 76.3 kg (168 lb 3.4 oz)   Body mass index is 27.99 kg/m??.    GI prophylaxis: not indicated  The patient's last recorded bowel movement: 7/21  Bowel regimen: colace  Current nutritional source: Regular diet    **Nutrition  - regular diet    **Nausea  - zofran prn    Most recent LFTs:  Lab Results   Component Value Date    AST 33 01/02/2018    ALT 29 01/02/2018    ALKPHOS 106 01/02/2018    BILITOT 0.7 01/02/2018    PROT 7.7 01/02/2018    ALBUMIN 4.5 01/02/2018       Renal/Endocrine    Intake/Output Summary (Last 24 hours) at 07/16/2018 5784  Last data filed at 07/16/2018 0700  Gross per 24 hour   Intake 1310 ml   Output 2025 ml   Net -715 ml     The patient current insulin regimen for blood glucose control:  none  Foley catheter: No foley in place. - check electrolytes daily   - continue to monitor I&O, giving Lasix 20 mg IV X1 for suspected fluid overload      Recent Labs   Lab Units 07/16/18  0309 07/15/18  0342 07/14/18  0350 07/13/18  0243   CREATININE mg/dL 6.96* 2.95* 2.84* 1.32*     No results found for: A1C  Potassium   Date Value Ref Range Status   07/16/2018 4.0 3.5 - 5.0 mmol/L Final     Magnesium   Date Value Ref Range Status   07/16/2018 2.0 1.6 - 2.2 mg/dL Final     Phosphorus   Date Value Ref Range Status   07/16/2018 3.4 2.9 - 4.7 mg/dL Final         ID  Temp (24hrs), Avg:37.2 ??C (99 ??F), Min:36.9 ??C (98.5 ??F), Max:37.5 ??C (99.5 ??F)    Recent Labs   Lab Units 07/16/18  0309 07/15/18  0342 07/14/18  0350 07/13/18  0243   WBC 10*9/L 5.2 6.3 5.1 5.8       **Health Care Aquired Pneumonia  - LLL Pneumonia on Abdominal CT  - Cefepime day #5/7  - cultures with inadequate specimen      Current Access:         -  PIV x 2       - Arterial line: No arterial line present.       - Central venous line: No central line present.     Current antibiotics:  ??? acyclovir (ZOVIRAX) tablet 400 mg Oral Nightly   ??? cefepime (MAXIPIME) 1 g in sodium chloride 0.9 % (NS) 100 mL IVPB-connector bag Intravenous Q6H     Cultures:  Blood Culture, Routine (no units)   Date Value   07/12/2018 No Growth at 4 days     Lower Respiratory Culture (no units)   Date Value   07/12/2018 Specimen Not Processed     WBC (10*9/L)   Date Value   07/16/2018 5.2        Last CSF Analysis: No results found for: COLORCSF, APPEARCSF, NUCCELLSCSF, RBCCSF, NCSF, LYMPHSCSF,  MONOMACCSF, EOSCSF, OTHERCSF, SPUNA, PROTCSF, GLUCCSF    Tubes and drains:  Patient Lines/Drains/Airways Status    Active Active Lines, Drains, & Airways     Name:   Placement date:   Placement time:   Site:   Days:    Peripheral IV 12/28/17 Right Antecubital   12/28/17    0930    Antecubital   199    Peripheral IV 07/15/18 Right Antecubital   07/15/18    0200    Antecubital   1                         Objective Data Temp:  [36.9 ??C (98.5 ??F)-37.5 ??C (99.5 ??F)] 37.2 ??C (99 ??F)  Heart Rate:  [94-124] 106  SpO2 Pulse:  [93-124] 106  Resp:  [12-29] 23  BP: (118-170)/(47-84) 162/75  MAP (mmHg):  [70-111] 105  FiO2 (%):  [40 %] 40 %  SpO2:  [90 %-95 %] 92 %    Intake/Output Summary (Last 24 hours) at 07/16/2018 0722  Last data filed at 07/16/2018 0700  Gross per 24 hour   Intake 1310 ml   Output 2025 ml   Net -715 ml     SpO2: 92 %  Height: 165.1 cm (5' 5)   Weight: 76.3 kg (168 lb 3.4 oz)   Body mass index is 27.99 kg/m??.         Hemodynamic/Invasive Device Data (24 hrs):       Ventilation/Oxygen Therapy (24hrs):  FiO2 (%):  [40 %] 40 %  O2 Device: Nasal cannula  O2 Flow Rate (L/min):  [4 L/min-35 L/min] 4 L/min        Medications:  Scheduled medications:   ??? acyclovir  400 mg Oral Nightly   ??? albuterol  2.5 mg Nebulization Q6H (RT)   ??? aspirin  81 mg Oral Daily   ??? atorvastatin  20 mg Oral Nightly   ??? carBAMazepine  200 mg Oral BID   ??? Cefepime  1 g Intravenous Q6H   ??? cholecalciferol (vitamin D3)  1,000 Units Oral Daily   ??? dimethyl fumarate  240 mg Oral BID   ??? docusate sodium  100 mg Oral BID   ??? enoxaparin (LOVENOX) injection  40 mg Subcutaneous Q24H [facility]   ??? escitalopram oxalate  10 mg Oral Daily   ??? fluticasone propion-salmeterol  1 puff Inhalation BID   ??? ipratropium  500 mcg Nebulization Q6H (RT)   ??? nicotine  1 patch Transdermal Daily   ??? tiotropium  18 mcg Inhalation Daily (RT)     Continuous infusions:     PRN medications:   acetaminophen, baclofen, hydrALAZINE, labetalol, magnesium sulfate in water, MORPhine injection, ondansetron, oxyCODONE, potassium chloride, potassium chloride, potassium chloride                   Physical Exam     All vital signs for the past 24 hours have been reviewed.  All laboratory studies resulted in the past 24 hours have been reviewed.     General Exam:  General: Awake and alert.   Appears comfortable. No obvious distress.   ENT:  Mucous membranes moist. Oropharynx clear.  Cardiovascular: Regular rate and rhythm.  No murmurs.   2+ radial pulses bilaterally.    Respiratory: Breathing is comfortable and unlabored.  Lungs are clear to ausculation bilaterally.    Gastrointestinal: soft mild diffuse tenderness  Extremities: Warm and well-perfused. No cyanosis, clubbing, or edema.  Skin:  No obvious rashes or ecchymoses.    Neurological Exam:  Mental Status  LOC: awake and alert  Orientation: person, place and time  Neglect: not present  Speech: normal  Language: fluent with intact naming, repetition, comprehension    Cranial Nerves  Pupils: PERRL  Corneals: present bilaterally  Gaze: normal  Face Sensory: intact and symmetric  Face Motor: normal and symmetric  Cough: strong  Tongue: midline    Motor:   RUE: commands and 5/5  LUE: commands and 5/5  RLE: commands and 5/5  LLE: commands and 5/5     Sensory:    Sensory is intact to light touch    Glasgow Coma Score  Motor: obeys commands = 6  Verbal: oriented = 5  Eyes: eyes open spontaneously = 4        DISPOSITION     The patient requires admission to the [facility] for management of the above conditions.    Estimated Transfer Date:  07/16/18        BILLING     Hillery Hunter, MD  Neurocritical Care Fellow  07/16/18 7:53 AM

## 2018-07-16 NOTE — Unmapped (Signed)
NEUROSURGERY PROGRESS NOTE    Brief History of Present Illness  Jennifer Lewis is a 57 y.o. female with history of left ophthalmic aneurysm s/p pipeline embolization (04/25/2017) now s/p coil embolization of right cavernous ICA aneurysm (07/09/18).    Subjective/Interval History  Exam stable, off oxygen    Interval Imaging Reviewed  No new neuro-imaging    Neurological Assessment and Plan  **Left ophthalmic aneurysm and right cavernous ICA aneurysm  PED 7 s/p coil embo of R cavernous ICA aneurysm (07/09/18)  - Na>135  - SBP<160  - Continue ASA  - COPD exacerbation, on room air  - Pneumonia, Cefepime d4/7  - Cultures NGTD   - f/u angiogram in 6 months (01/20)    Disposition: NSICU.    This patient is co-managed with the [facility]. The management of this patient was discussed in detail with them and we agree with their overall assessment and plan.    ___________________________________________________________________    Neurological Exam  EOSp  Ox3  PERRL  EOMI  F=  TML  5/5 x 4  No PND    The patient's vitals, intake/output, labs, orders, and relevant imaging were reviewed for the last 24 hours.    Problem List  Active Problems:    * No active hospital problems. *  Resolved Problems:    * No resolved hospital problems. *

## 2018-07-16 NOTE — Unmapped (Signed)
PHYSICAL THERAPY  Evaluation (07/16/18 945)     Patient Name:  Jennifer Lewis       Medical Record Number: [id number]   Date of Birth: Jun 06, 1961  Sex: Female            Treatment Diagnosis: PT evaluation; mobility training    ASSESSMENT    Jennifer Lewis is a 56 y.o. 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 The patient is admitted to NSICU following coil embolization of right ICA aneurysm. Patient presents with head/flank pain and a decrease in functional balance, strength, and mobility. Significant comorbidities noted that will impact presentation. AM-PAC raw score 18/24 of indicating that the patient is 40.47% impaired. Currently patient will benefit from 5x/wk skilled therapy to address impairments.        Today's Interventions: AM-PAC raw score 18/24 of indicating that the patient is 40.47% impaired. PT eval; mobility training; transfer training; PT education: role of PT, POC, mobility    Activity Tolerance: Patient tolerated treatment well    PLAN  Planned Frequency of Treatment:  1-2x per day for: 4-5x week      Planned Interventions: Balance activities;Education - Patient;Education - Family / caregiver;Endurance activities;Functional mobility;Gait training;Home exercise program;Neuromuscular re-education;Self-care / Home training;Stair training;Therapeutic exercise;Therapeutic activity;Transfer training;Diaphragmatic / Pursed-lip breathing;Airway Clearance;Postural re-education    Post-Discharge Physical Therapy Recommendations:  5x weekly;Low intensity        PT DME Recommendations: Defer to post acute     Goals:   Patient and Family Goals: return home    Long Term Goal #1: In 4-6 weeks, pt will be able to ambulate >1045ft w/ LRAD - supervision        SHORT GOAL #1: will be able to perform all transfers w/ LRAD - supervision              Time Frame : 1 week  SHORT GOAL #2: will be able to ambulate 14ft w/ LRAD - supervision              Time Frame : 1 week  SHORT GOAL #3: will be able to ascend/descend 4 stairs w/ LRAD - SBA               Time Frame : 2 weeks                                        Prognosis:  Fair(2/2 comorbidities )  Barriers to Discharge: Endurance deficits;Functional strength deficits;Gait instability;Impaired balance;Impulsivity;Inability to safely perform ADLS;Pain;Other(comorbidities)  Positive Indicators: family support    SUBJECTIVE  Patient reports: Patient is agreeable to treatment.   Current Functional Status: Patient was received in sidelying complaining of R sided head pain and R sided flank pain. (Husband present )  Services patient receives: OT;PT;SLP  Prior functional status: patient was community ambulator that used a straight cane around the house and a rollator for longer distances such as the grocery store; able to complete ADLs independently on most days but would require assistance from her husband occaisionally. Does not report any falls in past 5 months but reports 12-15 'almost falls'.  Equipment available at home: Cane;Rollator    Past Medical History:   Diagnosis Date   ??? Aneurysm (CMS-HCC)    ??? Anxiety    ??? COPD (chronic obstructive  pulmonary disease) (CMS-HCC)    ??? Depression    ??? Fibromyalgia    ??? Fibromyalgia, primary 2011   ??? Headache, tension-type 2011    Worse and more frequent in the past 4-5 months.   ??? MS (multiple sclerosis) (CMS-HCC)    ??? Restless leg syndrome    ??? Sleep apnea, obstructive 2016    Sleep study done.  Used a CPAP.   ??? Stroke (CMS-HCC) 2005    Social History     Tobacco Use   ??? Smoking status: Current Every Day Smoker     Packs/day: 1.00     Years: 36.00     Pack years: 36.00     Types: Cigarettes   ??? Smokeless tobacco: Never Used   Substance Use Topics   ??? Alcohol use: No     Alcohol/week: 0.0 standard drinks      Past Surgical History:   Procedure Laterality Date   ??? CHOLECYSTECTOMY     ??? colonoscopy     ??? HYSTERECTOMY     ??? IR EMBOLIZATION INTRACRANIAL SPINAL  05/17/2017    IR EMBOLIZATION INTRACRANIAL SPINAL 05/17/2017 Altamease Oiler, MD IMG VIR [facility]   ??? IR EMBOLIZATION INTRACRANIAL SPINAL  07/09/2018    IR EMBOLIZATION INTRACRANIAL SPINAL 07/09/2018 Altamease Oiler, MD IMG VIR [facility]    Family History   Problem Relation Age of Onset   ??? Cancer Father    ??? Hypertension Brother    ??? Heart disease Brother    ??? Cancer Maternal Grandfather    ??? Cancer Paternal Grandfather    ??? Cancer Paternal Uncle    ??? COPD Brother    ??? Heart attack Brother    ??? Stroke Mother    ??? Stroke Maternal Grandmother    ??? Thyroid disease Maternal Grandmother    ??? Multiple sclerosis Neg Hx    ??? Aneurysm Neg Hx         Allergies: Diazepam                Objective Findings              Precautions: Falls              Weight Bearing Status: Non- applicable              Required Braces or Orthoses: Non- applicable    Communication Preference: Verbal;Visual  Pain Comments: R sided head pain and R sided flank pain. (RN aware - brings in oxycodone )  Medical Tests / Procedures: 07/13/18 chest XR- Stable mild pulmonary edema with small bilateral pleural effusions. 07/12/18 chest CTA - No pulmonary embolism. Patchy heterogeneous opacities in posterior segment of left upper lobe, Batz represent aspiration. Small bilateral pleural effusions (left greater than right) with bibasilar subsegmental atelectasis.  Equipment / Environment: Vascular access (PIV, TLC, Port-a-cath, PICC);Telemetry;SCD;Supplemental oxygen    At Rest: HR=105; O2=92 4L([place])  With Activity: HR=110; O2=93% 4L([place])(floor status)  Orthostatics: NAD upon sitting  Airway Clearance: Inspiratory Spirometer    Living environment: House  Lives With: Spouse  Home Living: One level home;Stairs to enter with rails  Rail placement (outside): Bilateral rails     Number of Stairs: 4    Cognition: A&Ox4; responds to verbal commands/instructions. Appears lethargic.   Visual / Perception Status: No recent vision changes reported.   Skin Inspection: Intact     UE ROM: WFL  UE Strength: 3+ to 4-/5  LE ROM: WFL  LE Strength: > 3/5(limited  tolerance to formal MMT 2/2 pain )                Coordination: Could perform simple motor tasks; able to unplug lines by herself to use the bathroom   Proprioception: No deficits observed  Sensation: No deficits observed  Balance: Reports almost falling 12-15x in last 5 months   Posture: No significant deficits observed; prefers sidelying on R side in bed     Bed Mobility: Able to go from sidelying to supine to sit independently  Transfers: Able to sit to stand to sitting on toilet w/ CGA.    Gait: limited further mobility 2/2 pt need for [facility]   Stairs: deferred   Wheelchair Mobility: Not observed  Endurance: NAD w/ bed mobility/transfers    Eval Duration(PT): 20 Min.    Medical Staff Made Aware: RN Harriett Sine made aware of pt status; left patient on toilet with her husband.      I attest that I have reviewed the above information.  Signed: Loma Sender, PT  Filed 07/16/2018     I was physically present and immediately available to direct and supervise tasks that were related to patient management. The direction and supervision was continuous throughout the time these tasks were performed by J. Chauncey Reading, SPT.     Loma Sender, PT

## 2018-07-16 NOTE — Unmapped (Signed)
Neuro: Alert and oreintedx4. Overcomes resistancex4, reports feeling weaker, full sensation.     Cardiac: SBP>160, given PRN. NSR to ST.     Resp: 4L [place], tolerating well.     GI/GU: No BM this shift, voiding adequately. Tolerating diet.     Afebrile. C/O pain, given PRn. Will continue to monitor.     Problem: Adult Inpatient Plan of Care  Goal: Plan of Care Review  Outcome: Progressing  Goal: Patient-Specific Goal (Individualization)  Outcome: Progressing  Goal: Absence of Hospital-Acquired Illness or Injury  Outcome: Progressing  Goal: Optimal Comfort and Wellbeing  Outcome: Progressing  Goal: Readiness for Transition of Care  Outcome: Progressing  Goal: Rounds/Family Conference  Outcome: Progressing     Problem: Skin Injury Risk Increased  Goal: Skin Health and Integrity  Outcome: Progressing     Problem: Pain Chronic (Persistent)  Goal: Acceptable Pain Control and Functional Ability  Outcome: Progressing     Problem: Fall Injury Risk  Goal: Absence of Fall and Fall-Related Injury  Outcome: Progressing     Problem: Chronic Pain (Multiple Sclerosis)  Goal: Acceptable Pain Control  Outcome: Progressing     Problem: Fatigue (Multiple Sclerosis)  Goal: Optimal Energy for Daily Activity  Outcome: Progressing     Problem: Mobility Impairment (Multiple Sclerosis)  Goal: Safety with Mobility  Outcome: Progressing     Problem: Self-Care Deficit (Multiple Sclerosis)  Goal: Ability to Complete Activities of Daily Living  Outcome: Progressing     Problem: COPD Comorbidity  Goal: Maintenance of COPD Symptom Control  Outcome: Progressing     Problem: Hypertension Comorbidity  Goal: Blood Pressure in Desired Range  Outcome: Progressing     Problem: Pain Chronic (Persistent) (Comorbidity Management)  Goal: Acceptable Pain Control and Functional Ability  Outcome: Progressing     Problem: Breathing Pattern Ineffective  Goal: Effective Breathing Pattern  Outcome: Progressing

## 2018-07-16 NOTE — Unmapped (Signed)
Problem: Breathing Pattern Ineffective  Goal: Effective Breathing Pattern  Outcome: Progressing    Patient did well with doing all of their treatments tonight. Patient did their airway clearance without complications.

## 2018-07-17 LAB — CBC
HEMATOCRIT: 26.9 % — ABNORMAL LOW (ref 36.0–46.0)
MEAN CORPUSCULAR HEMOGLOBIN CONC: 32.6 g/dL (ref 31.0–37.0)
MEAN CORPUSCULAR VOLUME: 97.6 fL (ref 80.0–100.0)
MEAN PLATELET VOLUME: 7.8 fL (ref 7.0–10.0)
PLATELET COUNT: 256 10*9/L (ref 150–440)
RED BLOOD CELL COUNT: 2.76 10*12/L — ABNORMAL LOW (ref 4.00–5.20)
RED CELL DISTRIBUTION WIDTH: 14.7 % (ref 12.0–15.0)

## 2018-07-17 LAB — BASIC METABOLIC PANEL
ANION GAP: <1 mmol/L — ABNORMAL LOW (ref 9–15)
BLOOD UREA NITROGEN: 17 mg/dL (ref 7–21)
CALCIUM: 8.6 mg/dL (ref 8.5–10.2)
CHLORIDE: 100 mmol/L (ref 98–107)
CO2: 35 mmol/L — ABNORMAL HIGH (ref 22.0–30.0)
CREATININE: 0.52 mg/dL — ABNORMAL LOW (ref 0.60–1.00)
EGFR CKD-EPI AA FEMALE: >90 mL/min/{1.73_m2} (ref >=60)
EGFR CKD-EPI NON-AA FEMALE: >90 mL/min/{1.73_m2} (ref >=60)
GLUCOSE RANDOM: 77 mg/dL (ref 65–179)
POTASSIUM: 3.7 mmol/L (ref 3.5–5.0)
SODIUM: 134 mmol/L — ABNORMAL LOW (ref 135–145)

## 2018-07-17 LAB — MEAN CORPUSCULAR VOLUME: Lab: 97.6

## 2018-07-17 LAB — PHOSPHORUS: Phosphate:MCnc:Pt:Ser/Plas:Qn:: 4

## 2018-07-17 LAB — CO2: Carbon dioxide:SCnc:Pt:Ser/Plas:Qn:: 35 — ABNORMAL HIGH

## 2018-07-17 LAB — MAGNESIUM: Magnesium:MCnc:Pt:Ser/Plas:Qn:: 1.7

## 2018-07-17 NOTE — Unmapped (Signed)
Problem: Breathing Pattern Ineffective  Goal: Effective Breathing Pattern  Outcome: Progressing    Patient was compliant with all inhaled scheduled medications and was under no apparent distress.

## 2018-07-17 NOTE — Unmapped (Signed)
Pt is A&O x4. Corneals are intact and pupils are brisk. Full strengh in BUE and BLE. Pain controlled by PRN meds. Pt initially had 4L O2 nasal cannula at the beginning of shift and is now at 3L O2. Still continuing to wean down.    Problem: Adult Inpatient Plan of Care  Goal: Plan of Care Review  Outcome: Progressing  Goal: Patient-Specific Goal (Individualization)  Outcome: Progressing  Goal: Absence of Hospital-Acquired Illness or Injury  Outcome: Progressing  Goal: Optimal Comfort and Wellbeing  Outcome: Progressing  Goal: Readiness for Transition of Care  Outcome: Progressing  Goal: Rounds/Family Conference  Outcome: Progressing     Problem: Skin Injury Risk Increased  Goal: Skin Health and Integrity  Outcome: Progressing     Problem: Pain Chronic (Persistent)  Goal: Acceptable Pain Control and Functional Ability  Outcome: Progressing     Problem: Fall Injury Risk  Goal: Absence of Fall and Fall-Related Injury  Outcome: Progressing     Problem: Chronic Pain (Multiple Sclerosis)  Goal: Acceptable Pain Control  Outcome: Progressing     Problem: Fatigue (Multiple Sclerosis)  Goal: Optimal Energy for Daily Activity  Outcome: Progressing     Problem: Mobility Impairment (Multiple Sclerosis)  Goal: Safety with Mobility  Outcome: Progressing     Problem: Self-Care Deficit (Multiple Sclerosis)  Goal: Ability to Complete Activities of Daily Living  Outcome: Progressing     Problem: COPD Comorbidity  Goal: Maintenance of COPD Symptom Control  Outcome: Progressing     Problem: Hypertension Comorbidity  Goal: Blood Pressure in Desired Range  Outcome: Progressing     Problem: Pain Chronic (Persistent) (Comorbidity Management)  Goal: Acceptable Pain Control and Functional Ability  Outcome: Progressing     Problem: Breathing Pattern Ineffective  Goal: Effective Breathing Pattern  Outcome: Progressing

## 2018-07-17 NOTE — Unmapped (Signed)
Pt is A&O x4, strong BUE and BLE with some generalized weakness. Intact corneals and brisk pupils. SBP goal <180. Pain controlled with PRN meds. Pt still weaning down on O2, currently has 3LO2 nasal cannula. Will continue to monitor.     Problem: Adult Inpatient Plan of Care  Goal: Plan of Care Review  Outcome: Progressing  Goal: Patient-Specific Goal (Individualization)  Outcome: Progressing  Goal: Absence of Hospital-Acquired Illness or Injury  Outcome: Progressing  Goal: Optimal Comfort and Wellbeing  Outcome: Progressing  Goal: Readiness for Transition of Care  Outcome: Progressing  Goal: Rounds/Family Conference  Outcome: Progressing     Problem: Skin Injury Risk Increased  Goal: Skin Health and Integrity  Outcome: Progressing     Problem: Pain Chronic (Persistent)  Goal: Acceptable Pain Control and Functional Ability  Outcome: Progressing     Problem: Fall Injury Risk  Goal: Absence of Fall and Fall-Related Injury  Outcome: Progressing     Problem: Chronic Pain (Multiple Sclerosis)  Goal: Acceptable Pain Control  Outcome: Progressing     Problem: Fatigue (Multiple Sclerosis)  Goal: Optimal Energy for Daily Activity  Outcome: Progressing     Problem: Mobility Impairment (Multiple Sclerosis)  Goal: Safety with Mobility  Outcome: Progressing     Problem: Self-Care Deficit (Multiple Sclerosis)  Goal: Ability to Complete Activities of Daily Living  Outcome: Progressing     Problem: COPD Comorbidity  Goal: Maintenance of COPD Symptom Control  Outcome: Progressing     Problem: Hypertension Comorbidity  Goal: Blood Pressure in Desired Range  Outcome: Progressing     Problem: Pain Chronic (Persistent) (Comorbidity Management)  Goal: Acceptable Pain Control and Functional Ability  Outcome: Progressing     Problem: Breathing Pattern Ineffective  Goal: Effective Breathing Pattern  Outcome: Progressing

## 2018-07-17 NOTE — Unmapped (Signed)
Neurocritical Care   Progress Note     CODE STATUS:  FULL    Date of service: 07/17/2018  Hospital Day:  LOS: 8 days           Assessment and Plan     Jennifer Lewis is a 57 y.o. 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 The patient is admitted to NSICU following coil embolization of right ICA aneurysm.    24 Hour Events: Resumed 4 L [place] overnight. Otherwise no acute events.     Neuro:  Daily interruption of sedation: N/A  Multi-Modality Monitoring: none  Current anticonvulsants: not indicated.  Mobilty protocol current stage: 3  Splints/PRAFO:  No  Restraints:  Not indicated  CAM-ICU:  Negative    **Right ICA aneurysm s/p coil embolization  - clinically with intact neurologic exam  - continue ASA 81 mg daily  - SBP goal <180  - Sodium goal >135, checking daily  - PT/OT consults  - patient remains at risk for neurological decline due to post-op hemorrhage, cerebral edema, and seizures and requires close monitoring in the NSICU      **Depression  - Continue home Lexapro    **MS  - Continue home Tecfidera and PRN Baclofen  - Continue Tegretol for paryoxysmal sensory MS symptoms     **Pain/sedation  - tylenol prn mild pain  - oxycodone prn moderate pain   - pain well controlled today    Recent Labs   Lab Units 07/17/18  0410   SODIUM mmol/L 134*       Cardiovascular:  Admission weight: 72  Daily Weight: 75.9 kg (167 lb 5.3 oz)      Cardiac enzymes: No results in the last day      **Hemodynamic goals  - Current vasoactive medications:  None  - MAP > 65  - goal SBP < 180     **HTN  - Lisinopril 10 mg daily     **Hyperlipidemia  - Continue atorvastatin 20 mg daily (home med)    Heme  Current DVT prophylaxis: SCDs, lovenox 40 mg    **Anemia  - secondary to blood loss in the setting of retroperitoneal hematoma  - Hb stable at this time    **Right Retroperitoneal Hematoma  - in the setting of recent groin puncture for cerebral angiogram  - PVL groin pseudoaneurysm protocol found no vascular abnormality  - CTA abd/pelvis confirmed retroperitoneal and peritoneal hemorrhage of the right hemipelvis  - no additional intervention       Recent Labs   Lab Units 07/17/18  0410 07/16/18  0309 07/15/18  0342 07/14/18  0350   HEMOGLOBIN g/dL 8.8* 8.7* 8.6* 8.5*   PLATELET COUNT (1) 10*9/L 256 266 256 203     Lab Results   Component Value Date    INR 0.98 07/11/2018    APTT 30.8 07/11/2018       Pulmonary  The HOB is elevated to greater than 30 degrees.  O2/Vent Settings:   O2 Device: Nasal cannula  O2 Flow Rate (L/min):  [3 L/min-4 L/min] 4 L/min    CXR: no new imaging    **COPD Exacerbation  - completed 3 days of prednisone 40mg   - tiotropium inh daily   - advair inh PRN  - plan for PT/OT to assess for home oxygen  - patient will need outpatient follow up with pulmonology    **  Health Care Aquired Pneumonia  - LLL pneumonia on abdominal CT  - IS Q2 while awake  - Titrate O2 for sat > 88%  - [place] 4L      **Tobacco use  - smoking cessation counseling  - nicotine patch     GI  Weight: 75.9 kg (167 lb 5.3 oz)   Body mass index is 27.85 kg/m??.    GI prophylaxis: not indicated  The patient's last recorded bowel movement: 7/21  Bowel regimen: colace  Current nutritional source: Regular diet    **Nutrition  - regular diet    **Nausea  - zofran prn    Most recent LFTs:  Lab Results   Component Value Date    AST 33 01/02/2018    ALT 29 01/02/2018    ALKPHOS 106 01/02/2018    BILITOT 0.7 01/02/2018    PROT 7.7 01/02/2018    ALBUMIN 4.5 01/02/2018       Renal/Endocrine    Intake/Output Summary (Last 24 hours) at 07/17/2018 0717  Last data filed at 07/17/2018 0700  Gross per 24 hour   Intake 1540 ml   Output 1620 ml   Net -80 ml     The patient current insulin regimen for blood glucose control:  none  Foley catheter: No foley in place.     - check electrolytes daily   - continue to monitor I&O Recent Labs   Lab Units 07/17/18  0410 07/16/18  0309 07/15/18  0342 07/14/18  0350   CREATININE mg/dL 1.61* 0.96* 0.45* 4.09*     No results found for: A1C  Potassium   Date Value Ref Range Status   07/17/2018 3.7 3.5 - 5.0 mmol/L Final     Magnesium   Date Value Ref Range Status   07/17/2018 1.7 1.6 - 2.2 mg/dL Final     Phosphorus   Date Value Ref Range Status   07/17/2018 4.0 2.9 - 4.7 mg/dL Final         ID  Temp (24hrs), Avg:36.8 ??C (98.3 ??F), Min:36 ??C (96.8 ??F), Max:37.6 ??C (99.7 ??F)    Recent Labs   Lab Units 07/17/18  0410 07/16/18  0309 07/15/18  0342 07/14/18  0350   WBC 10*9/L 4.3* 5.2 6.3 5.1       **Health Care Aquired Pneumonia  - LLL Pneumonia on Abdominal CT  - Cefepime day #6/7  - cultures with inadequate specimen    **Immunosuppressed state  - continue home Acyclovir    Current Access:         -  PIV x 2       - Arterial line: No arterial line present.       - Central venous line: No central line present.     Current antibiotics:  ??? acyclovir (ZOVIRAX) tablet 400 mg Oral Nightly   ??? cefepime (MAXIPIME) 1 g in sodium chloride 0.9 % (NS) 100 mL IVPB-connector bag Intravenous Q6H     Cultures:  Blood Culture, Routine (no units)   Date Value   07/12/2018 No Growth at 5 days     Lower Respiratory Culture (no units)   Date Value   07/12/2018 Specimen Not Processed     WBC (10*9/L)   Date Value   07/17/2018 4.3 (L)        Last CSF Analysis: No results found for: COLORCSF, APPEARCSF, NUCCELLSCSF, RBCCSF, NCSF, LYMPHSCSF, MONOMACCSF, EOSCSF, OTHERCSF, SPUNA, PROTCSF, GLUCCSF    Tubes and drains:  Patient Lines/Drains/Airways Status  Active Active Lines, Drains, & Airways     Name:   Placement date:   Placement time:   Site:   Days:    Peripheral IV 12/28/17 Right Antecubital   12/28/17    0930    Antecubital   200    Peripheral IV 07/15/18 Right Antecubital   07/15/18    0200    Antecubital   2                         Objective Data        Temp:  [36 ??C (96.8 ??F)-37.6 ??C (99.7 ??F)] 36.7 ??C (98.1 ??F) Heart Rate:  [93-113] 104  SpO2 Pulse:  [93-113] 101  Resp:  [13-26] 19  BP: (118-157)/(39-76) 140/61  MAP (mmHg):  [65-99] 89  SpO2:  [90 %-95 %] 95 %    Intake/Output Summary (Last 24 hours) at 07/17/2018 0717  Last data filed at 07/17/2018 0700  Gross per 24 hour   Intake 1540 ml   Output 1620 ml   Net -80 ml     SpO2: 95 %  Height: 165.1 cm (5' 5)   Weight: 75.9 kg (167 lb 5.3 oz)   Body mass index is 27.85 kg/m??.         Hemodynamic/Invasive Device Data (24 hrs):       Ventilation/Oxygen Therapy (24hrs):  O2 Device: Nasal cannula  O2 Flow Rate (L/min):  [3 L/min-4 L/min] 4 L/min        Medications:  Scheduled medications:   ??? acyclovir  400 mg Oral Nightly   ??? aspirin  81 mg Oral Daily   ??? atorvastatin  20 mg Oral Nightly   ??? carBAMazepine  200 mg Oral BID   ??? Cefepime  1 g Intravenous Q6H   ??? cholecalciferol (vitamin D3)  1,000 Units Oral Daily   ??? dimethyl fumarate  240 mg Oral BID   ??? docusate sodium  100 mg Oral BID   ??? enoxaparin (LOVENOX) injection  40 mg Subcutaneous Q24H [facility]   ??? escitalopram oxalate  10 mg Oral Daily   ??? fluticasone propion-salmeterol  1 puff Inhalation BID   ??? lisinopril  10 mg Oral Daily   ??? nicotine  1 patch Transdermal Daily   ??? tiotropium  18 mcg Inhalation Daily     Continuous infusions:     PRN medications:   acetaminophen, albuterol, baclofen, hydrALAZINE, ondansetron, oxyCODONE                   Physical Exam     All vital signs for the past 24 hours have been reviewed.  All laboratory studies resulted in the past 24 hours have been reviewed.     General Exam:  General: Awake and alert.   Appears comfortable. No obvious distress.   ENT:  Mucous membranes moist. Oropharynx clear.  Cardiovascular:  Regular rate and rhythm.  No murmurs.   2+ radial pulses bilaterally.    Respiratory: Breathing is comfortable and unlabored.  Lungs are clear to ausculation bilaterally.    Gastrointestinal: soft mild diffuse tenderness  Extremities: Warm and well-perfused. No cyanosis, clubbing, or edema.  Skin: No obvious rashes or ecchymoses.    Neurological Exam:  Mental Status  LOC: awake and alert  Orientation: person, place and time  Neglect: not present  Speech: normal  Language: fluent with intact naming, repetition, comprehension    Cranial Nerves  Pupils: PERRL  Corneals: present bilaterally  Gaze: normal  Face Sensory: intact and symmetric  Face Motor: normal and symmetric  Cough: strong  Tongue: midline    Motor:   RUE: commands and 5/5  LUE: commands and 5/5  RLE: commands and 5/5  LLE: commands and 5/5     Sensory:    Sensory is intact to light touch    Glasgow Coma Score  Motor: obeys commands = 6  Verbal: oriented = 5  Eyes: eyes open spontaneously = 4        DISPOSITION     The patient requires admission to the [facility] for management of the above conditions.    Estimated Transfer Date:  07/16/18        BILLING     Hillery Hunter, MD  Neurocritical Care Fellow  07/17/18 7:53 AM

## 2018-07-17 NOTE — Unmapped (Signed)
Pt a&o x3, able to get up w/ one assist to use BSC. Admin prn oxycodone 10mg  for back pain w/ adequate effect. Continent of bladder. O2 sats drops to low 88% at time 3L O2 [place]. Increased O2 to 4L.  Will cont to monitor.  Problem: Adult Inpatient Plan of Care  Goal: Plan of Care Review  Outcome: Progressing  Goal: Patient-Specific Goal (Individualization)  Outcome: Progressing  Goal: Absence of Hospital-Acquired Illness or Injury  Outcome: Progressing  Goal: Optimal Comfort and Wellbeing  Outcome: Progressing  Goal: Readiness for Transition of Care  Outcome: Progressing  Goal: Rounds/Family Conference  Outcome: Progressing     Problem: Skin Injury Risk Increased  Goal: Skin Health and Integrity  Outcome: Progressing     Problem: Pain Chronic (Persistent)  Goal: Acceptable Pain Control and Functional Ability  Outcome: Progressing     Problem: Fall Injury Risk  Goal: Absence of Fall and Fall-Related Injury  Outcome: Progressing     Problem: Chronic Pain (Multiple Sclerosis)  Goal: Acceptable Pain Control  Outcome: Progressing     Problem: Fatigue (Multiple Sclerosis)  Goal: Optimal Energy for Daily Activity  Outcome: Progressing     Problem: Mobility Impairment (Multiple Sclerosis)  Goal: Safety with Mobility  Outcome: Progressing     Problem: Self-Care Deficit (Multiple Sclerosis)  Goal: Ability to Complete Activities of Daily Living  Outcome: Progressing     Problem: COPD Comorbidity  Goal: Maintenance of COPD Symptom Control  Outcome: Progressing     Problem: Hypertension Comorbidity  Goal: Blood Pressure in Desired Range  Outcome: Progressing     Problem: Pain Chronic (Persistent) (Comorbidity Management)  Goal: Acceptable Pain Control and Functional Ability  Outcome: Progressing     Problem: Breathing Pattern Ineffective  Goal: Effective Breathing Pattern  Outcome: Progressing

## 2018-07-17 NOTE — Unmapped (Signed)
NEUROSURGERY PROGRESS NOTE    Brief History of Present Illness  Jennifer Lewis is a 57 y.o. female with history of left ophthalmic aneurysm s/p pipeline embolization (04/25/2017) now s/p coil embolization of right cavernous ICA aneurysm (07/09/18).    Subjective/Interval History  Exam stable. No acute events    Interval Imaging Reviewed  No new neuro-imaging    Neurological Assessment and Plan  **Left ophthalmic aneurysm and right cavernous ICA aneurysm  PED 8 s/p coil embo of R cavernous ICA aneurysm (07/09/18)  - Na>135  - SBP<160  - Continue ASA  - COPD exacerbation, on room air  - Pneumonia, Cefepime d5/7  - Cultures NGTD   - f/u angiogram in 6 months (01/20)    Disposition: NSICU.    This patient is co-managed with the [facility]. The management of this patient was discussed in detail with them and we agree with their overall assessment and plan.    ___________________________________________________________________    Neurological Exam  EOSp  Ox3  PERRL  EOMI  F=  TML  RUE 5  LUE 5  RLE 5  LLE 5  No PND    The patient's vitals, intake/output, labs, orders, and relevant imaging were reviewed for the last 24 hours.    Problem List  Active Problems:    * No active hospital problems. *  Resolved Problems:    * No resolved hospital problems. *

## 2018-07-17 NOTE — Unmapped (Signed)
Pt remains in NSICU with orders for [facility]. She is on 4L [place] (weaned from [facility] prev shift). Metaneb and Q6 A& A d/c'd. Pt received Spiriva, but refused Advair stating she had already taken it early in AM. SpO2 improved following lasix today.

## 2018-07-18 LAB — BASIC METABOLIC PANEL
ANION GAP: 5 mmol/L — ABNORMAL LOW (ref 9–15)
BUN / CREAT RATIO: 29
CALCIUM: 9.2 mg/dL (ref 8.5–10.2)
CHLORIDE: 97 mmol/L — ABNORMAL LOW (ref 98–107)
CO2: 35 mmol/L — ABNORMAL HIGH (ref 22.0–30.0)
CREATININE: 0.49 mg/dL — ABNORMAL LOW (ref 0.60–1.00)
EGFR CKD-EPI AA FEMALE: >90 mL/min/{1.73_m2} (ref >=60)
EGFR CKD-EPI NON-AA FEMALE: >90 mL/min/{1.73_m2} (ref >=60)
GLUCOSE RANDOM: 78 mg/dL (ref 65–179)
POTASSIUM: 3.5 mmol/L (ref 3.5–5.0)
SODIUM: 137 mmol/L (ref 135–145)

## 2018-07-18 LAB — PHOSPHORUS: Phosphate:MCnc:Pt:Ser/Plas:Qn:: 3.4

## 2018-07-18 LAB — CBC
HEMOGLOBIN: 9.5 g/dL — ABNORMAL LOW (ref 12.0–16.0)
MEAN CORPUSCULAR HEMOGLOBIN CONC: 32.1 g/dL (ref 31.0–37.0)
MEAN CORPUSCULAR HEMOGLOBIN: 31.5 pg (ref 26.0–34.0)
MEAN CORPUSCULAR VOLUME: 98.1 fL (ref 80.0–100.0)
MEAN PLATELET VOLUME: 7.4 fL (ref 7.0–10.0)
PLATELET COUNT: 289 10*9/L (ref 150–440)
RED BLOOD CELL COUNT: 3.01 10*12/L — ABNORMAL LOW (ref 4.00–5.20)
RED CELL DISTRIBUTION WIDTH: 14.8 % (ref 12.0–15.0)

## 2018-07-18 LAB — POTASSIUM: Potassium:SCnc:Pt:Ser/Plas:Qn:: 3.5

## 2018-07-18 LAB — MAGNESIUM
MAGNESIUM: 1.7 mg/dL (ref 1.6–2.2)
Magnesium:MCnc:Pt:Ser/Plas:Qn:: 1.7

## 2018-07-18 LAB — HEMOGLOBIN: Lab: 9.5 — ABNORMAL LOW

## 2018-07-18 MED ORDER — OXYCODONE 10 MG TABLET
ORAL_TABLET | ORAL | 0 refills | 0.00000 days | Status: CP | PRN
Start: 2018-07-18 — End: 2018-07-25

## 2018-07-18 NOTE — Unmapped (Signed)
Pat remains afebrile and VS within normal limits. Pt remains on 2.5 l [place] and deep breathing exercises along IS machine explained and done. No c/o of shortness of breath. SCDs to lower legs, denies pain in the upper/lower extremity, no swelling in legs/arms. Bed in low position, call bell in reach, side rails up x 3 and bed alarm on. No injuries or fall reported to this time. Jennifer Lewis has mild distended abd with discomfort and is aware to call for prn pain meds and will continue to reassess pain and effectiveness of the medication.Jennifer Lewis remains alert and able to follow commands. Will continue to monitor.

## 2018-07-18 NOTE — Unmapped (Signed)
Please schedule this patient for an appointment as noted below.  Please contact the patient as needed/appropriate to alert them of this appointment.    Provider: Haynes Dage, MD,     Date or Time Frame: 6 months angio     Reason for Visit: Repeat angio    Imaging Needed: Angiogram    Reason for Imaging: s/p coil embo of right cavernous ICA aneurysm

## 2018-07-18 NOTE — Unmapped (Signed)
NEUROSURGERY DISCHARGE SUMMARY    Identifying Information:   Jennifer Lewis  [birth year]  [id number]    Admit date: 07/09/2018    Discharge date: 07/18/2018     Discharge Service: Neurosurgery [facility])    Discharge Attending Physician: Edison Simon, MD    Discharge to: Home with Home Health and/or PT/OT    Discharge Diagnoses:  Principal Problem:    Aneurysm (CMS-HCC)  Resolved Problems:    * No resolved hospital problems. [facility] Course:   Jennifer Lewis is a 57 y.o. female with a history of left ophthalmic aneurysm s/p pipeline embolization (04/25/17) and COPD who was admitted for coil embolization of an ICA aneurysm. They were seen and consented for surgical intervention after discussing the risks, benefits, and alternatives in full detail.    The patient was taken to the OR on 07/09/18 for a coil embolization of a right cavernous ICA aneurysm.  She tolerated the procedure well, was extubated in the OR, and was taken to the neuro ICU for further neuromonitoring. Her postoperative course was complicated by a retroperitoneal and peritoneal bleed, identified on CT abdomen. Vascular surgery did not feel that acute intervention was indicated. She also developed pneumonia that was managed with IV antibiotics and was treated for COPD exacerbation. She gradually improved and was transferred to the floor, however, she continued to have an O2 requirement and will bed discharge on O2 for home. Her diet was slowly advanced and at the time of discharge she was tolerating a regular diet. The patient was able to void spontaneously, have her pain controlled with P.O. pain medication, and returned to the preoperative ambulatory status. She was evaluated by Physical and Occupational Therapy and found to have 3x weekly needs at home.  She will be discharged home on POD9 in stable condition.      Follow-up:  1. 6 month follow up for repeat angiogram  2. Follow up with PCP to discuss hospitalizatioin  3. Follow up with pulmonology to discuss oxygen requirement    Procedures:   1. Coil embolization of right cavernous ICA aneurysm    Pathology:   None    Discharge Day Services:   The patient was seen and evaluated by the Neurosurgery team on the day of discharge and deemed stable for discharge, including stable labs, vital signs, radiographic studies, and neurologic exam.  Discharge instructions were given and explained.  Questions were answered.    Physical Exam:  General: No acute distress  Cardiovascular: Hemodynamically stable  Pulmonary: Breathing is unlabored  Neurologic:   Alert and oriented x3  PERRL  EOMI  Face symmetric  TML  5/5 strength througout  No PND  Abdominal bruising with tenderness to palpation    _____________________________________________________________________________    Temp:  [37.1 ??C-37.6 ??C] 37.6 ??C  Heart Rate:  [101-112] 112  Resp:  [18-21] 21  BP: (127-180)/(60-82) 180/82  MAP (mmHg):  [86-118] 118  SpO2:  [92 %-100 %] 93 %      Condition at Discharge:   Stable  _____________________________________________________________________________  Discharge Medications:     Your Medication List      START taking these medications    lisinopril 10 MG tablet  Commonly known as:  PRINIVIL,ZESTRIL  Take 1 tablet (10 mg total) by mouth daily.  Start taking on:  July 19, 2018     oxyCODONE 10 mg immediate release tablet  Commonly known as:  ROXICODONE  Take 1 tablet (10 mg total)  by mouth every four (4) hours as needed. for up to 7 days        CHANGE how you take these medications    TECFIDERA 240 mg Cpdr  Generic drug:  dimethyl fumarate  TAKE 1 CAPSULE BY MOUTH TWICE DAILY WITH MEALS  What changed:  See the new instructions.        CONTINUE taking these medications    acyclovir 400 MG tablet  Commonly known as:  ZOVIRAX  Take 400 mg by mouth nightly.     aspirin 81 MG chewable tablet  Chew 1 tablet (81 mg total) daily.     baclofen 10 MG tablet  Commonly known as:  LIORESAL  Take 1 tablet (10 mg total) by mouth Three (3) times a day as needed for muscle spasms.     carBAMazepine 200 mg tablet  Commonly known as:  TEGretol  Take 200 mg by mouth Two (2) times a day.     CENTRUM SILVER ULTRA WOMEN'S ORAL  Take 1 tablet by mouth daily.     escitalopram oxalate 10 MG tablet  Commonly known as:  LEXAPRO  Take 10 mg by mouth daily.     fluticasone propion-salmeterol 100-50 mcg/dose diskus  Commonly known as:  ADVAIR  Inhale 1 puff Two (2) times a day.     ondansetron 4 MG disintegrating tablet  Commonly known as:  ZOFRAN-ODT  Take 4 mg by mouth every six (6) hours as needed for nausea.     PROAIR RESPICLICK 90 mcg/actuation Aepb  Generic drug:  albuterol sulfate  Inhale 180 mcg Every four (4) hours.     rosuvastatin 10 MG tablet  Commonly known as:  CRESTOR  Take 10 mg by mouth daily.     VITAMIN D3 1,000 unit capsule  Generic drug:  cholecalciferol (vitamin D3)  Take 1,000 Units by mouth daily.          _____________________________________________________________________________  Pending Test Results (if blank, then none):   Order Current Status    Type and Screen Collected (07/10/18 1635)          Most Recent Labs:  Microbiology Results (last day)     ** No results found for the last 24 hours. **          Lab Results   Component Value Date    WBC 5.2 07/18/2018    HGB 9.5 (L) 07/18/2018    HCT 29.6 (L) 07/18/2018    PLT 289 07/18/2018       Lab Results   Component Value Date    NA 137 07/18/2018    K 3.5 07/18/2018    CL 97 (L) 07/18/2018    CO2 35.0 (H) 07/18/2018    BUN 14 07/18/2018    CREATININE 0.49 (L) 07/18/2018    CALCIUM 9.2 07/18/2018    MG 1.7 07/18/2018    PHOS 3.4 07/18/2018       _____________________________________________________________________________  Discharge Instructions:   Activity Instructions     Discharge activity      Limit activity:   Rest for the remainder of the day of your procedure. Keep your arm or leg straight as much as possible. If the angiogram catheter was put in your leg, do not use stairs for a few days after your angiogram. When you must use stairs, step up with the leg that was not used for the angiogram. Straighten this leg to move the other leg up to the  next step without putting stress on  it. You [place] be told to avoid lifting more than 15 pounds for 5 days after your procedure.    Additional Activity Instructions:                          Diet Instructions     Discharge Nutrition Therapy      Discharge Nutrition Therapy:  General    Please continue a regular diet with no restrictions.               Other Instructions     Discharge call MD      Seek care immediately or call 911 if:  -The leg or arm used for your angiogram is numb, painful, or changes color.  -The bruise at your catheter site gets bigger or becomes swollen.  -Your catheter site bleeds.  -You have weakness in an arm or leg.  -You become confused or have difficulty speaking.  -You have dizziness, a severe headache, or vision loss.    Important Numbers:  -Neurosurgery resident on-call 24/7: Call [phone] for the [facility] hospital operator. Ask to speak with the ???Neurosurgery resident on-call.???  -Neurosurgery Clinic: [phone] M-F 8am-5pm  -Spine Center: [phone] M-F 8am-5pm  -Neurosurgery Office: [phone] M-F 8am-5pm         Discharge dressing      Keep your wound clean and dry:   You are allowed to bathe. Please carefully wash the area with soap and water. Dry the area and put on new, clean bandages as directed. Change your bandages if they get wet or dirty.    Watch for bleeding and bruising:  It is normal to have a bruise and soreness where the angiogram catheter went in. Contact your cardiologist if your bruise gets larger. If your wound bleeds, use your hand to put pressure on the bandage. If you do not have a bandage, use a clean cloth to put pressure over and just above the puncture site. Seek care immediately.               Follow Up instructions and Outpatient Referrals     Ambulatory referral to Home Health      Disciplines requested:   Nursing  Occupational Therapy  Physical Therapy       Nursing requested:  Other: (please enter in comments)    Physical Therapy requested:  Evaluate and treat    Occupational Therapy Requested:  Evaluate and treat    Physician to follow patient's care:  PCP    Requested start of care date:  Routine (within 48 hours)    HH RN to provide:  1. VS and O2 saturation assessment  2. Reconcile discharge/home medications  3. Teach and reinforce medication management    I certify that Kjerstin D Nesbitt is confined to his/her home and needs intermittent skilled nursing care, physical therapy and/or speech therapy or continues to need occupational therapy. The patient is under my care, and I have authorized services on this plan of care and will periodically review the plan. The patient had a face-to-face encounter with an allowed provider type on 07/18/18 and the encounter was related to the primary reason for home health care.         Discharge instructions      Pain:   You have been prescribed a narcotic pain medication. Do not drive or make critical/important decisions while taking this medication. Narcotic medications [place] cause constipation. Ensure you have adequate (>25  grams/day) of fiber in your diet and drink at least 64 oz. of water daily. You Penner also wish to take an over the counter stool softener once or twice daily as needed for constipation while taking narcotic pain medication.         Discharge instructions      You have an appointment in 6 months to follow up with Dr. Eston Esters for Angiogram. If you do not receive an appointment time within 1 week of discharge, please call the neurosurgery clinic at the number listed above.    Please follow up with your primary care doctor regarding your hospitalization    Please follow up with your pulmonologist regarding your O2 requirement.                   Resources and Referrals     Oxygen      Oxygen Type:  Continuous    % saturation on room air at rest:  86 Comment - See below    Provide Continous Oxygen at (LPM):  4    Oxygen device delivery method:  Nasal Cannula    Type of portable O2 tank and concentrator to deliver:  Gaseous    Other Orders:  N/A    Length of Need:  99 Months    Qualifying SAT levels must be within 48 hours of discharge    Patient's sats dropped to 88% on 3L.     Additional Information:  Please deliver tanks to the hospital room.

## 2018-07-18 NOTE — Unmapped (Signed)
[facility] at [phone]  [facility] RN, PT, OT

## 2018-07-18 NOTE — Unmapped (Signed)
Additional order for OT consult received. Pt has now been discharged from acute OT.

## 2018-07-18 NOTE — Unmapped (Signed)
Patient transferred to unit this afternoon. Family at bedside. Fall precautions maintained. Stand-by assist to bathroom. Patient is on 3L of o2 to keep O2 sats >88% per order. Pain managed with PRNs. Patient remains free from falls and injury, will continue to monitor.   Problem: Adult Inpatient Plan of Care  Goal: Plan of Care Review  Outcome: Progressing  Goal: Patient-Specific Goal (Individualization)  Outcome: Progressing  Goal: Absence of Hospital-Acquired Illness or Injury  Outcome: Progressing  Goal: Optimal Comfort and Wellbeing  Outcome: Progressing  Goal: Readiness for Transition of Care  Outcome: Progressing  Goal: Rounds/Family Conference  Outcome: Progressing     Problem: Skin Injury Risk Increased  Goal: Skin Health and Integrity  Outcome: Progressing     Problem: Pain Chronic (Persistent)  Goal: Acceptable Pain Control and Functional Ability  Outcome: Progressing     Problem: Fall Injury Risk  Goal: Absence of Fall and Fall-Related Injury  Outcome: Progressing     Problem: Chronic Pain (Multiple Sclerosis)  Goal: Acceptable Pain Control  Outcome: Progressing     Problem: Fatigue (Multiple Sclerosis)  Goal: Optimal Energy for Daily Activity  Outcome: Progressing     Problem: Mobility Impairment (Multiple Sclerosis)  Goal: Safety with Mobility  Outcome: Progressing     Problem: Self-Care Deficit (Multiple Sclerosis)  Goal: Ability to Complete Activities of Daily Living  Outcome: Progressing     Problem: COPD Comorbidity  Goal: Maintenance of COPD Symptom Control  Outcome: Progressing     Problem: Hypertension Comorbidity  Goal: Blood Pressure in Desired Range  Outcome: Progressing     Problem: Pain Chronic (Persistent) (Comorbidity Management)  Goal: Acceptable Pain Control and Functional Ability  Outcome: Progressing     Problem: Breathing Pattern Ineffective  Goal: Effective Breathing Pattern  Outcome: Progressing

## 2018-07-18 NOTE — Unmapped (Signed)
NEUROSURGERY PROGRESS NOTE    Brief History of Present Illness  Jennifer Lewis is a 57 y.o. female with history of left ophthalmic aneurysm s/p pipeline embolization (04/25/2017) now s/p coil embolization of right cavernous ICA aneurysm (07/09/18).    Subjective/Interval History  Exam stable. No acute events    Interval Imaging Reviewed  No new neuro-imaging    Neurological Assessment and Plan  **Left ophthalmic aneurysm and right cavernous ICA aneurysm  PED 9 s/p coil embo of R cavernous ICA aneurysm (07/09/18)  - Na>135  - SBP<160  - Continue ASA  - COPD exacerbation, on room air  - Pneumonia, Cefepime d6/7  - Cultures NGTD   - f/u angiogram in 6 months (01/20)  - Needs home O2    Daily Maintenance:  VTE chemoprophylaxis: Lovenox  IV Fluids: None  Foley status: None  Nutrition: Regular    Non-neurologic problems being addressed  **Hospital-acquired PNA w acute hypoxia.   -Continue cefepime (started 7/19) through 7/26 AM to complete 7d course.  ??  *COPD.  Acute exacerbation COPD resolved following 3d of prednisone.  -Ongoing tobacco use.  -Continue home fluticasone-salmeterol, tiotropium.  -Wean O2 to keep O2 sats 88% or better.    Disposition: Floor, possible d/c but needs home O2  ___________________________________________________________________    Neurological Exam  EOSp  Ox3  PERRL  EOMI  F=  TML  RUE 5  LUE 5  RLE 5  LLE 5  No PND    The patient's vitals, intake/output, labs, orders, and relevant imaging were reviewed for the last 24 hours.    Problem List  Principal Problem:    Aneurysm (CMS-HCC)  Resolved Problems:    * No resolved hospital problems. *

## 2018-07-18 NOTE — Unmapped (Signed)
Oxycodone 10 mg,  6 tabs qd exceeds the daily initial limit.  Please clarify.  Spoke with [facility] Pharmacist who will dispense as regulated.

## 2018-07-19 ENCOUNTER — Other Ambulatory Visit: Payer: Self-pay

## 2018-07-19 DIAGNOSIS — J449 Chronic obstructive pulmonary disease, unspecified: Secondary | ICD-10-CM

## 2018-07-19 MED ORDER — LISINOPRIL 10 MG TABLET
ORAL_TABLET | Freq: Every day | ORAL | 0 refills | 0.00000 days | Status: CP
Start: 2018-07-19 — End: 2019-02-26

## 2018-07-19 MED ORDER — FLUTICASONE-SALMETEROL 100-50 MCG/DOSE IN AEPB: 1.0000 | INHALATION_SPRAY | Freq: Two times a day (BID) | RESPIRATORY_TRACT | 3 refills | Status: DC | PRN

## 2018-07-22 ENCOUNTER — Emergency Department: Payer: Medicare Other

## 2018-07-22 ENCOUNTER — Other Ambulatory Visit: Payer: Self-pay

## 2018-07-22 ENCOUNTER — Encounter: Payer: Self-pay | Admitting: Emergency Medicine

## 2018-07-22 ENCOUNTER — Emergency Department
Admission: EM | Admit: 2018-07-22 | Discharge: 2018-07-22 | Disposition: A | Discharge status: Home / Self Care | Payer: Medicare Other | Attending: Student in an Organized Health Care Education/Training Program | Admitting: Student in an Organized Health Care Education/Training Program

## 2018-07-22 DIAGNOSIS — Z7982 Long term (current) use of aspirin: Secondary | ICD-10-CM | POA: Diagnosis not present

## 2018-07-22 DIAGNOSIS — R600 Localized edema: Secondary | ICD-10-CM | POA: Insufficient documentation

## 2018-07-22 DIAGNOSIS — F17200 Nicotine dependence, unspecified, uncomplicated: Secondary | ICD-10-CM | POA: Diagnosis not present

## 2018-07-22 DIAGNOSIS — M79602 Pain in left arm: Secondary | ICD-10-CM | POA: Insufficient documentation

## 2018-07-22 DIAGNOSIS — J449 Chronic obstructive pulmonary disease, unspecified: Secondary | ICD-10-CM | POA: Diagnosis not present

## 2018-07-22 DIAGNOSIS — Z79899 Other long term (current) drug therapy: Secondary | ICD-10-CM | POA: Insufficient documentation

## 2018-07-22 DIAGNOSIS — E039 Hypothyroidism, unspecified: Secondary | ICD-10-CM | POA: Insufficient documentation

## 2018-07-22 DIAGNOSIS — G35 Multiple sclerosis: Secondary | ICD-10-CM | POA: Insufficient documentation

## 2018-07-22 DIAGNOSIS — R2243 Localized swelling, mass and lump, lower limb, bilateral: Secondary | ICD-10-CM | POA: Diagnosis present

## 2018-07-22 DIAGNOSIS — R609 Edema, unspecified: Secondary | ICD-10-CM

## 2018-07-22 LAB — COMPREHENSIVE METABOLIC PANEL
ALK PHOS: 76 U/L (ref 38–126)
ALT: 19 U/L (ref 0–44)
AST: 30 U/L (ref 15–41)
Albumin: 3.8 g/dL (ref 3.5–5.0)
Anion gap: 9 (ref 5–15)
BUN: 11 mg/dL (ref 6–20)
CALCIUM: 8.9 mg/dL (ref 8.9–10.3)
CHLORIDE: 102 mmol/L (ref 98–111)
CO2: 30 mmol/L (ref 22–32)
CREATININE: 0.58 mg/dL (ref 0.44–1.00)
GFR calc Af Amer: >60 mL/min (ref >60)
GFR calc non Af Amer: >60 mL/min (ref >60)
Glucose, Bld: 81 mg/dL (ref 70–99)
Potassium: 3.1 mmol/L — ABNORMAL LOW (ref 3.5–5.1)
Sodium: 141 mmol/L (ref 135–145)
Total Bilirubin: 0.8 mg/dL (ref 0.3–1.2)
Total Protein: 6.6 g/dL (ref 6.5–8.1)

## 2018-07-22 LAB — CBC WITH DIFFERENTIAL/PLATELET
BASOS ABS: 0 10*3/uL (ref 0–0.1)
Basophils Relative: 1 %
EOS PCT: 2 %
Eosinophils Absolute: 0.1 10*3/uL (ref 0–0.7)
HCT: 30.1 % — ABNORMAL LOW (ref 35.0–47.0)
HEMOGLOBIN: 10.4 g/dL — AB (ref 12.0–16.0)
LYMPHS ABS: 0.4 10*3/uL — AB (ref 1.0–3.6)
Lymphocytes Relative: 6 %
MCH: 33.4 pg (ref 26.0–34.0)
MCHC: 34.4 g/dL (ref 32.0–36.0)
MCV: 97 fL (ref 80.0–100.0)
Monocytes Absolute: 0.6 10*3/uL (ref 0.2–0.9)
Monocytes Relative: 11 %
NEUTROS PCT: 80 %
Neutro Abs: 4.4 10*3/uL (ref 1.4–6.5)
PLATELETS: 240 10*3/uL (ref 150–440)
RBC: 3.1 MIL/uL — AB (ref 3.80–5.20)
RDW: 14.4 % (ref 11.5–14.5)
WBC: 5.5 10*3/uL (ref 3.6–11.0)

## 2018-07-22 LAB — TROPONIN I: Troponin I: <0.03 ng/mL (ref <0.03)

## 2018-07-22 MED ORDER — MORPHINE SULFATE (PF) 4 MG/ML IV SOLN: 4.0000 mg | INTRAVENOUS | Status: DC | PRN

## 2018-07-22 MED ORDER — ONDANSETRON HCL 4 MG/2ML IJ SOLN
4.0000 mg | Freq: Once | INTRAMUSCULAR | Status: AC
  Administered 2018-07-22: 4 mg via INTRAVENOUS
  Filled 2018-07-22: qty 2

## 2018-07-22 MED ORDER — HYDROCODONE-ACETAMINOPHEN 5-325 MG PO TABS: 1.0000 | ORAL_TABLET | ORAL | 0 refills | Status: DC | PRN

## 2018-07-22 MED ORDER — HYDROCODONE-ACETAMINOPHEN 5-325 MG PO TABS
1.0000 | ORAL_TABLET | Freq: Once | ORAL | Status: AC
  Administered 2018-07-22: 1 via ORAL
  Filled 2018-07-22: qty 1

## 2018-07-22 NOTE — ED Provider Notes (Signed)
[facility] Emergency Department Provider Note    First MD Initiated Contact with Patient 07/22/18 [phone]     (approximate)  I have reviewed the triage vital signs and the nursing notes.   HISTORY  Chief Complaint Leg Swelling and Arm Pain    HPI Debra Hogan is a 57 y.o. female history of COPD fibromyalgia IBS MS status post recent percutaneous cerebral aneurysm coiling at [facility] this past month presents the ER with chief complaint of lower extremity swelling as well as left upper extremity pain and swelling.  Is not on any blood thinners.  Denies any fevers.  No chest pain or shortness of breath.  Has had swelling in her legs before but none to this extent.  Swelling gets better after she keeps her arms and legs elevated.  Denies any orthopnea.  Denies any history of congestive heart failure.    Past Medical History:  Diagnosis Date  . Arthritis   . COPD (chronic obstructive pulmonary disease) (HCC)   . Depression   . Fibromyalgia   . Irritable bowel syndrome   . MS (multiple sclerosis) (HCC)    Followed by Dr. Sherryll Burger   Family History  Problem Relation Age of Onset  . Hypertension Mother   . Hyperlipidemia Mother   . Arthritis Mother   . Cancer Father        throat  . Heart disease Brother   . Bipolar disorder Son   . Schizophrenia Son   . Cancer Maternal Grandfather        liver  . Cancer Paternal Grandmother        liver   Past Surgical History:  Procedure Laterality Date  . ABDOMINAL HYSTERECTOMY     CERVIX intact  . CHOLECYSTECTOMY N/A 01/03/2016   Procedure: LAPAROSCOPIC CHOLECYSTECTOMY WITH INTRAOPERATIVE CHOLANGIOGRAM;  Surgeon: Lattie Haw, MD;  Location: [facility] ORS;  Service: General;  Laterality: N/A;  . ENDOSCOPIC RETROGRADE CHOLANGIOPANCREATOGRAPHY (ERCP) WITH PROPOFOL N/A 01/04/2016   Procedure: ENDOSCOPIC RETROGRADE CHOLANGIOPANCREATOGRAPHY (ERCP) WITH PROPOFOL;  Surgeon: Midge Minium, MD;  Location: [facility] ENDOSCOPY;  Service:  Endoscopy;  Laterality: N/A;  . FOOT SURGERY  2001  . LAPAROSCOPIC ENDOMETRIOSIS FULGURATION     Patient Active Problem List   Diagnosis Date Noted  . Atherosclerosis of aorta (HCC) 05/08/2018  . Elevated blood pressure reading 05/08/2018  . Rash 04/19/2018  . Soft tissue mass 07/17/2017  . Right arm pain 06/29/2017  . Palpitations 05/07/2017  . Cerebral aneurysm 05/01/2017  . HLD (hyperlipidemia) 04/05/2017  . Abdominal pain   . Multiple sclerosis (HCC)   . Esophageal reflux 10/21/2015  . COPD, moderate (HCC) 02/05/2015  . Generalized anxiety disorder 12/03/2014  . Routine physical examination 09/19/2013  . Lung nodule < 6cm on CT 05/22/2013  . Fibromyalgia 10/21/2012  . Genital herpes 10/21/2012  . Acute left-sided low back pain without sciatica 10/21/2012  . Hypothyroidism 10/21/2012      Prior to Admission medications   Medication Sig Start Date End Date Taking? Authorizing Provider  acyclovir (ZOVIRAX) 400 MG tablet Take 1 tablet (400 mg total) by mouth daily. Patient taking differently: Take 400 mg by mouth at bedtime.  03/29/18  Yes Allegra Grana, FNP  Albuterol Sulfate (PROAIR RESPICLICK) 108 (90 Base) MCG/ACT AEPB Inhale 90 mcg into the lungs every 6 (six) hours as needed (as needed for cough, wheezing). Patient taking differently: Inhale 180 mcg into the lungs every 6 (six) hours as needed (as needed for cough, wheezing).  04/05/18  Yes Allegra Grana, FNP  aspirin 81 MG chewable tablet Chew 81 mg by mouth daily.    Yes [provider]  baclofen (LIORESAL) 10 MG tablet Take 10 mg by mouth 3 (three) times daily as needed for muscle spasms.    Yes [provider]  carbamazepine (TEGRETOL) 200 MG tablet Take 200 mg by mouth 2 (two) times daily.   Yes [provider]  cholecalciferol (VITAMIN D) 1000 units tablet Take 1,000 Units by mouth daily.   Yes [provider]  Dimethyl Fumarate 240 MG CPDR Take 240 mg by mouth 2 (two)  times daily.   Yes [provider]  escitalopram (LEXAPRO) 10 MG tablet Take 1 tablet (10 mg total) by mouth daily. 06/19/18  Yes Allegra Grana, FNP  Fluticasone-Salmeterol (ADVAIR DISKUS) 100-50 MCG/DOSE AEPB Inhale 1 puff into the lungs 2 (two) times daily as needed (for shortness of breath). 07/19/18  Yes Arnett, Lyn Records, FNP  lisinopril (PRINIVIL,ZESTRIL) 10 MG tablet Take 10 mg by mouth daily.   Yes [provider]  Multiple Vitamins-Minerals (CENTRUM SILVER ULTRA WOMENS) TABS Take 1 tablet by mouth daily.   Yes [provider]  ondansetron (ZOFRAN-ODT) 4 MG disintegrating tablet Take 4 mg by mouth every 6 (six) hours as needed for nausea or vomiting.    Yes [provider]  Oxycodone HCl 10 MG TABS Take 10 mg by mouth every 4 (four) hours as needed (for pain). For up to 7 days (no more than 3 tablets per day)   Yes [provider]  rosuvastatin (CRESTOR) 10 MG tablet Take 1 tablet (10 mg total) by mouth daily. 05/08/18  Yes Allegra Grana, FNP  HYDROcodone-acetaminophen (NORCO) 5-325 MG tablet Take 1 tablet by mouth every 4 (four) hours as needed for moderate pain. 07/22/18   Willy Eddy, MD    Allergies Valium [diazepam]    Social History Social History   Tobacco Use  . Smoking status: Current Every Day Smoker    Packs/day: 1.00    Years: 43.00    Pack years: 43.00  . Smokeless tobacco: Never Used  Substance Use Topics  . Alcohol use: No    Alcohol/week: 0.0 oz  . Drug use: No    Review of Systems Patient denies headaches, rhinorrhea, blurry vision, numbness, shortness of breath, chest pain, edema, cough, abdominal pain, nausea, vomiting, diarrhea, dysuria, fevers, rashes or hallucinations unless otherwise stated above in HPI. ____________________________________________   PHYSICAL EXAM:  VITAL SIGNS: Vitals:   07/22/18 1230 07/22/18 1308  BP: (!) 117/57 (!) 105/54  Pulse:    Resp: 20 18  Temp:    SpO2:        Constitutional: Alert and oriented.  Eyes: Conjunctivae are normal.  Head: Atraumatic. Nose: No congestion/rhinnorhea. Mouth/Throat: Mucous membranes are moist.   Neck: No stridor. Painless ROM.  Cardiovascular: Normal rate, regular rhythm. Grossly normal heart sounds.  Good peripheral circulation. Respiratory: Normal respiratory effort.  No retractions. Lungs CTAB. Gastrointestinal: Soft and nontender. No distention. No abdominal bruits. No CVA tenderness. Genitourinary:  Musculoskeletal: No lower extremity tenderness, 1+ BLE edema.  1+ LUE edema, no overlying warmth or deformity.  2+ radial pulses  No joint effusions. Neurologic:  Normal speech and language. No gross focal neurologic deficits are appreciated. No facial droop Skin:  Skin is warm, dry and intact. No rash noted. Psychiatric: Mood and affect are normal. Speech and behavior are normal.  ____________________________________________   LABS (all labs ordered are  listed, but only abnormal results are displayed)  Results for orders placed or performed during the hospital encounter of 07/22/18 (from the past 24 hour(s))  Troponin I     Status: None   Collection Time: 07/22/18 10:17 AM  Result Value Ref Range   Troponin I <0.03 <0.03 ng/mL  CBC with Differential/Platelet     Status: Abnormal   Collection Time: 07/22/18 10:17 AM  Result Value Ref Range   WBC 5.5 3.6 - 11.0 K/uL   RBC 3.10 (L) 3.80 - 5.20 MIL/uL   Hemoglobin 10.4 (L) 12.0 - 16.0 g/dL   HCT 16.1 (L) 09.6 - 04.5 %   MCV 97.0 80.0 - 100.0 fL   MCH 33.4 26.0 - 34.0 pg   MCHC 34.4 32.0 - 36.0 g/dL   RDW 40.9 81.1 - 91.4 %   Platelets 240 150 - 440 K/uL   Neutrophils Relative % 80 %   Neutro Abs 4.4 1.4 - 6.5 K/uL   Lymphocytes Relative 6 %   Lymphs Abs 0.4 (L) 1.0 - 3.6 K/uL   Monocytes Relative 11 %   Monocytes Absolute 0.6 0.2 - 0.9 K/uL   Eosinophils Relative 2 %   Eosinophils Absolute 0.1 0 - 0.7 K/uL   Basophils Relative 1 %   Basophils  Absolute 0.0 0 - 0.1 K/uL  Comprehensive metabolic panel     Status: Abnormal   Collection Time: 07/22/18 10:17 AM  Result Value Ref Range   Sodium 141 135 - 145 mmol/L   Potassium 3.1 (L) 3.5 - 5.1 mmol/L   Chloride 102 98 - 111 mmol/L   CO2 30 22 - 32 mmol/L   Glucose, Bld 81 70 - 99 mg/dL   BUN 11 6 - 20 mg/dL   Creatinine, Ser 7.82 0.44 - 1.00 mg/dL   Calcium 8.9 8.9 - 95.6 mg/dL   Total Protein 6.6 6.5 - 8.1 g/dL   Albumin 3.8 3.5 - 5.0 g/dL   AST 30 15 - 41 U/L   ALT 19 0 - 44 U/L   Alkaline Phosphatase 76 38 - 126 U/L   Total Bilirubin 0.8 0.3 - 1.2 mg/dL   GFR calc non Af Amer >60 >60 mL/min   GFR calc Af Amer >60 >60 mL/min   Anion gap 9 5 - 15  Troponin I     Status: None   Collection Time: 07/22/18  1:12 PM  Result Value Ref Range   Troponin I <0.03 <0.03 ng/mL   ____________________________________________  EKG My review and personal interpretation at Time: 11:19   Indication: arm pain  Rate: 95  Rhythm: sinus Axis: normal Other: normal intervals, no stemi ____________________________________________  RADIOLOGY  I personally reviewed all radiographic images ordered to evaluate for the above acute complaints and reviewed radiology reports and findings.  These findings were personally discussed with the patient.  Please see medical record for radiology report. ____________________________________________   PROCEDURES  Procedure(s) performed:  Procedures    Critical Care performed: no ____________________________________________   INITIAL IMPRESSION / ASSESSMENT AND PLAN / ED COURSE  Pertinent labs & imaging results that were available during my care of the patient were reviewed by me and considered in my medical decision making (see chart for details).   DDX: Arthritis, fibromyalgia, ACS, dissection, muscular skeletal strain  Angenette D Nicasio is a 57 y.o. who presents to the ED with was as described above.  Patient well-appearing in no acute distress.   Ultrasound of left upper extremity will be ordered to  rule out DVT given recent hospitalization.  Blood work is reassuring.  EKG shows no evidence of ischemia.  No pain with passive or active range of motion of the left upper extremity after giving pain medication.  There is no effusions.  Is not clinically consistent with septic arthritis.  No trauma or falls to suggest fracture.  She is well perfused with equal pulses bilaterally.  Not clinically consistent with dissection.  The patient will be placed on continuous pulse oximetry and telemetry for monitoring.  Laboratory evaluation will be sent to evaluate for the above complaints.     Clinical Course as of Jul 22 1356  Mon Jul 22, 2018  1229 Patient reassessed.  Pain improved.  Patient moving arm freely.  No evidence of DVT.  No evidence of infectious process.  Will repeat troponin to evaluate for atypical chest pain.  Patient also asking about swelling in her legs.  There is no evidence of decompensated congestive heart failure.  States that swelling her legs been ongoing for several years.  States that she was on prednisone during recent hospitalization and did receive a fair amount of fluids.  States that the symptoms are sniffily improved.  Does have compression stockings at home but has not been wearing them.   [PR]    Clinical Course User Index [PR] Willy Eddy, MD     As part of my medical decision making, I reviewed the following data within the electronic MEDICAL RECORD NUMBER Nursing notes reviewed and incorporated, Labs reviewed, notes from prior ED visits.   ____________________________________________   FINAL CLINICAL IMPRESSION(S) / ED DIAGNOSES  Final diagnoses:  Left arm pain  Peripheral edema      NEW MEDICATIONS STARTED DURING THIS VISIT:  New Prescriptions   HYDROCODONE-ACETAMINOPHEN (NORCO) 5-325 MG TABLET    Take 1 tablet by mouth every 4 (four) hours as needed for moderate pain.     Note:  This document  was prepared using Dragon voice recognition software and Christopoulos include unintentional dictation errors.    Willy Eddy, MD 07/22/18 1357

## 2018-07-22 NOTE — ED Notes (Signed)
Pt encouraged to take deep breaths due to decrease in O2 sat - O2 sat improved to 92%

## 2018-07-22 NOTE — ED Notes (Signed)
Patient transported to Ultrasound 

## 2018-07-22 NOTE — ED Notes (Signed)
Pt went to [facility] on 16th and had brain aneurysm surgery (in hospital 10 days) - pt d/c 25th and started with left arm pain between wrist and elbow - the pain has increased in intensity and now extends above elbow - she is c/o bilat feet swelling with toes tingling off and on - pt reports decrease in feet swelling with elevation

## 2018-07-22 NOTE — ED Notes (Signed)
To Room 16, Rosey Bath RN aware.

## 2018-07-22 NOTE — ED Triage Notes (Signed)
Pt to ed with c/o bilat feet and leg swelling that started 2 days ago.  Reports pain and swelling in left arm extending from wrist up past elbow.  Pt recently d/c from [facility].

## 2018-07-22 NOTE — ED Notes (Signed)
Patient's discharge and follow up information reviewed with patient by ED nursing staff and patient given the opportunity to ask questions pertaining to ED visit and discharge plan of care. Patient advised that should symptoms not continue to improve, resolve entirely, or should new symptoms develop then a follow up visit with their PCP or a return visit to the ED [facility] be warranted. Patient verbalized consent and understanding of discharge plan of care including potential need for further evaluation. Patient discharged in stable condition per attending ED physician on duty.  

## 2018-07-22 NOTE — ED Notes (Signed)
First Nurse Note:  Patient here complaining of swelling in her feet and left arm pain.  Released from [facility] recently after having stent placed for brain aneurysm.

## 2018-07-22 NOTE — ED Notes (Signed)
Attempted IV x2 without success - will have another nurse attempt

## 2018-07-22 NOTE — ED Notes (Signed)
Blood drawn by Larene Pickett

## 2018-07-24 NOTE — Unmapped (Signed)
[facility] Refill Coordination Note    Specialty Medication(s) to be Shipped:   Neurology: Tecfidera 240mg      Jennifer Lewis, DOB: November 17, 1961  Phone: [phone] (home)   Shipping Address: [address]  [place] [address]    All above HIPAA information was verified with patient.     Completed refill call assessment today to schedule patient's medication shipment from the [facility] [phone]).       Specialty medication(s) and dose(s) confirmed: Regimen is correct and unchanged.   Changes to medications: Jennifer Lewis reports no changes reported at this time.  Changes to insurance: No  Questions for the pharmacist: No    The patient will receive an FSI print out for each medication shipped and additional FDA Medication Guides as required.  Patient education from [facility] or Robet Leu Duman also be included in the shipment.    DISEASE/MEDICATION-SPECIFIC INFORMATION        N/A    ADHERENCE     Medication Adherence    Patient reported X missed doses in the last month:  0  Adherence tools used:  patient uses a pill box to manage medications          MEDICARE PART B DOCUMENTATION     Not Applicable    SHIPPING     Shipping address confirmed in FSI.     Delivery Scheduled: Yes, Expected medication delivery date: 07/31/2018 via UPS or courier.     Jennifer Lewis   [facility] Specialty Technician

## 2018-07-30 MED FILL — TECFIDERA/240MG/CPDR: TECFIDERA/240MG/CPDR | 30 days supply | Qty: 60 | Fill #0

## 2018-08-05 NOTE — Unmapped (Signed)
Patient called on neurosurgery RN line re:  She had questions regarding her discharge.  She is on 4/L [place] and wanted to know how she could be weaned down.  I told her to call her [facility] nurse and they could measure her oxygen and then she could be in touch with her pulmonologist.  She wanted to know about the Philis Kendall she was ordered in the hospital and if she should still be taking it?  I explained since they did not prescribe it at discharge I felt she should not take it now and it was for the period of exacerbation.  I sent her a copy of her discharge note so we would know exactly what she needed to do.

## 2018-08-08 ENCOUNTER — Ambulatory Visit: Payer: Self-pay | Admitting: Cardiovascular Disease

## 2018-08-09 ENCOUNTER — Ambulatory Visit: Payer: Self-pay | Admitting: Cardiovascular Disease

## 2018-08-09 ENCOUNTER — Ambulatory Visit (INDEPENDENT_AMBULATORY_CARE_PROVIDER_SITE_OTHER): Payer: Medicare Other | Admitting: Family

## 2018-08-09 ENCOUNTER — Encounter: Payer: Self-pay | Admitting: Family

## 2018-08-09 VITALS — BP 130/84 | HR 88 | Temp 98.4°F | Resp 16 | Wt 160.0 lb

## 2018-08-09 DIAGNOSIS — M797 Fibromyalgia: Secondary | ICD-10-CM

## 2018-08-09 DIAGNOSIS — J449 Chronic obstructive pulmonary disease, unspecified: Secondary | ICD-10-CM | POA: Diagnosis not present

## 2018-08-09 DIAGNOSIS — F411 Generalized anxiety disorder: Secondary | ICD-10-CM

## 2018-08-09 DIAGNOSIS — G35 Multiple sclerosis: Secondary | ICD-10-CM

## 2018-08-09 DIAGNOSIS — E785 Hyperlipidemia, unspecified: Secondary | ICD-10-CM

## 2018-08-09 DIAGNOSIS — M7989 Other specified soft tissue disorders: Secondary | ICD-10-CM

## 2018-08-09 DIAGNOSIS — M799 Soft tissue disorder, unspecified: Secondary | ICD-10-CM

## 2018-08-09 DIAGNOSIS — M25532 Pain in left wrist: Secondary | ICD-10-CM

## 2018-08-09 DIAGNOSIS — E039 Hypothyroidism, unspecified: Secondary | ICD-10-CM

## 2018-08-09 LAB — CBC WITH DIFFERENTIAL/PLATELET
BASOS ABS: 0 10*3/uL (ref 0.0–0.1)
Basophils Relative: 0.6 % (ref 0.0–3.0)
Eosinophils Absolute: 0.1 10*3/uL (ref 0.0–0.7)
Eosinophils Relative: 3.3 % (ref 0.0–5.0)
HEMATOCRIT: 35.1 % — AB (ref 36.0–46.0)
Hemoglobin: 11.9 g/dL — ABNORMAL LOW (ref 12.0–15.0)
LYMPHS PCT: 9.5 % — AB (ref 12.0–46.0)
Lymphs Abs: 0.4 10*3/uL — ABNORMAL LOW (ref 0.7–4.0)
MCHC: 33.9 g/dL (ref 30.0–36.0)
MCV: 94.9 fl (ref 78.0–100.0)
MONOS PCT: 12.3 % — AB (ref 3.0–12.0)
Monocytes Absolute: 0.5 10*3/uL (ref 0.1–1.0)
NEUTROS PCT: 74.3 % (ref 43.0–77.0)
Neutro Abs: 3.2 10*3/uL (ref 1.4–7.7)
Platelets: 214 10*3/uL (ref 150.0–400.0)
RBC: 3.7 Mil/uL — AB (ref 3.87–5.11)
RDW: 13.8 % (ref 11.5–15.5)
WBC: 4.3 10*3/uL (ref 4.0–10.5)

## 2018-08-09 LAB — COMPREHENSIVE METABOLIC PANEL
ALK PHOS: 98 U/L (ref 39–117)
ALT: 13 U/L (ref 0–35)
AST: 19 U/L (ref 0–37)
Albumin: 4.1 g/dL (ref 3.5–5.2)
BILIRUBIN TOTAL: 0.3 mg/dL (ref 0.2–1.2)
BUN: 14 mg/dL (ref 6–23)
CALCIUM: 9.4 mg/dL (ref 8.4–10.5)
CO2: 34 mEq/L — ABNORMAL HIGH (ref 19–32)
Chloride: 106 mEq/L (ref 96–112)
Creatinine, Ser: 0.64 mg/dL (ref 0.40–1.20)
GFR: 100.97 mL/min (ref >60.00)
GLUCOSE: 100 mg/dL — AB (ref 70–99)
Potassium: 3.7 mEq/L (ref 3.5–5.1)
Sodium: 145 mEq/L (ref 135–145)
Total Protein: 7.1 g/dL (ref 6.0–8.3)

## 2018-08-09 LAB — TSH: TSH: 3.87 u[IU]/mL (ref 0.35–4.50)

## 2018-08-09 LAB — T3, FREE: T3 FREE: 4.1 pg/mL (ref 2.3–4.2)

## 2018-08-09 LAB — T4, FREE: Free T4: 0.65 ng/dL (ref 0.60–1.60)

## 2018-08-09 MED ORDER — ONDANSETRON 4 MG PO TBDP: 4.0000 mg | ORAL_TABLET | Freq: Four times a day (QID) | ORAL | 1 refills | Status: DC | PRN

## 2018-08-09 MED ORDER — DICLOFENAC SODIUM 1 % TD GEL
4.0000 g | Freq: Four times a day (QID) | TRANSDERMAL | 3 refills | Status: DC
Start: 2018-08-09 — End: 2022-12-11

## 2018-08-09 MED ORDER — ESCITALOPRAM OXALATE 20 MG PO TABS: 20.0000 mg | ORAL_TABLET | Freq: Every day | ORAL | 1 refills | Status: DC

## 2018-08-09 MED ORDER — BACLOFEN 10 MG PO TABS: 10.0000 mg | ORAL_TABLET | Freq: Three times a day (TID) | ORAL | 4 refills | Status: DC | PRN

## 2018-08-09 MED ORDER — TRAMADOL HCL 50 MG PO TABS: 50.0000 mg | ORAL_TABLET | Freq: Every day | ORAL | 1 refills | Status: DC | PRN

## 2018-08-09 NOTE — Patient Instructions (Addendum)
rememeber in 6 months you need repeat angiogram with Dr. Edmonia Lynch - Pernell Dupre  Starnes use tramadol sparingly for left arm pain and pain from multiple sclerosis. DO NOT TAKE WITH NARCOTICS ( OXYCODONE, HYDROCODONE).   Mcneal trial voltaren gel for left arm pain. Also try heat.   Today we discussed referrals, orders. Pulmonology, echocardiogram    I have placed these orders in the system for you.  Please be sure to give [place] a call if you have not heard from our office regarding scheduling a test or regarding referral in a timely manner.  It is very important that you let me know as soon as possible.

## 2018-08-09 NOTE — Progress Notes (Signed)
Subjective:    Patient ID: Debra Hogan, female    DOB: [birth year], 59 y.o.   MRN: [id number]  CC: Debra Hogan is a 57 y.o. female who presents today for follow up.   HPI: Anxiety- on lexapro, with some relief. Still feels anxious.  No si/hi.   HLD- compliant with crestor  HTN- compliant with medication. Denies exertional chest pain or pressure, numbness or tingling radiating to left arm or jaw, palpitations, dizziness, frequent headaches, changes in vision.   COPD- Wearing o2 4 L today. Wears all the time. Not using spiriva as never got the spriva capsules. Some wheezing. A little coughing. Using Advair.  Using albuterol PRN, 3 days per week.   Not sure if breathing  back to baseline. Good days and bad days , feels SOB with as little activty as taking shower, humid days, walking long distances.   Recent Peritoneal hematoma- no abdominal pain, abdominal distention.   Due for echocardiogram due to CT Chest; Seeing Arida in 09/2018  Continues to left wrist pain which radiates to left forearm. Worse with activity. No numbness or tingling. Feels weak. Tramadol helped. She thinks from her MS. Oxycodone had helped. No h/o seizures or alcohol use.   Right knot on right wrist, several months, bigger in size. No numbness or tingling. No pain with moving wrist. Pain when knot is pressed.   ED 07/22/2018 - left arm pain Normal CXR Negative DVT left upper arm CT chest 04/30/18- atherosclerosis, small pericardial effusion.   07/09/2018, discharged 07/18/18 [facility],  Dr Edmonia Lynch Pernell Dupre,  right pipeline emobolization ( left done 04/25/2017). Complicated by retroperitoneal and peritoneal bleed seen CT abdomen. No vascular intervention. Developed PNA. Developed 02 requirement. Needs 6 month follow up for repeat angiogram.  CTA Chest shows pleural effusions, left greater than right.  CT abdomen and pelvis- 07/11/18- unchanged hematoma.  4L O2 . Pulmonologist, spiriva  HISTORY:  Past Medical  History:  Diagnosis Date  . Arthritis   . COPD (chronic obstructive pulmonary disease) (HCC)   . Depression   . Fibromyalgia   . Irritable bowel syndrome   . MS (multiple sclerosis) (HCC)    Followed by Dr. Sherryll Burger   Past Surgical History:  Procedure Laterality Date  . ABDOMINAL HYSTERECTOMY     CERVIX intact  . CHOLECYSTECTOMY N/A 01/03/2016   Procedure: LAPAROSCOPIC CHOLECYSTECTOMY WITH INTRAOPERATIVE CHOLANGIOGRAM;  Surgeon: Lattie Haw, MD;  Location: [facility] ORS;  Service: General;  Laterality: N/A;  . ENDOSCOPIC RETROGRADE CHOLANGIOPANCREATOGRAPHY (ERCP) WITH PROPOFOL N/A 01/04/2016   Procedure: ENDOSCOPIC RETROGRADE CHOLANGIOPANCREATOGRAPHY (ERCP) WITH PROPOFOL;  Surgeon: Midge Minium, MD;  Location: [facility] ENDOSCOPY;  Service: Endoscopy;  Laterality: N/A;  . FOOT SURGERY  2001  . LAPAROSCOPIC ENDOMETRIOSIS FULGURATION     Family History  Problem Relation Age of Onset  . Hypertension Mother   . Hyperlipidemia Mother   . Arthritis Mother   . Cancer Father        throat  . Heart disease Brother   . Bipolar disorder Son   . Schizophrenia Son   . Cancer Maternal Grandfather        liver  . Cancer Paternal Grandmother        liver    Allergies: Valium [diazepam] Current Outpatient Medications on File Prior to Visit  Medication Sig Dispense Refill  . acyclovir (ZOVIRAX) 400 MG tablet Take 1 tablet (400 mg total) by mouth daily. (Patient taking differently: Take 400 mg by mouth at bedtime. ) 90  tablet 3  . Albuterol Sulfate (PROAIR RESPICLICK) 108 (90 Base) MCG/ACT AEPB Inhale 90 mcg into the lungs every 6 (six) hours as needed (as needed for cough, wheezing). (Patient taking differently: Inhale 180 mcg into the lungs every 6 (six) hours as needed (as needed for cough, wheezing). ) 1 each 2  . aspirin 81 MG chewable tablet Chew 81 mg by mouth daily.     . carbamazepine (TEGRETOL) 200 MG tablet Take 200 mg by mouth 2 (two) times daily.    . cholecalciferol (VITAMIN D) 1000 units  tablet Take 1,000 Units by mouth daily.    . Dimethyl Fumarate 240 MG CPDR Take 240 mg by mouth 2 (two) times daily.    . Fluticasone-Salmeterol (ADVAIR DISKUS) 100-50 MCG/DOSE AEPB Inhale 1 puff into the lungs 2 (two) times daily as needed (for shortness of breath). 60 each 3  . HYDROcodone-acetaminophen (NORCO) 5-325 MG tablet Take 1 tablet by mouth every 4 (four) hours as needed for moderate pain. 6 tablet 0  . lisinopril (PRINIVIL,ZESTRIL) 10 MG tablet Take 10 mg by mouth daily.    . Multiple Vitamins-Minerals (CENTRUM SILVER ULTRA WOMENS) TABS Take 1 tablet by mouth daily.    . rosuvastatin (CRESTOR) 10 MG tablet Take 1 tablet (10 mg total) by mouth daily. 90 tablet 3   No current facility-administered medications on file prior to visit.     Social History   Tobacco Use  . Smoking status: Current Every Day Smoker    Packs/day: 1.00    Years: 43.00    Pack years: 43.00  . Smokeless tobacco: Never Used  Substance Use Topics  . Alcohol use: No    Alcohol/week: 0.0 standard drinks  . Drug use: No    Review of Systems  Constitutional: Negative for chills and fever.  Respiratory: Positive for cough and shortness of breath.   Cardiovascular: Negative for chest pain and palpitations.  Gastrointestinal: Negative for abdominal distention, abdominal pain, nausea and vomiting.  Musculoskeletal: Positive for arthralgias (left wrist).  Neurological: Negative for numbness.  Psychiatric/Behavioral: Negative for suicidal ideas. The patient is nervous/anxious.       Objective:    BP 130/84 (BP Location: Left Arm, Patient Position: Sitting, Cuff Size: Normal)   Pulse 88   Temp 98.4 F (36.9 C) (Oral)   Resp 16   Wt 160 lb (72.6 kg)   SpO2 98% Comment: 4 liters O2  BMI 26.63 kg/m  BP Readings from Last 3 Encounters:  08/09/18 130/84  07/22/18 124/61  05/21/18 (!) 144/84   Wt Readings from Last 3 Encounters:  08/09/18 160 lb (72.6 kg)  07/22/18 165 lb (74.8 kg)  05/21/18 159 lb  6 oz (72.3 kg)    Physical Exam  Constitutional: She appears well-developed and well-nourished.  Eyes: Conjunctivae are normal.  Cardiovascular: Normal rate, regular rhythm, normal heart sounds and normal pulses.  Pulmonary/Chest: Effort normal and breath sounds normal. She has no wheezes. She has no rhonchi. She has no rales.  Musculoskeletal:       Left wrist: She exhibits normal range of motion, no tenderness, no bony tenderness, no swelling and no deformity.       Arms: Grip strength normal. Palpable radial pulses and sensation intact.  No pain or limited ROM with  okay sign. No pain with resisted wrist dorsiflexion.  < 1cm palpable mass right wrist. Non fluctuant. No  erythema, increased warmth   Neurological: She is alert.  Skin: Skin is warm and dry.  Psychiatric:  She has a normal mood and affect. Her speech is normal and behavior is normal. Thought content normal.  Vitals reviewed.      Assessment & Plan:   Problem List Items Addressed This Visit      Respiratory   COPD, moderate (HCC) - Primary    No acute respiratory distress.  SaO2 90% on 4 L oxygen.  Discussed with patient and reviewed hospital discharge, she was to see pulmonology.  I have placed referral for this today for discussion of O2 and if she is continue.       Relevant Orders   Ambulatory referral to Pulmonology   CBC with Differential/Platelet (Completed)   Comprehensive metabolic panel (Completed)     Endocrine   Hypothyroidism   Relevant Orders   TSH (Completed)   T4, free (Completed)   T3, free (Completed)     Nervous and Auditory   Multiple sclerosis (HCC)   Relevant Medications   ondansetron (ZOFRAN-ODT) 4 MG disintegrating tablet     Other   Fibromyalgia   Relevant Medications   baclofen (LIORESAL) 10 MG tablet   Generalized anxiety disorder    Minimal relief on lexapro. Trial increase of lexapro. Close follow up.       Relevant Medications   escitalopram (LEXAPRO) 20 MG tablet     HLD (hyperlipidemia)    Continue current regimen.       Soft tissue mass    Pending [place].       Relevant Orders   [place] MiscellaneoUS Localization   Left wrist pain    Benign exam. Have asked patient if she would like xrays. Will follow           I have discontinued Meloni D. Calamari's Oxycodone HCl. I have also changed her baclofen, escitalopram, and ondansetron. Additionally, I am having her start on diclofenac sodium and traMADol. Lastly, I am having her maintain her acyclovir, Albuterol Sulfate, rosuvastatin, aspirin, Fluticasone-Salmeterol, Dimethyl Fumarate, CENTRUM SILVER ULTRA WOMENS, lisinopril, carbamazepine, cholecalciferol, and HYDROcodone-acetaminophen.   Meds ordered this encounter  Medications  . baclofen (LIORESAL) 10 MG tablet    Sig: Take 1 tablet (10 mg total) by mouth 3 (three) times daily as needed for muscle spasms.    Dispense:  30 each    Refill:  4  . escitalopram (LEXAPRO) 20 MG tablet    Sig: Take 1 tablet (20 mg total) by mouth daily.    Dispense:  90 tablet    Refill:  1  . ondansetron (ZOFRAN-ODT) 4 MG disintegrating tablet    Sig: Take 1 tablet (4 mg total) by mouth every 6 (six) hours as needed for nausea or vomiting.    Dispense:  20 tablet    Refill:  1  . diclofenac sodium (VOLTAREN) 1 % GEL    Sig: Apply 4 g topically 4 (four) times daily.    Dispense:  1 Tube    Refill:  3    Order Specific Question:   Supervising Provider    Answer:   Darrick Huntsman, TERESA L [[id number]]  . traMADol (ULTRAM) 50 MG tablet    Sig: Take 1 tablet (50 mg total) by mouth daily as needed.    Dispense:  30 tablet    Refill:  1    Order Specific Question:   Supervising Provider    Answer:   Sherlene Shams [[id number]]    Return precautions given.   Risks, benefits, and alternatives of the medications and treatment plan prescribed today were discussed, and  patient expressed understanding.   Education regarding symptom management and diagnosis given to patient on  AVS.  Continue to follow with Allegra Grana, FNP for routine health maintenance.   Georgie D Schildt and I agreed with plan.   Rennie Plowman, FNP

## 2018-08-09 NOTE — Progress Notes (Signed)
Spoke with [facility] , [facility]. Patient denied OT on 07/24/18. They just need order faxed to them for PT/OT FAX [phone]

## 2018-08-11 ENCOUNTER — Encounter: Payer: Self-pay | Admitting: Family

## 2018-08-11 ENCOUNTER — Telehealth: Payer: Self-pay | Admitting: Family

## 2018-08-11 DIAGNOSIS — I10 Essential (primary) hypertension: Secondary | ICD-10-CM | POA: Insufficient documentation

## 2018-08-11 DIAGNOSIS — M25532 Pain in left wrist: Secondary | ICD-10-CM | POA: Insufficient documentation

## 2018-08-11 NOTE — Assessment & Plan Note (Signed)
New. Compliant with medication. At goal. No changes made.

## 2018-08-11 NOTE — Telephone Encounter (Signed)
Would you call pt and see if she needs our help getting OT back started again?   She also had trouble with PT .Debra Hogan   You have written orders from me to start both OT and PT if patient desires.   Thank you!

## 2018-08-11 NOTE — Assessment & Plan Note (Signed)
No acute respiratory distress.  SaO2 90% on 4 L oxygen.  Discussed with patient and reviewed hospital discharge, she was to see pulmonology.  I have placed referral for this today for discussion of O2 and if she is continue.

## 2018-08-11 NOTE — Assessment & Plan Note (Signed)
Continue current regimen

## 2018-08-11 NOTE — Assessment & Plan Note (Signed)
Pending US

## 2018-08-11 NOTE — Assessment & Plan Note (Signed)
Benign exam. Have asked patient if she would like xrays. Will follow

## 2018-08-11 NOTE — Assessment & Plan Note (Signed)
Minimal relief on lexapro. Trial increase of lexapro. Close follow up.

## 2018-08-11 NOTE — Telephone Encounter (Signed)
-----   Message from Alisia Ferrari, [place] sent at 08/09/2018  2:08 PM EDT -----   ----- Message ----- From: Allegra Grana, FNP Sent: 08/09/2018  12:02 PM EDT To: Alisia Ferrari, CMA  Call Ms Flowers with [facility] regarding patient care; patient reports only visit since dc from hospital on 07/18/18. Per paperwork, she was supposed to have PT and OT. Please see how we can facilitate.  [phone]

## 2018-08-12 NOTE — Progress Notes (Signed)
I do not see an active order for an echo-please enter order and I will call to get it scheduled for her. Thanks! Melissa

## 2018-08-13 ENCOUNTER — Telehealth: Payer: Self-pay | Admitting: Family

## 2018-08-13 DIAGNOSIS — M7989 Other specified soft tissue disorders: Secondary | ICD-10-CM

## 2018-08-13 NOTE — Telephone Encounter (Signed)
Left voice mail for patient to call back ok for [facility] to speak to patient , regarding below message    

## 2018-08-13 NOTE — Telephone Encounter (Signed)
Scheduled order for pt [place] will need to be redone before I can schedule please and Thank you! Dated on 08/09/2018.

## 2018-08-14 NOTE — Telephone Encounter (Signed)
Order placed

## 2018-08-14 NOTE — Telephone Encounter (Signed)
Corrected order

## 2018-08-15 NOTE — Progress Notes (Signed)
I do not see an order for echocardiogram?

## 2018-08-19 ENCOUNTER — Encounter: Payer: Self-pay | Admitting: Family

## 2018-08-19 ENCOUNTER — Other Ambulatory Visit: Payer: Self-pay | Admitting: Family

## 2018-08-19 ENCOUNTER — Telehealth: Payer: Self-pay | Admitting: Family

## 2018-08-19 DIAGNOSIS — I313 Pericardial effusion (noninflammatory): Secondary | ICD-10-CM

## 2018-08-19 DIAGNOSIS — I3139 Other pericardial effusion (noninflammatory): Secondary | ICD-10-CM

## 2018-08-19 NOTE — Progress Notes (Signed)
[facility] Pulmonary Medicine Consultation      Assessment and Plan:  Emphysema with dyspnea on exertion. -FEV1 equals 52% consistent with moderate to severe obstructive lung disease. - Emphysematous changes seen on low-dose CT chest. -Continue dyspnea likely multifactorial from COPD/emphysema, as well as debility/deconditioning associated with the patient's underlying multiple sclerosis. - We will maximize therapy by adding a LAMA to her Advair. - Discussed the importance of smoking cessation, and that her respiratory status will continue to decline.  Obstructive sleep apnea. - Given his CPAP about 8 years ago, stopped using after 3 months due to change of insurance.  He is not interested in restarting at this time as she "hated it".  Nicotine abuse. -Discussed importance of smoke cessation, spent 4 minutes of discussion.  Patient is interested in starting Chantix, she has quit with it before.  Prescription given.    Date: 08/20/2018  MRN# [id number] Debra Hogan 10/18/1961   Debra Hogan is a 57 y.o. old female seen in consultation for chief complaint of:    Chief Complaint  Patient presents with  . Consult    Referred by M. Arnett for eval of copd  . Cough    greyish brown  . Shortness of Breath    with activity  . Wheezing    at bedtime    HPI:  The patient is a 57 yo female with a history of Multiple sclerosis, she has issues with wheezing, dyspnea. She was admitted for an brain aneurysm and was discharged with oxygen at 4L 24/7.  She is on advair twice daily, and rinses mouth. She uses proair 3 or 4 days per week. Her breathing is worse with activity, smoke, humidity.  She used to be active with walking, she can do a flight of stair but has to stop in the middle. She can usually walk a supermarket but will take her time and take breaks.  She is smoking 1 ppd, she has quit for 5 years in the past with chantix, she restarted after her son moved back home, that happened  about 4 years ago, and she has been smoking since then.  She worked in Design[employer], fashion/clothing. She is sleepy during the day.  She denies reflux, has sinus drainage and takes flonase which helps.  She snores at night. She was diagnosed with OSA about 8 years ago, then she lost her insurance after 3 months and had to give it back. She "hated" it.   **Desat walk 08/20/18>> at rest on RA sat is 94% and HR is 90. She walked 180 feet at moderate pace without dyspnea. Her sat was 91% at the end.  **Spirometry 08/20/2018>> tracings personally reviewed, FVC is 69% predicted, FEV1 is 52% predicted, ratio is 58%. - Overall this test shows moderate to severe obstructive lung disease with mild restriction.  **Low-dose CT 04/30/2018>> apical emphysematous changes.  Per report there a few tiny sub-4 mm nodules in both lungs. **CO2 08/09/2018>> 34, slightly elevated **CBC 08/09/2018>> absolute eosinophil count 100    PMHX:   Past Medical History:  Diagnosis Date  . Arthritis   . COPD (chronic obstructive pulmonary disease) (HCC)   . Depression   . Fibromyalgia   . Irritable bowel syndrome   . MS (multiple sclerosis) (HCC)    Followed by Dr. Sherryll Burger   Surgical Hx:  Past Surgical History:  Procedure Laterality Date  . ABDOMINAL HYSTERECTOMY     CERVIX intact  . CHOLECYSTECTOMY N/A 01/03/2016   Procedure: LAPAROSCOPIC CHOLECYSTECTOMY  WITH INTRAOPERATIVE CHOLANGIOGRAM;  Surgeon: Lattie Haw, MD;  Location: [facility] ORS;  Service: General;  Laterality: N/A;  . ENDOSCOPIC RETROGRADE CHOLANGIOPANCREATOGRAPHY (ERCP) WITH PROPOFOL N/A 01/04/2016   Procedure: ENDOSCOPIC RETROGRADE CHOLANGIOPANCREATOGRAPHY (ERCP) WITH PROPOFOL;  Surgeon: Midge Minium, MD;  Location: [facility] ENDOSCOPY;  Service: Endoscopy;  Laterality: N/A;  . FOOT SURGERY  2001  . LAPAROSCOPIC ENDOMETRIOSIS FULGURATION     Family Hx:  Family History  Problem Relation Age of Onset  . Hypertension Mother   . Hyperlipidemia Mother   . Arthritis Mother   . Cancer  Father        throat  . Heart disease Brother   . Bipolar disorder Son   . Schizophrenia Son   . Cancer Maternal Grandfather        liver  . Cancer Paternal Grandmother        liver   Social Hx:   Social History   Tobacco Use  . Smoking status: Current Every Day Smoker    Packs/day: 1.00    Years: 43.00    Pack years: 43.00  . Smokeless tobacco: Never Used  Substance Use Topics  . Alcohol use: No    Alcohol/week: 0.0 standard drinks  . Drug use: No   Medication:    Current Outpatient Medications:  .  acyclovir (ZOVIRAX) 400 MG tablet, Take 1 tablet (400 mg total) by mouth daily. (Patient taking differently: Take 400 mg by mouth at bedtime. ), Disp: 90 tablet, Rfl: 3 .  Albuterol Sulfate (PROAIR RESPICLICK) 108 (90 Base) MCG/ACT AEPB, Inhale 90 mcg into the lungs every 6 (six) hours as needed (as needed for cough, wheezing). (Patient taking differently: Inhale 180 mcg into the lungs every 6 (six) hours as needed (as needed for cough, wheezing). ), Disp: 1 each, Rfl: 2 .  aspirin 81 MG chewable tablet, Chew 81 mg by mouth daily. , Disp: , Rfl:  .  baclofen (LIORESAL) 10 MG tablet, Take 1 tablet (10 mg total) by mouth 3 (three) times daily as needed for muscle spasms., Disp: 30 each, Rfl: 4 .  carbamazepine (TEGRETOL) 200 MG tablet, Take 200 mg by mouth 2 (two) times daily., Disp: , Rfl:  .  cholecalciferol (VITAMIN D) 1000 units tablet, Take 1,000 Units by mouth daily., Disp: , Rfl:  .  diclofenac sodium (VOLTAREN) 1 % GEL, Apply 4 g topically 4 (four) times daily., Disp: 1 Tube, Rfl: 3 .  Dimethyl Fumarate 240 MG CPDR, Take 240 mg by mouth 2 (two) times daily., Disp: , Rfl:  .  escitalopram (LEXAPRO) 20 MG tablet, Take 1 tablet (20 mg total) by mouth daily., Disp: 90 tablet, Rfl: 1 .  Fluticasone-Salmeterol (ADVAIR DISKUS) 100-50 MCG/DOSE AEPB, Inhale 1 puff into the lungs 2 (two) times daily as needed (for shortness of breath)., Disp: 60 each, Rfl: 3 .  HYDROcodone-acetaminophen  (NORCO) 5-325 MG tablet, Take 1 tablet by mouth every 4 (four) hours as needed for moderate pain., Disp: 6 tablet, Rfl: 0 .  lisinopril (PRINIVIL,ZESTRIL) 10 MG tablet, Take 10 mg by mouth daily., Disp: , Rfl:  .  Multiple Vitamins-Minerals (CENTRUM SILVER ULTRA WOMENS) TABS, Take 1 tablet by mouth daily., Disp: , Rfl:  .  ondansetron (ZOFRAN-ODT) 4 MG disintegrating tablet, Take 1 tablet (4 mg total) by mouth every 6 (six) hours as needed for nausea or vomiting., Disp: 20 tablet, Rfl: 1 .  rosuvastatin (CRESTOR) 10 MG tablet, Take 1 tablet (10 mg total) by mouth daily., Disp: 90 tablet, Rfl: 3 .  traMADol (ULTRAM) 50 MG tablet, Take 1 tablet (50 mg total) by mouth daily as needed., Disp: 30 tablet, Rfl: 1   Allergies:  Valium [diazepam]  Review of Systems: Gen:  Denies  fever, sweats, chills HEENT: Denies blurred vision, double vision. bleeds, sore throat Cvc:  No dizziness, chest pain. Resp:   Denies cough or sputum production, shortness of breath Gi: Denies swallowing difficulty, stomach pain. Gu:  Denies bladder incontinence, burning urine Ext:   No Joint pain, stiffness. Skin: No skin rash,  hives  Endoc:  No polyuria, polydipsia. Psych: No depression, insomnia. Other:  All other systems were reviewed with the patient and were negative other that what is mentioned in the HPI.   Physical Examination:   VS: BP 132/80 (BP Location: Left Arm, Cuff Size: Large)   Pulse 96   Resp 16   Ht 5\' 6"  (1.676 m)   Wt 156 lb (70.8 kg)   SpO2 96%   BMI 25.18 kg/m   General Appearance: No distress  Neuro:without focal findings,  speech normal,  HEENT: PERRLA, EOM intact.   Pulmonary: normal breath sounds, No wheezing.  CardiovascularNormal S1,S2.  No m/r/g.   Abdomen: Benign, Soft, non-tender. Renal:  No costovertebral tenderness  GU:  No performed at this time. Endoc: No evident thyromegaly, no signs of acromegaly. Skin:   warm, no rashes, no ecchymosis  Extremities: normal, no  cyanosis, clubbing.  Other findings:    LABORATORY PANEL:   CBC No results for input(s): WBC, HGB, HCT, PLT in the last 168 hours. ------------------------------------------------------------------------------------------------------------------  Chemistries  No results for input(s): NA, K, CL, CO2, GLUCOSE, BUN, CREATININE, CALCIUM, MG, AST, ALT, ALKPHOS, BILITOT in the last 168 hours.  Invalid input(s): GFRCGP ------------------------------------------------------------------------------------------------------------------  Cardiac Enzymes No results for input(s): TROPONINI in the last 168 hours. ------------------------------------------------------------  RADIOLOGY:  No results found.     Thank  you for the consultation and for allowing [facility] to assist in the care of your patient. Our recommendations are noted above.  Please contact us if we can be of further service.   Wells Guiles, M.D., F.C.C.P.  Board Certified in Internal Medicine, Pulmonary Medicine, Critical Care Medicine, and Sleep Medicine.  [facility] Office Number: [phone]   08/20/2018

## 2018-08-19 NOTE — Telephone Encounter (Signed)
Sent mychart

## 2018-08-20 ENCOUNTER — Ambulatory Visit: Payer: Medicare Other | Admitting: Internal Medicine

## 2018-08-20 ENCOUNTER — Encounter: Payer: Self-pay | Admitting: Internal Medicine

## 2018-08-20 VITALS — BP 132/80 | HR 96 | Resp 16 | Ht 66.0 in | Wt 156.0 lb

## 2018-08-20 DIAGNOSIS — F17210 Nicotine dependence, cigarettes, uncomplicated: Secondary | ICD-10-CM | POA: Diagnosis not present

## 2018-08-20 DIAGNOSIS — J449 Chronic obstructive pulmonary disease, unspecified: Secondary | ICD-10-CM

## 2018-08-20 DIAGNOSIS — R0602 Shortness of breath: Secondary | ICD-10-CM | POA: Diagnosis not present

## 2018-08-20 MED ORDER — TIOTROPIUM BROMIDE MONOHYDRATE 18 MCG IN CAPS: 18.0000 ug | ORAL_CAPSULE | Freq: Every day | RESPIRATORY_TRACT | 12 refills | Status: DC

## 2018-08-20 MED ORDER — VARENICLINE TARTRATE 0.5 MG X 11 & 1 MG X 42 PO MISC: ORAL | 0 refills | Status: DC

## 2018-08-20 NOTE — Patient Instructions (Addendum)
Will start spiriva.  Continue advair and albuterol.  Overnight oxymetry on 2L.   --Quitting smoking is the most important thing that you can do for your health.  --Quitting smoking will have greater affect on your health than any medicine that we can give you.   --The best way to quit is to set a quit date, usually a day that has meaning like someone's birthday, anniversary, or holiday.  --Start any medication prescribed for quitting one week before you quit date. Then toss out the cigarettes on your quit date.  --If you start smoking again, start from scratch--set another quit day and try again!

## 2018-08-21 NOTE — Unmapped (Addendum)
[facility] Refill Coordination Note  Specialty Medication(s): Dimethyl Fumarate 240mg  capsules  Additional Medications shipped: none    Jennifer Lewis, DOB: 1961-12-23  Phone: [phone] (home) , Alternate phone contact: N/A  Phone or address changes today?: No  All above HIPAA information was verified with patient.  Shipping Address: [address]  [place] [address]   Insurance changes? No    Completed refill call assessment today to schedule patient's medication shipment from the [facility] [phone]).      Confirmed the medication and dosage are correct and have not changed: Yes, regimen is correct and unchanged.    Confirmed patient started or stopped the following medications in the past month:  Yes. Jennifer Lewis reports starting the following medications: Chantix and Spiriva (per patient will start today - just has not picked them up at local pharmacy as of yet)    Are you tolerating your medication?:  Jennifer Lewis reports tolerating the medication.    ADHERENCE    Is this medicine transplant or covered by Medicare Part B? No.    Patient states she has 5 days supply on hand (10 capsules)    Did you miss any doses in the past 4 weeks? No missed doses reported.    FINANCIAL/SHIPPING    Delivery Scheduled: Yes, Expected medication delivery date: 08/23/18 [facility])     The patient will receive a drug information handout for each medication shipped and additional FDA Medication Guides as required.     Jennifer Lewis did not have any additional questions at this time.    Delivery address confirmed in Epic.    We will follow up with patient monthly for standard refill processing and delivery.      Thank you,  Mervyn Gay   [facility] Specialty Pharmacist

## 2018-08-22 ENCOUNTER — Ambulatory Visit: Payer: Medicare Other

## 2018-08-22 MED FILL — TECFIDERA 240 MG CAPSULE,DELAYED RELEASE: 30 days supply | Qty: 60 | Fill #0 | Status: AC

## 2018-08-28 ENCOUNTER — Telehealth: Payer: Self-pay

## 2018-08-28 ENCOUNTER — Ambulatory Visit
Admission: RE | Admit: 2018-08-28 | Discharge: 2018-08-28 | Disposition: A | Discharge status: Home / Self Care | Payer: Medicare Other | Source: Ambulatory Visit | Attending: Family | Admitting: Family

## 2018-08-28 DIAGNOSIS — M799 Soft tissue disorder, unspecified: Secondary | ICD-10-CM | POA: Diagnosis present

## 2018-08-28 DIAGNOSIS — M7989 Other specified soft tissue disorders: Secondary | ICD-10-CM

## 2018-08-28 NOTE — Telephone Encounter (Signed)
Copied from CRM [id number]. Topic: General - Other >> Aug 28, 2018  4:42 PM Debroah Loop wrote: Reason for CRM: Patient declined care from [facility]. Rep says Baxter Hire called to inquire about whether they had an open episode on the patient? No note in Epic. Cb#[phone]

## 2018-08-28 NOTE — Telephone Encounter (Signed)
I reached out to [facility] on 08/09/18 and patient was open, however she declined services. They were going to reach out again to patient. Today I called back to [facility] and spoke with Amber and she was going to speak with clinical manager and see what was going on with patients case.   According to case it looked like patient had declined services. However she was going to have clinical manager look at case

## 2018-08-30 NOTE — Telephone Encounter (Signed)
Noted thank you

## 2018-09-06 ENCOUNTER — Encounter: Payer: Self-pay | Admitting: Family

## 2018-09-06 ENCOUNTER — Other Ambulatory Visit: Payer: Self-pay | Admitting: Family

## 2018-09-06 DIAGNOSIS — M7989 Other specified soft tissue disorders: Secondary | ICD-10-CM

## 2018-09-09 ENCOUNTER — Ambulatory Visit: Payer: Self-pay | Admitting: Family

## 2018-09-09 NOTE — Progress Notes (Deleted)
Subjective:    Patient ID: Debra Hogan, female    DOB: 18-Apr-1961, 57 y.o.   MRN: [id number]  CC: Debra Hogan is a 57 y.o. female who presents today for follow up.   HPI: MRI left arm? Okay??  Confirm neurosurgery   HISTORY:  Past Medical History:  Diagnosis Date  . Arthritis   . COPD (chronic obstructive pulmonary disease) (HCC)   . Depression   . Fibromyalgia   . Irritable bowel syndrome   . MS (multiple sclerosis) (HCC)    Followed by Dr. Sherryll Burger   Past Surgical History:  Procedure Laterality Date  . ABDOMINAL HYSTERECTOMY     CERVIX intact  . CHOLECYSTECTOMY N/A 01/03/2016   Procedure: LAPAROSCOPIC CHOLECYSTECTOMY WITH INTRAOPERATIVE CHOLANGIOGRAM;  Surgeon: Lattie Haw, MD;  Location: [facility] ORS;  Service: General;  Laterality: N/A;  . ENDOSCOPIC RETROGRADE CHOLANGIOPANCREATOGRAPHY (ERCP) WITH PROPOFOL N/A 01/04/2016   Procedure: ENDOSCOPIC RETROGRADE CHOLANGIOPANCREATOGRAPHY (ERCP) WITH PROPOFOL;  Surgeon: Midge Minium, MD;  Location: [facility] ENDOSCOPY;  Service: Endoscopy;  Laterality: N/A;  . FOOT SURGERY  2001  . LAPAROSCOPIC ENDOMETRIOSIS FULGURATION     Family History  Problem Relation Age of Onset  . Hypertension Mother   . Hyperlipidemia Mother   . Arthritis Mother   . Cancer Father        throat  . Heart disease Brother   . Bipolar disorder Son   . Schizophrenia Son   . Cancer Maternal Grandfather        liver  . Cancer Paternal Grandmother        liver    Allergies: Valium [diazepam] Current Outpatient Medications on File Prior to Visit  Medication Sig Dispense Refill  . acyclovir (ZOVIRAX) 400 MG tablet Take 1 tablet (400 mg total) by mouth daily. (Patient taking differently: Take 400 mg by mouth at bedtime. ) 90 tablet 3  . Albuterol Sulfate (PROAIR RESPICLICK) 108 (90 Base) MCG/ACT AEPB Inhale 90 mcg into the lungs every 6 (six) hours as needed (as needed for cough, wheezing). (Patient taking differently: Inhale 180 mcg into the lungs every 6 (six)  hours as needed (as needed for cough, wheezing). ) 1 each 2  . aspirin 81 MG chewable tablet Chew 81 mg by mouth daily.     . baclofen (LIORESAL) 10 MG tablet Take 1 tablet (10 mg total) by mouth 3 (three) times daily as needed for muscle spasms. 30 each 4  . carbamazepine (TEGRETOL) 200 MG tablet Take 200 mg by mouth 2 (two) times daily.    . cholecalciferol (VITAMIN D) 1000 units tablet Take 1,000 Units by mouth daily.    . diclofenac sodium (VOLTAREN) 1 % GEL Apply 4 g topically 4 (four) times daily. 1 Tube 3  . Dimethyl Fumarate 240 MG CPDR Take 240 mg by mouth 2 (two) times daily.    Marland Kitchen escitalopram (LEXAPRO) 20 MG tablet Take 1 tablet (20 mg total) by mouth daily. 90 tablet 1  . Fluticasone-Salmeterol (ADVAIR DISKUS) 100-50 MCG/DOSE AEPB Inhale 1 puff into the lungs 2 (two) times daily as needed (for shortness of breath). 60 each 3  . HYDROcodone-acetaminophen (NORCO) 5-325 MG tablet Take 1 tablet by mouth every 4 (four) hours as needed for moderate pain. 6 tablet 0  . lisinopril (PRINIVIL,ZESTRIL) 10 MG tablet Take 10 mg by mouth daily.    . Multiple Vitamins-Minerals (CENTRUM SILVER ULTRA WOMENS) TABS Take 1 tablet by mouth daily.    . ondansetron (ZOFRAN-ODT) 4 MG disintegrating tablet Take  1 tablet (4 mg total) by mouth every 6 (six) hours as needed for nausea or vomiting. 20 tablet 1  . rosuvastatin (CRESTOR) 10 MG tablet Take 1 tablet (10 mg total) by mouth daily. 90 tablet 3  . tiotropium (SPIRIVA) 18 MCG inhalation capsule Place 1 capsule (18 mcg total) into inhaler and inhale daily. 30 capsule 12  . traMADol (ULTRAM) 50 MG tablet Take 1 tablet (50 mg total) by mouth daily as needed. 30 tablet 1  . varenicline (CHANTIX PAK) 0.5 MG X 11 & 1 MG X 42 tablet Take one 0.5 mg tablet by mouth once daily for 3 days, then increase to one 0.5 mg tablet twice daily for 4 days, then increase to one 1 mg tablet twice daily. 53 tablet 0   No current facility-administered medications on file prior to  visit.     Social History   Tobacco Use  . Smoking status: Current Every Day Smoker    Packs/day: 1.00    Years: 43.00    Pack years: 43.00  . Smokeless tobacco: Never Used  Substance Use Topics  . Alcohol use: No    Alcohol/week: 0.0 standard drinks  . Drug use: No    Review of Systems    Objective:    There were no vitals taken for this visit. BP Readings from Last 3 Encounters:  08/20/18 132/80  08/09/18 130/84  07/22/18 124/61   Wt Readings from Last 3 Encounters:  08/20/18 156 lb (70.8 kg)  08/09/18 160 lb (72.6 kg)  07/22/18 165 lb (74.8 kg)    Physical Exam     Assessment & Plan:   Problem List Items Addressed This Visit    None       I am having Debra Hogan maintain her acyclovir, Albuterol Sulfate, rosuvastatin, aspirin, Fluticasone-Salmeterol, Dimethyl Fumarate, CENTRUM SILVER ULTRA WOMENS, lisinopril, carbamazepine, cholecalciferol, HYDROcodone-acetaminophen, baclofen, escitalopram, ondansetron, diclofenac sodium, traMADol, tiotropium, and varenicline.   No orders of the defined types were placed in this encounter.   Return precautions given.   Risks, benefits, and alternatives of the medications and treatment plan prescribed today were discussed, and patient expressed understanding.   Education regarding symptom management and diagnosis given to patient on AVS.  Continue to follow with Allegra Grana, FNP for routine health maintenance.   Debra Hogan and I agreed with plan.   Rennie Plowman, FNP

## 2018-09-11 ENCOUNTER — Telehealth: Payer: Self-pay | Admitting: Family

## 2018-09-11 NOTE — Telephone Encounter (Signed)
Baxter Hire,   Would you call over to   [facility] [address] [place]  [address]  [place], [place] [address]  [phone]    And ask if patient okay to have MRI right arm after right embolization of aneursym. I wasn't sure if any metal/clip placed. She had left emobolization done in 04/2018. She was seen by Dr Arrie Senate

## 2018-09-11 NOTE — Telephone Encounter (Signed)
[facility] [phone] and spoke with Paulette to verify if clip was placed . They will and see if there is clip placed and call me back .

## 2018-09-11 NOTE — Telephone Encounter (Signed)
    I spoke with patient on 09/05/18 in regards to the ultrasound on her right arm and she wanted to go ahead with the MRI. I left her a message to call our office back.

## 2018-09-11 NOTE — Telephone Encounter (Signed)
Call pt  Awaiting on hearing from her regarding mychart message 09/06/18  She cannot proceed with MRI on 09/22/18 until we can confirm.

## 2018-09-11 NOTE — Telephone Encounter (Signed)
Pt returned call

## 2018-09-11 NOTE — Telephone Encounter (Signed)
Patient called back

## 2018-09-11 NOTE — Unmapped (Signed)
Kristen from [place] called d/t patient having a MRI on 09/19/18 and wanted to know if patient has a clip. Reviewed notes and patient had coiling. Will discuss with Dr.Sasaki Adams to review and called Kristen back with answer.

## 2018-09-12 ENCOUNTER — Telehealth: Payer: Self-pay

## 2018-09-12 ENCOUNTER — Telehealth: Payer: Self-pay | Admitting: Family

## 2018-09-12 DIAGNOSIS — M25532 Pain in left wrist: Secondary | ICD-10-CM

## 2018-09-12 NOTE — Telephone Encounter (Signed)
Return patient call

## 2018-09-12 NOTE — Telephone Encounter (Signed)
Patient called inquiring about a pulse oximeter that Dr. Ardyth Man had suggested. Notified patient that we do not have those and she Swartz purchase those over the counter from a local pharmacy. Patient understands and has no further questions at this time.

## 2018-09-12 NOTE — Telephone Encounter (Addendum)
Spoke with patient she states there are no metal clips or plates placed from the left embolization .  She states she was to have follow up MRA monitoring aneursym she believes she should be ok to do MRI.

## 2018-09-12 NOTE — Telephone Encounter (Signed)
Copied from CRM [id number]. Topic: General - Other >> Sep 12, 2018  5:00 PM Stephannie Li, NT wrote: Reason for CRM: Patient called  back and would like a call tomorrow afternoon regarding message left to call the office yesterday ,see CRM  On 09/11/18

## 2018-09-13 NOTE — Telephone Encounter (Signed)
Spoke with patient she states that she reviewed notes from her [facility] and there are no clip or metal in your head from embolization.

## 2018-09-13 NOTE — Telephone Encounter (Signed)
Left voice mail message to call. ?

## 2018-09-13 NOTE — Telephone Encounter (Signed)
Patient states she has no clip or metal in her head from embolization , she reviewed mychart notes from [facility] .

## 2018-09-16 ENCOUNTER — Ambulatory Visit: Admission: RE | Admit: 2018-09-16 | Payer: Medicare Other | Source: Ambulatory Visit

## 2018-09-16 ENCOUNTER — Telehealth: Payer: Self-pay | Admitting: Family

## 2018-09-16 DIAGNOSIS — M7989 Other specified soft tissue disorders: Secondary | ICD-10-CM

## 2018-09-16 NOTE — Telephone Encounter (Signed)
Order for MRI needs to be changed

## 2018-09-16 NOTE — Telephone Encounter (Unsigned)
Copied from CRM [id number]. Topic: Quick Communication - See Telephone Encounter >> Sep 16, 2018  9:18 AM Floria Raveling A wrote: CRM for notification. See Telephone encounter for: 09/16/18. Clydie Braun from the MRI department at [facility] called in and stated that the order for the mri needs to be changed.  She would like a call back [phone].  Pt appt is on sept 29th   Chage to : MR right Forearm with/without con [phone] Fax number

## 2018-09-16 NOTE — Telephone Encounter (Signed)
See message below , ? Corrected order

## 2018-09-17 NOTE — Telephone Encounter (Signed)
Please advise 

## 2018-09-17 NOTE — Telephone Encounter (Signed)
Debra Hogan called back stating that the status of the MRI still says pending so she cannot do anything with it. She is requesting call back at [phone].

## 2018-09-18 ENCOUNTER — Encounter: Payer: Self-pay | Admitting: Family

## 2018-09-18 NOTE — Telephone Encounter (Signed)
MRI orders. I'm not sure if she is needing a change in the order but she did ask to speak with [facility]

## 2018-09-18 NOTE — Telephone Encounter (Signed)
Clydie Braun calling back from MRI department would like a call back regarding referral. She states that patient is scheduled Sunday 09/22/18. DB#[phone]

## 2018-09-18 NOTE — Telephone Encounter (Signed)
Spoke with Paulette at [facility] Neurosurgery, patient had MRI T-spine w/o contrast 6 months, c-spine, Brain in 01/2018, she will look through records and get back to [place]. Advised that patient is scheduled for 09/22/18 MRI foream.

## 2018-09-18 NOTE — Telephone Encounter (Signed)
Left message to call back  Sending mychart

## 2018-09-18 NOTE — Telephone Encounter (Signed)
Debra Hogan, I am so sorry for all the back and forth. Really, I know you have spent a lot of time on this. I should have gone with orthopedics from the start as I think a better plan for Debra Hogan.   In saying that we can cancel her MRI if you would call them and let them know  .

## 2018-09-18 NOTE — Unmapped (Signed)
Need information for MRI of forearm. Is patient safe to have MRI on 09/22/18.

## 2018-09-19 NOTE — Telephone Encounter (Signed)
It has been cancelled. Please disregard the message I sent regarding her MRI this morning. I didn't see this message when I sent it to you. Thanks! Melissa

## 2018-09-22 ENCOUNTER — Ambulatory Visit: Admission: RE | Admit: 2018-09-22 | Payer: Medicare Other | Source: Ambulatory Visit

## 2018-09-25 NOTE — Unmapped (Signed)
[facility] Refill Coordination Note  Specialty Medication(s): TECFIDERA 240      Jennifer Lewis, DOB: 12-Feb-1961  Phone: [phone] (home) , Alternate phone contact: N/A  Phone or address changes today?: No  All above HIPAA information was verified with patient.  Shipping Address: [address]  [place] [address]   Insurance changes? No    Completed refill call assessment today to schedule patient's medication shipment from the [facility] [phone]).      Confirmed the medication and dosage are correct and have not changed: Yes, regimen is correct and unchanged.    Confirmed patient started or stopped the following medications in the past month:  No, there are no changes reported at this time.    Are you tolerating your medication?:  [place] reports tolerating the medication.    ADHERENCE        Did you miss any doses in the past 4 weeks? Yes.  Daneisha reports missing 1 days of medication therapy in the last 4 weeks.  Gesenia reports SLEEPING THROUGH NIGHT DOSE as the cause of their non-adherance.    FINANCIAL/SHIPPING    Delivery Scheduled: Yes, Expected medication delivery date: 10/4 VIA [facility]     The patient will receive a drug information handout for each medication shipped and additional FDA Medication Guides as required.      Jennifer Lewis did not have any additional questions at this time.    Delivery address validated in Epic.    We will follow up with patient monthly for standard refill processing and delivery.      Thank you,  Westley Gambles   [facility] Specialty Technician

## 2018-09-26 MED FILL — TECFIDERA 240 MG CAPSULE,DELAYED RELEASE: ORAL | 30 days supply | Qty: 60 | Fill #1

## 2018-09-26 MED FILL — TECFIDERA 240 MG CAPSULE,DELAYED RELEASE: 30 days supply | Qty: 60 | Fill #1 | Status: AC

## 2018-09-27 NOTE — Progress Notes (Deleted)
Subjective:    Patient ID: Debra Hogan, female    DOB: 12-22-1961, 57 y.o.   MRN: [id number]  CC: Debra Hogan is a 57 y.o. female who presents today for follow up.   HPI: Depression-  lexapro  ?counseling   HISTORY:  Past Medical History:  Diagnosis Date  . Arthritis   . COPD (chronic obstructive pulmonary disease) (HCC)   . Depression   . Fibromyalgia   . Irritable bowel syndrome   . MS (multiple sclerosis) (HCC)    Followed by Dr. Sherryll Burger   Past Surgical History:  Procedure Laterality Date  . ABDOMINAL HYSTERECTOMY     CERVIX intact  . CHOLECYSTECTOMY N/A 01/03/2016   Procedure: LAPAROSCOPIC CHOLECYSTECTOMY WITH INTRAOPERATIVE CHOLANGIOGRAM;  Surgeon: Lattie Haw, MD;  Location: [facility] ORS;  Service: General;  Laterality: N/A;  . ENDOSCOPIC RETROGRADE CHOLANGIOPANCREATOGRAPHY (ERCP) WITH PROPOFOL N/A 01/04/2016   Procedure: ENDOSCOPIC RETROGRADE CHOLANGIOPANCREATOGRAPHY (ERCP) WITH PROPOFOL;  Surgeon: Midge Minium, MD;  Location: [facility] ENDOSCOPY;  Service: Endoscopy;  Laterality: N/A;  . FOOT SURGERY  2001  . LAPAROSCOPIC ENDOMETRIOSIS FULGURATION     Family History  Problem Relation Age of Onset  . Hypertension Mother   . Hyperlipidemia Mother   . Arthritis Mother   . Cancer Father        throat  . Heart disease Brother   . Bipolar disorder Son   . Schizophrenia Son   . Cancer Maternal Grandfather        liver  . Cancer Paternal Grandmother        liver    Allergies: Valium [diazepam] Current Outpatient Medications on File Prior to Visit  Medication Sig Dispense Refill  . acyclovir (ZOVIRAX) 400 MG tablet Take 1 tablet (400 mg total) by mouth daily. (Patient taking differently: Take 400 mg by mouth at bedtime. ) 90 tablet 3  . Albuterol Sulfate (PROAIR RESPICLICK) 108 (90 Base) MCG/ACT AEPB Inhale 90 mcg into the lungs every 6 (six) hours as needed (as needed for cough, wheezing). (Patient taking differently: Inhale 180 mcg into the lungs every 6 (six) hours  as needed (as needed for cough, wheezing). ) 1 each 2  . aspirin 81 MG chewable tablet Chew 81 mg by mouth daily.     . baclofen (LIORESAL) 10 MG tablet Take 1 tablet (10 mg total) by mouth 3 (three) times daily as needed for muscle spasms. 30 each 4  . carbamazepine (TEGRETOL) 200 MG tablet Take 200 mg by mouth 2 (two) times daily.    . cholecalciferol (VITAMIN D) 1000 units tablet Take 1,000 Units by mouth daily.    . diclofenac sodium (VOLTAREN) 1 % GEL Apply 4 g topically 4 (four) times daily. 1 Tube 3  . Dimethyl Fumarate 240 MG CPDR Take 240 mg by mouth 2 (two) times daily.    Marland Kitchen escitalopram (LEXAPRO) 20 MG tablet Take 1 tablet (20 mg total) by mouth daily. 90 tablet 1  . Fluticasone-Salmeterol (ADVAIR DISKUS) 100-50 MCG/DOSE AEPB Inhale 1 puff into the lungs 2 (two) times daily as needed (for shortness of breath). 60 each 3  . HYDROcodone-acetaminophen (NORCO) 5-325 MG tablet Take 1 tablet by mouth every 4 (four) hours as needed for moderate pain. 6 tablet 0  . lisinopril (PRINIVIL,ZESTRIL) 10 MG tablet Take 10 mg by mouth daily.    . Multiple Vitamins-Minerals (CENTRUM SILVER ULTRA WOMENS) TABS Take 1 tablet by mouth daily.    . ondansetron (ZOFRAN-ODT) 4 MG disintegrating tablet Take 1 tablet (  4 mg total) by mouth every 6 (six) hours as needed for nausea or vomiting. 20 tablet 1  . rosuvastatin (CRESTOR) 10 MG tablet Take 1 tablet (10 mg total) by mouth daily. 90 tablet 3  . tiotropium (SPIRIVA) 18 MCG inhalation capsule Place 1 capsule (18 mcg total) into inhaler and inhale daily. 30 capsule 12  . traMADol (ULTRAM) 50 MG tablet Take 1 tablet (50 mg total) by mouth daily as needed. 30 tablet 1  . varenicline (CHANTIX PAK) 0.5 MG X 11 & 1 MG X 42 tablet Take one 0.5 mg tablet by mouth once daily for 3 days, then increase to one 0.5 mg tablet twice daily for 4 days, then increase to one 1 mg tablet twice daily. 53 tablet 0   No current facility-administered medications on file prior to  visit.     Social History   Tobacco Use  . Smoking status: Current Every Day Smoker    Packs/day: 1.00    Years: 43.00    Pack years: 43.00  . Smokeless tobacco: Never Used  Substance Use Topics  . Alcohol use: No    Alcohol/week: 0.0 standard drinks  . Drug use: No    Review of Systems    Objective:    There were no vitals taken for this visit. BP Readings from Last 3 Encounters:  08/20/18 132/80  08/09/18 130/84  07/22/18 124/61   Wt Readings from Last 3 Encounters:  08/20/18 156 lb (70.8 kg)  08/09/18 160 lb (72.6 kg)  07/22/18 165 lb (74.8 kg)    Physical Exam     Assessment & Plan:   Problem List Items Addressed This Visit    None       I am having Travia D. Diclemente maintain her acyclovir, Albuterol Sulfate, rosuvastatin, aspirin, Fluticasone-Salmeterol, Dimethyl Fumarate, CENTRUM SILVER ULTRA WOMENS, lisinopril, carbamazepine, cholecalciferol, HYDROcodone-acetaminophen, baclofen, escitalopram, ondansetron, diclofenac sodium, traMADol, tiotropium, and varenicline.   No orders of the defined types were placed in this encounter.   Return precautions given.   Risks, benefits, and alternatives of the medications and treatment plan prescribed today were discussed, and patient expressed understanding.   Education regarding symptom management and diagnosis given to patient on AVS.  Continue to follow with Allegra Grana, FNP for routine health maintenance.   [place] D Himes and I agreed with plan.   Rennie Plowman, FNP

## 2018-09-29 NOTE — Progress Notes (Deleted)
Cardiology Office Note  Date:  09/29/2018   ID:  Debra Hogan, DOB [birth year], MRN [id number]  PCP:  Allegra Grana, FNP   No chief complaint on file.   HPI:   smoker Small Pericardial effusion seen on CT chest  .h/o smoking fibromyalgia Referred by Rennie Plowman for consultation of her aortic atherosclerosis  CT chest 04/2018 Aortic Atherosclerosis (ICD10-I70.0) and Emphysema (ICD10-J43.9).  CT ABD 09/2013  No show for echo  PMH:   has a past medical history of Arthritis, COPD (chronic obstructive pulmonary disease) (HCC), Depression, Fibromyalgia, Irritable bowel syndrome, and MS (multiple sclerosis) (HCC).  PSH:    Past Surgical History:  Procedure Laterality Date  . ABDOMINAL HYSTERECTOMY     CERVIX intact  . CHOLECYSTECTOMY N/A 01/03/2016   Procedure: LAPAROSCOPIC CHOLECYSTECTOMY WITH INTRAOPERATIVE CHOLANGIOGRAM;  Surgeon: Lattie Haw, MD;  Location: [facility] ORS;  Service: General;  Laterality: N/A;  . ENDOSCOPIC RETROGRADE CHOLANGIOPANCREATOGRAPHY (ERCP) WITH PROPOFOL N/A 01/04/2016   Procedure: ENDOSCOPIC RETROGRADE CHOLANGIOPANCREATOGRAPHY (ERCP) WITH PROPOFOL;  Surgeon: Midge Minium, MD;  Location: [facility] END[facility]COPY;  Service: Endoscopy;  Laterality: N/A;  . FOOT SURGERY  2001  . LAPAROSCOPIC ENDOMETRIOSIS FULGURATION      Current Outpatient Medications  Medication Sig Dispense Refill  . acyclovir (ZOVIRAX) 400 MG tablet Take 1 tablet (400 mg total) by mouth daily. (Patient taking differently: Take 400 mg by mouth at bedtime. ) 90 tablet 3  . Albuterol Sulfate (PROAIR RESPICLICK) 108 (90 Base) MCG/ACT AEPB Inhale 90 mcg into the lungs every 6 (six) hours as needed (as needed for cough, wheezing). (Patient taking differently: Inhale 180 mcg into the lungs every 6 (six) hours as needed (as needed for cough, wheezing). ) 1 each 2  . aspirin 81 MG chewable tablet Chew 81 mg by mouth daily.     . baclofen (LIORESAL) 10 MG tablet Take 1 tablet (10 mg total) by mouth 3  (three) times daily as needed for muscle spasms. 30 each 4  . carbamazepine (TEGRETOL) 200 MG tablet Take 200 mg by mouth 2 (two) times daily.    . cholecalciferol (VITAMIN D) 1000 units tablet Take 1,000 Units by mouth daily.    . diclofenac sodium (VOLTAREN) 1 % GEL Apply 4 g topically 4 (four) times daily. 1 Tube 3  . Dimethyl Fumarate 240 MG CPDR Take 240 mg by mouth 2 (two) times daily.    Marland Kitchen escitalopram (LEXAPRO) 20 MG tablet Take 1 tablet (20 mg total) by mouth daily. 90 tablet 1  . Fluticasone-Salmeterol (ADVAIR DISKUS) 100-50 MCG/DOSE AEPB Inhale 1 puff into the lungs 2 (two) times daily as needed (for shortness of breath). 60 each 3  . HYDROcodone-acetaminophen (NORCO) 5-325 MG tablet Take 1 tablet by mouth every 4 (four) hours as needed for moderate pain. 6 tablet 0  . lisinopril (PRINIVIL,ZESTRIL) 10 MG tablet Take 10 mg by mouth daily.    . Multiple Vitamins-Minerals (CENTRUM SILVER ULTRA WOMENS) TABS Take 1 tablet by mouth daily.    . ondansetron (ZOFRAN-ODT) 4 MG disintegrating tablet Take 1 tablet (4 mg total) by mouth every 6 (six) hours as needed for nausea or vomiting. 20 tablet 1  . rosuvastatin (CRESTOR) 10 MG tablet Take 1 tablet (10 mg total) by mouth daily. 90 tablet 3  . tiotropium (SPIRIVA) 18 MCG inhalation capsule Place 1 capsule (18 mcg total) into inhaler and inhale daily. 30 capsule 12  . traMADol (ULTRAM) 50 MG tablet Take 1 tablet (50 mg total) by mouth daily as  needed. 30 tablet 1  . varenicline (CHANTIX PAK) 0.5 MG X 11 & 1 MG X 42 tablet Take one 0.5 mg tablet by mouth once daily for 3 days, then increase to one 0.5 mg tablet twice daily for 4 days, then increase to one 1 mg tablet twice daily. 53 tablet 0   No current facility-administered medications for this visit.      Allergies:   Valium [diazepam]   Social History:  The patient  reports that she has been smoking. She has a 43.00 pack-year smoking history. She has never used smokeless tobacco. She  reports that she does not drink alcohol or use drugs.   Family History:   family history includes Arthritis in her mother; Bipolar disorder in her son; Cancer in her father, maternal grandfather, and paternal grandmother; Heart disease in her brother; Hyperlipidemia in her mother; Hypertension in her mother; Schizophrenia in her son.    Review of Systems: ROS   PHYSICAL EXAM: VS:  There were no vitals taken for this visit. , BMI There is no height or weight on file to calculate BMI. GEN: Well nourished, well developed, in no acute distress HEENT: normal Neck: no JVD, carotid bruits, or masses Cardiac: RRR; no murmurs, rubs, or gallops,no edema  Respiratory:  clear to auscultation bilaterally, normal work of breathing GI: soft, nontender, nondistended, + BS MS: no deformity or atrophy Skin: warm and dry, no rash Neuro:  Strength and sensation are intact Psych: euthymic mood, full affect    Recent Labs: 08/09/2018: ALT 13; BUN 14; Creatinine, Ser 0.64; Hemoglobin 11.9; Platelets 214.0; Potassium 3.7; Sodium 145; TSH 3.87    Lipid Panel Lab Results  Component Value Date   CHOL 303 (H) 05/08/2018   HDL 51.70 05/08/2018   LDLCALC 136 (H) 09/19/2013   TRIG 361.0 (H) 05/08/2018      Wt Readings from Last 3 Encounters:  08/20/18 156 lb (70.8 kg)  08/09/18 160 lb (72.6 kg)  07/22/18 165 lb (74.8 kg)       ASSESSMENT AND PLAN:  No diagnosis found.   Disposition:   F/U  6 months  No orders of the defined types were placed in this encounter.    Signed, Dossie Arbour, M.D., Ph.D. 09/29/2018  [facility] [place], [place] [phone]

## 2018-09-30 ENCOUNTER — Ambulatory Visit: Payer: Self-pay | Admitting: Cardiovascular Disease

## 2018-10-02 ENCOUNTER — Ambulatory Visit: Payer: Self-pay | Admitting: Family

## 2018-10-02 DIAGNOSIS — Z0289 Encounter for other administrative examinations: Secondary | ICD-10-CM

## 2018-10-16 NOTE — Unmapped (Signed)
Specialty Pharmacy - Neurology Medication Clinical Assessment       Jennifer Lewis is a 57 y.o. female contacted today regarding her specialty medication(s) dimethyl fumarate (TECFIDERA)    Verified patient's date of birth / HIPAA.    Medications reviewed and verified with patient: Allergies - Medications -      Specialty medication(s) and dose(s) confirmed: yes  Changes to medications: no  Changes to insurance: no     Is therapy still appropriate given the disease, patient response, and medical condition? yes  Is therapy still effective? yes    Medication Adherence    Patient reported X missed doses in the last month:  3  Specialty Medication:  Tecfidera  Patient is on additional specialty medications:  No  Patient is on more than two specialty medications:  No  Any gaps in refill history greater than 2 weeks in the last 3 months:  no  Demonstrates understanding of importance of adherence:  yes  Informant:  patient  Reliability of informant:  reliable  Provider-estimated medication adherence level:  76-89%  Patient is at risk for Non-Adherence:  Yes  The following intervention(s) were discussed with the patient:  Alarm Clock, Daily routines, Medication placed where it can be noticed  Reasons for non-adherence:  patient forgets      Adherence tools used:  patient uses a pill box to manage medications   Other adherence tool:  part of mealtime routine       Confirmed plan for next specialty medication refill:  not applicable  Refills needed for supportive medications:  not needed           Adverse Effects        *All other systems reviewed and are negative       Drug Interactions    Drug interactions evaluated:  yes  Clinically relevant drug interactions identified:  no      Provided the patient with educational material regarding drug interactions:  not applicable           Patient Counseling    Counseled the patient on the following:  doses and administration discussed, adherence and missed doses discussed Damita Dunnings, PharmD  PGY2 Pharmacy Resident, Ambulatory Care    Worthy Flank, PharmD, CPP  Clinical Pharmacist, [facility]  Phone: [phone]

## 2018-10-21 ENCOUNTER — Encounter (INDEPENDENT_AMBULATORY_CARE_PROVIDER_SITE_OTHER): Payer: Self-pay

## 2018-10-21 ENCOUNTER — Encounter: Payer: Self-pay | Admitting: Family

## 2018-10-21 ENCOUNTER — Ambulatory Visit (INDEPENDENT_AMBULATORY_CARE_PROVIDER_SITE_OTHER): Payer: Medicare Other | Admitting: Family

## 2018-10-21 VITALS — BP 130/70 | HR 110 | Temp 98.5°F | Resp 16 | Ht 66.0 in | Wt 159.4 lb

## 2018-10-21 DIAGNOSIS — I10 Essential (primary) hypertension: Secondary | ICD-10-CM | POA: Diagnosis not present

## 2018-10-21 DIAGNOSIS — R5383 Other fatigue: Secondary | ICD-10-CM | POA: Diagnosis not present

## 2018-10-21 DIAGNOSIS — Z716 Tobacco abuse counseling: Secondary | ICD-10-CM | POA: Insufficient documentation

## 2018-10-21 DIAGNOSIS — J449 Chronic obstructive pulmonary disease, unspecified: Secondary | ICD-10-CM

## 2018-10-21 DIAGNOSIS — Z23 Encounter for immunization: Secondary | ICD-10-CM | POA: Diagnosis not present

## 2018-10-21 DIAGNOSIS — F419 Anxiety disorder, unspecified: Secondary | ICD-10-CM | POA: Insufficient documentation

## 2018-10-21 MED ORDER — VARENICLINE TARTRATE 0.5 MG X 11 & 1 MG X 42 PO MISC: ORAL | 0 refills | Status: DC

## 2018-10-21 NOTE — Assessment & Plan Note (Signed)
Anxiety improved.  We will continue Lexapro.

## 2018-10-21 NOTE — Progress Notes (Signed)
Subjective:    Patient ID: Debra Hogan, female    DOB: 1961/09/03, 57 y.o.   MRN: [id number]  CC: Debra Hogan is a 57 y.o. female who presents today for follow up.   HPI: HTN- unsure if on lisinopril. No CP.   Anxiety-improved.  lexapro increased 2 months ago. Anxiety over business matters,not having anxiety attacks anymore. No depression.   Complains of fatigue which is chronic, x several months, worsening. Unable to exercise. Sleeping approx 8 hours. Trouble falling and staying asleep. Not wearing osa.    COPD- No increase SOB today. No cough. Symptoms have improved on spiriva.    OSA- Not wearing Cipap anymore. Noise bothered husband.   Smoking cessation- Needs chantix starter pack refilled  Doesn't screen for mammogram.   MS- managed by neurology [facility]; on ticfedera, tegretol.   No h/o seizure use. No alcohol use.   Dr Kirke Corin - rescheduled from 09/2018 to 11/2018.   Right wrist- seen by orthopedics- velcro wrist. Recommend MRI is no improvement.   Pulmonology 07/2018- started spiriva. Nighttime 02  HISTORY:  Past Medical History:  Diagnosis Date  . Arthritis   . COPD (chronic obstructive pulmonary disease) (HCC)   . Depression   . Fibromyalgia   . Irritable bowel syndrome   . MS (multiple sclerosis) (HCC)    Followed by Dr. Sherryll Burger   Past Surgical History:  Procedure Laterality Date  . ABDOMINAL HYSTERECTOMY     CERVIX intact  . CHOLECYSTECTOMY N/A 01/03/2016   Procedure: LAPAROSCOPIC CHOLECYSTECTOMY WITH INTRAOPERATIVE CHOLANGIOGRAM;  Surgeon: Lattie Haw, MD;  Location: [facility] ORS;  Service: General;  Laterality: N/A;  . ENDOSCOPIC RETROGRADE CHOLANGIOPANCREATOGRAPHY (ERCP) WITH PROPOFOL N/A 01/04/2016   Procedure: ENDOSCOPIC RETROGRADE CHOLANGIOPANCREATOGRAPHY (ERCP) WITH PROPOFOL;  Surgeon: Midge Minium, MD;  Location: [facility] END[facility];  Service: Endoscopy;  Laterality: N/A;  . FOOT SURGERY  2001  . LAPAROSCOPIC ENDOMETRIOSIS FULGURATION     Family History    Problem Relation Age of Onset  . Hypertension Mother   . Hyperlipidemia Mother   . Arthritis Mother   . Cancer Father        throat  . Heart disease Brother   . Bipolar disorder Son   . Schizophrenia Son   . Cancer Maternal Grandfather        liver  . Cancer Paternal Grandmother        liver    Allergies: Valium [diazepam] Current Outpatient Medications on File Prior to Visit  Medication Sig Dispense Refill  . acyclovir (ZOVIRAX) 400 MG tablet Take 1 tablet (400 mg total) by mouth daily. (Patient taking differently: Take 400 mg by mouth at bedtime. ) 90 tablet 3  . Albuterol Sulfate (PROAIR RESPICLICK) 108 (90 Base) MCG/ACT AEPB Inhale 90 mcg into the lungs every 6 (six) hours as needed (as needed for cough, wheezing). (Patient taking differently: Inhale 180 mcg into the lungs every 6 (six) hours as needed (as needed for cough, wheezing). ) 1 each 2  . aspirin 81 MG tablet Take 81 mg by mouth daily.    . baclofen (LIORESAL) 10 MG tablet Take 1 tablet (10 mg total) by mouth 3 (three) times daily as needed for muscle spasms. 30 each 4  . carbamazepine (TEGRETOL) 200 MG tablet Take 200 mg by mouth 2 (two) times daily.    . cholecalciferol (VITAMIN D) 1000 units tablet Take 1,000 Units by mouth daily.    . diclofenac sodium (VOLTAREN) 1 % GEL Apply 4 g topically 4 (  four) times daily. 1 Tube 3  . Dimethyl Fumarate 240 MG CPDR Take 240 mg by mouth 2 (two) times daily.    Marland Kitchen escitalopram (LEXAPRO) 20 MG tablet Take 1 tablet (20 mg total) by mouth daily. 90 tablet 1  . Fluticasone-Salmeterol (ADVAIR DISKUS) 100-50 MCG/DOSE AEPB Inhale 1 puff into the lungs 2 (two) times daily as needed (for shortness of breath). 60 each 3  . lisinopril (PRINIVIL,ZESTRIL) 10 MG tablet Take 10 mg by mouth daily.    . Multiple Vitamins-Minerals (CENTRUM SILVER ULTRA WOMENS) TABS Take 1 tablet by mouth daily.    . ondansetron (ZOFRAN-ODT) 4 MG disintegrating tablet Take 1 tablet (4 mg total) by mouth every 6 (six)  hours as needed for nausea or vomiting. 20 tablet 1  . rosuvastatin (CRESTOR) 10 MG tablet Take 1 tablet (10 mg total) by mouth daily. 90 tablet 3  . traMADol (ULTRAM) 50 MG tablet Take 1 tablet (50 mg total) by mouth daily as needed. 30 tablet 1  . tiotropium (SPIRIVA) 18 MCG inhalation capsule Place 1 capsule (18 mcg total) into inhaler and inhale daily. 30 capsule 12   No current facility-administered medications on file prior to visit.     Social History   Tobacco Use  . Smoking status: Current Every Day Smoker    Packs/day: 1.00    Years: 43.00    Pack years: 43.00  . Smokeless tobacco: Never Used  Substance Use Topics  . Alcohol use: No    Alcohol/week: 0.0 standard drinks  . Drug use: No    Review of Systems  Constitutional: Negative for chills and fever.  Respiratory: Negative for cough, shortness of breath (at baseline) and wheezing.   Cardiovascular: Negative for chest pain and palpitations.  Gastrointestinal: Negative for nausea and vomiting.  Psychiatric/Behavioral: Positive for sleep disturbance. Negative for suicidal ideas. The patient is nervous/anxious (improved).       Objective:    BP 130/70   Pulse (!) 110   Temp 98.5 F (36.9 C) (Oral)   Resp 16   Ht 5\' 6"  (1.676 m)   Wt 159 lb 6 oz (72.3 kg)   SpO2 94%   BMI 25.72 kg/m  BP Readings from Last 3 Encounters:  10/21/18 130/70  08/20/18 132/80  08/09/18 130/84   Wt Readings from Last 3 Encounters:  10/21/18 159 lb 6 oz (72.3 kg)  08/20/18 156 lb (70.8 kg)  08/09/18 160 lb (72.6 kg)    Physical Exam  Constitutional: She appears well-developed and well-nourished.  Eyes: Conjunctivae are normal.  Cardiovascular: Normal rate, regular rhythm, normal heart sounds and normal pulses.  Pulmonary/Chest: Effort normal and breath sounds normal. She has no wheezes. She has no rhonchi. She has no rales.  Neurological: She is alert.  Skin: Skin is warm and dry.  Psychiatric: She has a normal mood and  affect. Her speech is normal and behavior is normal. Thought content normal.  Vitals reviewed.      Assessment & Plan:   Problem List Items Addressed This Visit      Cardiovascular and Mediastinum   HTN (hypertension)    Improved while rested in room. Unsure if on lisinopril. Patient will call from home and let me know so we can manage blood pressure accordingly.      Relevant Medications   aspirin 81 MG tablet     Respiratory   COPD, moderate (HCC)    Symptoms controlled.  Will follow      Relevant Medications  varenicline (CHANTIX PAK) 0.5 MG X 11 & 1 MG X 42 tablet     Other   Fatigue    Acute on chronic.  Suspect multifactorial.  Not compliant with CPAP machine.  Advised to speak with her dentist about oral appliance and also discussed risk of untreated sleep apnea.  Unable to exercise due to fibromyalgia, MS.  Discussed with her starting very slowly walking program, getting outdoors every day.  Reviewed labs done this year, thyroid normal, no anemia, vitamin d deficiency.  Had discussion about Wellbutrin however this time patient politely declines as she would like to Chantix again for smoking cessation, and would be unable to do both.       Encounter for smoking cessation counseling - Primary   Relevant Medications   varenicline (CHANTIX PAK) 0.5 MG X 11 & 1 MG X 42 tablet   Anxiety    Anxiety improved.  We will continue Lexapro.          I have discontinued Latise D. Bifulco's HYDROcodone-acetaminophen. I am also having her maintain her acyclovir, Albuterol Sulfate, rosuvastatin, Fluticasone-Salmeterol, Dimethyl Fumarate, CENTRUM SILVER ULTRA WOMENS, lisinopril, carbamazepine, cholecalciferol, baclofen, escitalopram, ondansetron, diclofenac sodium, traMADol, tiotropium, aspirin, and varenicline.   Meds ordered this encounter  Medications  . varenicline (CHANTIX PAK) 0.5 MG X 11 & 1 MG X 42 tablet    Sig: Take one 0.5 mg tablet by mouth once daily for 3 days, then  increase to one 0.5 mg tablet twice daily for 4 days, then increase to one 1 mg tablet twice daily.    Dispense:  53 tablet    Refill:  0    Order Specific Question:   Supervising Provider    Answer:   Sherlene Shams [[id number]]    Return precautions given.   Risks, benefits, and alternatives of the medications and treatment plan prescribed today were discussed, and patient expressed understanding.   Education regarding symptom management and diagnosis given to patient on AVS.  Continue to follow with Allegra Grana, FNP for routine health maintenance.   Rexine D Banbury and I agreed with plan.   Rennie Plowman, FNP

## 2018-10-21 NOTE — Patient Instructions (Addendum)
Speak with dentist about dental device for sleep apnea.  It is important to treat ; untreated can increase risk of heart attack, stroke.   Chantix starter pack.   Ensure you discuss repeat echocardiagram with Dr Kirke Corin in December.   Let me know if you are lisinopril or not when you get home.

## 2018-10-21 NOTE — Assessment & Plan Note (Signed)
Improved while rested in room. Unsure if on lisinopril. Patient will call from home and let me know so we can manage blood pressure accordingly.

## 2018-10-21 NOTE — Assessment & Plan Note (Addendum)
Acute on chronic.  Suspect multifactorial.  Not compliant with CPAP machine.  Advised to speak with her dentist about oral appliance and also discussed risk of untreated sleep apnea.  Unable to exercise due to fibromyalgia, MS.  Discussed with her starting very slowly walking program, getting outdoors every day.  Reviewed labs done this year, thyroid normal, no anemia, vitamin d deficiency.  Had discussion about Wellbutrin however this time patient politely declines as she would like to Chantix again for smoking cessation, and would be unable to do both.

## 2018-10-21 NOTE — Assessment & Plan Note (Signed)
Symptoms controlled.  Will follow

## 2018-10-28 NOTE — Unmapped (Signed)
[facility] Refill Coordination Note  Specialty Medication(s): TECFIDERA 240      Jennifer Lewis, DOB: 1961/12/12  Phone: [phone] (home) , Alternate phone contact: N/A  Phone or address changes today?: No  All above HIPAA information was verified with patient.  Shipping Address: [address]  [place] [address]   Insurance changes? No    Completed refill call assessment today to schedule patient's medication shipment from the [facility] [phone]).      Confirmed the medication and dosage are correct and have not changed: Yes, regimen is correct and unchanged.    Confirmed patient started or stopped the following medications in the past month:  No, there are no changes reported at this time.    Are you tolerating your medication?:  Jennifer Lewis reports side effects of METAL TASTE IN HER MOUTH. UNSURE IF RELATED TO TECFIDERA. TRANSFERRED TO CPP STEPHANIE TO DISCUSS.Marland Kitchen    ADHERENCE    (Below is required for Medicare Part B or Transplant patients only - per drug):   How many tablets were dispensed last month: 30 DAYS  Patient currently has 6 DAYS remaining.    Did you miss any doses in the past 4 weeks? No missed doses reported.    FINANCIAL/SHIPPING    Delivery Scheduled: Yes, Expected medication delivery date: 11/6     Medication will be delivered via Next Day Courier to the home address in [facility].    The patient will receive a drug information handout for each medication shipped and additional FDA Medication Guides as required.      Jennifer Lewis did not have any additional questions at this time.    We will follow up with patient monthly for standard refill processing and delivery.      Thank you,  Westley Gambles   [facility] Specialty Technician

## 2018-10-28 NOTE — Unmapped (Signed)
Patient was transferred to me from the pharmacy due to question of metallic taste being a side effect from Tecfidera. She states that this has been happening for about 3 weeks and is a daily occurrence. She has not started any new prescription medications or over the counter medications/vitamins/supplements recently.    Discussed with her that metallic taste is not a reported side effect of Tecfidera but that I would do a search for any case reports and let her know if I find mention of this.     Additionally, she is currently taking Spiriva, Advair and Proair. She states she is very strict about rinsing her mouth after using Advair. She thinks the metallic taste Broman have started around the time she started using Spiriva but she's not 100% certain. I advised I would look more into the side effects with Spiriva and send her a MyChart message with any information.    Worthy Flank, PharmD, CPP  Clinical Pharmacist, [facility]  Phone: [phone]

## 2018-10-29 MED FILL — TECFIDERA 240 MG CAPSULE,DELAYED RELEASE: ORAL | 30 days supply | Qty: 60 | Fill #2

## 2018-10-29 MED FILL — TECFIDERA 240 MG CAPSULE,DELAYED RELEASE: 30 days supply | Qty: 60 | Fill #2 | Status: AC

## 2018-10-30 ENCOUNTER — Telehealth: Payer: Self-pay | Admitting: Internal Medicine

## 2018-10-30 NOTE — Telephone Encounter (Signed)
Patient calling  States that she has a floor model oxygen unit that she is unable to use Would like to know how to get it removed from home Please call to discuss

## 2018-10-31 NOTE — Telephone Encounter (Signed)
Explained that patient will need office visit and ONO performed. She has appt 12/3. Nothing further needed.

## 2018-11-08 ENCOUNTER — Other Ambulatory Visit: Payer: Self-pay | Admitting: Family

## 2018-11-08 NOTE — Telephone Encounter (Signed)
Requested medication (s) are due for refill today: yes  Requested medication (s) are on the active medication list: yes  Last refill:  Last filled by historical provider  Future visit scheduled: yes  Notes to clinic:  Medication last filled by historical provider    Requested Prescriptions  Pending Prescriptions Disp Refills   lisinopril (PRINIVIL,ZESTRIL) 10 MG tablet      Sig: Take 1 tablet (10 mg total) by mouth daily.     Cardiovascular:  ACE Inhibitors Passed - 11/08/2018  5:07 PM      Passed - Cr in normal range and within 180 days    Creat  Date Value Ref Range Status  02/05/2015 0.81 0.50 - 1.10 mg/dL Final   Creatinine, Ser  Date Value Ref Range Status  08/09/2018 0.64 0.40 - 1.20 mg/dL Final         Passed - K in normal range and within 180 days    Potassium  Date Value Ref Range Status  08/09/2018 3.7 3.5 - 5.1 mEq/L Final         Passed - Patient is not pregnant      Passed - Last BP in normal range    BP Readings from Last 1 Encounters:  10/21/18 130/70         Passed - Valid encounter within last 6 months    Recent Outpatient Visits          2 weeks ago Encounter for smoking cessation counseling   [facility] [place] Arnett, Lyn Records, FNP   3 months ago COPD, moderate [facility])   [facility] [place] Arnett, Lyn Records, FNP   5 months ago COPD with exacerbation [facility])   [facility] [place], Amil Amen, FNP   6 months ago Atherosclerosis of aorta [facility])   [facility] [place] Arnett, Lyn Records, FNP   6 months ago Routine physical examination   [facility] [place] Arnett, Lyn Records, FNP      Future Appointments            In 2 weeks Shane Crutch, MD [facility]    In 6 months O'Brien-Blaney, Vivianne Spence, LPN [facility] [place]   In 6 months Arnett, Lyn Records, FNP [facility], [facility]

## 2018-11-08 NOTE — Telephone Encounter (Unsigned)
Copied from CRM [id number]. Topic: Quick Communication - Rx Refill/Question >> Nov 08, 2018  4:46 PM Laural Benes, [place] C wrote: Medication: lisinopril (PRINIVIL,ZESTRIL) 10 MG tablet  Has the patient contacted their pharmacy? Yes  (Agent: If no, request that the patient contact the pharmacy for the refill.) (Agent: If yes, when and what did the pharmacy advise?)  Preferred Pharmacy (with phone number or street name): [facility] [address], [place] - [address] [phone] (Phone) [phone] (Fax)    Agent: Please be advised that RX refills [place] take up to 3 business days. We ask that you follow-up with your pharmacy.

## 2018-11-11 NOTE — Telephone Encounter (Signed)
Ok for [facility] to speak with patient in regards to below message  Left message for patient to call , see below message

## 2018-11-11 NOTE — Telephone Encounter (Signed)
Call pt  Is she on the lisinopril?  I have a refill request and she wasn't sure if she was on.

## 2018-11-14 NOTE — Telephone Encounter (Signed)
Patient is calling back and states she is completely out but yes she is on lisinopril.

## 2018-11-15 MED ORDER — LISINOPRIL 10 MG PO TABS: 10.0000 mg | ORAL_TABLET | Freq: Every day | ORAL | 4 refills | Status: DC

## 2018-11-15 NOTE — Telephone Encounter (Signed)
Call pt  I sent in lisinopril

## 2018-11-18 ENCOUNTER — Telehealth: Payer: Self-pay | Admitting: Family

## 2018-11-18 NOTE — Telephone Encounter (Signed)
Copied from CRM [id number]. Topic: Quick Communication - See Telephone Encounter >> Nov 18, 2018  3:08 PM Trula Slade wrote: CRM for notification. See Telephone encounter for: 11/18/18. Patient would like a refill of her acyclovir (ZOVIRAX) 400 MG tablet medication and have it sent to her preferred pharmacy [facility] on [address].

## 2018-11-19 NOTE — Telephone Encounter (Signed)
Left message for pt to contact office regarding refill. Refill is available at requested pharmacy.

## 2018-11-25 ENCOUNTER — Ambulatory Visit (INDEPENDENT_AMBULATORY_CARE_PROVIDER_SITE_OTHER): Payer: Medicare Other

## 2018-11-25 ENCOUNTER — Ambulatory Visit (INDEPENDENT_AMBULATORY_CARE_PROVIDER_SITE_OTHER): Payer: Medicare Other | Admitting: Family Medicine

## 2018-11-25 ENCOUNTER — Encounter: Payer: Self-pay | Admitting: Family Medicine

## 2018-11-25 VITALS — BP 112/60 | HR 86 | Temp 98.4°F | Ht 65.0 in | Wt 159.8 lb

## 2018-11-25 DIAGNOSIS — M545 Low back pain, unspecified: Secondary | ICD-10-CM

## 2018-11-25 DIAGNOSIS — M62830 Muscle spasm of back: Secondary | ICD-10-CM | POA: Diagnosis not present

## 2018-11-25 MED ORDER — KETOROLAC TROMETHAMINE 60 MG/2ML IM SOLN
60.0000 mg | Freq: Once | INTRAMUSCULAR | Status: AC
  Administered 2018-11-25: 60 mg via INTRAMUSCULAR

## 2018-11-25 MED ORDER — METHYLPREDNISOLONE ACETATE 40 MG/ML IJ SUSP
40.0000 mg | Freq: Once | INTRAMUSCULAR | Status: AC
  Administered 2018-11-25: 40 mg via INTRAMUSCULAR

## 2018-11-25 MED ORDER — OXYCODONE-ACETAMINOPHEN 5-325 MG PO TABS: 1.0000 | ORAL_TABLET | Freq: Three times a day (TID) | ORAL | 0 refills | Status: DC | PRN

## 2018-11-25 MED ORDER — PREDNISONE 10 MG (21) PO TBPK: ORAL_TABLET | ORAL | 0 refills | Status: DC

## 2018-11-25 NOTE — Patient Instructions (Signed)

## 2018-11-25 NOTE — Progress Notes (Signed)
Subjective:    Patient ID: Debra Hogan, female    DOB: Sep 29, 1961, 57 y.o.   MRN: [id number]  HPI  Patient presents to clinic c/o low back pain since last week.  Patient states she was at her mother's house and they had to remove some pieces of wood from around the shed.  Patient states the pain is mainly in the left lower side of her back.  Patient states the pain will radiate across to right side as well and seems to radiate down both her legs.  Denies any lower extremity numbness, denies saddle anesthesia, denies loss of bowel or bladder control.  Patient states that she has used some muscle relaxer she had at home, and has taken some ibuprofen without any effect and pain control.  Patient does report a history of low back issues including degenerative disc disease, slipped disc and she also has multiple sclerosis.  Patient Active Problem List   Diagnosis Date Noted  . Fatigue 10/21/2018  . Encounter for smoking cessation counseling 10/21/2018  . Anxiety 10/21/2018  . Left wrist pain 08/11/2018  . HTN (hypertension) 08/11/2018  . Atherosclerosis of aorta (HCC) 05/08/2018  . Elevated blood pressure reading 05/08/2018  . Rash 04/19/2018  . Soft tissue mass 07/17/2017  . Right arm pain 06/29/2017  . Palpitations 05/07/2017  . Cerebral aneurysm 05/01/2017  . HLD (hyperlipidemia) 04/05/2017  . Abdominal pain   . Multiple sclerosis (HCC)   . Esophageal reflux 10/21/2015  . COPD, moderate (HCC) 02/05/2015  . Generalized anxiety disorder 12/03/2014  . Routine physical examination 09/19/2013  . Lung nodule < 6cm on CT 05/22/2013  . Fibromyalgia 10/21/2012  . Genital herpes 10/21/2012  . Acute left-sided low back pain without sciatica 10/21/2012  . Hypothyroidism 10/21/2012   Social History   Tobacco Use  . Smoking status: Current Every Day Smoker    Packs/day: 1.00    Years: 43.00    Pack years: 43.00  . Smokeless tobacco: Never Used  Substance Use Topics  . Alcohol use: No      Alcohol/week: 0.0 standard drinks   Review of Systems  Constitutional: Negative for chills, fatigue and fever.  HENT: Negative for congestion, ear pain, sinus pain and sore throat.   Eyes: Negative.   Respiratory: Negative for cough, shortness of breath and wheezing.   Cardiovascular: Negative for chest pain, palpitations and leg swelling.  Gastrointestinal: Negative for abdominal pain, diarrhea, nausea and vomiting.  Genitourinary: Negative for dysuria, frequency and urgency.  Musculoskeletal: +low back pain, left more than right side Skin: Negative for color change, pallor and rash.  Neurological: Negative for syncope, light-headedness and headaches.  Psychiatric/Behavioral: The patient is not nervous/anxious.       Objective:   Physical Exam  Constitutional: She is oriented to person, place, and time. She appears well-nourished.  Patient appears very uncomfortable.  When I enter the room she is standing up pacing back and forth holding her left low back.  HENT:  Head: Normocephalic and atraumatic.  Eyes: Pupils are equal, round, and reactive to light. Conjunctivae and EOM are normal. No scleral icterus.  Neck: Normal range of motion. Neck supple. No tracheal deviation present.  Cardiovascular: Normal rate and regular rhythm.  Pulmonary/Chest: Effort normal and breath sounds normal. No respiratory distress.  Abdominal: Soft. Bowel sounds are normal. She exhibits no distension. There is no tenderness.  Musculoskeletal:       Back:  Tenderness across bilateral lower back/paraspinal muscles.  Left side is  more tender than right.  Patient has difficult time twisting and leaning towards the left, this motion causes pain to catch and makes the motion difficult to do.  Neurological: She is alert and oriented to person, place, and time.  Gait normal.  Grips equal and strong.  Quadricep strength equal and strong.  Dorsi plantar flexion equal and strong.  Skin: Skin is warm and dry.  Capillary refill takes less than 2 seconds. No rash noted. She is not diaphoretic. No erythema. No pallor.  Psychiatric: She has a normal mood and affect. Her behavior is normal.  Nursing note and vitals reviewed.  Vitals:   11/25/18 1336  BP: 112/60  Pulse: 86  Temp: 98.4 F (36.9 C)  SpO2: 90%      Assessment & Plan:   Acute bilateral low back pain/muscle spasms of back- patient given Toradol and methylprednisolone IM in clinic x1 to help her acute pain.  We will also get x-ray of lumbar spine to further investigate pain and see if further imaging possibly could be needed in future such as MRI.  Patient given handout outlining back exercises she can do to stretch out muscles and low back.  Patient will take a steroid taper and also patient given 10 tablets of Percocet to use for very severe pain.  Percocet was given due to patient not having any pain relief with high-dose ibuprofen and a muscle relaxer she has taken already at home.  Patient aware that Percocet is not going to be a long-term prescription is only for her current acute pain.  [facility] PMP registry checked and is appropriate for this medication prescription.  Administrations This Visit    ketorolac (TORADOL) injection 60 mg    Admin Date 11/25/2018 Action Given Dose 60 mg Route Intramuscular Administered By Clearnce Sorrel, RMA       methylPREDNISolone acetate (DEPO-MEDROL) injection 40 mg    Admin Date 11/25/2018 Action Given Dose 40 mg Route Intramuscular Administered By Clearnce Sorrel, RMA           Depending on x-ray results, we discussed possible referral to physical therapy for low back pain treatment.  Once we have x-ray results back we will better be able to determine next step in plan of care.  Patient will keep regularly scheduled follow-up with PCP as planned.  Advised to return to clinic if symptoms are not improving over the next 2 weeks.

## 2018-11-26 ENCOUNTER — Telehealth: Payer: Self-pay | Admitting: Family

## 2018-11-26 ENCOUNTER — Ambulatory Visit: Payer: Medicare Other | Admitting: Internal Medicine

## 2018-11-26 ENCOUNTER — Encounter: Payer: Self-pay | Admitting: Internal Medicine

## 2018-11-26 ENCOUNTER — Encounter: Payer: Self-pay | Admitting: Family Medicine

## 2018-11-26 VITALS — BP 128/78 | HR 87 | Resp 16 | Ht 65.0 in | Wt 162.0 lb

## 2018-11-26 DIAGNOSIS — R0602 Shortness of breath: Secondary | ICD-10-CM | POA: Diagnosis not present

## 2018-11-26 DIAGNOSIS — J449 Chronic obstructive pulmonary disease, unspecified: Secondary | ICD-10-CM | POA: Diagnosis not present

## 2018-11-26 NOTE — Patient Instructions (Signed)
--  Quitting smoking is the most important thing that you can do for your health.  --Quitting smoking will have greater affect on your health than any medicine that we can give you.   Continue advair and albuterol.

## 2018-11-26 NOTE — Telephone Encounter (Signed)
Copied from CRM [id number]. Topic: Quick Communication - Lab Results (Clinic Use ONLY) >> Nov 26, 2018 11:54 AM Clearnce Sorrel, RMA wrote: Called patient to inform them of 11/25/2018 lab results. When patient returns call, triage nurse [place] disclose results.

## 2018-11-26 NOTE — Progress Notes (Signed)
[facility] [place] [facility] Medicine Consultation      Assessment and Plan:  Emphysema with dyspnea on exertion. -FEV1 equals 52% consistent with moderate to severe obstructive lung disease. - Emphysematous changes seen on low-dose CT chest. -She continues to have dyspnea likely multifactorial from COPD/emphysema, as well as debility/deconditioning associated with the patient's underlying multiple sclerosis. - Continue Advair and albuterol.. - Discussed the importance of smoking cessation, and that her respiratory status will continue to decline. --She is enrolled in lung cancer screening, repeat CT due in 04/2019.  --PPSV23 05/13/18;  Obstructive sleep apnea. - Given his CPAP about 8 years ago, stopped using after 3 months due to change of insurance.  He is not interested in restarting at this time as she "hated it".  Nicotine abuse. -Discussed importance of smoke cessation, spent 3 minutes in discussion. She has tried chantix but her husband smokes so she was not able to quit.   Return in about 6 months (around 05/28/2019).   Date: 11/26/2018  MRN# [id number] Markesha Hannig [place] [birth year]   Cale D Hennick is a 57 y.o. old female seen in consultation for chief complaint of:    Chief Complaint  Patient presents with  . Shortness of Breath     occasional with exertion and wheezing and chest tightness    HPI:  The patient is a 57 year old female with a history of multiple sclerosis, brain aneurysm.  She is seen in our clinic for COPD/emphysema, maintained on Advair, pro-air.  She has been using advair twice daily, and feels like it is helping. She uses proair, most days and also helps.  She has been on spiriva in the past but caused a metallic taste.  She continues to smoking, she did the chantix but was not able to quit because her husband quit.    She worked in [employer], fashion/clothing. She is sleepy during the day.  She denies reflux, has sinus drainage and takes flonase which helps.  She snores at  night. She was diagnosed with OSA about 8 years ago, then she lost her insurance after 3 months and had to give it back. She "hated" it.   **Desat walk 08/20/18>> at rest on RA sat is 94% and HR is 90. She walked 180 feet at moderate pace without dyspnea. Her sat was 91% at the end.  **Spirometry 08/20/2018>> tracings personally reviewed, FVC is 69% predicted, FEV1 is 52% predicted, ratio is 58%. - Overall this test shows moderate to severe obstructive lung disease with mild restriction.  **Low-dose CT 04/30/2018>> apical emphysematous changes.  Per report there a few tiny sub-4 mm nodules in both lungs. **CO2 08/09/2018>> 34, slightly elevated **CBC 08/09/2018>> absolute eosinophil count 100   Medication:    Current Outpatient Medications:  .  acyclovir (ZOVIRAX) 400 MG tablet, Take 1 tablet (400 mg total) by mouth daily. (Patient taking differently: Take 400 mg by mouth at bedtime. ), Disp: 90 tablet, Rfl: 3 .  Albuterol Sulfate (PROAIR RESPICLICK) 108 (90 Base) MCG/ACT AEPB, Inhale 90 mcg into the lungs every 6 (six) hours as needed (as needed for cough, wheezing). (Patient taking differently: Inhale 180 mcg into the lungs every 6 (six) hours as needed (as needed for cough, wheezing). ), Disp: 1 each, Rfl: 2 .  aspirin 81 MG tablet, Take 81 mg by mouth daily., Disp: , Rfl:  .  baclofen (LIORESAL) 10 MG tablet, Take 1 tablet (10 mg total) by mouth 3 (three) times daily as needed for muscle spasms., Disp: 30 each, Rfl:  4 .  carbamazepine (TEGRETOL) 200 MG tablet, Take 200 mg by mouth 2 (two) times daily., Disp: , Rfl:  .  cholecalciferol (VITAMIN D) 1000 units tablet, Take 1,000 Units by mouth daily., Disp: , Rfl:  .  diclofenac sodium (VOLTAREN) 1 % GEL, Apply 4 g topically 4 (four) times daily., Disp: 1 Tube, Rfl: 3 .  Dimethyl Fumarate 240 MG CPDR, Take 240 mg by mouth 2 (two) times daily., Disp: , Rfl:  .  escitalopram (LEXAPRO) 20 MG tablet, Take 1 tablet (20 mg total) by mouth daily., Disp: 90  tablet, Rfl: 1 .  Fluticasone-Salmeterol (ADVAIR DISKUS) 100-50 MCG/DOSE AEPB, Inhale 1 puff into the lungs 2 (two) times daily as needed (for shortness of breath)., Disp: 60 each, Rfl: 3 .  lisinopril (PRINIVIL,ZESTRIL) 10 MG tablet, Take 1 tablet (10 mg total) by mouth daily., Disp: 90 tablet, Rfl: 4 .  Multiple Vitamins-Minerals (CENTRUM SILVER ULTRA WOMENS) TABS, Take 1 tablet by mouth daily., Disp: , Rfl:  .  ondansetron (ZOFRAN-ODT) 4 MG disintegrating tablet, Take 1 tablet (4 mg total) by mouth every 6 (six) hours as needed for nausea or vomiting., Disp: 20 tablet, Rfl: 1 .  oxyCODONE-acetaminophen (PERCOCET/ROXICET) 5-325 MG tablet, Take 1 tablet by mouth every 8 (eight) hours as needed for severe pain., Disp: 10 tablet, Rfl: 0 .  predniSONE (STERAPRED UNI-PAK 21 TAB) 10 MG (21) TBPK tablet, Take according to pack instructions, Disp: 21 tablet, Rfl: 0 .  rosuvastatin (CRESTOR) 10 MG tablet, Take 1 tablet (10 mg total) by mouth daily., Disp: 90 tablet, Rfl: 3 .  varenicline (CHANTIX PAK) 0.5 MG X 11 & 1 MG X 42 tablet, Take one 0.5 mg tablet by mouth once daily for 3 days, then increase to one 0.5 mg tablet twice daily for 4 days, then increase to one 1 mg tablet twice daily., Disp: 53 tablet, Rfl: 0   Allergies:  Valium [diazepam]  Review of Systems:  Constitutional: Feels well. Cardiovascular: Denies chest pain, exertional chest pain.  Pulmonary: Denies hemoptysis, pleuritic chest pain.   The remainder of systems were reviewed and were found to be negative other than what is documented in the HPI.    Physical Examination:   VS: BP 128/78 (BP Location: Left Arm, Cuff Size: Normal)   Pulse 87   Resp 16   Ht 5\' 5"  (1.651 m)   Wt 162 lb (73.5 kg)   SpO2 97%   BMI 26.96 kg/m   General Appearance: No distress  Neuro:without focal findings, mental status, speech normal, alert and oriented HEENT: PERRLA, EOM intact Pulmonary: No wheezing, No rales  CardiovascularNormal S1,S2.  No  m/r/g.  Abdomen: Benign, Soft, non-tender, No masses Renal:  No costovertebral tenderness  GU:  No performed at this time. Endoc: No evident thyromegaly, no signs of acromegaly or Cushing features Skin:   warm, no rashes, no ecchymosis  Extremities: normal, no cyanosis, clubbing.      LABORATORY PANEL:   CBC No results for input(s): WBC, HGB, HCT, PLT in the last 168 hours. ------------------------------------------------------------------------------------------------------------------  Chemistries  No results for input(s): NA, K, CL, CO2, GLUCOSE, BUN, CREATININE, CALCIUM, MG, AST, ALT, ALKPHOS, BILITOT in the last 168 hours.  Invalid input(s): GFRCGP ------------------------------------------------------------------------------------------------------------------  Cardiac Enzymes No results for input(s): TROPONINI in the last 168 hours. ------------------------------------------------------------  RADIOLOGY:  Dg Lumbar Spine Complete  Result Date: 11/25/2018 CLINICAL DATA:  Acute lower back pain. EXAM: LUMBAR SPINE - COMPLETE 4+ VIEW COMPARISON:  Radiographs of March 29, 2018.  FINDINGS: Stable mild dextroscoliosis of lumbar spine is noted. No fracture or spondylolisthesis is noted. Severe degenerative disc disease is noted at L1-2 and L3-4. No fracture or significant spondylolisthesis is noted. IMPRESSION: Severe multilevel degenerative disc disease. No acute abnormality seen in the lumbar spine. Electronically Signed   By: Lupita Raider, M.D.   On: 11/25/2018 18:10       Thank  you for the consultation and for allowing [facility] to assist in the care of your patient. Our recommendations are noted above.  Please contact us if we can be of further service.   Wells Guiles, M.D., F.C.C.P.  Board Certified in Internal Medicine, Pulmonary Medicine, Critical Care Medicine, and Sleep Medicine.  [facility] Office Number:  [phone]   11/26/2018

## 2018-11-26 NOTE — Telephone Encounter (Signed)
Attempted to call patient back to provide lab rseults.  No answer /

## 2018-11-27 ENCOUNTER — Telehealth: Payer: Self-pay | Admitting: *Deleted

## 2018-11-27 NOTE — Telephone Encounter (Signed)
I see where referral was placed in September. I am sending this as an FYI since I see no documentation of where anyone from our office called patient.

## 2018-11-27 NOTE — Telephone Encounter (Signed)
Noted  

## 2018-11-27 NOTE — Telephone Encounter (Signed)
Copied from CRM [id number]. Topic: General - Other >> Nov 27, 2018  1:29 PM Crist Infante wrote: Reason for CRM: pt states she spoke with someone earlier about getting a referral to Physical therapy, but I do not see anything. Pt states she wants to cancel that request.

## 2018-11-28 ENCOUNTER — Ambulatory Visit: Payer: Medicare Other | Admitting: Cardiovascular Disease

## 2018-11-28 ENCOUNTER — Encounter: Payer: Self-pay | Admitting: Cardiovascular Disease

## 2018-11-28 VITALS — BP 146/82 | HR 92 | Ht 65.0 in | Wt 159.5 lb

## 2018-11-28 DIAGNOSIS — Z720 Tobacco use: Secondary | ICD-10-CM

## 2018-11-28 DIAGNOSIS — R0602 Shortness of breath: Secondary | ICD-10-CM | POA: Diagnosis not present

## 2018-11-28 DIAGNOSIS — R079 Chest pain, unspecified: Secondary | ICD-10-CM

## 2018-11-28 NOTE — Progress Notes (Signed)
Cardiology Office Note   Date:  11/28/2018   ID:  Debra Hogan, DOB 07/28/1961, MRN [id number]  PCP:  Allegra Grana, FNP  Cardiologist:   Lorine Bears, MD   Chief Complaint  Patient presents with  . New Patient (Initial Visit)    Arm pain?, Hx with KC.Pt has MS an Fibromylgia Medications reviewed verbally.       History of Present Illness: Debra Hogan is a 57 y.o. female who was referred by Rennie Plowman for evaluation of pericardial effusion and chest pain.  The patient has no previous cardiac history but was evaluated by Dr. Windell Norfolk in 2015 for presyncope.  Echocardiogram showed an EF of 50% with moderate tricuspid regurgitation.  The patient has chronic medical conditions that include fibromyalgia, multiple sclerosis, tobacco use, COPD, degenerative disc disease with chronic back pain and hyperlipidemia.  She has family history of coronary artery disease and she is a smoker. She had CT scan of the lungs in [place] 2019 which shows small pericardial effusion.  She was supposed to get an echocardiogram done but did not show up. She complains of intermittent substernal chest tightness typically twice a week that lasts for few minutes.  This happens at rest or with exertion.  She thinks is related to her COPD.    Past Medical History:  Diagnosis Date  . Arthritis   . COPD (chronic obstructive pulmonary disease) (HCC)   . Depression   . Fibromyalgia   . Irritable bowel syndrome   . MS (multiple sclerosis) (HCC)    Followed by Dr. Sherryll Burger    Past Surgical History:  Procedure Laterality Date  . ABDOMINAL HYSTERECTOMY     CERVIX intact  . CHOLECYSTECTOMY N/A 01/03/2016   Procedure: LAPAROSCOPIC CHOLECYSTECTOMY WITH INTRAOPERATIVE CHOLANGIOGRAM;  Surgeon: Lattie Haw, MD;  Location: [facility] ORS;  Service: General;  Laterality: N/A;  . ENDOSCOPIC RETROGRADE CHOLANGIOPANCREATOGRAPHY (ERCP) WITH PROPOFOL N/A 01/04/2016   Procedure: ENDOSCOPIC RETROGRADE  CHOLANGIOPANCREATOGRAPHY (ERCP) WITH PROPOFOL;  Surgeon: Midge Minium, MD;  Location: [facility] ENDOSCOPY;  Service: Endoscopy;  Laterality: N/A;  . FOOT SURGERY  2001  . LAPAROSCOPIC ENDOMETRIOSIS FULGURATION       Current Outpatient Medications  Medication Sig Dispense Refill  . acyclovir (ZOVIRAX) 400 MG tablet Take 1 tablet (400 mg total) by mouth daily. (Patient taking differently: Take 400 mg by mouth at bedtime. ) 90 tablet 3  . Albuterol Sulfate (PROAIR RESPICLICK) 108 (90 Base) MCG/ACT AEPB Inhale 90 mcg into the lungs every 6 (six) hours as needed (as needed for cough, wheezing). (Patient taking differently: Inhale 180 mcg into the lungs every 6 (six) hours as needed (as needed for cough, wheezing). ) 1 each 2  . aspirin 81 MG tablet Take 81 mg by mouth daily.    . baclofen (LIORESAL) 10 MG tablet Take 1 tablet (10 mg total) by mouth 3 (three) times daily as needed for muscle spasms. 30 each 4  . carbamazepine (TEGRETOL) 200 MG tablet Take 200 mg by mouth 2 (two) times daily.    . cholecalciferol (VITAMIN D) 1000 units tablet Take 1,000 Units by mouth daily.    . diclofenac sodium (VOLTAREN) 1 % GEL Apply 4 g topically 4 (four) times daily. 1 Tube 3  . Dimethyl Fumarate 240 MG CPDR Take 240 mg by mouth 2 (two) times daily.    Marland Kitchen escitalopram (LEXAPRO) 20 MG tablet Take 1 tablet (20 mg total) by mouth daily. 90 tablet 1  . Fluticasone-Salmeterol (ADVAIR DISKUS)  100-50 MCG/DOSE AEPB Inhale 1 puff into the lungs 2 (two) times daily as needed (for shortness of breath). 60 each 3  . lisinopril (PRINIVIL,ZESTRIL) 10 MG tablet Take 1 tablet (10 mg total) by mouth daily. 90 tablet 4  . Multiple Vitamins-Minerals (CENTRUM SILVER ULTRA WOMENS) TABS Take 1 tablet by mouth daily.    . ondansetron (ZOFRAN-ODT) 4 MG disintegrating tablet Take 1 tablet (4 mg total) by mouth every 6 (six) hours as needed for nausea or vomiting. 20 tablet 1  . oxyCODONE-acetaminophen (PERCOCET/ROXICET) 5-325 MG tablet Take 1  tablet by mouth every 8 (eight) hours as needed for severe pain. 10 tablet 0  . predniSONE (STERAPRED UNI-PAK 21 TAB) 10 MG (21) TBPK tablet Take according to pack instructions 21 tablet 0  . rosuvastatin (CRESTOR) 10 MG tablet Take 1 tablet (10 mg total) by mouth daily. 90 tablet 3  . varenicline (CHANTIX PAK) 0.5 MG X 11 & 1 MG X 42 tablet Take one 0.5 mg tablet by mouth once daily for 3 days, then increase to one 0.5 mg tablet twice daily for 4 days, then increase to one 1 mg tablet twice daily. 53 tablet 0   No current facility-administered medications for this visit.     Allergies:   Valium [diazepam]    Social History:  The patient  reports that she has been smoking. She has a 43.00 pack-year smoking history. She has never used smokeless tobacco. She reports that she does not drink alcohol or use drugs.   Family History:  The patient's family history includes Arthritis in her mother; Bipolar disorder in her son; Cancer in her father, maternal grandfather, and paternal grandmother; Heart disease in her brother; Hyperlipidemia in her mother; Hypertension in her mother; Schizophrenia in her son.    ROS:  Please see the history of present illness.   Otherwise, review of systems are positive for none.   All other systems are reviewed and negative.    PHYSICAL EXAM: VS:  BP (!) 146/82 (BP Location: Right Arm, Patient Position: Sitting, Cuff Size: Normal)   Pulse 92   Ht 5\' 5"  (1.651 m)   Wt 159 lb 8 oz (72.3 kg)   BMI 26.54 kg/m  , BMI Body mass index is 26.54 kg/m. GEN: Well nourished, well developed, in no acute distress  HEENT: normal  Neck: no JVD, carotid bruits, or masses Cardiac: RRR; no murmurs, rubs, or gallops,no edema  Respiratory:  clear to auscultation bilaterally, normal work of breathing GI: soft, nontender, nondistended, + BS MS: no deformity or atrophy  Skin: warm and dry, no rash Neuro:  Strength and sensation are intact Psych: euthymic mood, full  affect   EKG:  EKG is ordered today. The ekg ordered today demonstrates normal sinus rhythm with possible left atrial enlargement   Recent Labs: 08/09/2018: ALT 13; BUN 14; Creatinine, Ser 0.64; Hemoglobin 11.9; Platelets 214.0; Potassium 3.7; Sodium 145; TSH 3.87    Lipid Panel    Component Value Date/Time   CHOL 303 (H) 05/08/2018 1147   TRIG 361.0 (H) 05/08/2018 1147   HDL 51.70 05/08/2018 1147   CHOLHDL 6 05/08/2018 1147   VLDL 72.2 (H) 05/08/2018 1147   LDLCALC 136 (H) 09/19/2013 1640   LDLDIRECT 169.0 05/08/2018 1147      Wt Readings from Last 3 Encounters:  11/28/18 159 lb 8 oz (72.3 kg)  11/26/18 162 lb (73.5 kg)  11/25/18 159 lb 12.8 oz (72.5 kg)       PAD Screen  11/28/2018  Previous PAD dx? Yes  Previous surgical procedure? No  Pain with walking? Yes  Subsides with rest? No  Feet/toe relief with dangling? Yes  Painful, non-healing ulcers? No  Extremities discolored? No      ASSESSMENT AND PLAN:  1.  Atypical chest pain with multiple risk factors for coronary artery disease: I recommend evaluation with a Lexiscan Myoview.  She is not able to exercise on a treadmill due to chronic back pain.  2.  Exertional dyspnea with previous small pericardial effusion on CT scan coronary requested an echocardiogram for evaluation.  3.  Tobacco use: I discussed the importance of smoking cessation.    Disposition:   FU with me  As needed  Signed,  Lorine Bears, MD  11/28/2018 2:16 PM    [facility]

## 2018-11-28 NOTE — Patient Instructions (Signed)
Medication Instructions:  No changes  If you need a refill on your cardiac medications before your next appointment, please call your pharmacy.   Lab work: None ordered  Testing/Procedures: Your physician has requested that you have an echocardiogram. Echocardiography is a painless test that uses sound waves to create images of your heart. It provides your doctor with information about the size and shape of your heart and how well your heart's chambers and valves are working. You Debra Hogan receive an ultrasound enhancing agent through an IV if needed to better visualize your heart during the echo.This procedure takes approximately one hour. There are no restrictions for this procedure. This will take place at the [facility] clinic.   Your physician has requested that you have a lexiscan myoview. For further information please visit [email or web address] Please follow instruction sheet, as given.   Follow-Up: Dr Debra Hogan would like for you to follow up as needed.  Any Other Special Instructions Will Be Listed Below (If Applicable).  [facility] MYOVIEW  Your provider has ordered a Stress Test with nuclear imaging. The purpose of this test is to evaluate the blood supply to your heart muscle. This procedure is referred to as a "Non-Invasive Stress Test." This is because other than having an IV started in your vein, nothing is inserted or "invades" your body. Cardiac stress tests are done to find areas of poor blood flow to the heart by determining the extent of coronary artery disease (CAD). Some patients exercise on a treadmill, which naturally increases the blood flow to your heart, while others who are unable to walk on a treadmill due to physical limitations have a pharmacologic/chemical stress agent called Lexiscan . This medicine will mimic walking on a treadmill by temporarily increasing your coronary blood flow.   Please note: these test Walle take anywhere between 2-4 hours to complete  PLEASE  REPORT TO [facility]  THE VOLUNTEERS AT THE FIRST DESK WILL DIRECT YOU WHERE TO GO  Date of Procedure:_____________________________________  Arrival Time for Procedure:______________________________  Instructions regarding medication:  None to hold  PLEASE NOTIFY THE OFFICE AT LEAST 24 HOURS IN ADVANCE IF YOU ARE UNABLE TO KEEP YOUR APPOINTMENT.  [phone] AND  PLEASE NOTIFY NUCLEAR MEDICINE AT [facility] AT LEAST 24 HOURS IN ADVANCE IF YOU ARE UNABLE TO KEEP YOUR APPOINTMENT. [phone]  How to prepare for your Myoview test:  1. Do not eat or drink after midnight 2. No caffeine for 24 hours prior to test 3. No smoking 24 hours prior to test. 4. Your medication Caltagirone be taken with water.  If your doctor stopped a medication because of this test, do not take that medication. 5. Ladies, please do not wear dresses.  Skirts or pants are appropriate. Please wear a short sleeve shirt. 6. No perfume, cologne or lotion. 7. Wear comfortable walking shoes. No heels!

## 2018-12-02 NOTE — Unmapped (Signed)
[facility] Refill Coordination Note  Medication: TECFIDERA    Unable to reach patient to schedule shipment for medication being filled at [facility]. Left voicemail on phone.  As this is the 3rd unsuccessful attempt to reach the patient, no additional phone call attempts will be made at this time.      Phone numbers attempted: [phone]  Last scheduled delivery: SHIPPED 10/29/18    Please call the [facility] at [phone] (option 4) should you have any further questions.      Thanks,  [facility] Specialty Team

## 2018-12-03 ENCOUNTER — Encounter
Admission: RE | Admit: 2018-12-03 | Discharge: 2018-12-03 | Disposition: A | Discharge status: Home / Self Care | Payer: Medicare Other | Source: Ambulatory Visit | Attending: Cardiovascular Disease | Admitting: Cardiovascular Disease

## 2018-12-03 DIAGNOSIS — R079 Chest pain, unspecified: Secondary | ICD-10-CM | POA: Diagnosis present

## 2018-12-03 LAB — NM MYOCAR MULTI W/SPECT W/WALL MOTION / EF
CHL CUP MPHR: 161 {beats}/min
CSEPED: 0 min
CSEPEDS: 0 s
CSEPHR: 65 %
Estimated workload: 1 METS
LV sys vol: 29 mL
LVDIAVOL: 55 mL (ref 46–106)
Peak HR: 106 {beats}/min
Rest HR: 68 {beats}/min
SDS: 0
SRS: 0
SSS: 0
TID: 1.15

## 2018-12-03 MED ORDER — TECHNETIUM TC 99M TETROFOSMIN IV KIT
30.0000 | PACK | Freq: Once | INTRAVENOUS | Status: AC | PRN
  Administered 2018-12-03: 30.63 via INTRAVENOUS

## 2018-12-03 MED ORDER — REGADENOSON 0.4 MG/5ML IV SOLN
0.4000 mg | Freq: Once | INTRAVENOUS | Status: AC
  Administered 2018-12-03: 0.4 mg via INTRAVENOUS

## 2018-12-03 MED ORDER — TECHNETIUM TC 99M TETROFOSMIN IV KIT
11.1300 | PACK | Freq: Once | INTRAVENOUS | Status: AC | PRN
  Administered 2018-12-03: 11.13 via INTRAVENOUS

## 2018-12-03 NOTE — Unmapped (Signed)
[facility] Pharm[facility] Refill Coordination Note  Specialty Medication(s): Tecfidera 240mg   Additional Medications shipped: N/A    Jennifer Lewis, DOB: [birth year]  Phone: [phone] (home) , Alternate phone contact: N/A  Phone or address changes today?: No  All above HIPAA information was verified with patient.  Shipping Address: [address]  [place] [address]   Insurance changes? No    Completed refill call assessment today to schedule patient's medication shipment from the [facility] [phone]).      Confirmed the medication and dosage are correct and have not changed: Yes, regimen is correct and unchanged.    Confirmed patient started or stopped the following medications in the past month:  Yes. [place] reports starting the following medications: Lisinopril    Are you tolerating your medication?:  [name] reports tolerating the medication.    ADHERENCE    (Below is required for Medicare Part B or Transplant patients only - per drug):   How many tablets were dispensed last month: 60  Patient currently has 4 days of medication remaining.    Did you miss any doses in the past 4 weeks? Jennifer Lewis reports missing 1 capsle due to waking up late so she was only able to take 1 capsule instead of 2 capsules    FINANCIAL/SHIPPING    Delivery Scheduled: Yes, Expected medication delivery date: 12/05/18     Medication will be delivered via Next Day Courier to the home address in [place].    The patient will receive a drug information handout for each medication shipped and additional FDA Medication Guides as required.      [name] did not have any additional questions at this time.    We will follow up with patient monthly for standard refill processing and delivery.      Thank you,  Jasper Loser   [facility] Shared [facility] Specialty Technician

## 2018-12-04 MED FILL — TECFIDERA 240 MG CAPSULE,DELAYED RELEASE: ORAL | 30 days supply | Qty: 60 | Fill #3

## 2018-12-04 MED FILL — TECFIDERA 240 MG CAPSULE,DELAYED RELEASE: 30 days supply | Qty: 60 | Fill #3 | Status: AC

## 2018-12-23 ENCOUNTER — Ambulatory Visit (INDEPENDENT_AMBULATORY_CARE_PROVIDER_SITE_OTHER): Payer: Medicare Other

## 2018-12-23 DIAGNOSIS — R0602 Shortness of breath: Secondary | ICD-10-CM

## 2018-12-23 NOTE — Unmapped (Signed)
LVM with Neurosurgery nurse contact information, requesting a call back.

## 2018-12-23 NOTE — Unmapped (Signed)
Wrong number.

## 2018-12-24 NOTE — Unmapped (Signed)
Invalid phone number.

## 2018-12-24 NOTE — Unmapped (Signed)
LVM with Neurosurgery nurse contact information, requesting a call back.

## 2018-12-26 NOTE — Unmapped (Signed)
LVM informed patient office does not complete disability paper work and patient will need to contact Neurologist for refill of tegretol.

## 2018-12-26 NOTE — Unmapped (Signed)
Faxed refill request received from patient:    Provider: Dr. Johnnye Lana    Last Visit Date: 01/02/2018     Next Visit Date: 05/13/2019

## 2018-12-26 NOTE — Unmapped (Signed)
Pt faxed form and mistakenly put to the attention of Dr. Tresa Garter.  It needs to go to Dr. Johnnye Lana.

## 2018-12-26 NOTE — Unmapped (Signed)
Patient returning phone call 

## 2018-12-27 ENCOUNTER — Telehealth: Payer: Self-pay | Admitting: *Deleted

## 2018-12-27 MED ORDER — CARBAMAZEPINE 200 MG TABLET
ORAL_TABLET | Freq: Two times a day (BID) | ORAL | 0 refills | 0.00000 days | Status: CP
Start: 2018-12-27 — End: 2018-12-27

## 2018-12-27 MED ORDER — CARBAMAZEPINE 200 MG TABLET: 200 mg | tablet | Freq: Two times a day (BID) | 0 refills | 0 days | Status: AC

## 2018-12-27 NOTE — Telephone Encounter (Signed)
Left a message for the patient to call back.  

## 2018-12-27 NOTE — Telephone Encounter (Signed)
-----   Message from Iran Ouch, MD sent at 12/26/2018  5:27 PM EST ----- Inform patient that echo showed normal ejection fraction.  There was evidence of moderate pulmonary hypertension which might be causing some of her shortness of breath.  Schedule her for a routine follow-up to discuss this.  If she continues to have significant dyspnea, we might need to evaluate with a right heart catheterization.

## 2018-12-27 NOTE — Unmapped (Signed)
Jennifer Lewis, patient returned your call.  She can be reached at [phone]

## 2018-12-27 NOTE — Unmapped (Signed)
-----   Message from Sarita Bottom, MD sent at 12/27/2018 12:35 AM EST -----  Received a refill request for Carbamazepine.     Last Visit Date: 01/02/2018.    Please call the patient: is she still taking carbamazepine and what dose?    Thanks,  Sharmon Revere

## 2018-12-27 NOTE — Unmapped (Signed)
Patient called back and she is taking Carbamazepine 200 mg po bid.    Will alert Dr. Johnnye Lana.

## 2018-12-27 NOTE — Unmapped (Signed)
[facility] Pharm[facility] Refill Coordination Note  Specialty Medication(s): Jennifer Lewis, DOB: [birth year]  Phone: [phone] (home) , Alternate phone contact: N/A  Phone or address changes today?: No  All above HIPAA information was verified with patient.  Shipping Address: [address]  [place] [address]   Insurance changes? No    Completed refill call assessment today to schedule patient's medication shipment from the [facility] [phone]).      Confirmed the medication and dosage are correct and have not changed: Yes, regimen is correct and unchanged.    Confirmed patient started or stopped the following medications in the past month:  No, there are no changes reported at this time.    Are you tolerating your medication?:  Jennifer Lewis reports tolerating the medication.    ADHERENCE    (Below is required for Medicare Part B or Transplant patients only - per drug):   How many tablets were dispensed last month: 30 DAYS  Patient currently has 4 DAYSremaining.    Did you miss any doses in the past 4 weeks? No missed doses reported.    FINANCIAL/SHIPPING    Delivery Scheduled: Yes, Expected medication delivery date: 12/30/2018     Medication will be delivered via Same Day Courier to the home address in [facility].    The patient will receive a drug information handout for each medication shipped and additional FDA Medication Guides as required.      Mikesha did not have any additional questions at this time.    We will follow up with patient monthly for standard refill processing and delivery.      Thank you,  Westley Gambles   [facility] Shared [facility] Specialty Technician

## 2018-12-27 NOTE — Unmapped (Signed)
Message left for patient to return my call. Need to clarify carbamazepine dosage.

## 2018-12-28 NOTE — Unmapped (Signed)
Refilled Carbamazepine 200 mg BID for 2 more months.  The patient needs a follow-up.    Sharmon Revere Dujmovic Basuroski

## 2018-12-30 MED FILL — TECFIDERA 240 MG CAPSULE,DELAYED RELEASE: ORAL | 30 days supply | Qty: 60 | Fill #4

## 2018-12-30 MED FILL — TECFIDERA 240 MG CAPSULE,DELAYED RELEASE: 30 days supply | Qty: 60 | Fill #4 | Status: AC

## 2018-12-30 NOTE — Telephone Encounter (Signed)
Patient returning call.

## 2018-12-30 NOTE — Telephone Encounter (Signed)
Patient verbalized understanding of results. She was scheduled to come in on 01/07/2019 for appointment with Dr Kirke Corin.

## 2018-12-30 NOTE — Unmapped (Signed)
Called pt regarding an in basket message from Dr. Johnnye Lana requesting that the pt's appt be moved to a sooner date. Pt is past due for her appt. A message has been left and a recall entered.AN

## 2019-01-03 ENCOUNTER — Ambulatory Visit: Payer: Medicare Other | Admitting: Cardiovascular Disease

## 2019-01-03 ENCOUNTER — Encounter: Payer: Self-pay | Admitting: Cardiovascular Disease

## 2019-01-03 VITALS — BP 150/88 | HR 80 | Ht 65.0 in | Wt 158.0 lb

## 2019-01-03 DIAGNOSIS — I2729 Other secondary pulmonary hypertension: Secondary | ICD-10-CM

## 2019-01-03 DIAGNOSIS — R0789 Other chest pain: Secondary | ICD-10-CM

## 2019-01-03 DIAGNOSIS — Z720 Tobacco use: Secondary | ICD-10-CM | POA: Diagnosis not present

## 2019-01-03 DIAGNOSIS — R0602 Shortness of breath: Secondary | ICD-10-CM | POA: Diagnosis not present

## 2019-01-03 NOTE — Patient Instructions (Signed)
Medication Instructions:  No changes If you need a refill on your cardiac medications before your next appointment, please call your pharmacy.   Lab work: None ordered  Testing/Procedures: None ordered  Follow-Up: At [facility], you and your health needs are our priority.  As part of our continuing mission to provide you with exceptional heart care, we have created designated Provider Care Teams.  These Care Teams include your primary Cardiologist (physician) and Advanced Practice Providers (APPs -  Physician Assistants and Nurse Practitioners) who all work together to provide you with the care you need, when you need it. You will need a follow up appointment in 6 months.  Please call our office 2 months in advance to schedule this appointment.  You Wilinski see Dr. Arida or one of the following Advanced Practice Providers on your designated Care Team:   Christopher Berge, NP Ryan Dunn, PA-C    

## 2019-01-03 NOTE — Progress Notes (Signed)
Cardiology Office Note   Date:  01/03/2019   ID:  Debra Hogan, DOB Oct 07, 1961, MRN [id number]  PCP:  Allegra Grana, FNP  Cardiologist:   Lorine Bears, MD   Chief Complaint  Patient presents with  . other    discuss echo results.CP comes and goes.  Medications reviewed verbally.       History of Present Illness: Debra Hogan is a 58 y.o. female who is here today for follow-up visit regarding atypical chest pain, shortness of breath and recently found moderate pulmonary hypertension.   The patient has chronic medical conditions that include fibromyalgia, multiple sclerosis, tobacco use, COPD, degenerative disc disease with chronic back pain and hyperlipidemia.  She has family history of coronary artery disease and she is a smoker.  She is on home oxygen at night. She had CT scan of the lungs in [place] 2019 which shows small pericardial effusion.   She was seen recently for intermittent atypical chest pain and exertional dyspnea.  She continues to smoke and has not been able to quit smoking.  She underwent cardiac evaluation which included a Lexiscan Myoview which showed no evidence of ischemia with normal ejection fraction. Echocardiogram showed normal LV systolic function with grade 1 diastolic dysfunction, normal RV systolic function and moderately elevated pulmonary pressure with estimated systolic pressure of 49 mmHg.  The patient suffers from chronic back pain which has worsened recently.  Past Medical History:  Diagnosis Date  . Arthritis   . COPD (chronic obstructive pulmonary disease) (HCC)   . Depression   . Fibromyalgia   . Irritable bowel syndrome   . MS (multiple sclerosis) (HCC)    Followed by Dr. Sherryll Burger    Past Surgical History:  Procedure Laterality Date  . ABDOMINAL HYSTERECTOMY     CERVIX intact  . CHOLECYSTECTOMY N/A 01/03/2016   Procedure: LAPAROSCOPIC CHOLECYSTECTOMY WITH INTRAOPERATIVE CHOLANGIOGRAM;  Surgeon: Lattie Haw, MD;  Location: [facility]  ORS;  Service: General;  Laterality: N/A;  . ENDOSCOPIC RETROGRADE CHOLANGIOPANCREATOGRAPHY (ERCP) WITH PROPOFOL N/A 01/04/2016   Procedure: ENDOSCOPIC RETROGRADE CHOLANGIOPANCREATOGRAPHY (ERCP) WITH PROPOFOL;  Surgeon: Midge Minium, MD;  Location: [facility] ENDOSCOPY;  Service: Endoscopy;  Laterality: N/A;  . FOOT SURGERY  2001  . LAPAROSCOPIC ENDOMETRIOSIS FULGURATION       Current Outpatient Medications  Medication Sig Dispense Refill  . acyclovir (ZOVIRAX) 400 MG tablet Take 1 tablet (400 mg total) by mouth daily. (Patient taking differently: Take 400 mg by mouth at bedtime. ) 90 tablet 3  . Albuterol Sulfate (PROAIR RESPICLICK) 108 (90 Base) MCG/ACT AEPB Inhale 90 mcg into the lungs every 6 (six) hours as needed (as needed for cough, wheezing). (Patient taking differently: Inhale 180 mcg into the lungs every 6 (six) hours as needed (as needed for cough, wheezing). ) 1 each 2  . aspirin 81 MG tablet Take 81 mg by mouth daily.    . baclofen (LIORESAL) 10 MG tablet Take 1 tablet (10 mg total) by mouth 3 (three) times daily as needed for muscle spasms. 30 each 4  . carbamazepine (TEGRETOL) 200 MG tablet Take 200 mg by mouth 2 (two) times daily.    . cholecalciferol (VITAMIN D) 1000 units tablet Take 1,000 Units by mouth daily.    . diclofenac sodium (VOLTAREN) 1 % GEL Apply 4 g topically 4 (four) times daily. 1 Tube 3  . Dimethyl Fumarate 240 MG CPDR Take 240 mg by mouth 2 (two) times daily.    Marland Kitchen escitalopram (LEXAPRO) 20  MG tablet Take 1 tablet (20 mg total) by mouth daily. 90 tablet 1  . Fluticasone-Salmeterol (ADVAIR DISKUS) 100-50 MCG/DOSE AEPB Inhale 1 puff into the lungs 2 (two) times daily as needed (for shortness of breath). 60 each 3  . lisinopril (PRINIVIL,ZESTRIL) 10 MG tablet Take 1 tablet (10 mg total) by mouth daily. 90 tablet 4  . Multiple Vitamins-Minerals (CENTRUM SILVER ULTRA WOMENS) TABS Take 1 tablet by mouth daily.    . ondansetron (ZOFRAN-ODT) 4 MG disintegrating tablet Take 1  tablet (4 mg total) by mouth every 6 (six) hours as needed for nausea or vomiting. 20 tablet 1  . oxyCODONE-acetaminophen (PERCOCET/ROXICET) 5-325 MG tablet Take 1 tablet by mouth every 8 (eight) hours as needed for severe pain. 10 tablet 0  . predniSONE (STERAPRED UNI-PAK 21 TAB) 10 MG (21) TBPK tablet Take according to pack instructions 21 tablet 0  . rosuvastatin (CRESTOR) 10 MG tablet Take 1 tablet (10 mg total) by mouth daily. 90 tablet 3  . varenicline (CHANTIX PAK) 0.5 MG X 11 & 1 MG X 42 tablet Take one 0.5 mg tablet by mouth once daily for 3 days, then increase to one 0.5 mg tablet twice daily for 4 days, then increase to one 1 mg tablet twice daily. 53 tablet 0   No current facility-administered medications for this visit.     Allergies:   Valium [diazepam]    Social History:  The patient  reports that she has been smoking. She has a 43.00 pack-year smoking history. She has never used smokeless tobacco. She reports that she does not drink alcohol or use drugs.   Family History:  The patient's family history includes Arthritis in her mother; Bipolar disorder in her son; Cancer in her father, maternal grandfather, and paternal grandmother; Heart disease in her brother; Hyperlipidemia in her mother; Hypertension in her mother; Schizophrenia in her son.    ROS:  Please see the history of present illness.   Otherwise, review of systems are positive for none.   All other systems are reviewed and negative.    PHYSICAL EXAM: VS:  BP (!) 150/88 (BP Location: Left Arm, Patient Position: Sitting, Cuff Size: Normal)   Pulse 80   Ht 5\' 5"  (1.651 m)   Wt 158 lb (71.7 kg)   BMI 26.29 kg/m  , BMI Body mass index is 26.29 kg/m. GEN: Well nourished, well developed, in no acute distress  HEENT: normal  Neck: no JVD, carotid bruits, or masses Cardiac: RRR; no murmurs, rubs, or gallops,no edema  Respiratory:  clear to auscultation bilaterally, normal work of breathing GI: soft, nontender,  nondistended, + BS MS: no deformity or atrophy  Skin: warm and dry, no rash Neuro:  Strength and sensation are intact Psych: euthymic mood, full affect   EKG:  EKG is ordered today. The ekg ordered today demonstrates normal sinus rhythm with no significant ST or T wave changes.   Recent Labs: 08/09/2018: ALT 13; BUN 14; Creatinine, Ser 0.64; Hemoglobin 11.9; Platelets 214.0; Potassium 3.7; Sodium 145; TSH 3.87    Lipid Panel    Component Value Date/Time   CHOL 303 (H) 05/08/2018 1147   TRIG 361.0 (H) 05/08/2018 1147   HDL 51.70 05/08/2018 1147   CHOLHDL 6 05/08/2018 1147   VLDL 72.2 (H) 05/08/2018 1147   LDLCALC 136 (H) 09/19/2013 1640   LDLDIRECT 169.0 05/08/2018 1147      Wt Readings from Last 3 Encounters:  01/03/19 158 lb (71.7 kg)  11/28/18 159 lb  8 oz (72.3 kg)  11/26/18 162 lb (73.5 kg)       PAD Screen 11/28/2018  Previous PAD dx? Yes  Previous surgical procedure? No  Pain with walking? Yes  Subsides with rest? No  Feet/toe relief with dangling? Yes  Painful, non-healing ulcers? No  Extremities discolored? No      ASSESSMENT AND PLAN:  1.  Atypical chest pain : The patient had a recent nuclear stress test which showed no evidence of ischemia with normal ejection fraction.  She reports no symptoms since her last visit.  No further work-up is needed at the present time.  2.  Moderate pulmonary hypertension: This could be related to underlying lung disease although other etiologies cannot be completely excluded.  I do not think her diastolic dysfunction on echo was significant enough to be contributing to this.  I explained to her that ideally a right heart catheterization is needed to help narrow down the differential.  However, the patient has been dealing with severe back pain which might limit our ability to do a heart cath.  It is probably best to monitor her clinically for now especially with her multiple comorbidities.  I would consider repeat  echocardiogram later this year to ensure stability.  3.  Tobacco use: I discussed the importance of smoking cessation.    Disposition:   FU with me in 6 months.  Signed,  Lorine Bears, MD  01/03/2019 4:03 PM    [facility]

## 2019-01-07 ENCOUNTER — Ambulatory Visit: Payer: Self-pay | Admitting: Cardiovascular Disease

## 2019-01-07 NOTE — Unmapped (Signed)
LVM will send paper work/tax deduction/disabilty    to address on file.

## 2019-01-15 NOTE — Unmapped (Signed)
Pre-op complete, instructed to take am meds.

## 2019-01-21 NOTE — Unmapped (Signed)
[facility] Refill Coordination Note  Specialty Medication(s): LUCYLE ALUMBAUGH Heckmann, DOB: 29-Dec-1960  Phone: [phone] (home) , Alternate phone contact: N/A  Phone or address changes today?: No  All above HIPAA information was verified with patient.  Shipping Address: [address]  [place] [address]   Insurance changes? No    Completed refill call assessment today to schedule patient's medication shipment from the [facility] [phone]).      Confirmed the medication and dosage are correct and have not changed: Yes, regimen is correct and unchanged.    Confirmed patient started or stopped the following medications in the past month:  No, there are no changes reported at this time.    Are you tolerating your medication?:  Margaretmary reports tolerating the medication.    ADHERENCE    (Below is required for Medicare Part B or Transplant patients only - per drug):   How many tablets were dispensed last month: 30 DAYS  Patient currently has REPORTING 7 DAYS remaining.    Did you miss any doses in the past 4 weeks? No missed doses reported.    FINANCIAL/SHIPPING    Delivery Scheduled: Yes, Expected medication delivery date: 01/27/2019     Medication will be delivered via Same Day Courier to the home address in [facility].    The patient will receive a drug information handout for each medication shipped and additional FDA Medication Guides as required.      Julianne did not have any additional questions at this time.    We will follow up with patient monthly for standard refill processing and delivery.      Thank you,  Westley Gambles   [facility] Shared [facility] Specialty Technician

## 2019-01-27 MED FILL — TECFIDERA 240 MG CAPSULE,DELAYED RELEASE: ORAL | 30 days supply | Qty: 60 | Fill #5

## 2019-01-27 MED FILL — TECFIDERA 240 MG CAPSULE,DELAYED RELEASE: 30 days supply | Qty: 60 | Fill #5 | Status: AC

## 2019-02-24 NOTE — Unmapped (Signed)
Attempted to reach pt to review pre-procedure instructions. Left message requesting call back.

## 2019-02-25 NOTE — Unmapped (Signed)
Left message to CB for pre-procedure instructions.

## 2019-02-26 ENCOUNTER — Encounter: Admit: 2019-02-26 | Discharge: 2019-02-26 | Payer: MEDICARE

## 2019-02-26 DIAGNOSIS — I671 Cerebral aneurysm, nonruptured: Principal | ICD-10-CM

## 2019-02-26 DIAGNOSIS — F329 Major depressive disorder, single episode, unspecified: Principal | ICD-10-CM

## 2019-02-26 DIAGNOSIS — F419 Anxiety disorder, unspecified: Principal | ICD-10-CM

## 2019-02-26 DIAGNOSIS — G44209 Tension-type headache, unspecified, not intractable: Principal | ICD-10-CM

## 2019-02-26 DIAGNOSIS — I729 Aneurysm of unspecified site: Principal | ICD-10-CM

## 2019-02-26 DIAGNOSIS — G2581 Restless legs syndrome: Principal | ICD-10-CM

## 2019-02-26 DIAGNOSIS — M797 Fibromyalgia: Principal | ICD-10-CM

## 2019-02-26 DIAGNOSIS — J449 Chronic obstructive pulmonary disease, unspecified: Principal | ICD-10-CM

## 2019-02-26 DIAGNOSIS — G35 Multiple sclerosis: Principal | ICD-10-CM

## 2019-02-26 DIAGNOSIS — G4733 Obstructive sleep apnea (adult) (pediatric): Principal | ICD-10-CM

## 2019-02-26 DIAGNOSIS — I639 Cerebral infarction, unspecified: Principal | ICD-10-CM

## 2019-02-26 NOTE — Unmapped (Signed)
HPI: Jennifer Lewis is a 58 y.o. female with left MCA aneurysm s.p coiling is here for diagnostic cerebral angiogram under supervision of Dr. Eston Esters.     Allergies:   Allergies   Allergen Reactions   ??? Diazepam Anxiety       Medications:   Medications Prior to Admission   Medication Sig Dispense Refill Last Dose   ??? acyclovir (ZOVIRAX) 400 MG tablet Take 400 mg by mouth nightly.    02/25/2019 at Unknown time   ??? albuterol sulfate (PROAIR RESPICLICK) 90 mcg/actuation AePB Inhale 180 mcg Every four (4) hours.   02/25/2019 at Unknown time   ??? aspirin 81 MG chewable tablet Chew 1 tablet (81 mg total) daily. 30 tablet 0 02/26/2019 at Unknown time   ??? baclofen (LIORESAL) 10 MG tablet Take 1 tablet (10 mg total) by mouth Three (3) times a day as needed for muscle spasms. 30 tablet 5 Past Week at Unknown time   ??? carBAMazepine (TEGRETOL) 200 mg tablet Take 1 tablet (200 mg total) by mouth Two (2) times a day. 60 tablet 1 02/26/2019 at Unknown time   ??? cholecalciferol, vitamin D3, (VITAMIN D3) 1,000 unit capsule Take 1,000 Units by mouth daily.   02/26/2019 at Unknown time   ??? dimethyl fumarate 240 mg CpDR TAKE 1 CAPSULE BY MOUTH TWICE DAILY WITH MEALS 60 capsule 99 02/26/2019 at Unknown time   ??? escitalopram oxalate (LEXAPRO) 10 MG tablet Take 10 mg by mouth daily.    02/25/2019 at Unknown time   ??? fluticasone-salmeterol (ADVAIR) 100-50 mcg/dose diskus Inhale 1 puff Two (2) times a day.   02/25/2019 at Unknown time   ??? lisinopril (PRINIVIL,ZESTRIL) 10 MG tablet Take 1 tablet (10 mg total) by mouth daily. 30 tablet 0 02/25/2019 at Unknown time   ??? multivit,iron,minerals/lutein (CENTRUM SILVER ULTRA WOMEN'S ORAL) Take 1 tablet by mouth daily.   02/26/2019 at Unknown time   ??? ondansetron (ZOFRAN-ODT) 4 MG disintegrating tablet Take 4 mg by mouth every six (6) hours as needed for nausea.   Past Month at Unknown time   ??? rosuvastatin (CRESTOR) 10 MG tablet Take 10 mg by mouth daily.   02/25/2019 at Unknown time       Physical Exam:  Vitals:    02/26/19 1150   BP: 151/73   Pulse: 81   Resp: 17   Temp: 36.7 ??C (98.1 ??F)   SpO2: 92%     ASA Grade: ASA 2 - Patient with mild systemic disease with no functional limitations  Airway assessment: Class 2 - Can visualize soft palate and fauces, tip of uvula is obscured  Lungs: Respirations nonlabored  Pulse: RRR  Abd: protuberant and non tender non distended    Lab Results   Component Value Date    PLT 289 07/18/2018       Assessment/Plan:    Jennifer Lewis is a 58 y.o. female who will undergo diagnostic cerebral angiogram in Neurointerventional Radiology.    -This procedure has been fully reviewed with the patient/patient???s authorized representative. The risks, benefits and alternatives have been explained, and the patient has consented to the procedure.  -The patient accepts blood products in an emergent situation.        Michaelene Song, MD FACP  [facility] Fellow

## 2019-02-26 NOTE — Unmapped (Signed)
Procedure:   Cerebral angiogram    Vascular Access:  Right femoral artery    Vessels injected:  right internal carotid artery    Preliminary findings:  Minimal neck remnant of coiled right pcom aneurysm. Marland Kitchen Please see finalized report in PACS    Attending physician:  Eston Esters    Resident physician:   Dillon Bjork    Complications:   NONE    Blood loss:  10 mL      Sheath:  removed    Method of hemostasis:  MynxGrip external closure device

## 2019-02-26 NOTE — Unmapped (Signed)
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 Please see finalized report in PACS    Attending physician:  Eston Esters    Resident / Fellow physician:   Elyse Jarvis     Complications:   NONE    Blood loss:  10 mL    Sheath:  removed    Method of hemostasis:  MynxGrip vascular closure device      Michaelene Song, MD FACP   [facility] fellow

## 2019-03-05 NOTE — Unmapped (Signed)
Pt requested call back from tobacco treatment counselor through our automated call system. Attempted to contact pt twice and left message with information on how to contact our Tobacco Treatment Program if she is still interested in assistance with becoming tobacco-free.    Sara Chu, LCSW, LCAS, NCTTP  Clinical Social Worker / Tobacco Treatment Specialist  Tobacco Treatment Program  [facility]  phone: [phone]  pager: [phone]

## 2019-03-14 NOTE — Unmapped (Addendum)
[facility] Clinical Assessment & Refill Coordination Note    Jennifer Lewis, DOB: 31-Aug-1961  Phone: [phone] (home)     All above HIPAA information was verified with patient.     Specialty Medication(s):   Neurology: Tecfidera     Current Outpatient Medications   Medication Sig Dispense Refill   ??? acyclovir (ZOVIRAX) 400 MG tablet Take 400 mg by mouth nightly.      ??? albuterol sulfate (PROAIR RESPICLICK) 90 mcg/actuation AePB Inhale 180 mcg Every four (4) hours.     ??? aspirin 81 MG chewable tablet Chew 1 tablet (81 mg total) daily. 30 tablet 0   ??? baclofen (LIORESAL) 10 MG tablet Take 1 tablet (10 mg total) by mouth Three (3) times a day as needed for muscle spasms. 30 tablet 5   ??? carBAMazepine (TEGRETOL) 200 mg tablet Take 1 tablet (200 mg total) by mouth Two (2) times a day. 60 tablet 1   ??? cholecalciferol, vitamin D3, (VITAMIN D3) 1,000 unit capsule Take 1,000 Units by mouth daily.     ??? dimethyl fumarate 240 mg CpDR TAKE 1 CAPSULE BY MOUTH TWICE DAILY WITH MEALS 60 capsule 99   ??? escitalopram oxalate (LEXAPRO) 10 MG tablet Take 10 mg by mouth daily.      ??? fluticasone-salmeterol (ADVAIR) 100-50 mcg/dose diskus Inhale 1 puff Two (2) times a day.     ??? lisinopril (PRINIVIL,ZESTRIL) 10 MG tablet Take 1 tablet (10 mg total) by mouth daily. 30 tablet 0   ??? multivit,iron,minerals/lutein (CENTRUM SILVER ULTRA WOMEN'S ORAL) Take 1 tablet by mouth daily.     ??? ondansetron (ZOFRAN-ODT) 4 MG disintegrating tablet Take 4 mg by mouth every six (6) hours as needed for nausea.     ??? rosuvastatin (CRESTOR) 10 MG tablet Take 10 mg by mouth daily.       No current facility-administered medications for this visit.         Changes to medications: [name] reports no changes reported at this time.    No Known Allergies    Changes to allergies: Yes: Patient stated that diazepam caused severe anxiety 30 years ago.  She requested that it be taken off as an allergic reaction.  I updated Epic. SPECIALTY MEDICATION ADHERENCE     Tecfidera 240 mg: 7 days of medicine on hand     Medication Adherence    Patient reported X missed doses in the last month:  0  Specialty Medication:  Tecfidera 240mg   Patient is on additional specialty medications:  No  Informant:  patient  Adherence tools used:  patient uses a pill box to manage medications   Other adherence tool:  leaves medicine on dining table to take with meals           Specialty medication(s) dose(s) confirmed: Regimen is correct and unchanged.     Are there any concerns with adherence? Yes: Eventhough she stated that she did not miss any doses in the past 4 weeks, her amount on hand is higher than it should be based on the last time we filled the prescription.    Adherence counseling provided? She stated that she missed no doses but I am concerned based on the timing of her last fill and the amount on hand. I emphasized the importance of taking every day. She reported using a pillbox and leaving med on the dining tabler.  I  will report to the clinic.    CLINICAL MANAGEMENT AND INTERVENTION  Clinical Benefit Assessment:    Do you feel the medicine is effective or helping your condition? Yes    Clinical Benefit counseling provided? Not needed    Adverse Effects Assessment:    Are you experiencing any side effects? No    Are you experiencing difficulty administering your medicine? No    Quality of Life Assessment:    How many days over the past month did your MS  keep you from your normal activities? For example, brushing your teeth or getting up in the morning. every day of the month she can't do her usual chores.  She says her husband has to do them for her.  She stated that her fibromyalgia is also contributing to the problem.    Have you discussed this with your provider? Yes    Therapy Appropriateness:    Is therapy appropriate? Yes, therapy is appropriate and should be continued    DISEASE/MEDICATION-SPECIFIC INFORMATION      N/A    PATIENT SPECIFIC NEEDS     ? Does the patient have any physical, cognitive, or cultural barriers? No    ? Is the patient high risk? No     ? Does the patient require a Care Management Plan? No     ? Does the patient require physician intervention or other additional services (i.e. nutrition, smoking cessation, social work)? Yes - She stated her doctor wants her to stop smoking and will be giving her a prescription for a medicine to help. She did not know what it was when I asked.      SHIPPING     Specialty Medication(s) to be Shipped:   Neurology: Tecfidera    Other medication(s) to be shipped: n/a     Changes to insurance: No    Delivery Scheduled: Yes, Expected medication delivery date: 03/18/2019.     Medication will be delivered via Next Day Courier to the confirmed home address in [facility].    The patient will receive a drug information handout for each medication shipped and additional FDA Medication Guides as required.  Verified that patient has previously received a Conservation officer, historic buildings.    Roderic Palau   [facility] Shared [facility] Specialty Pharmacist

## 2019-03-17 MED FILL — TECFIDERA 240 MG CAPSULE,DELAYED RELEASE: 30 days supply | Qty: 60 | Fill #6 | Status: AC

## 2019-03-17 MED FILL — TECFIDERA 240 MG CAPSULE,DELAYED RELEASE: ORAL | 30 days supply | Qty: 60 | Fill #6

## 2019-03-18 NOTE — Unmapped (Signed)
Spoke with Jennifer Lewis, 57 y.o., for treatment of tobacco use/dependence.    SUMMARY: Pt contacted SW after receiving call from our automated call system. Pt expresses interest in becoming tobacco-free, citing her health as motivation. She and her husband are both wishing to quit. Pt has had success with Chantix in the past and would like to try it again. She plans to ask her PCP for Rx. SW provided information regarding [place] [facility] as an additional resource for pt and her husband.       Outpatient/Discharge Medications Recommended: Varenicline    Tobacco Use Treatment  Program: Hospital Inpatient  Type of Visit: Initial  Tobacco Use Treatment Visit: Talked with patient  Goals Of Session: Insight, increase, Assessment    Tobacco Use During Past 30 Days  Time Since Last Tobacco Use: smoked a cigarette today (at least one puff)  Type of Tobacco Products Used: Cigarettes  Quantity Used: 20(20-30)  Quantity Per: day  Other Household Members Use Tobacco: Yes     Tobacco Use History  Longest Time Without Tobacco: Years  # of Hours, Days, Weeks, Months or Years: 5  Medications Used in Past Attempts: Varenicline    Behavioral Assessment  Why Uses: 1. Habit 2. Stress/anxiety relief  Reasons to Become Tobacco Free: Health (has emphysema)     Treatment Plan  Cessation Meds Currently Using: None  Outpatient/Discharge Medications Recommended: Varenicline  Plan to Obtain Outpatient Meds: Other(Pt will ask PCP for Rx)  Patient's Plan Post Discharge/Visit: Plan to quit as soon as possible       TTS Information  Diagnosis: Tobacco use disorder, unspecified, uncomplicated (F17.200)  Interventions: Assessed, Discussed, Informed, Motivational interviewing, Suggested, Taught, Tx plan development, Psycho-education, Encouraged  TTS Visit Length: >10 minutes       Sara Chu, LCSW, LCAS, NCTTP  Clinical Social Worker / Tobacco Treatment Specialist  Tobacco Treatment Program  [facility]  phone: [phone]  pager: [phone]

## 2019-03-26 ENCOUNTER — Ambulatory Visit: Payer: Medicare Other

## 2019-04-01 ENCOUNTER — Ambulatory Visit: Payer: Medicare Other

## 2019-04-01 NOTE — Unmapped (Signed)
Spoke with Jennifer Lewis, 57 y.o., for treatment of tobacco use/dependence.    SUMMARY: Pt requested a second call back from tobacco treatment counselor through our automated call system. Pt remains interested in becoming tobacco-free and states she and her husband smoked their last cigarette yesterday and do not plan to purchase more. Pt endorses cravings and states that she is having difficulty not smoking. Pt was planning to ask PCP for Chantix Rx but states her appt has been moved due to COVID. Pt receptive to trying patches + lozenges to assist with cravings in the meantime. She plans to contact [place] [facility] for medication assistance. Pt identifies her chronic pain as a barrier to staying quit and we discussed enjoyable activities pt can engage in to help distract from pain and cravings.       Outpatient/Discharge Medications Recommended: Patch 42mg , Lozenge 4mg     Tobacco Use Treatment  Program: Hospital Inpatient  Type of Visit: Follow-up  Tobacco Use Treatment Visit: Talked with patient  Goals Of Session: Insight, increase, Assessment, Relapse prevention skills training     Tobacco Use During Past 30 Days  Time Since Last Tobacco Use: 1 to 7 days ago  Tobacco Withdrawal (Past 24 Hours): Desire or craving to smoke  Type of Tobacco Products Used: Cigarettes  Quantity Used: 30  Quantity Per: day     Behavioral Assessment  Barriers/Challenges: 1. chronic pain 2. stress 3. boredom  Strategies: We discusssed pt staying occupied by engaging in crafts and other activiites she enjoys     Treatment Plan  Cessation Meds Currently Using: None  Outpatient/Discharge Medications Recommended: Patch 42mg , Lozenge 4mg   Plan to Obtain Outpatient Meds: [place] [facility]  Referrals: Pt plans to call [facility]uit[facility]  Patient's Plan Post Discharge/Visit: Stay quit       TTS Information  Diagnosis: Tobacco use disorder, unspecified, uncomplicated (F17.200)  Interventions: Assessed, Behavioral techniques, Informed, Discussed, Motivational interviewing, Suggested, Taught, Tx plan development, Psycho-education, Encouraged  TTS Visit Length: Psychotherapy >16 min       Sara Chu, LCSW, LCAS, NCTTP  Clinical Social Worker / Tobacco Treatment Specialist  Tobacco Treatment Program  [facility]  phone: [phone]  pager: [phone]

## 2019-04-14 NOTE — Unmapped (Signed)
[facility] Shared [facility] Special[facility] Pharmacy Clinical Assessment & Refill Coordination Note    Jennifer Lewis, DOB: 12-Jul-1961  Phone: [phone] (home)     All above HIPAA information was verified with patient.     Specialty Medication(s):   Neurology: Tecfidera     Current Outpatient Medications   Medication Sig Dispense Refill   ??? acyclovir (ZOVIRAX) 400 MG tablet Take 400 mg by mouth nightly.      ??? albuterol sulfate (PROAIR RESPICLICK) 90 mcg/actuation AePB Inhale 180 mcg Every four (4) hours.     ??? aspirin 81 MG chewable tablet Chew 1 tablet (81 mg total) daily. 30 tablet 0   ??? baclofen (LIORESAL) 10 MG tablet Take 1 tablet (10 mg total) by mouth Three (3) times a day as needed for muscle spasms. 30 tablet 5   ??? carBAMazepine (TEGRETOL) 200 mg tablet Take 1 tablet (200 mg total) by mouth Two (2) times a day. 60 tablet 1   ??? cholecalciferol, vitamin D3, (VITAMIN D3) 1,000 unit capsule Take 1,000 Units by mouth daily.     ??? dimethyl fumarate 240 mg CpDR TAKE 1 CAPSULE BY MOUTH TWICE DAILY WITH MEALS 60 capsule 99   ??? escitalopram oxalate (LEXAPRO) 10 MG tablet Take 10 mg by mouth daily.      ??? fluticasone-salmeterol (ADVAIR) 100-50 mcg/dose diskus Inhale 1 puff Two (2) times a day.     ??? lisinopril (PRINIVIL,ZESTRIL) 10 MG tablet Take 1 tablet (10 mg total) by mouth daily. 30 tablet 0   ??? multivit,iron,minerals/lutein (CENTRUM SILVER ULTRA WOMEN'S ORAL) Take 1 tablet by mouth daily.     ??? ondansetron (ZOFRAN-ODT) 4 MG disintegrating tablet Take 4 mg by mouth every six (6) hours as needed for nausea.     ??? rosuvastatin (CRESTOR) 10 MG tablet Take 10 mg by mouth daily.       No current facility-administered medications for this visit.         Changes to medications: [name] reports no changes at this time.    No Known Allergies    Changes to allergies: No    SPECIALTY MEDICATION ADHERENCE     Tecfidera 240 mg: 5 days of medicine on hand     Medication Adherence    Patient reported X missed doses in the last month: 1  Specialty Medication:  Tecfidera  Patient is on additional specialty medications:  No  Adherence tools used:  patient uses a pill box to manage medications   Other adherence tool:  leaves medicine on dining table to take with meals           Specialty medication(s) dose(s) confirmed: Regimen is correct and unchanged.     Are there any concerns with adherence? No    Adherence counseling provided? Not needed    CLINICAL MANAGEMENT AND INTERVENTION      Clinical Benefit Assessment:    Do you feel the medicine is effective or helping your condition? Yes    Clinical Benefit counseling provided? Not needed - Pt did have questions about the ability of Tecfidera to suppress the immune system. I did confirm that it is a possibility but the risk of that does not outweigh the benefit of the medication. She said she would talk to the doctor about potentially switching medications.    Adverse Effects Assessment:    Are you experiencing any side effects? No    Are you experiencing difficulty administering your medicine? No    Quality of Life Assessment:  How many days over the past month did your MS  keep you from your normal activities? For example, brushing your teeth or getting up in the morning. 3 days per week    Have you discussed this with your provider? Yes    Therapy Appropriateness:    Is therapy appropriate? Yes, therapy is appropriate and should be continued    DISEASE/MEDICATION-SPECIFIC INFORMATION      N/A    PATIENT SPECIFIC NEEDS     ? Does the patient have any physical, cognitive, or cultural barriers? No    ? Is the patient high risk? No     ? Does the patient require a Care Management Plan? No     ? Does the patient require physician intervention or other additional services (i.e. nutrition, smoking cessation, social work)? No      SHIPPING     Specialty Medication(s) to be Shipped:   Neurology: Tecfidera    Other medication(s) to be shipped: None     Changes to insurance: No    Delivery Scheduled: Yes, Expected medication delivery date: 04/16/19.     Medication will be delivered via Next Day Courier to the confirmed home address in [facility].    The patient will receive a drug information handout for each medication shipped and additional FDA Medication Guides as required.  Verified that patient has previously received a Conservation officer, historic buildings.    Arnold Long   [facility] Specialty Pharmacist

## 2019-04-15 MED FILL — TECFIDERA 240 MG CAPSULE,DELAYED RELEASE: 30 days supply | Qty: 60 | Fill #7 | Status: AC

## 2019-04-15 MED FILL — TECFIDERA 240 MG CAPSULE,DELAYED RELEASE: ORAL | 30 days supply | Qty: 60 | Fill #7

## 2019-04-17 ENCOUNTER — Encounter: Payer: Self-pay | Admitting: *Deleted

## 2019-04-22 MED ORDER — CARBAMAZEPINE 200 MG TABLET
ORAL_TABLET | Freq: Two times a day (BID) | ORAL | 5 refills | 0.00000 days | Status: CP
Start: 2019-04-22 — End: 2019-06-22

## 2019-04-22 NOTE — Unmapped (Signed)
Last visit 01/02/2018  Next visit 06/17/2019

## 2019-04-27 ENCOUNTER — Encounter: Payer: Self-pay | Admitting: Family

## 2019-04-28 ENCOUNTER — Other Ambulatory Visit: Payer: Self-pay

## 2019-04-28 DIAGNOSIS — F411 Generalized anxiety disorder: Secondary | ICD-10-CM

## 2019-04-28 MED ORDER — ESCITALOPRAM OXALATE 20 MG PO TABS: 20.0000 mg | ORAL_TABLET | Freq: Every day | ORAL | 1 refills | Status: DC

## 2019-05-07 NOTE — Unmapped (Signed)
[facility] Specialty Pharmacy Refill Coordination Note    Specialty Medication(s) to be Shipped:   Neurology: Tecfidera    Other medication(s) to be shipped: n/a     Jennifer Lewis, DOB: 1961-01-12  Phone: [phone] (home)       All above HIPAA information was verified with patient.     Completed refill call assessment today to schedule patient's medication shipment from the [facility] [phone]).       Specialty medication(s) and dose(s) confirmed: Regimen is correct and unchanged.   Changes to medications: Jennifer Lewis reports no changes at this time.  Changes to insurance: No  Questions for the pharmacist: No    Confirmed patient received Welcome Packet with first shipment. The patient will receive a drug information handout for each medication shipped and additional FDA Medication Guides as required.       DISEASE/MEDICATION-SPECIFIC INFORMATION        N/A    SPECIALTY MEDICATION ADHERENCE     Medication Adherence    Patient reported X missed doses in the last month:  0  Specialty Medication:  Tecfidera  Patient is on additional specialty medications:  No  Patient is on more than two specialty medications:  No  Any gaps in refill history greater than 2 weeks in the last 3 months:  no  Demonstrates understanding of importance of adherence:  yes  Informant:  patient  Adherence tools used:  patient uses a pill box to manage medications   Other adherence tool:  leaves medicine on dining table to take with meals                 Tecfidera 240mg : Patient has 9 days of medication on hand      SHIPPING     Shipping address confirmed in Epic.     Delivery Scheduled: Yes, Expected medication delivery date: 05/14/19.     Medication will be delivered via Next Day Courier to the home address in Epic [facility].    [name]   [facility] Specialty Technician

## 2019-05-13 MED FILL — TECFIDERA 240 MG CAPSULE,DELAYED RELEASE: ORAL | 30 days supply | Qty: 60 | Fill #8

## 2019-05-13 MED FILL — TECFIDERA 240 MG CAPSULE,DELAYED RELEASE: 30 days supply | Qty: 60 | Fill #8 | Status: AC

## 2019-05-21 ENCOUNTER — Ambulatory Visit (INDEPENDENT_AMBULATORY_CARE_PROVIDER_SITE_OTHER): Payer: Medicare Other | Admitting: Family

## 2019-05-21 ENCOUNTER — Ambulatory Visit (INDEPENDENT_AMBULATORY_CARE_PROVIDER_SITE_OTHER): Payer: Medicare Other

## 2019-05-21 ENCOUNTER — Telehealth: Payer: Self-pay | Admitting: Cardiovascular Disease

## 2019-05-21 ENCOUNTER — Encounter: Payer: Self-pay | Admitting: Family

## 2019-05-21 ENCOUNTER — Other Ambulatory Visit: Payer: Self-pay

## 2019-05-21 DIAGNOSIS — I700 Atherosclerosis of aorta: Secondary | ICD-10-CM | POA: Diagnosis not present

## 2019-05-21 DIAGNOSIS — Z0000 Encounter for general adult medical examination without abnormal findings: Secondary | ICD-10-CM | POA: Diagnosis not present

## 2019-05-21 DIAGNOSIS — M797 Fibromyalgia: Secondary | ICD-10-CM | POA: Diagnosis not present

## 2019-05-21 DIAGNOSIS — R079 Chest pain, unspecified: Secondary | ICD-10-CM

## 2019-05-21 DIAGNOSIS — A6004 Herpesviral vulvovaginitis: Secondary | ICD-10-CM | POA: Diagnosis not present

## 2019-05-21 DIAGNOSIS — J449 Chronic obstructive pulmonary disease, unspecified: Secondary | ICD-10-CM

## 2019-05-21 DIAGNOSIS — F411 Generalized anxiety disorder: Secondary | ICD-10-CM

## 2019-05-21 DIAGNOSIS — M62838 Other muscle spasm: Secondary | ICD-10-CM

## 2019-05-21 MED ORDER — BACLOFEN 10 MG PO TABS: 10.0000 mg | ORAL_TABLET | Freq: Three times a day (TID) | ORAL | 4 refills | Status: DC | PRN

## 2019-05-21 MED ORDER — ACYCLOVIR 400 MG PO TABS: 400.0000 mg | ORAL_TABLET | Freq: Every day | ORAL | 3 refills | Status: DC

## 2019-05-21 MED ORDER — ROSUVASTATIN CALCIUM 10 MG PO TABS: 10.0000 mg | ORAL_TABLET | Freq: Every day | ORAL | 3 refills | Status: DC

## 2019-05-21 MED ORDER — FLUTICASONE-SALMETEROL 250-50 MCG/DOSE IN AEPB: 1.0000 | INHALATION_SPRAY | Freq: Two times a day (BID) | RESPIRATORY_TRACT | 3 refills | Status: DC

## 2019-05-21 MED ORDER — ESCITALOPRAM OXALATE 20 MG PO TABS: 20.0000 mg | ORAL_TABLET | Freq: Every day | ORAL | 1 refills | Status: DC

## 2019-05-21 NOTE — Assessment & Plan Note (Signed)
Unsure if muscle spasm related to MS or anxiety. Will start with baclofen as needed. Patient will let me know if resolves, and if not, we will discuss adjunctive therapy for anxiety. Follow up in 2 months

## 2019-05-21 NOTE — Patient Instructions (Addendum)
Please go to ED for any new or worsening symptoms in regards to chest pain.  Please read the below regards to heart attack .  we will call Dr Jari Sportsman office to ensure you have follow up.   We will do a trial increase of the Advair to see if your shortness of breath, cough which improved.  As discussed, smoking cessation would be most important in regards to progression of your COPD.  In regards to what appears to be muscle spasms, Kincaid use baclofen as needed.  Certainly if this does not resolve symptoms or new symptoms or concerns develop, please let me know.  Please call the office schedule follow-up in the next 2 months   Heart Attack A heart attack (myocardial infarction, MI) is when blood and oxygen supply to the heart is cut off. A heart attack causes damage to the heart that cannot be fixed. If you think you are having a heart attack, do not wait to see if the symptoms will go away. Get medical help right away. What are the signs or symptoms? Symptoms of this condition include:  Chest pain. It Spratley feel like: ? Crushing or squeezing. ? Tightness, pressure, fullness, or heaviness.  Pain in the arm, neck, jaw, back, or upper body.  Shortness of breath.  Heartburn.  Indigestion.  Nausea.  Cold sweats.  Feeling tired.  Sudden lightheadedness. Follow these instructions at home: Medicines  Take over-the-counter and prescription medicines only as told by your doctor. You Kerkman need to take medicine: ? To keep your blood from clotting too easily. ? To control blood pressure. ? To lower cholesterol. ? To control heart rhythms.  Do not take these medicines unless your doctor says it is okay: ? NSAIDs. ? Supplements that have vitamin A, vitamin E, or both. ? Hormone replacement therapy that has estrogen with or without progestin. Lifestyle   Do not use any products that have nicotine or tobacco. This includes cigarettes and e-cigarettes. If you need help quitting, ask your  doctor.  Avoid secondhand smoke.  Exercise regularly. Ask your doctor about a cardiac rehab program.  Eat heart-healthy foods. A diet and nutrition specialist (registered dietitian) can help you form healthy eating habits.  Stay at a healthy weight.  Lower your stress level.  Do not use illegal drugs. Alcohol use  Do not drink alcohol if: ? Your doctor tells you not to drink. ? You are pregnant, Mone be pregnant, or are planning to become pregnant.  If you drink alcohol, limit how much you have: ? 0-1 drink a day for women. ? 0-2 drinks a day for men.  Know how much alcohol is in your drink. In the [place] one drink equals one typical bottle of beer (12 oz), one-half glass of wine (5 oz), or one shot of hard liquor (1 oz). General instructions  Work with your doctor to treat other problems you have, like diabetes.  Get screened for depression, and get treatment if needed.  Keep your vaccinations up to date. Get the flu shot (influenza vaccination) every year.  Keep all follow-up visits as told by your doctor. This is important. Contact a doctor if:  You feel sad.  You have trouble doing your daily activities. Get help right away if you:  Have sudden, unexplained discomfort in your: ? Chest. ? Arms. ? Back. ? Neck. ? Jaw. ? Upper body.  Have shortness of breath.  Have sudden sweating or clammy skin.  Feel sick to your stomach (nauseous).  Throw up (vomit).  Suddenly get lightheaded or dizzy.  Feel your heart beating fast.  Feel your heart skipping beats.  Have blood pressure that is higher than 180/120. These symptoms Davanzo be an emergency. Do not wait to see if the symptoms will go away. Get medical help right away. Call your local emergency services (911 in the [place] Do not drive yourself to the hospital. Summary  A heart attack is when blood and oxygen supply to the heart is cut off.  You should not take NSAIDs unless your doctor says it is  okay.  Do not smoke. Avoid secondhand smoke.  Exercise regularly. Ask your doctor about a cardiac rehab program. This information is not intended to replace advice given to you by your health care provider. Make sure you discuss any questions you have with your health care provider. Document Released: 06/11/2012 Document Revised: 09/04/2018 Document Reviewed: 01/25/2018 Elsevier Interactive Patient Education  2019 [employer].

## 2019-05-21 NOTE — Progress Notes (Addendum)
Subjective:   Debra Hogan is a 58 y.o. female who presents for Medicare Annual (Subsequent) preventive examination.  Review of Systems:  No ROS.  Medicare Wellness Virtual Visit.  Visual/audio telehealth visit, UTA vital signs.   See social history for additional risk factors.  Cardiac Risk Factors include: hypertension     Objective:     Vitals: There were no vitals taken for this visit.  There is no height or weight on file to calculate BMI.  Advanced Directives 05/21/2019 03/25/2018 01/04/2016 12/31/2015 12/31/2015  Does Patient Have a Medical Advance Directive? No No No - No  Does patient want to make changes to medical advance directive? - Yes (MAU/Ambulatory/Procedural Areas - Information given) - - -  Would patient like information on creating a medical advance directive? Yes (MAU/Ambulatory/Procedural Areas - Information given) - No - patient declined information Yes - Educational materials given Yes - Transport planner given    Tobacco Social History   Tobacco Use  Smoking Status Current Every Day Smoker   Packs/day: 1.00   Years: 43.00   Pack years: 43.00  Smokeless Tobacco Never Used     Ready to quit: Not Answered Counseling given: Not Answered   Clinical Intake:  Pre-visit preparation completed: Yes        Diabetes: No  How often do you need to have someone help you when you read instructions, pamphlets, or other written materials from your doctor or pharmacy?: 1 - Never  Interpreter Needed?: No     Past Medical History:  Diagnosis Date   Arthritis    COPD (chronic obstructive pulmonary disease) (HCC)    Depression    Fibromyalgia    Irritable bowel syndrome    MS (multiple sclerosis) (HCC)    Followed by Dr. Sherryll Burger   Past Surgical History:  Procedure Laterality Date   ABDOMINAL HYSTERECTOMY     CERVIX intact   CHOLECYSTECTOMY N/A 01/03/2016   Procedure: LAPAROSCOPIC CHOLECYSTECTOMY WITH INTRAOPERATIVE CHOLANGIOGRAM;  Surgeon:  Lattie Haw, MD;  Location: [facility] ORS;  Service: General;  Laterality: N/A;   ENDOSCOPIC RETROGRADE CHOLANGIOPANCREATOGRAPHY (ERCP) WITH PROPOFOL N/A 01/04/2016   Procedure: ENDOSCOPIC RETROGRADE CHOLANGIOPANCREATOGRAPHY (ERCP) WITH PROPOFOL;  Surgeon: Midge Minium, MD;  Location: [facility] ENDOSCOPY;  Service: Endoscopy;  Laterality: N/A;   FOOT SURGERY  2001   LAPAROSCOPIC ENDOMETRIOSIS FULGURATION     Family History  Problem Relation Age of Onset   Hypertension Mother    Hyperlipidemia Mother    Arthritis Mother    Cancer Father        throat   Heart disease Brother    Bipolar disorder Son    Schizophrenia Son    Cancer Maternal Grandfather        liver   Cancer Paternal Grandmother        liver   Social History   Socioeconomic History   Marital status: Married    Spouse name: Not on file   Number of children: Not on file   Years of education: Not on file   Highest education level: Not on file  Occupational History   Not on file  Social Needs   Financial resource strain: Not hard at all   Food insecurity:    Worry: Never true    Inability: Never true   Transportation needs:    Medical: No    Non-medical: No  Tobacco Use   Smoking status: Current Every Day Smoker    Packs/day: 1.00    Years: 43.00  Pack years: 43.00   Smokeless tobacco: Never Used  Substance and Sexual Activity   Alcohol use: No    Alcohol/week: 0.0 standard drinks   Drug use: No   Sexual activity: Not Currently  Lifestyle   Physical activity:    Days per week: 0 days    Minutes per session: Not on file   Stress: Not at all  Relationships   Social connections:    Talks on phone: Not on file    Gets together: Not on file    Attends religious service: Not on file    Active member of club or organization: Not on file    Attends meetings of clubs or organizations: Not on file    Relationship status: Not on file  Other Topics Concern   Not on file  Social  History Narrative   [place] in [place] with [place]. Widowed x 2.      Married for 10 years.       On disability       Outpatient Encounter Medications as of 05/21/2019  Medication Sig   acyclovir (ZOVIRAX) 400 MG tablet Take 1 tablet (400 mg total) by mouth daily.   Albuterol Sulfate (PROAIR RESPICLICK) 108 (90 Base) MCG/ACT AEPB Inhale 90 mcg into the lungs every 6 (six) hours as needed (as needed for cough, wheezing). (Patient taking differently: Inhale 180 mcg into the lungs every 6 (six) hours as needed (as needed for cough, wheezing). )   aspirin 81 MG tablet Take 81 mg by mouth daily.   baclofen (LIORESAL) 10 MG tablet Take 1 tablet (10 mg total) by mouth 3 (three) times daily as needed for muscle spasms.   carbamazepine (TEGRETOL) 200 MG tablet Take 200 mg by mouth 2 (two) times daily.   cholecalciferol (VITAMIN D) 1000 units tablet Take 1,000 Units by mouth daily.   diclofenac sodium (VOLTAREN) 1 % GEL Apply 4 g topically 4 (four) times daily.   Dimethyl Fumarate 240 MG CPDR Take 240 mg by mouth 2 (two) times daily.   escitalopram (LEXAPRO) 20 MG tablet Take 1 tablet (20 mg total) by mouth daily.   Fluticasone-Salmeterol (ADVAIR DISKUS) 100-50 MCG/DOSE AEPB Inhale 1 puff into the lungs 2 (two) times daily as needed (for shortness of breath).   Fluticasone-Salmeterol (ADVAIR DISKUS) 250-50 MCG/DOSE AEPB Inhale 1 puff into the lungs 2 (two) times daily.   lisinopril (PRINIVIL,ZESTRIL) 10 MG tablet Take 1 tablet (10 mg total) by mouth daily.   Multiple Vitamins-Minerals (CENTRUM SILVER ULTRA WOMENS) TABS Take 1 tablet by mouth daily.   ondansetron (ZOFRAN-ODT) 4 MG disintegrating tablet Take 1 tablet (4 mg total) by mouth every 6 (six) hours as needed for nausea or vomiting.   rosuvastatin (CRESTOR) 10 MG tablet Take 1 tablet (10 mg total) by mouth daily.   No facility-administered encounter medications on file as of 05/21/2019.     Activities of Daily Living In your  present state of health, do you have any difficulty performing the following activities: 05/21/2019  Hearing? N  Vision? N  Difficulty concentrating or making decisions? Y  Comment Difficulty concentrating at times  Walking or climbing stairs? Y  Comment Cane or walker as needed  Dressing or bathing? N  Doing errands, shopping? Y  Comment She does not Engineer, manufacturingdrive  Preparing Food and eating ? N  Using the Toilet? N  In the past six months, have you accidently leaked urine? Y  Comment Managed with daily pad.  Do you have problems with  loss of bowel control? N  Managing your Medications? N  Managing your Finances? Y  Comment Shared with husband  Housekeeping or managing your Housekeeping? Y  Comment Husband assists  Some recent data might be hidden    Patient Care Team: Allegra Grana, FNP as PCP - General (Family Medicine)    Assessment:   This is a routine wellness examination for Debra Hogan.  I connected with patient 05/21/19 at  3:30 PM EDT by a video/audio enabled telemedicine application and verified that I am speaking with the correct person using two identifiers. Patient stated full name and DOB. Patient gave permission to continue with virtual visit. Patient's location was at home and Nurse's location was at [place] office.   Health Screenings  Mammogram - 04/2013; discontinued.  Colonoscopy - 09/2011 Glaucoma -none Hearing -demonstrates normal hearing during visit. Hemoglobin A1C - 04/2018 Cholesterol - 04/2018 TSH- 07/2018  Social  Alcohol intake - no       Smoking history- current Smokers in home? none Illicit drug use? none Exercise - none Diet - regular Sexually Active -not currently BMI- discussed the importance of a healthy diet, water intake and the benefits of aerobic exercise.  Educational material provided.   Safety  Patient feels safe at home- yes Patient does have smoke detectors at home- yes Patient does wear sunscreen or protective clothing when in  direct sunlight -yes Patient does wear seat belt when in a moving vehicle -yes  Covid-19 precautions and sickness symptoms discussed.   Activities of Daily Living Patient denies needing assistance with feeding themselves, getting from bed to chair, getting to the toilet, bathing/showering, dressing, managing money, or preparing meals.   Depression Screen Patient denies losing interest in daily life, feeling hopeless, or crying easily over simple problems.   Medication-taking as directed and without issues.   Fall Screen Patient has fallen twice in the last 12 months, without injury. Followed by pcp.   Memory Screen Patient is alert.  Patient denies difficulty focusing, concentrating or misplacing items. Correctly identified the president of the [place], season and recall. Patient likes to read or work puzzles for brain stimulation.  Immunizations The following Immunizations were discussed: Influenza, shingles, pneumonia, and tetanus.   Other Providers Patient Care Team: Allegra Grana, FNP as PCP - General (Family Medicine)  Exercise Activities and Dietary recommendations Current Exercise Habits: The patient does not participate in regular exercise at present  Goals      Patient Stated    Spot check blood pressure as defined by provider (pt-stated)     Less than 140/90       Fall Risk Fall Risk  03/25/2018 07/16/2017 02/21/2017  Falls in the past year? No No Yes  Number falls in past yr: - - 2 or more  Risk Factor Category  - - High Fall Risk  Risk for fall due to : - - History of fall(s)   Depression Screen PHQ 2/9 Scores 05/21/2019 03/25/2018 07/16/2017 02/21/2017  PHQ - 2 Score 1 0 0 0  PHQ- 9 Score 10 - - -     Cognitive Function MMSE - Mini Mental State Exam 03/25/2018  Orientation to time 5  Orientation to Place 5  Registration 3  Attention/ Calculation 5  Recall 3  Language- name 2 objects 2  Language- repeat 1  Language- follow 3 step command 3  Language-  read & follow direction 1  Write a sentence 1  Copy design 1  Total score 30  6CIT Screen 05/21/2019  What Year? 0 points  What month? 0 points  What time? 0 points  Count back from 20 0 points  Months in reverse 0 points  Repeat phrase 0 points  Total Score 0    Immunization History  Administered Date(s) Administered   Influenza Split 10/07/2012, 10/25/2014   Influenza Whole 10/09/2013   Influenza,inj,Quad PF,6+ Mos 10/21/2015, 01/02/2018, 10/21/2018   Pneumococcal Polysaccharide-23 01/04/2016, 05/13/2018   Tdap 10/21/2004, 05/13/2018   Screening Tests Health Maintenance  Topic Date Due   INFLUENZA VACCINE  07/26/2019   PAP SMEAR-Modifier  04/19/2021   COLONOSCOPY  09/24/2021   TETANUS/TDAP  05/13/2028   Hepatitis C Screening  Completed   HIV Screening  Completed   MAMMOGRAM  Discontinued      Plan:    End of life planning; Advance aging; Advanced directives discussed.  Copy of current HCPOA/Living Will requested upon completion.    I have personally reviewed and noted the following in the patients chart:    Medical and social history  Use of alcohol, tobacco or illicit drugs   Current medications and supplements  Functional ability and status  Nutritional status  Physical activity  Advanced directives  List of other physicians  Hospitalizations, surgeries, and ER visits in previous 12 months  Vitals  Screenings to include cognitive, depression, and falls  Referrals and appointments  In addition, I have reviewed and discussed with patient certain preventive protocols, quality metrics, and best practice recommendations. A written personalized care plan for preventive services as well as general preventive health recommendations were provided to patient.     Ashok Pall, LPN  [id number]   Agree with plan. Rennie Plowman, NP

## 2019-05-21 NOTE — Telephone Encounter (Signed)
M. Arnett's office calling States they just completed a virtual visit with patient and she was complaining of chest pain States the chest pain has been consistent and has never really gone away M. Arnett would like to know if we could call and triage patient to see if a visit will be needed Please call to discuss

## 2019-05-21 NOTE — Assessment & Plan Note (Signed)
Symptom most consistent with muscle spasm. Trial of baclofen. Patient will let me know how she is doing

## 2019-05-21 NOTE — Progress Notes (Signed)
This visit type was conducted due to national recommendations for restrictions regarding the COVID-19 pandemic (e.g. social distancing).  This format is felt to be most appropriate for this patient at this time.  All issues noted in this document were discussed and addressed.  No physical exam was performed (except for noted visual exam findings with Video Visits). Virtual Visit via Video Note  I connected with@  on 05/21/19 at  2:00 PM EDT by a video enabled telemedicine application and verified that I am speaking with the correct person using two identifiers.  Location patient: home Location provider:work  Persons participating in the virtual visit: patient, provider  I discussed the limitations of evaluation and management by telemedicine and the availability of in person appointments. The patient expressed understanding and agreed to proceed.   HPI:  Overall feels well. No new complaints.   Cp- continues. Middle left side of chest. 'Not happening more often'. Notes episode one week ago when laying in bed had a 'boom boom in chest' . Prior episode had been a month prior and had a similar episode when watching tv.  Describes as intermittent sharp pain which resolved on its own for 30 minutes. It is not crushing pain. Thought maybe fibromyalgia.  No epigastic burning, abdominal pain, fever, vomiting  No associated nauseated, diaphoresis,  numbness or tingling radiating to left arm or jaw, palpitations, dizziness, frequent headaches, changes in vision with chest pain episode.    normal stress test per Dr Kirke CorinArida 12/2018; repeat echo Moderate pulmonary HTN  Depression- doing well on lexapro.   fibromyalgia- having pain 'all over'. Worse with bad weather, with a lot of movement, increased with anxiety.  Occasionally feels tightening like 'spasm' over entire body at night.  Baclofen would help. Would like refill   [facility]  Continues to smokes-no longer on chantix. Hard  to quit as husband still smokes.   Emphysema/COPD- SOB is more often. For example if walks to mailbox will get SOB. Cough has worse, intermittent sputum to dry cough.  dr Darlin Priestlyramachadran 11/2018; continue advair, albuterol which is helpful.  Wears 2L 02 at night.   CT Chest due 04/2019  ROS: See pertinent positives and negatives per HPI.  Past Medical History:  Diagnosis Date  . Arthritis   . COPD (chronic obstructive pulmonary disease) (HCC)   . Depression   . Fibromyalgia   . Irritable bowel syndrome   . MS (multiple sclerosis) (HCC)    Followed by Dr. Sherryll BurgerShah    Past Surgical History:  Procedure Laterality Date  . ABDOMINAL HYSTERECTOMY     CERVIX intact  . CHOLECYSTECTOMY N/A 01/03/2016   Procedure: LAPAROSCOPIC CHOLECYSTECTOMY WITH INTRAOPERATIVE CHOLANGIOGRAM;  Surgeon: Lattie Hawichard E Cooper, MD;  Location: [facility] ORS;  Service: General;  Laterality: N/A;  . ENDOSCOPIC RETROGRADE CHOLANGIOPANCREATOGRAPHY (ERCP) WITH PROPOFOL N/A 01/04/2016   Procedure: ENDOSCOPIC RETROGRADE CHOLANGIOPANCREATOGRAPHY (ERCP) WITH PROPOFOL;  Surgeon: Midge Miniumarren Wohl, MD;  Location: [facility] ENDOSCOPY;  Service: Endoscopy;  Laterality: N/A;  . FOOT SURGERY  2001  . LAPAROSCOPIC ENDOMETRIOSIS FULGURATION      Family History  Problem Relation Age of Onset  . Hypertension Mother   . Hyperlipidemia Mother   . Arthritis Mother   . Cancer Father        throat  . Heart disease Brother   . Bipolar disorder Son   . Schizophrenia Son   . Cancer Maternal Grandfather        liver  . Cancer Paternal Grandmother  liver    SOCIAL HX: smoker   Current Outpatient Medications:  .  acyclovir (ZOVIRAX) 400 MG tablet, Take 1 tablet (400 mg total) by mouth daily., Disp: 90 tablet, Rfl: 3 .  Albuterol Sulfate (PROAIR RESPICLICK) 108 (90 Base) MCG/ACT AEPB, Inhale 90 mcg into the lungs every 6 (six) hours as needed (as needed for cough, wheezing). (Patient taking differently: Inhale 180 mcg into the lungs every 6 (six)  hours as needed (as needed for cough, wheezing). ), Disp: 1 each, Rfl: 2 .  aspirin 81 MG tablet, Take 81 mg by mouth daily., Disp: , Rfl:  .  baclofen (LIORESAL) 10 MG tablet, Take 1 tablet (10 mg total) by mouth 3 (three) times daily as needed for muscle spasms., Disp: 30 each, Rfl: 4 .  carbamazepine (TEGRETOL) 200 MG tablet, Take 200 mg by mouth 2 (two) times daily., Disp: , Rfl:  .  cholecalciferol (VITAMIN D) 1000 units tablet, Take 1,000 Units by mouth daily., Disp: , Rfl:  .  diclofenac sodium (VOLTAREN) 1 % GEL, Apply 4 g topically 4 (four) times daily., Disp: 1 Tube, Rfl: 3 .  Dimethyl Fumarate 240 MG CPDR, Take 240 mg by mouth 2 (two) times daily., Disp: , Rfl:  .  escitalopram (LEXAPRO) 20 MG tablet, Take 1 tablet (20 mg total) by mouth daily., Disp: 90 tablet, Rfl: 1 .  Fluticasone-Salmeterol (ADVAIR DISKUS) 100-50 MCG/DOSE AEPB, Inhale 1 puff into the lungs 2 (two) times daily as needed (for shortness of breath)., Disp: 60 each, Rfl: 3 .  lisinopril (PRINIVIL,ZESTRIL) 10 MG tablet, Take 1 tablet (10 mg total) by mouth daily., Disp: 90 tablet, Rfl: 4 .  Multiple Vitamins-Minerals (CENTRUM SILVER ULTRA WOMENS) TABS, Take 1 tablet by mouth daily., Disp: , Rfl:  .  ondansetron (ZOFRAN-ODT) 4 MG disintegrating tablet, Take 1 tablet (4 mg total) by mouth every 6 (six) hours as needed for nausea or vomiting., Disp: 20 tablet, Rfl: 1 .  rosuvastatin (CRESTOR) 10 MG tablet, Take 1 tablet (10 mg total) by mouth daily., Disp: 90 tablet, Rfl: 3 .  Fluticasone-Salmeterol (ADVAIR DISKUS) 250-50 MCG/DOSE AEPB, Inhale 1 puff into the lungs 2 (two) times daily., Disp: 1 each, Rfl: 3  EXAM:  VITALS per patient if applicable:  GENERAL: alert, oriented, appears well and in no acute distress  HEENT: atraumatic, conjunttiva clear, no obvious abnormalities on inspection of external nose and ears  NECK: normal movements of the head and neck  LUNGS: on inspection no signs of respiratory distress,  breathing rate appears normal, no obvious gross SOB, gasping or wheezing  CV: no obvious cyanosis  MS: moves all visible extremities without noticeable abnormality  PSYCH/NEURO: pleasant and cooperative, no obvious depression or anxiety, speech and thought processing grossly intact  ASSESSMENT AND PLAN:  Discussed the following assessment and plan:  COPD, moderate (HCC) - Plan: Fluticasone-Salmeterol (ADVAIR DISKUS) 250-50 MCG/DOSE AEPB  Fibromyalgia - Plan: baclofen (LIORESAL) 10 MG tablet  Atherosclerosis of aorta (HCC) - Plan: rosuvastatin (CRESTOR) 10 MG tablet  Herpes simplex vulvovaginitis - Plan: acyclovir (ZOVIRAX) 400 MG tablet  Generalized anxiety disorder - Plan: escitalopram (LEXAPRO) 20 MG tablet  Chest pain, unspecified type  Muscle spasm  Problem List Items Addressed This Visit      Cardiovascular and Mediastinum   Atherosclerosis of aorta (HCC)   Relevant Medications   rosuvastatin (CRESTOR) 10 MG tablet     Respiratory   COPD, moderate (HCC) - Primary    Some increased shortness of breath, cough of  late.  The symptom appears intermittent.  Discussed at length smoking cessation and its impact on progression of disease.  Will trial increase of Advair.  Close follow-up in 2 months.      Relevant Medications   Fluticasone-Salmeterol (ADVAIR DISKUS) 250-50 MCG/DOSE AEPB     Genitourinary   Genital herpes   Relevant Medications   acyclovir (ZOVIRAX) 400 MG tablet     Other   Fibromyalgia   Relevant Medications   baclofen (LIORESAL) 10 MG tablet   escitalopram (LEXAPRO) 20 MG tablet   Generalized anxiety disorder    Unsure if muscle spasm related to MS or anxiety. Will start with baclofen as needed. Patient will let me know if resolves, and if not, we will discuss adjunctive therapy for anxiety. Follow up in 2 months      Relevant Medications   escitalopram (LEXAPRO) 20 MG tablet   Chest pain    Continues.  Does not appear exertional.  Frequency has  decreased.  Patient declines being seen in the emergency room for further evaluation today.  She feels related to her multiple sclerosis. Have spoken with Dr Jari Sportsman office      Muscle spasm    Symptom most consistent with muscle spasm. Trial of baclofen. Patient will let me know how she is doing           I discussed the assessment and treatment plan with the patient. The patient was provided an opportunity to ask questions and all were answered. The patient agreed with the plan and demonstrated an understanding of the instructions.   The patient was advised to call back or seek an in-person evaluation if the symptoms worsen or if the condition fails to improve as anticipated.   Rennie Plowman, FNP

## 2019-05-21 NOTE — Assessment & Plan Note (Signed)
Some increased shortness of breath, cough of late.  The symptom appears intermittent.  Discussed at length smoking cessation and its impact on progression of disease.  Will trial increase of Advair.  Close follow-up in 2 months.

## 2019-05-21 NOTE — Progress Notes (Signed)
I spoke with Morrie Sheldon at Dr. Jari Sportsman office & she is sending a message back to Dr. Jari Sportsman nurse to call patient. My concern making an appointment was it [place] not work for patient's schedule.

## 2019-05-21 NOTE — Assessment & Plan Note (Addendum)
Continues.  Does not appear exertional.  Frequency has decreased.  Patient declines being seen in the emergency room for further evaluation today.  She feels related to her multiple sclerosis. Have spoken with Dr Jari Sportsman office and asked them to call patient for triage/follow up appointment.

## 2019-05-21 NOTE — Telephone Encounter (Signed)
Called pt x2. Unable lmom pt phone rings out then stops.

## 2019-05-21 NOTE — Patient Instructions (Addendum)
  Debra Hogan , Thank you for taking time to come for your Medicare Wellness Visit. I appreciate your ongoing commitment to your health goals. Please review the following plan we discussed and let me know if I can assist you in the future.   These are the goals we discussed: Goals      Patient Stated   . Spot check blood pressure as defined by provider (pt-stated)     Less than 140/90       This is a list of the screening recommended for you and due dates:  Health Maintenance  Topic Date Due  . Flu Shot  07/26/2019  . Pap Smear  04/19/2021  . Colon Cancer Screening  09/24/2021  . Tetanus Vaccine  05/13/2028  .  Hepatitis C: One time screening is recommended by Center for Disease Control  (CDC) for  adults born from [birth year] through 1967.   Completed  . HIV Screening  Completed  . Mammogram  Discontinued

## 2019-05-22 NOTE — Telephone Encounter (Signed)
Spoke with aptient. States he chest pain Pascual come and go over a particular day and then she Quesnell go a month with no pain.  She is not having chest pain at this time. She last saw Dr Kirke Corin in January and is to f/u in 6 months. We decided to go ahead and schedule her for VV next week with Alycia Rossetti. She was agreeable. MyChart consent sent to patient.

## 2019-05-23 ENCOUNTER — Encounter: Payer: Self-pay | Admitting: Family

## 2019-05-23 ENCOUNTER — Telehealth: Payer: Self-pay | Admitting: Family

## 2019-05-23 NOTE — Telephone Encounter (Signed)
Call pt Has she made f/u appt with Dr Jari Sportsman office for chest pain?

## 2019-05-23 NOTE — Telephone Encounter (Signed)
LMTCB. It appears that Dr. Jari Sportsman office has not called. I asked that shew call us back & I can try to schedule her an appointment with them.

## 2019-05-26 NOTE — Telephone Encounter (Signed)
It appears that patient was contacted by cardiology, but did not respond to message for consent to virtual visit.

## 2019-05-28 ENCOUNTER — Telehealth: Payer: Self-pay | Admitting: Cardiovascular Disease

## 2019-05-28 NOTE — Telephone Encounter (Signed)
Virtual Visit Pre-Appointment Phone Call  "(Name), I am calling you today to discuss your upcoming appointment. We are currently trying to limit exposure to the virus that causes COVID-19 by seeing patients at home rather than in the office."  1. "What is the BEST phone number to call the day of the visit?" - include this in appointment notes  2. "Do you have or have access to (through a family member/friend) a smartphone with video capability that we can use for your visit?" a. If yes - list this number in appt notes as "cell" (if different from BEST phone #) and list the appointment type as a VIDEO visit in appointment notes b. If no - list the appointment type as a PHONE visit in appointment notes  3. Confirm consent - "In the setting of the current Covid19 crisis, you are scheduled for a (phone or video) visit with your provider on (date) at (time).  Just as we do with many in-office visits, in order for you to participate in this visit, we must obtain consent.  If you'd like, I can send this to your mychart (if signed up) or email for you to review.  Otherwise, I can obtain your verbal consent now.  All virtual visits are billed to your insurance company just like a normal visit would be.  By agreeing to a virtual visit, we'd like you to understand that the technology does not allow for your provider to perform an examination, and thus Mcnicholas limit your provider's ability to fully assess your condition. If your provider identifies any concerns that need to be evaluated in person, we will make arrangements to do so.  Finally, though the technology is pretty good, we cannot assure that it will always work on either your or our end, and in the setting of a video visit, we [place] have to convert it to a phone-only visit.  In either situation, we cannot ensure that we have a secure connection.  Are you willing to proceed?" STAFF: Did the patient verbally acknowledge consent to telehealth visit? Document  YES/NO here: YES  4. Advise patient to be prepared - "Two hours prior to your appointment, go ahead and check your blood pressure, pulse, oxygen saturation, and your weight (if you have the equipment to check those) and write them all down. When your visit starts, your provider will ask you for this information. If you have an Apple Watch or Kardia device, please plan to have heart rate information ready on the day of your appointment. Please have a pen and paper handy nearby the day of the visit as well."  5. Give patient instructions for MyChart download to smartphone OR Doximity/Doxy.me as below if video visit (depending on what platform provider is using)  6. Inform patient they will receive a phone call 15 minutes prior to their appointment time ([place] be from unknown caller ID) so they should be prepared to answer    TELEPHONE CALL NOTE  Debra Hogan has been deemed a candidate for a follow-up tele-health visit to limit community exposure during the Covid-19 pandemic. I spoke with the patient via phone to ensure availability of phone/video source, confirm preferred email & phone number, and discuss instructions and expectations.  I reminded Debra Hogan to be prepared with any vital sign and/or heart rhythm information that could potentially be obtained via home monitoring, at the time of her visit. I reminded Debra Hogan to expect a phone call prior to  her visit.  [name] 05/28/2019 2:50 PM   INSTRUCTIONS FOR DOWNLOADING THE MYCHART APP TO SMARTPHONE  - The patient must first make sure to have activated MyChart and know their login information - If Apple, go to [employer] and type in MyChart in the search bar and download the app. If Android, ask patient to go to [employer] and type in [place] in the search bar and download the app. The app is free but as with any other app downloads, their phone Sass require them to verify saved payment information or Apple/Android password.    - The patient will need to then log into the app with their MyChart username and password, and select [facility] as their healthcare provider to link the account. When it is time for your visit, go to the MyChart app, find appointments, and click Begin Video Visit. Be sure to Select Allow for your device to access the Microphone and Camera for your visit. You will then be connected, and your provider will be with you shortly.  **If they have any issues connecting, or need assistance please contact MyChart service desk [phone])**  **If using a computer, in order to ensure the best quality for their visit they will need to use either of the following Internet Browsers: [employer], or Google Chrome**  IF USING DOXIMITY or DOXY.ME - The patient will receive a link just prior to their visit by text.     FULL LENGTH CONSENT FOR TELE-HEALTH VISIT   I hereby voluntarily request, consent and authorize [facility] and its employed or contracted physicians, physician assistants, nurse practitioners or other licensed health care professionals (the Practitioner), to provide me with telemedicine health care services (the "Services") as deemed necessary by the treating Practitioner. I acknowledge and consent to receive the Services by the Practitioner via telemedicine. I understand that the telemedicine visit will involve communicating with the Practitioner through live audiovisual communication technology and the disclosure of certain medical information by electronic transmission. I acknowledge that I have been given the opportunity to request an in-person assessment or other available alternative prior to the telemedicine visit and am voluntarily participating in the telemedicine visit.  I understand that I have the right to withhold or withdraw my consent to the use of telemedicine in the course of my care at any time, without affecting my right to future care or treatment, and that  the Practitioner or I Roszak terminate the telemedicine visit at any time. I understand that I have the right to inspect all information obtained and/or recorded in the course of the telemedicine visit and Julius receive copies of available information for a reasonable fee.  I understand that some of the potential risks of receiving the Services via telemedicine include:  Marland Kitchen Delay or interruption in medical evaluation due to technological equipment failure or disruption; . Information transmitted [place] not be sufficient (e.g. poor resolution of images) to allow for appropriate medical decision making by the Practitioner; and/or  . In rare instances, security protocols could fail, causing a breach of personal health information.  Furthermore, I acknowledge that it is my responsibility to provide information about my medical history, conditions and care that is complete and accurate to the best of my ability. I acknowledge that Practitioner's advice, recommendations, and/or decision Zaman be based on factors not within their control, such as incomplete or inaccurate data provided by me or distortions of diagnostic images or specimens that Zelenak result from electronic transmissions. I  understand that the practice of medicine is not an exact science and that Practitioner makes no warranties or guarantees regarding treatment outcomes. I acknowledge that I will receive a copy of this consent concurrently upon execution via email to the email address I last provided but Erlandson also request a printed copy by calling the office of [place].    I understand that my insurance will be billed for this visit.   I have read or had this consent read to me. . I understand the contents of this consent, which adequately explains the benefits and risks of the Services being provided via telemedicine.  . I have been provided ample opportunity to ask questions regarding this consent and the Services and have had my questions answered to  my satisfaction. . I give my informed consent for the services to be provided through the use of telemedicine in my medical care  By participating in this telemedicine visit I agree to the above.

## 2019-06-02 NOTE — Telephone Encounter (Signed)
Call pt  See below messages She is seeing cardiologist in July.  Please advise her to call and maker sooner appointment  Please ensure she has not any new or worsening features of chest pain. Ensure it feels as she felt when she saw Dr [facility] Anon 12/2018.  Seen by [facility] Anon 12/2018

## 2019-06-11 NOTE — Telephone Encounter (Signed)
I spoke with patient & she stated that she will call to see if she can move up appointment. She also stated that the chest pain was actually better than had been previously.

## 2019-06-16 NOTE — Unmapped (Signed)
[facility] Refill Coordination Note    Specialty Medication(s) to be Shipped:   Neurology: Jennifer Lewis, DOB: 1961/09/02  Phone: [phone] (home)       All above HIPAA information was verified with patient.     Completed refill call assessment today to schedule patient's medication shipment from the [facility] [phone]).       Specialty medication(s) and dose(s) confirmed: Regimen is correct and unchanged.   Changes to medications: Julianna reports no changes at this time.  Changes to insurance: No  Questions for the pharmacist: No    Confirmed patient received Welcome Packet with first shipment. The patient will receive a drug information handout for each medication shipped and additional FDA Medication Guides as required.       DISEASE/MEDICATION-SPECIFIC INFORMATION        N/A    SPECIALTY MEDICATION ADHERENCE     Medication Adherence    Specialty Medication:  tecfidera 240 mg  Patient is on additional specialty medications:  No  Patient is on more than two specialty medications:  No  Any gaps in refill history greater than 2 weeks in the last 3 months:  no  Demonstrates understanding of importance of adherence:  yes  Informant:  patient  Reliability of informant:  reliable  Provider-estimated medication adherence level:  good  Adherence tools used:  patient uses a pill box to manage medications   Other adherence tool:  leaves medicine on dining table to take with meals   Confirmed plan for next specialty medication refill:  delivery by pharmacy          Refill Coordination    Has the Patients' Contact Information Changed:  No  Is the Shipping Address Different:  No           tecfidera 240 mg  : 09 days of medicine on hand       SHIPPING     Shipping address confirmed in Epic.     Delivery Scheduled: Yes, Expected medication delivery date: 06/17/2019.     Medication will be delivered via Same Day Courier to the home address in Epic [place].    Jorje Guild [facility] Specialty Technician

## 2019-06-16 NOTE — Unmapped (Signed)
The [facility] has made a third and final attempt to reach this patient to refill the following medication:Tecfidera.      We have left voicemails on the following phone numbers: [phone].    Dates contacted: 6/10, 6/17, 6/22  Last scheduled delivery: 05/19    The patient T[place]on be at risk of non-compliance with this medication. The patient should call the [facility] at [phone] (option 4) to refill medication.    [name]   [facility] Specialty Technician

## 2019-06-17 ENCOUNTER — Encounter: Admit: 2019-06-17 | Discharge: 2019-06-18 | Payer: MEDICARE | Attending: Neurology | Primary: Neurology

## 2019-06-17 DIAGNOSIS — M792 Neuralgia and neuritis, unspecified: Secondary | ICD-10-CM

## 2019-06-17 DIAGNOSIS — I671 Cerebral aneurysm, nonruptured: Secondary | ICD-10-CM

## 2019-06-17 DIAGNOSIS — G35 Multiple sclerosis: Principal | ICD-10-CM

## 2019-06-17 MED FILL — TECFIDERA 240 MG CAPSULE,DELAYED RELEASE: 30 days supply | Qty: 60 | Fill #9 | Status: AC

## 2019-06-17 MED FILL — TECFIDERA 240 MG CAPSULE,DELAYED RELEASE: ORAL | 30 days supply | Qty: 60 | Fill #9

## 2019-06-17 NOTE — Unmapped (Signed)
[facility] [place] [facility] at [facility]  Multiple Sclerosis/Neuroimmunology Division  Sarita Bottom, MD  Associate Professor of Neurology    DATE OF VISIT 06/17/2019    Re:  Jennifer Lewis  [address]  [place] [address]  MRN: [id number]  DOB: 18-Aug-1961      Visit: Patient visit was performed over the video in the setting of  COVID-19 Pandemic.     DATE OF LAST VISIT: 01/02/2018    Assessment:     1. I introduced myself and confirmed the identity of the patient.  2. took a detailed interval history of the present illness fromMs. Alisse D Lewis , details on past medical history, family history and social history.  3. I personally reviewed  patient's interval medical records, interval MRI images  and have discussed  MRI findings with the patient.   4. I performed medication reconciliation  5.  The diagnostic and treatment plan were discussed with  Jennifer Lewis , who agreed with the recommended diagnostic and treatment plan                                                                                                                                                                                                                                     ?? Multiple sclerosis:  [name], is a 58 y.o. Caucasian  female, who has been diagnosed with RRMS. She was on Tecfidera since 2015 and stopped in Nov/2017 due to financial issues. She re-started Tecfidera in Nave 2018 and tolerates it well. She as last time seen on  01/02/2018 and performed MRI studies in 01/2019 - brain MRI showed a stable lesion pattern compared with August 2018, with no lesions on the C and T spinal cord. She reports a progressive fatigability since that time. We are considering treatment switch due to this progression, but I would prefer to see the patient in the office to perform an in-person exam first. She has been relapse-free since the last visit on 01/02/2018.  Please see the detailed plan below.     ?? Intracranial aneurysms:  Follow-up with neurosurgery. Aneurysms could contribute to headaches.     Plan:     1.  Brain, C/T spine MRI with and w/o contrast  2. Continue Baclofen as needed for muscle spasms: takes 10 mg -20 mg/day  , mostly nightly  3. Continue  Carbamazepine  200 mg BID for the neuropathic pain treatment and paroxysmal sensory MS symptoms. If the pain is getting worse Bunting increase to TID after seeing liver results.   4. Continue  VitamiN D 2000 IU/.day  5. Continue Tecfidera 240 mg BID  6. CBC/diff, CMP, Vitamin D 25OH: with the PCP  7. Office visit with me in August 2020.  8. Continue treatment per neurosurgery  9. Explore options for bars in the bathroom, to reduce risk of falls.     Subjective:     CHIEF COMPLAINTS: pain, fatigue, balance problems, vision problems.    HISTORY OF PRESENT ILLNESS:   Jennifer Lewis, is a 58 y.o. Caucasian  female, who has been diagnosed with RRMS. She was treated since 2011 (per notes) for fibromyalgia. Her fist MS symptom seems to be pain in her legs in summer 2015.  MRI was done that showed lesions. Has not received steroids. She was on Lyrica that stopped in 2016 because of financial reasons. Had weight gain with Lyrica, too. She was on Tecfidera since 2015 and stopped in Nov/2017 due to financial issues. She tolerated Tecfidera well. Takes Baclofen PRN, for spasms, if she needs to walk for a quarter of a mile , she usees a cane, gets fatigued after about 200 feet. Fatigue is present since 2010.  Did not take any medications for fatigue.  Has bladder urgency, takes Ditropan that helps.  She is sometimes constipated. Has bilateral paroxysmal leg spasms when she lies down since 2011. She restarted with Tecfidera in Beeney 2018, tolerates well. No flushings.    Please se my last  Clinic note dated 01/02/2018  for more details on the HPI.      MS DMD history:  Tecfidera: 2015 and stopped in Nov/2017 due to financial issues  Tecfidera: re-started Totty 2018-ongoing      INTERVAL HISTORY SINCE THE LAST VISIT ON 01/02/2018:  New neurological symptoms since the last visit: NO relapses  She is walking with a rollator because of lung problems (emphysema). Uses inhaler.   Overall functionality has changed by 30 % since last year, she is more fatigued now.   Bladder control: Can not hold it.   Bowel control: good  Vision: fluctuates, has eye pain and hen that happens things get blurry, she is light sensitive. Like a piercing pain behind the eye balls. Optometrist suggested MRI of the eyes. Vision problems comes and goes. She thinks eye pressure was measured, but is not sure. Mother has glaucoma.   Balance is not the best.   Pain: She feels like she is having more pain , pain in the eyes and sides. Occasional pain level 5/6, Ruane be 10/10.   She did not have any weight loss.   Once had a fall and several times almost had a fall several in a tub, got a shower seat.       COVID-19 screening questions:  performed, negative.     ??      Have you travelled over the last 14 weeks? NO  ??      Have you been in close contact with anyone who tested positive for COVID19? NO   ??      Do you have new muscle aches not related to another medical condition or another specific activity (e.g. due to physical exercise)? (If so, take temperature) NO  ??        Do you feel like you Mcglaun have a temperature  of greater than 100.0??F? (If so, take temperature) NO  ??        Do you have sore throat not related to another medical condition (e.g. allergies)? NO  ??        Do you have a new or worsening cough that is not related to another medical condition? NO  ??        Do you have shortness of breath that is not attributable to another medical condition? NO  ??        Do you have recent (<5 days) loss of smell and taste? NO  ??        Do you have new onset of vomiting or diarrhea not related to another medical condition? NO  ??        Do you have new onset of vomiting or diarrhea not related to another medical condition?NO- she has been having chronicoccasional diarrhea, but does not think it was caused by Tecfidera.     ............................................................................................................................................Marland Kitchen  DIAGNOSTIC STUDIES / REVIEW OF INTERVAL MEDICAL RECORDS:    MRI:  05/29/2015: Brain MRI with and w/o contrast, report: Multiple white matter lesions in a distribution consistent with multiple sclerosis. No enhancing lesions.:   02/17/2017: Brain MRI with and w/o contrast, report:Stable burden of the disease with multiple white matter lesions as detailed above. No new enhancing lesions are visualized. There is a 8 mm bilobed aneurysm of the paraclinoid left internal carotid artery that has been present since 2015 comparison.  04/01/2017: MRA head with and w/o contrast: Unchanged appearance of bilateral ICA aneurysms as above. COMPARISON: MRI brain from 02/17/2017.  08/22/2017: Brain MRI with and w/o contrast and brain MRA report (COMPARISON: Brain MRI 02/17/2017. Brain MRA 04/01/2017. Cerebral angiogram 05/17/2017): -Interval left ICA pipeline stent placement. Evolving thrombus within the occluded left ophthalmic artery aneurysm. No evidence of flow within the aneurysm. -Unchanged right cavernous internal carotid artery aneurysm measuring 4 mm. -Numerous T2/flair hyperintensities within the subcortical and periventricular white matter are consistent with the known history of multiple sclerosis. There is a single new lesion within the left periventricular white matter when compared to the most recent MRI 02/17/2017.  01/27/2018 : Brain MRI w/o contrast, report (COMPARISON: MRI brain 08/22/2017); Unchanged bilateral white matter hyperintensities, which are consistent with the known history of multiple sclerosis.  01/27/2018 : Cervical spine MRI w/o contrast, report: -- Multilevel degenerative changes over the cervical spine, most pronounced with severe left neural foraminal narrowing at the C4-C5 and severe right neural foraminal narrowing at C5-C6.  --No abnormal signal intensity in the spinal cord.     01/27/2018 : Thoracic spine MRI w/o contrast, report: - Multilevel mild degenerative changes of the thoracic spine. -No abnormal signal intensity in the spinal cord.    Lumbar puncture:   Per patient report, she did the spinal tap and the results were  the confirmation for MS     Blood tests:   Last:  08/09/2018: CMP: WNl except CO2 34 (H), glc 100 (H).   .08/09/2018: CBC: WNL except RBC 3.7 (L), Hgb 11.9 (L), Hct 35.1 (L). Diff WNl except: Ly 9.5% (L), ANC 0.4 (L), Mo% 12.3 (H).      Office Visit on 05/08/2017   Component Date Value Ref Range Status   ??? Angio Convert Enzyme 05/08/2017 47  8 - 53 U/L Final   ??? Antinuclear Antibodies (ANA) 05/08/2017 Positive* Negative Final   ??? Pattern 1 (ANA) 05/08/2017 Speckled   Final   ??? ANA Titer  1 05/08/2017 1:80   Final   ??? Pattern 2 (ANA) 05/08/2017 Homogenous   Final   ??? ANA Titer 2 05/08/2017 1:80   Final   ??? Vitamin D Total (25OH) 05/08/2017 38.7  20.0 - 80.0 ng/mL Final     05/10/15: RF, vitamin B12; WNL.    06/26/16: TSH 5.96 (ref. 0.600 - 3.300 uIU/mL).       Other:   04/27/2017: seen Dr Judie Petit. Wang for the evaluation of cerebral aneurysms.  05/01/2017: Cerebral angiogram: 1. Superiorly projecting 7 x 6 mm left ophthalmic aneurysm. 2. Inferiorly projecting 5 x 3 right posterior communicating aneurysm. 3. 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  12/28/2017: IR CEREBRAL ARTERIOGRAM: Left internal carotid artery injection demonstrates good opacification of the left middle and anterior cerebral arteries. The pipeline treated left ophthalmic aneurysm is completely obliterated. No stenosis within the pipeline treated segment of the left internal carotid artery.Right internal carotid artery injection demonstrates an unchanged 4 mm aneurysm arising from the communicating segment of the right internal carotid artery.Left vertebral artery injection demonstrates unchanged arteriovenous shunting adjacent to the right side of the tentorium with arterial supply from the right posterior cerebral artery and venous drainage to the right transverse sinus.  ............................................................................................................................................        Past Medical History:  Past Medical History:   Diagnosis Date   ??? Aneurysm (CMS-HCC)    ??? Anxiety    ??? COPD (chronic obstructive pulmonary disease) (CMS-HCC)    ??? Depression    ??? Fibromyalgia    ??? Fibromyalgia, primary 2011   ??? Headache, tension-type 2011    Worse and more frequent in the past 4-5 months.   ??? MS (multiple sclerosis) (CMS-HCC)    ??? Restless leg syndrome    ??? Sleep apnea, obstructive 2016    Sleep study done.  Used a CPAP.   ??? Stroke (CMS-HCC) 2005       ALLERGIES:  No Known Allergies    CURRENT MEDICATIONS:    Current Outpatient Medications   Medication Sig Dispense Refill   ??? acyclovir (ZOVIRAX) 400 MG tablet Take 400 mg by mouth nightly.      ??? albuterol sulfate (PROAIR RESPICLICK) 90 mcg/actuation AePB Inhale 180 mcg Every four (4) hours.     ??? aspirin 81 MG chewable tablet Chew 1 tablet (81 mg total) daily. 30 tablet 0   ??? baclofen (LIORESAL) 10 MG tablet Take 1 tablet (10 mg total) by mouth Three (3) times a day as needed for muscle spasms. 30 tablet 5   ??? carBAMazepine (TEGRETOL) 200 mg tablet Take 1 tablet (200 mg total) by mouth Two (2) times a day. 60 tablet 2   ??? cholecalciferol, vitamin D3, (VITAMIN D3) 1,000 unit capsule Take 1,000 Units by mouth daily.     ??? dimethyl fumarate 240 mg CpDR TAKE 1 CAPSULE BY MOUTH TWICE DAILY WITH MEALS 60 capsule 2   ??? escitalopram oxalate (LEXAPRO) 10 MG tablet Take 10 mg by mouth daily.      ??? fluticasone-salmeterol (ADVAIR) 100-50 mcg/dose diskus Inhale 1 puff Two (2) times a day.     ??? lisinopril (PRINIVIL,ZESTRIL) 10 MG tablet Take 1 tablet (10 mg total) by mouth daily. 30 tablet 0   ??? multivit,iron,minerals/lutein (CENTRUM SILVER ULTRA WOMEN'S ORAL) Take 1 tablet by mouth daily.     ??? ondansetron (ZOFRAN-ODT) 4 MG disintegrating tablet Take 4 mg by mouth every six (6) hours as needed for nausea.     ???  rosuvastatin (CRESTOR) 10 MG tablet Take 10 mg by mouth daily.       No current facility-administered medications for this visit.            Past Surgical History:    Past Surgical History:   Procedure Laterality Date   ??? CHOLECYSTECTOMY     ??? colonoscopy     ??? HYSTERECTOMY     ??? IR EMBOLIZATION INTRACRANIAL SPINAL  05/17/2017    IR EMBOLIZATION INTRACRANIAL SPINAL 05/17/2017 Altamease Oiler, MD IMG [facility]IR [facility]   ??? IR EMBOLIZATION INTRACRANIAL SPINAL  07/09/2018    IR EMBOLIZATION INTRACRANIAL SPINAL 07/09/2018 Altamease Oiler, MD IMG VIR [facility]       Social History:    Social History     Socioeconomic History   ??? Marital status: Married     Spouse name: None   ??? Number of children: None   ??? Years of education: None   ??? Highest education level: None   Occupational History   ??? None   Social Needs   ??? Financial resource strain: None   ??? Food insecurity     Worry: None     Inability: None   ??? Transportation needs     Medical: None     Non-medical: None   Tobacco Use   ??? Smoking status: Current Every Day Smoker     Packs/day: 1.00     Years: 36.00     Pack years: 36.00     Types: Cigarettes   ??? Smokeless tobacco: Never Used   ??? Tobacco comment: Has rx prescription for Chantix, not using yet   Substance and Sexual Activity   ??? Alcohol use: No     Alcohol/week: 0.0 standard drinks   ??? Drug use: No   ??? Sexual activity: Yes     Partners: Male     Comment: Not on a regular basis, due to health issues.   Lifestyle   ??? Physical activity     Days per week: None     Minutes per session: None   ??? Stress: None   Relationships   ??? Social Wellsite geologist on phone: None     Gets together: None     Attends religious service: None     Active member of club or organization: None     Attends meetings of clubs or organizations: None     Relationship status: None   Other Topics Concern   ??? Exercise Not Asked   ??? Living Situation Not Asked   Social History Narrative   ??? None       Family History:    Family History   Problem Relation Age of Onset   ??? Cancer Father    ??? Hypertension Brother    ??? Heart disease Brother    ??? Cancer Maternal Grandfather    ??? Cancer Paternal Grandfather    ??? Cancer Paternal Uncle    ??? COPD Brother    ??? Heart attack Brother    ??? Stroke Mother    ??? Stroke Maternal Grandmother    ??? Thyroid disease Maternal Grandmother    ??? Multiple sclerosis Neg Hx    ??? Aneurysm Neg Hx         Review of Systems:  A 10-systems review was performed over the phone and, unless otherwise noted, declared negative by patient.      Objective:       Physical Exam:  General Appearance: in no acute distress. Normal skin color.     NEUROLOGICAL EXAMINATION:     General:  Alert and oriented to person, place, time and situation.      Language and spontaneous speech normal, no dysarthria or aphasia.  naming/fluency/repetition intact.  Fund of knowledge normal.  Following lateralizing commands across midline       Cranial Nerves:     II, III- Reports normal vision. Visual Acuity:   III, IV, VI- extra ocular movements are intact, No ptosis, no diplopia, no nystagmus.  V- does not reports facial sensation abnormality.   VII- face symmetrical, no facial droop, normal facial movements with smile/grimace  VIII- Reports hearing to be grossly intact.   IX and X- no dysarthria or dysphagia  XI- no torticollis  XII- No tongue atrophy, \tongue protrudes midline, full range of movements of the tongue.      Motor Exam:   Position test UE: no pronator drift b/l.   LE:UTA. Able to stand up from the chair.     Sensory system: does not report sensory impairment for light touch.    Cerebellar/Coordination:  Rapid alternating movements, finger-to-nose bilaterally demonstrate no abnormalities. No limb ataxia bilaterally. No gait ataxia. Romberg : mildly positive Performs a tandem gait with mild difficulty.    Gait: Able to walk without assistance.  Able to walk on toes, heels without difficulty.   .........................................................................................................................................Marland Kitchen    VISIT SUMMARY:  Patient visit was performed over the video in the setting of  COVID-19 Pandemic.     I spent 35 minutes on the audio/video with the patient. I spent an additional 11 minutes on pre- and post-visit activities.     The patient was physically located in [place] or a state in which I am permitted to provide care. The patient and/or parent/guardian understood that s/he Visscher incur co-pays and cost sharing, and agreed to the telemedicine visit. The visit was reasonable and appropriate under the circumstances given the patient's presentation at the time.    The patient and/or parent/guardian has been advised of the potential risks and limitations of this mode of treatment (including, but not limited to, the absence of in-person examination) and has agreed to be treated using telemedicine. The patient's/patient's family's questions regarding telemedicine have been answered.     If the phone/video visit was completed in an ambulatory setting, the patient and/or parent/guardian has also been advised to contact their provider???s office for worsening conditions, and seek emergency medical treatment and/or call 911 if the patient deems either necessary.     The patient voiced understanding that  they should call the Neurology Clinic [phone]) in case of worsening neurological symptoms and that they  should call the PCP (if no PCP or not able to reach PCP- call the [facility] at [phone]) in case of a suspicion of COVID-19. COVID-19 symptoms discussed with the patient.

## 2019-06-20 ENCOUNTER — Telehealth: Payer: Self-pay

## 2019-06-20 ENCOUNTER — Encounter: Payer: Self-pay | Admitting: *Deleted

## 2019-06-20 NOTE — Telephone Encounter (Signed)
Call pt regarding lung screening. Unable to leave message no vmx.

## 2019-06-22 MED ORDER — DIMETHYL FUMARATE 240 MG CAPSULE,DELAYED RELEASE
ORAL_CAPSULE | ORAL | 2 refills | 0 days | Status: CP
Start: 2019-06-22 — End: ?
  Filled 2019-07-17: qty 60, 30d supply, fill #0

## 2019-06-22 MED ORDER — BACLOFEN 10 MG TABLET
ORAL_TABLET | Freq: Three times a day (TID) | ORAL | 5 refills | 0.00000 days | Status: CP | PRN
Start: 2019-06-22 — End: ?

## 2019-06-22 MED ORDER — CARBAMAZEPINE 200 MG TABLET
ORAL_TABLET | Freq: Two times a day (BID) | ORAL | 2 refills | 0 days | Status: CP
Start: 2019-06-22 — End: ?

## 2019-06-23 ENCOUNTER — Encounter: Payer: Self-pay | Admitting: Family

## 2019-06-23 ENCOUNTER — Telehealth: Payer: Self-pay

## 2019-06-23 ENCOUNTER — Other Ambulatory Visit: Payer: Self-pay | Admitting: Family

## 2019-06-23 ENCOUNTER — Encounter: Payer: Self-pay | Admitting: *Deleted

## 2019-06-23 DIAGNOSIS — I10 Essential (primary) hypertension: Secondary | ICD-10-CM

## 2019-06-23 NOTE — Telephone Encounter (Signed)
I called patient in regards to mychart message & she was fine with labs that were ordered. She has been scheduled for the afternoon of 7/15 due to her not getting up until after lunch.

## 2019-06-23 NOTE — Unmapped (Addendum)
?     In case of:  ? a suspected relapse (new symptoms or worsening existing symptoms, lasting for >24h)  OR  ? a need for an additional appointment for other reasons  OR   ? if you have other questions: please contact:      [facility] Desk  Phone: [phone]     ? If you have questions for our  pharmacist, please call:      Worthy Flank, PharmD, CPP  Phone: [phone]     ? If you need financial assistance, please call:      Financial Assistance Unit      Phone: [phone]            It was a pleasure to see you today!    Sarita Bottom, MD  Clinical Associate Professor of Neurology  [facility] of Medicine, Department of Neurology  Multiple Sclerosis/Neuroimmunology Division  [address] [place], [place] [address]  Phone: [phone]   Fax: [phone]    ......................................................................................................................  Our recommendations from today's visit:  1.  Brain, C/T spine MRI with and w/o contrast  2. Continue Baclofen as needed for muscle spasms: takes 10 mg -20 mg/day  , mostly nightly  3. Continue  Carbamazepine  200 mg BID for the neuropathic pain treatment and paroxysmal sensory MS symptoms. If the pain is getting worse Suleiman increase to TID after seeing liver results.   4. Continue  VitamiN D 2000 IU/.day  5. Continue Tecfidera 240 mg BID  6. CBC/diff, CMP, Vitamin D 25OH: with the PCP  7. Office visit with me in August 2020.  8. Continue treatment per neurosurgery

## 2019-06-24 ENCOUNTER — Telehealth: Payer: Self-pay | Admitting: *Deleted

## 2019-06-24 DIAGNOSIS — Z87891 Personal history of nicotine dependence: Secondary | ICD-10-CM

## 2019-06-24 DIAGNOSIS — Z122 Encounter for screening for malignant neoplasm of respiratory organs: Secondary | ICD-10-CM

## 2019-06-24 NOTE — Telephone Encounter (Signed)
Patient has been notified that annual lung cancer screening low dose CT scan is due currently or will be in near future. Confirmed that patient is within the age range of 55-77, and asymptomatic, (no signs or symptoms of lung cancer). Patient denies illness that would prevent curative treatment for lung cancer if found. Verified smoking history, (current, 44 pack year). The shared decision making visit was done 04/30/18. Patient is agreeable for CT scan being scheduled.

## 2019-06-24 NOTE — Unmapped (Signed)
Faxed lab orders to PCP. Phone [phone], Fax [phone] . Fax confirmation scanned into Media.    Patient notified, states she already scheduled appointment at PCP office.

## 2019-06-30 NOTE — Telephone Encounter (Signed)
noted 

## 2019-07-03 ENCOUNTER — Ambulatory Visit: Admission: RE | Admit: 2019-07-03 | Payer: Medicare Other | Source: Ambulatory Visit

## 2019-07-08 ENCOUNTER — Other Ambulatory Visit: Payer: Self-pay

## 2019-07-08 ENCOUNTER — Telehealth (INDEPENDENT_AMBULATORY_CARE_PROVIDER_SITE_OTHER): Payer: Medicare Other | Admitting: Cardiovascular Disease

## 2019-07-08 ENCOUNTER — Encounter: Payer: Self-pay | Admitting: Cardiovascular Disease

## 2019-07-08 VITALS — BP 166/97 | HR 97 | Ht 66.0 in | Wt 158.0 lb

## 2019-07-08 DIAGNOSIS — R0789 Other chest pain: Secondary | ICD-10-CM

## 2019-07-08 DIAGNOSIS — I2729 Other secondary pulmonary hypertension: Secondary | ICD-10-CM

## 2019-07-08 DIAGNOSIS — Z720 Tobacco use: Secondary | ICD-10-CM

## 2019-07-08 NOTE — Patient Instructions (Signed)
Medication Instructions:  No change. If you need a refill on your cardiac medications before your next appointment, please call your pharmacy.   Lab work: None If you have labs (blood work) drawn today and your tests are completely normal, you will receive your results only by: Marland Kitchen MyChart Message (if you have MyChart) OR . A paper copy in the mail If you have any lab test that is abnormal or we need to change your treatment, we will call you to review the results.  Testing/Procedures: Schedule echocardiogram in 6 months from now.  Follow-Up: At [facility], you and your health needs are our priority.  As part of our continuing mission to provide you with exceptional heart care, we have created designated Provider Care Teams.  These Care Teams include your primary Cardiologist (physician) and Advanced Practice Providers (APPs -  Physician Assistants and Nurse Practitioners) who all work together to provide you with the care you need, when you need it. You will need a follow up appointment in 6 months.  Please call our office 2 months in advance to schedule this appointment.  You [place] see No primary care provider on file. or one of the following Advanced Practice Providers on your designated Care Team:   Murray Hodgkins, NP Christell Faith, PA-C . Marrianne Mood, PA-C

## 2019-07-08 NOTE — Addendum Note (Signed)
Addended by: Lamar Laundry on: 07/08/2019 04:18 PM   Modules accepted: Orders

## 2019-07-08 NOTE — Progress Notes (Signed)
Virtual Visit via Video Note   This visit type was conducted due to national recommendations for restrictions regarding the COVID-19 Pandemic (e.g. social distancing) in an effort to limit this patient's exposure and mitigate transmission in our community.  Due to her co-morbid illnesses, this patient is at least at moderate risk for complications without adequate follow up.  This format is felt to be most appropriate for this patient at this time.  All issues noted in this document were discussed and addressed.  A limited physical exam was performed with this format.  Please refer to the patient's chart for her consent to telehealth for [facility].   Date:  07/08/2019   ID:  Debra Hogan, DOB 07/17/1961, MRN [id number]  Patient Location: Home Provider Location: Office  PCP:  Allegra GranaArnett, Margaret G, FNP  Cardiologist:  No primary care provider on file.  Electrophysiologist:  None   Evaluation Performed:  Follow-Up Visit  Chief Complaint: Shortness of breath  History of Present Illness:    Debra Hogan is a 58 y.o. female was seen via video visit for a follow-up regarding atypical chest pain, shortness of breath and  moderate pulmonary hypertension.   The patient has chronic medical conditions that include fibromyalgia, multiple sclerosis, tobacco use, COPD, degenerative disc disease with chronic back pain and hyperlipidemia.  She has family history of coronary artery disease and she is a smoker.  She is on home oxygen at night. She had CT scan of the lungs in [place] 2019 which shows small pericardial effusion.   She had Lexiscan Myoview last year which showed no evidence of ischemia with normal ejection fraction. Echocardiogram showed normal LV systolic function with grade 1 diastolic dysfunction, normal RV systolic function and moderately elevated pulmonary pressure with estimated systolic pressure of 49 mmHg.  It was felt that her pulmonary hypertension was likely due to underlying lung  disease.  Since last visit, she has been prescribed oxygen therapy with some improvement in shortness of breath.  However, she continues to smoke 1 pack/day.  She had similar chest pain to what she had before with no recent worsening.   The patient does not have symptoms concerning for COVID-19 infection (fever, chills, cough, or new shortness of breath).    Past Medical History:  Diagnosis Date  . Arthritis   . COPD (chronic obstructive pulmonary disease) (HCC)   . Depression   . Fibromyalgia   . Irritable bowel syndrome   . MS (multiple sclerosis) (HCC)    Followed by Dr. Sherryll BurgerShah   Past Surgical History:  Procedure Laterality Date  . ABDOMINAL HYSTERECTOMY     CERVIX intact  . CHOLECYSTECTOMY N/A 01/03/2016   Procedure: LAPAROSCOPIC CHOLECYSTECTOMY WITH INTRAOPERATIVE CHOLANGIOGRAM;  Surgeon: Lattie Hawichard E Cooper, MD;  Location: [facility] ORS;  Service: General;  Laterality: N/A;  . ENDOSCOPIC RETROGRADE CHOLANGIOPANCREATOGRAPHY (ERCP) WITH PROPOFOL N/A 01/04/2016   Procedure: ENDOSCOPIC RETROGRADE CHOLANGIOPANCREATOGRAPHY (ERCP) WITH PROPOFOL;  Surgeon: Midge Miniumarren Wohl, MD;  Location: [facility] ENDOSCOPY;  Service: Endoscopy;  Laterality: N/A;  . FOOT SURGERY  2001  . LAPAROSCOPIC ENDOMETRIOSIS FULGURATION       Current Meds  Medication Sig  . acyclovir (ZOVIRAX) 400 MG tablet Take 1 tablet (400 mg total) by mouth daily.  . Albuterol Sulfate (PROAIR RESPICLICK) 108 (90 Base) MCG/ACT AEPB Inhale 90 mcg into the lungs every 6 (six) hours as needed (as needed for cough, wheezing). (Patient taking differently: Inhale 180 mcg into the lungs every 6 (six) hours as needed (as needed  for cough, wheezing). )  . aspirin 81 MG tablet Take 81 mg by mouth daily.  . baclofen (LIORESAL) 10 MG tablet Take 1 tablet (10 mg total) by mouth 3 (three) times daily as needed for muscle spasms.  . carbamazepine (TEGRETOL) 200 MG tablet Take 200 mg by mouth 2 (two) times daily.  . cholecalciferol (VITAMIN D) 1000 units  tablet Take 1,000 Units by mouth daily.  . diclofenac sodium (VOLTAREN) 1 % GEL Apply 4 g topically 4 (four) times daily.  . Dimethyl Fumarate 240 MG CPDR Take 240 mg by mouth 2 (two) times daily.  Marland Kitchen escitalopram (LEXAPRO) 20 MG tablet Take 1 tablet (20 mg total) by mouth daily.  . Fluticasone-Salmeterol (ADVAIR DISKUS) 100-50 MCG/DOSE AEPB Inhale 1 puff into the lungs 2 (two) times daily as needed (for shortness of breath).  . Fluticasone-Salmeterol (ADVAIR DISKUS) 250-50 MCG/DOSE AEPB Inhale 1 puff into the lungs 2 (two) times daily.  Marland Kitchen lisinopril (PRINIVIL,ZESTRIL) 10 MG tablet Take 1 tablet (10 mg total) by mouth daily.  . Multiple Vitamins-Minerals (CENTRUM SILVER ULTRA WOMENS) TABS Take 1 tablet by mouth daily.  . ondansetron (ZOFRAN-ODT) 4 MG disintegrating tablet Take 1 tablet (4 mg total) by mouth every 6 (six) hours as needed for nausea or vomiting.  . rosuvastatin (CRESTOR) 10 MG tablet Take 1 tablet (10 mg total) by mouth daily.     Allergies:   Valium [diazepam]   Social History   Tobacco Use  . Smoking status: Current Every Day Smoker    Packs/day: 1.00    Years: 43.00    Pack years: 43.00  . Smokeless tobacco: Never Used  Substance Use Topics  . Alcohol use: No    Alcohol/week: 0.0 standard drinks  . Drug use: No     Family Hx: The patient's family history includes Arthritis in her mother; Bipolar disorder in her son; Cancer in her father, maternal grandfather, and paternal grandmother; Heart disease in her brother; Hyperlipidemia in her mother; Hypertension in her mother; Schizophrenia in her son.  ROS:   Please see the history of present illness.     All other systems reviewed and are negative.   Prior CV studies:   The following studies were reviewed today:  Echocardiogram in December 2019: Normal ejection fraction with systolic pulmonary pressure of 49 mmHg.  Labs/Other Tests and Data Reviewed:    EKG:  No ECG reviewed.  Recent Labs: 08/09/2018: ALT  13; BUN 14; Creatinine, Ser 0.64; Hemoglobin 11.9; Platelets 214.0; Potassium 3.7; Sodium 145; TSH 3.87   Recent Lipid Panel Lab Results  Component Value Date/Time   CHOL 303 (H) 05/08/2018 11:47 AM   TRIG 361.0 (H) 05/08/2018 11:47 AM   HDL 51.70 05/08/2018 11:47 AM   CHOLHDL 6 05/08/2018 11:47 AM   LDLCALC 136 (H) 09/19/2013 04:40 PM   LDLDIRECT 169.0 05/08/2018 11:47 AM    Wt Readings from Last 3 Encounters:  07/08/19 158 lb (71.7 kg)  01/03/19 158 lb (71.7 kg)  11/28/18 159 lb 8 oz (72.3 kg)     Objective:    Vital Signs:  BP (!) 166/97   Pulse 97   Ht 5\' 6"  (1.676 m)   Wt 158 lb (71.7 kg)   BMI 25.50 kg/m    VITAL SIGNS:  reviewed GEN:  no acute distress EYES:  sclerae anicteric, EOMI - Extraocular Movements Intact RESPIRATORY:  normal respiratory effort, symmetric expansion SKIN:  no rash, lesions or ulcers. MUSCULOSKELETAL:  no obvious deformities. NEURO:  alert and  oriented x 3, no obvious focal deficit PSYCH:  normal affect  ASSESSMENT & PLAN:     1.  Atypical chest pain : Negative Lexiscan Myoview last year.  No change in symptoms.    2.  Moderate pulmonary hypertension: This is likely related to underlying lung disease although other etiologies cannot be completely excluded.  I do not think her diastolic dysfunction on echo was significant enough to be contributing to this.  Recommend repeat echocardiogram in 6 months to ensure stability.  3.  Tobacco use: I discussed the importance of smoking cessation.     COVID-19 Education: The signs and symptoms of COVID-19 were discussed with the patient and how to seek care for testing (follow up with PCP or arrange E-visit).  The importance of social distancing was discussed today.  Time:   Today, I have spent 8 minutes with the patient with telehealth technology discussing the above problems.     Medication Adjustments/Labs and Tests Ordered: Current medicines are reviewed at length with the patient today.   Concerns regarding medicines are outlined above.   Tests Ordered: No orders of the defined types were placed in this encounter.   Medication Changes: No orders of the defined types were placed in this encounter.   Follow Up:  In Person in 6 month(s)  Signed, Lorine Bears, MD  07/08/2019 3:42 PM    [facility]

## 2019-07-09 ENCOUNTER — Other Ambulatory Visit: Payer: Self-pay

## 2019-07-09 ENCOUNTER — Other Ambulatory Visit (INDEPENDENT_AMBULATORY_CARE_PROVIDER_SITE_OTHER): Payer: Medicare Other

## 2019-07-09 DIAGNOSIS — I10 Essential (primary) hypertension: Secondary | ICD-10-CM | POA: Diagnosis not present

## 2019-07-09 LAB — LDL CHOLESTEROL, DIRECT: Direct LDL: 108 mg/dL

## 2019-07-09 LAB — COMPREHENSIVE METABOLIC PANEL
ALT: 16 U/L (ref 0–35)
AST: 20 U/L (ref 0–37)
Albumin: 4.2 g/dL (ref 3.5–5.2)
Alkaline Phosphatase: 83 U/L (ref 39–117)
BUN: 9 mg/dL (ref 6–23)
CO2: 33 mEq/L — ABNORMAL HIGH (ref 19–32)
Calcium: 8.7 mg/dL (ref 8.4–10.5)
Chloride: 103 mEq/L (ref 96–112)
Creatinine, Ser: 0.61 mg/dL (ref 0.40–1.20)
GFR: 100.1 mL/min (ref >60.00)
Glucose, Bld: 87 mg/dL (ref 70–99)
Potassium: 3.6 mEq/L (ref 3.5–5.1)
Sodium: 142 mEq/L (ref 135–145)
Total Bilirubin: 0.2 mg/dL (ref 0.2–1.2)
Total Protein: 6.3 g/dL (ref 6.0–8.3)

## 2019-07-09 LAB — LIPID PANEL
Cholesterol: 203 mg/dL — ABNORMAL HIGH (ref 0–200)
HDL: 46 mg/dL (ref >39.00)
NonHDL: 157.28
Total CHOL/HDL Ratio: 4
Triglycerides: 285 mg/dL — ABNORMAL HIGH (ref 0.0–149.0)
VLDL: 57 mg/dL — ABNORMAL HIGH (ref 0.0–40.0)

## 2019-07-09 LAB — HEMOGLOBIN A1C: Hgb A1c MFr Bld: 5 % (ref 4.6–6.5)

## 2019-07-09 LAB — CBC WITH DIFFERENTIAL/PLATELET
Basophils Absolute: 0 10*3/uL (ref 0.0–0.1)
Basophils Relative: 0.6 % (ref 0.0–3.0)
Eosinophils Absolute: 0.1 10*3/uL (ref 0.0–0.7)
Eosinophils Relative: 1.6 % (ref 0.0–5.0)
HCT: 40.5 % (ref 36.0–46.0)
Hemoglobin: 13.9 g/dL (ref 12.0–15.0)
Lymphocytes Relative: 9 % — ABNORMAL LOW (ref 12.0–46.0)
Lymphs Abs: 0.4 10*3/uL — ABNORMAL LOW (ref 0.7–4.0)
MCHC: 34.4 g/dL (ref 30.0–36.0)
MCV: 94.5 fl (ref 78.0–100.0)
Monocytes Absolute: 0.4 10*3/uL (ref 0.1–1.0)
Monocytes Relative: 10.7 % (ref 3.0–12.0)
Neutro Abs: 3.1 10*3/uL (ref 1.4–7.7)
Neutrophils Relative %: 78.1 % — ABNORMAL HIGH (ref 43.0–77.0)
Platelets: 160 10*3/uL (ref 150.0–400.0)
RBC: 4.29 Mil/uL (ref 3.87–5.11)
RDW: 12.6 % (ref 11.5–15.5)
WBC: 3.9 10*3/uL — ABNORMAL LOW (ref 4.0–10.5)

## 2019-07-09 LAB — TSH: TSH: 5.4 u[IU]/mL — ABNORMAL HIGH (ref 0.35–4.50)

## 2019-07-09 LAB — VITAMIN D 25 HYDROXY (VIT D DEFICIENCY, FRACTURES): VITD: 37.26 ng/mL (ref 30.00–100.00)

## 2019-07-10 ENCOUNTER — Ambulatory Visit
Admission: RE | Admit: 2019-07-10 | Discharge: 2019-07-10 | Disposition: A | Discharge status: Home / Self Care | Payer: Medicare Other | Source: Ambulatory Visit | Attending: Oncology | Admitting: Oncology

## 2019-07-10 DIAGNOSIS — Z122 Encounter for screening for malignant neoplasm of respiratory organs: Secondary | ICD-10-CM | POA: Insufficient documentation

## 2019-07-10 DIAGNOSIS — Z87891 Personal history of nicotine dependence: Secondary | ICD-10-CM | POA: Insufficient documentation

## 2019-07-14 ENCOUNTER — Other Ambulatory Visit: Payer: Self-pay | Admitting: Family

## 2019-07-14 DIAGNOSIS — R899 Unspecified abnormal finding in specimens from other organs, systems and tissues: Secondary | ICD-10-CM

## 2019-07-15 ENCOUNTER — Other Ambulatory Visit: Payer: Self-pay

## 2019-07-15 ENCOUNTER — Telehealth: Payer: Self-pay

## 2019-07-15 ENCOUNTER — Encounter: Payer: Self-pay | Admitting: *Deleted

## 2019-07-15 DIAGNOSIS — R899 Unspecified abnormal finding in specimens from other organs, systems and tissues: Secondary | ICD-10-CM

## 2019-07-15 MED ORDER — ROSUVASTATIN CALCIUM 20 MG PO TABS: 20.0000 mg | ORAL_TABLET | Freq: Every day | ORAL | 3 refills | Status: DC

## 2019-07-15 NOTE — Telephone Encounter (Signed)
I have called patient & reviewed results.

## 2019-07-15 NOTE — Progress Notes (Unsigned)
comprehe

## 2019-07-15 NOTE — Telephone Encounter (Signed)
Copied from [place] [id number]. Topic: General - Call Back - No Documentation >> Jul 15, 2019 10:51 AM Sheran Luz wrote: Patient returning call to Sarah. Unable to reach office x4.

## 2019-07-17 MED FILL — TECFIDERA 240 MG CAPSULE,DELAYED RELEASE: 30 days supply | Qty: 60 | Fill #0 | Status: AC

## 2019-07-17 NOTE — Unmapped (Signed)
[facility] Refill Coordination Note    Specialty Medication(s) to be Shipped:   Neurology: Tecfidera    Other medication(s) to be shipped: none     Jennifer Lewis, DOB: 09-Jun-1961  Phone: [phone] (home)       All above HIPAA information was verified with patient.     Completed refill call assessment today to schedule patient's medication shipment from the [facility] [phone]).       Specialty medication(s) and dose(s) confirmed: Regimen is correct and unchanged.   Changes to medications: increased choleesterol medication to 20mg  and stopped Spiriva  Changes to insurance: No  Questions for the pharmacist: No    Confirmed patient received Welcome Packet with first shipment. The patient will receive a drug information handout for each medication shipped and additional FDA Medication Guides as required.       DISEASE/MEDICATION-SPECIFIC INFORMATION        N/A    SPECIALTY MEDICATION ADHERENCE     Medication Adherence    Specialty Medication: Tecfidera  Patient is on additional specialty medications: No  Patient is on more than two specialty medications: No  Adherence tools used: patient uses a pill box to manage medications   Other adherence tool: leaves medicine on dining table to take with meals           Tecfidera 240 mg: 3 days of medicine on hand       SHIPPING     Shipping address confirmed in Epic.     Delivery Scheduled: Yes, Expected medication delivery date: 07/18/19.     Medication will be delivered via Next Day Courier to the home address in Epic [place].    Eulogia Dismore A Shari [facility] Specialty Pharmacist

## 2019-07-25 ENCOUNTER — Encounter: Admit: 2019-07-25 | Discharge: 2019-07-25 | Payer: MEDICARE

## 2019-07-28 ENCOUNTER — Telehealth: Payer: Self-pay | Admitting: Family

## 2019-07-28 NOTE — Telephone Encounter (Signed)
I spoke with patient & advised on below. She is also doing well on new increased dose of Crestor w/o side effects.

## 2019-07-28 NOTE — Telephone Encounter (Signed)
Call pt  1) how are you doing on higher dose of crestor?   2)    I received results from your annual CT lung scan from [facility] which showed benign findings of lungs. You will need do this again next year so please ensure you are contacted in regards to this and certainly call our office if you are not contacted for another test.   It was noted again on the exam that you have coronary artery atherosclerosis, or often referred to as hardening of the arteries.   This can put you at risk for stroke, heart attack.   Please ensure you continue to follow with [place] for cholesterol labs and that you continue to follow with cardiology/ Dr Fletcher Anon

## 2019-08-12 NOTE — Unmapped (Signed)
Patient has a full unopened bottle of Tecfidera on hand. Rescheduling call 3 weeks from now.

## 2019-08-14 ENCOUNTER — Other Ambulatory Visit: Payer: Medicare Other

## 2019-08-15 ENCOUNTER — Encounter: Payer: Self-pay | Admitting: Family

## 2019-08-18 ENCOUNTER — Telehealth: Payer: Self-pay | Admitting: Family

## 2019-08-18 NOTE — Telephone Encounter (Signed)
Call pt  Her swelling is significant and im sure uncomfortable.   Lets bring her in tomorrow with Lauren or Weds with me. We can do labs then too.

## 2019-08-18 NOTE — Telephone Encounter (Signed)
Patient scheduled for Wednesday in office appointment. She can have labs done when she is here.

## 2019-08-20 ENCOUNTER — Other Ambulatory Visit: Payer: Self-pay

## 2019-08-20 ENCOUNTER — Other Ambulatory Visit: Payer: Medicare Other

## 2019-08-20 ENCOUNTER — Ambulatory Visit (INDEPENDENT_AMBULATORY_CARE_PROVIDER_SITE_OTHER): Payer: Medicare Other | Admitting: Family

## 2019-08-20 ENCOUNTER — Encounter: Payer: Self-pay | Admitting: Family

## 2019-08-20 VITALS — BP 118/80 | HR 86 | Temp 97.5°F | Wt 167.0 lb

## 2019-08-20 DIAGNOSIS — I10 Essential (primary) hypertension: Secondary | ICD-10-CM

## 2019-08-20 DIAGNOSIS — M7989 Other specified soft tissue disorders: Secondary | ICD-10-CM | POA: Diagnosis not present

## 2019-08-20 DIAGNOSIS — G4700 Insomnia, unspecified: Secondary | ICD-10-CM | POA: Diagnosis not present

## 2019-08-20 LAB — COMPREHENSIVE METABOLIC PANEL
ALT: 18 U/L (ref 0–35)
AST: 24 U/L (ref 0–37)
Albumin: 4.3 g/dL (ref 3.5–5.2)
Alkaline Phosphatase: 85 U/L (ref 39–117)
BUN: 10 mg/dL (ref 6–23)
CO2: 33 mEq/L — ABNORMAL HIGH (ref 19–32)
Calcium: 9.4 mg/dL (ref 8.4–10.5)
Chloride: 101 mEq/L (ref 96–112)
Creatinine, Ser: 0.71 mg/dL (ref 0.40–1.20)
GFR: 83.98 mL/min (ref >60.00)
Glucose, Bld: 92 mg/dL (ref 70–99)
Potassium: 4 mEq/L (ref 3.5–5.1)
Sodium: 141 mEq/L (ref 135–145)
Total Bilirubin: 0.3 mg/dL (ref 0.2–1.2)
Total Protein: 6.7 g/dL (ref 6.0–8.3)

## 2019-08-20 MED ORDER — TRAZODONE HCL 50 MG PO TABS: 25.0000 mg | ORAL_TABLET | Freq: Every evening | ORAL | 3 refills | Status: DC | PRN

## 2019-08-20 NOTE — Patient Instructions (Signed)
Lets trial trazodone for sleep.  Compression stockings- see prescription Please make a one month follow up appointment with one of my colleagues to ensure insomnia and also leg swelling improved  Stay safe!    Peripheral Edema  Peripheral edema is swelling that is caused by a buildup of fluid. Peripheral edema most often affects the lower legs, ankles, and feet. It can also develop in the arms, hands, and face. The area of the body that has peripheral edema will look swollen. It Doenges also feel heavy or warm. Your clothes Mancuso start to feel tight. Pressing on the area Hutto make a temporary dent in your skin. You Tinkle not be able to move your swollen arm or leg as much as usual. There are many causes of peripheral edema. It can happen because of a complication of other conditions such as congestive heart failure, kidney disease, or a problem with your blood circulation. It also can be a side effect of certain medicines or because of an infection. It often happens to women during pregnancy. Sometimes, the cause is not known. Follow these instructions at home: Managing pain, stiffness, and swelling   Raise (elevate) your legs while you are sitting or lying down.  Move around often to prevent stiffness and to lessen swelling.  Do not sit or stand for long periods of time.  Wear support stockings as told by your health care provider. Medicines  Take over-the-counter and prescription medicines only as told by your health care provider.  Your health care provider Sellman prescribe medicine to help your body get rid of excess water (diuretic). General instructions  Pay attention to any changes in your symptoms.  Follow instructions from your health care provider about limiting salt (sodium) in your diet. Sometimes, eating less salt Grills reduce swelling.  Moisturize skin daily to help prevent skin from cracking and draining.  Keep all follow-up visits as told by your health care provider. This is  important. Contact a health care provider if you have:  A fever.  Edema that starts suddenly or is getting worse, especially if you are pregnant or have a medical condition.  Swelling in only one leg.  Increased swelling, redness, or pain in one or both of your legs.  Drainage or sores at the area where you have edema. Get help right away if you:  Develop shortness of breath, especially when you are lying down.  Have pain in your chest or abdomen.  Feel weak.  Feel faint. Summary  Peripheral edema is swelling that is caused by a buildup of fluid. Peripheral edema most often affects the lower legs, ankles, and feet.  Move around often to prevent stiffness and to lessen swelling. Do not sit or stand for long periods of time.  Pay attention to any changes in your symptoms.  Contact a health care provider if you have edema that starts suddenly or is getting worse, especially if you are pregnant or have a medical condition.  Get help right away if you develop shortness of breath, especially when lying down. This information is not intended to replace advice given to you by your health care provider. Make sure you discuss any questions you have with your health care provider. Document Released: 01/18/2005 Document Revised: 09/04/2018 Document Reviewed: 09/04/2018 Elsevier Patient Education  2020 [employer].

## 2019-08-20 NOTE — Progress Notes (Signed)
Subjective:    Patient ID: Tedra SenegalDianne D Knies, female    DOB: February 13, 1961, 58 y.o.   MRN: [id number]  CC: Tedra SenegalDianne D Kemmer is a 58 y.o. female who presents today for an acute visit.    HPI: Chief complaint of bilateral swelling most noticeable around ankles. Resolved today.   Has  Been occurring  for years , comes almost every day, however here today as over the past month she feels like it is worsening altogether.   It Menzie come and go between which leg but more often the left ankle appears to be affected.  She denies any redness, fever, calf tenderness, asymmetry of the calf.  She is no history of DVT nor has her activity level changed of late.  No recent surgery.  She does feel like it is worse at the end of the day after long periods of standing. Some mornings no swelling at all.   She has had compression stockings in the past although has not been wearing them of late.  She does not add salt to her foods. No history of heart failure.    States that it is hard to elevate her legs is it does make her feel more short of breath in the setting of her COPD. She is compliant with lisinopril; denies any chest pain.    States her breathing is stable and she is had no weight changes.    She is doing well on Lexapro.  She does complain that she still has trouble falling and staying asleep.  She Politano wake up 2 or 3 times a night.  She has been on Ambien in the past.    declines doing mammograms in the future.  She will let me know changes her mind.    [facility]      Dr Kirke CorinArida 06/2019: atypical chest pain.  Recommends repeat echo  Echo 11/2018; normal EF. Moderate pulmonary HTN [facility] HISTORY:  Past Medical History:  Diagnosis Date  . Arthritis   . COPD (chronic obstructive pulmonary disease) (HCC)   . Depression   . Fibromyalgia   . Irritable bowel syndrome   . MS (multiple sclerosis) (HCC)    Followed by Dr.  Sherryll BurgerShah   Past Surgical History:  Procedure Laterality Date  . ABDOMINAL HYSTERECTOMY     CERVIX intact  . CHOLECYSTECTOMY N/A 01/03/2016   Procedure: LAPAROSCOPIC CHOLECYSTECTOMY WITH INTRAOPERATIVE CHOLANGIOGRAM;  Surgeon: Lattie Hawichard E Cooper, MD;  Location: [facility] ORS;  Service: General;  Laterality: N/A;  . ENDOSCOPIC RETROGRADE CHOLANGIOPANCREATOGRAPHY (ERCP) WITH PROPOFOL N/A 01/04/2016   Procedure: ENDOSCOPIC RETROGRADE CHOLANGIOPANCREATOGRAPHY (ERCP) WITH PROPOFOL;  Surgeon: Midge Miniumarren Wohl, MD;  Location: [facility] ENDOSCOPY;  Service: Endoscopy;  Laterality: N/A;  . FOOT SURGERY  2001  . LAPAROSCOPIC ENDOMETRIOSIS FULGURATION     Family History  Problem Relation Age of Onset  . Hypertension Mother   . Hyperlipidemia Mother   . Arthritis Mother   . Cancer Father        throat  . Heart disease Brother   . Bipolar disorder Son   . Schizophrenia Son   . Cancer Maternal Grandfather        liver  . Cancer Paternal Grandmother        liver    Allergies: Valium [diazepam] Current Outpatient Medications on File Prior to Visit  Medication Sig Dispense Refill  . acyclovir (ZOVIRAX) 400 MG tablet Take 1 tablet (400  mg total) by mouth daily. 90 tablet 3  . Albuterol Sulfate (PROAIR RESPICLICK) 108 (90 Base) MCG/ACT AEPB Inhale 90 mcg into the lungs every 6 (six) hours as needed (as needed for cough, wheezing). (Patient taking differently: Inhale 180 mcg into the lungs every 6 (six) hours as needed (as needed for cough, wheezing). ) 1 each 2  . aspirin 81 MG tablet Take 81 mg by mouth daily.    . baclofen (LIORESAL) 10 MG tablet Take 1 tablet (10 mg total) by mouth 3 (three) times daily as needed for muscle spasms. 30 each 4  . carbamazepine (TEGRETOL) 200 MG tablet Take 200 mg by mouth 2 (two) times daily.    . cholecalciferol (VITAMIN D) 1000 units tablet Take 1,000 Units by mouth daily.    . diclofenac sodium (VOLTAREN) 1 % GEL Apply 4 g topically 4 (four) times daily. 1 Tube 3  . Dimethyl Fumarate  240 MG CPDR Take 240 mg by mouth 2 (two) times daily.    Marland Kitchen escitalopram (LEXAPRO) 20 MG tablet Take 1 tablet (20 mg total) by mouth daily. 90 tablet 1  . Fluticasone-Salmeterol (ADVAIR DISKUS) 250-50 MCG/DOSE AEPB Inhale 1 puff into the lungs 2 (two) times daily. 1 each 3  . lisinopril (PRINIVIL,ZESTRIL) 10 MG tablet Take 1 tablet (10 mg total) by mouth daily. 90 tablet 4  . Multiple Vitamins-Minerals (CENTRUM SILVER ULTRA WOMENS) TABS Take 1 tablet by mouth daily.    . ondansetron (ZOFRAN-ODT) 4 MG disintegrating tablet Take 1 tablet (4 mg total) by mouth every 6 (six) hours as needed for nausea or vomiting. 20 tablet 1  . rosuvastatin (CRESTOR) 20 MG tablet Take 1 tablet (20 mg total) by mouth daily. 90 tablet 3  . Fluticasone-Salmeterol (ADVAIR DISKUS) 100-50 MCG/DOSE AEPB Inhale 1 puff into the lungs 2 (two) times daily as needed (for shortness of breath). 60 each 3   No current facility-administered medications on file prior to visit.     Social History   Tobacco Use  . Smoking status: Current Every Day Smoker    Packs/day: 1.00    Years: 43.00    Pack years: 43.00  . Smokeless tobacco: Never Used  Substance Use Topics  . Alcohol use: No    Alcohol/week: 0.0 standard drinks  . Drug use: No    Review of Systems  Constitutional: Negative for chills and fever.  Respiratory: Positive for shortness of breath (at baseline). Negative for cough.   Cardiovascular: Positive for leg swelling. Negative for chest pain and palpitations.  Gastrointestinal: Negative for nausea and vomiting.  Psychiatric/Behavioral: Positive for sleep disturbance.      Objective:    BP 118/80   Pulse 86   Temp (!) 97.5 F (36.4 C)   Wt 167 lb (75.8 kg)   SpO2 97%   BMI 26.95 kg/m    Physical Exam Vitals signs reviewed.  Constitutional:      Appearance: She is well-developed.  Eyes:     Conjunctiva/sclera: Conjunctivae normal.  Cardiovascular:     Rate and Rhythm: Normal rate and regular  rhythm.     Pulses: Normal pulses.     Heart sounds: Normal heart sounds.     Comments: No LE edema, palpable cords or masses. No erythema or increased warmth. No asymmetry in calf size when compared bilaterally LE hair growth symmetric and present. No discoloration of varicosities noted. LE warm and palpable pedal pulses.  Pulmonary:     Effort: Pulmonary effort is normal.  Breath sounds: Normal breath sounds. No wheezing, rhonchi or rales.  Skin:    General: Skin is warm and dry.  Neurological:     Mental Status: She is alert.     Comments: Sensation intact BL feet with microfilament.   Psychiatric:        Speech: Speech normal.        Behavior: Behavior normal.        Thought Content: Thought content normal.        Assessment & Plan:   Problem List Items Addressed This Visit      Cardiovascular and Mediastinum   HTN (hypertension)    At goal; continue regimen      Relevant Orders   Comprehensive metabolic panel     Other   Leg swelling    Resolved today.  Wells criteria for DVT is low/0.  Patient felt most comfortable trying compression stockings and I have given a prescription started 17mmHg.  I offered Lasix to use as needed however she politely declines at this time.  She will try conservative management at home.      Insomnia - Primary    Chronic. Continue lexapro; start low dose trazodone. Of note , She had been on ambien in the past and done well. F/u one month.      Relevant Medications   traZODone (DESYREL) 50 MG tablet        I am having Edan D. Perezgarcia start on traZODone. I am also having her maintain her Albuterol Sulfate, Fluticasone-Salmeterol, Dimethyl Fumarate, Centrum Silver Ultra Womens, carbamazepine, cholecalciferol, ondansetron, diclofenac sodium, aspirin, lisinopril, baclofen, acyclovir, escitalopram, Fluticasone-Salmeterol, and rosuvastatin.   Meds ordered this encounter  Medications  . traZODone (DESYREL) 50 MG tablet    Sig: Take  0.5-1 tablets (25-50 mg total) by mouth at bedtime as needed for sleep.    Dispense:  30 tablet    Refill:  3    Order Specific Question:   Supervising Provider    Answer:   Crecencio Mc [[id number]]    Return precautions given.   Risks, benefits, and alternatives of the medications and treatment plan prescribed today were discussed, and patient expressed understanding.   Education regarding symptom management and diagnosis given to patient on AVS.  Continue to follow with Burnard Hawthorne, FNP for routine health maintenance.   Sibley D Senseney and I agreed with plan.   Mable Paris, FNP

## 2019-08-20 NOTE — Assessment & Plan Note (Signed)
At goal; continue regimen 

## 2019-08-20 NOTE — Assessment & Plan Note (Signed)
Chronic. Continue lexapro; start low dose trazodone. Of note , She had been on ambien in the past and done well. F/u one month.

## 2019-08-20 NOTE — Assessment & Plan Note (Addendum)
Resolved today.  Wells criteria for DVT is low/0.  Patient felt most comfortable trying compression stockings and I have given a prescription started 77mmHg.  I offered Lasix to use as needed however she politely declines at this time.  She will try conservative management at home.

## 2019-08-29 NOTE — Unmapped (Signed)
Made second and final attempt to provide counseling to [facility] D Mrozek, 57 y.o. for treatment of tobacco use/dependence. Pt had expressed interest in counseling via automated phone program. Pt will remain in automated call program to provide future opportunities for engagement with Tobacco Treatment Program.    Tobacco Use Treatment  Program: Hospital Inpatient  Type of Visit: Attempt  Tobacco Use Treatment Visit: Patient not available(LVM)    Tobacco Use During Past 30 Days  Type of Tobacco Products Used: Cigarettes    TTS Information  TTS Visit Length: Less than 3 minutes    Doristine Devoid  Tobacco Treatment Specialist  [facility] Tobacco [facility] Program  [phone]

## 2019-09-08 NOTE — Unmapped (Signed)
[facility] Clinical Assessment & Refill Coordination Note    Jennifer Lewis, DOB: Sep 28, 1961  Phone: [phone] (home)     All above HIPAA information was verified with patient.     Specialty Medication(s):   Neurology: Tecfidera     Current Outpatient Medications   Medication Sig Dispense Refill   ??? lifitegrast (XIIDRA) 5 % Dpet Apply 1 drop to eye Two (2) times a day.     ??? acyclovir (ZOVIRAX) 400 MG tablet Take 400 mg by mouth nightly.      ??? albuterol sulfate (PROAIR RESPICLICK) 90 mcg/actuation AePB Inhale 180 mcg Every four (4) hours.     ??? aspirin 81 MG chewable tablet Chew 1 tablet (81 mg total) daily. 30 tablet 0   ??? baclofen (LIORESAL) 10 MG tablet Take 1 tablet (10 mg total) by mouth Three (3) times a day as needed for muscle spasms. 30 tablet 5   ??? carBAMazepine (TEGRETOL) 200 mg tablet Take 1 tablet (200 mg total) by mouth Two (2) times a day. 60 tablet 2   ??? cholecalciferol, vitamin D3, (VITAMIN D3) 1,000 unit capsule Take 1,000 Units by mouth daily.     ??? dimethyl fumarate 240 mg CpDR Take 1 capsule (240mg ) by mouth twice daily with meals. 60 capsule 2   ??? escitalopram oxalate (LEXAPRO) 10 MG tablet Take 10 mg by mouth daily.      ??? fluticasone-salmeterol (ADVAIR) 100-50 mcg/dose diskus Inhale 1 puff Two (2) times a day.     ??? lisinopril (PRINIVIL,ZESTRIL) 10 MG tablet Take 1 tablet (10 mg total) by mouth daily. 30 tablet 0   ??? multivit,iron,minerals/lutein (CENTRUM SILVER ULTRA WOMEN'S ORAL) Take 1 tablet by mouth daily.     ??? ondansetron (ZOFRAN-ODT) 4 MG disintegrating tablet Take 4 mg by mouth every six (6) hours as needed for nausea.     ??? rosuvastatin (CRESTOR) 10 MG tablet Take 10 mg by mouth daily.       No current facility-administered medications for this visit.         Changes to medications: Lashone reports starting the following medications: xiidra    No Known Allergies    Changes to allergies: No    SPECIALTY MEDICATION ADHERENCE     Tecfidera 240 mg: ~4 days of medicine on hand       Medication Adherence    Patient reported X missed doses in the last month: 0  Specialty Medication: tecfidera  Patient is on additional specialty medications: No  Adherence tools used: patient uses a pill box to manage medications   Other adherence tool: leaves medicine on dining table to take with meals           Specialty medication(s) dose(s) confirmed: Regimen is correct and unchanged.     Are there any concerns with adherence? No    Adherence counseling provided? Not needed    CLINICAL MANAGEMENT AND INTERVENTION      Clinical Benefit Assessment:    Do you feel the medicine is effective or helping your condition? Yes    Clinical Benefit counseling provided? Not needed    Adverse Effects Assessment:    Are you experiencing any side effects? No    Are you experiencing difficulty administering your medicine? No    Quality of Life Assessment:    How many days over the past month did your MS  keep you from your normal activities? For example, brushing your teeth or getting up in the  morning. patient unable to quantify as she has many underlying disease states    Have you discussed this with your provider? Yes    Therapy Appropriateness:    Is therapy appropriate? Yes, therapy is appropriate and should be continued    DISEASE/MEDICATION-SPECIFIC INFORMATION      N/A    PATIENT SPECIFIC NEEDS     ? Does the patient have any physical, cognitive, or cultural barriers? No    ? Is the patient high risk? No     ? Does the patient require a Care Management Plan? No     ? Does the patient require physician intervention or other additional services (i.e. nutrition, smoking cessation, social work)? No      SHIPPING     Specialty Medication(s) to be Shipped:   Neurology: Tecfidera    Other medication(s) to be shipped: none     Changes to insurance: No    Delivery Scheduled: Yes, Expected medication delivery date: 09/10/19.     Medication will be delivered via Next Day Courier to the confirmed home address in [facility].    The patient will receive a drug information handout for each medication shipped and additional FDA Medication Guides as required.  Verified that patient has previously received a Conservation officer, historic buildings.    All of the patient's questions and concerns have been addressed.    Arnold Long   [facility] Specialty Pharmacist

## 2019-09-09 MED FILL — TECFIDERA 240 MG CAPSULE,DELAYED RELEASE: ORAL | 30 days supply | Qty: 60 | Fill #1

## 2019-09-09 MED FILL — TECFIDERA 240 MG CAPSULE,DELAYED RELEASE: 30 days supply | Qty: 60 | Fill #1 | Status: AC

## 2019-09-10 IMAGING — US US EXTREM UP *R* LTD
2 series · 14 of 17 positions shown · non-contrast
Comparison: None

CLINICAL DATA: Right wrist soft tissue mass, tender and greater
than last year.

EXAM:
ULTRASOUND RIGHT UPPER EXTREMITY (WRIST) LIMITED
TECHNIQUE: Ultrasound examination of the upper extremity soft tissues was
performed in the area of clinical concern.

[Series 1: us extrem up *right* ltd · 0.05mm/px · 10 of 12 slices shown (1 of 2)]
[im 1/12]
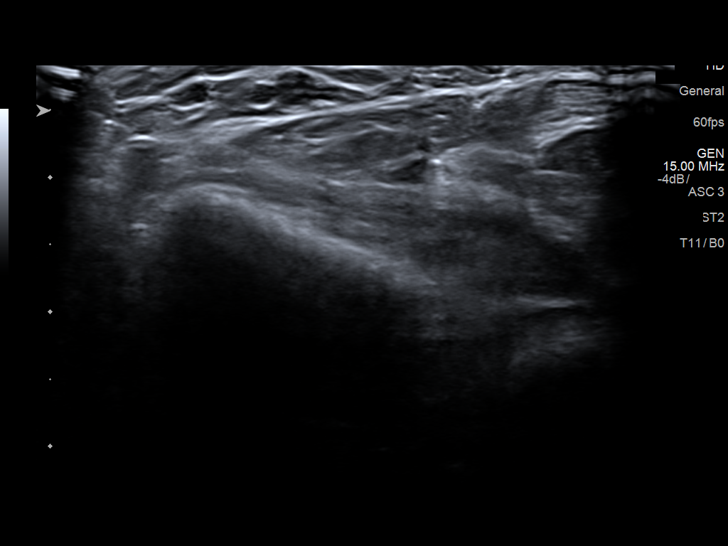
[im 2/12]
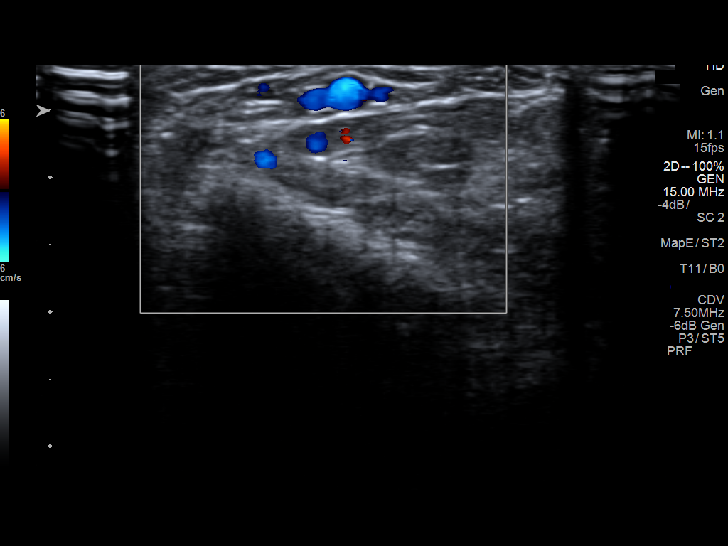
[im 4/12]
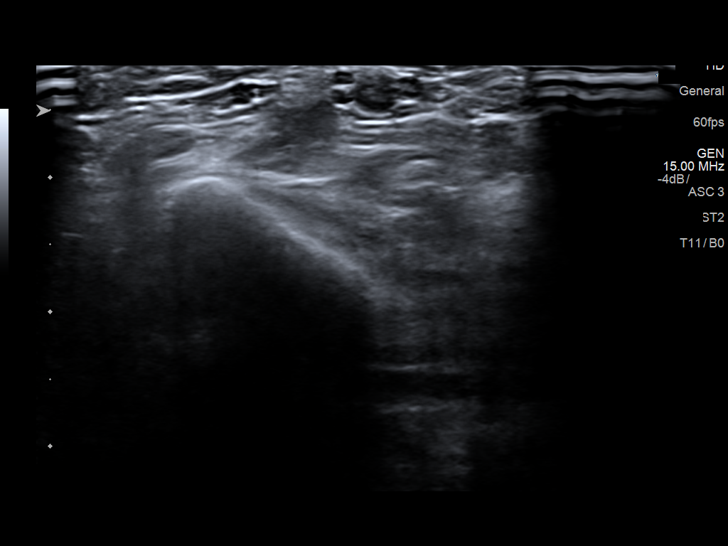
[im 5/12]
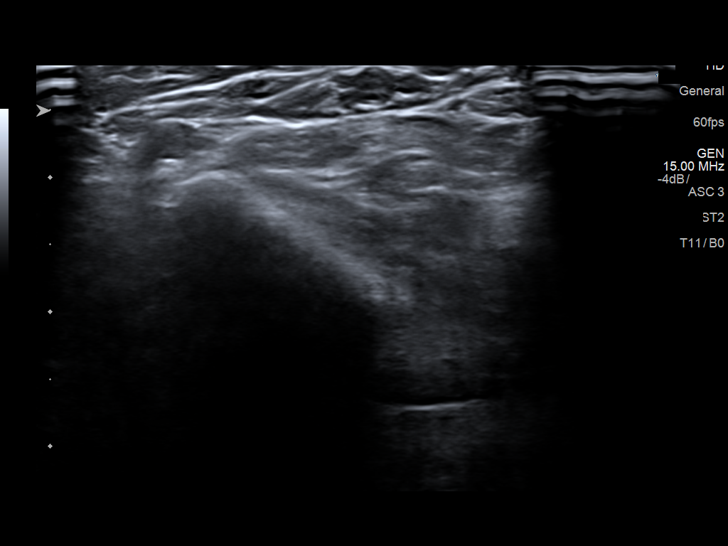
[im 6/12]
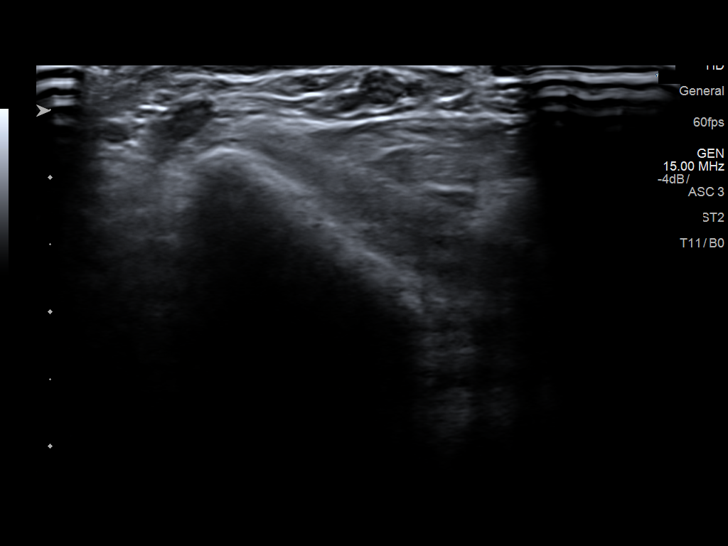
[im 7/12]
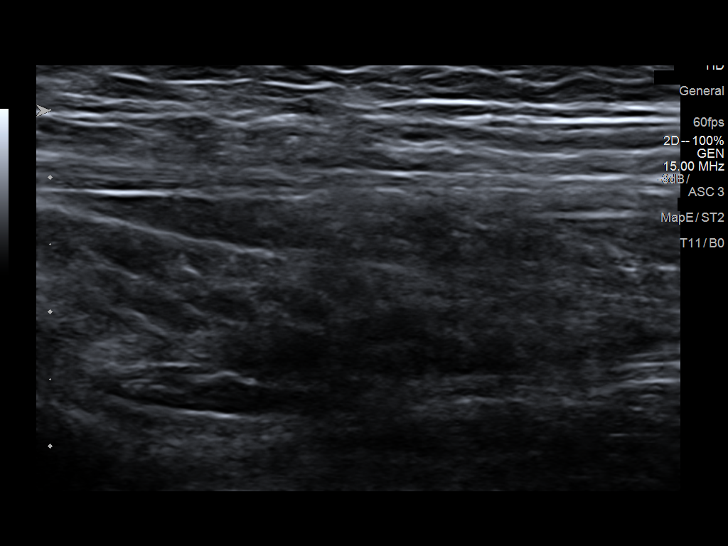
[im 8/12]
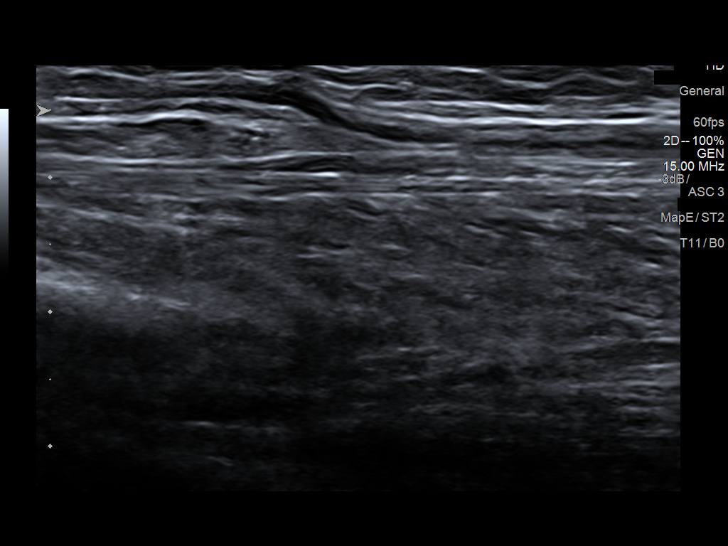
[im 10/12]
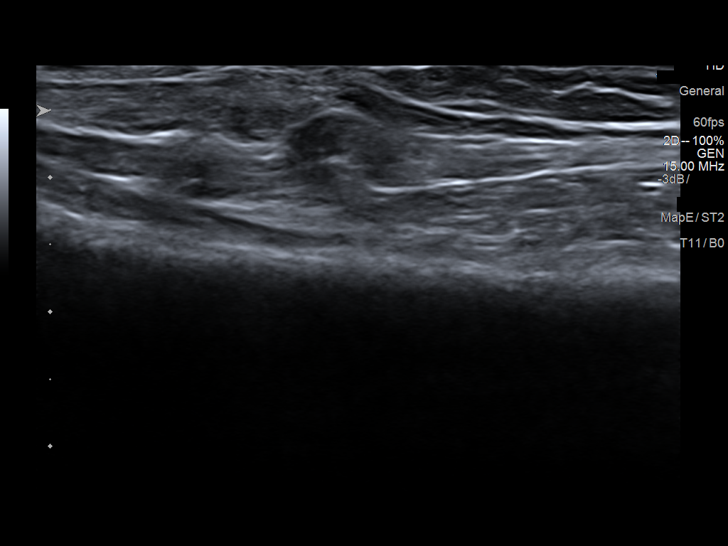
[im 11/12]
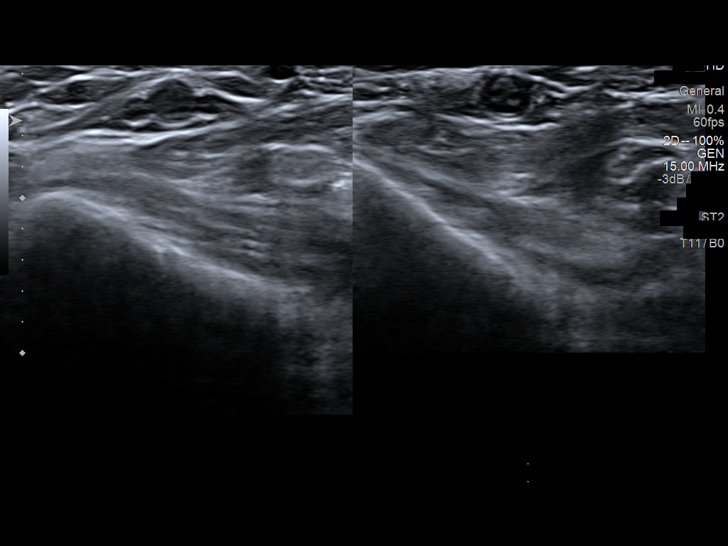
[im 12/12]
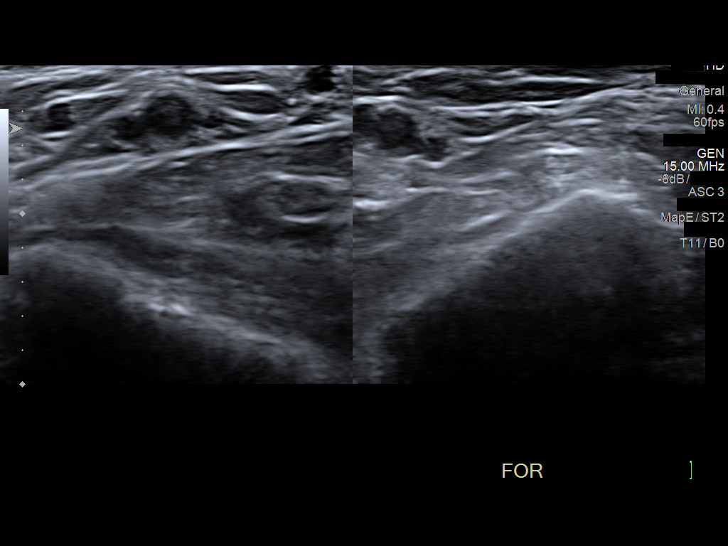

[Series 2: us extrem up *right* ltd · 5 acquisitions, 4 frames shown (2 of 2)]
[im 1/5]
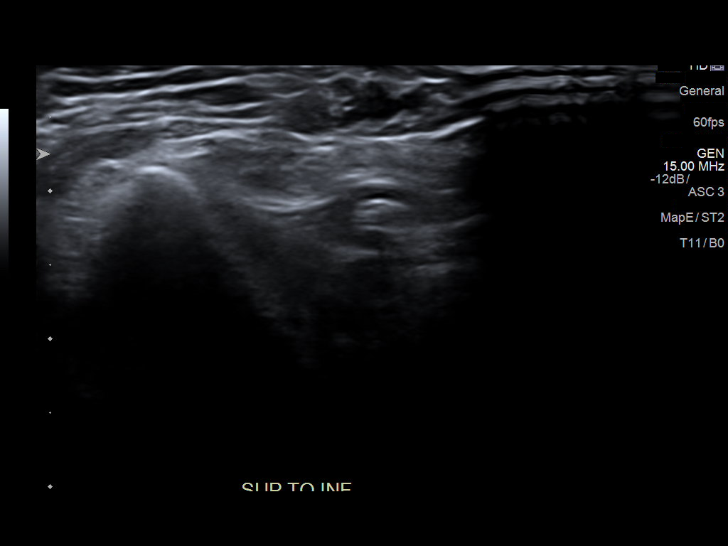
[im 2/5]
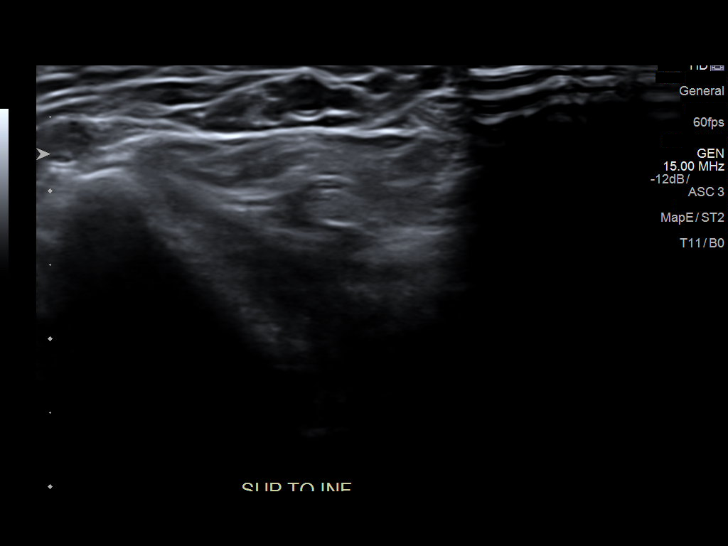
[im 4/5]
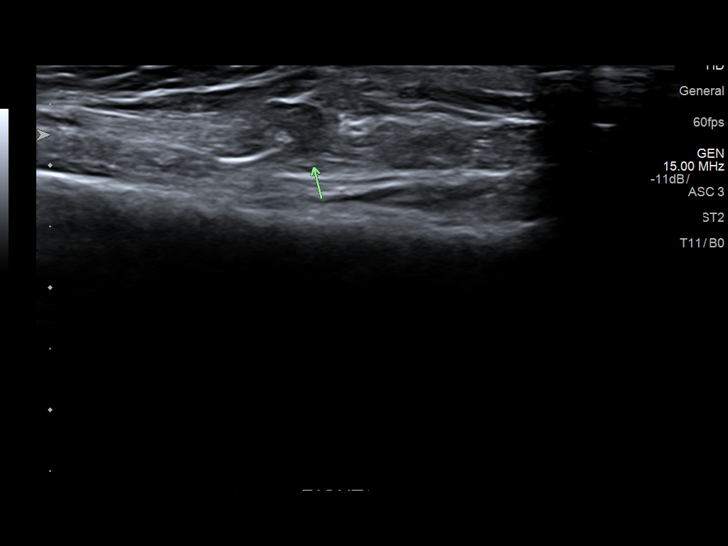
[im 5/5]
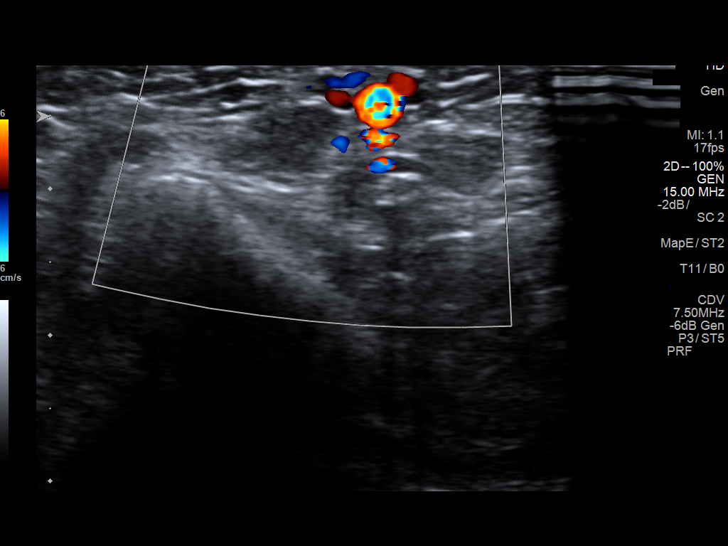

[14 of 17 positions shown; findings below may reference images not displayed]

FINDINGS: Imaging over the area of concern up along the right wrist was
performed. No discrete mass nor fluid collection is identified
though there is a vague area of hypoechoic soft tissue prominence
adjacent to the radial vessels. MRI may prove more useful without IV
contrast if clinically necessary.
IMPRESSION: No abnormal fluid collection or definite mass identified. A
hypoechoic structure near the radial vessels is identified, similar
in appearance to adjacent muscle but without discrete margins. MRI
with contrast may help for better assessment on a nonemergent basis
in the workup of soft tissue masses as deemed clinically necessary.

## 2019-09-19 ENCOUNTER — Ambulatory Visit: Payer: Medicare Other | Admitting: Family Medicine

## 2019-09-25 ENCOUNTER — Other Ambulatory Visit: Payer: Self-pay

## 2019-09-25 ENCOUNTER — Ambulatory Visit (INDEPENDENT_AMBULATORY_CARE_PROVIDER_SITE_OTHER): Payer: Medicare Other | Admitting: Cardiovascular Disease

## 2019-09-25 ENCOUNTER — Encounter: Payer: Self-pay | Admitting: Cardiovascular Disease

## 2019-09-25 ENCOUNTER — Telehealth: Payer: Self-pay | Admitting: Cardiovascular Disease

## 2019-09-25 VITALS — BP 128/80 | HR 96 | Temp 97.3°F | Ht 65.0 in | Wt 167.5 lb

## 2019-09-25 DIAGNOSIS — Z720 Tobacco use: Secondary | ICD-10-CM

## 2019-09-25 DIAGNOSIS — R079 Chest pain, unspecified: Secondary | ICD-10-CM | POA: Diagnosis not present

## 2019-09-25 DIAGNOSIS — Z01810 Encounter for preprocedural cardiovascular examination: Secondary | ICD-10-CM

## 2019-09-25 DIAGNOSIS — I272 Pulmonary hypertension, unspecified: Secondary | ICD-10-CM | POA: Diagnosis not present

## 2019-09-25 DIAGNOSIS — I10 Essential (primary) hypertension: Secondary | ICD-10-CM

## 2019-09-25 DIAGNOSIS — E785 Hyperlipidemia, unspecified: Secondary | ICD-10-CM

## 2019-09-25 NOTE — Telephone Encounter (Signed)
Patient states she will discuss fu with nurse when she calls back to set up cath

## 2019-09-25 NOTE — Telephone Encounter (Signed)
Patient calling to schedule cath .

## 2019-09-25 NOTE — Telephone Encounter (Signed)
Called patient back. She would like to have Right/Left heart cath on 09/29/19. Scheduled her for 10/5 at 10:30 am at [facility] by Dr Fletcher Anon. She is aware to arrive at 9:30 am for procedure. She has the other pre-procedural instructions from her AVS office visit today. She verbalized understanding to go to the [address] testing between 2-3:30 pm. Call placed to Covid testing and left message saying patient was coming.

## 2019-09-25 NOTE — H&P (View-Only) (Signed)
Cardiology Office Note   Date:  09/25/2019   ID:  Debra Hogan, DOB June 29, 1961, MRN [id number]  PCP:  Allegra Grana, FNP  Cardiologist:   Lorine Bears, MD   Chief Complaint  Patient presents with  . other    Pt. c/o LE edema and chest pain. Meds reviewed by the pt. verbally. Pt. c/o chest pain and LE edema that lasted for about two weeks.       History of Present Illness: Debra Hogan is a 58 y.o. female who is here today for a follow-up regarding chest pain, shortness of breath and  moderate pulmonary hypertension.   The patient has chronic medical conditions that include fibromyalgia, multiple sclerosis, tobacco use, COPD, degenerative disc disease with chronic back pain and hyperlipidemia.  She has family history of coronary artery disease and she is a smoker.  She is on home oxygen at night. She had CT scan of the lungs in [place] 2019 which shows small pericardial effusion.   She had Lexiscan Myoview last year which showed no evidence of ischemia with normal ejection fraction. Echocardiogram showed normal LV systolic function with grade 1 diastolic dysfunction, normal RV systolic function and moderately elevated pulmonary pressure with estimated systolic pressure of 49 mmHg.  It was felt that her pulmonary hypertension was likely due to underlying lung disease.  She is on 2 L/min of oxygen therapy.  She reports worsening chest pain.  She had 2 recent episodes of sharp chest discomfort lasting only a few seconds.  In addition, she has been having more episodes of substernal chest tightness with activities with worsening exertional dyspnea.  She also had recent worsening of bilateral leg edema. She continues to smoke 1 pack/day and reports inability to quit given that multiple family members smoke in the house.    Past Medical History:  Diagnosis Date  . Arthritis   . COPD (chronic obstructive pulmonary disease) (HCC)   . Depression   . Fibromyalgia   . Irritable bowel  syndrome   . MS (multiple sclerosis) (HCC)    Followed by Dr. Sherryll Burger    Past Surgical History:  Procedure Laterality Date  . ABDOMINAL HYSTERECTOMY     CERVIX intact  . CHOLECYSTECTOMY N/A 01/03/2016   Procedure: LAPAROSCOPIC CHOLECYSTECTOMY WITH INTRAOPERATIVE CHOLANGIOGRAM;  Surgeon: Lattie Haw, MD;  Location: [facility] ORS;  Service: General;  Laterality: N/A;  . ENDOSCOPIC RETROGRADE CHOLANGIOPANCREATOGRAPHY (ERCP) WITH PROPOFOL N/A 01/04/2016   Procedure: ENDOSCOPIC RETROGRADE CHOLANGIOPANCREATOGRAPHY (ERCP) WITH PROPOFOL;  Surgeon: Midge Minium, MD;  Location: [facility] ENDOSCOPY;  Service: Endoscopy;  Laterality: N/A;  . FOOT SURGERY  2001  . LAPAROSCOPIC ENDOMETRIOSIS FULGURATION       Current Outpatient Medications  Medication Sig Dispense Refill  . acyclovir (ZOVIRAX) 400 MG tablet Take 1 tablet (400 mg total) by mouth daily. 90 tablet 3  . Albuterol Sulfate (PROAIR RESPICLICK) 108 (90 Base) MCG/ACT AEPB Inhale 90 mcg into the lungs every 6 (six) hours as needed (as needed for cough, wheezing). (Patient taking differently: Inhale 180 mcg into the lungs every 6 (six) hours as needed (as needed for cough, wheezing). ) 1 each 2  . aspirin 81 MG tablet Take 81 mg by mouth daily.    . baclofen (LIORESAL) 10 MG tablet Take 1 tablet (10 mg total) by mouth 3 (three) times daily as needed for muscle spasms. 30 each 4  . carbamazepine (TEGRETOL) 200 MG tablet Take 200 mg by mouth 2 (two) times daily.    Marland Kitchen  cholecalciferol (VITAMIN D) 1000 units tablet Take 1,000 Units by mouth daily.    . diclofenac sodium (VOLTAREN) 1 % GEL Apply 4 g topically 4 (four) times daily. 1 Tube 3  . Dimethyl Fumarate 240 MG CPDR Take 240 mg by mouth 2 (two) times daily.    Marland Kitchen escitalopram (LEXAPRO) 20 MG tablet Take 1 tablet (20 mg total) by mouth daily. 90 tablet 1  . Fluticasone-Salmeterol (ADVAIR DISKUS) 250-50 MCG/DOSE AEPB Inhale 1 puff into the lungs 2 (two) times daily. 1 each 3  . lisinopril (PRINIVIL,ZESTRIL)  10 MG tablet Take 1 tablet (10 mg total) by mouth daily. 90 tablet 4  . Multiple Vitamins-Minerals (CENTRUM SILVER ULTRA WOMENS) TABS Take 1 tablet by mouth daily.    . ondansetron (ZOFRAN-ODT) 4 MG disintegrating tablet Take 1 tablet (4 mg total) by mouth every 6 (six) hours as needed for nausea or vomiting. 20 tablet 1  . rosuvastatin (CRESTOR) 20 MG tablet Take 1 tablet (20 mg total) by mouth daily. 90 tablet 3  . traZODone (DESYREL) 50 MG tablet Take 0.5-1 tablets (25-50 mg total) by mouth at bedtime as needed for sleep. 30 tablet 3  . Fluticasone-Salmeterol (ADVAIR DISKUS) 100-50 MCG/DOSE AEPB Inhale 1 puff into the lungs 2 (two) times daily as needed (for shortness of breath). 60 each 3   No current facility-administered medications for this visit.     Allergies:   Valium [diazepam]    Social History:  The patient  reports that she has been smoking. She has a 43.00 pack-year smoking history. She has never used smokeless tobacco. She reports that she does not drink alcohol or use drugs.   Family History:  The patient's family history includes Arthritis in her mother; Bipolar disorder in her son; Cancer in her father, maternal grandfather, and paternal grandmother; Heart disease in her brother; Hyperlipidemia in her mother; Hypertension in her mother; Schizophrenia in her son.    ROS:  Please see the history of present illness.   Otherwise, review of systems are positive for none.   All other systems are reviewed and negative.    PHYSICAL EXAM: VS:  BP 128/80 (BP Location: Left Arm, Patient Position: Sitting, Cuff Size: Normal)   Pulse 96   Temp (!) 97.3 F (36.3 C)   Ht 5\' 5"  (1.651 m)   Wt 167 lb 8 oz (76 kg)   SpO2 99%   BMI 27.87 kg/m  , BMI Body mass index is 27.87 kg/m. GEN: Well nourished, well developed, in no acute distress  HEENT: normal  Neck: no JVD, carotid bruits, or masses Cardiac: RRR; no murmurs, rubs, or gallops, mild bilateral leg edema Respiratory: Mildly  diminished breath sounds bilaterally GI: soft, nontender, nondistended, + BS MS: no deformity or atrophy  Skin: warm and dry, no rash Neuro:  Strength and sensation are intact Psych: euthymic mood, full affect Radial pulses normal bilaterally.  EKG:  EKG is ordered today. The ekg ordered today demonstrates normal sinus rhythm with no significant ST or T wave changes.   Recent Labs: 07/09/2019: Hemoglobin 13.9; Platelets 160.0; TSH 5.40 08/20/2019: ALT 18; BUN 10; Creatinine, Ser 0.71; Potassium 4.0; Sodium 141    Lipid Panel    Component Value Date/Time   CHOL 203 (H) 07/09/2019 1405   TRIG 285.0 (H) 07/09/2019 1405   HDL 46.00 07/09/2019 1405   CHOLHDL 4 07/09/2019 1405   VLDL 57.0 (H) 07/09/2019 1405   LDLCALC 136 (H) 09/19/2013 1640   LDLDIRECT 108.0 07/09/2019 1405  Wt Readings from Last 3 Encounters:  09/25/19 167 lb 8 oz (76 kg)  08/20/19 167 lb (75.8 kg)  07/10/19 158 lb (71.7 kg)       PAD Screen 11/28/2018  Previous PAD dx? Yes  Previous surgical procedure? No  Pain with walking? Yes  Subsides with rest? No  Feet/toe relief with dangling? Yes  Painful, non-healing ulcers? No  Extremities discolored? No      ASSESSMENT AND PLAN:   1.  Chest pain and exertional dyspnea: Symptoms are worrisome for class III angina with recent worsening.  Given recent worsening, I recommend proceeding with right and left cardiac catheterization possible PCI.  I discussed the procedure in details as well as risks and benefits.  2.  Moderate pulmonary hypertension: This is likely related to underlying lung disease although other etiologies cannot be completely excluded.  Given recent worsening of shortness of breath and leg edema, I recommend doing a right heart catheterization as outlined above.  3.  Tobacco use: I discussed the importance of smoking cessation.  4.  Essential hypertension: Blood pressures controlled on lisinopril.  5.  Hyperlipidemia: Currently on  rosuvastatin 20 mg daily.  Recent lipid profile showed an LDL of 108.  If cardiac cath shows significant coronary artery disease, we will need to be more aggressive with this.   Disposition:   FU with me in 1 months.  Signed,  Kathlyn Sacramento, MD  09/25/2019 10:43 AM    [place]

## 2019-09-25 NOTE — Progress Notes (Signed)
Cardiology Office Note   Date:  09/25/2019   ID:  Debra Hogan, DOB June 29, 1961, MRN [id number]  PCP:  Debra Grana, FNP  Cardiologist:   Debra Bears, MD   Chief Complaint  Patient presents with  . other    Pt. c/o LE edema and chest pain. Meds reviewed by the pt. verbally. Pt. c/o chest pain and LE edema that lasted for about two weeks.       History of Present Illness: Debra Hogan is a 58 y.o. female who is here today for a follow-up regarding chest pain, shortness of breath and  moderate pulmonary hypertension.   The patient has chronic medical conditions that include fibromyalgia, multiple sclerosis, tobacco use, COPD, degenerative disc disease with chronic back pain and hyperlipidemia.  She has family history of coronary artery disease and she is a smoker.  She is on home oxygen at night. She had CT scan of the lungs in [place] 2019 which shows small pericardial effusion.   She had Lexiscan Myoview last year which showed no evidence of ischemia with normal ejection fraction. Echocardiogram showed normal LV systolic function with grade 1 diastolic dysfunction, normal RV systolic function and moderately elevated pulmonary pressure with estimated systolic pressure of 49 mmHg.  It was felt that her pulmonary hypertension was likely due to underlying lung disease.  She is on 2 L/min of oxygen therapy.  She reports worsening chest pain.  She had 2 recent episodes of sharp chest discomfort lasting only a few seconds.  In addition, she has been having more episodes of substernal chest tightness with activities with worsening exertional dyspnea.  She also had recent worsening of bilateral leg edema. She continues to smoke 1 pack/day and reports inability to quit given that multiple family members smoke in the house.    Past Medical History:  Diagnosis Date  . Arthritis   . COPD (chronic obstructive pulmonary disease) (HCC)   . Depression   . Fibromyalgia   . Irritable bowel  syndrome   . MS (multiple sclerosis) (HCC)    Followed by Dr. Sherryll Burger    Past Surgical History:  Procedure Laterality Date  . ABDOMINAL HYSTERECTOMY     CERVIX intact  . CHOLECYSTECTOMY N/A 01/03/2016   Procedure: LAPAROSCOPIC CHOLECYSTECTOMY WITH INTRAOPERATIVE CHOLANGIOGRAM;  Surgeon: Lattie Haw, MD;  Location: [facility] ORS;  Service: General;  Laterality: N/A;  . ENDOSCOPIC RETROGRADE CHOLANGIOPANCREATOGRAPHY (ERCP) WITH PROPOFOL N/A 01/04/2016   Procedure: ENDOSCOPIC RETROGRADE CHOLANGIOPANCREATOGRAPHY (ERCP) WITH PROPOFOL;  Surgeon: Midge Minium, MD;  Location: [facility] ENDOSCOPY;  Service: Endoscopy;  Laterality: N/A;  . FOOT SURGERY  2001  . LAPAROSCOPIC ENDOMETRIOSIS FULGURATION       Current Outpatient Medications  Medication Sig Dispense Refill  . acyclovir (ZOVIRAX) 400 MG tablet Take 1 tablet (400 mg total) by mouth daily. 90 tablet 3  . Albuterol Sulfate (PROAIR RESPICLICK) 108 (90 Base) MCG/ACT AEPB Inhale 90 mcg into the lungs every 6 (six) hours as needed (as needed for cough, wheezing). (Patient taking differently: Inhale 180 mcg into the lungs every 6 (six) hours as needed (as needed for cough, wheezing). ) 1 each 2  . aspirin 81 MG tablet Take 81 mg by mouth daily.    . baclofen (LIORESAL) 10 MG tablet Take 1 tablet (10 mg total) by mouth 3 (three) times daily as needed for muscle spasms. 30 each 4  . carbamazepine (TEGRETOL) 200 MG tablet Take 200 mg by mouth 2 (two) times daily.    Marland Kitchen  cholecalciferol (VITAMIN D) 1000 units tablet Take 1,000 Units by mouth daily.    . diclofenac sodium (VOLTAREN) 1 % GEL Apply 4 g topically 4 (four) times daily. 1 Tube 3  . Dimethyl Fumarate 240 MG CPDR Take 240 mg by mouth 2 (two) times daily.    . escitalopram (LEXAPRO) 20 MG tablet Take 1 tablet (20 mg total) by mouth daily. 90 tablet 1  . Fluticasone-Salmeterol (ADVAIR DISKUS) 250-50 MCG/DOSE AEPB Inhale 1 puff into the lungs 2 (two) times daily. 1 each 3  . lisinopril (PRINIVIL,ZESTRIL)  10 MG tablet Take 1 tablet (10 mg total) by mouth daily. 90 tablet 4  . Multiple Vitamins-Minerals (CENTRUM SILVER ULTRA WOMENS) TABS Take 1 tablet by mouth daily.    . ondansetron (ZOFRAN-ODT) 4 MG disintegrating tablet Take 1 tablet (4 mg total) by mouth every 6 (six) hours as needed for nausea or vomiting. 20 tablet 1  . rosuvastatin (CRESTOR) 20 MG tablet Take 1 tablet (20 mg total) by mouth daily. 90 tablet 3  . traZODone (DESYREL) 50 MG tablet Take 0.5-1 tablets (25-50 mg total) by mouth at bedtime as needed for sleep. 30 tablet 3  . Fluticasone-Salmeterol (ADVAIR DISKUS) 100-50 MCG/DOSE AEPB Inhale 1 puff into the lungs 2 (two) times daily as needed (for shortness of breath). 60 each 3   No current facility-administered medications for this visit.     Allergies:   Valium [diazepam]    Social History:  The patient  reports that she has been smoking. She has a 43.00 pack-year smoking history. She has never used smokeless tobacco. She reports that she does not drink alcohol or use drugs.   Family History:  The patient's family history includes Arthritis in her mother; Bipolar disorder in her son; Cancer in her father, maternal grandfather, and paternal grandmother; Heart disease in her brother; Hyperlipidemia in her mother; Hypertension in her mother; Schizophrenia in her son.    ROS:  Please see the history of present illness.   Otherwise, review of systems are positive for none.   All other systems are reviewed and negative.    PHYSICAL EXAM: VS:  BP 128/80 (BP Location: Left Arm, Patient Position: Sitting, Cuff Size: Normal)   Pulse 96   Temp (!) 97.3 F (36.3 C)   Ht 5' 5" (1.651 m)   Wt 167 lb 8 oz (76 kg)   SpO2 99%   BMI 27.87 kg/m  , BMI Body mass index is 27.87 kg/m. GEN: Well nourished, well developed, in no acute distress  HEENT: normal  Neck: no JVD, carotid bruits, or masses Cardiac: RRR; no murmurs, rubs, or gallops, mild bilateral leg edema Respiratory: Mildly  diminished breath sounds bilaterally GI: soft, nontender, nondistended, + BS MS: no deformity or atrophy  Skin: warm and dry, no rash Neuro:  Strength and sensation are intact Psych: euthymic mood, full affect Radial pulses normal bilaterally.  EKG:  EKG is ordered today. The ekg ordered today demonstrates normal sinus rhythm with no significant ST or T wave changes.   Recent Labs: 07/09/2019: Hemoglobin 13.9; Platelets 160.0; TSH 5.40 08/20/2019: ALT 18; BUN 10; Creatinine, Ser 0.71; Potassium 4.0; Sodium 141    Lipid Panel    Component Value Date/Time   CHOL 203 (H) 07/09/2019 1405   TRIG 285.0 (H) 07/09/2019 1405   HDL 46.00 07/09/2019 1405   CHOLHDL 4 07/09/2019 1405   VLDL 57.0 (H) 07/09/2019 1405   LDLCALC 136 (H) 09/19/2013 1640   LDLDIRECT 108.0 07/09/2019 1405        Wt Readings from Last 3 Encounters:  09/25/19 167 lb 8 oz (76 kg)  08/20/19 167 lb (75.8 kg)  07/10/19 158 lb (71.7 kg)       PAD Screen 11/28/2018  Previous PAD dx? Yes  Previous surgical procedure? No  Pain with walking? Yes  Subsides with rest? No  Feet/toe relief with dangling? Yes  Painful, non-healing ulcers? No  Extremities discolored? No      ASSESSMENT AND PLAN:   1.  Chest pain and exertional dyspnea: Symptoms are worrisome for class III angina with recent worsening.  Given recent worsening, I recommend proceeding with right and left cardiac catheterization possible PCI.  I discussed the procedure in details as well as risks and benefits.  2.  Moderate pulmonary hypertension: This is likely related to underlying lung disease although other etiologies cannot be completely excluded.  Given recent worsening of shortness of breath and leg edema, I recommend doing a right heart catheterization as outlined above.  3.  Tobacco use: I discussed the importance of smoking cessation.  4.  Essential hypertension: Blood pressures controlled on lisinopril.  5.  Hyperlipidemia: Currently on  rosuvastatin 20 mg daily.  Recent lipid profile showed an LDL of 108.  If cardiac cath shows significant coronary artery disease, we will need to be more aggressive with this.   Disposition:   FU with me in 1 months.  Signed,  Kathlyn Sacramento, MD  09/25/2019 10:43 AM    [place]

## 2019-09-25 NOTE — Patient Instructions (Addendum)
Medication Instructions:  Your physician recommends that you continue on your current medications as directed. Please refer to the Current Medication list given to you today.  If you need a refill on your cardiac medications before your next appointment, please call your pharmacy.   Lab work: 1- Your physician recommends that you return for lab work in: TODAY - BMET, CBC.   2- COVID PRE- TEST: You will need a COVID TEST prior to the procedure:  LOCATION: [facility] Pre-Op [facility]Thru Testing site.  DATE/TIME:  _______________________________________  If you have labs (blood work) drawn today and your tests are completely normal, you will receive your results only by: Marland Kitchen MyChart Message (if you have MyChart) OR . A paper copy in the mail If you have any lab test that is abnormal or we need to change your treatment, we will call you to review the results.  Testing/Procedures: Your physician has requested that you have a RIGHT AND LEFT cardiac catheterization. Cardiac catheterization is used to diagnose and/or treat various heart conditions. Doctors Buck recommend this procedure for a number of different reasons. The most common reason is to evaluate chest pain. Chest pain can be a symptom of coronary artery disease (CAD), and cardiac catheterization can show whether plaque is narrowing or blocking your heart's arteries. This procedure is also used to evaluate the valves, as well as measure the blood flow and oxygen levels in different parts of your heart. For further information please visit [email or web address] Please follow instruction sheet, as given.  [facility] Cardiac Cath Instructions   You are scheduled for a Cardiac Cath on:_________________________ (OCT 5 OR OCT 12)  Please arrive at _______am on the day of your procedure  Please expect a call from our [facility] Pre-Service Center to pre-register you  Do not eat/drink anything after midnight  Someone will need to drive you  home  It is recommended someone be with you for the first 24 hours after your procedure  Wear clothes that are easy to get on/off and wear slip on shoes if possible   Medications bring a current list of all medications with you  _XX_ You Kimble take all of your medications the morning of your procedure with enough water to swallow safely   Day of your procedure: Arrive at the [facility] entrance.  Free valet service is available.  After entering the [facility] please check-in at the registration desk (1st desk on your right) to receive your armband. After receiving your armband someone will escort you to the cardiac cath/special procedures waiting area.  The usual length of stay after your procedure is about 2 to 3 hours.  This can vary.  If you have any questions, please call our office at [phone], or you Rodden call the cardiac cath lab at [facility] directly at [phone]  Follow-Up: At [facility], you and your health needs are our priority.  As part of our continuing mission to provide you with exceptional heart care, we have created designated Provider Care Teams.  These Care Teams include your primary Cardiologist (physician) and Advanced Practice Providers (APPs -  Physician Assistants and Nurse Practitioners) who all work together to provide you with the care you need, when you need it. You will need a follow up appointment in 1 months.  Please call our office 2 months in advance to schedule this appointment.  You Roupp see Dr Lorine Bears or one of the following Advanced Practice Providers on your designated Care Team:  Nicolasa Ducking, NP Eula Listen, PA-C . Marisue Ivan, PA-C    Coronary Angiogram With Stent Coronary angiogram with stent placement is a procedure to widen or open a narrow blood vessel of the heart (coronary artery). Arteries Spratling become blocked by cholesterol buildup (plaques) in the lining of the wall. When a coronary artery becomes partially blocked,  blood flow to that area decreases. This Gowdy lead to chest pain or a heart attack (myocardial infarction). A stent is a small piece of metal that looks like mesh or a spring. Stent placement Cabal be done as treatment for a heart attack or right after a coronary angiogram in which a blocked artery is found. Let your health care provider know about:  Any allergies you have.  All medicines you are taking, including vitamins, herbs, eye drops, creams, and over-the-counter medicines.  Any problems you or family members have had with anesthetic medicines.  Any blood disorders you have.  Any surgeries you have had.  Any medical conditions you have.  Whether you are pregnant or Reichard be pregnant. What are the risks? Generally, this is a safe procedure. However, problems Dudas occur, including:  Damage to the heart or its blood vessels.  A return of blockage.  Bleeding, infection, or bruising at the insertion site.  A collection of blood under the skin (hematoma) at the insertion site.  A blood clot in another part of the body.  Kidney injury.  Allergic reaction to the dye or contrast that is used.  Bleeding into the abdomen (retroperitoneal bleeding). What happens before the procedure? Staying hydrated Follow instructions from your health care provider about hydration, which Semple include:  Up to 2 hours before the procedure - you Tondreau continue to drink clear liquids, such as water, clear fruit juice, black coffee, and plain tea.  Eating and drinking restrictions Follow instructions from your health care provider about eating and drinking, which Neuharth include:  8 hours before the procedure - stop eating heavy meals or foods such as meat, fried foods, or fatty foods.  6 hours before the procedure - stop eating light meals or foods, such as toast or cereal.  2 hours before the procedure - stop drinking clear liquids. Ask your health care provider about:  Changing or stopping your  regular medicines. This is especially important if you are taking diabetes medicines or blood thinners.  Taking medicines such as ibuprofen. These medicines can thin your blood. Do not take these medicines before your procedure if your health care provider instructs you not to. Generally, aspirin is recommended before a procedure of passing a small, thin tube (catheter) through a blood vessel and into the heart (cardiac catheterization). What happens during the procedure?   An IV tube will be inserted into one of your veins.  You will be given one or more of the following: ? A medicine to help you relax (sedative). ? A medicine to numb the area where the catheter will be inserted into an artery (local anesthetic).  To reduce your risk of infection: ? Your health care team will wash or sanitize their hands. ? Your skin will be washed with soap. ? Hair Dietzman be removed from the area where the catheter will be inserted.  Using a guide wire, the catheter will be inserted into an artery. The location Caprio be in your groin, in your wrist, or in the fold of your arm (near your elbow).  A type of X-ray (fluoroscopy) will be used to help guide  the catheter to the opening of the arteries in the heart.  A dye will be injected into the catheter, and X-rays will be taken. The dye will help to show where any narrowing or blockages are located in the arteries.  A tiny wire will be guided to the blocked spot, and a balloon will be inflated to make the artery wider.  The stent will be expanded and will crush the plaques into the wall of the vessel. The stent will hold the area open and improve the blood flow. Most stents have a drug coating to reduce the risk of the stent narrowing over time.  The artery Beharry be made wider using a drill, laser, or other tools to remove plaques.  When the blood flow is better, the catheter will be removed. The lining of the artery will grow over the stent, which stays where  it was placed. This procedure Walpole vary among health care providers and hospitals. What happens after the procedure?  If the procedure is done through the leg, you will be kept in bed lying flat for about 6 hours. You will be instructed to not bend and not cross your legs.  The insertion site will be checked frequently.  The pulse in your foot or wrist will be checked frequently.  You Sharma have additional blood tests, X-rays, and a test that records the electrical activity of your heart (electrocardiogram, or ECG). This information is not intended to replace advice given to you by your health care provider. Make sure you discuss any questions you have with your health care provider. Document Released: 06/17/2003 Document Revised: 03/22/2018 Document Reviewed: 07/16/2016 Elsevier Patient Education  2020 [employer].

## 2019-09-26 ENCOUNTER — Other Ambulatory Visit
Admission: RE | Admit: 2019-09-26 | Discharge: 2019-09-26 | Disposition: A | Discharge status: Home / Self Care | Payer: Medicare Other | Source: Ambulatory Visit | Attending: Cardiovascular Disease | Admitting: Cardiovascular Disease

## 2019-09-26 DIAGNOSIS — Z01812 Encounter for preprocedural laboratory examination: Secondary | ICD-10-CM | POA: Insufficient documentation

## 2019-09-26 DIAGNOSIS — Z20828 Contact with and (suspected) exposure to other viral communicable diseases: Secondary | ICD-10-CM | POA: Diagnosis not present

## 2019-09-26 LAB — CBC WITH DIFFERENTIAL/PLATELET
Basophils Absolute: 0 10*3/uL (ref 0.0–0.2)
Basos: 1 %
EOS (ABSOLUTE): 0.1 10*3/uL (ref 0.0–0.4)
Eos: 2 %
Hematocrit: 42.8 % (ref 34.0–46.6)
Hemoglobin: 14.8 g/dL (ref 11.1–15.9)
Immature Grans (Abs): 0 10*3/uL (ref 0.0–0.1)
Immature Granulocytes: 1 %
Lymphocytes Absolute: 0.5 10*3/uL — ABNORMAL LOW (ref 0.7–3.1)
Lymphs: 11 %
MCH: 32.6 pg (ref 26.6–33.0)
MCHC: 34.6 g/dL (ref 31.5–35.7)
MCV: 94 fL (ref 79–97)
Monocytes Absolute: 0.5 10*3/uL (ref 0.1–0.9)
Monocytes: 12 %
Neutrophils Absolute: 3.5 10*3/uL (ref 1.4–7.0)
Neutrophils: 73 %
Platelets: 196 10*3/uL (ref 150–450)
RBC: 4.54 x10E6/uL (ref 3.77–5.28)
RDW: 12.2 % (ref 11.7–15.4)
WBC: 4.7 10*3/uL (ref 3.4–10.8)

## 2019-09-26 LAB — BASIC METABOLIC PANEL
BUN/Creatinine Ratio: 18 (ref 12–28)
BUN: 12 mg/dL (ref 8–27)
CO2: 24 mmol/L (ref 20–29)
Calcium: 9.1 mg/dL (ref 8.7–10.3)
Chloride: 96 mmol/L (ref 96–106)
Creatinine, Ser: 0.66 mg/dL (ref 0.57–1.00)
GFR calc Af Amer: 111 mL/min/{1.73_m2} (ref >59)
GFR calc non Af Amer: 96 mL/min/{1.73_m2} (ref >59)
Glucose: 93 mg/dL (ref 65–99)
Potassium: 4.6 mmol/L (ref 3.5–5.2)
Sodium: 133 mmol/L — ABNORMAL LOW (ref 134–144)

## 2019-09-27 LAB — SARS CORONAVIRUS 2 (TAT 6-24 HRS): SARS Coronavirus 2: NEGATIVE

## 2019-09-29 ENCOUNTER — Encounter
Admission: RE | Disposition: A | Discharge status: Home / Self Care | Payer: Self-pay | Source: Home / Self Care | Attending: Cardiovascular Disease

## 2019-09-29 ENCOUNTER — Ambulatory Visit
Admission: RE | Admit: 2019-09-29 | Discharge: 2019-09-29 | Disposition: A | Discharge status: Home / Self Care | Payer: Medicare Other | Attending: Cardiovascular Disease | Admitting: Cardiovascular Disease

## 2019-09-29 ENCOUNTER — Other Ambulatory Visit: Payer: Self-pay

## 2019-09-29 ENCOUNTER — Encounter: Payer: Self-pay | Admitting: *Deleted

## 2019-09-29 DIAGNOSIS — M1990 Unspecified osteoarthritis, unspecified site: Secondary | ICD-10-CM | POA: Diagnosis not present

## 2019-09-29 DIAGNOSIS — Z7951 Long term (current) use of inhaled steroids: Secondary | ICD-10-CM | POA: Insufficient documentation

## 2019-09-29 DIAGNOSIS — I272 Pulmonary hypertension, unspecified: Secondary | ICD-10-CM | POA: Diagnosis present

## 2019-09-29 DIAGNOSIS — F329 Major depressive disorder, single episode, unspecified: Secondary | ICD-10-CM | POA: Diagnosis not present

## 2019-09-29 DIAGNOSIS — F17210 Nicotine dependence, cigarettes, uncomplicated: Secondary | ICD-10-CM | POA: Diagnosis not present

## 2019-09-29 DIAGNOSIS — K589 Irritable bowel syndrome without diarrhea: Secondary | ICD-10-CM | POA: Diagnosis not present

## 2019-09-29 DIAGNOSIS — Z7982 Long term (current) use of aspirin: Secondary | ICD-10-CM | POA: Diagnosis not present

## 2019-09-29 DIAGNOSIS — R600 Localized edema: Secondary | ICD-10-CM | POA: Diagnosis not present

## 2019-09-29 DIAGNOSIS — Z9981 Dependence on supplemental oxygen: Secondary | ICD-10-CM | POA: Diagnosis not present

## 2019-09-29 DIAGNOSIS — G35 Multiple sclerosis: Secondary | ICD-10-CM | POA: Diagnosis not present

## 2019-09-29 DIAGNOSIS — G8929 Other chronic pain: Secondary | ICD-10-CM | POA: Insufficient documentation

## 2019-09-29 DIAGNOSIS — J449 Chronic obstructive pulmonary disease, unspecified: Secondary | ICD-10-CM | POA: Insufficient documentation

## 2019-09-29 DIAGNOSIS — Z8249 Family history of ischemic heart disease and other diseases of the circulatory system: Secondary | ICD-10-CM | POA: Insufficient documentation

## 2019-09-29 DIAGNOSIS — E785 Hyperlipidemia, unspecified: Secondary | ICD-10-CM | POA: Insufficient documentation

## 2019-09-29 DIAGNOSIS — M797 Fibromyalgia: Secondary | ICD-10-CM | POA: Diagnosis not present

## 2019-09-29 DIAGNOSIS — I2510 Atherosclerotic heart disease of native coronary artery without angina pectoris: Secondary | ICD-10-CM

## 2019-09-29 DIAGNOSIS — I10 Essential (primary) hypertension: Secondary | ICD-10-CM | POA: Diagnosis not present

## 2019-09-29 DIAGNOSIS — R079 Chest pain, unspecified: Secondary | ICD-10-CM | POA: Insufficient documentation

## 2019-09-29 DIAGNOSIS — Z79899 Other long term (current) drug therapy: Secondary | ICD-10-CM | POA: Diagnosis not present

## 2019-09-29 HISTORY — DX: Dyspnea, unspecified: R06.00

## 2019-09-29 HISTORY — DX: Angina pectoris, unspecified: I20.9

## 2019-09-29 HISTORY — PX: RIGHT/LEFT HEART CATH AND CORONARY ANGIOGRAPHY: CATH118266

## 2019-09-29 HISTORY — DX: Headache, unspecified: R51.9

## 2019-09-29 SURGERY — RIGHT/LEFT HEART CATH AND CORONARY ANGIOGRAPHY
Anesthesia: Moderate Sedation | Laterality: Bilateral

## 2019-09-29 MED ORDER — SODIUM CHLORIDE 0.9% FLUSH: 3.0000 mL | INTRAVENOUS | Status: DC | PRN

## 2019-09-29 MED ORDER — LABETALOL HCL 5 MG/ML IV SOLN: 10.0000 mg | INTRAVENOUS | Status: DC | PRN

## 2019-09-29 MED ORDER — ONDANSETRON HCL 4 MG/2ML IJ SOLN: 4.0000 mg | Freq: Four times a day (QID) | INTRAMUSCULAR | Status: DC | PRN

## 2019-09-29 MED ORDER — SODIUM CHLORIDE 0.9% FLUSH: 3.0000 mL | Freq: Two times a day (BID) | INTRAVENOUS | Status: DC

## 2019-09-29 MED ORDER — SODIUM CHLORIDE 0.9 % IV SOLN: 250.0000 mL | INTRAVENOUS | Status: DC | PRN

## 2019-09-29 MED ORDER — HEPARIN (PORCINE) IN NACL 1000-0.9 UT/500ML-% IV SOLN
INTRAVENOUS | Status: DC | PRN
  Administered 2019-09-29: 500 mL

## 2019-09-29 MED ORDER — NITROGLYCERIN 1 MG/10 ML FOR IR/CATH LAB
INTRA_ARTERIAL | Status: AC
  Filled 2019-09-29: qty 10

## 2019-09-29 MED ORDER — ACETAMINOPHEN 325 MG PO TABS
650.0000 mg | ORAL_TABLET | ORAL | Status: DC | PRN
  Administered 2019-09-29: 650 mg via ORAL

## 2019-09-29 MED ORDER — DILTIAZEM HCL ER COATED BEADS 120 MG PO CP24: 120.0000 mg | ORAL_CAPSULE | Freq: Every day | ORAL | 11 refills | Status: DC

## 2019-09-29 MED ORDER — ASPIRIN 81 MG PO CHEW
CHEWABLE_TABLET | ORAL | Status: AC
  Filled 2019-09-29: qty 1

## 2019-09-29 MED ORDER — IOHEXOL 300 MG/ML  SOLN
INTRAMUSCULAR | Status: DC | PRN
  Administered 2019-09-29: 65 mL

## 2019-09-29 MED ORDER — FENTANYL CITRATE (PF) 100 MCG/2ML IJ SOLN
INTRAMUSCULAR | Status: DC | PRN
  Administered 2019-09-29 (×3): 25 ug via INTRAVENOUS

## 2019-09-29 MED ORDER — HEPARIN SODIUM (PORCINE) 1000 UNIT/ML IJ SOLN
INTRAMUSCULAR | Status: DC | PRN
  Administered 2019-09-29: 3500 [IU] via INTRAVENOUS

## 2019-09-29 MED ORDER — SODIUM CHLORIDE 0.9 % IV SOLN: INTRAVENOUS | Status: DC

## 2019-09-29 MED ORDER — HEPARIN SODIUM (PORCINE) 1000 UNIT/ML IJ SOLN
INTRAMUSCULAR | Status: AC
  Filled 2019-09-29: qty 1

## 2019-09-29 MED ORDER — VERAPAMIL HCL 2.5 MG/ML IV SOLN
INTRAVENOUS | Status: DC | PRN
  Administered 2019-09-29 (×2): 2.5 mg via INTRA_ARTERIAL

## 2019-09-29 MED ORDER — MIDAZOLAM HCL 2 MG/2ML IJ SOLN
INTRAMUSCULAR | Status: DC | PRN
  Administered 2019-09-29 (×2): 1 mg via INTRAVENOUS

## 2019-09-29 MED ORDER — FENTANYL CITRATE (PF) 100 MCG/2ML IJ SOLN
INTRAMUSCULAR | Status: AC
  Filled 2019-09-29: qty 2

## 2019-09-29 MED ORDER — NITROGLYCERIN 0.4 MG SL SUBL: 0.4000 mg | SUBLINGUAL_TABLET | SUBLINGUAL | 3 refills | Status: DC | PRN

## 2019-09-29 MED ORDER — SODIUM CHLORIDE 0.9 % IV SOLN
INTRAVENOUS | Status: DC
  Administered 2019-09-29: 10:00:00 via INTRAVENOUS

## 2019-09-29 MED ORDER — HYDRALAZINE HCL 20 MG/ML IJ SOLN: 10.0000 mg | INTRAMUSCULAR | Status: DC | PRN

## 2019-09-29 MED ORDER — HEPARIN (PORCINE) IN NACL 1000-0.9 UT/500ML-% IV SOLN
INTRAVENOUS | Status: AC
  Filled 2019-09-29: qty 1000

## 2019-09-29 MED ORDER — NITROGLYCERIN 1 MG/10 ML FOR IR/CATH LAB
INTRA_ARTERIAL | Status: DC | PRN
  Administered 2019-09-29: 200 ug via INTRACORONARY

## 2019-09-29 MED ORDER — VERAPAMIL HCL 2.5 MG/ML IV SOLN
INTRAVENOUS | Status: AC
  Filled 2019-09-29: qty 2

## 2019-09-29 MED ORDER — ASPIRIN 81 MG PO CHEW: 81.0000 mg | CHEWABLE_TABLET | ORAL | Status: DC

## 2019-09-29 MED ORDER — MIDAZOLAM HCL 2 MG/2ML IJ SOLN
INTRAMUSCULAR | Status: AC
  Filled 2019-09-29: qty 2

## 2019-09-29 MED ORDER — ACETAMINOPHEN 325 MG PO TABS
ORAL_TABLET | ORAL | Status: AC
  Filled 2019-09-29: qty 2

## 2019-09-29 SURGICAL SUPPLY — 10 items
CATH 5F 110X4 TIG (CATHETERS) ×2 IMPLANT
CATH BALLN WEDGE 5F 110CM (CATHETERS) ×2 IMPLANT
CATH INFINITI 5FR ANG PIGTAIL (CATHETERS) ×2 IMPLANT
DEVICE RAD TR BAND REGULAR (VASCULAR PRODUCTS) ×2 IMPLANT
GLIDESHEATH SLEND SS 6F .021 (SHEATH) ×2 IMPLANT
KIT MANI 3VAL PERCEP (MISCELLANEOUS) ×3 IMPLANT
KIT RIGHT HEART (MISCELLANEOUS) ×3 IMPLANT
PACK CARDIAC CATH (CUSTOM PROCEDURE TRAY) ×3 IMPLANT
SHEATH GLIDE SLENDER 4/5FR (SHEATH) ×4 IMPLANT
WIRE ROSEN-J .035X260CM (WIRE) ×2 IMPLANT

## 2019-09-29 NOTE — Interval H&P Note (Signed)
History and Physical Interval Note:  09/29/2019 12:10 PM  Debra Hogan  has presented today for cardiac catheterization, with the diagnosis of chest pain and pulmonary hypertension.  The various methods of treatment have been discussed with the patient and family. After consideration of risks, benefits and other options for treatment, the patient has consented to  Procedure(s): RIGHT/LEFT HEART CATH AND CORONARY ANGIOGRAPHY (Bilateral) as a surgical intervention.  The patient's history has been reviewed, patient examined, no change in status, stable for surgery.  I have reviewed the patient's chart and labs.  Questions were answered to the patient's satisfaction.    Cath Lab Visit (complete for each Cath Lab visit)  Clinical Evaluation Leading to the Procedure:   ACS: No.  Non-ACS:    Anginal Classification: CCS IV  Anti-ischemic medical therapy: No Therapy  Non-Invasive Test Results: Low-risk stress test findings: cardiac mortality <1%/year  Prior CABG: No previous CABG  [name]

## 2019-09-29 NOTE — Discharge Instructions (Signed)
Call Dr Fletcher Anon office for follow up appointment in 1 week.     Coronary Angiogram A coronary angiogram is an X-ray procedure that is used to examine the arteries in the heart. In this procedure, a dye (contrast dye) is injected through a long, thin tube (catheter). The catheter is inserted through the groin, wrist, or arm. The dye is injected into each artery, then X-rays are taken to show if there is a blockage in the arteries of the heart. This procedure can also show if you have valve disease or a disease of the aorta, and it can be used to check the overall function of your heart muscle. You Offutt have a coronary angiogram if: You are having chest pain, or other symptoms of angina, and you are at risk for heart disease. You have an abnormal electrocardiogram (ECG) or stress test. You have chest pain and heart failure. You are having irregular heart rhythms. You and your health care provider determine that the benefits of the test information outweigh the risks of the procedure. Let your health care provider know about: Any allergies you have, including allergies to contrast dye. All medicines you are taking, including vitamins, herbs, eye drops, creams, and over-the-counter medicines. Any problems you or family members have had with anesthetic medicines. Any blood disorders you have. Any surgeries you have had. History of kidney problems or kidney failure. Any medical conditions you have. Whether you are pregnant or Syracuse be pregnant. What are the risks? Generally, this is a safe procedure. However, problems Bardwell occur, including: Infection. Allergic reaction to medicines or dyes that are used. Bleeding from the access site or other locations. Kidney injury, especially in people with impaired kidney function. Stroke (rare). Heart attack (rare). Damage to other structures or organs. What happens before the procedure? Staying hydrated Follow instructions from your health care provider  about hydration, which Madry include: Up to 2 hours before the procedure - you Ikard continue to drink clear liquids, such as water, clear fruit juice, black coffee, and plain tea. Eating and drinking restrictions Follow instructions from your health care provider about eating and drinking, which Maslowski include: 8 hours before the procedure - stop eating heavy meals or foods such as meat, fried foods, or fatty foods. 6 hours before the procedure - stop eating light meals or foods, such as toast or cereal. 2 hours before the procedure - stop drinking clear liquids. General instructions Ask your health care provider about: Changing or stopping your regular medicines. This is especially important if you are taking diabetes medicines or blood thinners. Taking medicines such as ibuprofen. These medicines can thin your blood. Do not take these medicines before your procedure if your health care provider instructs you not to, though aspirin Auguste be recommended prior to coronary angiograms. Plan to have someone take you home from the hospital or clinic. You Townley need to have blood tests or X-rays done. What happens during the procedure? An IV tube will be inserted into one of your veins. You will be given one or more of the following: A medicine to help you relax (sedative). A medicine to numb the area where the catheter will be inserted into an artery (local anesthetic). To reduce your risk of infection: Your health care team will wash or sanitize their hands. Your skin will be washed with soap. Hair Chick be removed from the area where the catheter will be inserted. You will be connected to a continuous ECG monitor. The catheter will be  inserted into an artery. The location Jesson be in your groin, in your wrist, or in the fold of your arm (near your elbow). A type of X-ray (fluoroscopy) will be used to help guide the catheter to the opening of the blood vessel that is being examined. A dye will be injected  into the catheter, and X-rays will be taken. The dye will help to show where any narrowing or blockages are located in the heart arteries. Tell your health care provider if you have any chest pain or trouble breathing during the procedure. If blockages are found, your health care provider Partch perform another procedure, such as inserting a coronary stent. The procedure Robledo vary among health care providers and hospitals. What happens after the procedure? After the procedure, you will need to keep the area still for a few hours, or for as long as told by your health care provider. If the procedure is done through the groin, you will be instructed to not bend and not cross your legs. The insertion site will be checked frequently. The pulse in your foot or wrist will be checked frequently. You Triplett have additional blood tests, X-rays, and a test that records the electrical activity of your heart (ECG). Do not drive for 24 hours if you were given a sedative. Summary A coronary angiogram is an X-ray procedure that is used to look into the arteries in the heart. During the procedure, a dye (contrast dye) is injected through a long, thin tube (catheter). The catheter is inserted through the groin, wrist, or arm. Tell your health care provider about any allergies you have, including allergies to contrast dye. After the procedure, you will need to keep the area still for a few hours, or for as long as told by your health care provider. This information is not intended to replace advice given to you by your health care provider. Make sure you discuss any questions you have with your health care provider. Document Released: 06/17/2003 Document Revised: 11/23/2017 Document Reviewed: 09/22/2016 Elsevier Patient Education  2020 Else[employer] Inc[employer] Radial Site Care  This sheet gives you information about how to care for yourself after your procedure. Your health care provider Gitto also give you more specific  instructions. If you have problems or questions, contact your health care provider. What can I expect after the procedure? After the procedure, it is common to have:  Bruising and tenderness at the catheter insertion area. Follow these instructions at home: Medicines  Take over-the-counter and prescription medicines only as told by your health care provider. Insertion site care  Follow instructions from your health care provider about how to take care of your insertion site. Make sure you: ? Wash your hands with soap and water before you change your bandage (dressing). If soap and water are not available, use hand sanitizer. ? Change your dressing as told by your health care provider. ? Leave stitches (sutures), skin glue, or adhesive strips in place. These skin closures Fackrell need to stay in place for 2 weeks or longer. If adhesive strip edges start to loosen and curl up, you Tomei trim the loose edges. Do not remove adhesive strips completely unless your health care provider tells you to do that.  Check your insertion site every day for signs of infection. Check for: ? Redness, swelling, or pain. ? Fluid or blood. ? Pus or a bad smell. ? Warmth.  Do not take baths, swim, or use a hot tub until your health care provider  approves.  You Seckinger shower 24-48 hours after the procedure, or as directed by your health care provider. ? Remove the dressing and gently wash the site with plain soap and water. ? Pat the area dry with a clean towel. ? Do not rub the site. That could cause bleeding.  Do not apply powder or lotion to the site. Activity   For 24 hours after the procedure, or as directed by your health care provider: ? Do not flex or bend the affected arm. ? Do not push or pull heavy objects with the affected arm. ? Do not drive yourself home from the hospital or clinic. You Hilgert drive 24 hours after the procedure unless your health care provider tells you not to. ? Do not operate  machinery or power tools.  Do not lift anything that is heavier than 10 lb (4.5 kg), or the limit that you are told, until your health care provider says that it is safe.  Ask your health care provider when it is okay to: ? Return to work or school. ? Resume usual physical activities or sports. ? Resume sexual activity. General instructions  If the catheter site starts to bleed, raise your arm and put firm pressure on the site. If the bleeding does not stop, get help right away. This is a medical emergency.  If you went home on the same day as your procedure, a responsible adult should be with you for the first 24 hours after you arrive home.  Keep all follow-up visits as told by your health care provider. This is important. Contact a health care provider if:  You have a fever.  You have redness, swelling, or yellow drainage around your insertion site. Get help right away if:  You have unusual pain at the radial site.  The catheter insertion area swells very fast.  The insertion area is bleeding, and the bleeding does not stop when you hold steady pressure on the area.  Your arm or hand becomes pale, cool, tingly, or numb. These symptoms Atienza represent a serious problem that is an emergency. Do not wait to see if the symptoms will go away. Get medical help right away. Call your local emergency services (911 in the [place] Do not drive yourself to the hospital. Summary  After the procedure, it is common to have bruising and tenderness at the site.  Follow instructions from your health care provider about how to take care of your radial site wound. Check the wound every day for signs of infection.  Do not lift anything that is heavier than 10 lb (4.5 kg), or the limit that you are told, until your health care provider says that it is safe. This information is not intended to replace advice given to you by your health care provider. Make sure you discuss any questions you have with  your health care provider. Document Released: 01/13/2011 Document Revised: 01/16/2018 Document Reviewed: 01/16/2018 Elsevier Patient Education  2020 [employer].

## 2019-09-29 NOTE — OR Nursing (Signed)
Debra Hogan from cath lab reviewed right ac iv site, plan to defer to Dr Fletcher Anon placing second IV access in cath lab due to small veins

## 2019-09-29 NOTE — Brief Op Note (Signed)
09/29/2019  1:11 PM  PATIENT:  Debra Hogan  58 y.o. female  PRE-OPERATIVE DIAGNOSIS:  Chest pain and pulmonary hypertension  POST-OPERATIVE DIAGNOSIS:  Same  PROCEDURE:  Procedure(s): RIGHT/LEFT HEART CATH AND CORONARY ANGIOGRAPHY (Bilateral)  SURGEON:  Surgeon(s) and Role:    * Graciano Batson, MD - Primary  FINDINGS: 1. No significant atherosclerotic CAD.  Vasospasm noted at the ostial RCA, which improved with IC NTG. 2. Hyperdynamic LVEF with upper normal left heart filling pressures. 3. Mild pulmonary hypertension. 4. Moderately elevated right heart filling pressures. 5. Normal Fick cardiac output/index.  RECOMMENDATIONS: 1. Medical therapy; will start low-dose diltiazem and prn sublingual NTG. 2. Primary prevention of CAD. 3. Continued treatment of underlying pulmonary disease.  Nelva Bush, MD [facility] Pager: [phone]

## 2019-09-30 ENCOUNTER — Encounter: Payer: Self-pay | Admitting: Internal Medicine

## 2019-09-30 ENCOUNTER — Telehealth: Payer: Self-pay | Admitting: Cardiovascular Disease

## 2019-09-30 NOTE — Telephone Encounter (Signed)
-----   Message from Nelva Bush, MD sent at 09/29/2019  4:42 PM EDT ----- Regarding: Post-cath f/u Hello,  Could you schedule Ms. Drach to see Dr. Fletcher Anon or an APP in 2-4 weeks for post-cath follow-up?  Thanks.  Gerald Stabs

## 2019-09-30 NOTE — Telephone Encounter (Signed)
Spoke with patient to schedule 2-4 week follow up  Patient did not have calender available - will call back to schedule

## 2019-10-08 NOTE — Unmapped (Signed)
[facility] Refill Coordination Note    Specialty Medication(s) to be Shipped:   Neurology: Tecfidera    Other medication(s) to be shipped: n/a     Jennifer Lewis, DOB: Nov 14, 1961  Phone: [phone] (home)       All above HIPAA information was verified with patient.     Completed refill call assessment today to schedule patient's medication shipment from the [facility] [phone]).       Specialty medication(s) and dose(s) confirmed: Regimen is correct and unchanged.   Changes to medications: Jennifer Lewis reports no changes at this time.  Changes to insurance: No  Questions for the pharmacist: No    Confirmed patient received Welcome Packet with first shipment. The patient will receive a drug information handout for each medication shipped and additional FDA Medication Guides as required.       DISEASE/MEDICATION-SPECIFIC INFORMATION        N/A    SPECIALTY MEDICATION ADHERENCE     Medication Adherence    Patient reported X missed doses in the last month: 0  Specialty Medication: tecfidera  Patient is on additional specialty medications: No  Patient is on more than two specialty medications: No  Any gaps in refill history greater than 2 weeks in the last 3 months: no  Demonstrates understanding of importance of adherence: yes  Informant: patient  Adherence tools used: patient uses a pill box to manage medications   Other adherence tool: leaves medicine on dining table to take with meals                 Tecfidera 240mg : Patient has 3 days of medication on hand       SHIPPING     Shipping address confirmed in Epic.     Delivery Scheduled: Yes, Expected medication delivery date: 10/10/19.     Medication will be delivered via Next Day Courier to the home address in Epic WAM.    [name]   [facility] Specialty Technician

## 2019-10-09 ENCOUNTER — Other Ambulatory Visit: Payer: Self-pay

## 2019-10-09 ENCOUNTER — Ambulatory Visit (INDEPENDENT_AMBULATORY_CARE_PROVIDER_SITE_OTHER): Payer: Medicare Other | Admitting: Family Medicine

## 2019-10-09 DIAGNOSIS — G4700 Insomnia, unspecified: Secondary | ICD-10-CM

## 2019-10-09 MED FILL — TECFIDERA 240 MG CAPSULE,DELAYED RELEASE: 30 days supply | Qty: 60 | Fill #2 | Status: AC

## 2019-10-09 MED FILL — TECFIDERA 240 MG CAPSULE,DELAYED RELEASE: ORAL | 30 days supply | Qty: 60 | Fill #2

## 2019-10-09 NOTE — Progress Notes (Signed)
Patient ID: Debra Hogan, female   DOB: 1961/01/31, 58 y.o.   MRN: [id number]    Virtual Visit via video Note  This visit type was conducted due to national recommendations for restrictions regarding the COVID-19 pandemic (e.g. social distancing).  This format is felt to be most appropriate for this patient at this time.  All issues noted in this document were discussed and addressed.  No physical exam was performed (except for noted visual exam findings with Video Visits).   I connected with Debra Hogan today at  3:40 PM EDT by a video enabled telemedicine application and verified that I am speaking with the correct person using two identifiers. Location patient: home Location provider: work or home office Persons participating in the virtual visit: patient, provider  I discussed the limitations, risks, security and privacy concerns of performing an evaluation and management service by video and the availability of in person appointments. I also discussed with the patient that there [place] be a patient responsible charge related to this service. The patient expressed understanding and agreed to proceed.   HPI:  Patient and I connected via video to discuss insomnia and follow-up on new medication trazodone.  Currently patient is taking 25 mg daily of trazodone.  States this medicine does not seem to be helping much.  Still wakes up almost every hour throughout the day and often 4 in the morning she just gets up for the day.  Patient does have multiple other medical issues including multiple sclerosis and often feels like "her body never shuts down".  She does try to do a routine before bed, usually watch TV and then when she is not watching TV will take a shower and get into bed.  Does turn the TV off before getting into bed.  Denies any more stress than her usual.  Denies any SI or HI.  Overall feels her mood is controlled with Lexapro.   ROS: See pertinent positives and negatives per HPI.   Past Medical History:  Diagnosis Date  . Anginal pain (HCC)   . Arthritis   . COPD (chronic obstructive pulmonary disease) (HCC)   . Depression   . Dyspnea   . Fibromyalgia   . Headache   . Heat stroke 2006  . Irritable bowel syndrome   . MS (multiple sclerosis) (HCC)    Followed by Dr. Sherryll Burger    Past Surgical History:  Procedure Laterality Date  . ABDOMINAL HYSTERECTOMY     CERVIX intact  . CHOLECYSTECTOMY N/A 01/03/2016   Procedure: LAPAROSCOPIC CHOLECYSTECTOMY WITH INTRAOPERATIVE CHOLANGIOGRAM;  Surgeon: Lattie Haw, MD;  Location: [facility] ORS;  Service: General;  Laterality: N/A;  . ENDOSCOPIC RETROGRADE CHOLANGIOPANCREATOGRAPHY (ERCP) WITH PROPOFOL N/A 01/04/2016   Procedure: ENDOSCOPIC RETROGRADE CHOLANGIOPANCREATOGRAPHY (ERCP) WITH PROPOFOL;  Surgeon: Midge Minium, MD;  Location: [facility] ENDOSCOPY;  Service: Endoscopy;  Laterality: N/A;  . FOOT SURGERY  2001  . LAPAROSCOPIC ENDOMETRIOSIS FULGURATION    . RIGHT/LEFT HEART CATH AND CORONARY ANGIOGRAPHY Bilateral 09/29/2019   Procedure: RIGHT/LEFT HEART CATH AND CORONARY ANGIOGRAPHY;  Surgeon: Yvonne Kendall, MD;  Location: [facility] IN[facility]IVE [facility];  Service: Cardiovascular;  Laterality: Bilateral;    Family History  Problem Relation Age of Onset  . Hypertension Mother   . Hyperlipidemia Mother   . Arthritis Mother   . Cancer Father        throat  . Heart disease Brother   . Bipolar disorder Son   . Schizophrenia Son   . Cancer Maternal Grandfather  liver  . Cancer Paternal Grandmother        liver    Current Outpatient Medications:  .  acyclovir (ZOVIRAX) 400 MG tablet, Take 1 tablet (400 mg total) by mouth daily., Disp: 90 tablet, Rfl: 3 .  Albuterol Sulfate (PROAIR RESPICLICK) 108 (90 Base) MCG/ACT AEPB, Inhale 90 mcg into the lungs every 6 (six) hours as needed (as needed for cough, wheezing). (Patient taking differently: Inhale 180 mcg into the lungs every 6 (six) hours as needed (as needed for cough, wheezing).  ), Disp: 1 each, Rfl: 2 .  baclofen (LIORESAL) 10 MG tablet, Take 1 tablet (10 mg total) by mouth 3 (three) times daily as needed for muscle spasms., Disp: 30 each, Rfl: 4 .  carbamazepine (TEGRETOL) 200 MG tablet, Take 200 mg by mouth 2 (two) times daily., Disp: , Rfl:  .  cholecalciferol (VITAMIN D) 1000 units tablet, Take 1,000 Units by mouth daily., Disp: , Rfl:  .  diclofenac sodium (VOLTAREN) 1 % GEL, Apply 4 g topically 4 (four) times daily., Disp: 1 Tube, Rfl: 3 .  diltiazem (CARDIZEM CD) 120 MG 24 hr capsule, Take 1 capsule (120 mg total) by mouth daily., Disp: 30 capsule, Rfl: 11 .  Dimethyl Fumarate 240 MG CPDR, Take 240 mg by mouth 2 (two) times daily., Disp: , Rfl:  .  escitalopram (LEXAPRO) 20 MG tablet, Take 1 tablet (20 mg total) by mouth daily., Disp: 90 tablet, Rfl: 1 .  Fluticasone-Salmeterol (ADVAIR DISKUS) 250-50 MCG/DOSE AEPB, Inhale 1 puff into the lungs 2 (two) times daily., Disp: 1 each, Rfl: 3 .  lisinopril (PRINIVIL,ZESTRIL) 10 MG tablet, Take 1 tablet (10 mg total) by mouth daily., Disp: 90 tablet, Rfl: 4 .  Multiple Vitamins-Minerals (CENTRUM SILVER ULTRA WOMENS) TABS, Take 1 tablet by mouth daily., Disp: , Rfl:  .  nitroGLYCERIN (NITROSTAT) 0.4 MG SL tablet, Place 1 tablet (0.4 mg total) under the tongue every 5 (five) minutes as needed for chest pain., Disp: 25 tablet, Rfl: 3 .  ondansetron (ZOFRAN-ODT) 4 MG disintegrating tablet, Take 1 tablet (4 mg total) by mouth every 6 (six) hours as needed for nausea or vomiting., Disp: 20 tablet, Rfl: 1 .  rosuvastatin (CRESTOR) 20 MG tablet, Take 1 tablet (20 mg total) by mouth daily., Disp: 90 tablet, Rfl: 3 .  traZODone (DESYREL) 50 MG tablet, Take 0.5-1 tablets (25-50 mg total) by mouth at bedtime as needed for sleep., Disp: 30 tablet, Rfl: 3 .  Fluticasone-Salmeterol (ADVAIR DISKUS) 100-50 MCG/DOSE AEPB, Inhale 1 puff into the lungs 2 (two) times daily as needed (for shortness of breath)., Disp: 60 each, Rfl: 3  EXAM:   GENERAL: alert, oriented, appears well and in no acute distress  HEENT: atraumatic, conjunttiva clear, no obvious abnormalities on inspection of external nose and ears  NECK: normal movements of the head and neck  LUNGS: on inspection no signs of respiratory distress, breathing rate appears normal, no obvious gross SOB, gasping or wheezing  CV: no obvious cyanosis  MS: moves all visible extremities without noticeable abnormality  PSYCH/NEURO: pleasant and cooperative, no obvious depression or anxiety, speech and thought processing grossly intact  ASSESSMENT AND PLAN:  Discussed the following assessment and plan:  Insomnia-suspect insomnia is related partially to history of anxiety as well as issues.  Advised patient that she is on the lowest dose currently, so we can increase this dose.  She will try 50 mg every night for at least 2 weeks and then bump up to 100  mg if the 50 mg is continuing to be ineffective.  Also discussed having a good wind down routine and avoiding screen time on phone and television at least 30 minutes before bed.  Being in a dark quiet room and taking some deep breaths to help relax body and get it ready for sleep.   I discussed the assessment and treatment plan with the patient. The patient was provided an opportunity to ask questions and all were answered. The patient agreed with the plan and demonstrated an understanding of the instructions.   The patient was advised to call back or seek an in-person evaluation if the symptoms worsen or if the condition fails to improve as anticipated.   Jodelle Green, FNP

## 2019-10-14 NOTE — Telephone Encounter (Signed)
No ans no vm to schedule sooner

## 2019-11-01 DIAGNOSIS — G35 Multiple sclerosis: Principal | ICD-10-CM

## 2019-11-09 ENCOUNTER — Encounter: Admit: 2019-11-09 | Discharge: 2019-11-09 | Payer: MEDICARE

## 2019-11-10 NOTE — Unmapped (Unsigned)
The [facility] has made a third and final attempt to reach this patient to refill the following medication:Tecfidera.      We have left voicemails on the following phone numbers: [phone].    Dates contacted: 11/5, 11/11, 11/16  Last scheduled delivery: 10/15    The patient Carducci be at risk of non-compliance with this medication. The patient should call the [facility] at [phone] (option 4) to refill medication.    [name]   [facility] Specialty Technician

## 2019-11-14 NOTE — Unmapped (Unsigned)
Upon investigation, one additional attempt for patient contact is warranted.  The [facility] will reach out to patient on 11/14/19.     Arnold Long  [facility] Specialty Pharmacist

## 2019-11-17 NOTE — Unmapped (Signed)
Neurology Clinical Pharmacist Telephone Call    Medication: Tecfidera    Received message from [facility] pharmacy that they have not been able to reach patient to set up refill of Tecfidera. Tried calling patient, no answer, LVM. This is the 4th attempt to reach patient.    Worthy Flank, PharmD, CPP  Clinical Pharmacist, [facility]  Phone: [phone]

## 2019-11-24 NOTE — Unmapped (Signed)
Neurology Clinical Pharmacist Telephone Call    Medication: Tecfidera    Received message from [facility] pharmacy that they have not been able to reach patient to set up refill of Tecfidera. Tried calling patient, no answer, LVM. This is the 5th attempt to reach patient.    Worthy Flank, PharmD, CPP  Clinical Pharmacist, [facility]  Phone: [phone]

## 2019-11-26 NOTE — Unmapped (Signed)
Neurology Clinical Pharmacist Telephone Call    Medication: Tecfidera    Called patient as 6th attempt in regards to Tecfidera refill. Patient answered, verbalized understanding that she needs to call the specialty pharmacy. Provided phone number and encouraged her to call ASAP.    Worthy Flank, PharmD, CPP  Clinical Pharmacist, [facility]  Phone: [phone]

## 2019-11-28 DIAGNOSIS — G35 Multiple sclerosis: Principal | ICD-10-CM

## 2019-11-28 NOTE — Unmapped (Addendum)
[facility] Refill Coordination Note    Specialty Medication(s) to be Shipped:   Neurology: Gilford Raid   **sent refill request**    Other medication(s) to be shipped: N/A     Jennifer Lewis, DOB: 10/03/1961  Phone: [phone] (home)       All above HIPAA information was verified with patient.     Was a Nurse, learning disability used for this call? No    Completed refill call assessment today to schedule patient's medication shipment from the [facility] [phone]).       Specialty medication(s) and dose(s) confirmed: Regimen is correct and unchanged.   Changes to medications: [name] reports no changes at this time.  Changes to insurance: No  Questions for the pharmacist: No    Confirmed patient received Welcome Packet with first shipment. The patient will receive a drug information handout for each medication shipped and additional FDA Medication Guides as required.       DISEASE/MEDICATION-SPECIFIC INFORMATION        N/A    SPECIALTY MEDICATION ADHERENCE     Medication Adherence    Patient reported X missed doses in the last month: 3  Specialty Medication: TECFIDERA 240 mg Cpdr (dimethyl fumarate)  Patient is on additional specialty medications: No  Adherence tools used: patient uses a pill box to manage medications   Other adherence tool: leaves medicine on dining table to take with meals           Tecfidera 240 mg: 7 days of medicine on hand     SHIPPING     Shipping address confirmed in Epic.     Delivery Scheduled: Yes, Expected medication delivery date: 12/04/2019.  However, Rx request for refills was sent to the provider as there are none remaining.     Medication will be delivered via Next Day Courier to the prescription address in Epic WAM.    Jennifer Lewis   [facility] Specialty Technician

## 2019-12-02 DIAGNOSIS — G35 Multiple sclerosis: Principal | ICD-10-CM

## 2019-12-02 NOTE — Unmapped (Signed)
Last Visit Date: 06/17/2019  Next Visit Date: 12/30/2019    No results found for: JCVIRUSAB, HBSAG, HEPBSAB, HBQT, HEPBCAB, HEPCAB, HIV     Results for orders placed in visit on 01/02/18   MRI Brain Wo Contrast    Narrative EXAM: Magnetic resonance imaging, brain, without contrast material.  DATE: 01/27/2018 1:57 PM  ACCESSION: [id number]  DICTATED: 01/27/2018 3:42 PM  INTERPRETATION LOCATION: [facility]    CLINICAL INDICATION: 58 years old Female with MULTIPLE SCLEROSIS-G35-Multiple sclerosis (CMS-HCC)      COMPARISON: MRI brain 08/22/2017    TECHNIQUE: Multiplanar, multisequence MR imaging of the brain was performed without I.V. contrast.    FINDINGS: Redemonstration of the multiple FLAIR hyperintensities in bilateral periventricular and subcortical white matter, which are unchanged. There is no new white matter lesion or increased enhancement. Also unchanged is right parietal calvarial hemangioma. There is no midline shift or mass lesion. There is no evidence of intracranial hemorrhage or acute infarct.  No diffusion weighted signal abnormality is identified.    A retention cyst in left maxillary sinus.    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         Impression Unchanged bilateral white matter hyperintensities, which are consistent with the known history of multiple sclerosis.       Results for orders placed in visit on 01/02/18   MRI Thoracic Spine Wo Contrast    Narrative EXAM: Magnetic resonance imaging, spinal canal and contents, thoracic, without contrast material.  DATE: 01/27/2018 1:57 PM  ACCESSION: [id number]  DICTATED: 01/27/2018 4:45 PM  INTERPRETATION LOCATION: [facility]    CLINICAL INDICATION: 58 years old Female with MULTIPLE SCLEROSIS-G35-Multiple sclerosis (CMS-HCC)      COMPARISON: None TECHNIQUE: Multiplanar MRI was performed through the thoracic spine without contrast administration.    FINDINGS:   Multilevel disc desiccation and disc space narrowing, including T5-T6, T8-T9, T9-T10, and the T10-T11. Mild disc bulging at T7-T8, and T8-T9, without significant spinal canal stenosis or neural foraminal narrowing. There is a left paracentral disc protrusion at T9-T10, and 10-T11, without significant spinal canal stenosis or neural foraminal narrowing.     There is no abnormal signal intensity in the spinal cord.         Impression - Multilevel mild degenerative changes of the thoracic spine.  -No abnormal signal intensity in the spinal cord.          Results for orders placed in visit on 01/02/18   MRI Cervical Spine Wo Contrast    Narrative EXAM: Magnetic resonance imaging, spinal canal and contents, cervical without contrast material.  DATE: 01/27/2018 1:57 PM  ACCESSION: [id number]  DICTATED: 01/27/2018 4:26 PM  INTERPRETATION LOCATION: [facility]    CLINICAL INDICATION: 58 years old Female with OTHER-G35-Multiple sclerosis (CMS-HCC)      COMPARISON: None    TECHNIQUE: Multiplanar multisequence MRI was performed through the cervical spine without intravenous contrast.    FINDINGS: The vertebral bodies are normally aligned.  The signal intensity from the vertebral body bone marrow and spinal cord is normal. There is disc desiccation and space narrowing in C4-C5, and C5-C6. There is no significant spinal canal or neural foraminal stenosis.    At C3-C4, there is a small central disc protrusion contributing to mild spinal canal stenosis. Mild left neural foraminal narrowing due to uncovertebral joint hypertrophy. No significant right neural foraminal narrowing.    At C4-C5, diffuse disc bulging,  disc osteophyte complex and bilateral uncovertebral joint hypertrophy, contributing to mild spinal canal stenosis, and mild right and severe left neural foraminal narrowing. At C5-C6, there is a diffuse disc bulging, asymmetric to right, and bilateral uncovertebral joint hypertrophy, contributing to mild spinal canal stenosis, and severe right and moderate left neural foraminal narrowing.    At C6-C7, bilateral uncovertebral joint hypertrophy contributing to mild the right and moderate left neural foraminal narrowing. No significant spinal canal stenosis.         Impression -- Multilevel degenerative changes over the cervical spine, most pronounced with severe left neural foraminal narrowing at the C4-C5 and severe right neural foraminal narrowing at C5-C6.  --No abnormal signal intensity in the spinal cord.

## 2019-12-03 MED ORDER — DIMETHYL FUMARATE 240 MG CAPSULE,DELAYED RELEASE
ORAL_CAPSULE | Freq: Two times a day (BID) | ORAL | 0 refills | 30 days | Status: CP
Start: 2019-12-03 — End: ?
  Filled 2019-12-03: qty 60, 30d supply, fill #0

## 2019-12-03 MED FILL — TECFIDERA 240 MG CAPSULE,DELAYED RELEASE: 30 days supply | Qty: 60 | Fill #0 | Status: AC

## 2019-12-05 ENCOUNTER — Encounter: Payer: Self-pay | Admitting: Family

## 2019-12-08 ENCOUNTER — Other Ambulatory Visit: Payer: Self-pay | Admitting: Family

## 2019-12-08 DIAGNOSIS — G35 Multiple sclerosis: Secondary | ICD-10-CM

## 2019-12-08 MED ORDER — ONDANSETRON 4 MG PO TBDP: 4.0000 mg | ORAL_TABLET | Freq: Four times a day (QID) | ORAL | 2 refills | Status: DC | PRN

## 2019-12-24 ENCOUNTER — Encounter: Payer: Self-pay | Admitting: Family

## 2019-12-24 ENCOUNTER — Other Ambulatory Visit: Payer: Self-pay | Admitting: Family

## 2019-12-25 ENCOUNTER — Other Ambulatory Visit: Payer: Self-pay

## 2019-12-25 MED ORDER — LISINOPRIL 10 MG PO TABS: 10.0000 mg | ORAL_TABLET | Freq: Every day | ORAL | 4 refills | Status: DC

## 2019-12-30 NOTE — Unmapped (Signed)
The second call made to the patient - the first call was made at 09:49 to conduct the visit that was scheduled at 09:30h.

## 2019-12-30 NOTE — Unmapped (Signed)
Tried to connect with the patient on both phone numbers to conduct the telemedicine visit, unable to reach, left a VM that I will try later on.  Jennifer Lewis

## 2020-01-05 NOTE — Unmapped (Signed)
Unable to reach.   This encounter was created in error - please disregard.

## 2020-01-05 NOTE — Unmapped (Signed)
[facility] Refill Coordination Note    Specialty Medication(s) to be Shipped:   Neurology: Tecfidera    Other medication(s) to be shipped: n/a     Jennifer Lewis, DOB: 31-Oct-1961  Phone: [phone] (home)       All above HIPAA information was verified with patient.     Was a Nurse, learning disability used for this call? No    Completed refill call assessment today to schedule patient's medication shipment from the [facility] [phone]).       Specialty medication(s) and dose(s) confirmed: Regimen is correct and unchanged.   Changes to medications: Jennifer Lewis reports no changes at this time.  Changes to insurance: No  Questions for the pharmacist: No    Confirmed patient received Welcome Packet with first shipment. The patient will receive a drug information handout for each medication shipped and additional FDA Medication Guides as required.       DISEASE/MEDICATION-SPECIFIC INFORMATION        N/A    SPECIALTY MEDICATION ADHERENCE     Medication Adherence    Patient reported X missed doses in the last month: 2  Specialty Medication: Tecfidera  Patient is on additional specialty medications: No  Patient is on more than two specialty medications: No  Any gaps in refill history greater than 2 weeks in the last 3 months: no  Demonstrates understanding of importance of adherence: yes  Informant: patient  Adherence tools used: patient uses a pill box to manage medications   Other adherence tool: leaves medicine on dining table to take with meals                 Tecfidera 240mg : Patient has 5 days of medication on hand      SHIPPING     Shipping address confirmed in Epic.     Delivery Scheduled: Yes, Expected medication delivery date: 01/09/20.  However, Rx request for refills was sent to the provider as there are none remaining.     Medication will be delivered via Next Day Courier to the prescription address in Epic WAM.    Jennifer Lewis [facility] Specialty [facility] has made a third and final attempt to reach this patient to refill the following medication:Tecfidera.      We have left voicemails on the following phone numbers: [phone].    Dates contacted: 12/30, 1/4, 1/11  Last scheduled delivery: 12/9    The patient Jennifer Lewis be at risk of non-compliance with this medication. The patient should call the [facility] at [phone] (option 4) to refill medication.    Jennifer Lewis   [facility] Specialty Technician

## 2020-01-06 ENCOUNTER — Other Ambulatory Visit: Payer: Self-pay

## 2020-01-06 ENCOUNTER — Encounter: Payer: Self-pay | Admitting: Cardiovascular Disease

## 2020-01-06 ENCOUNTER — Ambulatory Visit (INDEPENDENT_AMBULATORY_CARE_PROVIDER_SITE_OTHER): Payer: Medicare Other | Admitting: Cardiovascular Disease

## 2020-01-06 VITALS — BP 116/80 | HR 82 | Ht 66.0 in | Wt 166.8 lb

## 2020-01-06 DIAGNOSIS — G35 Multiple sclerosis: Principal | ICD-10-CM

## 2020-01-06 DIAGNOSIS — E785 Hyperlipidemia, unspecified: Secondary | ICD-10-CM | POA: Diagnosis not present

## 2020-01-06 DIAGNOSIS — I700 Atherosclerosis of aorta: Secondary | ICD-10-CM

## 2020-01-06 DIAGNOSIS — Z720 Tobacco use: Secondary | ICD-10-CM

## 2020-01-06 DIAGNOSIS — I10 Essential (primary) hypertension: Secondary | ICD-10-CM

## 2020-01-06 DIAGNOSIS — I272 Pulmonary hypertension, unspecified: Secondary | ICD-10-CM | POA: Diagnosis not present

## 2020-01-06 MED ORDER — DIMETHYL FUMARATE 240 MG CAPSULE,DELAYED RELEASE
ORAL_CAPSULE | Freq: Two times a day (BID) | ORAL | 3 refills | 30 days | Status: CP
Start: 2020-01-06 — End: ?
  Filled 2020-01-08: qty 60, 30d supply, fill #0

## 2020-01-06 NOTE — Progress Notes (Signed)
Cardiology Office Note   Date:  01/06/2020   ID:  Debra Hogan, DOB 09/15/1961, MRN [id number]  PCP:  Burnard Hawthorne, FNP  Cardiologist:   Kathlyn Sacramento, MD   Chief Complaint  Patient presents with  . other    6 month f/u no complaints today. Meds reviewed verbally with pt.      History of Present Illness: Debra Hogan is a 59 y.o. female who is here today for a follow-up regarding chest pain and pulmonary hypertension.  The patient has chronic medical conditions that include fibromyalgia, multiple sclerosis, tobacco use, COPD, degenerative disc disease with chronic back pain and hyperlipidemia.  She has family history of coronary artery disease and she is a smoker.  She is on home oxygen at night. She had CT scan of the lungs in [place] 2019 which shows small pericardial effusion.   She had Lexiscan Myoview last year which showed no evidence of ischemia with normal ejection fraction. Echocardiogram showed normal LV systolic function with grade 1 diastolic dysfunction, normal RV systolic function and moderately elevated pulmonary pressure with estimated systolic pressure of 49 mmHg.  It was felt that her pulmonary hypertension was likely due to underlying lung disease. She is on 2 L/min of oxygen therapy.   She was seen in October for worsening chest pain and shortness of breath.  A right and left cardiac catheterization was done which showed no significant obstructive coronary artery disease.  Vasospasm was noted in the ostial RCA which improved with intracoronary nitroglycerin.  Ejection fraction was normal.  Right heart catheterization revealed only mild pulmonary hypertension with normal cardiac output.  She was started on low-dose diltiazem and was given sublingual nitroglycerin to be used as needed.  She has been doing reasonably well.  She had intermittent episodes of mild chest pain but did not require using sublingual nitroglycerin.  She continues to smoke 1 pack/day.   Past  Medical History:  Diagnosis Date  . Anginal pain ([place])   . Arthritis   . COPD (chronic obstructive pulmonary disease) ([place])   . Depression   . Dyspnea   . Fibromyalgia   . Headache   . Heat stroke 2006  . Irritable bowel syndrome   . MS (multiple sclerosis) ([place])    Followed by Dr. Manuella Ghazi    Past Surgical History:  Procedure Laterality Date  . ABDOMINAL HYSTERECTOMY     CERVIX intact  . CHOLECYSTECTOMY N/A 01/03/2016   Procedure: LAPAROSCOPIC CHOLECYSTECTOMY WITH INTRAOPERATIVE CHOLANGIOGRAM;  Surgeon: Florene Glen, MD;  Location: [facility] ORS;  Service: General;  Laterality: N/A;  . ENDOSCOPIC RETROGRADE CHOLANGIOPANCREATOGRAPHY (ERCP) WITH PROPOFOL N/A 01/04/2016   Procedure: ENDOSCOPIC RETROGRADE CHOLANGIOPANCREATOGRAPHY (ERCP) WITH PROPOFOL;  Surgeon: Lucilla Lame, MD;  Location: [facility] END[facility];  Service: Endoscopy;  Laterality: N/A;  . FOOT SURGERY  2001  . LAPAROSCOPIC ENDOMETRIOSIS FULGURATION    . RIGHT/LEFT HEART CATH AND CORONARY ANGIOGRAPHY Bilateral 09/29/2019   Procedure: RIGHT/LEFT HEART CATH AND CORONARY ANGIOGRAPHY;  Surgeon: Nelva Bush, MD;  Location: [facility];  Service: Cardiovascular;  Laterality: Bilateral;     Current Outpatient Medications  Medication Sig Dispense Refill  . acyclovir (ZOVIRAX) 400 MG tablet Take 1 tablet (400 mg total) by mouth daily. 90 tablet 3  . Albuterol Sulfate (PROAIR RESPICLICK) 505 (90 Base) MCG/ACT AEPB Inhale 90 mcg into the lungs every 6 (six) hours as needed (as needed for cough, wheezing). (Patient taking differently: Inhale 180 mcg into the lungs every 6 (six)  hours as needed (as needed for cough, wheezing). ) 1 each 2  . baclofen (LIORESAL) 10 MG tablet Take 1 tablet (10 mg total) by mouth 3 (three) times daily as needed for muscle spasms. 30 each 4  . carbamazepine (TEGRETOL) 200 MG tablet Take 200 mg by mouth 2 (two) times daily.    . cholecalciferol (VITAMIN D) 1000 units tablet Take 1,000 Units by mouth daily.     . diclofenac sodium (VOLTAREN) 1 % GEL Apply 4 g topically 4 (four) times daily. 1 Tube 3  . diltiazem (CARDIZEM CD) 120 MG 24 hr capsule Take 1 capsule (120 mg total) by mouth daily. 30 capsule 11  . Dimethyl Fumarate 240 MG CPDR Take 240 mg by mouth 2 (two) times daily.    Marland Kitchen escitalopram (LEXAPRO) 20 MG tablet Take 1 tablet (20 mg total) by mouth daily. 90 tablet 1  . Fluticasone-Salmeterol (ADVAIR DISKUS) 100-50 MCG/DOSE AEPB Inhale 1 puff into the lungs 2 (two) times daily as needed (for shortness of breath). 60 each 3  . Fluticasone-Salmeterol (ADVAIR DISKUS) 250-50 MCG/DOSE AEPB Inhale 1 puff into the lungs 2 (two) times daily. 1 each 3  . Lifitegrast (XIIDRA) 5 % SOLN Apply to eye 2 (two) times daily.    Marland Kitchen lisinopril (ZESTRIL) 10 MG tablet Take 1 tablet (10 mg total) by mouth daily. 90 tablet 4  . Multiple Vitamins-Minerals (CENTRUM SILVER ULTRA WOMENS) TABS Take 1 tablet by mouth daily.    . nitroGLYCERIN (NITROSTAT) 0.4 MG SL tablet Place 1 tablet (0.4 mg total) under the tongue every 5 (five) minutes as needed for chest pain. 25 tablet 3  . ondansetron (ZOFRAN-ODT) 4 MG disintegrating tablet Take 1 tablet (4 mg total) by mouth every 6 (six) hours as needed for nausea or vomiting. 20 tablet 2  . rosuvastatin (CRESTOR) 20 MG tablet Take 1 tablet (20 mg total) by mouth daily. 90 tablet 3  . traZODone (DESYREL) 50 MG tablet Take 0.5-1 tablets (25-50 mg total) by mouth at bedtime as needed for sleep. 30 tablet 3   No current facility-administered medications for this visit.    Allergies:   Valium [diazepam]    Social History:  The patient  reports that she has been smoking. She has a 43.00 pack-year smoking history. She has never used smokeless tobacco. She reports that she does not drink alcohol or use drugs.   Family History:  The patient's family history includes Arthritis in her mother; Bipolar disorder in her son; Cancer in her father, maternal grandfather, and paternal grandmother;  Heart disease in her brother; Hyperlipidemia in her mother; Hypertension in her mother; Schizophrenia in her son.    ROS:  Please see the history of present illness.   Otherwise, review of systems are positive for none.   All other systems are reviewed and negative.    PHYSICAL EXAM: VS:  BP 116/80 (BP Location: Left Arm, Patient Position: Sitting, Cuff Size: Normal)   Pulse 82   Ht 5\' 6"  (1.676 m)   Wt 166 lb 12 oz (75.6 kg)   BMI 26.91 kg/m  , BMI Body mass index is 26.91 kg/m. GEN: Well nourished, well developed, in no acute distress  HEENT: normal  Neck: no JVD, carotid bruits, or masses Cardiac: RRR; no murmurs, rubs, or gallops, trace bilateral leg edema Respiratory: Mildly diminished breath sounds bilaterally GI: soft, nontender, nondistended, + BS MS: no deformity or atrophy  Skin: warm and dry, no rash Neuro:  Strength and sensation are  intact Psych: euthymic mood, full affect Radial pulses normal bilaterally.  EKG:  EKG is ordered today. The ekg ordered today demonstrates normal sinus rhythm with no significant ST or T wave changes.   Recent Labs: 07/09/2019: TSH 5.40 08/20/2019: ALT 18 09/25/2019: BUN 12; Creatinine, Ser 0.66; Hemoglobin 14.8; Platelets 196; Potassium 4.6; Sodium 133    Lipid Panel    Component Value Date/Time   CHOL 203 (H) 07/09/2019 1405   TRIG 285.0 (H) 07/09/2019 1405   HDL 46.00 07/09/2019 1405   CHOLHDL 4 07/09/2019 1405   VLDL 57.0 (H) 07/09/2019 1405   LDLCALC 136 (H) 09/19/2013 1640   LDLDIRECT 108.0 07/09/2019 1405      Wt Readings from Last 3 Encounters:  01/06/20 166 lb 12 oz (75.6 kg)  09/29/19 165 lb (74.8 kg)  09/25/19 167 lb 8 oz (76 kg)       PAD Screen 11/28/2018  Previous PAD dx? Yes  Previous surgical procedure? No  Pain with walking? Yes  Subsides with rest? No  Feet/toe relief with dangling? Yes  Painful, non-healing ulcers? No  Extremities discolored? No      ASSESSMENT AND PLAN:   1.  Atypical  chest pain: Possible coronary spasm currently on diltiazem and sublingual nitroglycerin to be used as needed.  Ostial RCA spasm noted during cardiac catheterization with likely catheter induced.  2.   pulmonary hypertension: This was mild on right heart catheterization in October.  Mean pulmonary pressure was 27 mmHg with pulmonary capillary wedge pressure of 14 mmHg.   3.  Tobacco use: I discussed the importance of smoking cessation.  She reports inability to quit.  4.  Essential hypertension: Blood pressures controlled on lisinopril.  5.  Hyperlipidemia: Currently on rosuvastatin 20 mg daily.    Disposition:   FU with me in 6 months.  Signed,  Lorine Bears, MD  01/06/2020 4:38 PM    [facility]

## 2020-01-06 NOTE — Patient Instructions (Signed)
Medication Instructions:  Your physician recommends that you continue on your current medications as directed. Please refer to the Current Medication list given to you today.  *If you need a refill on your cardiac medications before your next appointment, please call your pharmacy*  Lab Work: None ordered If you have labs (blood work) drawn today and your tests are completely normal, you will receive your results only by: . MyChart Message (if you have MyChart) OR . A paper copy in the mail If you have any lab test that is abnormal or we need to change your treatment, we will call you to review the results.  Testing/Procedures: None ordered  Follow-Up: At [facility], you and your health needs are our priority.  As part of our continuing mission to provide you with exceptional heart care, we have created designated Provider Care Teams.  These Care Teams include your primary Cardiologist (physician) and Advanced Practice Providers (APPs -  Physician Assistants and Nurse Practitioners) who all work together to provide you with the care you need, when you need it.  Your next appointment:   6 month(s)  The format for your next appointment:   In Person  Provider:    You Lehenbauer see Dr. Arida or one of the following Advanced Practice Providers on your designated Care Team:    Christopher Berge, NP  Ryan Dunn, PA-C  Jacquelyn Visser, PA-C   Other Instructions N/A  

## 2020-01-06 NOTE — Unmapped (Signed)
Request received via interface.     Provider: Dr. Johnnye Lana    Last Visit Date: 12/30/2019    Next Visit Date: Visit date not found, recall 01/25/2020    No results found for: JCVIRUSAB, HBSAG, HEPBSAB, HBQT, HEPBCAB, HEPCAB, HIV     Results for orders placed in visit on 01/02/18   MRI Brain Wo Contrast    Narrative EXAM: Magnetic resonance imaging, brain, without contrast material.  DATE: 01/27/2018 1:57 PM  ACCESSION: [id number]  DICTATED: 01/27/2018 3:42 PM  INTERPRETATION LOCATION: [facility]    CLINICAL INDICATION: 59 years old Female with MULTIPLE SCLEROSIS-G35-Multiple sclerosis (CMS-HCC)      COMPARISON: MRI brain 08/22/2017    TECHNIQUE: Multiplanar, multisequence MR imaging of the brain was performed without I.V. contrast.    FINDINGS: Redemonstration of the multiple FLAIR hyperintensities in bilateral periventricular and subcortical white matter, which are unchanged. There is no new white matter lesion or increased enhancement. Also unchanged is right parietal calvarial hemangioma. There is no midline shift or mass lesion. There is no evidence of intracranial hemorrhage or acute infarct.  No diffusion weighted signal abnormality is identified.    A retention cyst in left maxillary sinus.    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         Impression Unchanged bilateral white matter hyperintensities, which are consistent with the known history of multiple sclerosis.       Results for orders placed in visit on 01/02/18   MRI Thoracic Spine Wo Contrast    Narrative EXAM: Magnetic resonance imaging, spinal canal and contents, thoracic, without contrast material.  DATE: 01/27/2018 1:57 PM  ACCESSION: [id number] UN  DICTATED: 01/27/2018 4:45 PM  INTERPRETATION LOCATION: [facility]    CLINICAL INDICATION: 59 years old Female with MULTIPLE SCLEROSIS-G35-Multiple sclerosis (CMS-HCC)      COMPARISON: None TECHNIQUE: Multiplanar MRI was performed through the thoracic spine without contrast administration.    FINDINGS:   Multilevel disc desiccation and disc space narrowing, including T5-T6, T8-T9, T9-T10, and the T10-T11. Mild disc bulging at T7-T8, and T8-T9, without significant spinal canal stenosis or neural foraminal narrowing. There is a left paracentral disc protrusion at T9-T10, and 10-T11, without significant spinal canal stenosis or neural foraminal narrowing.     There is no abnormal signal intensity in the spinal cord.         Impression - Multilevel mild degenerative changes of the thoracic spine.  -No abnormal signal intensity in the spinal cord.          Results for orders placed in visit on 01/02/18   MRI Cervical Spine Wo Contrast    Narrative EXAM: Magnetic resonance imaging, spinal canal and contents, cervical without contrast material.  DATE: 01/27/2018 1:57 PM  ACCESSION: [id number] UN  DICTATED: 01/27/2018 4:26 PM  INTERPRETATION LOCATION: [facility]    CLINICAL INDICATION: 58 years old Female with OTHER-G35-Multiple sclerosis (CMS-HCC)      COMPARISON: None    TECHNIQUE: Multiplanar multisequence MRI was performed through the cervical spine without intravenous contrast.    FINDINGS: The vertebral bodies are normally aligned.  The signal intensity from the vertebral body bone marrow and spinal cord is normal. There is disc desiccation and space narrowing in C4-C5, and C5-C6. There is no significant spinal canal or neural foraminal stenosis.    At C3-C4, there is a small central disc protrusion contributing to mild spinal canal stenosis. Mild left neural  foraminal narrowing due to uncovertebral joint hypertrophy. No significant right neural foraminal narrowing.    At C4-C5, diffuse disc bulging, disc osteophyte complex and bilateral uncovertebral joint hypertrophy, contributing to mild spinal canal stenosis, and mild right and severe left neural foraminal narrowing. At C5-C6, there is a diffuse disc bulging, asymmetric to right, and bilateral uncovertebral joint hypertrophy, contributing to mild spinal canal stenosis, and severe right and moderate left neural foraminal narrowing.    At C6-C7, bilateral uncovertebral joint hypertrophy contributing to mild the right and moderate left neural foraminal narrowing. No significant spinal canal stenosis.         Impression -- Multilevel degenerative changes over the cervical spine, most pronounced with severe left neural foraminal narrowing at the C4-C5 and severe right neural foraminal narrowing at C5-C6.  --No abnormal signal intensity in the spinal cord.

## 2020-01-07 DIAGNOSIS — G35 Multiple sclerosis: Principal | ICD-10-CM

## 2020-01-07 NOTE — Unmapped (Signed)
Per test claim for [employer] at the [facility], patient needs Medication Assistance Program for High Copay.

## 2020-01-08 MED FILL — TECFIDERA 240 MG CAPSULE,DELAYED RELEASE: 30 days supply | Qty: 60 | Fill #0 | Status: AC

## 2020-01-15 ENCOUNTER — Encounter: Payer: Self-pay | Admitting: Family

## 2020-01-16 NOTE — Unmapped (Signed)
Ok, I will do it. She is already scheduled with me.

## 2020-01-19 ENCOUNTER — Telehealth: Payer: Self-pay | Admitting: Lab

## 2020-01-19 ENCOUNTER — Telehealth: Payer: Self-pay | Admitting: Family

## 2020-01-19 NOTE — Telephone Encounter (Signed)
Letter printed and mailed. Called Pt to tell her letter was mailed out No answer, I was able to leave a VM. 

## 2020-01-19 NOTE — Telephone Encounter (Signed)
close

## 2020-01-19 NOTE — Telephone Encounter (Signed)
Letter printed and mailed. Called Pt to tell her letter was mailed out No answer, I was able to leave a VM.

## 2020-01-27 ENCOUNTER — Telehealth: Admit: 2020-01-27 | Discharge: 2020-01-28 | Payer: MEDICARE | Attending: Neurology | Primary: Neurology

## 2020-01-27 DIAGNOSIS — G8929 Other chronic pain: Secondary | ICD-10-CM | POA: Diagnosis not present

## 2020-01-27 DIAGNOSIS — G35 Multiple sclerosis: Secondary | ICD-10-CM | POA: Diagnosis not present

## 2020-01-27 DIAGNOSIS — M792 Neuralgia and neuritis, unspecified: Secondary | ICD-10-CM | POA: Diagnosis not present

## 2020-01-27 NOTE — Unmapped (Signed)
[facility]  Multiple Sclerosis/Neuroimmunology Division  Sarita Bottom, MD  Associate Professor of Neurology    DATE OF VISIT 01/27/2020    Re:  Jennifer Lewis  [address]  [place] [address]  MRN: [id number]  DOB: [birth year]      Visit: Patient visit was performed over the  phone in the setting of  COVID-19 Pandemic.     DATE OF LAST VISIT: 06/17/2019    Assessment:     1. I introduced myself and confirmed the identity of the patient.  2. took a detailed interval history of the present illness fromMs. Jennifer Lewis , details on past medical history, family history and social history.  3. I personally reviewed  patient's interval medical records, interval MRI images  and have discussed  MRI findings with the patient.   4. I performed medication reconciliation  5.  The diagnostic and treatment plan were discussed with  [name] , who agreed with the recommended diagnostic and treatment plan                                                                                                                                                                                                                                     ?? Multiple sclerosis:  [name], is a 59 y.o. Caucasian  female, who has been diagnosed with RRMS. She was on Tecfidera since 2015 and stopped in Nov/2017 due to financial issues. She re-started Tecfidera in [place] 2018 and tolerates it well. She as last time seen on  01/02/2018 and performed MRI studies in 01/2019 - brain MRI showed a stable lesion pattern compared with August 2018, with no lesions on the C and T spinal cord. She reports a progressive fatigability since that time. We are considering treatment switch due to this progression, but I would prefer to see the patient in the office to perform an in-person exam first. She has been relapse-free since 06/17/2019 (telemedicine visit).  Please see the detailed plan below.     ?? Intracranial aneurysms:  Follow-up with neurosurgery. Aneurysms could contribute to headaches.     Plan:     1.  Brain, C/T spine MRI with and w/o contrast: ordered but not scheduled yet.   2. Continue Baclofen as needed for muscle spasms: increase to 20 mg nightly.  3.  Continue  VitamiN D 2000 IU/day  4. Continue Tecfidera 240 mg BID  5. CBC/diff, CMP, Vitamin D 25OH: with the PCP  6.  Continue treatment per neurosurgery  7. Phone visit with me in 3 months, sooner if needed.  OV when patient is able.     Subjective:     HISTORY OF PRESENT ILLNESS:   [name], is a  59 y.o. Caucasian  female, who has been diagnosed with RRMS. She was treated since 2011 (per notes) for fibromyalgia. Her fist MS symptom seems to be pain in her legs in summer 2015.  MRI was done that showed lesions. Has not received steroids. She was on Lyrica that stopped in 2016 because of financial reasons. Had weight gain with Lyrica, too. She was on Tecfidera since 2015 and stopped in Nov/2017 due to financial issues. She tolerated Tecfidera well. Takes Baclofen PRN, for spasms, if she needs to walk for a quarter of a mile , she usees a cane, gets fatigued after about 200 feet. Fatigue is present since 2010.  Did not take any medications for fatigue.  Has bladder urgency, takes Ditropan that helps.  She is sometimes constipated. Has bilateral paroxysmal leg spasms when she lies down since 2011. She restarted with Tecfidera in [place] 2018, tolerates well. No flushings.    Please se my last  Clinic note dated 01/02/2018  for more details on the HPI.      MS DMD history:  Tecfidera: 2015 and stopped in Nov/2017 due to financial issues  Tecfidera: re-started [place] 2018-ongoing    INTERVAL HISTORY SINCE THE LAST VISIT ON 06/17/2019:  No relapses, but progression of her symptoms.     She is having itching , it does not happen every day, 2-3 times a week, like itching all over.  She does not think that has relation to [employer].  Walking is more difficult.She is walking with a rollator because of lung problems (emphysema). Uses inhaler.   Overall functionality has changed by 30 % since last year, she is more fatigued now.     Vision: blurry on both eyes. That comes and goes, pain in eyes, both, that comes on its own, She is seeing the eye doctor who put her on xydra for dry eye that was helping, the pain in the eye like shooting pain/   Bladder control: Can not hold it. No infection. Not sure if she empties completely. When she drinks sweet tea she  Foucher leak. Not constipated. Does not want to try on bladder medications.   Bowel control: good  Vision: fluctuates, has eye pain and hen that happens things get blurry, she is light sensitive. Like a piercing pain behind the eye balls. Optometrist suggested MRI of the eyes. Vision problems comes and goes. She thinks eye pressure was measured, but is not sure. Mother has glaucoma.   Balance is not the best.   Pain: She feels like she is having more pain , pain in the eyes and Pain: Pain is in arms, collarbone,s legs, feet, back.  Sides . Back pain eases off when she lays down. Pain level 7/10. The most hurting part are legs and muscle spasms.     .  She did not have any weight loss.   Once had a fall and several times almost had a fall several in a tub, got a shower seat.     Emphysema is restricting walk. Uses a rollator more often. 20 min with a  rollator prior to resting.   Weakness: legs Enriquez give away  Balance: within the last 3 months had 2 falls, did not pas out and no trauma.   She is not remembering.      She was going to the pain clinic a few years ago. She would like to switch doctors. Pain clinic referral: Dr Gwenyth Bender at [facility].   She run out of carbamazepine a few months ago and did not see a change in pain after running out.    Takes Vitamin D 2000 U.day  She does not want to be put on lyrica because of the weight gain.  Carbamazepine did not not  help; made her constipated. Cymbalta was giving her headaches.   Takes Baclofen at bedtime takes about every night.     ............................................................................................................................................Marland Kitchen  DIAGNOSTIC STUDIES / REVIEW OF INTERVAL MEDICAL RECORDS:    MRI:  05/29/2015: Brain MRI with and w/o contrast, report: Multiple white matter lesions in a distribution consistent with multiple sclerosis. No enhancing lesions.:   02/17/2017: Brain MRI with and w/o contrast, report:Stable burden of the disease with multiple white matter lesions as detailed above. No new enhancing lesions are visualized. There is a 8 mm bilobed aneurysm of the paraclinoid left internal carotid artery that has been present since 2015 comparison.  04/01/2017: MRA head with and w/o contrast: Unchanged appearance of bilateral ICA aneurysms as above. COMPARISON: MRI brain from 02/17/2017.  08/22/2017: Brain MRI with and w/o contrast and brain MRA report (COMPARISON: Brain MRI 02/17/2017. Brain MRA 04/01/2017. Cerebral angiogram 05/17/2017): -Interval left ICA pipeline stent placement. Evolving thrombus within the occluded left ophthalmic artery aneurysm. No evidence of flow within the aneurysm. -Unchanged right cavernous internal carotid artery aneurysm measuring 4 mm. -Numerous T2/flair hyperintensities within the subcortical and periventricular white matter are consistent with the known history of multiple sclerosis. There is a single new lesion within the left periventricular white matter when compared to the most recent MRI 02/17/2017.  01/27/2018 : Brain MRI w/o contrast, report (COMPARISON: MRI brain 08/22/2017); Unchanged bilateral white matter hyperintensities, which are consistent with the known history of multiple sclerosis.  01/27/2018 : Cervical spine MRI w/o contrast, report: -- Multilevel degenerative changes over the cervical spine, most pronounced with severe left neural foraminal narrowing at the C4-C5 and severe right neural foraminal narrowing at C5-C6.  --No abnormal signal intensity in the spinal cord.     01/27/2018 : Thoracic spine MRI w/o contrast, report: - Multilevel mild degenerative changes of the thoracic spine. -No abnormal signal intensity in the spinal cord.    Lumbar puncture:   Per patient report, she did the spinal tap and the results were  the confirmation for MS     Blood tests:   Last:  09/25/2019: Sodium 140mmol/ L. CBC: WNL  08/20/2019: ALT?ALT: WNL      Office Visit on 05/08/2017   Component Date Value Ref Range Status   ??? Angio Convert Enzyme 05/08/2017 47  8 - 53 U/L Final   ??? Antinuclear Antibodies (ANA) 05/08/2017 Positive* Negative Final   ??? Pattern 1 (ANA) 05/08/2017 Speckled   Final   ??? ANA Titer 1 05/08/2017 1:80   Final   ??? Pattern 2 (ANA) 05/08/2017 Homogenous   Final   ??? ANA Titer 2 05/08/2017 1:80   Final   ??? Vitamin D Total (25OH) 05/08/2017 38.7  20.0 - 80.0 ng/mL Final     05/10/15: RF, vitamin B12; WNL.    06/26/16:  TSH 5.96 (ref. 0.600 - 3.300 uIU/mL).       Other:   04/27/2017: seen Dr Judie Petit. Wang for the evaluation of cerebral aneurysms.  05/01/2017: Cerebral angiogram: 1. Superiorly projecting 7 x 6 mm left ophthalmic aneurysm. 2. Inferiorly projecting 5 x 3 right posterior communicating aneurysm. 3. 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  12/28/2017: IR CEREBRAL ARTERIOGRAM: Left internal carotid artery injection demonstrates good opacification of the left middle and anterior cerebral arteries. The pipeline treated left ophthalmic aneurysm is completely obliterated. No stenosis within the pipeline treated segment of the left internal carotid artery.Right internal carotid artery injection demonstrates an unchanged 4 mm aneurysm arising from the communicating segment of the right internal carotid artery.Left vertebral artery injection demonstrates unchanged arteriovenous shunting adjacent to the right side of the tentorium with arterial supply from the right posterior cerebral artery and venous drainage to the right transverse sinus.  ............................................................................................................................................        Past Medical History:  Past Medical History:   Diagnosis Date   ??? Aneurysm (CMS-HCC)    ??? Anxiety    ??? COPD (chronic obstructive pulmonary disease) (CMS-HCC)    ??? Depression    ??? Fibromyalgia    ??? Fibromyalgia, primary 2011   ??? Headache, tension-type 2011    Worse and more frequent in the past 4-5 months.   ??? MS (multiple sclerosis) (CMS-HCC)    ??? Restless leg syndrome    ??? Sleep apnea, obstructive 2016    Sleep study done.  Used a CPAP.   ??? Stroke (CMS-HCC) 2005       ALLERGIES:  No Known Allergies    CURRENT MEDICATIONS:    Current Outpatient Medications   Medication Sig Dispense Refill   ??? acyclovir (ZOVIRAX) 400 MG tablet Take 400 mg by mouth nightly.      ??? albuterol sulfate (PROAIR RESPICLICK) 90 mcg/actuation AePB Inhale 180 mcg Every four (4) hours.     ??? baclofen (LIORESAL) 10 MG tablet Take 1 tablet (10 mg total) by mouth Three (3) times a day as needed for muscle spasms. 30 tablet 5   ??? cholecalciferol, vitamin D3, (VITAMIN D3) 1,000 unit capsule Take 1,000 Units by mouth daily.     ??? dimethyl fumarate (TECFIDERA) 240 mg CpDR Take 1 capsule (240mg ) by mouth twice daily with meals. 60 capsule 3   ??? escitalopram oxalate (LEXAPRO) 10 MG tablet Take 10 mg by mouth daily.      ??? fluticasone-salmeterol (ADVAIR) 100-50 mcg/dose diskus Inhale 1 puff Two (2) times a day.     ??? lifitegrast (XIIDRA) 5 % Dpet Apply 1 drop to eye Two (2) times a day.     ??? lisinopril (PRINIVIL,ZESTRIL) 10 MG tablet Take 1 tablet (10 mg total) by mouth daily. 30 tablet 0   ??? multivit,iron,minerals/lutein (CENTRUM SILVER ULTRA WOMEN'S ORAL) Take 1 tablet by mouth daily.     ??? naltrexone, bulk, 100 % Powd Take 1.5 mg at bedtime for 2 weeks, then 3 mg at bedtime for 2 weeks, then 4.5 mg at bedtime and continue. CAPSULES. 0.135 g 5   ??? ondansetron (ZOFRAN-ODT) 4 MG disintegrating tablet Take 4 mg by mouth every six (6) hours as needed for nausea.     ??? rosuvastatin (CRESTOR) 10 MG tablet Take 10 mg by mouth daily.       No current facility-administered medications for this visit.            Past Surgical History:  Past Surgical History:   Procedure Laterality Date   ??? CHOLECYSTECTOMY     ??? colonoscopy     ??? HYSTERECTOMY     ??? IR EMBOLIZATION INTRACRANIAL SPINAL  05/17/2017    IR EMBOLIZATION INTRACRANIAL SPINAL 05/17/2017 Altamease Oiler, MD [facility]   ??? IR EMBOLIZATION INTRACRANIAL SPINAL  07/09/2018    IR EMBOLIZATION INTRACRANIAL SPINAL 07/09/2018 Altamease Oiler, MD IMG [facility]IR [facility]       Social History:    Social History     Socioeconomic History   ??? Marital status: Married     Spouse name: Not on file   ??? Number of children: Not on file   ??? Years of education: Not on file   ??? Highest education level: Not on file   Occupational History   ??? Not on file   Social Needs   ??? Financial resource strain: Not on file   ??? Food insecurity     Worry: Not on file     Inability: Not on file   ??? Transportation needs     Medical: Not on file     Non-medical: Not on file   Tobacco Use   ??? Smoking status: Current Every Day Smoker     Packs/day: 1.00     Years: 36.00     Pack years: 36.00     Types: Cigarettes   ??? Smokeless tobacco: Never Used   ??? Tobacco comment: Has rx prescription for Chantix, not using yet   Substance and Sexual Activity   ??? Alcohol use: No     Alcohol/week: 0.0 standard drinks   ??? Drug use: No   ??? Sexual activity: Yes     Partners: Male     Comment: Not on a regular basis, due to health issues.   Lifestyle   ??? Physical activity     Days per week: Not on file     Minutes per session: Not on file   ??? Stress: Not on file   Relationships   ??? Social Wellsite geologist on phone: Not on file     Gets together: Not on file     Attends religious service: Not on file Active member of club or organization: Not on file     Attends meetings of clubs or organizations: Not on file     Relationship status: Not on file   Other Topics Concern   ??? Exercise Not Asked   ??? Living Situation Not Asked   Social History Narrative   ??? Not on file       Family History:    Family History   Problem Relation Age of Onset   ??? Cancer Father    ??? Hypertension Brother    ??? Heart disease Brother    ??? Cancer Maternal Grandfather    ??? Cancer Paternal Grandfather    ??? Cancer Paternal Uncle    ??? COPD Brother    ??? Heart attack Brother    ??? Stroke Mother    ??? Stroke Maternal Grandmother    ??? Thyroid disease Maternal Grandmother    ??? Multiple sclerosis Neg Hx    ??? Aneurysm Neg Hx         Review of Systems:  A 10-systems review was performed over the phone and, unless otherwise noted, declared negative by patient.  ..........................................................................................................................................Marland Kitchen    VISIT SUMMARY:  Patient visit was performed over the phone in the setting of  COVID-19 Pandemic. Patient preferred phone visit.     I spent 37 minutes on the phone with the patient. I spent an additional 5 minutes on pre- and post-visit activities.   The patient was not located and I was located within 250 yards of a hospital based location during the phone visit. The patient was physically located in [place] or a state in which I am permitted to provide care. The patient and/or parent/guardian understood that s/he Gilbert incur co-pays and cost sharing, and agreed to the telemedicine visit. The visit was reasonable and appropriate under the circumstances given the patient's presentation at the time.  The patient and/or parent/guardian has been advised of the potential risks and limitations of this mode of treatment (including, but not limited to, the absence of in-person examination) and has agreed to be treated using telemedicine. The patient's/patient's family's questions regarding telemedicine have been answered.  If the visit was completed in an ambulatory setting, the patient and/or parent/guardian has also been advised to contact their provider???s office for worsening conditions, and seek emergency medical treatment and/or call 911 if the patient deems either necessary.

## 2020-01-31 DIAGNOSIS — M792 Neuralgia and neuritis, unspecified: Principal | ICD-10-CM

## 2020-01-31 DIAGNOSIS — G8929 Other chronic pain: Principal | ICD-10-CM

## 2020-01-31 MED ORDER — NALTREXONE (BULK) 100 % POWDER
5 refills | 0.00000 days | Status: CP
Start: 2020-01-31 — End: 2020-01-31

## 2020-01-31 MED ORDER — NALTREXONE (BULK) 100 % POWDER: g | 5 refills | 0 days | Status: AC

## 2020-02-01 NOTE — Unmapped (Signed)
Patient agreed with LDN.    Rx placed.     Jennifer Lewis

## 2020-02-02 DIAGNOSIS — G8929 Other chronic pain: Principal | ICD-10-CM

## 2020-02-02 DIAGNOSIS — M792 Neuralgia and neuritis, unspecified: Principal | ICD-10-CM

## 2020-02-02 NOTE — Unmapped (Signed)
Pharmacy staff from [facility] called and requested a new script for Naltrexone.  It was sent in as a powder instead of tabs/capsules.  Please see pended order.

## 2020-02-03 NOTE — Unmapped (Signed)
Called [facility] and cancelled Naltrexone order as requested by Dr. Johnnye Lana.

## 2020-02-03 NOTE — Unmapped (Signed)
I re-sent it to compounding pharmacy they will put the powder into capsules.   This is low dose naltrexone that need to come from the compounding pharmacy.     Sheral Flow, please call [facility] pharmacy to cancel this prescription with them.   Thanks, Sharmon Revere

## 2020-02-04 ENCOUNTER — Telehealth: Payer: Self-pay | Admitting: Cardiovascular Disease

## 2020-02-04 NOTE — Telephone Encounter (Signed)
Patient calling  States that her urologist is considering prescribing naltrexone Patient would like to know if this could interact with heart medications Please call to discuss

## 2020-02-04 NOTE — Telephone Encounter (Signed)
To Dr. Marcy Panning D to review.

## 2020-02-05 NOTE — Telephone Encounter (Signed)
I called the patient and advised her of pharmacy recommendations that there would not be a concern for any interactions with naltrexone and her cardiac medications.  The patient states that wasn't really her question. She was more concerned about the medication in relation to her heart condition.  I apologized to the patient and advised that was not how the message was stated. She is aware I will send to Dr. Kirke Corin for further review and we will call her back with MD recommendations.  The patient voices understanding. She states she has MS and Wich be laying down to rest- she asks that we leave a message on her voice mail.

## 2020-02-05 NOTE — Telephone Encounter (Signed)
DPR on file with ok to leave a detailed message. lmom with Dr. Jari Sportsman response.  No issue from a cardiac standpoint with her being prescribed Naltrexone.  Patient is to call back if any questions.

## 2020-02-05 NOTE — Telephone Encounter (Signed)
No issues with naltrexone

## 2020-02-05 NOTE — Telephone Encounter (Signed)
No issues from a cardiac standpoint.

## 2020-02-06 NOTE — Unmapped (Addendum)
?     In case of:  ? a suspected relapse (new symptoms or worsening existing symptoms, lasting for >24h)  OR  ? a need for an additional appointment for other reasons  OR   ? if you have other questions: please contact:      [facility] Desk  Phone: [phone]     ? If you have questions for our  pharmacist, please call:      Worthy Flank, PharmD, CPP  Phone: [phone]     ? If you need financial assistance, please call:      Financial Assistance Unit      Phone: [phone]            It was a pleasure to see you today!    Sarita Bottom, MD  Clinical Associate Professor of Neurology  [facility], Department of Neurology  Multiple Sclerosis/Neuroimmunology Division  [address] [place], [place] [address]  Phone: [phone]   Fax: [phone]    [facility] is a specialty clinic and there is a need for you to have a  primary care provider  who will take care of your non-neurological health.   ......................................................................................................................    COVID vaccine guidance in patients with MS and related diseases:  [email or web address]  ....................................................................................................................Marland Kitchen

## 2020-02-06 NOTE — Unmapped (Signed)
Patient is currently off medication due to itching. Requests followup in 2 weeks.

## 2020-02-10 DIAGNOSIS — G35 Multiple sclerosis: Principal | ICD-10-CM

## 2020-02-10 NOTE — Unmapped (Signed)
Update: Patient reports brief loss of vision left eye.  Referral sent to Neuro Ophthalmology and reminded patient to obtain MRI's.

## 2020-02-19 DIAGNOSIS — J449 Chronic obstructive pulmonary disease, unspecified: Secondary | ICD-10-CM | POA: Diagnosis not present

## 2020-02-19 NOTE — Unmapped (Deleted)
Jennifer Lewis is a 59 y.o. female  with *** who presents for evaluation of ***.    CC: ***     Timeline:   ***     Past Medical History:  ***    Previous images:  *** (DATE, Reading provider):      Today's Exam:  ***  Images (February 19, 2020):    Assessment and plan:   - *** in a patient with ***. Today in the office there are no neuroophthalmologic issues. All exams are within normal limits.   - Follow up in 1 year or PRN***  - I have spent more than 50% of the provider's face-to-face visit time with a patient is spent in counseling or coordination of care.  - I was present and involved in all parts of the service and the documentation is mine.   - I instructed patient to call [facility] at [phone] for questions/concerns or report immediately to the Emergency department.  - CC referring provider

## 2020-03-04 ENCOUNTER — Encounter: Payer: Self-pay | Admitting: Family

## 2020-03-05 ENCOUNTER — Telehealth: Payer: Self-pay

## 2020-03-05 NOTE — Telephone Encounter (Signed)
I called patient after reading mychart message & she is scheduled to discuss with [facility] Monday @ 2p.

## 2020-03-08 ENCOUNTER — Encounter: Payer: Self-pay | Admitting: Family

## 2020-03-08 ENCOUNTER — Ambulatory Visit (INDEPENDENT_AMBULATORY_CARE_PROVIDER_SITE_OTHER): Payer: Medicare Other | Admitting: Family

## 2020-03-08 ENCOUNTER — Other Ambulatory Visit: Payer: Self-pay

## 2020-03-08 ENCOUNTER — Ambulatory Visit (INDEPENDENT_AMBULATORY_CARE_PROVIDER_SITE_OTHER): Payer: Medicare Other

## 2020-03-08 VITALS — BP 122/78 | HR 71 | Temp 96.6°F | Ht 65.0 in | Wt 168.8 lb

## 2020-03-08 DIAGNOSIS — M545 Low back pain, unspecified: Secondary | ICD-10-CM

## 2020-03-08 DIAGNOSIS — M160 Bilateral primary osteoarthritis of hip: Secondary | ICD-10-CM | POA: Diagnosis not present

## 2020-03-08 DIAGNOSIS — I10 Essential (primary) hypertension: Secondary | ICD-10-CM | POA: Diagnosis not present

## 2020-03-08 DIAGNOSIS — M47816 Spondylosis without myelopathy or radiculopathy, lumbar region: Secondary | ICD-10-CM | POA: Diagnosis not present

## 2020-03-08 DIAGNOSIS — F411 Generalized anxiety disorder: Secondary | ICD-10-CM | POA: Diagnosis not present

## 2020-03-08 MED ORDER — GABAPENTIN 100 MG PO CAPS: 100.0000 mg | ORAL_CAPSULE | Freq: Three times a day (TID) | ORAL | 3 refills | Status: DC

## 2020-03-08 NOTE — Patient Instructions (Signed)
Start with gabapentin 3 times a day; we [place] need to increase this particularly at bedtime. Referral to Dr. Levi Aland at [facility] .  Please give [place] a call if you receive a call in regards to this. Follow-up in 2 months.

## 2020-03-08 NOTE — Progress Notes (Signed)
Subjective:    Patient ID: Debra Hogan, female    DOB: [birth year], 59 y.o.   MRN: [id number]  CC: Debra Hogan is a 59 y.o. female who presents today for follow up.   HPI: CC: low back midline and left posterior pain coming left buttocks, several months, worsening over the past month.   Most painful on left hip, left buttuck and left leg. Cannot sleep on left side.  Had an episode of left lateral side of foot went numb. Otherwise numbness in groin area or back, legs.. No trouble with having bowel movement or urinary. No h/o cancer. Takes advil, baclofen, resting with relief.  Worse with activity.   Suspects that she has aggrevated back pain carrying fire wood inside .  NO falls.    Compliant with lexapro. Not on trazodone. Doesn't feel depressed. Feels anxious. Worries more than used too. Sleep is poor, broken throughout the night.  Cannot take cymbalta.   HLD- compliant with crestor  HTN- compliant with lisinopril, cardizem. hasnt had to use nitro.  Denies exertional chest pain or pressure, numbness or tingling radiating to left arm or jaw, palpitations, dizziness, frequent headaches, changes in vision, or shortness of breath.    11/2018 xray lumbar - severe DDD 01/2018 - MRI thoracic spine showed mild disc bulging. 01/2018 MRI cervical showed  Narrowing, severe at c5-c6  Dr Ron Parker  - MS; 01/28/20 Dr Fletcher Anon - HTN 12/2019 HISTORY:  Past Medical History:  Diagnosis Date  . Anginal pain ([place])   . Arthritis   . COPD (chronic obstructive pulmonary disease) ([place])   . Depression   . Dyspnea   . Fibromyalgia   . Headache   . Heat stroke 2006  . Irritable bowel syndrome   . MS (multiple sclerosis) ([place])    Followed by Dr. Manuella Ghazi   Past Surgical History:  Procedure Laterality Date  . ABDOMINAL HYSTERECTOMY     CERVIX intact  . CHOLECYSTECTOMY N/A 01/03/2016   Procedure: LAPAROSCOPIC CHOLECYSTECTOMY WITH INTRAOPERATIVE CHOLANGIOGRAM;  Surgeon: Florene Glen, MD;  Location: [facility]  ORS;  Service: General;  Laterality: N/A;  . ENDOSCOPIC RETROGRADE CHOLANGIOPANCREATOGRAPHY (ERCP) WITH PROPOFOL N/A 01/04/2016   Procedure: ENDOSCOPIC RETROGRADE CHOLANGIOPANCREATOGRAPHY (ERCP) WITH PROPOFOL;  Surgeon: Lucilla Lame, MD;  Location: [facility] ENDOSCOPY;  Service: Endoscopy;  Laterality: N/A;  . FOOT SURGERY  2001  . LAPAROSCOPIC ENDOMETRIOSIS FULGURATION    . RIGHT/LEFT HEART CATH AND CORONARY ANGIOGRAPHY Bilateral 09/29/2019   Procedure: RIGHT/LEFT HEART CATH AND CORONARY ANGIOGRAPHY;  Surgeon: Nelva Bush, MD;  Location: [facility];  Service: Cardiovascular;  Laterality: Bilateral;   Family History  Problem Relation Age of Onset  . Hypertension Mother   . Hyperlipidemia Mother   . Arthritis Mother   . Cancer Father        throat  . Heart disease Brother   . Bipolar disorder Son   . Schizophrenia Son   . Cancer Maternal Grandfather        liver  . Cancer Paternal Grandmother        liver    Allergies: Valium [diazepam] Current Outpatient Medications on File Prior to Visit  Medication Sig Dispense Refill  . acyclovir (ZOVIRAX) 400 MG tablet Take 1 tablet (400 mg total) by mouth daily. 90 tablet 3  . Albuterol Sulfate (PROAIR RESPICLICK) 341 (90 Base) MCG/ACT AEPB Inhale 90 mcg into the lungs every 6 (six) hours as needed (as needed for cough, wheezing). (Patient taking differently: Inhale 180 mcg into the  lungs every 6 (six) hours as needed (as needed for cough, wheezing). ) 1 each 2  . baclofen (LIORESAL) 10 MG tablet Take 1 tablet (10 mg total) by mouth 3 (three) times daily as needed for muscle spasms. 30 each 4  . cholecalciferol (VITAMIN D) 1000 units tablet Take 1,000 Units by mouth daily.    . diclofenac sodium (VOLTAREN) 1 % GEL Apply 4 g topically 4 (four) times daily. 1 Tube 3  . diltiazem (CARDIZEM CD) 120 MG 24 hr capsule Take 1 capsule (120 mg total) by mouth daily. 30 capsule 11  . escitalopram (LEXAPRO) 20 MG tablet Take 1 tablet (20 mg total) by  mouth daily. 90 tablet 1  . Fluticasone-Salmeterol (ADVAIR DISKUS) 250-50 MCG/DOSE AEPB Inhale 1 puff into the lungs 2 (two) times daily. 1 each 3  . Lifitegrast (XIIDRA) 5 % SOLN Apply to eye 2 (two) times daily.    Marland Kitchen lisinopril (ZESTRIL) 10 MG tablet Take 1 tablet (10 mg total) by mouth daily. 90 tablet 4  . Multiple Vitamins-Minerals (CENTRUM SILVER ULTRA WOMENS) TABS Take 1 tablet by mouth daily.    . nitroGLYCERIN (NITROSTAT) 0.4 MG SL tablet Place 1 tablet (0.4 mg total) under the tongue every 5 (five) minutes as needed for chest pain. 25 tablet 3  . ondansetron (ZOFRAN-ODT) 4 MG disintegrating tablet Take 1 tablet (4 mg total) by mouth every 6 (six) hours as needed for nausea or vomiting. 20 tablet 2  . rosuvastatin (CRESTOR) 20 MG tablet Take 1 tablet (20 mg total) by mouth daily. 90 tablet 3   No current facility-administered medications on file prior to visit.    Social History   Tobacco Use  . Smoking status: Current Every Day Smoker    Packs/day: 1.00    Years: 43.00    Pack years: 43.00  . Smokeless tobacco: Never Used  Substance Use Topics  . Alcohol use: No    Alcohol/week: 0.0 standard drinks  . Drug use: No    Review of Systems  Constitutional: Negative for chills and fever.  Respiratory: Negative for cough.   Cardiovascular: Negative for chest pain and palpitations.  Gastrointestinal: Negative for nausea and vomiting.  Musculoskeletal: Positive for back pain.  Neurological: Positive for numbness (one episode, resolved).      Objective:    BP 122/78   Pulse 71   Temp (!) 96.6 F (35.9 C) (Temporal)   Ht 5\' 5"  (1.651 m)   Wt 168 lb 12.8 oz (76.6 kg)   SpO2 99%   BMI 28.09 kg/m  BP Readings from Last 3 Encounters:  03/08/20 122/78  01/06/20 116/80  09/29/19 107/64   Wt Readings from Last 3 Encounters:  03/08/20 168 lb 12.8 oz (76.6 kg)  01/06/20 166 lb 12 oz (75.6 kg)  09/29/19 165 lb (74.8 kg)    Physical Exam Vitals reviewed.    Constitutional:      Appearance: She is well-developed.  Eyes:     Conjunctiva/sclera: Conjunctivae normal.  Cardiovascular:     Rate and Rhythm: Normal rate and regular rhythm.     Pulses: Normal pulses.     Heart sounds: Normal heart sounds.  Pulmonary:     Effort: Pulmonary effort is normal.     Breath sounds: Normal breath sounds. No wheezing, rhonchi or rales.  Musculoskeletal:       Arms:     Lumbar back: No swelling, edema, spasms, tenderness or bony tenderness. Normal range of motion.     Comments:  Full range of motion with flexion, tension, lateral side bends. Pain with lateral movement. No bony tenderness. No pain, numbness, tingling elicited with single leg raise bilaterally, however weakness and pain noted with single leg raise of left leg.  Pain with palpation over left SI joint.  Skin:    General: Skin is warm and dry.  Neurological:     Mental Status: She is alert.     Sensory: No sensory deficit.     Deep Tendon Reflexes:     Reflex Scores:      Patellar reflexes are 2+ on the right side and 2+ on the left side.    Comments: Sensation and strength intact bilateral lower extremities.  Psychiatric:        Speech: Speech normal.        Behavior: Behavior normal.        Thought Content: Thought content normal.        Assessment & Plan:   Problem List Items Addressed This Visit      Cardiovascular and Mediastinum   HTN (hypertension)    Stable, controlled.  She has not had to use nitro.  She will continue to follow with cardiology.        Other   Generalized anxiety disorder    Stable, continue Lexapro and trazodone.      Low back pain - Primary    Acute on chronic.  Suspect degenerative changes as well as SI joint dysfunction.  X-rays showed DDD.  Will start on low-dose gabapentin and titrate up.  Referral to Dr. Yves Dill for further evaluation.      Relevant Medications   gabapentin (NEURONTIN) 100 MG capsule   Other Relevant Orders   Ambulatory  referral to Orthopedic Surgery   DG Lumbar Spine Complete (Completed)   DG HIPS BILAT WITH PELVIS 2V (Completed)       I have discontinued Shianna D. Schryver's Dimethyl Fumarate, carbamazepine, and traZODone. I am also having her start on gabapentin. Additionally, I am having her maintain her Albuterol Sulfate, Centrum Silver Ultra Womens, cholecalciferol, diclofenac sodium, baclofen, acyclovir, escitalopram, Fluticasone-Salmeterol, rosuvastatin, diltiazem, nitroGLYCERIN, ondansetron, lisinopril, and Xiidra.   Meds ordered this encounter  Medications  . gabapentin (NEURONTIN) 100 MG capsule    Sig: Take 1 capsule (100 mg total) by mouth 3 (three) times daily.    Dispense:  90 capsule    Refill:  3    Order Specific Question:   Supervising Provider    Answer:   Sherlene Shams [[id number]]    Return precautions given.   Risks, benefits, and alternatives of the medications and treatment plan prescribed today were discussed, and patient expressed understanding.   Education regarding symptom management and diagnosis given to patient on AVS.  Continue to follow with Allegra Grana, FNP for routine health maintenance.   Chaya D Damon and I agreed with plan.   Rennie Plowman, FNP

## 2020-03-08 NOTE — Unmapped (Signed)
This patient has been disenrolled from the [facility] specialty pharmacy services due to medication discontinuation resulting from side effect intolerance - potential allergy.    Jennifer Lewis  [facility] Special[facility] Pharmacist

## 2020-03-09 ENCOUNTER — Encounter: Payer: Self-pay | Admitting: Family

## 2020-03-09 ENCOUNTER — Other Ambulatory Visit: Payer: Self-pay | Admitting: Family

## 2020-03-09 ENCOUNTER — Other Ambulatory Visit: Payer: Medicare Other

## 2020-03-09 DIAGNOSIS — M545 Low back pain, unspecified: Secondary | ICD-10-CM

## 2020-03-09 NOTE — Assessment & Plan Note (Signed)
Acute on chronic.  Suspect degenerative changes as well as SI joint dysfunction.  X-rays showed DDD.  Will start on low-dose gabapentin and titrate up.  Referral to Dr. Yves Dill for further evaluation.

## 2020-03-09 NOTE — Assessment & Plan Note (Signed)
Stable, continue Lexapro and trazodone.

## 2020-03-09 NOTE — Assessment & Plan Note (Signed)
Stable, controlled.  She has not had to use nitro.  She will continue to follow with cardiology.

## 2020-03-18 DIAGNOSIS — J449 Chronic obstructive pulmonary disease, unspecified: Secondary | ICD-10-CM | POA: Diagnosis not present

## 2020-03-20 ENCOUNTER — Encounter: Payer: Self-pay | Admitting: Family

## 2020-03-23 ENCOUNTER — Other Ambulatory Visit: Payer: Self-pay | Admitting: Family Medicine

## 2020-03-23 DIAGNOSIS — M5442 Lumbago with sciatica, left side: Secondary | ICD-10-CM

## 2020-03-23 DIAGNOSIS — M5416 Radiculopathy, lumbar region: Secondary | ICD-10-CM | POA: Diagnosis not present

## 2020-03-23 DIAGNOSIS — M5136 Other intervertebral disc degeneration, lumbar region: Secondary | ICD-10-CM | POA: Diagnosis not present

## 2020-03-24 ENCOUNTER — Ambulatory Visit
Admission: RE | Admit: 2020-03-24 | Discharge: 2020-03-24 | Disposition: A | Discharge status: Home / Self Care | Payer: Medicare Other | Source: Ambulatory Visit | Attending: Family Medicine | Admitting: Family Medicine

## 2020-03-24 ENCOUNTER — Other Ambulatory Visit: Payer: Self-pay

## 2020-03-24 DIAGNOSIS — M5126 Other intervertebral disc displacement, lumbar region: Secondary | ICD-10-CM | POA: Diagnosis not present

## 2020-03-24 DIAGNOSIS — M48061 Spinal stenosis, lumbar region without neurogenic claudication: Secondary | ICD-10-CM | POA: Diagnosis not present

## 2020-03-24 DIAGNOSIS — M5442 Lumbago with sciatica, left side: Secondary | ICD-10-CM | POA: Diagnosis not present

## 2020-03-25 DIAGNOSIS — M5442 Lumbago with sciatica, left side: Secondary | ICD-10-CM | POA: Diagnosis not present

## 2020-03-25 DIAGNOSIS — M5126 Other intervertebral disc displacement, lumbar region: Secondary | ICD-10-CM | POA: Diagnosis not present

## 2020-03-25 DIAGNOSIS — M5416 Radiculopathy, lumbar region: Secondary | ICD-10-CM | POA: Diagnosis not present

## 2020-03-25 DIAGNOSIS — M5136 Other intervertebral disc degeneration, lumbar region: Secondary | ICD-10-CM | POA: Diagnosis not present

## 2020-04-08 ENCOUNTER — Encounter: Payer: Self-pay | Admitting: Family

## 2020-04-08 ENCOUNTER — Other Ambulatory Visit: Payer: Medicare Other

## 2020-04-18 DIAGNOSIS — J449 Chronic obstructive pulmonary disease, unspecified: Secondary | ICD-10-CM | POA: Diagnosis not present

## 2020-04-26 ENCOUNTER — Other Ambulatory Visit: Payer: Self-pay | Admitting: Family

## 2020-04-26 ENCOUNTER — Telehealth: Payer: Self-pay | Admitting: Family

## 2020-04-26 DIAGNOSIS — F411 Generalized anxiety disorder: Secondary | ICD-10-CM

## 2020-04-26 MED ORDER — ESCITALOPRAM OXALATE 20 MG PO TABS: 20.0000 mg | ORAL_TABLET | Freq: Every day | ORAL | 0 refills | Status: DC

## 2020-04-26 NOTE — Telephone Encounter (Signed)
Patient reports compliance to refills of lisinopril taking daily, verified [facility] [address] fills script on 04/05/20.

## 2020-04-27 ENCOUNTER — Ambulatory Visit: Payer: Medicare Other | Attending: Anesthesiology | Admitting: Anesthesiology

## 2020-04-27 ENCOUNTER — Encounter: Payer: Self-pay | Admitting: Anesthesiology

## 2020-04-27 ENCOUNTER — Other Ambulatory Visit: Payer: Self-pay

## 2020-04-27 DIAGNOSIS — M47812 Spondylosis without myelopathy or radiculopathy, cervical region: Secondary | ICD-10-CM | POA: Insufficient documentation

## 2020-04-27 DIAGNOSIS — G8929 Other chronic pain: Secondary | ICD-10-CM | POA: Diagnosis not present

## 2020-04-27 DIAGNOSIS — M47816 Spondylosis without myelopathy or radiculopathy, lumbar region: Secondary | ICD-10-CM | POA: Insufficient documentation

## 2020-04-27 DIAGNOSIS — F1190 Opioid use, unspecified, uncomplicated: Secondary | ICD-10-CM | POA: Diagnosis present

## 2020-04-27 DIAGNOSIS — M5442 Lumbago with sciatica, left side: Secondary | ICD-10-CM | POA: Insufficient documentation

## 2020-04-27 DIAGNOSIS — G894 Chronic pain syndrome: Secondary | ICD-10-CM | POA: Insufficient documentation

## 2020-04-27 DIAGNOSIS — M5030 Other cervical disc degeneration, unspecified cervical region: Secondary | ICD-10-CM | POA: Insufficient documentation

## 2020-04-27 DIAGNOSIS — M5441 Lumbago with sciatica, right side: Secondary | ICD-10-CM | POA: Insufficient documentation

## 2020-04-27 DIAGNOSIS — M5136 Other intervertebral disc degeneration, lumbar region: Secondary | ICD-10-CM | POA: Insufficient documentation

## 2020-04-27 DIAGNOSIS — M51369 Other intervertebral disc degeneration, lumbar region without mention of lumbar back pain or lower extremity pain: Secondary | ICD-10-CM | POA: Insufficient documentation

## 2020-04-27 NOTE — Progress Notes (Signed)
Safety precautions to be maintained throughout the outpatient stay will include: orient to surroundings, keep bed in low position, maintain call bell within reach at all times, provide assistance with transfer out of bed and ambulation.  

## 2020-04-27 NOTE — Progress Notes (Signed)
Subjective:  Patient ID: Debra Hogan, female    DOB: 06-15-1961  Age: 59 y.o. MRN: [id number]  CC: Muscle Pain (fibromyalgia), Back Pain (lower), and Neck Pain     PROCEDURE: None  HPI Debra Hogan presents for a new patient evaluation.  She is a pleasant 59 year old white female with a longstanding history of low back pain and thoracic back pain as well as diffuse body pain.  The back pain started suddenly and has no known precipitating event.  She also has history of fibromyalgia and generally speaking her diffuse body pain has been getting worse she reports.  Her maximum pain score is a 10 best a 7 and averages about an 8 or 9 for throughout most of the day.  Does not appear to be influenced by time of day but is worse with activity after activity even while sitting.  Aggravating factors include bending kneeling lifting motion sitting standing squatting or any type of twisting activity.  Pain is alleviated by rest hot packs or medication management.  She is previously been on Percocet 5 mg tablets and gotten excellent relief with that and less so with tramadol.  Warm showers seem to help.  Associating factors include daytime cramping especially in the calves associated fatigue with difficulty concentration and spasming throughout the lumbar region and lower body.  She occasionally has tingling affecting the left foot more than the right and affecting the left calf and lower leg.  She has had a previous MRI which was reviewed with her today showing evidence of multilevel degenerative disc disease and some S1 encroachment at L5-S1.  She has had previous neurosurgical evaluation for 2 previous aneurysms.  A lumbar MRI was reviewed as well today.  It also showed evidence of facet degeneration.  Her bowel and bladder function have been reasonable she has difficulty ambulating secondary to pain and spasm.  Her strength is been reasonably well-preserved she reports.  History Debra Hogan has a past medical  history of Anginal pain (HCC), Arthritis, COPD (chronic obstructive pulmonary disease) (HCC), Depression, Dyspnea, Fibromyalgia, Headache, Heat stroke (2006), Irritable bowel syndrome, and MS (multiple sclerosis) (HCC).   She has a past surgical history that includes Laparoscopic endometriosis fulguration; Foot surgery (2001); Abdominal hysterectomy; Cholecystectomy (N/A, 01/03/2016); Endoscopic retrograde cholangiopancreatography (ercp) with propofol (N/A, 01/04/2016); and RIGHT/LEFT HEART CATH AND CORONARY ANGIOGRAPHY (Bilateral, 09/29/2019).   Her family history includes Arthritis in her mother; Bipolar disorder in her son; Cancer in her father, maternal grandfather, and paternal grandmother; Heart disease in her brother; Hyperlipidemia in her mother; Hypertension in her mother; Schizophrenia in her son.She reports that she has been smoking. She has a 43.00 pack-year smoking history. She has never used smokeless tobacco. She reports that she does not drink alcohol or use drugs.  No procedure found.  No results found for: TOXASSSELUR  Outpatient Medications Prior to Visit  Medication Sig Dispense Refill  . acyclovir (ZOVIRAX) 400 MG tablet Take 1 tablet (400 mg total) by mouth daily. 90 tablet 3  . Albuterol Sulfate (PROAIR RESPICLICK) 108 (90 Base) MCG/ACT AEPB Inhale 90 mcg into the lungs every 6 (six) hours as needed (as needed for cough, wheezing). (Patient taking differently: Inhale 180 mcg into the lungs every 6 (six) hours as needed (as needed for cough, wheezing). ) 1 each 2  . aspirin EC 81 MG tablet Take 81 mg by mouth daily.    . baclofen (LIORESAL) 10 MG tablet Take 1 tablet (10 mg total) by mouth 3 (  three) times daily as needed for muscle spasms. 30 each 4  . cholecalciferol (VITAMIN D) 1000 units tablet Take 1,000 Units by mouth daily.    . diclofenac sodium (VOLTAREN) 1 % GEL Apply 4 g topically 4 (four) times daily. 1 Tube 3  . diltiazem (CARDIZEM CD) 120 MG 24 hr capsule Take 1  capsule (120 mg total) by mouth daily. 30 capsule 11  . escitalopram (LEXAPRO) 20 MG tablet Take 1 tablet (20 mg total) by mouth daily. 90 tablet 0  . Fluticasone-Salmeterol (ADVAIR DISKUS) 250-50 MCG/DOSE AEPB Inhale 1 puff into the lungs 2 (two) times daily. 1 each 3  . gabapentin (NEURONTIN) 100 MG capsule Take 1 capsule (100 mg total) by mouth 3 (three) times daily. 90 capsule 3  . Lifitegrast (XIIDRA) 5 % SOLN Apply to eye 2 (two) times daily.    Marland Kitchen lisinopril (ZESTRIL) 10 MG tablet Take 1 tablet (10 mg total) by mouth daily. 90 tablet 4  . Multiple Vitamins-Minerals (CENTRUM SILVER ULTRA WOMENS) TABS Take 1 tablet by mouth daily.    . nitroGLYCERIN (NITROSTAT) 0.4 MG SL tablet Place 1 tablet (0.4 mg total) under the tongue every 5 (five) minutes as needed for chest pain. 25 tablet 3  . ondansetron (ZOFRAN-ODT) 4 MG disintegrating tablet Take 1 tablet (4 mg total) by mouth every 6 (six) hours as needed for nausea or vomiting. 20 tablet 2  . rosuvastatin (CRESTOR) 20 MG tablet Take 1 tablet (20 mg total) by mouth daily. 90 tablet 3   No facility-administered medications prior to visit.   Lab Results  Component Value Date   WBC 4.7 09/25/2019   HGB 14.8 09/25/2019   HCT 42.8 09/25/2019   PLT 196 09/25/2019   GLUCOSE 93 09/25/2019   CHOL 203 (H) 07/09/2019   TRIG 285.0 (H) 07/09/2019   HDL 46.00 07/09/2019   LDLDIRECT 108.0 07/09/2019   LDLCALC 136 (H) 09/19/2013   ALT 18 08/20/2019   AST 24 08/20/2019   NA 133 (L) 09/25/2019   K 4.6 09/25/2019   CL 96 09/25/2019   CREATININE 0.66 09/25/2019   BUN 12 09/25/2019   CO2 24 09/25/2019   TSH 5.40 (H) 07/09/2019   INR 1.0 10/22/2015   HGBA1C 5.0 07/09/2019    --------------------------------------------------------------------------------------------------------------------- MR LUMBAR SPINE WO CONTRAST  Result Date: 03/24/2020 CLINICAL DATA:  Chronic low back and left worse than right hip pain with numbness and tingling. No known  injury. EXAM: MRI LUMBAR SPINE WITHOUT CONTRAST TECHNIQUE: Multiplanar, multisequence MR imaging of the lumbar spine was performed. No intravenous contrast was administered. COMPARISON:  Plain films lumbar spine 03/08/2020. FINDINGS: Segmentation:  Standard. Alignment: Convex right scoliosis is noted as seen on plain films. No listhesis. Vertebrae: No fracture, evidence of discitis, or bone lesion. There is some degenerative endplate signal change anteriorly at L1-2. Conus medullaris and cauda equina: Conus extends to the L1 level. Conus and cauda equina appear normal. Paraspinal and other soft tissues: Negative. Disc levels: T11-12 is imaged in the sagittal plane only and negative. T12-L1: Shallow disc bulge is more prominent to the left. No stenosis. L1-2: Shallow disc bulge without stenosis. L2-3: Negative. L3-4: The patient has a shallow disc bulge and superimposed left subarticular recess protrusion. There is mild central canal and left subarticular recess narrowing. Neural foramina are open. L4-5: Shallow disc bulge, ligamentum flavum thickening and mild facet degenerative disease. There is mild central canal narrowing. Foramina are open. L5-S1: Mild facet degenerative change. There is an annular fissure and  small, focal protrusion in the left subarticular recess impinging on the left S1 root. Neural foramina are open. IMPRESSION: Mild central canal and left subarticular recess narrowing at L3-4. Mild central canal at L4-5. Small, focal protrusion in the left subarticular recess at L5-S1 impinges on the left S1 root. Electronically Signed   By: Drusilla Kanner M.D.   On: 03/24/2020 10:35       ---------------------------------------------------------------------------------------------------------------------- Past Medical History:  Diagnosis Date  . Anginal pain (HCC)   . Arthritis   . COPD (chronic obstructive pulmonary disease) (HCC)   . Depression   . Dyspnea   . Fibromyalgia   . Headache    . Heat stroke 2006  . Irritable bowel syndrome   . MS (multiple sclerosis) (HCC)    Followed by Dr. Sherryll Burger    Past Surgical History:  Procedure Laterality Date  . ABDOMINAL HYSTERECTOMY     CERVIX intact  . CHOLECYSTECTOMY N/A 01/03/2016   Procedure: LAPAROSCOPIC CHOLECYSTECTOMY WITH INTRAOPERATIVE CHOLANGIOGRAM;  Surgeon: Lattie Haw, MD;  Location: [facility];  Service: General;  Laterality: N/A;  . ENDOSCOPIC RETROGRADE CHOLANGIOPANCREATOGRAPHY (ERCP) WITH PROPOFOL N/A 01/04/2016   Procedure: ENDOSCOPIC RETROGRADE CHOLANGIOPANCREATOGRAPHY (ERCP) WITH PROPOFOL;  Surgeon: Midge Minium, MD;  Location: [facility];  Service: Endoscopy;  Laterality: N/A;  . FOOT SURGERY  2001  . LAPAROSCOPIC ENDOMETRIOSIS FULGURATION    . RIGHT/LEFT HEART CATH AND CORONARY ANGIOGRAPHY Bilateral 09/29/2019   Procedure: RIGHT/LEFT HEART CATH AND CORONARY ANGIOGRAPHY;  Surgeon: Yvonne Kendall, MD;  Location: [facility];  Service: Cardiovascular;  Laterality: Bilateral;    Family History  Problem Relation Age of Onset  . Hypertension Mother   . Hyperlipidemia Mother   . Arthritis Mother   . Cancer Father        throat  . Heart disease Brother   . Bipolar disorder Son   . Schizophrenia Son   . Cancer Maternal Grandfather        liver  . Cancer Paternal Grandmother        liver    Social History   Tobacco Use  . Smoking status: Current Every Day Smoker    Packs/day: 1.00    Years: 43.00    Pack years: 43.00  . Smokeless tobacco: Never Used  Substance Use Topics  . Alcohol use: No    Alcohol/week: 0.0 standard drinks    ---------------------------------------------------------------------------------------------------------------------  Scheduled Meds: Continuous Infusions: PRN Meds:.   BP 138/66   Pulse (!) 102   Temp 97.9 F (36.6 C) (Temporal)   Resp 16   Ht 5\' 5"  (1.651 m)   Wt 162 lb (73.5 kg)   SpO2 100% Comment: O2 at 2L/min  BNC  BMI 26.96 kg/m    BP  Readings from Last 3 Encounters:  04/27/20 138/66  03/08/20 122/78  01/06/20 116/80     Wt Readings from Last 3 Encounters:  04/27/20 162 lb (73.5 kg)  03/08/20 168 lb 12.8 oz (76.6 kg)  01/06/20 166 lb 12 oz (75.6 kg)     ----------------------------------------------------------------------------------------------------------------------  ROS Review of Systems CNS: No headaches confusion or loss of consciousness Cardiac: No angina or palpitations Lungs: No shortness of breath or dyspnea Musculoskeletal reveals diffuse lumbar spasming with radiation and cramping into the left greater than right lower extremity with pain on standing and diffuse body pain at rest and in general.  Objective:  BP 138/66   Pulse (!) 102   Temp 97.9 F (36.6 C) (Temporal)   Resp 16  Ht 5\' 5"  (1.651 m)   Wt 162 lb (73.5 kg)   SpO2 100% Comment: O2 at 2L/min  BNC  BMI 26.96 kg/m   Physical Exam Patient is alert oriented cooperative compliant. Pupils are equally round reactive to light extraocular muscles intact Heart is regular rate and rhythm without murmur or rub Lungs are clear to all station with distant breath sounds and some slight wheezing Musculoskeletal reveals some paraspinous muscle tenderness which are diffuse throughout the lower lumbar region.  Patient has difficulty going from seated to standing and also trying to lie down supine on the bed.  With the patient supine she has a positive straight leg raise left side greater than right but both are positive.  Her strength appears to be 5/5 both proximal distal to the lower extremities.  She has no deficit on light touch sensation.  She has difficulty with ambulation with a severely antalgic gait.  Extension of the low back reveals severe pain with bilateral rotation.     Assessment & Plan:   Annalyce was seen today for muscle pain, back pain and neck pain.  Diagnoses and all orders for this visit:  DDD (degenerative disc  disease), lumbar -     Lumbar Epidural Injection; Future  Chronic bilateral low back pain with bilateral sciatica -     Lumbar Epidural Injection; Future  DDD (degenerative disc disease), cervical  Facet arthropathy, cervical  Lumbar facet joint syndrome  Chronic, continuous use of opioids -     ToxASSURE Select 13 (MW), Urine  Chronic pain syndrome -     ToxASSURE Select 13 (MW), Urine     ----------------------------------------------------------------------------------------------------------------------  Problem List Items Addressed This Visit      Unprioritized   Chronic low back pain with bilateral sciatica   Relevant Medications   aspirin EC 81 MG tablet   Other Relevant Orders   Lumbar Epidural Injection   Chronic pain syndrome   Relevant Medications   aspirin EC 81 MG tablet   Other Relevant Orders   ToxASSURE Select 13 (MW), Urine   Chronic, continuous use of opioids   Relevant Orders   ToxASSURE Select 13 (MW), Urine   DDD (degenerative disc disease), cervical   Relevant Medications   aspirin EC 81 MG tablet   DDD (degenerative disc disease), lumbar   Relevant Medications   aspirin EC 81 MG tablet   Other Relevant Orders   Lumbar Epidural Injection   Facet arthropathy, cervical   Lumbar facet joint syndrome   Relevant Medications   aspirin EC 81 MG tablet      ----------------------------------------------------------------------------------------------------------------------  1. DDD (degenerative disc disease), lumbar She has evidence of left-sided sciatica and severe degenerative disc disease symptoms and her MRI is reviewed today.  I believe she would be a candidate for an epidural steroid to see if this would give her some relief.  We have gone over the risks and benefits of the procedure with her in full detail and all questions were answered.  I will schedule this for the next available date.  I have encouraged her to continue stretching  strengthening exercises however I think formal PT would be best deferred until she Galicia be more comfortable. - Lumbar Epidural Injection; Future  2. Chronic bilateral low back pain with bilateral sciatica As above - Lumbar Epidural Injection; Future  3. DDD (degenerative disc disease), cervical As above and continue cervical stretching strengthening exercises.  4. Facet arthropathy, cervical As above  5. Lumbar facet joint syndrome  As above  6. Chronic, continuous use of opioids She ultimately Dyke be a candidate for a low dose of opioid.  She has had good success with Percocet in the past and considering her diffuse body pain fibromyalgia and's the severity of her low back pain this Senne be reasonable.  A UDS will be requested today.  We have gone over the risks and benefits of chronic opioid maintenance therapy. - ToxASSURE Select 13 (MW), Urine  7. Chronic pain syndrome Requested today for UDS. - ToxASSURE Select 13 (MW), Urine    ----------------------------------------------------------------------------------------------------------------------  I am having Hartleigh D. Romanski maintain her Albuterol Sulfate, Centrum Silver Ultra Womens, cholecalciferol, diclofenac sodium, baclofen, acyclovir, Fluticasone-Salmeterol, rosuvastatin, diltiazem, nitroGLYCERIN, ondansetron, lisinopril, Xiidra, gabapentin, escitalopram, and aspirin EC.   No orders of the defined types were placed in this encounter.      Follow-up: Return in about 2 weeks (around 05/11/2020) for procedure.    Yevette Edwards, MD 2:06 PM  The [place] practitioner database for opioid medications on this patient has been reviewed by me and my staff   Greater than 50% of the total encounter time was spent in counseling and / or coordination of care.     This dictation was performed utilizing Conservation officer, historic buildings.  Please excuse any unintentional or mistaken typographical errors as a result.

## 2020-04-27 NOTE — Patient Instructions (Signed)
____________________________________________________________________________________________  Preparing for Procedure with Sedation  Procedure appointments are limited to planned procedures: . No Prescription Refills. . No disability issues will be discussed. . No medication changes will be discussed.  Instructions: . Oral Intake: Do not eat or drink anything for at least 3 hours prior to your procedure. (Exception: Blood Pressure Medication. See below.) . Transportation: Unless otherwise stated by your physician, you Rumer drive yourself after the procedure. . Blood Pressure Medicine: Do not forget to take your blood pressure medicine with a sip of water the morning of the procedure. If your Diastolic (lower reading)is above 100 mmHg, elective cases will be cancelled/rescheduled. . Blood thinners: These will need to be stopped for procedures. Notify our staff if you are taking any blood thinners. Depending on which one you take, there will be specific instructions on how and when to stop it. . Diabetics on insulin: Notify the staff so that you can be scheduled 1st case in the morning. If your diabetes requires high dose insulin, take only  of your normal insulin dose the morning of the procedure and notify the staff that you have done so. . Preventing infections: Shower with an antibacterial soap the morning of your procedure. . Build-up your immune system: Take 1000 mg of Vitamin C with every meal (3 times a day) the day prior to your procedure. . Antibiotics: Inform the staff if you have a condition or reason that requires you to take antibiotics before dental procedures. . Pregnancy: If you are pregnant, call and cancel the procedure. . Sickness: If you have a cold, fever, or any active infections, call and cancel the procedure. . Arrival: You must be in the facility at least 30 minutes prior to your scheduled procedure. . Children: Do not bring children with you. . Dress appropriately:  Bring dark clothing that you would not mind if they get stained. . Valuables: Do not bring any jewelry or valuables.  Reasons to call and reschedule or cancel your procedure: (Following these recommendations will minimize the risk of a serious complication.) . Surgeries: Avoid having procedures within 2 weeks of any surgery. (Avoid for 2 weeks before or after any surgery). . Flu Shots: Avoid having procedures within 2 weeks of a flu shots or . (Avoid for 2 weeks before or after immunizations). . Barium: Avoid having a procedure within 7-10 days after having had a radiological study involving the use of radiological contrast. (Myelograms, Barium swallow or enema study). . Heart attacks: Avoid any elective procedures or surgeries for the initial 6 months after a "Myocardial Infarction" (Heart Attack). . Blood thinners: It is imperative that you stop these medications before procedures. Let us know if you if you take any blood thinner.  . Infection: Avoid procedures during or within two weeks of an infection (including chest colds or gastrointestinal problems). Symptoms associated with infections include: Localized redness, fever, chills, night sweats or profuse sweating, burning sensation when voiding, cough, congestion, stuffiness, runny nose, sore throat, diarrhea, nausea, vomiting, cold or Flu symptoms, recent or current infections. It is specially important if the infection is over the area that we intend to treat. . Heart and lung problems: Symptoms that Onorato suggest an active cardiopulmonary problem include: cough, chest pain, breathing difficulties or shortness of breath, dizziness, ankle swelling, uncontrolled high or unusually low blood pressure, and/or palpitations. If you are experiencing any of these symptoms, cancel your procedure and contact your primary care physician for an evaluation.  Remember:  Regular Business hours are:    Monday to Thursday 8:00 AM to 4:00 PM  Provider's  Schedule: Francisco Naveira, MD:  Procedure days: Tuesday and Thursday 7:30 AM to 4:00 PM  Bilal Lateef, MD:  Procedure days: Monday and Wednesday 7:30 AM to 4:00 PM ____________________________________________________________________________________________    

## 2020-05-03 LAB — TOXASSURE SELECT 13 (MW), URINE

## 2020-05-13 ENCOUNTER — Other Ambulatory Visit: Payer: Self-pay

## 2020-05-13 ENCOUNTER — Ambulatory Visit (INDEPENDENT_AMBULATORY_CARE_PROVIDER_SITE_OTHER): Payer: Medicare Other

## 2020-05-13 DIAGNOSIS — I2729 Other secondary pulmonary hypertension: Secondary | ICD-10-CM

## 2020-05-18 DIAGNOSIS — J449 Chronic obstructive pulmonary disease, unspecified: Secondary | ICD-10-CM | POA: Diagnosis not present

## 2020-05-21 ENCOUNTER — Ambulatory Visit: Payer: Medicare Other

## 2020-05-21 ENCOUNTER — Ambulatory Visit: Payer: Medicare Other | Admitting: Family

## 2020-05-21 ENCOUNTER — Telehealth: Payer: Self-pay

## 2020-05-21 DIAGNOSIS — Z0289 Encounter for other administrative examinations: Secondary | ICD-10-CM

## 2020-05-21 NOTE — Telephone Encounter (Signed)
Failed attempt to reach patient for scheduled awv. No answer. Left message to call nurse during allotted timeframe or reschedule as appropriate.

## 2020-05-31 ENCOUNTER — Encounter: Payer: Self-pay | Admitting: Anesthesiology

## 2020-05-31 ENCOUNTER — Ambulatory Visit (HOSPITAL_BASED_OUTPATIENT_CLINIC_OR_DEPARTMENT_OTHER): Payer: Medicare Other | Admitting: Anesthesiology

## 2020-05-31 ENCOUNTER — Other Ambulatory Visit: Payer: Self-pay | Admitting: Anesthesiology

## 2020-05-31 ENCOUNTER — Other Ambulatory Visit: Payer: Self-pay

## 2020-05-31 ENCOUNTER — Ambulatory Visit
Admission: RE | Admit: 2020-05-31 | Discharge: 2020-05-31 | Disposition: A | Discharge status: Home / Self Care | Payer: Medicare Other | Source: Ambulatory Visit | Attending: Anesthesiology | Admitting: Anesthesiology

## 2020-05-31 VITALS — BP 134/72 | HR 82 | Temp 98.2°F | Resp 20 | Ht 65.0 in | Wt 165.0 lb

## 2020-05-31 DIAGNOSIS — F1190 Opioid use, unspecified, uncomplicated: Secondary | ICD-10-CM

## 2020-05-31 DIAGNOSIS — G8929 Other chronic pain: Secondary | ICD-10-CM | POA: Insufficient documentation

## 2020-05-31 DIAGNOSIS — M5136 Other intervertebral disc degeneration, lumbar region: Secondary | ICD-10-CM | POA: Insufficient documentation

## 2020-05-31 DIAGNOSIS — M5441 Lumbago with sciatica, right side: Secondary | ICD-10-CM | POA: Diagnosis not present

## 2020-05-31 DIAGNOSIS — M47812 Spondylosis without myelopathy or radiculopathy, cervical region: Secondary | ICD-10-CM | POA: Insufficient documentation

## 2020-05-31 DIAGNOSIS — M5442 Lumbago with sciatica, left side: Secondary | ICD-10-CM | POA: Diagnosis not present

## 2020-05-31 DIAGNOSIS — M5030 Other cervical disc degeneration, unspecified cervical region: Secondary | ICD-10-CM

## 2020-05-31 DIAGNOSIS — R52 Pain, unspecified: Secondary | ICD-10-CM | POA: Insufficient documentation

## 2020-05-31 DIAGNOSIS — M47816 Spondylosis without myelopathy or radiculopathy, lumbar region: Secondary | ICD-10-CM | POA: Insufficient documentation

## 2020-05-31 DIAGNOSIS — G894 Chronic pain syndrome: Secondary | ICD-10-CM

## 2020-05-31 MED ORDER — TRIAMCINOLONE ACETONIDE 40 MG/ML IJ SUSP
40.0000 mg | Freq: Once | INTRAMUSCULAR | Status: AC
  Administered 2020-05-31: 40 mg

## 2020-05-31 MED ORDER — LACTATED RINGERS IV SOLN
1000.0000 mL | INTRAVENOUS | Status: DC
  Administered 2020-05-31: 1000 mL via INTRAVENOUS

## 2020-05-31 MED ORDER — MIDAZOLAM HCL 5 MG/5ML IJ SOLN
INTRAMUSCULAR | Status: AC
  Filled 2020-05-31: qty 5

## 2020-05-31 MED ORDER — TRIAMCINOLONE ACETONIDE 40 MG/ML IJ SUSP
INTRAMUSCULAR | Status: AC
  Filled 2020-05-31: qty 1

## 2020-05-31 MED ORDER — MIDAZOLAM HCL 5 MG/5ML IJ SOLN
5.0000 mg | Freq: Once | INTRAMUSCULAR | Status: AC
  Administered 2020-05-31: 2 mg via INTRAVENOUS

## 2020-05-31 MED ORDER — ROPIVACAINE HCL 2 MG/ML IJ SOLN
INTRAMUSCULAR | Status: AC
  Filled 2020-05-31: qty 10

## 2020-05-31 MED ORDER — SODIUM CHLORIDE 0.9% FLUSH
10.0000 mL | Freq: Once | INTRAVENOUS | Status: AC
  Administered 2020-05-31: 10 mL

## 2020-05-31 MED ORDER — LIDOCAINE HCL (PF) 1 % IJ SOLN
5.0000 mL | Freq: Once | INTRAMUSCULAR | Status: AC
  Administered 2020-05-31: 5 mL via SUBCUTANEOUS

## 2020-05-31 MED ORDER — IOHEXOL 180 MG/ML  SOLN
10.0000 mL | Freq: Once | INTRAMUSCULAR | Status: AC | PRN
  Administered 2020-05-31: 10 mL via EPIDURAL

## 2020-05-31 MED ORDER — SODIUM CHLORIDE (PF) 0.9 % IJ SOLN
INTRAMUSCULAR | Status: AC
  Filled 2020-05-31: qty 10

## 2020-05-31 MED ORDER — HYDROCODONE-ACETAMINOPHEN 5-325 MG PO TABS: 1.0000 | ORAL_TABLET | Freq: Two times a day (BID) | ORAL | 0 refills | Status: AC | PRN

## 2020-05-31 MED ORDER — ROPIVACAINE HCL 2 MG/ML IJ SOLN
10.0000 mL | Freq: Once | INTRAMUSCULAR | Status: AC
  Administered 2020-05-31: 10 mL via EPIDURAL

## 2020-05-31 MED ORDER — LIDOCAINE HCL (PF) 1 % IJ SOLN
INTRAMUSCULAR | Status: AC
  Filled 2020-05-31: qty 10

## 2020-05-31 MED ORDER — IOHEXOL 180 MG/ML  SOLN
INTRAMUSCULAR | Status: AC
  Filled 2020-05-31: qty 20

## 2020-05-31 NOTE — Patient Instructions (Signed)
____________________________________________________________________________________________  Post-Procedure Discharge Instructions  Instructions:  Apply ice:   Purpose: This will minimize any swelling and discomfort after procedure.   When: Day of procedure, as soon as you get home.  How: Fill a plastic sandwich bag with crushed ice. Cover it with a small towel and apply to injection site.  How long: (15 min on, 15 min off) Apply for 15 minutes then remove x 15 minutes.  Repeat sequence on day of procedure, until you go to bed.  Apply heat:   Purpose: To treat any soreness and discomfort from the procedure.  When: Starting the next day after the procedure.  How: Apply heat to procedure site starting the day following the procedure.  How long: Corne continue to repeat daily, until discomfort goes away.  Food intake: Start with clear liquids (like water) and advance to regular food, as tolerated.   Physical activities: Keep activities to a minimum for the first 8 hours after the procedure. After that, then as tolerated.  Driving: If you have received any sedation, be responsible and do not drive. You are not allowed to drive for 24 hours after having sedation.  Blood thinner: (Applies only to those taking blood thinners) You Hunton restart your blood thinner 6 hours after your procedure.  Insulin: (Applies only to Diabetic patients taking insulin) As soon as you can eat, you Kulesza resume your normal dosing schedule.  Infection prevention: Keep procedure site clean and dry. Shower daily and clean area with soap and water.  Post-procedure Pain Diary: Extremely important that this be done correctly and accurately. Recorded information will be used to determine the next step in treatment. For the purpose of accuracy, follow these rules:  Evaluate only the area treated. Do not report or include pain from an untreated area. For the purpose of this evaluation, ignore all other areas of pain,  except for the treated area.  After your procedure, avoid taking a long nap and attempting to complete the pain diary after you wake up. Instead, set your alarm clock to go off every hour, on the hour, for the initial 8 hours after the procedure. Document the duration of the numbing medicine, and the relief you are getting from it.  Do not go to sleep and attempt to complete it later. It will not be accurate. If you received sedation, it is likely that you were given a medication that Tinkey cause amnesia. Because of this, completing the diary at a later time Kawahara cause the information to be inaccurate. This information is needed to plan your care.  Follow-up appointment: Keep your post-procedure follow-up evaluation appointment after the procedure (usually 2 weeks for most procedures, 6 weeks for radiofrequencies). DO NOT FORGET to bring you pain diary with you.   Expect: (What should I expect to see with my procedure?)  From numbing medicine (AKA: Local Anesthetics): Numbness or decrease in pain. You [place]brough also experience some weakness, which if present, could last for the duration of the local anesthetic.  Onset: Full effect within 15 minutes of injected.  Duration: It will depend on the type of local anesthetic used. On the average, 1 to 8 hours.   From steroids (Applies only if steroids were used): Decrease in swelling or inflammation. Once inflammation is improved, relief of the pain will follow.  Onset of benefits: Depends on the amount of swelling present. The more swelling, the longer it will take for the benefits to be seen. In some cases, up to 10 days.    Duration: Steroids will stay in the system x 2 weeks. Duration of benefits will depend on multiple posibilities including persistent irritating factors.  Side-effects: If present, they Henriques typically last 2 weeks (the duration of the steroids).  Frequent: Cramps (if they occur, drink Gatorade and take over-the-counter Magnesium 450-500 mg  once to twice a day); water retention with temporary weight gain; increases in blood sugar; decreased immune system response; increased appetite.  Occasional: Facial flushing (red, warm cheeks); mood swings; menstrual changes.  Uncommon: Long-term decrease or suppression of natural hormones; bone thinning. (These are more common with higher doses or more frequent use. This is why we prefer that our patients avoid having any injection therapies in other practices.)   Very Rare: Severe mood changes; psychosis; aseptic necrosis.  From procedure: Some discomfort is to be expected once the numbing medicine wears off. This should be minimal if ice and heat are applied as instructed.  Call if: (When should I call?)  You experience numbness and weakness that gets worse with time, as opposed to wearing off.  New onset bowel or bladder incontinence. (Applies only to procedures done in the spine)  Emergency Numbers:  Durning business hours (Monday - Thursday, 8:00 AM - 4:00 PM) (Friday, 9:00 AM - 12:00 Noon): [phone]  After hours: [phone]  NOTE: If you are having a problem and are unable connect with, or to talk to a provider, then go to your nearest urgent care or emergency department. If the problem is serious and urgent, please call 911. ____________________________________________________________________________________________    

## 2020-05-31 NOTE — Progress Notes (Signed)
Safety precautions to be maintained throughout the outpatient stay will include: orient to surroundings, keep bed in low position, maintain call bell within reach at all times, provide assistance with transfer out of bed and ambulation.  

## 2020-06-01 ENCOUNTER — Telehealth: Payer: Self-pay | Admitting: *Deleted

## 2020-06-01 NOTE — Progress Notes (Signed)
Subjective:  Patient ID: Debra Hogan, female    DOB: 1961-05-02  Age: 59 y.o. MRN: [id number]  CC: Back Pain   Procedure: L5-S1 epidural steroid under fluoroscopic guidance with moderate sedation  HPI Debra Hogan presents for reevaluation.  She was last seen a month ago and reports having similar symptoms to what she is previously experienced and reported.  She continues to have left greater than right lower extremity pain radiating into the bilateral calves.  This is worse with standing and with activity.  The pain is incapacitating.  No change in bowel or bladder function are noted.  She has been taking tramadol previously and she reports that this is ineffective for her pain.  Despite efforts at conservative care she continues to have difficulty sleeping and difficulty with pain management.  She is been doing physical therapy exercises without difficulty but these have been limited secondary to the severity of her pain.  She desires to proceed with an epidural steroid injection today.  Outpatient Medications Prior to Visit  Medication Sig Dispense Refill  . acyclovir (ZOVIRAX) 400 MG tablet Take 1 tablet (400 mg total) by mouth daily. 90 tablet 3  . Albuterol Sulfate (PROAIR RESPICLICK) 108 (90 Base) MCG/ACT AEPB Inhale 90 mcg into the lungs every 6 (six) hours as needed (as needed for cough, wheezing). (Patient taking differently: Inhale 180 mcg into the lungs every 6 (six) hours as needed (as needed for cough, wheezing). ) 1 each 2  . aspirin EC 81 MG tablet Take 81 mg by mouth daily.    . baclofen (LIORESAL) 10 MG tablet Take 1 tablet (10 mg total) by mouth 3 (three) times daily as needed for muscle spasms. 30 each 4  . cholecalciferol (VITAMIN D) 1000 units tablet Take 1,000 Units by mouth daily.    . diclofenac sodium (VOLTAREN) 1 % GEL Apply 4 g topically 4 (four) times daily. 1 Tube 3  . diltiazem (CARDIZEM CD) 120 MG 24 hr capsule Take 1 capsule (120 mg total) by mouth daily. 30  capsule 11  . escitalopram (LEXAPRO) 20 MG tablet Take 1 tablet (20 mg total) by mouth daily. 90 tablet 0  . Fluticasone-Salmeterol (ADVAIR DISKUS) 250-50 MCG/DOSE AEPB Inhale 1 puff into the lungs 2 (two) times daily. 1 each 3  . gabapentin (NEURONTIN) 100 MG capsule Take 1 capsule (100 mg total) by mouth 3 (three) times daily. 90 capsule 3  . Lifitegrast (XIIDRA) 5 % SOLN Apply to eye 2 (two) times daily.    Marland Kitchen lisinopril (ZESTRIL) 10 MG tablet Take 1 tablet (10 mg total) by mouth daily. 90 tablet 4  . Multiple Vitamins-Minerals (CENTRUM SILVER ULTRA WOMENS) TABS Take 1 tablet by mouth daily.    . nitroGLYCERIN (NITROSTAT) 0.4 MG SL tablet Place 1 tablet (0.4 mg total) under the tongue every 5 (five) minutes as needed for chest pain. 25 tablet 3  . ondansetron (ZOFRAN-ODT) 4 MG disintegrating tablet Take 1 tablet (4 mg total) by mouth every 6 (six) hours as needed for nausea or vomiting. 20 tablet 2  . rosuvastatin (CRESTOR) 20 MG tablet Take 1 tablet (20 mg total) by mouth daily. 90 tablet 3   No facility-administered medications prior to visit.    Review of Systems CNS: No confusion or sedation Cardiac: No angina or palpitations GI: No abdominal pain or constipation Constitutional: No nausea vomiting fevers or chills  Objective:  BP 134/72   Pulse 82   Temp 98.2 F (36.8 C)  Resp 20   Ht 5\' 5"  (1.651 m)   Wt 165 lb (74.8 kg)   SpO2 95%   BMI 27.46 kg/m    BP Readings from Last 3 Encounters:  05/31/20 134/72  04/27/20 138/66  03/08/20 122/78     Wt Readings from Last 3 Encounters:  05/31/20 165 lb (74.8 kg)  04/27/20 162 lb (73.5 kg)  03/08/20 168 lb 12.8 oz (76.6 kg)     Physical Exam Pt is alert and oriented PERRL EOMI HEART IS RRR no murmur or rub LCTA no wheezing or rales MUSCULOSKELETAL reveals some paraspinous muscle tenderness but no overt trigger points.  She ambulates with an antalgic gait.  Her muscle tone and bulk are unchanged.  She has a positive  straight leg raise on the left side somewhat equivocal on the right  Labs  Lab Results  Component Value Date   HGBA1C 5.0 07/09/2019   HGBA1C 5.2 05/08/2018   Lab Results  Component Value Date   LDLCALC 136 (H) 09/19/2013   CREATININE 0.66 09/25/2019    -------------------------------------------------------------------------------------------------------------------- Lab Results  Component Value Date   WBC 4.7 09/25/2019   HGB 14.8 09/25/2019   HCT 42.8 09/25/2019   PLT 196 09/25/2019   GLUCOSE 93 09/25/2019   CHOL 203 (H) 07/09/2019   TRIG 285.0 (H) 07/09/2019   HDL 46.00 07/09/2019   LDLDIRECT 108.0 07/09/2019   LDLCALC 136 (H) 09/19/2013   ALT 18 08/20/2019   AST 24 08/20/2019   NA 133 (L) 09/25/2019   K 4.6 09/25/2019   CL 96 09/25/2019   CREATININE 0.66 09/25/2019   BUN 12 09/25/2019   CO2 24 09/25/2019   TSH 5.40 (H) 07/09/2019   INR 1.0 10/22/2015   HGBA1C 5.0 07/09/2019    --------------------------------------------------------------------------------------------------------------------- DG PAIN CLINIC C-ARM 1-60 MIN NO REPORT  Result Date: 05/31/2020 Fluoro was used, but no Radiologist interpretation will be provided. Please refer to "NOTES" tab for provider progress note.    Assessment & Plan:   Debra Hogan was seen today for back pain.  Diagnoses and all orders for this visit:  DDD (degenerative disc disease), lumbar -     Lumbar Epidural Injection; Future  Chronic bilateral low back pain with bilateral sciatica -     Lumbar Epidural Injection; Future  DDD (degenerative disc disease), cervical  Facet arthropathy, cervical  Lumbar facet joint syndrome  Chronic, continuous use of opioids  Chronic pain syndrome  Other orders -     HYDROcodone-acetaminophen (NORCO/VICODIN) 5-325 MG tablet; Take 1 tablet by mouth 2 (two) times daily as needed for moderate pain or severe pain. -     triamcinolone acetonide (KENALOG-40) injection 40 mg -      sodium chloride flush (NS) 0.9 % injection 10 mL -     ropivacaine (PF) 2 mg/mL (0.2%) (NAROPIN) injection 10 mL -     midazolam (VERSED) 5 MG/5ML injection 5 mg -     lidocaine (PF) (XYLOCAINE) 1 % injection 5 mL -     lactated ringers infusion 1,000 mL -     iohexol (OMNIPAQUE) 180 MG/ML injection 10 mL        ----------------------------------------------------------------------------------------------------------------------  Problem List Items Addressed This Visit      Unprioritized   Chronic low back pain with bilateral sciatica   Relevant Medications   HYDROcodone-acetaminophen (NORCO/VICODIN) 5-325 MG tablet   Other Relevant Orders   Lumbar Epidural Injection   Chronic pain syndrome   Relevant Medications   HYDROcodone-acetaminophen (NORCO/VICODIN) 5-325 MG tablet  Chronic, continuous use of opioids   DDD (degenerative disc disease), cervical   Relevant Medications   HYDROcodone-acetaminophen (NORCO/VICODIN) 5-325 MG tablet   DDD (degenerative disc disease), lumbar - Primary   Relevant Medications   HYDROcodone-acetaminophen (NORCO/VICODIN) 5-325 MG tablet   Other Relevant Orders   Lumbar Epidural Injection   Facet arthropathy, cervical   Lumbar facet joint syndrome   Relevant Medications   HYDROcodone-acetaminophen (NORCO/VICODIN) 5-325 MG tablet        ----------------------------------------------------------------------------------------------------------------------  1. DDD (degenerative disc disease), lumbar Based on our discussion today we will proceed with an epidural steroid injection.  We have gone over the risks and benefits of the procedure in full detail and all of her questions have been answered.  I want her to continue with strength training and physical therapy exercises.  Continue core training as well. - Lumbar Epidural Injection; Future  2. Chronic bilateral low back pain with bilateral sciatica As above with return to clinic in 1 month  for possible repeat epidural versus an L4-5 epidural - Lumbar Epidural Injection; Future  3. DDD (degenerative disc disease), cervical As above  4. Facet arthropathy, cervical Continue upper extremity stretching strengthening  5. Lumbar facet joint syndrome As above  6. Chronic, continuous use of opioids I am going to start her on hydrocodone 5 mg tablets 1 p.o. twice daily as needed for pain to see if this can function more effectively than the tramadol that she has been on previously.  She reports that she is used this in the past with good success.  We gone over the clinic policies regarding opioid utilization and our contract.  7. Chronic pain syndrome As above    ----------------------------------------------------------------------------------------------------------------------  I am having Debra Hogan start on HYDROcodone-acetaminophen. I am also having her maintain her Albuterol Sulfate, Centrum Silver Ultra Womens, cholecalciferol, diclofenac sodium, baclofen, acyclovir, Fluticasone-Salmeterol, rosuvastatin, diltiazem, nitroGLYCERIN, ondansetron, lisinopril, Xiidra, gabapentin, escitalopram, and aspirin EC. We administered triamcinolone acetonide, sodium chloride flush, ropivacaine (PF) 2 mg/mL (0.2%), midazolam, lidocaine (PF), lactated ringers, and iohexol.   Meds ordered this encounter  Medications  . HYDROcodone-acetaminophen (NORCO/VICODIN) 5-325 MG tablet    Sig: Take 1 tablet by mouth 2 (two) times daily as needed for moderate pain or severe pain.    Dispense:  60 tablet    Refill:  0  . triamcinolone acetonide (KENALOG-40) injection 40 mg  . sodium chloride flush (NS) 0.9 % injection 10 mL  . ropivacaine (PF) 2 mg/mL (0.2%) (NAROPIN) injection 10 mL  . midazolam (VERSED) 5 MG/5ML injection 5 mg  . lidocaine (PF) (XYLOCAINE) 1 % injection 5 mL  . lactated ringers infusion 1,000 mL  . iohexol (OMNIPAQUE) 180 MG/ML injection 10 mL   Patient's Medications  New  Prescriptions   HYDROCODONE-ACETAMINOPHEN (NORCO/VICODIN) 5-325 MG TABLET    Take 1 tablet by mouth 2 (two) times daily as needed for moderate pain or severe pain.  Previous Medications   ACYCLOVIR (ZOVIRAX) 400 MG TABLET    Take 1 tablet (400 mg total) by mouth daily.   ALBUTEROL SULFATE (PROAIR RESPICLICK) 161 (90 BASE) MCG/ACT AEPB    Inhale 90 mcg into the lungs every 6 (six) hours as needed (as needed for cough, wheezing).   ASPIRIN EC 81 MG TABLET    Take 81 mg by mouth daily.   BACLOFEN (LIORESAL) 10 MG TABLET    Take 1 tablet (10 mg total) by mouth 3 (three) times daily as needed for muscle spasms.   CHOLECALCIFEROL (VITAMIN D)  1000 UNITS TABLET    Take 1,000 Units by mouth daily.   DICLOFENAC SODIUM (VOLTAREN) 1 % GEL    Apply 4 g topically 4 (four) times daily.   DILTIAZEM (CARDIZEM CD) 120 MG 24 HR CAPSULE    Take 1 capsule (120 mg total) by mouth daily.   ESCITALOPRAM (LEXAPRO) 20 MG TABLET    Take 1 tablet (20 mg total) by mouth daily.   FLUTICASONE-SALMETEROL (ADVAIR DISKUS) 250-50 MCG/DOSE AEPB    Inhale 1 puff into the lungs 2 (two) times daily.   GABAPENTIN (NEURONTIN) 100 MG CAPSULE    Take 1 capsule (100 mg total) by mouth 3 (three) times daily.   LIFITEGRAST (XIIDRA) 5 % SOLN    Apply to eye 2 (two) times daily.   LISINOPRIL (ZESTRIL) 10 MG TABLET    Take 1 tablet (10 mg total) by mouth daily.   MULTIPLE VITAMINS-MINERALS (CENTRUM SILVER ULTRA WOMENS) TABS    Take 1 tablet by mouth daily.   NITROGLYCERIN (NITROSTAT) 0.4 MG SL TABLET    Place 1 tablet (0.4 mg total) under the tongue every 5 (five) minutes as needed for chest pain.   ONDANSETRON (ZOFRAN-ODT) 4 MG DISINTEGRATING TABLET    Take 1 tablet (4 mg total) by mouth every 6 (six) hours as needed for nausea or vomiting.   ROSUVASTATIN (CRESTOR) 20 MG TABLET    Take 1 tablet (20 mg total) by mouth daily.  Modified Medications   No medications on file  Discontinued Medications   No medications on file    ----------------------------------------------------------------------------------------------------------------------  Follow-up: Return in about 1 month (around 06/30/2020) for evaluation, procedure.   Procedure: L5-S1 LESI with fluoroscopic guidance and 1 with moderate sedation  NOTE: The risks, benefits, and expectations of the procedure have been discussed and explained to the patient who was understanding and in agreement with suggested treatment plan. No guarantees were made.  DESCRIPTION OF PROCEDURE: Lumbar epidural steroid injection with 5 mg IV Versed, EKG, blood pressure, pulse, and pulse oximetry monitoring. The procedure was performed with the patient in the prone position under fluoroscopic guidance.  Sterile prep x3 was initiated and I then injected subcutaneous lidocaine to the overlying L5-S1 site after its fluoroscopic identifictation.  Using strict aseptic technique, I then advanced an 18-gauge Tuohy epidural needle in the midline using interlaminar approach via loss-of-resistance to saline technique. There was negative aspiration for heme or  CSF.  I then confirmed position with both AP and Lateral fluoroscan.  2 cc of contrast dye were injected and a  total of 5 mL of Preservative-Free normal saline mixed with 40 mg of Kenalog and 1cc Ropicaine 0.2 percent were injected incrementally via the  epidurally placed needle. The needle was removed. The patient tolerated the injection well and was convalesced and discharged to home in stable condition. Should the patient have any post procedure difficulty they have been instructed on how to contact us for assistance.    Yevette Edwards, MD

## 2020-06-01 NOTE — Telephone Encounter (Signed)
Called patient for post procedure check.No answer. LVM.

## 2020-06-18 ENCOUNTER — Ambulatory Visit (INDEPENDENT_AMBULATORY_CARE_PROVIDER_SITE_OTHER): Payer: Medicare Other | Admitting: Family

## 2020-06-18 ENCOUNTER — Other Ambulatory Visit: Payer: Self-pay

## 2020-06-18 ENCOUNTER — Telehealth: Payer: Self-pay | Admitting: Family

## 2020-06-18 ENCOUNTER — Encounter: Payer: Self-pay | Admitting: Family

## 2020-06-18 ENCOUNTER — Ambulatory Visit
Admission: RE | Admit: 2020-06-18 | Discharge: 2020-06-18 | Disposition: A | Discharge status: Home / Self Care | Payer: Medicare Other | Source: Ambulatory Visit | Attending: Family | Admitting: Family

## 2020-06-18 VITALS — BP 122/78 | HR 93 | Temp 98.0°F | Ht 65.0 in | Wt 164.8 lb

## 2020-06-18 DIAGNOSIS — R1031 Right lower quadrant pain: Secondary | ICD-10-CM | POA: Diagnosis not present

## 2020-06-18 DIAGNOSIS — J449 Chronic obstructive pulmonary disease, unspecified: Secondary | ICD-10-CM | POA: Diagnosis not present

## 2020-06-18 LAB — POCT I-STAT CREATININE: Creatinine, Ser: 0.8 mg/dL (ref 0.44–1.00)

## 2020-06-18 MED ORDER — IOHEXOL 300 MG/ML  SOLN
100.0000 mL | Freq: Once | INTRAMUSCULAR | Status: AC | PRN
  Administered 2020-06-18: 100 mL via INTRAVENOUS

## 2020-06-18 NOTE — Assessment & Plan Note (Signed)
Patient nontoxic in appearance, she is afebrile.  She does have focal pain with palpation of the right lower quadrant.  As this pain is increasing in frequency, severity, we jointly agreed to pursue CT abdomen and pelvis.  Pending labs as well.

## 2020-06-18 NOTE — Progress Notes (Signed)
Patient presenting with leg and abdominal pain. States the pain is throughout multiple areas of the lower abdomen.  Patient has a history of MS and is unsure if this is just another side effect. Pain rated 7/10.

## 2020-06-18 NOTE — Patient Instructions (Signed)
Pending stat CT abdomen and pelvis.  As I mentioned, I will be calling you with any critical results.  Otherwise we will get full report on Monday.

## 2020-06-18 NOTE — Telephone Encounter (Signed)
lvm for pt to schedule 6 week follow up/bhp

## 2020-06-18 NOTE — Progress Notes (Signed)
Subjective:    Patient ID: Debra Hogan, female    DOB: 1961-10-05, 59 y.o.   MRN: [id number]  CC: [name] Shiroma Mackins is a 59 y.o. female who presents today for an acute visit.    HPI: CC: right lower abdominal pain x 3 months, becoming more severe as well as more frequent.  Pain 'hits me quite often'. Pain Stebbins present behind navel as well.  No exacerbating features. Not worse with movement or eating.  Regular formed bowl movements daily. Last BM yesteday. No blood seen in stool.  Pass gas.  No epigastric burning, vomiting, nauseated, fever, chills, hematuria. No groin pain.  She does have chronic low back pain however this pain does not feel like her typical low back pain  No h/o diverticulitis.   On norco twice per day.    H/o MS     h/o IBS- fluctuates with constipation and diarrhea H/o hysterectomy;  Follows with Dr. Pernell Dupre, pain management for chronic low back pain  Seen by cardiology Dr Kirke Corin 01/06/2020.  atypical chest pain -advised to use diltiazem, sublingual nitroglycerin; pulmonary hypertension, hyperlipidemia HISTORY:  Past Medical History:  Diagnosis Date  . Anginal pain (HCC)   . Arthritis   . COPD (chronic obstructive pulmonary disease) (HCC)   . Depression   . Dyspnea   . Fibromyalgia   . Headache   . Heat stroke 2006  . Irritable bowel syndrome   . MS (multiple sclerosis) (HCC)    Followed by Dr. Sherryll Burger   Past Surgical History:  Procedure Laterality Date  . ABDOMINAL HYSTERECTOMY     CERVIX intact; had for noncancerous reasons, endometriosis  . CHOLECYSTECTOMY N/A 01/03/2016   Procedure: LAPAROSCOPIC CHOLECYSTECTOMY WITH INTRAOPERATIVE CHOLANGIOGRAM;  Surgeon: Lattie Haw, MD;  Location: [facility] ORS;  Service: General;  Laterality: N/A;  . ENDOSCOPIC RETROGRADE CHOLANGIOPANCREATOGRAPHY (ERCP) WITH PROPOFOL N/A 01/04/2016   Procedure: ENDOSCOPIC RETROGRADE CHOLANGIOPANCREATOGRAPHY (ERCP) WITH PROPOFOL;  Surgeon: Midge Minium, MD;  Location: [facility] ENDOS[facility];   Service: Endoscopy;  Laterality: N/A;  . FOOT SURGERY  2001  . LAPAROSCOPIC ENDOMETRIOSIS FULGURATION    . RIGHT/LEFT HEART CATH AND CORONARY ANGIOGRAPHY Bilateral 09/29/2019   Procedure: RIGHT/LEFT HEART CATH AND CORONARY ANGIOGRAPHY;  Surgeon: Yvonne Kendall, MD;  Location: [facility];  Service: Cardiovascular;  Laterality: Bilateral;   Family History  Problem Relation Age of Onset  . Hypertension Mother   . Hyperlipidemia Mother   . Arthritis Mother   . Cancer Father        throat  . Heart disease Brother   . Bipolar disorder Son   . Schizophrenia Son   . Cancer Maternal Grandfather        liver  . Cancer Paternal Grandmother        liver    Allergies: Valium [diazepam] Current Outpatient Medications on File Prior to Visit  Medication Sig Dispense Refill  . acyclovir (ZOVIRAX) 400 MG tablet Take 1 tablet (400 mg total) by mouth daily. 90 tablet 3  . Albuterol Sulfate (PROAIR RESPICLICK) 108 (90 Base) MCG/ACT AEPB Inhale 90 mcg into the lungs every 6 (six) hours as needed (as needed for cough, wheezing). (Patient taking differently: Inhale 180 mcg into the lungs every 6 (six) hours as needed (as needed for cough, wheezing). ) 1 each 2  . aspirin EC 81 MG tablet Take 81 mg by mouth daily.    . cholecalciferol (VITAMIN D) 1000 units tablet Take 1,000 Units by mouth daily.    . cyclobenzaprine (  FLEXERIL) 10 MG tablet TAKE 1/2 TO 1 (ONE-HALF TO ONE) TABLET BY MOUTH TWICE DAILY AS NEEDED    . diclofenac sodium (VOLTAREN) 1 % GEL Apply 4 g topically 4 (four) times daily. 1 Tube 3  . diltiazem (CARDIZEM CD) 120 MG 24 hr capsule Take 1 capsule (120 mg total) by mouth daily. 30 capsule 11  . escitalopram (LEXAPRO) 20 MG tablet Take 1 tablet (20 mg total) by mouth daily. 90 tablet 0  . Fluticasone-Salmeterol (ADVAIR DISKUS) 250-50 MCG/DOSE AEPB Inhale 1 puff into the lungs 2 (two) times daily. 1 each 3  . gabapentin (NEURONTIN) 100 MG capsule Take 1 capsule (100 mg total) by  mouth 3 (three) times daily. 90 capsule 3  . HYDROcodone-acetaminophen (NORCO/VICODIN) 5-325 MG tablet Take 1 tablet by mouth 2 (two) times daily as needed for moderate pain or severe pain. 60 tablet 0  . lisinopril (ZESTRIL) 10 MG tablet Take 1 tablet (10 mg total) by mouth daily. 90 tablet 4  . Multiple Vitamins-Minerals (CENTRUM SILVER ULTRA WOMENS) TABS Take 1 tablet by mouth daily.    . nitroGLYCERIN (NITROSTAT) 0.4 MG SL tablet Place 1 tablet (0.4 mg total) under the tongue every 5 (five) minutes as needed for chest pain. 25 tablet 3  . ondansetron (ZOFRAN-ODT) 4 MG disintegrating tablet Take 1 tablet (4 mg total) by mouth every 6 (six) hours as needed for nausea or vomiting. 20 tablet 2  . rosuvastatin (CRESTOR) 20 MG tablet Take 1 tablet (20 mg total) by mouth daily. 90 tablet 3  . Lifitegrast (XIIDRA) 5 % SOLN Apply to eye 2 (two) times daily. (Patient not taking: Reported on 06/18/2020)     No current facility-administered medications on file prior to visit.    Social History   Tobacco Use  . Smoking status: Current Every Day Smoker    Packs/day: 1.00    Years: 43.00    Pack years: 43.00  . Smokeless tobacco: Never Used  Vaping Use  . Vaping Use: Never used  Substance Use Topics  . Alcohol use: No    Alcohol/week: 0.0 standard drinks  . Drug use: No    Review of Systems  Constitutional: Negative for chills and fever.  Respiratory: Negative for cough.   Cardiovascular: Negative for chest pain and palpitations.  Gastrointestinal: Positive for abdominal pain. Negative for abdominal distention, blood in stool, constipation, diarrhea, nausea and vomiting.  Genitourinary: Negative for dysuria.      Objective:    BP 122/78   Pulse 93   Temp 98 F (36.7 C) (Oral)   Ht 5\' 5"  (1.651 m)   Wt 164 lb 12.8 oz (74.8 kg)   SpO2 97%   BMI 27.42 kg/m    Physical Exam Vitals reviewed.  Constitutional:      Appearance: Normal appearance. She is well-developed.  Eyes:      Conjunctiva/sclera: Conjunctivae normal.  Cardiovascular:     Rate and Rhythm: Normal rate and regular rhythm.     Pulses: Normal pulses.     Heart sounds: Normal heart sounds.  Pulmonary:     Effort: Pulmonary effort is normal.     Breath sounds: Normal breath sounds. No wheezing, rhonchi or rales.  Abdominal:     General: Bowel sounds are normal. There is no distension.     Palpations: Abdomen is soft. Abdomen is not rigid. There is no fluid wave or mass.     Tenderness: There is abdominal tenderness in the right lower quadrant. There is no  guarding or rebound.  Skin:    General: Skin is warm and dry.  Neurological:     Mental Status: She is alert.  Psychiatric:        Speech: Speech normal.        Behavior: Behavior normal.        Thought Content: Thought content normal.        Assessment & Plan:   Problem List Items Addressed This Visit      Other   Abdominal pain - Primary    Patient nontoxic in appearance, she is afebrile.  She does have focal pain with palpation of the right lower quadrant.  As this pain is increasing in frequency, severity, we jointly agreed to pursue CT abdomen and pelvis.  Pending labs as well.      Relevant Orders   CT ABDOMEN PELVIS W CONTRAST   CBC with Differential/Platelet   Comprehensive metabolic panel   Hemoglobin A1c   Lipid panel   Urinalysis, Routine w reflex microscopic   Urine Culture   CBC with Differential/Platelet   Comprehensive metabolic panel   Hemoglobin A1c   Lipid panel         I have discontinued Denny D. Higginbotham's baclofen. I am also having her maintain her Albuterol Sulfate, Centrum Silver Ultra Womens, cholecalciferol, diclofenac sodium, acyclovir, Fluticasone-Salmeterol, rosuvastatin, diltiazem, nitroGLYCERIN, ondansetron, lisinopril, Xiidra, gabapentin, escitalopram, aspirin EC, HYDROcodone-acetaminophen, and cyclobenzaprine.   No orders of the defined types were placed in this encounter.   Return precautions  given.   Risks, benefits, and alternatives of the medications and treatment plan prescribed today were discussed, and patient expressed understanding.   Education regarding symptom management and diagnosis given to patient on AVS.  Continue to follow with Allegra Grana, FNP for routine health maintenance.   Audie D Scioneaux and I agreed with plan.   Rennie Plowman, FNP

## 2020-06-19 LAB — CBC WITH DIFFERENTIAL/PLATELET
Absolute Monocytes: 650 cells/uL (ref 200–950)
Basophils Absolute: 27 cells/uL (ref 0–200)
Basophils Relative: 0.4 %
Eosinophils Absolute: 87 cells/uL (ref 15–500)
Eosinophils Relative: 1.3 %
HCT: 41.3 % (ref 35.0–45.0)
Hemoglobin: 14 g/dL (ref 11.7–15.5)
Lymphs Abs: 945 cells/uL (ref 850–3900)
MCH: 32.3 pg (ref 27.0–33.0)
MCHC: 33.9 g/dL (ref 32.0–36.0)
MCV: 95.2 fL (ref 80.0–100.0)
MPV: 10.6 fL (ref 7.5–12.5)
Monocytes Relative: 9.7 %
Neutro Abs: 4992 cells/uL (ref 1500–7800)
Neutrophils Relative %: 74.5 %
Platelets: 207 10*3/uL (ref 140–400)
RBC: 4.34 10*6/uL (ref 3.80–5.10)
RDW: 12.2 % (ref 11.0–15.0)
Total Lymphocyte: 14.1 %
WBC: 6.7 10*3/uL (ref 3.8–10.8)

## 2020-06-19 LAB — URINE CULTURE
MICRO NUMBER:: 10635603
Result:: NO GROWTH
SPECIMEN QUALITY:: ADEQUATE

## 2020-06-19 LAB — URINALYSIS, ROUTINE W REFLEX MICROSCOPIC
Bilirubin Urine: NEGATIVE
Glucose, UA: NEGATIVE
Hgb urine dipstick: NEGATIVE
Ketones, ur: NEGATIVE
Leukocytes,Ua: NEGATIVE
Nitrite: NEGATIVE
Protein, ur: NEGATIVE
Specific Gravity, Urine: 1.009 (ref 1.001–1.03)
pH: 6 (ref 5.0–8.0)

## 2020-06-19 LAB — COMPREHENSIVE METABOLIC PANEL
AG Ratio: 1.8 (calc) (ref 1.0–2.5)
ALT: 26 U/L (ref 6–29)
AST: 20 U/L (ref 10–35)
Albumin: 4.1 g/dL (ref 3.6–5.1)
Alkaline phosphatase (APISO): 74 U/L (ref 37–153)
BUN: 19 mg/dL (ref 7–25)
CO2: 32 mmol/L (ref 20–32)
Calcium: 10 mg/dL (ref 8.6–10.4)
Chloride: 100 mmol/L (ref 98–110)
Creat: 0.85 mg/dL (ref 0.50–0.99)
Globulin: 2.3 g/dL (calc) (ref 1.9–3.7)
Glucose, Bld: 81 mg/dL (ref 65–99)
Potassium: 4.8 mmol/L (ref 3.5–5.3)
Sodium: 138 mmol/L (ref 135–146)
Total Bilirubin: 0.4 mg/dL (ref 0.2–1.2)
Total Protein: 6.4 g/dL (ref 6.1–8.1)

## 2020-06-19 LAB — LIPID PANEL
Cholesterol: 173 mg/dL (ref <200)
HDL: 54 mg/dL (ref >=50)
LDL Cholesterol (Calc): 91 mg/dL (calc)
Non-HDL Cholesterol (Calc): 119 mg/dL (calc) (ref <130)
Total CHOL/HDL Ratio: 3.2 (calc) (ref <5.0)
Triglycerides: 185 mg/dL — ABNORMAL HIGH (ref <150)

## 2020-06-19 LAB — HEMOGLOBIN A1C
Hgb A1c MFr Bld: 5 % of total Hgb (ref <5.7)
Mean Plasma Glucose: 97 (calc)
eAG (mmol/L): 5.4 (calc)

## 2020-06-21 ENCOUNTER — Telehealth: Payer: Self-pay | Admitting: Family

## 2020-06-21 NOTE — Telephone Encounter (Signed)
Pt returned call

## 2020-06-22 ENCOUNTER — Other Ambulatory Visit: Payer: Self-pay | Admitting: Family

## 2020-06-22 ENCOUNTER — Telehealth: Payer: Self-pay

## 2020-06-22 DIAGNOSIS — N281 Cyst of kidney, acquired: Secondary | ICD-10-CM

## 2020-06-22 NOTE — Telephone Encounter (Signed)
Called patient and left a voicmeail to call back on 06/21/20. Was unable to result on date due to activity in the chart at another location.

## 2020-07-01 ENCOUNTER — Ambulatory Visit: Payer: Medicare Other

## 2020-07-05 ENCOUNTER — Telehealth: Payer: Self-pay

## 2020-07-05 DIAGNOSIS — Z122 Encounter for screening for malignant neoplasm of respiratory organs: Secondary | ICD-10-CM

## 2020-07-05 DIAGNOSIS — Z87891 Personal history of nicotine dependence: Secondary | ICD-10-CM

## 2020-07-05 NOTE — Telephone Encounter (Signed)
Message left notifying patient that it is time to schedule the low dose lung cancer screening CT scan.  Instructed patient to return call to Shawn Perkins at [phone] to verify information prior to CT scan being scheduled.    

## 2020-07-06 ENCOUNTER — Ambulatory Visit: Payer: Medicare Other | Admitting: Cardiovascular Disease

## 2020-07-09 ENCOUNTER — Other Ambulatory Visit: Payer: Self-pay

## 2020-07-09 ENCOUNTER — Ambulatory Visit
Admission: RE | Admit: 2020-07-09 | Discharge: 2020-07-09 | Disposition: A | Discharge status: Home / Self Care | Payer: Medicare Other | Source: Ambulatory Visit | Attending: Family | Admitting: Family

## 2020-07-09 DIAGNOSIS — N281 Cyst of kidney, acquired: Secondary | ICD-10-CM | POA: Insufficient documentation

## 2020-07-12 ENCOUNTER — Encounter: Payer: Self-pay | Admitting: Family

## 2020-07-12 ENCOUNTER — Ambulatory Visit: Payer: Medicare Other | Admitting: Anesthesiology

## 2020-07-12 DIAGNOSIS — M545 Low back pain, unspecified: Secondary | ICD-10-CM

## 2020-07-12 MED ORDER — GABAPENTIN 100 MG PO CAPS: 100.0000 mg | ORAL_CAPSULE | Freq: Three times a day (TID) | ORAL | 3 refills | Status: DC

## 2020-07-13 ENCOUNTER — Encounter: Payer: Self-pay | Admitting: Family

## 2020-07-14 NOTE — Addendum Note (Signed)
Addended by: Jonne Ply on: 07/14/2020 11:26 AM   Modules accepted: Orders

## 2020-07-14 NOTE — Telephone Encounter (Signed)
Patient has been notified that annual lung cancer screening low dose CT scan is due currently or will be in near future. Confirmed that patient is within the age range of 55-77, and asymptomatic, (no signs or symptoms of lung cancer). Patient denies illness that would prevent curative treatment for lung cancer if found. Verified smoking history, (current 45 pack year). The shared decision making visit was done 04/30/18. Patient is agreeable for CT scan being scheduled.

## 2020-07-16 ENCOUNTER — Ambulatory Visit: Payer: Medicare Other | Admitting: Family

## 2020-07-18 DIAGNOSIS — J449 Chronic obstructive pulmonary disease, unspecified: Secondary | ICD-10-CM | POA: Diagnosis not present

## 2020-07-26 ENCOUNTER — Other Ambulatory Visit: Payer: Self-pay

## 2020-07-26 ENCOUNTER — Telehealth: Payer: Medicare Other | Admitting: Family

## 2020-07-26 ENCOUNTER — Encounter: Payer: Self-pay | Admitting: Anesthesiology

## 2020-07-26 ENCOUNTER — Ambulatory Visit
Admission: RE | Admit: 2020-07-26 | Discharge: 2020-07-26 | Disposition: A | Discharge status: Home / Self Care | Payer: Medicare Other | Source: Ambulatory Visit | Attending: Anesthesiology | Admitting: Anesthesiology

## 2020-07-26 ENCOUNTER — Ambulatory Visit (HOSPITAL_BASED_OUTPATIENT_CLINIC_OR_DEPARTMENT_OTHER): Payer: Medicare Other | Admitting: Anesthesiology

## 2020-07-26 ENCOUNTER — Other Ambulatory Visit: Payer: Self-pay | Admitting: Anesthesiology

## 2020-07-26 VITALS — BP 123/69 | HR 105 | Temp 98.0°F | Resp 18 | Ht 65.0 in | Wt 152.0 lb

## 2020-07-26 DIAGNOSIS — M5136 Other intervertebral disc degeneration, lumbar region: Secondary | ICD-10-CM | POA: Insufficient documentation

## 2020-07-26 DIAGNOSIS — M5442 Lumbago with sciatica, left side: Secondary | ICD-10-CM | POA: Insufficient documentation

## 2020-07-26 DIAGNOSIS — R52 Pain, unspecified: Secondary | ICD-10-CM | POA: Insufficient documentation

## 2020-07-26 DIAGNOSIS — G894 Chronic pain syndrome: Secondary | ICD-10-CM | POA: Diagnosis not present

## 2020-07-26 DIAGNOSIS — M5030 Other cervical disc degeneration, unspecified cervical region: Secondary | ICD-10-CM | POA: Insufficient documentation

## 2020-07-26 DIAGNOSIS — G8929 Other chronic pain: Secondary | ICD-10-CM | POA: Diagnosis not present

## 2020-07-26 DIAGNOSIS — F1190 Opioid use, unspecified, uncomplicated: Secondary | ICD-10-CM | POA: Diagnosis present

## 2020-07-26 DIAGNOSIS — M5441 Lumbago with sciatica, right side: Secondary | ICD-10-CM | POA: Diagnosis not present

## 2020-07-26 DIAGNOSIS — M47812 Spondylosis without myelopathy or radiculopathy, cervical region: Secondary | ICD-10-CM | POA: Diagnosis not present

## 2020-07-26 MED ORDER — MIDAZOLAM HCL 2 MG/2ML IJ SOLN
5.0000 mg | Freq: Once | INTRAMUSCULAR | Status: AC
  Administered 2020-07-26: 2 mg via INTRAVENOUS
  Administered 2020-07-26: 3 mg via INTRAVENOUS
  Filled 2020-07-26: qty 5

## 2020-07-26 MED ORDER — IOHEXOL 180 MG/ML  SOLN
INTRAMUSCULAR | Status: AC
  Filled 2020-07-26: qty 20

## 2020-07-26 MED ORDER — ROPIVACAINE HCL 2 MG/ML IJ SOLN
INTRAMUSCULAR | Status: AC
  Filled 2020-07-26: qty 10

## 2020-07-26 MED ORDER — IOHEXOL 180 MG/ML  SOLN
10.0000 mL | Freq: Once | INTRAMUSCULAR | Status: AC | PRN
  Administered 2020-07-26: 10 mL via EPIDURAL

## 2020-07-26 MED ORDER — LIDOCAINE HCL (PF) 1 % IJ SOLN
5.0000 mL | Freq: Once | INTRAMUSCULAR | Status: AC
  Administered 2020-07-26: 5 mL via SUBCUTANEOUS

## 2020-07-26 MED ORDER — HYDROCODONE-ACETAMINOPHEN 5-325 MG PO TABS: 1.0000 | ORAL_TABLET | Freq: Four times a day (QID) | ORAL | 0 refills | Status: AC | PRN

## 2020-07-26 MED ORDER — FENTANYL CITRATE (PF) 100 MCG/2ML IJ SOLN: 100.0000 ug | Freq: Once | INTRAMUSCULAR | Status: DC

## 2020-07-26 MED ORDER — MIDAZOLAM HCL 5 MG/5ML IJ SOLN
INTRAMUSCULAR | Status: AC
  Filled 2020-07-26: qty 5

## 2020-07-26 MED ORDER — ROPIVACAINE HCL 2 MG/ML IJ SOLN: 10.0000 mL | Freq: Once | INTRAMUSCULAR | Status: DC

## 2020-07-26 MED ORDER — TRIAMCINOLONE ACETONIDE 40 MG/ML IJ SUSP
INTRAMUSCULAR | Status: AC
  Filled 2020-07-26: qty 1

## 2020-07-26 MED ORDER — TRIAMCINOLONE ACETONIDE 40 MG/ML IJ SUSP
40.0000 mg | Freq: Once | INTRAMUSCULAR | Status: AC
  Administered 2020-07-26: 40 mg

## 2020-07-26 MED ORDER — LIDOCAINE HCL (PF) 1 % IJ SOLN
INTRAMUSCULAR | Status: AC
  Filled 2020-07-26: qty 5

## 2020-07-26 MED ORDER — SODIUM CHLORIDE 0.9% FLUSH
10.0000 mL | Freq: Once | INTRAVENOUS | Status: AC
  Administered 2020-07-26: 10 mL

## 2020-07-26 MED ORDER — SODIUM CHLORIDE (PF) 0.9 % IJ SOLN
INTRAMUSCULAR | Status: AC
  Filled 2020-07-26: qty 10

## 2020-07-26 NOTE — Progress Notes (Signed)
Safety precautions to be maintained throughout the outpatient stay will include: orient to surroundings, keep bed in low position, maintain call bell within reach at all times, provide assistance with transfer out of bed and ambulation.  

## 2020-07-27 ENCOUNTER — Encounter: Payer: Self-pay | Admitting: Anesthesiology

## 2020-07-27 NOTE — Progress Notes (Signed)
Subjective:  Patient ID: Debra Hogan, female    DOB: March 05, 1961  Age: 59 y.o. MRN: [id number]  CC: Back Pain (lower)   Procedure: L4-5 epidural steroid under fluoroscopic guidance moderate sedation  HPI Debra Hogan presents for reevaluation.  She was seen a few months ago and had an epidural for her low back pain and lower extremity pain.  She responded favorably with a 75% improvement lasting 3 to 4 weeks.  She reports that she has had some recurrence of her posterior lateral leg pain similar to what she had experienced in the past.  No new changes in lower extremity strength or function are noted.  Overall she feels like her lower extremity pain is better but she is having recurrent spasming in her low back similar to what she had reported previously.  The quality characteristic distribution of this pain are stable in nature.  She still taking her medications as prescribed  Outpatient Medications Prior to Visit  Medication Sig Dispense Refill  . acyclovir (ZOVIRAX) 400 MG tablet Take 1 tablet (400 mg total) by mouth daily. 90 tablet 3  . Albuterol Sulfate (PROAIR RESPICLICK) 108 (90 Base) MCG/ACT AEPB Inhale 90 mcg into the lungs every 6 (six) hours as needed (as needed for cough, wheezing). (Patient taking differently: Inhale 180 mcg into the lungs every 6 (six) hours as needed (as needed for cough, wheezing). ) 1 each 2  . aspirin EC 81 MG tablet Take 81 mg by mouth daily.    . cholecalciferol (VITAMIN D) 1000 units tablet Take 1,000 Units by mouth daily.    . cyclobenzaprine (FLEXERIL) 10 MG tablet TAKE 1/2 TO 1 (ONE-HALF TO ONE) TABLET BY MOUTH TWICE DAILY AS NEEDED    . diclofenac sodium (VOLTAREN) 1 % GEL Apply 4 g topically 4 (four) times daily. 1 Tube 3  . diltiazem (CARDIZEM CD) 120 MG 24 hr capsule Take 1 capsule (120 mg total) by mouth daily. 30 capsule 11  . escitalopram (LEXAPRO) 20 MG tablet Take 1 tablet (20 mg total) by mouth daily. 90 tablet 0  . Fluticasone-Salmeterol  (ADVAIR DISKUS) 250-50 MCG/DOSE AEPB Inhale 1 puff into the lungs 2 (two) times daily. 1 each 3  . gabapentin (NEURONTIN) 100 MG capsule Take 1 capsule (100 mg total) by mouth 3 (three) times daily. 90 capsule 3  . lisinopril (ZESTRIL) 10 MG tablet Take 1 tablet (10 mg total) by mouth daily. 90 tablet 4  . Multiple Vitamins-Minerals (CENTRUM SILVER ULTRA WOMENS) TABS Take 1 tablet by mouth daily.    . nitroGLYCERIN (NITROSTAT) 0.4 MG SL tablet Place 1 tablet (0.4 mg total) under the tongue every 5 (five) minutes as needed for chest pain. 25 tablet 3  . ondansetron (ZOFRAN-ODT) 4 MG disintegrating tablet Take 1 tablet (4 mg total) by mouth every 6 (six) hours as needed for nausea or vomiting. 20 tablet 2  . rosuvastatin (CRESTOR) 20 MG tablet Take 1 tablet (20 mg total) by mouth daily. 90 tablet 3  . Lifitegrast (XIIDRA) 5 % SOLN Apply to eye 2 (two) times daily. (Patient not taking: Reported on 06/18/2020)     No facility-administered medications prior to visit.    Review of Systems CNS: No confusion or sedation Cardiac: No angina or palpitations GI: No abdominal pain or constipation Constitutional: No nausea vomiting fevers or chills  Objective:  BP 123/69   Pulse (!) 105   Temp 98 F (36.7 C)   Resp 18   Ht  (  1.651 m)   Wt 152 lb (68.9 kg)   SpO2 96%   BMI 25.29 kg/m    BP Readings from Last 3 Encounters:  07/26/20 123/69  06/18/20 122/78  05/31/20 134/72     Wt Readings from Last 3 Encounters:  07/26/20 152 lb (68.9 kg)  06/18/20 164 lb 12.8 oz (74.8 kg)  05/31/20 165 lb (74.8 kg)     Physical Exam Pt is alert and oriented PERRL EOMI HEART IS RRR no murmur or rub LCTA no wheezing or rales MUSCULOSKELETAL continues to walk with an antalgic gait but muscle tone and bulk is at baseline.  Positive straight leg raise on the left side.  Labs  Lab Results  Component Value Date   HGBA1C 5.0 06/18/2020   HGBA1C 5.0 07/09/2019   HGBA1C 5.2 05/08/2018   Lab  Results  Component Value Date   LDLCALC 91 06/18/2020   CREATININE 0.80 06/18/2020    -------------------------------------------------------------------------------------------------------------------- Lab Results  Component Value Date   WBC 6.7 06/18/2020   HGB 14.0 06/18/2020   HCT 41.3 06/18/2020   PLT 207 06/18/2020   GLUCOSE 81 06/18/2020   CHOL 173 06/18/2020   TRIG 185 (H) 06/18/2020   HDL 54 06/18/2020   LDLDIRECT 108.0 07/09/2019   LDLCALC 91 06/18/2020   ALT 26 06/18/2020   AST 20 06/18/2020   NA 138 06/18/2020   K 4.8 06/18/2020   CL 100 06/18/2020   CREATININE 0.80 06/18/2020   BUN 19 06/18/2020   CO2 32 06/18/2020   TSH 5.40 (H) 07/09/2019   INR 1.0 10/22/2015   HGBA1C 5.0 06/18/2020    --------------------------------------------------------------------------------------------------------------------- DG PAIN CLINIC C-ARM 1-60 MIN NO REPORT  Result Date: 07/26/2020 Fluoro was used, but no Radiologist interpretation will be provided. Please refer to "NOTES" tab for provider progress note.    Assessment & Plan:   Elbia was seen today for back pain.  Diagnoses and all orders for this visit:  DDD (degenerative disc disease), lumbar  Chronic bilateral low back pain with bilateral sciatica  DDD (degenerative disc disease), cervical  Facet arthropathy, cervical  Chronic, continuous use of opioids  Chronic pain syndrome  Other orders -     triamcinolone acetonide (KENALOG-40) injection 40 mg -     sodium chloride flush (NS) 0.9 % injection 10 mL -     ropivacaine (PF) 2 mg/mL (0.2%) (NAROPIN) injection 10 mL -     midazolam (VERSED) injection 5 mg -     lidocaine (PF) (XYLOCAINE) 1 % injection 5 mL -     iohexol (OMNIPAQUE) 180 MG/ML injection 10 mL -     fentaNYL (SUBLIMAZE) injection 100 mcg -     HYDROcodone-acetaminophen (NORCO/VICODIN) 5-325 MG tablet; Take 1 tablet by mouth every 6 (six) hours as needed for moderate pain or severe  pain.        ----------------------------------------------------------------------------------------------------------------------  Problem List Items Addressed This Visit      Unprioritized   Chronic pain syndrome   Relevant Medications   ropivacaine (PF) 2 mg/mL (0.2%) (NAROPIN) injection 10 mL   fentaNYL (SUBLIMAZE) injection 100 mcg   HYDROcodone-acetaminophen (NORCO/VICODIN) 5-325 MG tablet   Chronic, continuous use of opioids   DDD (degenerative disc disease), cervical   Relevant Medications   fentaNYL (SUBLIMAZE) injection 100 mcg   HYDROcodone-acetaminophen (NORCO/VICODIN) 5-325 MG tablet   DDD (degenerative disc disease), lumbar - Primary   Relevant Medications   fentaNYL (SUBLIMAZE) injection 100 mcg   HYDROcodone-acetaminophen (NORCO/VICODIN) 5-325 MG tablet  Facet arthropathy, cervical   Low back pain   Relevant Medications   fentaNYL (SUBLIMAZE) injection 100 mcg   HYDROcodone-acetaminophen (NORCO/VICODIN) 5-325 MG tablet        ----------------------------------------------------------------------------------------------------------------------  1. DDD (degenerative disc disease), lumbar We will proceed with a second epidural today.  The risks and benefits are once again reviewed and all questions answered.  I want her to continue with the stretching strengthening exercises as prescribed and continue with muscle relaxants for the muscle spasms.  We will schedule return to clinic in approximately 2 months for possible repeat epidural at that time.  Ultimately the patient Pietila require recurrent physical therapy to work with low back spasming.  2. Chronic bilateral low back pain with bilateral sciatica As above  3. DDD (degenerative disc disease), cervical As above  4. Facet arthropathy, cervical Continue core strengthening  5. Chronic, continuous use of opioids I have reviewed the [facility] practitioner database information is appropriate for refill  today this will be done for Vicodin twice daily dosing.  6. Chronic pain syndrome As above    ----------------------------------------------------------------------------------------------------------------------  I am having Cincere D. Paterson start on HYDROcodone-acetaminophen. I am also having her maintain her Albuterol Sulfate, Centrum Silver Ultra Womens, cholecalciferol, diclofenac sodium, acyclovir, Fluticasone-Salmeterol, rosuvastatin, diltiazem, nitroGLYCERIN, ondansetron, lisinopril, Xiidra, escitalopram, aspirin EC, cyclobenzaprine, and gabapentin. We administered triamcinolone acetonide, sodium chloride flush, midazolam, lidocaine (PF), and iohexol.   Meds ordered this encounter  Medications  . triamcinolone acetonide (KENALOG-40) injection 40 mg  . sodium chloride flush (NS) 0.9 % injection 10 mL  . ropivacaine (PF) 2 mg/mL (0.2%) (NAROPIN) injection 10 mL  . midazolam (VERSED) injection 5 mg  . lidocaine (PF) (XYLOCAINE) 1 % injection 5 mL  . iohexol (OMNIPAQUE) 180 MG/ML injection 10 mL  . fentaNYL (SUBLIMAZE) injection 100 mcg  . HYDROcodone-acetaminophen (NORCO/VICODIN) 5-325 MG tablet    Sig: Take 1 tablet by mouth every 6 (six) hours as needed for moderate pain or severe pain.    Dispense:  90 tablet    Refill:  0   Patient's Medications  New Prescriptions   HYDROCODONE-ACETAMINOPHEN (NORCO/VICODIN) 5-325 MG TABLET    Take 1 tablet by mouth every 6 (six) hours as needed for moderate pain or severe pain.  Previous Medications   ACYCLOVIR (ZOVIRAX) 400 MG TABLET    Take 1 tablet (400 mg total) by mouth daily.   ALBUTEROL SULFATE (PROAIR RESPICLICK) 108 (90 BASE) MCG/ACT AEPB    Inhale 90 mcg into the lungs every 6 (six) hours as needed (as needed for cough, wheezing).   ASPIRIN EC 81 MG TABLET    Take 81 mg by mouth daily.   CHOLECALCIFEROL (VITAMIN D) 1000 UNITS TABLET    Take 1,000 Units by mouth daily.   CYCLOBENZAPRINE (FLEXERIL) 10 MG TABLET    TAKE 1/2 TO 1  (ONE-HALF TO ONE) TABLET BY MOUTH TWICE DAILY AS NEEDED   DICLOFENAC SODIUM (VOLTAREN) 1 % GEL    Apply 4 g topically 4 (four) times daily.   DILTIAZEM (CARDIZEM CD) 120 MG 24 HR CAPSULE    Take 1 capsule (120 mg total) by mouth daily.   ESCITALOPRAM (LEXAPRO) 20 MG TABLET    Take 1 tablet (20 mg total) by mouth daily.   FLUTICASONE-SALMETEROL (ADVAIR DISKUS) 250-50 MCG/DOSE AEPB    Inhale 1 puff into the lungs 2 (two) times daily.   GABAPENTIN (NEURONTIN) 100 MG CAPSULE    Take 1 capsule (100 mg total) by mouth 3 (three) times daily.  LIFITEGRAST (XIIDRA) 5 % SOLN    Apply to eye 2 (two) times daily.   LISINOPRIL (ZESTRIL) 10 MG TABLET    Take 1 tablet (10 mg total) by mouth daily.   MULTIPLE VITAMINS-MINERALS (CENTRUM SILVER ULTRA WOMENS) TABS    Take 1 tablet by mouth daily.   NITROGLYCERIN (NITROSTAT) 0.4 MG SL TABLET    Place 1 tablet (0.4 mg total) under the tongue every 5 (five) minutes as needed for chest pain.   ONDANSETRON (ZOFRAN-ODT) 4 MG DISINTEGRATING TABLET    Take 1 tablet (4 mg total) by mouth every 6 (six) hours as needed for nausea or vomiting.   ROSUVASTATIN (CRESTOR) 20 MG TABLET    Take 1 tablet (20 mg total) by mouth daily.  Modified Medications   No medications on file  Discontinued Medications   No medications on file   ----------------------------------------------------------------------------------------------------------------------  Follow-up: Return in about 2 months (around 09/25/2020) for procedure, evaluation.   Procedure: L4-L5 LESI with fluoroscopic guidance and with moderate sedation  NOTE: The risks, benefits, and expectations of the procedure have been discussed and explained to the patient who was understanding and in agreement with suggested treatment plan. No guarantees were made.  DESCRIPTION OF PROCEDURE: Lumbar epidural steroid injection with 5 mg IV Versed, EKG, blood pressure, pulse, and pulse oximetry monitoring. The procedure was performed  with the patient in the prone position under fluoroscopic guidance.  Sterile prep x3 was initiated and I then injected subcutaneous lidocaine to the overlying L4-L5 site after its fluoroscopic identifictation.  Using strict aseptic technique, I then advanced an 18-gauge Tuohy epidural needle in the midline using interlaminar approach via loss-of-resistance to saline technique. There was negative aspiration for heme or  CSF.  I then confirmed position with both AP and Lateral fluoroscan.  2 cc of contrast dye were injected and a  total of 5 mL of Preservative-Free normal saline mixed with 40 mg of Kenalog and 1cc Ropicaine 0.2 percent were injected incrementally via the  epidurally placed needle. The needle was removed. The patient tolerated the injection well and was convalesced and discharged to home in stable condition. Should the patient have any post procedure difficulty they have been instructed on how to contact us for assistance.    Yevette Edwards, MD

## 2020-07-28 ENCOUNTER — Telehealth (INDEPENDENT_AMBULATORY_CARE_PROVIDER_SITE_OTHER): Payer: Medicare Other | Admitting: Family

## 2020-07-28 ENCOUNTER — Encounter: Payer: Self-pay | Admitting: Family

## 2020-07-28 VITALS — BP 143/82 | Ht 65.0 in | Wt 150.0 lb

## 2020-07-28 DIAGNOSIS — R109 Unspecified abdominal pain: Secondary | ICD-10-CM

## 2020-07-28 DIAGNOSIS — F419 Anxiety disorder, unspecified: Secondary | ICD-10-CM | POA: Diagnosis not present

## 2020-07-28 MED ORDER — ALPRAZOLAM 0.25 MG PO TABS: 0.2500 mg | ORAL_TABLET | Freq: Two times a day (BID) | ORAL | 1 refills | Status: DC | PRN

## 2020-07-28 NOTE — Assessment & Plan Note (Signed)
Resolved .  Patient declines further evaluation at this time.  °

## 2020-07-28 NOTE — Patient Instructions (Addendum)
Wean off and stop gabapentin as not sure if helping and it has a sedating properties with the medicines you are taking.   I have sent a prescription for Xanax 0.25 mg to be taken up to twice a day as needed.  As discussed, if you find yourself you have this medication daily, I would like to discuss changing the Lexapro.  I am not prescribing this medication with the intent that you use it every day .  Please store in a safe location. When you come in the next time, you will be asked to fill out a controlled substance contract and provide a urine specimen.  Stay safe!

## 2020-07-28 NOTE — Progress Notes (Signed)
Virtual Visit via Video Note  I connected with@  on 07/28/20 at  4:00 PM EDT by a video enabled telemedicine application and verified that I am speaking with the correct person using two identifiers.  Location patient: home Location provider:work  Persons participating in the virtual visit: patient, provider  I discussed the limitations of evaluation and management by telemedicine and the availability of in person appointments. The patient expressed understanding and agreed to proceed. Interactive audio and video telecommunications were attempted between this provider and patient, however failed, due to patient having technical difficulties or patient did not have access to video capability.  We continued and completed visit with audio only.    HPI:  Complains of shaking all over for several years, unchanged.  'States that from MS.' Anxiety provokes as well. Occurs 2-3 times per week. If feeling anxious or even a 'serious discussion', she will start feeling shakey. Has trouble getting words out when this happens. No a/w cp, sob, left arm numbness, HA, vision changes, diaphoresis, palpitations.  Has been on xanax in the past for this.no h/o overdose.  No alcohol use.   Compliant with lexapro and thinks helpful for depression. No si/hi.   Right lower quadrant abdominal pain has resolved.   Follows with Dr. Pernell Dupre for degenerative disc disease; had epidural injection in lower back 2 days . Prescribed norco 4 times per day as needed. Also takes gabapentin TID however not sure if takes for back pain or fibromyalgia. Nor is she sure if helpful.  Takes flexeril 10mg  once ,maybe twice per week prn. No longer on tramadol.   Doesn't drive.   Dont feel sedating medication regimen.    ROS: See pertinent positives and negatives per HPI.  Past Medical History:  Diagnosis Date  . Anginal pain (HCC)   . Arthritis   . COPD (chronic obstructive pulmonary disease) (HCC)   . Depression   . Dyspnea    . Fibromyalgia   . Headache   . Heat stroke 2006  . Irritable bowel syndrome   . MS (multiple sclerosis) (HCC)    Followed by Dr. 2007    Past Surgical History:  Procedure Laterality Date  . ABDOMINAL HYSTERECTOMY     CERVIX intact; had for noncancerous reasons, endometriosis  . CHOLECYSTECTOMY N/A 01/03/2016   Procedure: LAPAROSCOPIC CHOLECYSTECTOMY WITH INTRAOPERATIVE CHOLANGIOGRAM;  Surgeon: 03/02/2016, MD;  Location: [facility] ORS;  Service: General;  Laterality: N/A;  . ENDOSCOPIC RETROGRADE CHOLANGIOPANCREATOGRAPHY (ERCP) WITH PROPOFOL N/A 01/04/2016   Procedure: ENDOSCOPIC RETROGRADE CHOLANGIOPANCREATOGRAPHY (ERCP) WITH PROPOFOL;  Surgeon: 03/03/2016, MD;  Location: [facility] ENDOS[facility];  Service: Endoscopy;  Laterality: N/A;  . FOOT SURGERY  2001  . LAPAROSCOPIC ENDOMETRIOSIS FULGURATION    . RIGHT/LEFT HEART CATH AND CORONARY ANGIOGRAPHY Bilateral 09/29/2019   Procedure: RIGHT/LEFT HEART CATH AND CORONARY ANGIOGRAPHY;  Surgeon: 11/29/2019, MD;  Location: [facility];  Service: Cardiovascular;  Laterality: Bilateral;    Family History  Problem Relation Age of Onset  . Hypertension Mother   . Hyperlipidemia Mother   . Arthritis Mother   . Cancer Father        throat  . Heart disease Brother   . Bipolar disorder Son   . Schizophrenia Son   . Cancer Maternal Grandfather        liver  . Cancer Paternal Grandmother        liver       Current Outpatient Medications:  .  acyclovir (ZOVIRAX) 400 MG tablet, Take 1  tablet (400 mg total) by mouth daily., Disp: 90 tablet, Rfl: 3 .  Albuterol Sulfate (PROAIR RESPICLICK) 108 (90 Base) MCG/ACT AEPB, Inhale 90 mcg into the lungs every 6 (six) hours as needed (as needed for cough, wheezing). (Patient taking differently: Inhale 180 mcg into the lungs every 6 (six) hours as needed (as needed for cough, wheezing). ), Disp: 1 each, Rfl: 2 .  aspirin EC 81 MG tablet, Take 81 mg by mouth daily., Disp: , Rfl:  .  cholecalciferol  (VITAMIN D) 1000 units tablet, Take 1,000 Units by mouth daily., Disp: , Rfl:  .  cyclobenzaprine (FLEXERIL) 10 MG tablet, TAKE 1/2 TO 1 (ONE-HALF TO ONE) TABLET BY MOUTH TWICE DAILY AS NEEDED, Disp: , Rfl:  .  diclofenac sodium (VOLTAREN) 1 % GEL, Apply 4 g topically 4 (four) times daily., Disp: 1 Tube, Rfl: 3 .  diltiazem (CARDIZEM CD) 120 MG 24 hr capsule, Take 1 capsule (120 mg total) by mouth daily., Disp: 30 capsule, Rfl: 11 .  escitalopram (LEXAPRO) 20 MG tablet, Take 1 tablet (20 mg total) by mouth daily., Disp: 90 tablet, Rfl: 0 .  Fluticasone-Salmeterol (ADVAIR DISKUS) 250-50 MCG/DOSE AEPB, Inhale 1 puff into the lungs 2 (two) times daily., Disp: 1 each, Rfl: 3 .  HYDROcodone-acetaminophen (NORCO/VICODIN) 5-325 MG tablet, Take 1 tablet by mouth every 6 (six) hours as needed for moderate pain or severe pain., Disp: 90 tablet, Rfl: 0 .  Lifitegrast (XIIDRA) 5 % SOLN, Apply to eye 2 (two) times daily. , Disp: , Rfl:  .  lisinopril (ZESTRIL) 10 MG tablet, Take 1 tablet (10 mg total) by mouth daily., Disp: 90 tablet, Rfl: 4 .  Multiple Vitamins-Minerals (CENTRUM SILVER ULTRA WOMENS) TABS, Take 1 tablet by mouth daily., Disp: , Rfl:  .  nitroGLYCERIN (NITROSTAT) 0.4 MG SL tablet, Place 1 tablet (0.4 mg total) under the tongue every 5 (five) minutes as needed for chest pain., Disp: 25 tablet, Rfl: 3 .  ondansetron (ZOFRAN-ODT) 4 MG disintegrating tablet, Take 1 tablet (4 mg total) by mouth every 6 (six) hours as needed for nausea or vomiting., Disp: 20 tablet, Rfl: 2 .  rosuvastatin (CRESTOR) 20 MG tablet, Take 1 tablet (20 mg total) by mouth daily., Disp: 90 tablet, Rfl: 3 .  ALPRAZolam (XANAX) 0.25 MG tablet, Take 1 tablet (0.25 mg total) by mouth 2 (two) times daily as needed for anxiety., Disp: 20 tablet, Rfl: 1  EXAM:  VITALS per patient if applicable:  GENERAL: alert, oriented  PSYCH/NEURO: pleasant and cooperative ASSESSMENT AND PLAN:  Discussed the following assessment and  plan:  Anxiety - Plan: ALPRAZolam (XANAX) 0.25 MG tablet  Abdominal pain, unspecified abdominal location Problem List Items Addressed This Visit      Other   Abdominal pain    Resolved.  Patient declines further evaluation at this time      Anxiety and depression - Primary    Depression overall controlled.  Patient had long discussion regards to "shaking all over" and suspect that this is related to uncontrolled anxiety.  I think that in the setting of multiple sclerosis at times and trouble finding her words, I suspect this exacerbates underlying anxiety.  She has been on Xanax in the past.  Long discussion regards this and how it is a controlled substance and can be habit-forming.  Counseled her on the risk of sedation, respiratory depression in the setting of COPD and the multiple sedating medications that she is on, notably Vicodin.  she understands not to  take this medication with Flexeril, alcohol.  We will wean off and discontinue gabapentin as this is sedating and patient does not feel it is completely helpful.  We agreed to start a very low-dose and to take as needed.  I counseled her that if she finds her self needing this every day, we will discontinue medication and opt to change her Lexapro.  Patient will signed controlled substance contract when she is in the office and also provide a urine specimen.  Close follow-up. I looked up patient on [place] Controlled Substances Reporting System PMP AWARE and saw no activity that raised concern of inappropriate use.        Relevant Medications   ALPRAZolam (XANAX) 0.25 MG tablet      -we discussed possible serious and likely etiologies, options for evaluation and workup, limitations of telemedicine visit vs in person visit, treatment, treatment risks and precautions. Pt prefers to treat via telemedicine empirically rather then risking or undertaking an in person visit at this moment. Patient agrees to seek prompt in person care if worsening,  new symptoms arise, or if is not improving with treatment.   I discussed the assessment and treatment plan with the patient. The patient was provided an opportunity to ask questions and all were answered. The patient agreed with the plan and demonstrated an understanding of the instructions.   The patient was advised to call back or seek an in-person evaluation if the symptoms worsen or if the condition fails to improve as anticipated.  I have spent 21 minutes with a patient including precharting, exam, reviewing medical records, and discussion plan of care.       Rennie Plowman, FNP

## 2020-07-28 NOTE — Assessment & Plan Note (Addendum)
Depression overall controlled.  Patient had long discussion regards to "shaking all over" and suspect that this is related to uncontrolled anxiety.  I think that in the setting of multiple sclerosis at times and trouble finding her words, I suspect this exacerbates underlying anxiety.  She has been on Xanax in the past.  Long discussion regards this and how it is a controlled substance and can be habit-forming.  Counseled her on the risk of sedation, respiratory depression in the setting of COPD and the multiple sedating medications that she is on, notably Vicodin.  she understands not to take this medication with Flexeril, alcohol.  We will wean off and discontinue gabapentin as this is sedating and patient does not feel it is completely helpful.  We agreed to start a very low-dose and to take as needed.  I counseled her that if she finds her self needing this every day, we will discontinue medication and opt to change her Lexapro.  Patient will signed controlled substance contract when she is in the office and also provide a urine specimen.  Close follow-up. I looked up patient on [place] Controlled Substances Reporting System PMP AWARE and saw no activity that raised concern of inappropriate use.

## 2020-07-29 ENCOUNTER — Ambulatory Visit
Admission: RE | Admit: 2020-07-29 | Discharge: 2020-07-29 | Disposition: A | Discharge status: Home / Self Care | Payer: Medicare Other | Source: Ambulatory Visit | Attending: Oncology | Admitting: Oncology

## 2020-07-29 ENCOUNTER — Other Ambulatory Visit: Payer: Self-pay

## 2020-07-29 DIAGNOSIS — Z122 Encounter for screening for malignant neoplasm of respiratory organs: Secondary | ICD-10-CM | POA: Insufficient documentation

## 2020-07-29 DIAGNOSIS — Z87891 Personal history of nicotine dependence: Secondary | ICD-10-CM | POA: Diagnosis not present

## 2020-07-29 DIAGNOSIS — F17210 Nicotine dependence, cigarettes, uncomplicated: Secondary | ICD-10-CM | POA: Diagnosis not present

## 2020-08-02 ENCOUNTER — Encounter: Payer: Self-pay | Admitting: *Deleted

## 2020-08-12 ENCOUNTER — Ambulatory Visit: Payer: Medicare Other | Admitting: Cardiovascular Disease

## 2020-08-13 ENCOUNTER — Other Ambulatory Visit: Payer: Self-pay | Admitting: Family

## 2020-08-13 DIAGNOSIS — G35 Multiple sclerosis: Principal | ICD-10-CM

## 2020-08-13 MED ORDER — BACLOFEN 10 MG TABLET
ORAL_TABLET | 0 refills | 0 days | Status: CP
Start: 2020-08-13 — End: ?

## 2020-08-17 ENCOUNTER — Other Ambulatory Visit: Payer: Self-pay

## 2020-08-17 MED ORDER — ROSUVASTATIN CALCIUM 20 MG PO TABS: 20.0000 mg | ORAL_TABLET | Freq: Every day | ORAL | 0 refills | Status: DC

## 2020-08-18 DIAGNOSIS — J449 Chronic obstructive pulmonary disease, unspecified: Secondary | ICD-10-CM | POA: Diagnosis not present

## 2020-08-23 ENCOUNTER — Telehealth: Payer: Self-pay | Admitting: Family

## 2020-08-23 ENCOUNTER — Ambulatory Visit: Payer: Medicare Other | Admitting: Family

## 2020-08-23 ENCOUNTER — Encounter: Payer: Self-pay | Admitting: Family

## 2020-08-23 ENCOUNTER — Other Ambulatory Visit: Payer: Self-pay

## 2020-08-23 VITALS — BP 118/72 | HR 102 | Ht 65.0 in | Wt 169.0 lb

## 2020-08-23 DIAGNOSIS — R0789 Other chest pain: Secondary | ICD-10-CM

## 2020-08-23 DIAGNOSIS — Z720 Tobacco use: Secondary | ICD-10-CM | POA: Diagnosis not present

## 2020-08-23 DIAGNOSIS — R000 Tachycardia, unspecified: Secondary | ICD-10-CM | POA: Diagnosis not present

## 2020-08-23 DIAGNOSIS — I272 Pulmonary hypertension, unspecified: Secondary | ICD-10-CM | POA: Diagnosis not present

## 2020-08-23 DIAGNOSIS — I10 Essential (primary) hypertension: Secondary | ICD-10-CM | POA: Diagnosis not present

## 2020-08-23 MED ORDER — DILTIAZEM HCL ER COATED BEADS 180 MG PO CP24: 180.0000 mg | ORAL_CAPSULE | Freq: Every day | ORAL | 5 refills | Status: DC

## 2020-08-23 NOTE — Telephone Encounter (Signed)
Left message for patient to call back and schedule Medicare Annual Wellness Visit (AWV)   This should be a telephone visit only=30 minutes.  Last AWV 05/21/19; please schedule at anytime with Denisa O'Brien-Blaney at [facility].

## 2020-08-23 NOTE — Patient Instructions (Signed)
Medication Instructions:  Your physician has recommended you make the following change in your medication:   CHANGE Diltiazem (Cardizem) to 180mg  daily  *If you need a refill on your cardiac medications before your next appointment, please call your pharmacy*  Lab Work: No lab work today.   If you have labs (blood work) drawn today and your tests are completely normal, you will receive your results only by: MyChart Message (if you have MyChart) OR . A paper copy in the mail If you have any lab test that is abnormal or we need to change your treatment, we will call you to review the results.  Testing/Procedures: Your EKG today showed sinus tachycardia which is a regular but fast heart rhythm. Increasing your dose of Diltiazem will hopefully help prevent your heart rate from being quite as elevated.  Follow-Up: At [facility], you and your health needs are our priority.  As part of our continuing mission to provide you with exceptional heart care, we have created designated Provider Care Teams.  These Care Teams include your primary Cardiologist (physician) and Advanced Practice Providers (APPs -  Physician Assistants and Nurse Practitioners) who all work together to provide you with the care you need, when you need it.  We recommend signing up for the patient portal called "MyChart".  Sign up information is provided on this After Visit Summary.  MyChart is used to connect with patients for Virtual Visits (Telemedicine).  Patients are able to view lab/test results, encounter notes, upcoming appointments, etc.  Non-urgent messages can be sent to your provider as well.   To learn more about what you can do with MyChart, go to [facility].    Your next appointment:   6 month(s)  The format for your next appointment:   In Person  Provider:    You Chuang see [email or web address], MD or one of the following Advanced Practice Providers on your designated Care Team:    Lorine Bears, NP  Nicolasa Ducking, PA-C  Eula Listen, PA-C  Marisue Ivan, NP  Other Instructions

## 2020-08-23 NOTE — Progress Notes (Signed)
Office Visit    Patient Name: Debra Hogan Date of Encounter: 08/23/2020  Primary Care Provider:  Allegra Grana, FNP Primary Cardiologist:  No primary care provider on file. Electrophysiologist:  None   Chief Complaint    Debra Hogan is a 59 y.o. female with a hx of chest pain, pulmonary hypertension, fibromyalgia, MS, tobacco use, COPD, DDD with chronic back pain, HLD presents today for follow up of pulmonary hypertension.   Past Medical History    Past Medical History:  Diagnosis Date  . Anginal pain (HCC)   . Arthritis   . COPD (chronic obstructive pulmonary disease) (HCC)   . Depression   . Dyspnea   . Fibromyalgia   . Headache   . Heat stroke 2006  . Irritable bowel syndrome   . MS (multiple sclerosis) (HCC)    Followed by Dr. Sherryll Burger   Past Surgical History:  Procedure Laterality Date  . ABDOMINAL HYSTERECTOMY     CERVIX intact; had for noncancerous reasons, endometriosis  . CHOLECYSTECTOMY N/A 01/03/2016   Procedure: LAPAROSCOPIC CHOLECYSTECTOMY WITH INTRAOPERATIVE CHOLANGIOGRAM;  Surgeon: Lattie Haw, MD;  Location: [facility] ORS;  Service: General;  Laterality: N/A;  . ENDOSCOPIC RETROGRADE CHOLANGIOPANCREATOGRAPHY (ERCP) WITH PROPOFOL N/A 01/04/2016   Procedure: ENDOSCOPIC RETROGRADE CHOLANGIOPANCREATOGRAPHY (ERCP) WITH PROPOFOL;  Surgeon: Midge Minium, MD;  Location: [facility] ENDOSCOPY;  Service: Endoscopy;  Laterality: N/A;  . FOOT SURGERY  2001  . LAPAROSCOPIC ENDOMETRIOSIS FULGURATION    . RIGHT/LEFT HEART CATH AND CORONARY ANGIOGRAPHY Bilateral 09/29/2019   Procedure: RIGHT/LEFT HEART CATH AND CORONARY ANGIOGRAPHY;  Surgeon: Yvonne Kendall, MD;  Location: [facility] INVASIVE CV LAB;  Service: Cardiovascular;  Laterality: Bilateral;    Allergies  Allergies  Allergen Reactions  . Valium [Diazepam] Anxiety    History of Present Illness    Debra Hogan is a 59 y.o. female with a hx of chest pain, pulmonary hypertension, fibromyalgia, MS, tobacco use, COPD, DDD  with chronic back pain, HLD. Family history notable for coronary artery disease. She was last seen 01/06/20 by Dr. Kirke Corin.  Lexiscan Myoview 2020 with no evidence ischemia and normal LVEF.  She underwent right and left heart cath 09/2019 with no significant atherosclerotic CAD, vasospasm noted to ostial RCA which improved with intracoronary nitroglycerin, hyperdynamic LVEF with upper normal left heart filling pressures, mild pulmonary hypertension, moderately elevated right heart pressures, normal cardiac output/index.  She was started on low-dose diltiazem.  Echo 05/13/20 with EF 60-65%, no RWMA, gr1DD, mildly elevated PASP, RV normal size/function, no significant valvular abnormalities.  Presents today with husband for follow-up.  She did have to take nitroglycerin twice in the last six months. It relieved her chest discomfort and did not cause chest pain. One episode she had walked 25 foot to and from the mailbox. The second time she was moving up around helping her mom. Endorses lots of stress with helping to take care of her mother.  BP at home 130s/70s. HR at home on blood pressure cuff 90s - 108 bpm.   Reports her breathing is overall stable.  Reports no lightheadedness, dizziness, near-syncope, syncope.  Does note that her heart rate is elevated and endorses palpitations on occasion.  EKGs/Labs/Other Studies Reviewed:   The following studies were reviewed today:   EKG:  EKG is  ordered today.  The ekg ordered today demonstrates ST 102 bpm with no acute ST/T wave changes.   Recent Labs: 06/18/2020: ALT 26; BUN 19; Creatinine, Ser 0.80; Hemoglobin 14.0; Platelets 207; Potassium 4.8;  Sodium 138  Recent Lipid Panel    Component Value Date/Time   CHOL 173 06/18/2020 1559   TRIG 185 (H) 06/18/2020 1559   HDL 54 06/18/2020 1559   CHOLHDL 3.2 06/18/2020 1559   VLDL 57.0 (H) 07/09/2019 1405   LDLCALC 91 06/18/2020 1559   LDLDIRECT 108.0 07/09/2019 1405    Home Medications   Current Meds    Medication Sig  . acyclovir (ZOVIRAX) 400 MG tablet Take 1 tablet (400 mg total) by mouth daily.  . Albuterol Sulfate (PROAIR RESPICLICK) 108 (90 Base) MCG/ACT AEPB Inhale 90 mcg into the lungs every 6 (six) hours as needed (as needed for cough, wheezing). (Patient taking differently: Inhale 180 mcg into the lungs every 6 (six) hours as needed (as needed for cough, wheezing). )  . ALPRAZolam (XANAX) 0.25 MG tablet Take 1 tablet (0.25 mg total) by mouth 2 (two) times daily as needed for anxiety.  Marland Kitchen aspirin EC 81 MG tablet Take 81 mg by mouth daily.  . cholecalciferol (VITAMIN D) 1000 units tablet Take 1,000 Units by mouth daily.  . cyclobenzaprine (FLEXERIL) 10 MG tablet TAKE 1/2 TO 1 (ONE-HALF TO ONE) TABLET BY MOUTH TWICE DAILY AS NEEDED  . diclofenac sodium (VOLTAREN) 1 % GEL Apply 4 g topically 4 (four) times daily.  Marland Kitchen diltiazem (CARDIZEM CD) 120 MG 24 hr capsule Take 1 capsule (120 mg total) by mouth daily.  Marland Kitchen escitalopram (LEXAPRO) 20 MG tablet Take 1 tablet (20 mg total) by mouth daily.  . Fluticasone-Salmeterol (ADVAIR DISKUS) 250-50 MCG/DOSE AEPB Inhale 1 puff into the lungs 2 (two) times daily.  Marland Kitchen gabapentin (NEURONTIN) 100 MG capsule Take 100 mg by mouth in the morning and at bedtime.  Marland Kitchen HYDROcodone-acetaminophen (NORCO/VICODIN) 5-325 MG tablet Take 1 tablet by mouth every 6 (six) hours as needed for moderate pain or severe pain.  Marland Kitchen Lifitegrast (XIIDRA) 5 % SOLN Apply to eye 2 (two) times daily.   Marland Kitchen lisinopril (ZESTRIL) 10 MG tablet Take 1 tablet (10 mg total) by mouth daily.  . Multiple Vitamins-Minerals (CENTRUM SILVER ULTRA WOMENS) TABS Take 1 tablet by mouth daily.  . nitroGLYCERIN (NITROSTAT) 0.4 MG SL tablet Place 1 tablet (0.4 mg total) under the tongue every 5 (five) minutes as needed for chest pain.  Marland Kitchen ondansetron (ZOFRAN-ODT) 4 MG disintegrating tablet Take 1 tablet (4 mg total) by mouth every 6 (six) hours as needed for nausea or vomiting.  . rosuvastatin (CRESTOR) 20 MG  tablet Take 1 tablet (20 mg total) by mouth daily.      Review of Systems   All other systems reviewed and are otherwise negative except as noted above.  Physical Exam    VS:  There were no vitals taken for this visit. , BMI There is no height or weight on file to calculate BMI. GEN: Well nourished, well developed, in no acute distress. HEENT: normal. Neck: Supple, no JVD, carotid bruits, or masses. Cardiac: RRR, no murmurs, rubs, or gallops. No clubbing, cyanosis, edema.  Radials/DP/PT 2+ and equal bilaterally.  Respiratory:  Respirations regular and unlabored, clear to auscultation bilaterally. GI: Soft, nontender, nondistended, BS + x 4. MS: No deformity or atrophy. Skin: Warm and dry, no rash. Neuro:  Strength and sensation are intact. Psych: Normal affect.  Assessment & Plan    1. Atypical chest pain -likely coronary spasm as noted by [facility] 09/2019 which showed no significant atherosclerotic CAD..  Due to uncontrolled blood pressure, tachycardia, 2 episodes of chest pain over the last 6  months we'll plan to increase her diltiazem to 180 mg daily.  Continue PRN nitroglycerin.  No indication for repeat ischemic evaluation at this time.  2. Pulmonary Hypertension - Mild by [facility] 09/2019 with mean pulmonary pressure with capillary wedge pressure of . Presumed due to underlying lung disease.  No additional interventions done.  3. Tobacco use -not interested in quitting.  Smoking cessation encouraged. Recommend utilization of [id number].  4. HTN - BP well controlled. Continue current antihypertensive regimen.  5. HLD -continue rosuvastatin 20 mg daily.  6. COPD -on home O2.  Likely contributory to her mild pulmonary hypertension. Encouraged to schedule follow up with pulmonology (last seen 11/2018 by Dr. Nicholos Johns - will need to establish with new provider at [facility])  Disposition: Follow up in 6 month(s) with Dr. Kirke Corin or APP   Alver Sorrow,  NP 08/23/2020, 3:02 PM

## 2020-09-09 ENCOUNTER — Encounter: Payer: Self-pay | Admitting: Family

## 2020-09-12 ENCOUNTER — Encounter: Payer: Self-pay | Admitting: Anesthesiology

## 2020-09-13 ENCOUNTER — Ambulatory Visit: Payer: Medicare Other | Attending: Anesthesiology | Admitting: Anesthesiology

## 2020-09-13 ENCOUNTER — Telehealth: Payer: Self-pay

## 2020-09-13 ENCOUNTER — Other Ambulatory Visit: Payer: Self-pay

## 2020-09-13 DIAGNOSIS — M5030 Other cervical disc degeneration, unspecified cervical region: Secondary | ICD-10-CM | POA: Diagnosis not present

## 2020-09-13 DIAGNOSIS — M51369 Other intervertebral disc degeneration, lumbar region without mention of lumbar back pain or lower extremity pain: Secondary | ICD-10-CM

## 2020-09-13 DIAGNOSIS — M47812 Spondylosis without myelopathy or radiculopathy, cervical region: Secondary | ICD-10-CM | POA: Diagnosis not present

## 2020-09-13 DIAGNOSIS — G8929 Other chronic pain: Secondary | ICD-10-CM

## 2020-09-13 DIAGNOSIS — G894 Chronic pain syndrome: Secondary | ICD-10-CM

## 2020-09-13 DIAGNOSIS — M5136 Other intervertebral disc degeneration, lumbar region: Secondary | ICD-10-CM | POA: Diagnosis not present

## 2020-09-13 DIAGNOSIS — M5442 Lumbago with sciatica, left side: Secondary | ICD-10-CM | POA: Diagnosis not present

## 2020-09-13 DIAGNOSIS — M47816 Spondylosis without myelopathy or radiculopathy, lumbar region: Secondary | ICD-10-CM

## 2020-09-13 DIAGNOSIS — M5441 Lumbago with sciatica, right side: Secondary | ICD-10-CM

## 2020-09-13 DIAGNOSIS — F1190 Opioid use, unspecified, uncomplicated: Secondary | ICD-10-CM

## 2020-09-13 NOTE — Telephone Encounter (Signed)
Message sent to Front desk to schedule patient a virtual appointment with Dr Pernell Dupre.

## 2020-09-14 NOTE — Telephone Encounter (Signed)
I was on hold with [facility] to try to find out what we needed to do for patient. I held for 29 minutes with no answer & had to hangup. Will try again later on.

## 2020-09-15 ENCOUNTER — Encounter: Payer: Self-pay | Admitting: Anesthesiology

## 2020-09-15 MED ORDER — HYDROCODONE-ACETAMINOPHEN 5-325 MG PO TABS
1.0000 | ORAL_TABLET | Freq: Four times a day (QID) | ORAL | 0 refills | Status: DC | PRN
Start: 2020-09-15 — End: 2020-09-26

## 2020-09-15 NOTE — Telephone Encounter (Signed)
I called patient, she was not aware she had an appt on 09-13-20. No available appt until 10-04-20. Will you send a script for Hydrocodone?

## 2020-09-15 NOTE — Progress Notes (Signed)
Virtual Visit via Telephone Note  I connected with Debra Hogan on 09/15/20 at  1:50 PM EDT by telephone and verified that I am speaking with the correct person using two identifiers.  Location: Patient: Home Provider: [facility]   I discussed the limitations, risks, security and privacy concerns of performing an evaluation and management service by telephone and the availability of in person appointments. I also discussed with the patient that there Haste be a patient responsible charge related to this service. The patient expressed understanding and agreed to proceed.   History of Present Illness: I spoke with Debra Hogan made today via telephone as she was unable to do the video portion of the virtual conference and she reports that she is doing reasonably well and continue to get good relief from her oxycodone medication.  She has run out of this and needs a refill for it.  She mentions that she is still having some diffuse body pain and low back pain which is her primary pain generator but no significant changes in the quality characteristic or distribution of this are noted at this time.  She is currently scheduled to follow-up with Dr. Laban Emperor within the next several weeks as well.  Based on our discussion today she continues to derive good functional benefit from the opioids with no side effects reported and they continue to give her good relief of her pain syndrome.    Observations/Objective:  Current Outpatient Medications:  .  acyclovir (ZOVIRAX) 400 MG tablet, Take 1 tablet (400 mg total) by mouth daily., Disp: 90 tablet, Rfl: 3 .  Albuterol Sulfate (PROAIR RESPICLICK) 108 (90 Base) MCG/ACT AEPB, Inhale 90 mcg into the lungs every 6 (six) hours as needed (as needed for cough, wheezing). (Patient taking differently: Inhale 180 mcg into the lungs every 6 (six) hours as needed (as needed for cough, wheezing). ), Disp: 1 each, Rfl: 2 .  ALPRAZolam (XANAX) 0.25 MG tablet, Take 1 tablet  (0.25 mg total) by mouth 2 (two) times daily as needed for anxiety., Disp: 20 tablet, Rfl: 1 .  aspirin EC 81 MG tablet, Take 81 mg by mouth daily., Disp: , Rfl:  .  cholecalciferol (VITAMIN D) 1000 units tablet, Take 1,000 Units by mouth daily., Disp: , Rfl:  .  cyclobenzaprine (FLEXERIL) 10 MG tablet, TAKE 1/2 TO 1 (ONE-HALF TO ONE) TABLET BY MOUTH TWICE DAILY AS NEEDED, Disp: , Rfl:  .  diclofenac sodium (VOLTAREN) 1 % GEL, Apply 4 g topically 4 (four) times daily., Disp: 1 Tube, Rfl: 3 .  diltiazem (CARDIZEM CD) 180 MG 24 hr capsule, Take 1 capsule (180 mg total) by mouth daily., Disp: 30 capsule, Rfl: 5 .  escitalopram (LEXAPRO) 20 MG tablet, Take 1 tablet (20 mg total) by mouth daily., Disp: 90 tablet, Rfl: 0 .  Fluticasone-Salmeterol (ADVAIR DISKUS) 250-50 MCG/DOSE AEPB, Inhale 1 puff into the lungs 2 (two) times daily., Disp: 1 each, Rfl: 3 .  gabapentin (NEURONTIN) 100 MG capsule, Take 100 mg by mouth in the morning and at bedtime., Disp: , Rfl:  .  HYDROcodone-acetaminophen (NORCO/VICODIN) 5-325 MG tablet, Take 1 tablet by mouth every 6 (six) hours as needed for moderate pain or severe pain., Disp: 90 tablet, Rfl: 0 .  Lifitegrast (XIIDRA) 5 % SOLN, Apply to eye 2 (two) times daily. , Disp: , Rfl:  .  lisinopril (ZESTRIL) 10 MG tablet, Take 1 tablet (10 mg total) by mouth daily., Disp: 90 tablet, Rfl: 4 .  Multiple Vitamins-Minerals (  CENTRUM SILVER ULTRA WOMENS) TABS, Take 1 tablet by mouth daily., Disp: , Rfl:  .  nitroGLYCERIN (NITROSTAT) 0.4 MG SL tablet, Place 1 tablet (0.4 mg total) under the tongue every 5 (five) minutes as needed for chest pain., Disp: 25 tablet, Rfl: 3 .  ondansetron (ZOFRAN-ODT) 4 MG disintegrating tablet, Take 1 tablet (4 mg total) by mouth every 6 (six) hours as needed for nausea or vomiting., Disp: 20 tablet, Rfl: 2 .  rosuvastatin (CRESTOR) 20 MG tablet, Take 1 tablet (20 mg total) by mouth daily., Disp: 90 tablet, Rfl: 0  Assessment and Plan: 1. DDD  (degenerative disc disease), lumbar   2. Chronic bilateral low back pain with bilateral sciatica   3. DDD (degenerative disc disease), cervical   4. Facet arthropathy, cervical   5. Chronic, continuous use of opioids   6. Chronic pain syndrome   7. Lumbar facet joint syndrome   Based on our discussion today and upon review of the [facility] practitioner database information going to refill her medications for September 22 for her hydrocodone.  I have encouraged her to keep her appointment with Dr. Laban Emperor for follow-up and to continue follow-up with her primary care physicians for her baseline medical care.  I encouraged her to continue to stay active and also avoid concomitant use of her benzodiazepine with any opioid medications.  She voices understanding in this regard.  Follow Up Instructions:    I discussed the assessment and treatment plan with the patient. The patient was provided an opportunity to ask questions and all were answered. The patient agreed with the plan and demonstrated an understanding of the instructions.   The patient was advised to call back or seek an in-person evaluation if the symptoms worsen or if the condition fails to improve as anticipated.  I provided 20 minutes of non-face-to-face time during this encounter.   Yevette Edwards, MD

## 2020-09-15 NOTE — Telephone Encounter (Signed)
Cant get her in till 10/11 so she wants to know if he will call her out some pain meds to last until then.

## 2020-09-15 NOTE — Telephone Encounter (Signed)
I have faxed back order for oxygen to [facility] for patient. Since Claris Che was out of office I had Dr. Darrick Huntsman sign. I also included note to please also provide patient with battery operated pack.   I have called & let patient know this was done. [facility] should reach out to patient.

## 2020-09-15 NOTE — Telephone Encounter (Signed)
I was finally able to get in touch with [facility] & they are faxing over orders to be signed for patient's oxygen. I was advised since patient wants a battery operated pack for on the go to write that on the order. Pt has been made aware & said that she has enough oxygen to last until Friday as long as she doesn't go anywhere bc she only has enough oxygen for 1 hr if she leaves. I advised that I would see if I could get Margaret's supervising MD to sign.

## 2020-09-18 DIAGNOSIS — J449 Chronic obstructive pulmonary disease, unspecified: Secondary | ICD-10-CM | POA: Diagnosis not present

## 2020-09-26 MED ORDER — HYDROCODONE-ACETAMINOPHEN 5-325 MG PO TABS
1.0000 | ORAL_TABLET | Freq: Four times a day (QID) | ORAL | 0 refills | Status: DC | PRN
Start: 2020-10-15 — End: 2020-10-07

## 2020-09-26 NOTE — Addendum Note (Signed)
Addended by: Yevette Edwards on: 09/26/2020 10:24 AM   Modules accepted: Orders

## 2020-09-30 ENCOUNTER — Telehealth: Payer: Self-pay | Admitting: Family

## 2020-09-30 NOTE — Telephone Encounter (Signed)
Form has been placed in Margaret Arnetts red folder on her desk.

## 2020-09-30 NOTE — Telephone Encounter (Signed)
I have filled out & placed in red folder for you to sign.

## 2020-09-30 NOTE — Telephone Encounter (Signed)
Patient dropped off handicapp renewal form to be completed by Arnett. Form is up front in color folder. Please call patient when ready to pick up.

## 2020-10-01 NOTE — Telephone Encounter (Signed)
Done. Let pt know.

## 2020-10-01 NOTE — Telephone Encounter (Signed)
I called & LM that I was mailed handicap placard to patient.

## 2020-10-04 ENCOUNTER — Other Ambulatory Visit: Payer: Self-pay

## 2020-10-04 ENCOUNTER — Encounter: Payer: Self-pay | Admitting: Anesthesiology

## 2020-10-04 ENCOUNTER — Ambulatory Visit: Payer: Medicare Other | Attending: Anesthesiology | Admitting: Anesthesiology

## 2020-10-04 DIAGNOSIS — G8929 Other chronic pain: Secondary | ICD-10-CM

## 2020-10-04 DIAGNOSIS — M5030 Other cervical disc degeneration, unspecified cervical region: Secondary | ICD-10-CM

## 2020-10-04 DIAGNOSIS — M5136 Other intervertebral disc degeneration, lumbar region: Secondary | ICD-10-CM | POA: Diagnosis not present

## 2020-10-04 DIAGNOSIS — F1190 Opioid use, unspecified, uncomplicated: Secondary | ICD-10-CM

## 2020-10-04 DIAGNOSIS — M5442 Lumbago with sciatica, left side: Secondary | ICD-10-CM

## 2020-10-04 DIAGNOSIS — M47812 Spondylosis without myelopathy or radiculopathy, cervical region: Secondary | ICD-10-CM | POA: Diagnosis not present

## 2020-10-04 DIAGNOSIS — M5441 Lumbago with sciatica, right side: Secondary | ICD-10-CM

## 2020-10-04 DIAGNOSIS — G894 Chronic pain syndrome: Secondary | ICD-10-CM

## 2020-10-04 DIAGNOSIS — M47816 Spondylosis without myelopathy or radiculopathy, lumbar region: Secondary | ICD-10-CM

## 2020-10-04 NOTE — Progress Notes (Signed)
Virtual Visit via Video Note  I connected with Debra Hogan on 10/04/20 at  2:30 PM EDT by a video enabled telemedicine application and verified that I am speaking with the correct person using two identifiers.  Location: Patient: Home Provider: Pain control center   I discussed the limitations of evaluation and management by telemedicine and the availability of in person appointments. The patient expressed understanding and agreed to proceed.  History of Present Illness: I spoke with Debra Hogan made today via telephone as she was unable to do the video portion of the virtual conference.  She reports that she was doing some work this past weekend and has had a recurrence of her low back pain and leg pain.  This is similar to what she had prior to her last L4-L5 epidural.  This past epidural a limited 75 to 80% of her low back pain.  She reports having quite a bit of spasming and similar pain to what she had before.  She is taking her medications as prescribed and these continue to give her good relief but the pain has been severe.  She takes her Flexeril at bedtime.  No other changes are reported in her bowel bladder function have been stable.    Observations/Objective:   Current Outpatient Medications:  .  acyclovir (ZOVIRAX) 400 MG tablet, Take 1 tablet (400 mg total) by mouth daily., Disp: 90 tablet, Rfl: 3 .  Albuterol Sulfate (PROAIR RESPICLICK) 108 (90 Base) MCG/ACT AEPB, Inhale 90 mcg into the lungs every 6 (six) hours as needed (as needed for cough, wheezing). (Patient taking differently: Inhale 180 mcg into the lungs every 6 (six) hours as needed (as needed for cough, wheezing). ), Disp: 1 each, Rfl: 2 .  ALPRAZolam (XANAX) 0.25 MG tablet, Take 1 tablet (0.25 mg total) by mouth 2 (two) times daily as needed for anxiety., Disp: 20 tablet, Rfl: 1 .  aspirin EC 81 MG tablet, Take 81 mg by mouth daily., Disp: , Rfl:  .  cholecalciferol (VITAMIN D) 1000 units tablet, Take 1,000 Units by mouth  daily., Disp: , Rfl:  .  cyclobenzaprine (FLEXERIL) 10 MG tablet, TAKE 1/2 TO 1 (ONE-HALF TO ONE) TABLET BY MOUTH TWICE DAILY AS NEEDED, Disp: , Rfl:  .  diclofenac sodium (VOLTAREN) 1 % GEL, Apply 4 g topically 4 (four) times daily., Disp: 1 Tube, Rfl: 3 .  diltiazem (CARDIZEM CD) 180 MG 24 hr capsule, Take 1 capsule (180 mg total) by mouth daily., Disp: 30 capsule, Rfl: 5 .  escitalopram (LEXAPRO) 20 MG tablet, Take 1 tablet (20 mg total) by mouth daily., Disp: 90 tablet, Rfl: 0 .  Fluticasone-Salmeterol (ADVAIR DISKUS) 250-50 MCG/DOSE AEPB, Inhale 1 puff into the lungs 2 (two) times daily., Disp: 1 each, Rfl: 3 .  gabapentin (NEURONTIN) 100 MG capsule, Take 100 mg by mouth in the morning and at bedtime., Disp: , Rfl:  .  [START ON 10/15/2020] HYDROcodone-acetaminophen (NORCO/VICODIN) 5-325 MG tablet, Take 1 tablet by mouth every 6 (six) hours as needed for moderate pain or severe pain., Disp: 90 tablet, Rfl: 0 .  Lifitegrast (XIIDRA) 5 % SOLN, Apply to eye 2 (two) times daily. , Disp: , Rfl:  .  lisinopril (ZESTRIL) 10 MG tablet, Take 1 tablet (10 mg total) by mouth daily., Disp: 90 tablet, Rfl: 4 .  Multiple Vitamins-Minerals (CENTRUM SILVER ULTRA WOMENS) TABS, Take 1 tablet by mouth daily., Disp: , Rfl:  .  nitroGLYCERIN (NITROSTAT) 0.4 MG SL tablet, Place 1 tablet (0.4  mg total) under the tongue every 5 (five) minutes as needed for chest pain., Disp: 25 tablet, Rfl: 3 .  ondansetron (ZOFRAN-ODT) 4 MG disintegrating tablet, Take 1 tablet (4 mg total) by mouth every 6 (six) hours as needed for nausea or vomiting., Disp: 20 tablet, Rfl: 2 .  rosuvastatin (CRESTOR) 20 MG tablet, Take 1 tablet (20 mg total) by mouth daily., Disp: 90 tablet, Rfl: 0 Assessment and Plan: 1. DDD (degenerative disc disease), lumbar   2. Chronic bilateral low back pain with bilateral sciatica   3. DDD (degenerative disc disease), cervical   4. Facet arthropathy, cervical   5. Chronic, continuous use of opioids   6.  Chronic pain syndrome   7. Lumbar facet joint syndrome   Based on our discussion today I think it be appropriate to proceed with a repeat epidural.  We have gone over the risks and benefits of the repeat procedure and feel that this is indicated.  She responded very favorably to her last epidural with approximately 75 to 80% relief lasting until she reaggravated her back this past weekend.  I have encouraged her to continue with physical therapy stretching strengthening exercises and she can increase her Flexeril use to include 1/2 tablet in the morning in addition to her evening 10 mg dose.  No changes on her hydrocodone are initiated at this time.  I want her to continue with stretching strengthening exercises as best possible with return to clinic in 2 days for repeat epidural.  Follow Up Instructions:    I discussed the assessment and treatment plan with the patient. The patient was provided an opportunity to ask questions and all were answered. The patient agreed with the plan and demonstrated an understanding of the instructions.   The patient was advised to call back or seek an in-person evaluation if the symptoms worsen or if the condition fails to improve as anticipated.  I provided 20 minutes of non-face-to-face time during this encounter.   Yevette Edwards, MD

## 2020-10-06 ENCOUNTER — Ambulatory Visit: Payer: Medicare Other | Admitting: Anesthesiology

## 2020-10-07 ENCOUNTER — Encounter: Payer: Self-pay | Admitting: Anesthesiology

## 2020-10-07 ENCOUNTER — Ambulatory Visit (HOSPITAL_BASED_OUTPATIENT_CLINIC_OR_DEPARTMENT_OTHER): Payer: Medicare Other | Admitting: Anesthesiology

## 2020-10-07 ENCOUNTER — Other Ambulatory Visit: Payer: Self-pay

## 2020-10-07 ENCOUNTER — Ambulatory Visit
Admission: RE | Admit: 2020-10-07 | Discharge: 2020-10-07 | Disposition: A | Discharge status: Home / Self Care | Payer: Medicare Other | Source: Ambulatory Visit | Attending: Anesthesiology | Admitting: Anesthesiology

## 2020-10-07 ENCOUNTER — Other Ambulatory Visit: Payer: Self-pay | Admitting: Anesthesiology

## 2020-10-07 VITALS — BP 124/73 | HR 87 | Temp 97.1°F | Resp 21 | Ht 65.0 in | Wt 169.0 lb

## 2020-10-07 DIAGNOSIS — M5030 Other cervical disc degeneration, unspecified cervical region: Secondary | ICD-10-CM

## 2020-10-07 DIAGNOSIS — M5441 Lumbago with sciatica, right side: Secondary | ICD-10-CM

## 2020-10-07 DIAGNOSIS — M5442 Lumbago with sciatica, left side: Secondary | ICD-10-CM

## 2020-10-07 DIAGNOSIS — M5136 Other intervertebral disc degeneration, lumbar region: Secondary | ICD-10-CM | POA: Diagnosis not present

## 2020-10-07 DIAGNOSIS — G894 Chronic pain syndrome: Secondary | ICD-10-CM | POA: Diagnosis not present

## 2020-10-07 DIAGNOSIS — G8929 Other chronic pain: Secondary | ICD-10-CM | POA: Diagnosis not present

## 2020-10-07 DIAGNOSIS — R52 Pain, unspecified: Secondary | ICD-10-CM

## 2020-10-07 DIAGNOSIS — F1190 Opioid use, unspecified, uncomplicated: Secondary | ICD-10-CM | POA: Insufficient documentation

## 2020-10-07 DIAGNOSIS — M47812 Spondylosis without myelopathy or radiculopathy, cervical region: Secondary | ICD-10-CM | POA: Insufficient documentation

## 2020-10-07 MED ORDER — SODIUM CHLORIDE 0.9% FLUSH
10.0000 mL | Freq: Once | INTRAVENOUS | Status: AC
  Administered 2020-10-07: 10 mL

## 2020-10-07 MED ORDER — LACTATED RINGERS IV SOLN
1000.0000 mL | INTRAVENOUS | Status: DC
  Administered 2020-10-07: 1000 mL via INTRAVENOUS

## 2020-10-07 MED ORDER — HYDROCODONE-ACETAMINOPHEN 5-325 MG PO TABS: 1.0000 | ORAL_TABLET | Freq: Four times a day (QID) | ORAL | 0 refills | Status: AC | PRN

## 2020-10-07 MED ORDER — TRIAMCINOLONE ACETONIDE 40 MG/ML IJ SUSP
40.0000 mg | Freq: Once | INTRAMUSCULAR | Status: AC
  Administered 2020-10-07: 40 mg
  Filled 2020-10-07: qty 1

## 2020-10-07 MED ORDER — LIDOCAINE HCL (PF) 1 % IJ SOLN
5.0000 mL | Freq: Once | INTRAMUSCULAR | Status: AC
  Administered 2020-10-07: 5 mL via SUBCUTANEOUS
  Filled 2020-10-07: qty 5

## 2020-10-07 MED ORDER — ORPHENADRINE CITRATE 30 MG/ML IJ SOLN
INTRAMUSCULAR | Status: AC
  Filled 2020-10-07: qty 2

## 2020-10-07 MED ORDER — FENTANYL CITRATE (PF) 100 MCG/2ML IJ SOLN
100.0000 ug | Freq: Once | INTRAMUSCULAR | Status: AC
  Administered 2020-10-07: 50 ug via INTRAVENOUS
  Filled 2020-10-07: qty 2

## 2020-10-07 MED ORDER — IOHEXOL 180 MG/ML  SOLN
10.0000 mL | Freq: Once | INTRAMUSCULAR | Status: AC | PRN
  Administered 2020-10-07: 10 mL via EPIDURAL

## 2020-10-07 MED ORDER — ROPIVACAINE HCL 2 MG/ML IJ SOLN
10.0000 mL | Freq: Once | INTRAMUSCULAR | Status: AC
  Administered 2020-10-07: 10 mL via EPIDURAL
  Filled 2020-10-07: qty 10

## 2020-10-07 MED ORDER — MIDAZOLAM HCL 5 MG/5ML IJ SOLN
INTRAMUSCULAR | Status: AC
  Filled 2020-10-07: qty 5

## 2020-10-07 MED ORDER — MIDAZOLAM HCL 2 MG/2ML IJ SOLN
5.0000 mg | Freq: Once | INTRAMUSCULAR | Status: AC
  Administered 2020-10-07: 3 mg via INTRAVENOUS
  Filled 2020-10-07: qty 5

## 2020-10-07 NOTE — Progress Notes (Signed)
Nursing Pain Medication Assessment:  Safety precautions to be maintained throughout the outpatient stay will include: orient to surroundings, keep bed in low position, maintain call bell within reach at all times, provide assistance with transfer out of bed and ambulation.  Medication Inspection Compliance: Pill count conducted under aseptic conditions, in front of the patient. Neither the pills nor the bottle was removed from the patient's sight at any time. Once count was completed pills were immediately returned to the patient in their original bottle.  Medication: Hydrocodone/APAP Pill/Patch Count: 19 of 90 pills remain Pill/Patch Appearance: Markings consistent with prescribed medication Bottle Appearance: Standard pharmacy container. Clearly labeled. Filled Date: 09 / 22 / 2021 Last Medication intake:  Today

## 2020-10-07 NOTE — Progress Notes (Signed)
Subjective:  Patient ID: Debra Hogan, female    DOB: Jul 27, 1961  Age: 59 y.o. MRN: [id number]  CC: Back Pain (lumbar bilateral, worse on the left )   Procedure: L4-5 epidural steroid under fluoroscopic guidance with moderate sedation and left side L5 trigger point injection to the lumbar region x1  HPI Debra Hogan presents for reevaluation.  She has done very well with previous epidurals unfortunately she injured herself recently as noted in her previous dictation.  She presents today requesting a repeat epidural with persistent low back pain similar to what she is experienced in the past.  She states that in August she had an L4-5 epidural which gave her approximately 80 to 85% relief that lasted up until this recent injury where she was trying to assist with transportation of her mother.  This pain is been extraordinary per her report since then.  She is also having some spasming in the low back.  No other changes in the quality characteristic or distribution are noted.  Outpatient Medications Prior to Visit  Medication Sig Dispense Refill  . acyclovir (ZOVIRAX) 400 MG tablet Take 1 tablet (400 mg total) by mouth daily. 90 tablet 3  . Albuterol Sulfate (PROAIR RESPICLICK) 108 (90 Base) MCG/ACT AEPB Inhale 90 mcg into the lungs every 6 (six) hours as needed (as needed for cough, wheezing). (Patient taking differently: Inhale 180 mcg into the lungs every 6 (six) hours as needed (as needed for cough, wheezing). ) 1 each 2  . ALPRAZolam (XANAX) 0.25 MG tablet Take 1 tablet (0.25 mg total) by mouth 2 (two) times daily as needed for anxiety. 20 tablet 1  . aspirin EC 81 MG tablet Take 81 mg by mouth daily.    . cholecalciferol (VITAMIN D) 1000 units tablet Take 1,000 Units by mouth daily.    . cyclobenzaprine (FLEXERIL) 10 MG tablet TAKE 1/2 TO 1 (ONE-HALF TO ONE) TABLET BY MOUTH TWICE DAILY AS NEEDED    . diclofenac sodium (VOLTAREN) 1 % GEL Apply 4 g topically 4 (four) times daily. 1 Tube 3  .  diltiazem (CARDIZEM CD) 180 MG 24 hr capsule Take 1 capsule (180 mg total) by mouth daily. 30 capsule 5  . escitalopram (LEXAPRO) 20 MG tablet Take 1 tablet (20 mg total) by mouth daily. 90 tablet 0  . Fluticasone-Salmeterol (ADVAIR DISKUS) 250-50 MCG/DOSE AEPB Inhale 1 puff into the lungs 2 (two) times daily. 1 each 3  . gabapentin (NEURONTIN) 100 MG capsule Take 100 mg by mouth in the morning and at bedtime.    Marland Kitchen Lifitegrast (XIIDRA) 5 % SOLN Apply to eye 2 (two) times daily.     Marland Kitchen lisinopril (ZESTRIL) 10 MG tablet Take 1 tablet (10 mg total) by mouth daily. 90 tablet 4  . Multiple Vitamins-Minerals (CENTRUM SILVER ULTRA WOMENS) TABS Take 1 tablet by mouth daily.    . ondansetron (ZOFRAN-ODT) 4 MG disintegrating tablet Take 1 tablet (4 mg total) by mouth every 6 (six) hours as needed for nausea or vomiting. 20 tablet 2  . rosuvastatin (CRESTOR) 20 MG tablet Take 1 tablet (20 mg total) by mouth daily. 90 tablet 0  . [START ON 10/15/2020] HYDROcodone-acetaminophen (NORCO/VICODIN) 5-325 MG tablet Take 1 tablet by mouth every 6 (six) hours as needed for moderate pain or severe pain. 90 tablet 0  . nitroGLYCERIN (NITROSTAT) 0.4 MG SL tablet Place 1 tablet (0.4 mg total) under the tongue every 5 (five) minutes as needed for chest pain. 25 tablet 3  No facility-administered medications prior to visit.    Review of Systems CNS: No confusion or sedation Cardiac: No angina or palpitations GI: No abdominal pain or constipation Constitutional: No nausea vomiting fevers or chills  Objective:  BP (!) 161/68 (BP Location: Left Arm, Patient Position: Sitting, Cuff Size: Normal)   Pulse 87   Temp 97.8 F (36.6 C) (Temporal)   Resp 16   Ht  (1.651 m)   Wt 169 lb (76.7 kg)   SpO2 100% Comment: 2 liters O2  BMI 28.12 kg/m    BP Readings from Last 3 Encounters:  10/07/20 (!) 161/68  08/23/20 118/72  07/28/20 (!) 143/82     Wt Readings from Last 3 Encounters:  10/07/20 169 lb (76.7 kg)   08/23/20 169 lb (76.7 kg)  07/29/20 155 lb (70.3 kg)     Physical Exam Pt is alert and oriented PERRL EOMI HEART IS RRR no murmur or rub LCTA no wheezing or rales MUSCULOSKELETAL reveals a trigger point in the left lumbar region approximately L5 left side.  She has persistent straight leg raise on the right side.  She has a significant amount of pain as she goes from seated to standing and as she moves about the room.  Any slight motion in the lumbar region causes her to grunt with associated pain.  Labs  Lab Results  Component Value Date   HGBA1C 5.0 06/18/2020   HGBA1C 5.0 07/09/2019   HGBA1C 5.2 05/08/2018   Lab Results  Component Value Date   LDLCALC 91 06/18/2020   CREATININE 0.80 06/18/2020    -------------------------------------------------------------------------------------------------------------------- Lab Results  Component Value Date   WBC 6.7 06/18/2020   HGB 14.0 06/18/2020   HCT 41.3 06/18/2020   PLT 207 06/18/2020   GLUCOSE 81 06/18/2020   CHOL 173 06/18/2020   TRIG 185 (H) 06/18/2020   HDL 54 06/18/2020   LDLDIRECT 108.0 07/09/2019   LDLCALC 91 06/18/2020   ALT 26 06/18/2020   AST 20 06/18/2020   NA 138 06/18/2020   K 4.8 06/18/2020   CL 100 06/18/2020   CREATININE 0.80 06/18/2020   BUN 19 06/18/2020   CO2 32 06/18/2020   TSH 5.40 (H) 07/09/2019   INR 1.0 10/22/2015   HGBA1C 5.0 06/18/2020    --------------------------------------------------------------------------------------------------------------------- No results found.   Assessment & Plan:   Debra Hogan was seen today for back pain.  Diagnoses and all orders for this visit:  DDD (degenerative disc disease), lumbar  Chronic bilateral low back pain with bilateral sciatica  DDD (degenerative disc disease), cervical  Facet arthropathy, cervical  Chronic, continuous use of opioids  Chronic pain syndrome  Other orders -     HYDROcodone-acetaminophen (NORCO/VICODIN) 5-325 MG  tablet; Take 1 tablet by mouth every 6 (six) hours as needed for moderate pain or severe pain. -     triamcinolone acetonide (KENALOG-40) injection 40 mg -     sodium chloride flush (NS) 0.9 % injection 10 mL -     ropivacaine (PF) 2 mg/mL (0.2%) (NAROPIN) injection 10 mL -     midazolam (VERSED) injection 5 mg -     lidocaine (PF) (XYLOCAINE) 1 % injection 5 mL -     lactated ringers infusion 1,000 mL -     iohexol (OMNIPAQUE) 180 MG/ML injection 10 mL -     fentaNYL (SUBLIMAZE) injection 100 mcg        ----------------------------------------------------------------------------------------------------------------------  Problem List Items Addressed This Visit      Unprioritized  Chronic pain syndrome   Relevant Medications   HYDROcodone-acetaminophen (NORCO/VICODIN) 5-325 MG tablet (Start on 11/14/2020)   triamcinolone acetonide (KENALOG-40) injection 40 mg   ropivacaine (PF) 2 mg/mL (0.2%) (NAROPIN) injection 10 mL   lidocaine (PF) (XYLOCAINE) 1 % injection 5 mL   fentaNYL (SUBLIMAZE) injection 100 mcg   Chronic, continuous use of opioids   DDD (degenerative disc disease), cervical   Relevant Medications   HYDROcodone-acetaminophen (NORCO/VICODIN) 5-325 MG tablet (Start on 11/14/2020)   triamcinolone acetonide (KENALOG-40) injection 40 mg   fentaNYL (SUBLIMAZE) injection 100 mcg   DDD (degenerative disc disease), lumbar - Primary   Relevant Medications   HYDROcodone-acetaminophen (NORCO/VICODIN) 5-325 MG tablet (Start on 11/14/2020)   triamcinolone acetonide (KENALOG-40) injection 40 mg   fentaNYL (SUBLIMAZE) injection 100 mcg   Facet arthropathy, cervical   Low back pain   Relevant Medications   HYDROcodone-acetaminophen (NORCO/VICODIN) 5-325 MG tablet (Start on 11/14/2020)   triamcinolone acetonide (KENALOG-40) injection 40 mg   fentaNYL (SUBLIMAZE) injection 100 mcg         ----------------------------------------------------------------------------------------------------------------------  1. DDD (degenerative disc disease), lumbar We will proceed with a repeat lumbar epidural today.  She is responded to these in the past favorably.  We have gone over the risks and benefits of the procedure with her in full detail and all questions were answered.  I will have her return to clinic in 2 months.  I want her to continue core stretching strengthening exercises once again reviewed today and it is imperative that she reduce the amount of cigarette smoking as reviewed to her today.  2. Chronic bilateral low back pain with bilateral sciatica As above  3. DDD (degenerative disc disease), cervical As above  4. Facet arthropathy, cervical Continue core strengthening  5. Chronic, continuous use of opioids I have reviewed the [facility] practitioner database information and it is appropriate.  Refill will be given for next month with return to clinic in 2 months.  No changes in her medication management are initiated.  6. Chronic pain syndrome As above    ----------------------------------------------------------------------------------------------------------------------  I am having Debra Hogan maintain her Albuterol Sulfate, Centrum Silver Ultra Womens, cholecalciferol, diclofenac sodium, acyclovir, Fluticasone-Salmeterol, nitroGLYCERIN, ondansetron, lisinopril, Xiidra, escitalopram, aspirin EC, cyclobenzaprine, ALPRAZolam, rosuvastatin, gabapentin, diltiazem, and HYDROcodone-acetaminophen.   Meds ordered this encounter  Medications  . HYDROcodone-acetaminophen (NORCO/VICODIN) 5-325 MG tablet    Sig: Take 1 tablet by mouth every 6 (six) hours as needed for moderate pain or severe pain.    Dispense:  90 tablet    Refill:  0    Do not take Benzodiazepine medication within 8 hours of taking this drug.  . triamcinolone acetonide (KENALOG-40)  injection 40 mg  . sodium chloride flush (NS) 0.9 % injection 10 mL  . ropivacaine (PF) 2 mg/mL (0.2%) (NAROPIN) injection 10 mL  . midazolam (VERSED) injection 5 mg  . lidocaine (PF) (XYLOCAINE) 1 % injection 5 mL  . lactated ringers infusion 1,000 mL  . iohexol (OMNIPAQUE) 180 MG/ML injection 10 mL  . fentaNYL (SUBLIMAZE) injection 100 mcg   Patient's Medications  New Prescriptions   No medications on file  Previous Medications   ACYCLOVIR (ZOVIRAX) 400 MG TABLET    Take 1 tablet (400 mg total) by mouth daily.   ALBUTEROL SULFATE (PROAIR RESPICLICK) 108 (90 BASE) MCG/ACT AEPB    Inhale 90 mcg into the lungs every 6 (six) hours as needed (as needed for cough, wheezing).   ALPRAZOLAM (XANAX) 0.25 MG TABLET  Take 1 tablet (0.25 mg total) by mouth 2 (two) times daily as needed for anxiety.   ASPIRIN EC 81 MG TABLET    Take 81 mg by mouth daily.   CHOLECALCIFEROL (VITAMIN D) 1000 UNITS TABLET    Take 1,000 Units by mouth daily.   CYCLOBENZAPRINE (FLEXERIL) 10 MG TABLET    TAKE 1/2 TO 1 (ONE-HALF TO ONE) TABLET BY MOUTH TWICE DAILY AS NEEDED   DICLOFENAC SODIUM (VOLTAREN) 1 % GEL    Apply 4 g topically 4 (four) times daily.   DILTIAZEM (CARDIZEM CD) 180 MG 24 HR CAPSULE    Take 1 capsule (180 mg total) by mouth daily.   ESCITALOPRAM (LEXAPRO) 20 MG TABLET    Take 1 tablet (20 mg total) by mouth daily.   FLUTICASONE-SALMETEROL (ADVAIR DISKUS) 250-50 MCG/DOSE AEPB    Inhale 1 puff into the lungs 2 (two) times daily.   GABAPENTIN (NEURONTIN) 100 MG CAPSULE    Take 100 mg by mouth in the morning and at bedtime.   LIFITEGRAST (XIIDRA) 5 % SOLN    Apply to eye 2 (two) times daily.    LISINOPRIL (ZESTRIL) 10 MG TABLET    Take 1 tablet (10 mg total) by mouth daily.   MULTIPLE VITAMINS-MINERALS (CENTRUM SILVER ULTRA WOMENS) TABS    Take 1 tablet by mouth daily.   NITROGLYCERIN (NITROSTAT) 0.4 MG SL TABLET    Place 1 tablet (0.4 mg total) under the tongue every 5 (five) minutes as needed for chest  pain.   ONDANSETRON (ZOFRAN-ODT) 4 MG DISINTEGRATING TABLET    Take 1 tablet (4 mg total) by mouth every 6 (six) hours as needed for nausea or vomiting.   ROSUVASTATIN (CRESTOR) 20 MG TABLET    Take 1 tablet (20 mg total) by mouth daily.  Modified Medications   Modified Medication Previous Medication   HYDROCODONE-ACETAMINOPHEN (NORCO/VICODIN) 5-325 MG TABLET HYDROcodone-acetaminophen (NORCO/VICODIN) 5-325 MG tablet      Take 1 tablet by mouth every 6 (six) hours as needed for moderate pain or severe pain.    Take 1 tablet by mouth every 6 (six) hours as needed for moderate pain or severe pain.  Discontinued Medications   No medications on file   ----------------------------------------------------------------------------------------------------------------------  Follow-up: Return in about 2 months (around 12/07/2020) for evaluation, med refill.   Procedure: L4-5 LESI with fluoroscopic guidance and with moderate sedation  NOTE: The risks, benefits, and expectations of the procedure have been discussed and explained to the patient who was understanding and in agreement with suggested treatment plan. No guarantees were made.  DESCRIPTION OF PROCEDURE: Lumbar epidural steroid injection with 3 mg IV Versed and 50 mcg fentanyl, EKG, blood pressure, pulse, and pulse oximetry monitoring. The procedure was performed with the patient in the prone position under fluoroscopic guidance.  Sterile prep x3 was initiated and I then injected subcutaneous lidocaine to the overlying L4-5 site after its fluoroscopic identifictation.  Using strict aseptic technique, I then advanced an 18-gauge Tuohy epidural needle in the midline using interlaminar approach via loss-of-resistance to saline technique. There was negative aspiration for heme or  CSF.  I then confirmed position with both AP and Lateral fluoroscan.  2 cc of contrast dye were injected and a  total of 5 mL of Preservative-Free normal saline mixed with 40 mg  of Kenalog and 1cc Ropicaine 0.2 percent were injected incrementally via the  epidurally placed needle. The needle was removed. The patient tolerated the injection well and was convalesced and discharged to  home in stable condition. Should the patient have any post procedure difficulty they have been instructed on how to contact us for assistance.   Trigger point injection: The area overlying the aforementioned trigger points were prepped with alcohol. They were then injected with a 25-gauge needle with 4 cc of ropivacaine 0.2% and Decadron 4 mg at 1 site after negative aspiration for heme. This was performed after informed consent was obtained and risks and benefits reviewed. She tolerated this procedure without difficulty and was convalesced and discharged to home in stable condition for follow-up as mentioned.  @Tanisia Yokley  Pernell Dupre, MD@   Yevette Edwards, MD

## 2020-10-18 DIAGNOSIS — J449 Chronic obstructive pulmonary disease, unspecified: Secondary | ICD-10-CM | POA: Diagnosis not present

## 2020-10-27 ENCOUNTER — Other Ambulatory Visit: Payer: Self-pay

## 2020-10-29 ENCOUNTER — Ambulatory Visit: Payer: Medicare Other | Admitting: Family

## 2020-11-03 ENCOUNTER — Ambulatory Visit: Payer: Medicare Other | Admitting: Family

## 2020-11-03 ENCOUNTER — Telehealth: Payer: Self-pay

## 2020-11-03 NOTE — Telephone Encounter (Signed)
LMTCB bc patient has no showed up last two scheduled appointments. We wanted to make sure patient was okay & get her rescheduled.

## 2020-11-05 ENCOUNTER — Ambulatory Visit: Payer: Medicare Other | Admitting: Family

## 2020-11-07 ENCOUNTER — Other Ambulatory Visit: Payer: Self-pay | Admitting: Family

## 2020-11-07 ENCOUNTER — Encounter: Payer: Self-pay | Admitting: Family

## 2020-11-07 DIAGNOSIS — F411 Generalized anxiety disorder: Secondary | ICD-10-CM

## 2020-11-08 ENCOUNTER — Other Ambulatory Visit: Payer: Self-pay

## 2020-11-08 DIAGNOSIS — A6004 Herpesviral vulvovaginitis: Secondary | ICD-10-CM

## 2020-11-08 MED ORDER — ACYCLOVIR 400 MG PO TABS: 400.0000 mg | ORAL_TABLET | Freq: Every day | ORAL | 3 refills | Status: DC

## 2020-11-08 MED ORDER — ESCITALOPRAM OXALATE 20 MG PO TABS: 20.0000 mg | ORAL_TABLET | Freq: Every day | ORAL | 0 refills | Status: DC

## 2020-11-08 MED ORDER — ROSUVASTATIN CALCIUM 20 MG PO TABS
20.0000 mg | ORAL_TABLET | Freq: Every day | ORAL | 0 refills | Status: DC
Start: 2020-11-08 — End: 2021-02-25

## 2020-11-18 DIAGNOSIS — J449 Chronic obstructive pulmonary disease, unspecified: Secondary | ICD-10-CM | POA: Diagnosis not present

## 2020-12-18 DIAGNOSIS — J449 Chronic obstructive pulmonary disease, unspecified: Secondary | ICD-10-CM | POA: Diagnosis not present

## 2020-12-31 ENCOUNTER — Other Ambulatory Visit: Payer: Self-pay

## 2020-12-31 ENCOUNTER — Encounter: Payer: Self-pay | Admitting: Family

## 2020-12-31 ENCOUNTER — Telehealth (INDEPENDENT_AMBULATORY_CARE_PROVIDER_SITE_OTHER): Payer: Medicare Other | Admitting: Family

## 2020-12-31 VITALS — Ht 65.0 in | Wt 169.0 lb

## 2020-12-31 DIAGNOSIS — E785 Hyperlipidemia, unspecified: Secondary | ICD-10-CM

## 2020-12-31 DIAGNOSIS — F411 Generalized anxiety disorder: Secondary | ICD-10-CM

## 2020-12-31 DIAGNOSIS — J449 Chronic obstructive pulmonary disease, unspecified: Secondary | ICD-10-CM

## 2020-12-31 DIAGNOSIS — G35 Multiple sclerosis: Secondary | ICD-10-CM

## 2020-12-31 DIAGNOSIS — I10 Essential (primary) hypertension: Secondary | ICD-10-CM | POA: Diagnosis not present

## 2020-12-31 MED ORDER — GABAPENTIN 100 MG PO CAPS
100.0000 mg | ORAL_CAPSULE | Freq: Two times a day (BID) | ORAL | 4 refills | Status: DC
Start: 2020-12-31 — End: 2021-05-09

## 2020-12-31 NOTE — Patient Instructions (Addendum)
It is imperative that you are seen AT least twice per year for labs and monitoring. Monitor blood pressure at home and me 5-6 reading on separate days. Goal is less than 120/80, based on newest guidelines, however we certainly want to be less than 130/80;  if persistently higher, please make sooner follow up appointment so we can recheck you blood pressure and manage/ adjust medications.  Please call [facility] as discussed to schedule 6 month follow up ; due in Feb 2022.

## 2020-12-31 NOTE — Assessment & Plan Note (Signed)
Stable. Continue  xanax half tablet of  0.25mg  prn , using approx 2/ per week, lexapro 20mg . Follow up in 3 months.

## 2020-12-31 NOTE — Assessment & Plan Note (Signed)
Stable. Pending  Updated lipid panel. Continue crestor 20mg .

## 2020-12-31 NOTE — Assessment & Plan Note (Addendum)
Stable. Continue diltiazem 180mg , lisinopril 10mg  qd. Anginal episodes are at baseline, couple of times per year with resolution with nitro. Advised of importance of close vigilance and continued follow up with cardiology.

## 2020-12-31 NOTE — Assessment & Plan Note (Addendum)
No recent follow up in setting of progression. Will continue to monitor and patient doesn't have financial means to follow up with neurology at this time. Will discuss again at CPE.Continue gabapentin 100mg  bid.

## 2020-12-31 NOTE — Progress Notes (Signed)
Virtual Visit via Video Note  I connected with@  on 12/31/20 at  2:30 PM EST by a video enabled telemedicine application and verified that I am speaking with the correct person using two identifiers.  Location patient: home Location provider:work  Persons participating in the virtual visit: patient, provider  I discussed the limitations of evaluation and management by telemedicine and the availability of in person appointments. The patient expressed understanding and agreed to proceed.   HPI: She feels that multiple sclerosis is worsening. Hasnt seen Dr Johnnye Lana in some time due to financial reasons. More difficulty in walking due to pain and fatigue. SOB with exertion at baseline. Husband has been sick and has been home most of time.   Wears 2L o2 if talking and sitting otherwise uses when walking around and at night. hasnt returned to pulmonology.   HTN- compliant with diltiazem 180mg , lisinopril 10mg  qd. BP at home, and it was 'normal' however doesn't recall value.   Used nitro 2 weeks ago due to CP when cleaning house with resolution of CP. Cardiac cath 10/20 and echo 04/2020. Lexiscan myoview 2020 without evidence of ischemia.   HLD-compliant with crestor 20mg   Anxiety- unchanged.  takes xanax half tablet of  0.25mg  prn, using approx 2/ per week, compliant with lexapro 20mg . Sleeping well and sleeps better when more active.  Fibromyalgia- she takes gabapentin 100mg  qam, and qpm. Medication is not too sedating.   Follow up with Caitlin 4 months ago and increased diltiazem to 180mg . Due for follow up in 2 months.   Follows with pain management, Dr 05/2020 to smoke.   ROS: See pertinent positives and negatives per HPI.    EXAM:  VITALS per patient if applicable: Ht 5\' 5"  (1.651 m)   Wt 169 lb (76.7 kg)   BMI 28.12 kg/m  BP Readings from Last 3 Encounters:  10/07/20 124/73  08/23/20 118/72  07/28/20 (!) 143/82   Wt Readings from Last 3 Encounters:  12/31/20  169 lb (76.7 kg)  10/07/20 169 lb (76.7 kg)  08/23/20 169 lb (76.7 kg)    GENERAL: alert, oriented, appears well and in no acute distress  HEENT: atraumatic, conjunttiva clear, no obvious abnormalities on inspection of external nose and ears  NECK: normal movements of the head and neck  LUNGS: on inspection no signs of respiratory distress, breathing rate appears normal, no obvious gross SOB, gasping or wheezing  CV: no obvious cyanosis  MS: moves all visible extremities without noticeable abnormality  PSYCH/NEURO: pleasant and cooperative, no obvious depression or anxiety, speech and thought processing grossly intact  ASSESSMENT AND PLAN:  Discussed the following assessment and plan:  Problem List Items Addressed This Visit      Cardiovascular and Mediastinum   HTN (hypertension) - Primary    Stable. Continue diltiazem 180mg , lisinopril 10mg  qd. Anginal episodes are at baseline, couple of times per year with resolution with nitro. Advised of importance of close vigilance and continued follow up with cardiology.       Relevant Orders   CBC with Differential/Platelet   Hemoglobin A1c   Comprehensive metabolic panel   Lipid panel   TSH   VITAMIN D 25 Hydroxy (Vit-D Deficiency, Fractures)     Respiratory   COPD, moderate (HCC)    Baseline. Continue 02 [address] with activity, sleep. Encouraged again to re-establish with pulmonology; she politely declines at this time.         Nervous and Auditory   Multiple sclerosis (HCC)  No recent follow up in setting of progression. Will continue to monitor and patient doesn't have financial means to follow up with neurology at this time. Will discuss again at CPE.Continue gabapentin 100mg  bid.         Other   Generalized anxiety disorder    Stable. Continue  xanax half tablet of  0.25mg  prn , using approx 2/ per week, lexapro 20mg . Follow up in 3 months.       HLD (hyperlipidemia)    Stable. Pending  Updated lipid panel. Continue  crestor 20mg .          -we discussed possible serious and likely etiologies, options for evaluation and workup, limitations of telemedicine visit vs in person visit, treatment, treatment risks and precautions. Pt prefers to treat via telemedicine empirically rather then risking or undertaking an in person visit at this moment.  .   I discussed the assessment and treatment plan with the patient. The patient was provided an opportunity to ask questions and all were answered. The patient agreed with the plan and demonstrated an understanding of the instructions.   The patient was advised to call back or seek an in-person evaluation if the symptoms worsen or if the condition fails to improve as anticipated.   , FNP

## 2020-12-31 NOTE — Assessment & Plan Note (Signed)
Baseline. Continue 02 [place] with activity, sleep. Encouraged again to re-establish with pulmonology; she politely declines at this time.

## 2021-01-05 ENCOUNTER — Telehealth: Payer: Self-pay | Admitting: Family

## 2021-01-05 NOTE — Telephone Encounter (Signed)
I spoke with patient before sending to you. Pt stated that she had taken when she had been sitting for at least 10 minutes resting best she could. She said that right now her nerves were bad & she was shaking. I asked if she felt anxious & she said that she did. She does not have an CP, HA, vision changes, SOB.

## 2021-01-05 NOTE — Telephone Encounter (Signed)
BP Readings from Last 3 Encounters:  10/07/20 124/73  08/23/20 118/72  07/28/20 (!) 143/82   Call pt Typically her BP has been well controlled Was this 162/93 an outlier or where her blood pressures have been running  lately? Is her pain more bothersome today or had she just smoke a cigarette as this will contribute If blood pressure is persistently greater than 130/80 and this is multiple readings that are elevated THEN I would advise increasing lisinopril to 20mg  from the 10mg  she is on. Continue cardizem 180mg . If she increases lisionpril to 20mg , please tell her to call and let know so we can update chart AND order a BMP which will need to be done ONE week after increase

## 2021-01-05 NOTE — Telephone Encounter (Signed)
LM that since it was almost closing I would call back tomorrow unless she reached me in next few minutes. I advised that if she was up tonight that she take some more readings & make sure that not only is she resting, but that she has nit smoked right before checking. I will try to reach patient tomorrow.

## 2021-01-05 NOTE — Telephone Encounter (Signed)
patient called in with blood pressure reading:01-05-21 162/93

## 2021-01-06 ENCOUNTER — Other Ambulatory Visit: Payer: Self-pay

## 2021-01-06 ENCOUNTER — Telehealth: Payer: Self-pay | Admitting: Family

## 2021-01-06 DIAGNOSIS — I10 Essential (primary) hypertension: Secondary | ICD-10-CM

## 2021-01-06 DIAGNOSIS — G35 Multiple sclerosis: Secondary | ICD-10-CM

## 2021-01-06 MED ORDER — ONDANSETRON 4 MG PO TBDP: 4.0000 mg | ORAL_TABLET | Freq: Four times a day (QID) | ORAL | 2 refills | Status: AC | PRN

## 2021-01-06 MED ORDER — LISINOPRIL 20 MG PO TABS: 20.0000 mg | ORAL_TABLET | Freq: Every day | ORAL | 3 refills | Status: DC

## 2021-01-06 NOTE — Telephone Encounter (Signed)
I spoke with patient & she is not having CP, HA, vision changes, numbness tingling pain in left arm or jaw. Pt stated that her pain was no more than usual & she knows not to take until resting or directly after smoking. She will increase Lisinopril to 20mg  & I have sent to pharmacy. She has BMP scheduled for 1 week. She will monitor BP's as well & let know how she is doing.

## 2021-01-06 NOTE — Telephone Encounter (Signed)
LMTCB

## 2021-01-06 NOTE — Telephone Encounter (Signed)
Patient called in need refill for ondansetron (ZOFRAN-ODT) 4 MG disintegrating tablet

## 2021-01-06 NOTE — Telephone Encounter (Signed)
Patient called in with Bp reading : over night 146/90, 150/84 -heart rate 110 Morning : 175/93, 155/90, 152/83

## 2021-01-13 ENCOUNTER — Other Ambulatory Visit: Payer: Medicare Other

## 2021-01-16 ENCOUNTER — Emergency Department
Admission: EM | Admit: 2021-01-16 | Discharge: 2021-01-16 | Discharge status: Against Medical Advice | Payer: Medicare Other | Attending: Emergency Medicine | Admitting: Emergency Medicine

## 2021-01-16 ENCOUNTER — Emergency Department: Payer: Medicare Other

## 2021-01-16 ENCOUNTER — Other Ambulatory Visit: Payer: Self-pay

## 2021-01-16 ENCOUNTER — Encounter: Payer: Self-pay | Admitting: Emergency Medicine

## 2021-01-16 DIAGNOSIS — Z79899 Other long term (current) drug therapy: Secondary | ICD-10-CM | POA: Diagnosis not present

## 2021-01-16 DIAGNOSIS — S8012XA Contusion of left lower leg, initial encounter: Secondary | ICD-10-CM | POA: Diagnosis not present

## 2021-01-16 DIAGNOSIS — Z7982 Long term (current) use of aspirin: Secondary | ICD-10-CM | POA: Insufficient documentation

## 2021-01-16 DIAGNOSIS — W19XXXA Unspecified fall, initial encounter: Secondary | ICD-10-CM | POA: Diagnosis not present

## 2021-01-16 DIAGNOSIS — F17210 Nicotine dependence, cigarettes, uncomplicated: Secondary | ICD-10-CM | POA: Diagnosis not present

## 2021-01-16 DIAGNOSIS — J449 Chronic obstructive pulmonary disease, unspecified: Secondary | ICD-10-CM | POA: Insufficient documentation

## 2021-01-16 DIAGNOSIS — E039 Hypothyroidism, unspecified: Secondary | ICD-10-CM | POA: Diagnosis not present

## 2021-01-16 DIAGNOSIS — S8992XA Unspecified injury of left lower leg, initial encounter: Secondary | ICD-10-CM | POA: Diagnosis not present

## 2021-01-16 DIAGNOSIS — I272 Pulmonary hypertension, unspecified: Secondary | ICD-10-CM | POA: Diagnosis not present

## 2021-01-16 DIAGNOSIS — Y92009 Unspecified place in unspecified non-institutional (private) residence as the place of occurrence of the external cause: Secondary | ICD-10-CM | POA: Diagnosis not present

## 2021-01-16 DIAGNOSIS — S8011XA Contusion of right lower leg, initial encounter: Secondary | ICD-10-CM | POA: Diagnosis not present

## 2021-01-16 MED ORDER — IBUPROFEN 800 MG PO TABS: 800.0000 mg | ORAL_TABLET | Freq: Three times a day (TID) | ORAL | 0 refills | Status: DC | PRN

## 2021-01-16 MED ORDER — CYCLOBENZAPRINE HCL 5 MG PO TABS: 5.0000 mg | ORAL_TABLET | Freq: Three times a day (TID) | ORAL | 0 refills | Status: DC | PRN

## 2021-01-16 MED ORDER — HYDROCODONE-ACETAMINOPHEN 5-325 MG PO TABS: 1.0000 | ORAL_TABLET | Freq: Three times a day (TID) | ORAL | 0 refills | Status: DC | PRN

## 2021-01-16 MED ORDER — HYDROCODONE-ACETAMINOPHEN 5-325 MG PO TABS
1.0000 | ORAL_TABLET | Freq: Once | ORAL | Status: AC
  Administered 2021-01-16: 1 via ORAL
  Filled 2021-01-16: qty 1

## 2021-01-16 NOTE — ED Notes (Signed)
First Nurse Note: Pt to ED via POV for fall with left leg pain. Pt is in NAD

## 2021-01-16 NOTE — Discharge Instructions (Addendum)
Your ultrasound exam results are pending, since you decided to leave prior to results. Follow-up with your provider for continued symptoms. Return if needed.

## 2021-01-16 NOTE — ED Provider Notes (Signed)
[facility] Emergency Department Provider Note ____________________________________________  Time seen: 1245  I have reviewed the triage vital signs and the nursing notes.  HISTORY  Chief Complaint  Fall  HPI Debra Hogan is a 60 y.o. female presents to the ED for evaluation of left leg pain.   She reports a mechanical fall on Friday, which she suffered a contusion to her left lower leg.  Since that time she has had worsening swelling to the lower leg and edema distally.  She is also had pain and swelling to the anterior portion of the leg and pain with walking patient denies any head injury, loss of conscious, or other injury at the time for patient for any blood thinner therapy.  She does have a history of MS, and reports that it was the MS that resulted in the fall.  She presents now for evaluation of her left leg pain.  She denies any chest pain, fevers, or shortness of breath.  Past Medical History:  Diagnosis Date  . Anginal pain (HCC)   . Arthritis   . COPD (chronic obstructive pulmonary disease) (HCC)   . Depression   . Dyspnea   . Fibromyalgia   . Headache   . Heat stroke 2006  . Irritable bowel syndrome   . MS (multiple sclerosis) (HCC)    Followed by Dr. Sherryll Burger    Patient Active Problem List   Diagnosis Date Noted  . Renal cyst, right 06/22/2020  . DDD (degenerative disc disease), lumbar 04/27/2020  . Chronic low back pain with bilateral sciatica 04/27/2020  . DDD (degenerative disc disease), cervical 04/27/2020  . Facet arthropathy, cervical 04/27/2020  . Lumbar facet joint syndrome 04/27/2020  . Chronic, continuous use of opioids 04/27/2020  . Chronic pain syndrome 04/27/2020  . Pulmonary hypertension, unspecified (HCC) 09/29/2019  . Leg swelling 08/20/2019  . Insomnia 08/20/2019  . Chest pain 05/21/2019  . Muscle spasm 05/21/2019  . Fatigue 10/21/2018  . Encounter for smoking cessation counseling 10/21/2018  . Anxiety and depression  10/21/2018  . Left wrist pain 08/11/2018  . HTN (hypertension) 08/11/2018  . Atherosclerosis of aorta (HCC) 05/08/2018  . Elevated blood pressure reading 05/08/2018  . Rash 04/19/2018  . Needs flu shot 01/06/2018  . Cognitive change 10/08/2017  . Soft tissue mass 07/17/2017  . Right arm pain 06/29/2017  . Neuropathic pain 05/19/2017  . Palpitations 05/07/2017  . Cerebral aneurysm 05/01/2017  . HLD (hyperlipidemia) 04/05/2017  . Abdominal pain   . Multiple sclerosis (HCC)   . Esophageal reflux 10/21/2015  . COPD, moderate (HCC) 02/05/2015  . Generalized anxiety disorder 12/03/2014  . Routine physical examination 09/19/2013  . Lung nodule < 6cm on CT 05/22/2013  . Tobacco use disorder 05/22/2013  . Fibromyalgia 10/21/2012  . Genital herpes 10/21/2012  . Low back pain 10/21/2012  . Hypothyroidism 10/21/2012  . Myalgia and myositis 10/21/2012    Past Surgical History:  Procedure Laterality Date  . ABDOMINAL HYSTERECTOMY     CERVIX intact; had for noncancerous reasons, endometriosis  . CHOLECYSTECTOMY N/A 01/03/2016   Procedure: LAPAROSCOPIC CHOLECYSTECTOMY WITH INTRAOPERATIVE CHOLANGIOGRAM;  Surgeon: Lattie Haw, MD;  Location: [facility] ORS;  Service: General;  Laterality: N/A;  . ENDOSCOPIC RETROGRADE CHOLANGIOPANCREATOGRAPHY (ERCP) WITH PROPOFOL N/A 01/04/2016   Procedure: ENDOSCOPIC RETROGRADE CHOLANGIOPANCREATOGRAPHY (ERCP) WITH PROPOFOL;  Surgeon: Midge Minium, MD;  Location: [facility] ENDOSCOPY;  Service: Endoscopy;  Laterality: N/A;  . FOOT SURGERY  2001  . LAPAROSCOPIC ENDOMETRIOSIS FULGURATION    . RIGHT/LEFT  HEART CATH AND CORONARY ANGIOGRAPHY Bilateral 09/29/2019   Procedure: RIGHT/LEFT HEART CATH AND CORONARY ANGIOGRAPHY;  Surgeon: Yvonne Kendall, MD;  Location: [facility] INVASIVE CV LAB;  Service: Cardiovascular;  Laterality: Bilateral;    Prior to Admission medications   Medication Sig Start Date End Date Taking? Authorizing Provider  cyclobenzaprine (FLEXERIL) 5 MG tablet  Take 1 tablet (5 mg total) by mouth 3 (three) times daily as needed. 01/16/21  Yes Sheina Mcleish, Charlesetta Ivory, PA-C  HYDROcodone-acetaminophen (NORCO) 5-325 MG tablet Take 1 tablet by mouth 3 (three) times daily as needed. 01/16/21  Yes Annah Jasko, Charlesetta Ivory, PA-C  ibuprofen (ADVIL) 800 MG tablet Take 1 tablet (800 mg total) by mouth every 8 (eight) hours as needed. 01/16/21  Yes Harbert Fitterer, Charlesetta Ivory, PA-C  acyclovir (ZOVIRAX) 400 MG tablet Take 1 tablet (400 mg total) by mouth daily. 11/08/20   Allegra Grana, FNP  Albuterol Sulfate (PROAIR RESPICLICK) 108 (90 Base) MCG/ACT AEPB Inhale 90 mcg into the lungs every 6 (six) hours as needed (as needed for cough, wheezing). Patient taking differently: Inhale 180 mcg into the lungs every 6 (six) hours as needed (as needed for cough, wheezing). 04/05/18   Allegra Grana, FNP  ALPRAZolam Prudy Feeler) 0.25 MG tablet Take 1 tablet (0.25 mg total) by mouth 2 (two) times daily as needed for anxiety. 07/28/20   Allegra Grana, FNP  aspirin EC 81 MG tablet Take 81 mg by mouth daily.    [provider]  cholecalciferol (VITAMIN D) 1000 units tablet Take 1,000 Units by mouth daily.    [provider]  cyclobenzaprine (FLEXERIL) 10 MG tablet TAKE 1/2 TO 1 (ONE-HALF TO ONE) TABLET BY MOUTH TWICE DAILY AS NEEDED 06/07/20   [provider]  diclofenac sodium (VOLTAREN) 1 % GEL Apply 4 g topically 4 (four) times daily. 08/09/18   Allegra Grana, FNP  diltiazem (CARDIZEM CD) 180 MG 24 hr capsule Take 1 capsule (180 mg total) by mouth daily. 08/23/20 08/23/21  Alver Sorrow, NP  escitalopram (LEXAPRO) 20 MG tablet Take 1 tablet (20 mg total) by mouth daily. 11/08/20   Allegra Grana, FNP  Fluticasone-Salmeterol (ADVAIR DISKUS) 250-50 MCG/DOSE AEPB Inhale 1 puff into the lungs 2 (two) times daily. 05/21/19   Allegra Grana, FNP  gabapentin (NEURONTIN) 100 MG capsule Take 1 capsule (100 mg total) by mouth in the morning and at bedtime.  12/31/20   Allegra Grana, FNP  Lifitegrast Benay Spice) 5 % SOLN Apply to eye 2 (two) times daily.  Patient not taking: Reported on 12/31/2020    [provider]  lisinopril (ZESTRIL) 20 MG tablet Take 1 tablet (20 mg total) by mouth daily. 01/06/21   Allegra Grana, FNP  Multiple Vitamins-Minerals (CENTRUM SILVER ULTRA WOMENS) TABS Take 1 tablet by mouth daily.    [provider]  nitroGLYCERIN (NITROSTAT) 0.4 MG SL tablet Place 1 tablet (0.4 mg total) under the tongue every 5 (five) minutes as needed for chest pain. 09/29/19 09/28/20  End, Cristal Deer, MD  ondansetron (ZOFRAN-ODT) 4 MG disintegrating tablet Take 1 tablet (4 mg total) by mouth every 6 (six) hours as needed for nausea or vomiting. 01/06/21   Allegra Grana, FNP  rosuvastatin (CRESTOR) 20 MG tablet Take 1 tablet (20 mg total) by mouth daily. 11/08/20   Allegra Grana, FNP    Allergies Valium [diazepam]  Family History  Problem Relation Age of Onset  . Hypertension Mother   . Hyperlipidemia Mother   .  Arthritis Mother   . Cancer Father        throat  . Heart disease Brother   . Bipolar disorder Son   . Schizophrenia Son   . Cancer Maternal Grandfather        liver  . Cancer Paternal Grandmother        liver    Social History Social History   Tobacco Use  . Smoking status: Current Every Day Smoker    Packs/day: 1.00    Years: 43.00    Pack years: 43.00  . Smokeless tobacco: Never Used  Vaping Use  . Vaping Use: Never used  Substance Use Topics  . Alcohol use: No    Alcohol/week: 0.0 standard drinks  . Drug use: No    Review of Systems  Constitutional: Negative for fever. Eyes: Negative for visual changes. ENT: Negative for sore throat. Cardiovascular: Negative for chest pain. Respiratory: Negative for shortness of breath. Gastrointestinal: Negative for abdominal pain, vomiting and diarrhea. Genitourinary: Negative for dysuria. Musculoskeletal: Negative for back pain. LLE pain   Skin: Negative for rash. Neurological: Negative for headaches, focal weakness or numbness. ____________________________________________  PHYSICAL EXAM:  VITAL SIGNS: ED Triage Vitals  Enc Vitals Group     BP 01/16/21 1145 105/61     Pulse Rate 01/16/21 1145 96     Resp 01/16/21 1145 20     Temp 01/16/21 1145 98.6 F (37 C)     Temp Source 01/16/21 1145 Oral     SpO2 01/16/21 1145 96 %     Weight 01/16/21 1150 169 lb (76.7 kg)     Height 01/16/21 1150 5\' 5"  (1.651 m)     Head Circumference --      Peak Flow --      Pain Score 01/16/21 1149 5     Pain Loc --      Pain Edu? --      Excl. in GC? --     Constitutional: Alert and oriented. Well appearing and in no distress. Head: Normocephalic and atraumatic. Eyes: Conjunctivae are normal. Normal extraocular movements Cardiovascular: Normal rate, regular rhythm. Normal distal pulses and cap refill. Respiratory: Normal respiratory effort. No wheezes/rales/rhonchi. Gastrointestinal: Soft and nontender. No distention. Musculoskeletal: Left leg with soft tissue swelling and hematoma to the anterior proximal shin.  There is also some medial ecchymosis and bruising of the proximal shin.  Patient is also tender to palpation over the popliteal space and the calf.  There is dependent edema to the leg on the lower extremity.  Nontender with normal range of motion in all extremities.  Neurologic: Antalgic gait without ataxia. Normal speech and language. No gross focal neurologic deficits are appreciated. Skin:  Skin is warm, dry and intact. No rash noted.  No lesions, abrasions, or lacerations noted. ____________________________________________   RADIOLOGY  DG Left Tibia/Fibula   Negative  I, Elizette Shek V Bacon-Shirelle Tootle, personally viewed and evaluated these images (plain radiographs) as part of my medical decision making, as well as reviewing the written report by the  radiologist. ____________________________________________  PROCEDURES  Norco 5-325 mg PO  Procedures ____________________________________________  INITIAL IMPRESSION / ASSESSMENT AND PLAN / ED COURSE  ----------------------------------------- 3:12 PM on 01/16/2021 ----------------------------------------- Patient was not in the room when ultrasound came to retrieve her.  She had just in the 5 minutes before that asked to leave because she had not eaten all day, and her MS was causing her to have discomfort.  I advised the patient that if she could simply  wait long enough to get the ultrasound performed, that would at least allow me to have images to review and await results to rule out a DVT.  My suspicion is low, however the patient is having posterior pain related to her traumatic fall.  X-ray was negative for any acute fracture or dislocation.  She does have a moderate hematoma to the anterior proximal shin, as well as significant lower extremity edema.  Patient was agreeable to the plan to wait, but apparently had left prior to ultrasound retrieving her.  She was fully aware of her diagnosis, and prescriptions already been sent to the pharmacy for her benefit.  She was also advised to follow with primary provider for ongoing symptoms.  Patient will be dispositioned to AMA at this time.   Debra Hogan was evaluated in Emergency Department on 01/16/2021 for the symptoms described in the history of present illness. She was evaluated in the context of the global COVID-19 pandemic, which necessitated consideration that the patient might be at risk for infection with the SARS-CoV-2 virus that causes COVID-19. Institutional protocols and algorithms that pertain to the evaluation of patients at risk for COVID-19 are in a state of rapid change based on information released by regulatory bodies including the CDC and federal and state organizations. These policies and algorithms were followed during the  patient's care in the ED.  I reviewed the patient's prescription history over the last 12 months in the multi-state controlled substances database(s) that includes [place], [place], [place], [place], [place], [place], [place], [place], [place], [place], [place], [place], [place], and [place].  Results were notable for no current RX.  ____________________________________________  FINAL CLINICAL IMPRESSION(S) / ED DIAGNOSES  Final diagnoses:  Fall in home, initial encounter  Hematoma of leg, left, initial encounter      Lissa Hoard, PA-C 01/16/21 1516    Concha Se, MD 01/16/21 [phone]

## 2021-01-16 NOTE — ED Triage Notes (Signed)
Pt presents via POV with c/o mechanical fall on Friday. Pt c/o worsening pain in left knee area since. Pt able to walk but with extreme pain. Pt denies hitting head with fall. Pt does not take blood thinners.

## 2021-01-18 DIAGNOSIS — J449 Chronic obstructive pulmonary disease, unspecified: Secondary | ICD-10-CM | POA: Diagnosis not present

## 2021-01-24 ENCOUNTER — Other Ambulatory Visit: Payer: Medicare Other

## 2021-01-25 ENCOUNTER — Other Ambulatory Visit: Payer: Self-pay | Admitting: Family

## 2021-01-25 ENCOUNTER — Telehealth: Payer: Self-pay | Admitting: Family

## 2021-01-25 DIAGNOSIS — J449 Chronic obstructive pulmonary disease, unspecified: Secondary | ICD-10-CM

## 2021-01-25 MED ORDER — PROAIR RESPICLICK 108 (90 BASE) MCG/ACT IN AEPB: 1.0000 | INHALATION_SPRAY | Freq: Four times a day (QID) | RESPIRATORY_TRACT | 0 refills | Status: DC | PRN

## 2021-01-25 NOTE — Telephone Encounter (Signed)
Pt needs refill on Albuterol Sulfate (PROAIR RESPICLICK) 108 (90 Base) MCG/ACT AEPB sent to [facility]

## 2021-01-25 NOTE — Telephone Encounter (Signed)
Patient fell 1 week 2 days ago. Patient bruise left inside of the leg. Now her left ankle is turning purple and is now going into her foot. Patient went to ED on Sunday, no fractures, no breaks.

## 2021-01-26 ENCOUNTER — Encounter: Payer: Self-pay | Admitting: Family

## 2021-01-26 NOTE — Telephone Encounter (Signed)
Appointment has been scheduled.

## 2021-01-26 NOTE — Telephone Encounter (Signed)
Call pt Please offer an in person visit with myself or one of my colleagues . Unfortunately im out of the office today and tomorrow due to childcare and COVID exposure This doesn't need to wait  If no appts today or tomorrow within [place] , please advise [facility] or [facility] urgent [facility]

## 2021-01-26 NOTE — Telephone Encounter (Signed)
Can you triage her and see what is going on with her leg?

## 2021-01-27 ENCOUNTER — Ambulatory Visit: Admission: RE | Admit: 2021-01-27 | Payer: Medicare Other | Source: Ambulatory Visit

## 2021-01-27 ENCOUNTER — Ambulatory Visit
Admission: RE | Admit: 2021-01-27 | Discharge: 2021-01-27 | Disposition: A | Discharge status: Home / Self Care | Payer: Medicare Other | Source: Ambulatory Visit | Attending: Internal Medicine | Admitting: Internal Medicine

## 2021-01-27 ENCOUNTER — Ambulatory Visit (INDEPENDENT_AMBULATORY_CARE_PROVIDER_SITE_OTHER): Payer: Medicare Other | Admitting: Internal Medicine

## 2021-01-27 ENCOUNTER — Encounter: Payer: Self-pay | Admitting: Internal Medicine

## 2021-01-27 ENCOUNTER — Other Ambulatory Visit: Payer: Self-pay

## 2021-01-27 VITALS — BP 138/78 | HR 107 | Temp 98.5°F | Ht 65.0 in | Wt 172.4 lb

## 2021-01-27 DIAGNOSIS — M79605 Pain in left leg: Secondary | ICD-10-CM | POA: Diagnosis not present

## 2021-01-27 DIAGNOSIS — M7989 Other specified soft tissue disorders: Secondary | ICD-10-CM | POA: Diagnosis not present

## 2021-01-27 DIAGNOSIS — T148XXA Other injury of unspecified body region, initial encounter: Secondary | ICD-10-CM | POA: Diagnosis not present

## 2021-01-27 DIAGNOSIS — L03116 Cellulitis of left lower limb: Secondary | ICD-10-CM

## 2021-01-27 DIAGNOSIS — J449 Chronic obstructive pulmonary disease, unspecified: Secondary | ICD-10-CM

## 2021-01-27 MED ORDER — FLUTICASONE-SALMETEROL 250-50 MCG/DOSE IN AEPB: 1.0000 | INHALATION_SPRAY | Freq: Two times a day (BID) | RESPIRATORY_TRACT | 3 refills | Status: DC

## 2021-01-27 MED ORDER — HYDROCODONE-ACETAMINOPHEN 5-325 MG PO TABS
1.0000 | ORAL_TABLET | Freq: Three times a day (TID) | ORAL | 0 refills | Status: DC | PRN
Start: 2021-01-27 — End: 2021-04-13

## 2021-01-27 MED ORDER — DOXYCYCLINE HYCLATE 100 MG PO TABS: 100.0000 mg | ORAL_TABLET | Freq: Two times a day (BID) | ORAL | 0 refills | Status: DC

## 2021-01-27 NOTE — Patient Instructions (Signed)
You Cobaugh have at Blood clot/DVT in your  Left lower leg  And Seroma below your knee -read about on [facility] or [facility]   Deep Vein Thrombosis  Deep vein thrombosis (DVT) is a condition in which a blood clot forms in a deep vein, such as a vein in the lower leg, thigh, pelvis, or arm. Deep veins are veins in the deep venous system. A clot is blood that has thickened into a gel or solid. This condition is serious and can be life-threatening if the clot travels to the lungs and causes a blockage (pulmonary embolism) in the arteries of the lung. A DVT can also damage veins in the leg. This can lead to long-term, or chronic, venous disease, leg pain, swelling, discoloration, and ulcers or sores (post-thrombotic syndrome). What are the causes? This condition Kimmel be caused by:  A slowdown of blood flow.  Damage to a vein.  A condition that causes blood to clot more easily, such as certain blood-clotting disorders. What increases the risk? The following factors Bacot make you more likely to develop this condition:  Having obesity.  Being older, especially older than age 43.  Being inactive (sedentary lifestyle) or not moving around. This Flemmer include: ? Sitting or lying down for longer than 4-6 hours other than to sleep at night. ? Being in the hospital, having major or lengthy surgery, or having a thin, flexible tube (central line catheter) placed in a large vein.  Being pregnant, giving birth, or having recently given birth.  Taking medicines that contain estrogen, such as birth control or hormone replacement therapy.  Using products that contain nicotine or tobacco, especially if you use hormonal birth control.  Having a history of blood clots or a blood-clotting disease, a blood vessel disease (peripheral vascular disease), or congestive heart disease.  Having a history of cancer, especially if being treated with chemotherapy. What are the signs or symptoms? Symptoms of this condition  include:  Swelling, pain, pressure, or tenderness in an arm or a leg.  An arm or a leg becoming warm, red, or discolored.  A leg turning very pale. You [place] have a large DVT. This is rare. If the clot is in your leg, you Pardini notice symptoms more or have worse symptoms when you stand or walk. In some cases, there are no symptoms. How is this diagnosed? This condition is diagnosed with:  Your medical history and a physical exam.  Tests, such as: ? Blood tests to check how well your blood clots. ? Doppler ultrasound. This is the best way to find a DVT. ? Venogram. Contrast dye is injected into a vein, and X-rays are taken to check for clots. How is this treated? Treatment for this condition depends on:  The cause of your DVT.  The size and location of your DVT, or having more than one DVT.  Your risk for bleeding or developing more clots.  Other medical conditions you Fettig have. Treatment Torosian include:  Taking a blood thinner, also called an anticoagulant, to prevent clots from forming and growing.  Wearing compression stockings, if directed.  Injecting medicines into the affected vein to break up the clot (catheter-directed thrombolysis). This is used only for severe DVT and only if a specialist recommends it.  Specific surgical procedures, when DVT is severe or hard to treat. These Bastarache be done to: ? Isolate and remove your clot. ? Place an inferior vena cava (IVC) filter in a large vein to catch blood  clots before they reach your lungs. You Shutt get some medical treatments for 6 months or longer. Follow these instructions at home: If you are taking blood thinners:  Talk with your health care provider before you take any medicines that contain aspirin or NSAIDs, such as ibuprofen. These medicines increase your risk for dangerous bleeding.  Take your medicine exactly as told, at the same time every day. Do not skip a dose. Do not take more than the prescribed dose. This is  important.  Ask your health care provider about foods and medicines that could change the way your blood thinner works (Yandell interact). Avoid these foods and medicines if you are told to do so.  Avoid anything that Howse cause bleeding or bruising. You Taira bleed more easily while taking blood thinners. ? Be very careful when using knives, scissors, or other sharp objects. ? Use an electric razor instead of a blade. ? Avoid activities that could cause injury or bruising, and follow instructions for preventing falls. ? Tell your health care provider if you have had any internal bleeding, bleeding ulcers, or neurologic diseases, such as strokes or cerebral aneurysms.  Wear a medical alert bracelet or carry a card that lists what medicines you take. General instructions  Take over-the-counter and prescription medicines only as told by your health care provider.  Return to your normal activities as told by your health care provider. Ask your health care provider what activities are safe for you.  If recommended, wear compression stockings as told by your health care provider. These stockings help to prevent blood clots and reduce swelling in your legs.  Keep all follow-up visits as told by your health care provider. This is important. Contact a health care provider if:  You miss a dose of your blood thinner.  You have new or worse pain, swelling, or redness in an arm or a leg.  You have worsening numbness or tingling in an arm or a leg.  You have unusual bruising. Get help right away if:  You have signs or symptoms that a blood clot has moved to the lungs. These Mabee include: ? Shortness of breath. ? Chest pain. ? Fast or irregular heartbeats (palpitations). ? Light-headedness or dizziness. ? Coughing up blood.  You have signs or symptoms that your blood is too thin. These Straka include: ? Blood in your vomit, stool, or urine. ? A cut that will not stop bleeding. ? A menstrual period  that is heavier than usual. ? A severe headache or confusion. These symptoms Sumlin represent a serious problem that is an emergency. Do not wait to see if the symptoms will go away. Get medical help right away. Call your local emergency services (911 in the [place] Do not drive yourself to the hospital. Summary  Deep vein thrombosis (DVT) happens when a blood clot forms in a deep vein. This Farney occur in the lower leg, thigh, pelvis, or arm.  Symptoms affect the arm or leg and can include swelling, pain, tenderness, warmth, redness, or discoloration.  This condition Inga be treated with medicines or compression stockings. In severe cases, surgery Mcclard be done.  If you are taking blood thinners, take them exactly as told. Do not skip a dose. Do not take more than is prescribed.  Get help right away if you have shortness of breath, chest pain, fast or irregular heartbeats, or blood in your vomit, urine, or stool. This information is not intended to replace advice given to you by  your health care provider. Make sure you discuss any questions you have with your health care provider. Document Revised: 12/06/2019 Document Reviewed: 12/06/2019 Elsevier Patient Education  2021 [employer].

## 2021-01-27 NOTE — Progress Notes (Addendum)
Chief Complaint  Patient presents with  . Leg Swelling    Pt. Reports fall with injury to left leg; presents leg swollen, red, appears to be bruised. Left foot also swollen/bruised; pt said she did not hurt her foot when she fell. Pt rated pain 5/10.   Fall going up cement steps to moms house 01/14/21 swelling immediately Xray 01/16/21 negative tib fibula swelling knee down to foot, redness no open cuts with the fall. Left lower leg is red to blue and swollen with bulging veins from knee to ankle and prominent swelling left lower extremity below knee 3-4 days ago ankle started turning blude  Tried norco but in pain 5/10   Copd needs refill advair   Review of Systems  Cardiovascular: Positive for leg swelling.   Past Medical History:  Diagnosis Date  . Anginal pain (HCC)   . Arthritis   . COPD (chronic obstructive pulmonary disease) (HCC)   . Depression   . Dyspnea   . Fibromyalgia   . Headache   . Heat stroke 2006  . Irritable bowel syndrome   . MS (multiple sclerosis) (HCC)    Followed by Dr. Sherryll Burger   Past Surgical History:  Procedure Laterality Date  . ABDOMINAL HYSTERECTOMY     CERVIX intact; had for noncancerous reasons, endometriosis  . CHOLECYSTECTOMY N/A 01/03/2016   Procedure: LAPAROSCOPIC CHOLECYSTECTOMY WITH INTRAOPERATIVE CHOLANGIOGRAM;  Surgeon: Lattie Haw, MD;  Location: [facility];  Service: General;  Laterality: N/A;  . ENDOSCOPIC RETROGRADE CHOLANGIOPANCREATOGRAPHY (ERCP) WITH PROPOFOL N/A 01/04/2016   Procedure: ENDOSCOPIC RETROGRADE CHOLANGIOPANCREATOGRAPHY (ERCP) WITH PROPOFOL;  Surgeon: Midge Minium, MD;  Location: [facility];  Service: Endoscopy;  Laterality: N/A;  . FOOT SURGERY  2001  . LAPAROSCOPIC ENDOMETRIOSIS FULGURATION    . RIGHT/LEFT HEART CATH AND CORONARY ANGIOGRAPHY Bilateral 09/29/2019   Procedure: RIGHT/LEFT HEART CATH AND CORONARY ANGIOGRAPHY;  Surgeon: Yvonne Kendall, MD;  Location: [facility];  Service: Cardiovascular;   Laterality: Bilateral;   Family History  Problem Relation Age of Onset  . Hypertension Mother   . Hyperlipidemia Mother   . Arthritis Mother   . Cancer Father        throat  . Heart disease Brother   . Bipolar disorder Son   . Schizophrenia Son   . Cancer Maternal Grandfather        liver  . Cancer Paternal Grandmother        liver   Social History   Socioeconomic History  . Marital status: Married    Spouse name: Not on file  . Number of children: Not on file  . Years of education: Not on file  . Highest education level: Not on file  Occupational History  . Not on file  Tobacco Use  . Smoking status: Current Every Day Smoker    Packs/day: 1.00    Years: 43.00    Pack years: 43.00  . Smokeless tobacco: Never Used  Vaping Use  . Vaping Use: Never used  Substance and Sexual Activity  . Alcohol use: No    Alcohol/week: 0.0 standard drinks  . Drug use: No  . Sexual activity: Not Currently    Birth control/protection: Post-menopausal  Other Topics Concern  . Not on file  Social History Narrative   Lives in [place] with son. Widowed x 2.      Married for 10 years.       On disability      Social Determinants of Health   Financial Resource Strain:  Not on file  Food Insecurity: Not on file  Transportation Needs: Not on file  Physical Activity: Not on file  Stress: Not on file  Social Connections: Not on file  Intimate Partner Violence: Not on file   No outpatient medications have been marked as taking for the 01/27/21 encounter (Office Visit) with McLean-Scocuzza, Pasty Spillers, MD.   Allergies  Allergen Reactions  . Valium [Diazepam] Anxiety   No results found for this or any previous visit (from the past 2160 hour(s)). Objective  Body mass index is 28.68 kg/m. Wt Readings from Last 3 Encounters:  01/27/21 172 lb 6 oz (78.2 kg)  01/16/21 169 lb (76.7 kg)  12/31/20 169 lb (76.7 kg)   Temp Readings from Last 3 Encounters:  01/27/21 98.5 F (36.9 C)   01/16/21 98.6 F (37 C) (Oral)  10/07/20 (!) 97.1 F (36.2 C) (Temporal)   BP Readings from Last 3 Encounters:  01/27/21 138/78  01/16/21 105/61  10/07/20 124/73   Pulse Readings from Last 3 Encounters:  01/27/21 (!) 107  01/16/21 96  10/07/20 87    Physical Exam Vitals and nursing note reviewed.  Constitutional:      Appearance: Normal appearance. She is well-developed and well-groomed.     Comments: In wheelchair today  HENT:     Head: Normocephalic and atraumatic.  Eyes:     Conjunctiva/sclera: Conjunctivae normal.     Pupils: Pupils are equal, round, and reactive to light.  Cardiovascular:     Rate and Rhythm: Tachycardia present.     Comments: LLE redness/swelling with blue hue lower leg normal DP/PT pulse left lower leg  Pulmonary:     Comments: O2 today 2L baseline o2  Musculoskeletal:     Left lower leg: Edema present.  Skin:    General: Skin is warm and dry.  Neurological:     General: No focal deficit present.     Mental Status: She is alert and oriented to person, place, and time. Mental status is at baseline.     Comments: In wheelchair   Psychiatric:        Attention and Perception: Attention and perception normal.        Mood and Affect: Mood and affect normal.        Speech: Speech normal.        Behavior: Behavior normal. Behavior is cooperative.        Thought Content: Thought content normal.        Cognition and Memory: Cognition and memory normal.        Judgment: Judgment normal.     Assessment  Plan  Left leg swelling - Plan: US Venous Img Lower Unilateral Left, [place] LT LOWER EXTREM LTD SOFT TISSUE NON VASCULAR, HYDROcodone-acetaminophen (NORCO) 5-325 MG tablet Doxycycline 100 mg bid with food x 10 days   IMPRESSION: No femoropopliteal DVT within the left lower extremity.  5.7 cm complex fluid collection within the subcutaneous soft tissues of the medial left calf possibly representing a subcutaneous hematoma.  Mayor need vascular  and or ortho referral asap   COPD, moderate (HCC) - Plan: Fluticasone-Salmeterol (ADVAIR DISKUS) 250-50 MCG/DOSE AEPB Baseline on 2L O2    Provider: Dr. French Ana McLean-Scocuzza-Internal Medicine

## 2021-01-27 NOTE — Addendum Note (Signed)
Addended by: Quentin Ore on: 01/27/2021 05:03 PM   Modules accepted: Orders

## 2021-02-01 DIAGNOSIS — R2242 Localized swelling, mass and lump, left lower limb: Secondary | ICD-10-CM | POA: Diagnosis not present

## 2021-02-07 ENCOUNTER — Other Ambulatory Visit: Payer: Medicare Other

## 2021-02-08 ENCOUNTER — Other Ambulatory Visit (INDEPENDENT_AMBULATORY_CARE_PROVIDER_SITE_OTHER): Payer: Medicare Other

## 2021-02-08 ENCOUNTER — Other Ambulatory Visit: Payer: Self-pay

## 2021-02-08 DIAGNOSIS — I10 Essential (primary) hypertension: Secondary | ICD-10-CM

## 2021-02-09 ENCOUNTER — Ambulatory Visit (INDEPENDENT_AMBULATORY_CARE_PROVIDER_SITE_OTHER): Payer: Medicare Other | Admitting: Family

## 2021-02-09 VITALS — BP 126/88 | HR 110 | Temp 99.0°F | Ht 65.0 in | Wt 172.0 lb

## 2021-02-09 DIAGNOSIS — S8012XS Contusion of left lower leg, sequela: Secondary | ICD-10-CM

## 2021-02-09 DIAGNOSIS — I10 Essential (primary) hypertension: Secondary | ICD-10-CM

## 2021-02-09 DIAGNOSIS — E038 Other specified hypothyroidism: Secondary | ICD-10-CM | POA: Diagnosis not present

## 2021-02-09 LAB — BASIC METABOLIC PANEL
BUN: 12 mg/dL (ref 6–23)
CO2: 36 mEq/L — ABNORMAL HIGH (ref 19–32)
Calcium: 9.5 mg/dL (ref 8.4–10.5)
Chloride: 101 mEq/L (ref 96–112)
Creatinine, Ser: 0.85 mg/dL (ref 0.40–1.20)
GFR: 73.94 mL/min (ref >60.00)
Glucose, Bld: 98 mg/dL (ref 70–99)
Potassium: 3.4 mEq/L — ABNORMAL LOW (ref 3.5–5.1)
Sodium: 142 mEq/L (ref 135–145)

## 2021-02-09 LAB — CBC WITH DIFFERENTIAL/PLATELET
Basophils Absolute: 0.1 10*3/uL (ref 0.0–0.1)
Basophils Relative: 0.9 % (ref 0.0–3.0)
Eosinophils Absolute: 0.1 10*3/uL (ref 0.0–0.7)
Eosinophils Relative: 1.7 % (ref 0.0–5.0)
HCT: 37.1 % (ref 36.0–46.0)
Hemoglobin: 12.6 g/dL (ref 12.0–15.0)
Lymphocytes Relative: 13.2 % (ref 12.0–46.0)
Lymphs Abs: 0.9 10*3/uL (ref 0.7–4.0)
MCHC: 34.1 g/dL (ref 30.0–36.0)
MCV: 93.1 fl (ref 78.0–100.0)
Monocytes Absolute: 0.6 10*3/uL (ref 0.1–1.0)
Monocytes Relative: 9.7 % (ref 3.0–12.0)
Neutro Abs: 4.9 10*3/uL (ref 1.4–7.7)
Neutrophils Relative %: 74.5 % (ref 43.0–77.0)
Platelets: 212 10*3/uL (ref 150.0–400.0)
RBC: 3.98 Mil/uL (ref 3.87–5.11)
RDW: 13 % (ref 11.5–15.5)
WBC: 6.6 10*3/uL (ref 4.0–10.5)

## 2021-02-09 LAB — COMPREHENSIVE METABOLIC PANEL
ALT: 13 U/L (ref 0–35)
AST: 20 U/L (ref 0–37)
Albumin: 4 g/dL (ref 3.5–5.2)
Alkaline Phosphatase: 81 U/L (ref 39–117)
BUN: 12 mg/dL (ref 6–23)
CO2: 36 mEq/L — ABNORMAL HIGH (ref 19–32)
Calcium: 9.5 mg/dL (ref 8.4–10.5)
Chloride: 101 mEq/L (ref 96–112)
Creatinine, Ser: 0.85 mg/dL (ref 0.40–1.20)
GFR: 73.94 mL/min (ref >60.00)
Glucose, Bld: 98 mg/dL (ref 70–99)
Potassium: 3.4 mEq/L — ABNORMAL LOW (ref 3.5–5.1)
Sodium: 142 mEq/L (ref 135–145)
Total Bilirubin: 0.4 mg/dL (ref 0.2–1.2)
Total Protein: 6.5 g/dL (ref 6.0–8.3)

## 2021-02-09 LAB — LIPID PANEL
Cholesterol: 151 mg/dL (ref 0–200)
HDL: 45.6 mg/dL (ref >39.00)
NonHDL: 105.62
Total CHOL/HDL Ratio: 3
Triglycerides: 300 mg/dL — ABNORMAL HIGH (ref 0.0–149.0)
VLDL: 60 mg/dL — ABNORMAL HIGH (ref 0.0–40.0)

## 2021-02-09 LAB — VITAMIN D 25 HYDROXY (VIT D DEFICIENCY, FRACTURES): VITD: 46.28 ng/mL (ref 30.00–100.00)

## 2021-02-09 LAB — LDL CHOLESTEROL, DIRECT: Direct LDL: 69 mg/dL

## 2021-02-09 LAB — HEMOGLOBIN A1C: Hgb A1c MFr Bld: 5 % (ref 4.6–6.5)

## 2021-02-09 LAB — TSH: TSH: 5.4 u[IU]/mL — ABNORMAL HIGH (ref 0.35–4.50)

## 2021-02-09 MED ORDER — POTASSIUM CHLORIDE ER 10 MEQ PO TBCR
10.0000 meq | EXTENDED_RELEASE_TABLET | Freq: Every day | ORAL | 1 refills | Status: DC
Start: 2021-02-09 — End: 2022-04-05

## 2021-02-09 MED ORDER — HYDROCHLOROTHIAZIDE 12.5 MG PO CAPS: 12.5000 mg | ORAL_CAPSULE | Freq: Every day | ORAL | 0 refills | Status: DC

## 2021-02-09 MED ORDER — LEVOTHYROXINE SODIUM 25 MCG PO TABS: 25.0000 ug | ORAL_TABLET | Freq: Every day | ORAL | 3 refills | Status: DC

## 2021-02-09 NOTE — Progress Notes (Signed)
Subjective:    Patient ID: Debra Hogan, female    DOB: Prins 29, 1962, 60 y.o.   MRN: [id number]  CC: Debra Hogan is a 60 y.o. female who presents today for follow up.   HPI: Follow up left leg swelling 3 weeks ago, improved Suffered injury to left leg 01/14/21   Bruising has resolved. Swelling right medial below knee cap as well left lower leg which has improved. Left calf swelling has improved. Swelling now only when walking or standing for long periods. Improves when supine, with elevation.  No fever, redness of leg, increased heat.  She was seen by [facility] however scanned visit is yet in Epic for review. She was told to elevate leg per orthopedic. No further intervention per patient.   01/16/21 XR tib/fib negative for fracture.  She has completed course of doxycycline however she is not sure whom prescribed. ( I cannot see in Epic)  [place] ultrasound left lower leg 01/26/21 showed negative DVT, 5.7 cm complex fluid collection , suspected hematoma.  She has always had bilateral feet swelling, worse after standing for years. Left leg worsened with injury.   Breathing at baseline. No increased SOB    HISTORY:  Past Medical History:  Diagnosis Date   Anginal pain (HCC)    Arthritis    COPD (chronic obstructive pulmonary disease) (HCC)    Depression    Dyspnea    Fibromyalgia    Headache    Heat stroke 2006   Irritable bowel syndrome    MS (multiple sclerosis) (HCC)    Followed by Dr. Sherryll Burger   Past Surgical History:  Procedure Laterality Date   ABDOMINAL HYSTERECTOMY     CERVIX intact; had for noncancerous reasons, endometriosis   CHOLECYSTECTOMY N/A 01/03/2016   Procedure: LAPAROSCOPIC CHOLECYSTECTOMY WITH INTRAOPERATIVE CHOLANGIOGRAM;  Surgeon: Lattie Haw, MD;  Location: [facility];  Service: General;  Laterality: N/A;   ENDOSCOPIC RETROGRADE CHOLANGIOPANCREATOGRAPHY (ERCP) WITH PROPOFOL N/A 01/04/2016   Procedure: ENDOSCOPIC RETROGRADE CHOLANGIOPANCREATOGRAPHY  (ERCP) WITH PROPOFOL;  Surgeon: Midge Minium, MD;  Location: [facility];  Service: Endoscopy;  Laterality: N/A;   FOOT SURGERY  2001   LAPAROSCOPIC ENDOMETRIOSIS FULGURATION     RIGHT/LEFT HEART CATH AND CORONARY ANGIOGRAPHY Bilateral 09/29/2019   Procedure: RIGHT/LEFT HEART CATH AND CORONARY ANGIOGRAPHY;  Surgeon: Yvonne Kendall, MD;  Location: [facility];  Service: Cardiovascular;  Laterality: Bilateral;   Family History  Problem Relation Age of Onset   Hypertension Mother    Hyperlipidemia Mother    Arthritis Mother    Cancer Father        throat   Heart disease Brother    Bipolar disorder Son    Schizophrenia Son    Cancer Maternal Grandfather        liver   Cancer Paternal Grandmother        liver    Allergies: Valium [diazepam] Current Outpatient Medications on File Prior to Visit  Medication Sig Dispense Refill   acyclovir (ZOVIRAX) 400 MG tablet Take 1 tablet (400 mg total) by mouth daily. 90 tablet 3   Albuterol Sulfate (PROAIR RESPICLICK) 108 (90 Base) MCG/ACT AEPB Inhale 1 puff into the lungs every 6 (six) hours as needed (as needed for cough, wheezing). 1 each 0   ALPRAZolam (XANAX) 0.25 MG tablet Take 1 tablet (0.25 mg total) by mouth 2 (two) times daily as needed for anxiety. 20 tablet 1   aspirin EC 81 MG tablet Take 81 mg by mouth daily.  cholecalciferol (VITAMIN D) 1000 units tablet Take 1,000 Units by mouth daily.     cyclobenzaprine (FLEXERIL) 10 MG tablet TAKE 1/2 TO 1 (ONE-HALF TO ONE) TABLET BY MOUTH TWICE DAILY AS NEEDED     diclofenac sodium (VOLTAREN) 1 % GEL Apply 4 g topically 4 (four) times daily. 1 Tube 3   diltiazem (CARDIZEM CD) 180 MG 24 hr capsule Take 1 capsule (180 mg total) by mouth daily. 30 capsule 5   escitalopram (LEXAPRO) 20 MG tablet Take 1 tablet (20 mg total) by mouth daily. 90 tablet 0   Fluticasone-Salmeterol (ADVAIR DISKUS) 250-50 MCG/DOSE AEPB Inhale 1 puff into the lungs 2 (two) times daily. 1  each 3   gabapentin (NEURONTIN) 100 MG capsule Take 1 capsule (100 mg total) by mouth in the morning and at bedtime. 60 capsule 4   HYDROcodone-acetaminophen (NORCO) 5-325 MG tablet Take 1 tablet by mouth 3 (three) times daily as needed. 10 tablet 0   lisinopril (ZESTRIL) 20 MG tablet Take 1 tablet (20 mg total) by mouth daily. 90 tablet 3   Multiple Vitamins-Minerals (CENTRUM SILVER ULTRA WOMENS) TABS Take 1 tablet by mouth daily.     ondansetron (ZOFRAN-ODT) 4 MG disintegrating tablet Take 1 tablet (4 mg total) by mouth every 6 (six) hours as needed for nausea or vomiting. 20 tablet 2   rosuvastatin (CRESTOR) 20 MG tablet Take 1 tablet (20 mg total) by mouth daily. 90 tablet 0   nitroGLYCERIN (NITROSTAT) 0.4 MG SL tablet Place 1 tablet (0.4 mg total) under the tongue every 5 (five) minutes as needed for chest pain. 25 tablet 3   No current facility-administered medications on file prior to visit.    Social History   Tobacco Use   Smoking status: Current Every Day Smoker    Packs/day: 1.00    Years: 43.00    Pack years: 43.00   Smokeless tobacco: Never Used  Building services engineer Use: Never used  Substance Use Topics   Alcohol use: No    Alcohol/week: 0.0 standard drinks   Drug use: No    Review of Systems  Constitutional: Negative for chills and fever.  Respiratory: Positive for shortness of breath (chronic , baseline). Negative for cough.   Cardiovascular: Positive for leg swelling. Negative for chest pain and palpitations.  Gastrointestinal: Negative for nausea and vomiting.  Skin: Negative for rash and wound.      Objective:    BP 126/88    Pulse (!) 110    Temp 99 F (37.2 C) (Oral)    Ht 5\' 5"  (1.651 m)    Wt 172 lb (78 kg)    SpO2 98%    BMI 28.62 kg/m  BP Readings from Last 3 Encounters:  02/09/21 126/88  01/27/21 138/78  01/16/21 105/61   Wt Readings from Last 3 Encounters:  02/09/21 172 lb (78 kg)  01/27/21 172 lb 6 oz (78.2 kg)  01/16/21 169 lb  (76.7 kg)    Physical Exam Vitals reviewed.  Constitutional:      Appearance: She is well-developed and well-nourished.  Eyes:     Conjunctiva/sclera: Conjunctivae normal.  Cardiovascular:     Rate and Rhythm: Normal rate and regular rhythm.     Pulses: Normal pulses.     Heart sounds: Normal heart sounds.     Comments: I removed ace wrap of left left leg to evaluate her leg. LLE edema + 2, non pitting. No palpable cords or masses or pain with palpation of  calf. No erythema or increased warmth. No asymmetry in calf size when compared bilaterally.  Noted soft,  Tender mass left medial knee, just below patella. No ecchymosis. Slight tenderness.    LE warm and palpable pedal pulses.  RLE edema +1 . No palpable cords or masses or pain with palpation of calf. No erythema or increased warmth.  Pulmonary:     Effort: Pulmonary effort is normal.     Breath sounds: Normal breath sounds. No wheezing, rhonchi or rales.  Skin:    General: Skin is warm and dry.  Neurological:     Mental Status: She is alert.  Psychiatric:        Mood and Affect: Mood and affect normal.        Speech: Speech normal.        Behavior: Behavior normal.        Thought Content: Thought content normal.        Assessment & Plan:   Problem List Items Addressed This Visit      Cardiovascular and Mediastinum   HTN (hypertension) - Primary   Relevant Medications   hydrochlorothiazide (MICROZIDE) 12.5 MG capsule   potassium chloride (KLOR-CON) 10 MEQ tablet   Other Relevant Orders   Basic metabolic panel     Endocrine   Subclinical hypothyroidism    In setting of chronic fatigue which I suspect is multifactorial ( COPD, MS, lack of exercise) patient was most comfortable with trial of synthroid to see if conferred benefit. Close follow up.      Relevant Medications   levothyroxine (SYNTHROID) 25 MCG tablet   Other Relevant Orders   T4, free   T3, free     Other   Leg hematoma, left, sequela     Improving. No evidence of infection.  Patient declines repeat doppler studies to assess fluid collection. In the setting of chronic bilateral LE edema, agreed trial of hctz with potassium chloride [place] offer benefit. Advised to ice 3-4 times per day and elevate. She will let me know how she is doing.             I have discontinued Kainat D. Makki's Xiidra, ibuprofen, and doxycycline. I am also having her start on hydrochlorothiazide, potassium chloride, and levothyroxine. Additionally, I am having her maintain her Centrum Silver [employer], cholecalciferol, diclofenac sodium, nitroGLYCERIN, aspirin EC, cyclobenzaprine, ALPRAZolam, diltiazem, escitalopram, rosuvastatin, acyclovir, gabapentin, ondansetron, lisinopril, ProAir RespiClick, Fluticasone-Salmeterol, and HYDROcodone-acetaminophen.   Meds ordered this encounter  Medications   hydrochlorothiazide (MICROZIDE) 12.5 MG capsule    Sig: Take 1 capsule (12.5 mg total) by mouth daily.    Dispense:  90 capsule    Refill:  0    Order Specific Question:   Supervising Provider    Answer:   Duncan Dull L [[id number]]   potassium chloride (KLOR-CON) 10 MEQ tablet    Sig: Take 1 tablet (10 mEq total) by mouth daily.    Dispense:  90 tablet    Refill:  1    Order Specific Question:   Supervising Provider    Answer:   Sherlene Shams [[id number]]   levothyroxine (SYNTHROID) 25 MCG tablet    Sig: Take 1 tablet (25 mcg total) by mouth daily before breakfast. Take one tablet daily before breakfast on empty stomach.    Dispense:  90 tablet    Refill:  3    Order Specific Question:   Supervising Provider    Answer:   Duncan Dull L [[id number]]  Return precautions given.   Risks, benefits, and alternatives of the medications and treatment plan prescribed today were discussed, and patient expressed understanding.   Education regarding symptom management and diagnosis given to patient on AVS.  Continue to follow with Allegra Grana, FNP for  routine health maintenance.   Aphrodite D Silversmith and I agreed with plan.   Rennie Plowman, FNP

## 2021-02-09 NOTE — Assessment & Plan Note (Addendum)
Improving. No evidence of infection.  Patient declines repeat doppler studies to assess fluid collection. In the setting of chronic bilateral LE edema, agreed trial of hctz with potassium chloride [place] offer benefit. Advised to ice 3-4 times per day and elevate. She will let me know how she is doing.

## 2021-02-09 NOTE — Patient Instructions (Addendum)
Your lab work shows subclinical hypothyroidism.  We can trial low dose synthroid. You will need lab for THYROID in 6 weeks; please schedule.   Please also ensure you are taking synthroid correctly as this can affect thyroid levels-  Take Synthroid once a day, every day at the same time before breakfast. Take Synthroid with only water and on an empty stomach. Wait 30 minutes to 1 hour before eating or drinking anything other than water.Please also separated by >4 hours from acid reflux medications, calcium, iron, multivitamins as Vadala interfere with absorption.    Trial of hctz - which Crespin bring fluid off of legs. We will need to start potassium to take daily with this medication. LABS in one week to check electrolytes.   ELEVATE toes above nose.   ICE ICE ICE 3-4 times per day . 20 minutes per day  Let me know of any worsening swelling, redness.

## 2021-02-10 ENCOUNTER — Ambulatory Visit: Payer: Medicare Other | Admitting: Nurse Practitioner

## 2021-02-10 NOTE — Progress Notes (Deleted)
Office Visit    Patient Name: Debra Hogan Date of Encounter: 02/10/2021  Primary Care Provider:  Allegra Grana, FNP Primary Cardiologist:  Lorine Bears, MD  Chief Complaint    ***  Past Medical History    Past Medical History:  Diagnosis Date  . Anginal pain (HCC)   . Arthritis   . COPD (chronic obstructive pulmonary disease) (HCC)   . Depression   . Dyspnea   . Fibromyalgia   . Headache   . Heat stroke 2006  . Irritable bowel syndrome   . MS (multiple sclerosis) (HCC)    Followed by Dr. Sherryll Burger   Past Surgical History:  Procedure Laterality Date  . ABDOMINAL HYSTERECTOMY     CERVIX intact; had for noncancerous reasons, endometriosis  . CHOLECYSTECTOMY N/A 01/03/2016   Procedure: LAPAROSCOPIC CHOLECYSTECTOMY WITH INTRAOPERATIVE CHOLANGIOGRAM;  Surgeon: Lattie Haw, MD;  Location: [facility];  Service: General;  Laterality: N/A;  . ENDOSCOPIC RETROGRADE CHOLANGIOPANCREATOGRAPHY (ERCP) WITH PROPOFOL N/A 01/04/2016   Procedure: ENDOSCOPIC RETROGRADE CHOLANGIOPANCREATOGRAPHY (ERCP) WITH PROPOFOL;  Surgeon: Midge Minium, MD;  Location: [facility];  Service: Endoscopy;  Laterality: N/A;  . FOOT SURGERY  2001  . LAPAROSCOPIC ENDOMETRIOSIS FULGURATION    . RIGHT/LEFT HEART CATH AND CORONARY ANGIOGRAPHY Bilateral 09/29/2019   Procedure: RIGHT/LEFT HEART CATH AND CORONARY ANGIOGRAPHY;  Surgeon: Yvonne Kendall, MD;  Location: [facility];  Service: Cardiovascular;  Laterality: Bilateral;    Allergies  Allergies  Allergen Reactions  . Valium [Diazepam] Anxiety    History of Present Illness    ***  Home Medications    Prior to Admission medications   Medication Sig Start Date End Date Taking? Authorizing Provider  acyclovir (ZOVIRAX) 400 MG tablet Take 1 tablet (400 mg total) by mouth daily. 11/08/20   Allegra Grana, FNP  Albuterol Sulfate (PROAIR RESPICLICK) 108 (90 Base) MCG/ACT AEPB Inhale 1 puff into the lungs every 6 (six) hours as needed  (as needed for cough, wheezing). 01/25/21   Allegra Grana, FNP  ALPRAZolam Prudy Feeler) 0.25 MG tablet Take 1 tablet (0.25 mg total) by mouth 2 (two) times daily as needed for anxiety. 07/28/20   Allegra Grana, FNP  aspirin EC 81 MG tablet Take 81 mg by mouth daily.    [provider]  cholecalciferol (VITAMIN D) 1000 units tablet Take 1,000 Units by mouth daily.    [provider]  cyclobenzaprine (FLEXERIL) 10 MG tablet TAKE 1/2 TO 1 (ONE-HALF TO ONE) TABLET BY MOUTH TWICE DAILY AS NEEDED 06/07/20   [provider]  diclofenac sodium (VOLTAREN) 1 % GEL Apply 4 g topically 4 (four) times daily. 08/09/18   Allegra Grana, FNP  diltiazem (CARDIZEM CD) 180 MG 24 hr capsule Take 1 capsule (180 mg total) by mouth daily. 08/23/20 08/23/21  Alver Sorrow, NP  escitalopram (LEXAPRO) 20 MG tablet Take 1 tablet (20 mg total) by mouth daily. 11/08/20   Allegra Grana, FNP  Fluticasone-Salmeterol (ADVAIR DISKUS) 250-50 MCG/DOSE AEPB Inhale 1 puff into the lungs 2 (two) times daily. 01/27/21   McLean-Scocuzza, Pasty Spillers, MD  gabapentin (NEURONTIN) 100 MG capsule Take 1 capsule (100 mg total) by mouth in the morning and at bedtime. 12/31/20   Allegra Grana, FNP  hydrochlorothiazide (MICROZIDE) 12.5 MG capsule Take 1 capsule (12.5 mg total) by mouth daily. 02/09/21   Allegra Grana, FNP  HYDROcodone-acetaminophen (NORCO) 5-325 MG tablet Take 1 tablet by mouth 3 (three) times daily as needed.  01/27/21   McLean-Scocuzza, Pasty Spillers, MD  levothyroxine (SYNTHROID) 25 MCG tablet Take 1 tablet (25 mcg total) by mouth daily before breakfast. Take one tablet daily before breakfast on empty stomach. 02/09/21   Allegra Grana, FNP  lisinopril (ZESTRIL) 20 MG tablet Take 1 tablet (20 mg total) by mouth daily. 01/06/21   Allegra Grana, FNP  Multiple Vitamins-Minerals (CENTRUM SILVER ULTRA WOMENS) TABS Take 1 tablet by mouth daily.    [provider]  nitroGLYCERIN (NITROSTAT)  0.4 MG SL tablet Place 1 tablet (0.4 mg total) under the tongue every 5 (five) minutes as needed for chest pain. 09/29/19 09/28/20  End, Cristal Deer, MD  ondansetron (ZOFRAN-ODT) 4 MG disintegrating tablet Take 1 tablet (4 mg total) by mouth every 6 (six) hours as needed for nausea or vomiting. 01/06/21   Allegra Grana, FNP  potassium chloride (KLOR-CON) 10 MEQ tablet Take 1 tablet (10 mEq total) by mouth daily. 02/09/21   Allegra Grana, FNP  rosuvastatin (CRESTOR) 20 MG tablet Take 1 tablet (20 mg total) by mouth daily. 11/08/20   Allegra Grana, FNP    Review of Systems    ***.  All other systems reviewed and are otherwise negative except as noted above.  Physical Exam    VS:  There were no vitals taken for this visit. , BMI There is no height or weight on file to calculate BMI. GEN: Well nourished, well developed, in no acute distress. HEENT: normal. Neck: Supple, no JVD, carotid bruits, or masses. Cardiac: RRR, no murmurs, rubs, or gallops. No clubbing, cyanosis, edema.  Radials/DP/PT 2+ and equal bilaterally.  Respiratory:  Respirations regular and unlabored, clear to auscultation bilaterally. GI: Soft, nontender, nondistended, BS + x 4. MS: no deformity or atrophy. Skin: warm and dry, no rash. Neuro:  Strength and sensation are intact. Psych: Normal affect.  Accessory Clinical Findings    ECG personally reviewed by me today - *** - no acute changes.  Lab Results  Component Value Date   WBC 6.6 02/08/2021   HGB 12.6 02/08/2021   HCT 37.1 02/08/2021   MCV 93.1 02/08/2021   PLT 212.0 02/08/2021   Lab Results  Component Value Date   CREATININE 0.85 02/08/2021   CREATININE 0.85 02/08/2021   BUN 12 02/08/2021   BUN 12 02/08/2021   NA 142 02/08/2021   NA 142 02/08/2021   K 3.4 (L) 02/08/2021   K 3.4 (L) 02/08/2021   CL 101 02/08/2021   CL 101 02/08/2021   CO2 36 (H) 02/08/2021   CO2 36 (H) 02/08/2021   Lab Results  Component Value Date   ALT 13 02/08/2021    AST 20 02/08/2021   ALKPHOS 81 02/08/2021   BILITOT 0.4 02/08/2021   Lab Results  Component Value Date   CHOL 151 02/08/2021   HDL 45.60 02/08/2021   LDLCALC 91 06/18/2020   LDLDIRECT 69.0 02/08/2021   TRIG 300.0 (H) 02/08/2021   CHOLHDL 3 02/08/2021    Lab Results  Component Value Date   HGBA1C 5.0 02/08/2021    Assessment & Plan    1.  ***   Nicolasa Ducking, NP 02/10/2021, 7:32 AM

## 2021-02-11 ENCOUNTER — Telehealth: Payer: Self-pay | Admitting: Family

## 2021-02-11 NOTE — Telephone Encounter (Signed)
Left a voicemail to call back and schedule for repeat labs and office visit in 6 weeks.

## 2021-02-11 NOTE — Telephone Encounter (Signed)
Call pt  I dont see where she scheduled lab after our visit She needs lab 7 days from OV on 02/09/21.  I have ordered labs.  Please ensure she follow up with me as well aprpox 4-6 weeks, sooner if she would like

## 2021-02-11 NOTE — Assessment & Plan Note (Signed)
In setting of chronic fatigue which I suspect is multifactorial ( COPD, MS, lack of exercise) patient was most comfortable with trial of synthroid to see if conferred benefit. Close follow up.

## 2021-02-17 DIAGNOSIS — R2242 Localized swelling, mass and lump, left lower limb: Secondary | ICD-10-CM | POA: Diagnosis not present

## 2021-02-17 NOTE — Telephone Encounter (Signed)
LM on both numbers to call back to schedule labs 4-6 weeks & f/u with Claris Che.

## 2021-02-18 ENCOUNTER — Other Ambulatory Visit: Payer: Self-pay | Admitting: Family

## 2021-02-18 DIAGNOSIS — F411 Generalized anxiety disorder: Secondary | ICD-10-CM

## 2021-02-18 DIAGNOSIS — J449 Chronic obstructive pulmonary disease, unspecified: Secondary | ICD-10-CM | POA: Diagnosis not present

## 2021-02-18 DIAGNOSIS — R000 Tachycardia, unspecified: Secondary | ICD-10-CM

## 2021-02-18 NOTE — Telephone Encounter (Signed)
Pt did not want otr need to do labs in one week. Pt stated that she did not start medication due to ger erratic sleep patterns. She didn't want to take HCTZ due to having to get too much & urinate. She said that the swelling her legs had gone down, only her feet swell when she is on them long periods of time. She is scheduled for one month f/u.

## 2021-02-21 NOTE — Telephone Encounter (Signed)
LVM for patient to schedule f/u

## 2021-02-21 NOTE — Telephone Encounter (Signed)
Please schedule F/U appointment. Thank you! 

## 2021-02-23 NOTE — Telephone Encounter (Signed)
LVM for patient to schedule.

## 2021-02-25 ENCOUNTER — Other Ambulatory Visit: Payer: Self-pay | Admitting: Family

## 2021-02-25 NOTE — Telephone Encounter (Signed)
LVM for patient to schedule.

## 2021-03-04 NOTE — Telephone Encounter (Signed)
Scheduled on 3/21

## 2021-03-07 IMAGING — US US RENAL
1 series · 14 of 25 positions shown · non-contrast
Comparison: June 18, 2020.

CLINICAL DATA: Right renal cyst.

EXAM:
RENAL / URINARY TRACT ULTRASOUND COMPLETE

[Series 1: us renal · 0.23mm/px · 14 of 34 slices shown]
[im 1/34]
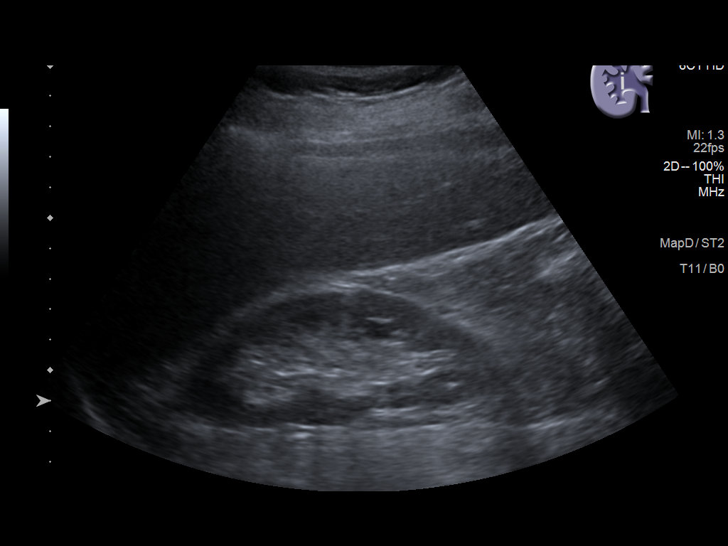
[im 3/34]
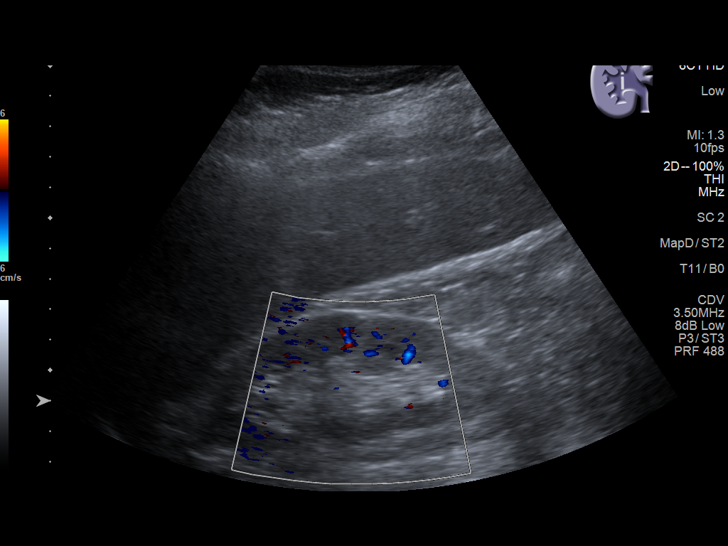
[im 6/34]
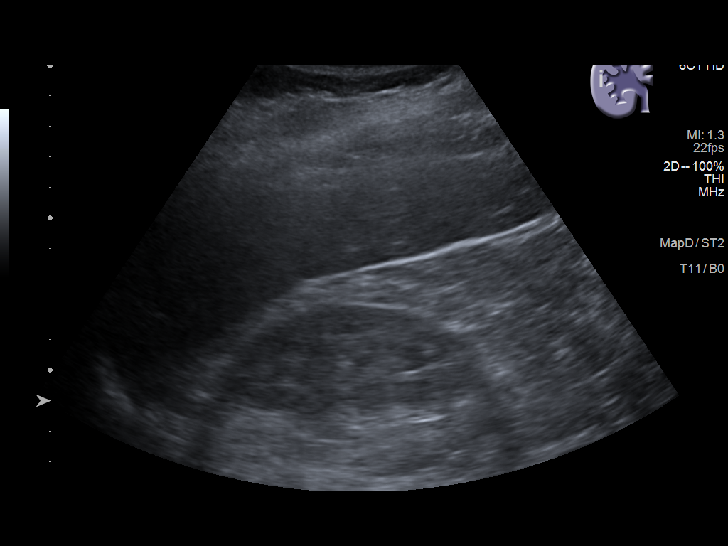
[im 9/34]
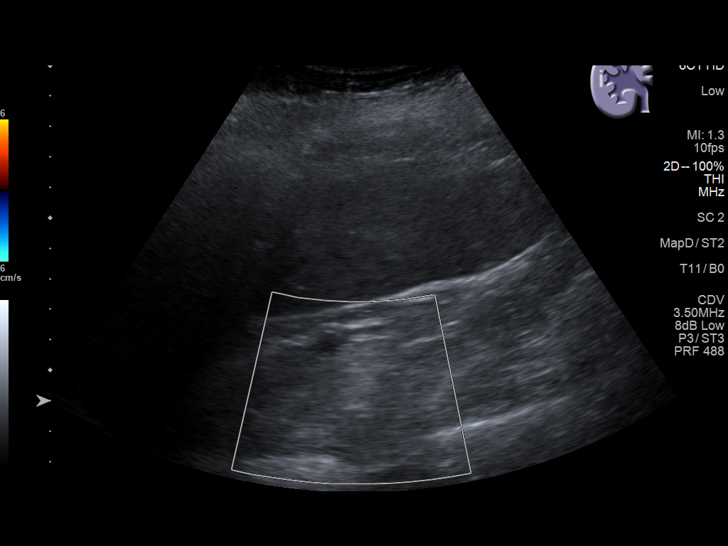
[im 12/34]
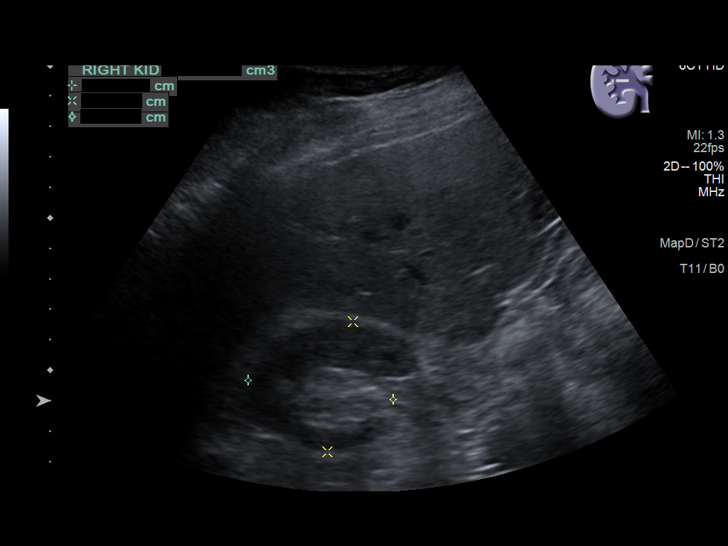
[im 13/34]
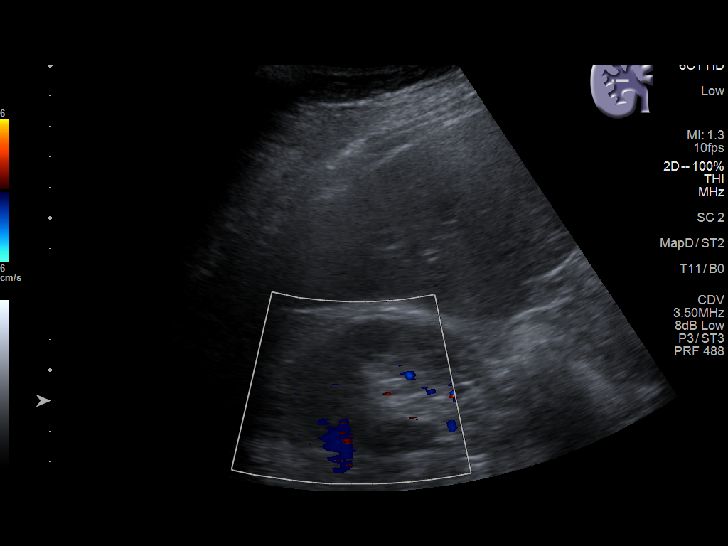
[im 16/34]
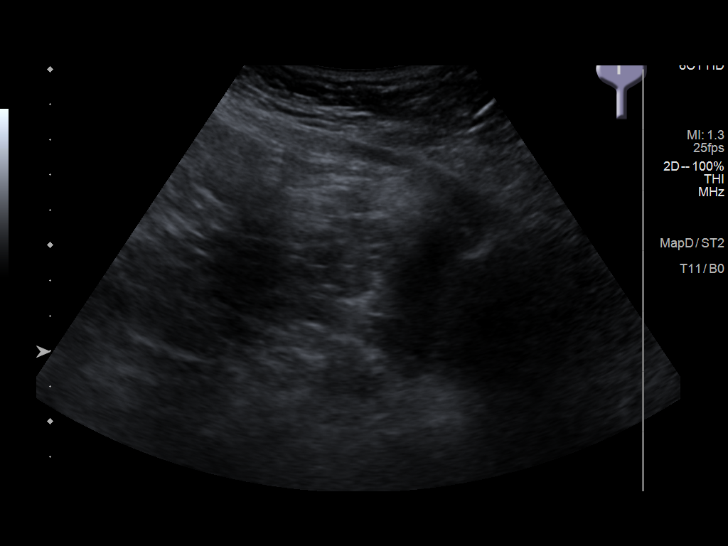
[im 18/34]
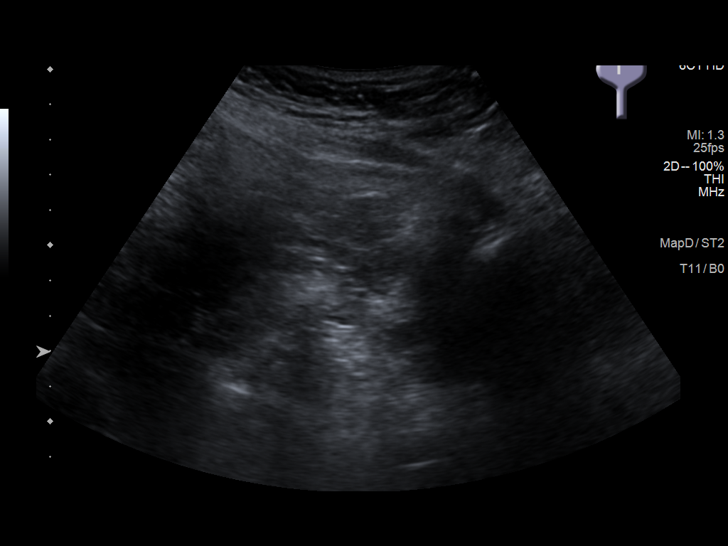
[im 21/34]
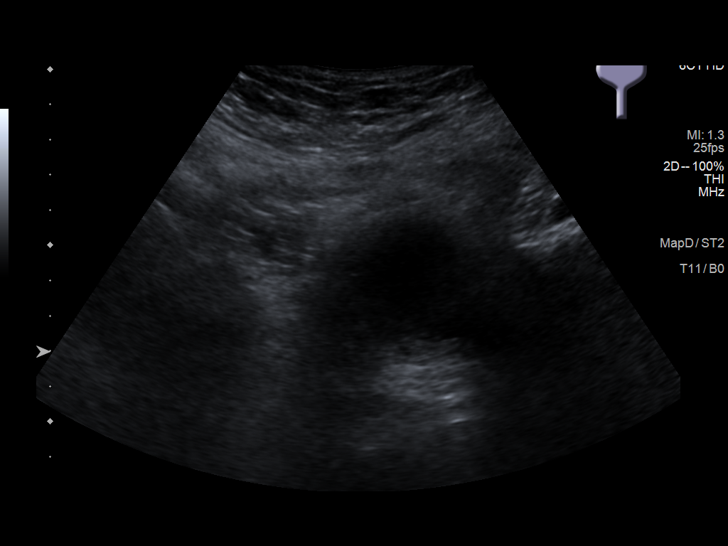
[im 23/34]
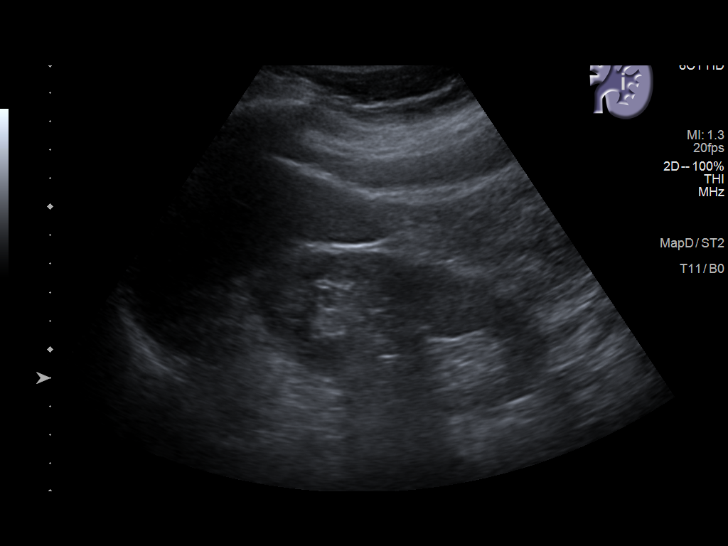
[im 25/34]
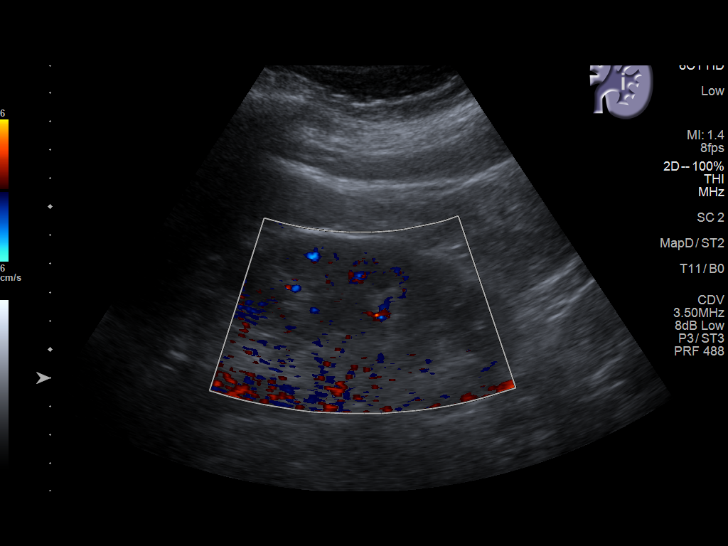
[im 28/34]
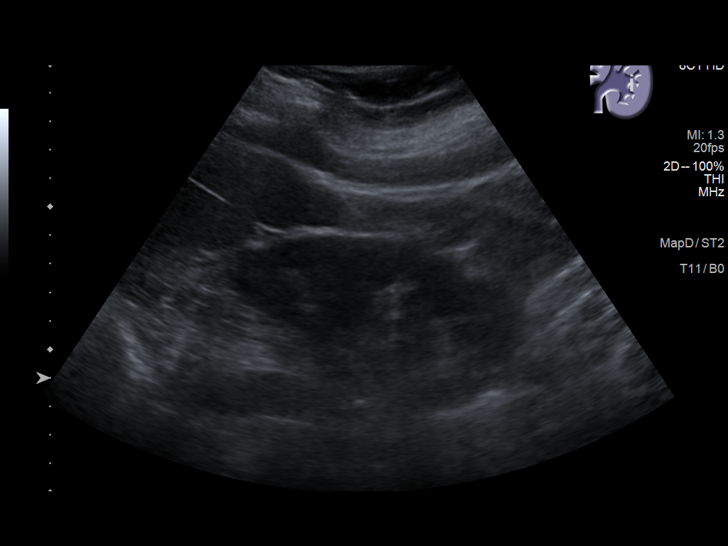
[im 31/34]
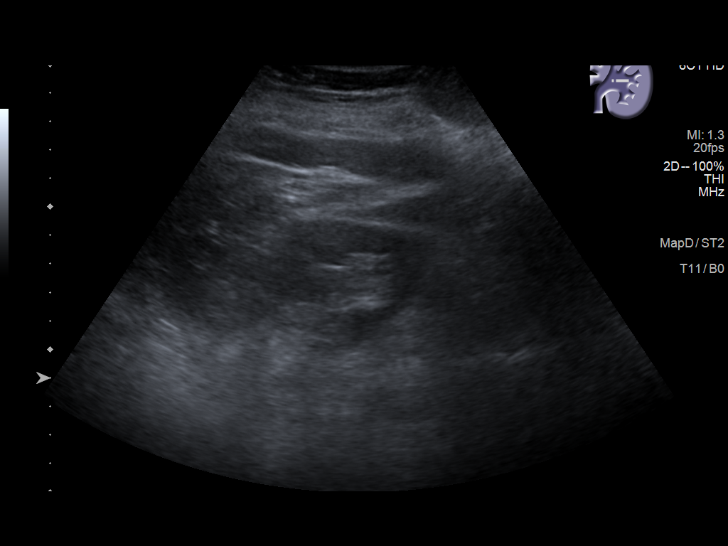
[im 34/34]
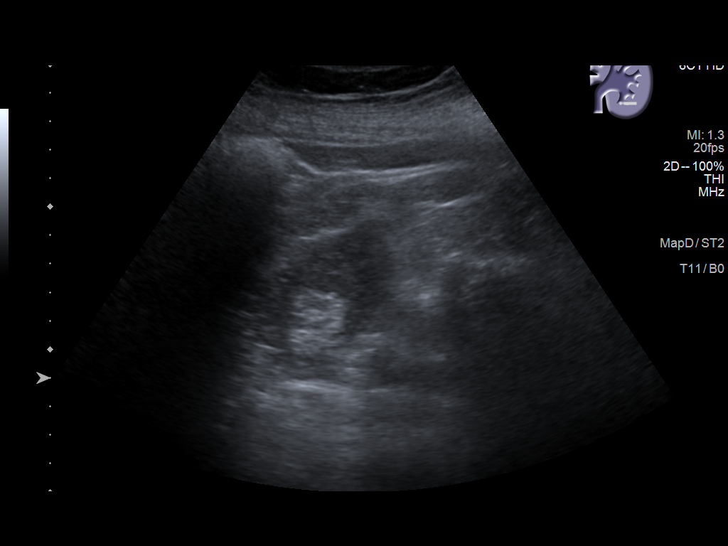

[14 of 25 positions shown; findings below may reference images not displayed]

FINDINGS: Right Kidney:

Renal measurements: 10.7 x 4.8 x 4.3 cm = volume: 117 mL. 1 cm
simple cyst is seen in lower pole. Echogenicity within normal
limits. No mass or hydronephrosis visualized.

Left Kidney:

Renal measurements: 11.1 x 5.3 x 4.1 cm = volume: 126 mL.
Echogenicity within normal limits. No mass or hydronephrosis
visualized.

Bladder:

Appears normal for degree of bladder distention.

Other:

None.
IMPRESSION: 1 cm simple right renal cyst is noted. No significant renal
abnormality is noted.

## 2021-03-14 ENCOUNTER — Ambulatory Visit: Payer: Medicare Other | Admitting: Physician Assistant

## 2021-03-18 DIAGNOSIS — J449 Chronic obstructive pulmonary disease, unspecified: Secondary | ICD-10-CM | POA: Diagnosis not present

## 2021-03-23 ENCOUNTER — Ambulatory Visit: Payer: Medicare Other | Admitting: Family

## 2021-03-25 ENCOUNTER — Other Ambulatory Visit: Payer: Self-pay

## 2021-03-25 ENCOUNTER — Encounter: Payer: Self-pay | Admitting: Anesthesiology

## 2021-03-25 ENCOUNTER — Ambulatory Visit: Payer: Medicare Other | Attending: Anesthesiology | Admitting: Anesthesiology

## 2021-03-25 DIAGNOSIS — G8929 Other chronic pain: Secondary | ICD-10-CM | POA: Diagnosis not present

## 2021-03-25 DIAGNOSIS — M47816 Spondylosis without myelopathy or radiculopathy, lumbar region: Secondary | ICD-10-CM | POA: Diagnosis not present

## 2021-03-25 DIAGNOSIS — M5030 Other cervical disc degeneration, unspecified cervical region: Secondary | ICD-10-CM | POA: Diagnosis not present

## 2021-03-25 DIAGNOSIS — M5441 Lumbago with sciatica, right side: Secondary | ICD-10-CM

## 2021-03-25 DIAGNOSIS — F1190 Opioid use, unspecified, uncomplicated: Secondary | ICD-10-CM

## 2021-03-25 DIAGNOSIS — M47812 Spondylosis without myelopathy or radiculopathy, cervical region: Secondary | ICD-10-CM | POA: Diagnosis not present

## 2021-03-25 DIAGNOSIS — G894 Chronic pain syndrome: Secondary | ICD-10-CM | POA: Diagnosis not present

## 2021-03-25 DIAGNOSIS — M5136 Other intervertebral disc degeneration, lumbar region: Secondary | ICD-10-CM | POA: Diagnosis not present

## 2021-03-25 DIAGNOSIS — M5442 Lumbago with sciatica, left side: Secondary | ICD-10-CM

## 2021-03-25 MED ORDER — HYDROCODONE-ACETAMINOPHEN 5-325 MG PO TABS: 1.0000 | ORAL_TABLET | Freq: Four times a day (QID) | ORAL | 0 refills | Status: AC | PRN

## 2021-03-25 MED ORDER — HYDROCODONE-ACETAMINOPHEN 5-325 MG PO TABS: 1.0000 | ORAL_TABLET | Freq: Four times a day (QID) | ORAL | 0 refills | Status: DC | PRN

## 2021-03-25 MED ORDER — CYCLOBENZAPRINE HCL 10 MG PO TABS: 10.0000 mg | ORAL_TABLET | Freq: Two times a day (BID) | ORAL | 2 refills | Status: AC | PRN

## 2021-03-25 NOTE — Progress Notes (Signed)
Virtual Visit via Telephone Note  I connected with Debra Hogan on 03/25/21 at  2:00 PM EDT by telephone and verified that I am speaking with the correct person using two identifiers.  Location: Patient: Home Provider: Pain control center   I discussed the limitations, risks, security and privacy concerns of performing an evaluation and management service by telephone and the availability of in person appointments. I also discussed with the patient that there Wolters be a patient responsible charge related to this service. The patient expressed understanding and agreed to proceed.   History of Present Illness: I spoke with Debra Hogan today via telephone as she was unable to do the video portion of virtual conference and she reports she is having a lot of spasming in her low back radiating down the legs.  Her last epidural was in October of last year she had excellent relief rating this at about 75 to 80% improvement lasting 3 to 4 months.  For financial reasons she is unable to proceed with a repeat epidural at this time.  Despite efforts at stretching strengthening and conservative care she continues to have severe pain.  She has done well with hydrocodone in the past and has not been taking this for the last few months and putting up with the pain.  Unfortunately more conservative therapy has been ineffective for her.  She did well with the medications and would like to reinitiate these if possible.  Otherwise no change in the quality characteristic or distribution of her pain is noted at this time.   Observations/Objective:  Current Outpatient Medications:  .  HYDROcodone-acetaminophen (NORCO/VICODIN) 5-325 MG tablet, Take 1 tablet by mouth every 6 (six) hours as needed for moderate pain or severe pain., Disp: 90 tablet, Rfl: 0 .  [START ON 04/24/2021] HYDROcodone-acetaminophen (NORCO/VICODIN) 5-325 MG tablet, Take 1 tablet by mouth every 6 (six) hours as needed for moderate pain or severe pain., Disp: 90  tablet, Rfl: 0 .  acyclovir (ZOVIRAX) 400 MG tablet, Take 1 tablet (400 mg total) by mouth daily., Disp: 90 tablet, Rfl: 3 .  Albuterol Sulfate (PROAIR RESPICLICK) 108 (90 Base) MCG/ACT AEPB, Inhale 1 puff into the lungs every 6 (six) hours as needed (as needed for cough, wheezing)., Disp: 1 each, Rfl: 0 .  ALPRAZolam (XANAX) 0.25 MG tablet, Take 1 tablet (0.25 mg total) by mouth 2 (two) times daily as needed for anxiety., Disp: 20 tablet, Rfl: 1 .  aspirin EC 81 MG tablet, Take 81 mg by mouth daily., Disp: , Rfl:  .  cholecalciferol (VITAMIN D) 1000 units tablet, Take 1,000 Units by mouth daily., Disp: , Rfl:  .  cyclobenzaprine (FLEXERIL) 10 MG tablet, Take 1 tablet (10 mg total) by mouth 2 (two) times daily as needed for muscle spasms., Disp: 60 tablet, Rfl: 2 .  diclofenac sodium (VOLTAREN) 1 % GEL, Apply 4 g topically 4 (four) times daily., Disp: 1 Tube, Rfl: 3 .  diltiazem (CARDIZEM CD) 180 MG 24 hr capsule, Take 1 capsule (180 mg total) by mouth daily. Please schedule office visit for further refills. Thank you!, Disp: 30 capsule, Rfl: 0 .  escitalopram (LEXAPRO) 20 MG tablet, Take 1 tablet by mouth once daily, Disp: 90 tablet, Rfl: 0 .  Fluticasone-Salmeterol (ADVAIR DISKUS) 250-50 MCG/DOSE AEPB, Inhale 1 puff into the lungs 2 (two) times daily., Disp: 1 each, Rfl: 3 .  gabapentin (NEURONTIN) 100 MG capsule, Take 1 capsule (100 mg total) by mouth in the morning and at bedtime., Disp:  60 capsule, Rfl: 4 .  hydrochlorothiazide (MICROZIDE) 12.5 MG capsule, Take 1 capsule (12.5 mg total) by mouth daily., Disp: 90 capsule, Rfl: 0 .  HYDROcodone-acetaminophen (NORCO) 5-325 MG tablet, Take 1 tablet by mouth 3 (three) times daily as needed., Disp: 10 tablet, Rfl: 0 .  levothyroxine (SYNTHROID) 25 MCG tablet, Take 1 tablet (25 mcg total) by mouth daily before breakfast. Take one tablet daily before breakfast on empty stomach., Disp: 90 tablet, Rfl: 3 .  lisinopril (ZESTRIL) 20 MG tablet, Take 1 tablet  (20 mg total) by mouth daily., Disp: 90 tablet, Rfl: 3 .  Multiple Vitamins-Minerals (CENTRUM SILVER ULTRA WOMENS) TABS, Take 1 tablet by mouth daily., Disp: , Rfl:  .  nitroGLYCERIN (NITROSTAT) 0.4 MG SL tablet, Place 1 tablet (0.4 mg total) under the tongue every 5 (five) minutes as needed for chest pain., Disp: 25 tablet, Rfl: 3 .  ondansetron (ZOFRAN-ODT) 4 MG disintegrating tablet, Take 1 tablet (4 mg total) by mouth every 6 (six) hours as needed for nausea or vomiting., Disp: 20 tablet, Rfl: 2 .  potassium chloride (KLOR-CON) 10 MEQ tablet, Take 1 tablet (10 mEq total) by mouth daily., Disp: 90 tablet, Rfl: 1 .  rosuvastatin (CRESTOR) 20 MG tablet, Take 1 tablet by mouth once daily, Disp: 90 tablet, Rfl: 0 Assessment and Plan: 1. Chronic, continuous use of opioids   2. DDD (degenerative disc disease), lumbar   3. Chronic bilateral low back pain with bilateral sciatica   4. DDD (degenerative disc disease), cervical   5. Facet arthropathy, cervical   6. Chronic pain syndrome   7. Lumbar facet joint syndrome   Based on our discussion today I think is appropriate to reinitiate her opioid therapy.  She did well with this with no side effects reported and got good relief.  Unfortunately more conservative therapy has been ineffective and for financial reasons she cannot proceed with a repeat epidural at this time.  I will restart her hydrocodone at 5 mg strength and do this for the next 2 months.  I have reviewed the [facility] practitioner database information and it is appropriate.  We have talked about the risks and benefits of chronic opioid therapy and she is aware of these.  She uses this sparingly she reports.  I have encouraged her to continue with stretching strengthening exercises and contact us when she desires to proceed with a repeat interventional therapy.  Continue follow-up with her primary care physicians for her baseline medical care.  Follow Up Instructions:    I discussed  the assessment and treatment plan with the patient. The patient was provided an opportunity to ask questions and all were answered. The patient agreed with the plan and demonstrated an understanding of the instructions.   The patient was advised to call back or seek an in-person evaluation if the symptoms worsen or if the condition fails to improve as anticipated.  I provided 30 minutes of non-face-to-face time during this encounter.   Yevette Edwards, MD

## 2021-04-07 ENCOUNTER — Encounter: Payer: Self-pay | Admitting: Family

## 2021-04-07 ENCOUNTER — Ambulatory Visit (INDEPENDENT_AMBULATORY_CARE_PROVIDER_SITE_OTHER): Payer: Medicare Other | Admitting: Family

## 2021-04-07 ENCOUNTER — Other Ambulatory Visit: Payer: Self-pay

## 2021-04-07 VITALS — BP 115/73 | HR 107 | Temp 98.4°F | Ht 65.0 in | Wt 169.6 lb

## 2021-04-07 DIAGNOSIS — I10 Essential (primary) hypertension: Secondary | ICD-10-CM | POA: Diagnosis not present

## 2021-04-07 DIAGNOSIS — F419 Anxiety disorder, unspecified: Secondary | ICD-10-CM | POA: Diagnosis not present

## 2021-04-07 DIAGNOSIS — E038 Other specified hypothyroidism: Secondary | ICD-10-CM | POA: Diagnosis not present

## 2021-04-07 DIAGNOSIS — I700 Atherosclerosis of aorta: Secondary | ICD-10-CM | POA: Diagnosis not present

## 2021-04-07 DIAGNOSIS — F32A Depression, unspecified: Secondary | ICD-10-CM

## 2021-04-07 DIAGNOSIS — J449 Chronic obstructive pulmonary disease, unspecified: Secondary | ICD-10-CM

## 2021-04-07 LAB — COMPREHENSIVE METABOLIC PANEL
ALT: 18 U/L (ref 0–35)
AST: 22 U/L (ref 0–37)
Albumin: 4.1 g/dL (ref 3.5–5.2)
Alkaline Phosphatase: 74 U/L (ref 39–117)
BUN: 21 mg/dL (ref 6–23)
CO2: 32 mEq/L (ref 19–32)
Calcium: 9.5 mg/dL (ref 8.4–10.5)
Chloride: 101 mEq/L (ref 96–112)
Creatinine, Ser: 1.15 mg/dL (ref 0.40–1.20)
GFR: 51.39 mL/min — ABNORMAL LOW (ref >60.00)
Glucose, Bld: 90 mg/dL (ref 70–99)
Potassium: 4.3 mEq/L (ref 3.5–5.1)
Sodium: 140 mEq/L (ref 135–145)
Total Bilirubin: 0.4 mg/dL (ref 0.2–1.2)
Total Protein: 6.9 g/dL (ref 6.0–8.3)

## 2021-04-07 LAB — T3, FREE: T3, Free: 3.3 pg/mL (ref 2.3–4.2)

## 2021-04-07 LAB — T4, FREE: Free T4: 0.53 ng/dL — ABNORMAL LOW (ref 0.60–1.60)

## 2021-04-07 LAB — TSH: TSH: 5.22 u[IU]/mL — ABNORMAL HIGH (ref 0.35–4.50)

## 2021-04-07 MED ORDER — SPIRIVA HANDIHALER 18 MCG IN CAPS: 18.0000 ug | ORAL_CAPSULE | Freq: Every day | RESPIRATORY_TRACT | 12 refills | Status: DC

## 2021-04-07 MED ORDER — ALPRAZOLAM 0.25 MG PO TABS: 0.2500 mg | ORAL_TABLET | Freq: Two times a day (BID) | ORAL | 1 refills | Status: DC | PRN

## 2021-04-07 NOTE — Progress Notes (Signed)
Subjective:    Patient ID: Debra Hogan, female    DOB: [birth year], 60 y.o.   MRN: [id number]  CC: Debra Comp Hogan is a 60 y.o. female who presents today for follow up.   HPI: Anxiety and depression- compliant with xanax 0.25mg  prn; she Mcjunkin not use xanax every couple of days. compliant lexapro 20mg . Feels regimen is adequate.  No si/hi  COPD- compliant with advair.She had seen Dr Nicholos Johns in 2019. He started spiriva in addition at advair at that time. Patient had not returned to follow up. She continues to wear 2L 02 during the day. She has noticed that SOB has worsened. She coughs daily. No increased sputum. No CP.   subclincial hypothyroidism- started 25 mcg synthroid at last visit. Fatigue has not improved.     HTN, sinus tachycardia, atypical CP, pulmonary HTN- follows with Jenelle Mages, cardiology. She is compliant with diltiazem 180mg  and would like for me to resume prescription. Last seen by cardiology 07/2020  HTN- compliant with lisinopril 20mg .   She is not taking hctz 12.5mg  qd, and potassium chloride 10 meq qd   Pain management- follows with dr Pernell Dupre whom prescribes norco  HLD- compliant with crestor 20mg .    HISTORY:  Past Medical History:  Diagnosis Date  . Anginal pain (HCC)   . Arthritis   . COPD (chronic obstructive pulmonary disease) (HCC)   . Depression   . Dyspnea   . Fibromyalgia   . Headache   . Heat stroke 2006  . Irritable bowel syndrome   . MS (multiple sclerosis) (HCC)    Followed by Dr. Sherryll Burger   Past Surgical History:  Procedure Laterality Date  . ABDOMINAL HYSTERECTOMY     CERVIX intact; had for noncancerous reasons, endometriosis  . CHOLECYSTECTOMY N/A 01/03/2016   Procedure: LAPAROSCOPIC CHOLECYSTECTOMY WITH INTRAOPERATIVE CHOLANGIOGRAM;  Surgeon: Lattie Haw, MD;  Location: [facility] ORS;  Service: General;  Laterality: N/A;  . ENDOSCOPIC RETROGRADE CHOLANGIOPANCREATOGRAPHY (ERCP) WITH PROPOFOL N/A 01/04/2016   Procedure: ENDOSCOPIC  RETROGRADE CHOLANGIOPANCREATOGRAPHY (ERCP) WITH PROPOFOL;  Surgeon: Midge Minium, MD;  Location: [facility] ENDOSCOPY;  Service: Endoscopy;  Laterality: N/A;  . FOOT SURGERY  2001  . LAPAROSCOPIC ENDOMETRIOSIS FULGURATION    . RIGHT/LEFT HEART CATH AND CORONARY ANGIOGRAPHY Bilateral 09/29/2019   Procedure: RIGHT/LEFT HEART CATH AND CORONARY ANGIOGRAPHY;  Surgeon: Yvonne Kendall, MD;  Location: [facility] INVASIVE CV LAB;  Service: Cardiovascular;  Laterality: Bilateral;   Family History  Problem Relation Age of Onset  . Hypertension Mother   . Hyperlipidemia Mother   . Arthritis Mother   . Cancer Father        throat  . Heart disease Brother   . Bipolar disorder Son   . Schizophrenia Son   . Cancer Maternal Grandfather        liver  . Cancer Paternal Grandmother        liver    Allergies: Valium [diazepam] Current Outpatient Medications on File Prior to Visit  Medication Sig Dispense Refill  . acyclovir (ZOVIRAX) 400 MG tablet Take 1 tablet (400 mg total) by mouth daily. 90 tablet 3  . Albuterol Sulfate (PROAIR RESPICLICK) 108 (90 Base) MCG/ACT AEPB Inhale 1 puff into the lungs every 6 (six) hours as needed (as needed for cough, wheezing). 1 each 0  . aspirin EC 81 MG tablet Take 81 mg by mouth daily.    . cholecalciferol (VITAMIN D) 1000 units tablet Take 1,000 Units by mouth daily.    . cyclobenzaprine (FLEXERIL) 10  MG tablet Take 1 tablet (10 mg total) by mouth 2 (two) times daily as needed for muscle spasms. 60 tablet 2  . diclofenac sodium (VOLTAREN) 1 % GEL Apply 4 g topically 4 (four) times daily. 1 Tube 3  . diltiazem (CARDIZEM CD) 180 MG 24 hr capsule Take 1 capsule (180 mg total) by mouth daily. Please schedule office visit for further refills. Thank you! 30 capsule 0  . escitalopram (LEXAPRO) 20 MG tablet Take 1 tablet by mouth once daily 90 tablet 0  . gabapentin (NEURONTIN) 100 MG capsule Take 1 capsule (100 mg total) by mouth in the morning and at bedtime. (Patient taking  differently: Take 100 mg by mouth in the morning and at bedtime. Take one capsule (100 mg) by mouth in the morning and two (200 mg) by mouth at bedtime.) 60 capsule 4  . hydrochlorothiazide (MICROZIDE) 12.5 MG capsule Take 1 capsule (12.5 mg total) by mouth daily. 90 capsule 0  . HYDROcodone-acetaminophen (NORCO/VICODIN) 5-325 MG tablet Take 1 tablet by mouth every 6 (six) hours as needed for moderate pain or severe pain. 90 tablet 0  . [START ON 04/24/2021] HYDROcodone-acetaminophen (NORCO/VICODIN) 5-325 MG tablet Take 1 tablet by mouth every 6 (six) hours as needed for moderate pain or severe pain. 90 tablet 0  . levothyroxine (SYNTHROID) 25 MCG tablet Take 1 tablet (25 mcg total) by mouth daily before breakfast. Take one tablet daily before breakfast on empty stomach. 90 tablet 3  . lisinopril (ZESTRIL) 20 MG tablet Take 1 tablet (20 mg total) by mouth daily. 90 tablet 3  . Multiple Vitamins-Minerals (CENTRUM SILVER ULTRA WOMENS) TABS Take 1 tablet by mouth daily.    . ondansetron (ZOFRAN-ODT) 4 MG disintegrating tablet Take 1 tablet (4 mg total) by mouth every 6 (six) hours as needed for nausea or vomiting. 20 tablet 2  . potassium chloride (KLOR-CON) 10 MEQ tablet Take 1 tablet (10 mEq total) by mouth daily. 90 tablet 1  . rosuvastatin (CRESTOR) 20 MG tablet Take 1 tablet by mouth once daily 90 tablet 0  . [DISCONTINUED] Fluticasone-Salmeterol (ADVAIR DISKUS) 250-50 MCG/DOSE AEPB Inhale 1 puff into the lungs 2 (two) times daily. 1 each 3  . HYDROcodone-acetaminophen (NORCO) 5-325 MG tablet Take 1 tablet by mouth 3 (three) times daily as needed. (Patient not taking: Reported on 04/07/2021) 10 tablet 0  . nitroGLYCERIN (NITROSTAT) 0.4 MG SL tablet Place 1 tablet (0.4 mg total) under the tongue every 5 (five) minutes as needed for chest pain. 25 tablet 3   No current facility-administered medications on file prior to visit.    Social History   Tobacco Use  . Smoking status: Current Every Day  Smoker    Packs/day: 1.00    Years: 43.00    Pack years: 43.00  . Smokeless tobacco: Never Used  Vaping Use  . Vaping Use: Never used  Substance Use Topics  . Alcohol use: No    Alcohol/week: 0.0 standard drinks  . Drug use: No    Review of Systems  Constitutional: Positive for fatigue (chronic). Negative for chills and fever.  Respiratory: Positive for cough and shortness of breath.   Cardiovascular: Negative for chest pain and palpitations.  Gastrointestinal: Negative for nausea and vomiting.  Psychiatric/Behavioral: Negative for suicidal ideas. The patient is not nervous/anxious (controlled).       Objective:    BP 115/73   Pulse (!) 107   Temp 98.4 F (36.9 C)   Ht 5\' 5"  (1.651 m)  Wt 169 lb 9.6 oz (76.9 kg)   SpO2 99%   BMI 28.22 kg/m  BP Readings from Last 3 Encounters:  04/07/21 115/73  02/09/21 126/88  01/27/21 138/78   Wt Readings from Last 3 Encounters:  04/07/21 169 lb 9.6 oz (76.9 kg)  02/09/21 172 lb (78 kg)  01/27/21 172 lb 6 oz (78.2 kg)    Physical Exam Vitals reviewed.  Constitutional:      Appearance: She is well-developed.  Eyes:     Conjunctiva/sclera: Conjunctivae normal.  Cardiovascular:     Rate and Rhythm: Normal rate and regular rhythm.     Pulses: Normal pulses.     Heart sounds: Normal heart sounds.  Pulmonary:     Effort: Pulmonary effort is normal.     Breath sounds: Normal breath sounds. No wheezing, rhonchi or rales.  Skin:    General: Skin is warm and dry.  Neurological:     Mental Status: She is alert.  Psychiatric:        Speech: Speech normal.        Behavior: Behavior normal.        Thought Content: Thought content normal.        Assessment & Plan:   Problem List Items Addressed This Visit      Cardiovascular and Mediastinum   Atherosclerosis of aorta (HCC)    Controlled. Continue crestor 20mg .       HTN (hypertension)    Controlled today. She is not taking hctz 12.5mg  qd, and potassium chloride 10 meq  qd as frequent  Urination is difficult as it relates to mobility, SOB . She is taking lisinopril 20mg  diltiazem 180mg . Advised to trial stop hctz, potassium chloride for now.         Relevant Orders   Comprehensive metabolic panel     Respiratory   COPD, moderate (HCC) - Primary    Worsening. Discussed progressive nature of COPD in setting of continued tobacco use. She has not followed up with pulmonology which I think is very important; patient politely declines returning today however she was open to this in the near future. Resume spiriva , continue advair. Close f/u in 6 weeks to discuss re-establishment with pulmonology.      Relevant Medications   tiotropium (SPIRIVA HANDIHALER) 18 MCG inhalation capsule     Endocrine   Subclinical hypothyroidism    No changes in fatigue. Pending thyroid studies. Plan to discontinue synthroid if no compelling benefit.       Relevant Orders   T3, free   T4, free   TSH     Other   Anxiety and depression    Controlled. Continue xanax 0.25mg  prn, lexapro 20mg  . I looked up patient on [place] Controlled Substances Reporting System PMP AWARE and saw no activity that raised concern of inappropriate use.        Relevant Medications   ALPRAZolam (XANAX) 0.25 MG tablet       I have discontinued Mackenzie D. Weide's ALPRAZolam and Fluticasone-Salmeterol. I am also having her start on Spiriva HandiHaler and ALPRAZolam. Additionally, I am having her maintain her Centrum Silver , cholecalciferol, diclofenac sodium, nitroGLYCERIN, aspirin EC, acyclovir, gabapentin, ondansetron, lisinopril, ProAir RespiClick, HYDROcodone-acetaminophen, hydrochlorothiazide, potassium chloride, levothyroxine, diltiazem, escitalopram, rosuvastatin, HYDROcodone-acetaminophen, HYDROcodone-acetaminophen, and cyclobenzaprine.   Meds ordered this encounter  Medications  . tiotropium (SPIRIVA HANDIHALER) 18 MCG inhalation capsule    Sig: Place 1 capsule (18 mcg total)  into inhaler and inhale daily.    Dispense:  30 capsule    Refill:  12    Order Specific Question:   Supervising Provider    Answer:   Duncan Dull L [[id number]]  . ALPRAZolam (XANAX) 0.25 MG tablet    Sig: Take 1 tablet (0.25 mg total) by mouth 2 (two) times daily as needed for anxiety.    Dispense:  20 tablet    Refill:  1    Order Specific Question:   Supervising Provider    Answer:   Sherlene Shams [[id number]]    Return precautions given.   Risks, benefits, and alternatives of the medications and treatment plan prescribed today were discussed, and patient expressed understanding.   Education regarding symptom management and diagnosis given to patient on AVS.  Continue to follow with Allegra Grana, FNP for routine health maintenance.   Mahogani D Busbee and I agreed with plan.   Rennie Plowman, FNP

## 2021-04-07 NOTE — Assessment & Plan Note (Signed)
Worsening. Discussed progressive nature of COPD in setting of continued tobacco use. She has not followed up with pulmonology which I think is very important; patient politely declines returning today however she was open to this in the near future. Resume spiriva , continue advair. Close f/u in 6 weeks to discuss re-establishment with pulmonology.

## 2021-04-07 NOTE — Assessment & Plan Note (Signed)
Controlled. Continue crestor 20mg °

## 2021-04-07 NOTE — Assessment & Plan Note (Signed)
No changes in fatigue. Pending thyroid studies. Plan to discontinue synthroid if no compelling benefit.

## 2021-04-07 NOTE — Patient Instructions (Signed)
Start spiriva Continue advair  Let me know how you are doing

## 2021-04-07 NOTE — Assessment & Plan Note (Signed)
Controlled today. She is not taking hctz 12.5mg  qd, and potassium chloride 10 meq qd as frequent  Urination is difficult as it relates to mobility, SOB . She is taking lisinopril 20mg  diltiazem 180mg . Advised to trial stop hctz, potassium chloride for now.

## 2021-04-07 NOTE — Assessment & Plan Note (Signed)
Controlled. Continue xanax 0.25mg  prn, lexapro 20mg  . I looked up patient on [place] Controlled Substances Reporting System PMP AWARE and saw no activity that raised concern of inappropriate use.

## 2021-04-11 ENCOUNTER — Ambulatory Visit: Payer: Medicare Other | Admitting: Family

## 2021-04-11 ENCOUNTER — Encounter: Payer: Self-pay | Admitting: Family

## 2021-04-13 ENCOUNTER — Other Ambulatory Visit: Payer: Self-pay | Admitting: Family

## 2021-04-13 DIAGNOSIS — E038 Other specified hypothyroidism: Secondary | ICD-10-CM

## 2021-04-13 MED ORDER — LEVOTHYROXINE SODIUM 50 MCG PO TABS: 50.0000 ug | ORAL_TABLET | Freq: Every day | ORAL | 1 refills | Status: DC

## 2021-04-14 ENCOUNTER — Telehealth: Payer: Self-pay

## 2021-04-14 NOTE — Telephone Encounter (Signed)
LM with patient's husband & he will have her call back for labs.

## 2021-04-14 NOTE — Telephone Encounter (Signed)
LMTCB

## 2021-04-14 NOTE — Telephone Encounter (Signed)
PT called back in regards to early phone call from Maralyn Sago

## 2021-04-18 DIAGNOSIS — J449 Chronic obstructive pulmonary disease, unspecified: Secondary | ICD-10-CM | POA: Diagnosis not present

## 2021-05-08 ENCOUNTER — Other Ambulatory Visit: Payer: Self-pay | Admitting: Family

## 2021-05-11 DIAGNOSIS — G894 Chronic pain syndrome: Secondary | ICD-10-CM | POA: Diagnosis not present

## 2021-05-17 LAB — TOXASSURE SELECT 13 (MW), URINE

## 2021-05-18 DIAGNOSIS — J449 Chronic obstructive pulmonary disease, unspecified: Secondary | ICD-10-CM | POA: Diagnosis not present

## 2021-05-19 ENCOUNTER — Other Ambulatory Visit: Payer: Self-pay

## 2021-05-19 ENCOUNTER — Ambulatory Visit: Payer: Medicare Other | Attending: Anesthesiology | Admitting: Anesthesiology

## 2021-05-19 ENCOUNTER — Telehealth: Payer: Self-pay | Admitting: Anesthesiology

## 2021-05-19 ENCOUNTER — Encounter: Payer: Self-pay | Admitting: Anesthesiology

## 2021-05-19 DIAGNOSIS — M5030 Other cervical disc degeneration, unspecified cervical region: Secondary | ICD-10-CM

## 2021-05-19 DIAGNOSIS — M5441 Lumbago with sciatica, right side: Secondary | ICD-10-CM

## 2021-05-19 DIAGNOSIS — M47812 Spondylosis without myelopathy or radiculopathy, cervical region: Secondary | ICD-10-CM

## 2021-05-19 DIAGNOSIS — M5442 Lumbago with sciatica, left side: Secondary | ICD-10-CM | POA: Diagnosis not present

## 2021-05-19 DIAGNOSIS — M5136 Other intervertebral disc degeneration, lumbar region: Secondary | ICD-10-CM | POA: Diagnosis not present

## 2021-05-19 DIAGNOSIS — G8929 Other chronic pain: Secondary | ICD-10-CM

## 2021-05-19 DIAGNOSIS — G894 Chronic pain syndrome: Secondary | ICD-10-CM

## 2021-05-19 DIAGNOSIS — F1190 Opioid use, unspecified, uncomplicated: Secondary | ICD-10-CM

## 2021-05-19 MED ORDER — HYDROCODONE-ACETAMINOPHEN 5-325 MG PO TABS: 1.0000 | ORAL_TABLET | Freq: Four times a day (QID) | ORAL | 0 refills | Status: DC | PRN

## 2021-05-19 MED ORDER — HYDROCODONE-ACETAMINOPHEN 5-325 MG PO TABS: 1.0000 | ORAL_TABLET | Freq: Four times a day (QID) | ORAL | 0 refills | Status: AC | PRN

## 2021-05-19 NOTE — Progress Notes (Signed)
Virtual Visit via Telephone Note  I connected with [name] on 05/19/21 at  9:50 AM EDT by telephone and verified that I am speaking with the correct person using two identifiers.  Location: Patient: Home Provider: [facility] control center   I discussed the limitations, risks, security and privacy concerns of performing an evaluation and management service by telephone and the availability of in person appointments. I also discussed with the patient that there Piche be a patient responsible charge related to this service. The patient expressed understanding and agreed to proceed.   History of Present Illness:   I spoke with Debra Hogan today via telephone as we were unable to link for the video portion of the virtual conference.  She reports that recently her mother has moved down and she has been doing a lot of heavy lifting and her back pain has been intolerable.  She has run out of medication and is overdue.  She has been using hydrocodone 5 mg strength 3 times a day generally getting approximately 25 to 30% relief with the medicine.  She is overdue for an epidural she reports however secondary to financial restriction she cannot pursue this at this time.  The quality characteristic and distribution of the pain is otherwise unchanged.  She is trying to do her stretching exercises and these are of limited value.  She takes her gabapentin as scheduled at the 100 mg dose.  She has trouble sleeping at night and primary pain during the early morning hours.  Otherwise no changes are noted in bowel or bladder function or strength. Observations/Objective:  Current Outpatient Medications:  .  [START ON 06/18/2021] HYDROcodone-acetaminophen (NORCO/VICODIN) 5-325 MG tablet, Take 1 tablet by mouth every 6 (six) hours as needed for moderate pain or severe pain., Disp: 120 tablet, Rfl: 0 .  acyclovir (ZOVIRAX) 400 MG tablet, Take 1 tablet (400 mg total) by mouth daily., Disp: 90 tablet, Rfl: 3 .  Albuterol Sulfate  (PROAIR RESPICLICK) 108 (90 Base) MCG/ACT AEPB, Inhale 1 puff into the lungs every 6 (six) hours as needed (as needed for cough, wheezing)., Disp: 1 each, Rfl: 0 .  ALPRAZolam (XANAX) 0.25 MG tablet, Take 1 tablet (0.25 mg total) by mouth 2 (two) times daily as needed for anxiety., Disp: 20 tablet, Rfl: 1 .  aspirin EC 81 MG tablet, Take 81 mg by mouth daily., Disp: , Rfl:  .  cholecalciferol (VITAMIN D) 1000 units tablet, Take 1,000 Units by mouth daily., Disp: , Rfl:  .  diclofenac sodium (VOLTAREN) 1 % GEL, Apply 4 g topically 4 (four) times daily., Disp: 1 Tube, Rfl: 3 .  diltiazem (CARDIZEM CD) 180 MG 24 hr capsule, Take 1 capsule (180 mg total) by mouth daily. Please schedule office visit for further refills. Thank you!, Disp: 30 capsule, Rfl: 0 .  escitalopram (LEXAPRO) 20 MG tablet, Take 1 tablet by mouth once daily, Disp: 90 tablet, Rfl: 0 .  gabapentin (NEURONTIN) 100 MG capsule, TAKE 1 CAPSULE BY MOUTH IN THE MORNING AND AT BEDTIME, Disp: 60 capsule, Rfl: 0 .  hydrochlorothiazide (MICROZIDE) 12.5 MG capsule, Take 1 capsule (12.5 mg total) by mouth daily., Disp: 90 capsule, Rfl: 0 .  HYDROcodone-acetaminophen (NORCO/VICODIN) 5-325 MG tablet, Take 1 tablet by mouth every 6 (six) hours as needed for moderate pain or severe pain., Disp: 120 tablet, Rfl: 0 .  levothyroxine (SYNTHROID) 50 MCG tablet, Take 1 tablet (50 mcg total) by mouth daily before breakfast. Take one tablet daily before breakfast on  empty stomach., Disp: 90 tablet, Rfl: 1 .  lisinopril (ZESTRIL) 20 MG tablet, Take 1 tablet (20 mg total) by mouth daily., Disp: 90 tablet, Rfl: 3 .  Multiple Vitamins-Minerals (CENTRUM SILVER ULTRA WOMENS) TABS, Take 1 tablet by mouth daily., Disp: , Rfl:  .  nitroGLYCERIN (NITROSTAT) 0.4 MG SL tablet, Place 1 tablet (0.4 mg total) under the tongue every 5 (five) minutes as needed for chest pain., Disp: 25 tablet, Rfl: 3 .  ondansetron (ZOFRAN-ODT) 4 MG disintegrating tablet, Take 1 tablet (4 mg  total) by mouth every 6 (six) hours as needed for nausea or vomiting., Disp: 20 tablet, Rfl: 2 .  potassium chloride (KLOR-CON) 10 MEQ tablet, Take 1 tablet (10 mEq total) by mouth daily., Disp: 90 tablet, Rfl: 1 .  rosuvastatin (CRESTOR) 20 MG tablet, Take 1 tablet by mouth once daily, Disp: 90 tablet, Rfl: 0 .  tiotropium (SPIRIVA HANDIHALER) 18 MCG inhalation capsule, Place 1 capsule (18 mcg total) into inhaler and inhale daily., Disp: 30 capsule, Rfl: 12 Review of Systems  Constitutional: Negative for chills and fever.  Cardiovascular: Negative for chest pain and palpitations.  Gastrointestinal: Negative for constipation and diarrhea.  Genitourinary: Negative for dysuria.  Neurological: Negative for dizziness, sensory change and weakness.   Assessment and Plan: 1. DDD (degenerative disc disease), lumbar   2. DDD (degenerative disc disease), cervical   3. Chronic bilateral low back pain with bilateral sciatica   4. Facet arthropathy, cervical   5. Chronic, continuous use of opioids   6. Chronic pain syndrome   I have reviewed the [facility] practitioner database information and it is appropriate for refill today.  I am going to increase her dosing to 4 times daily on the 5 mg strength.  She can use 2 tablets in the morning if the pain is most severe at that time.  I have also encouraged her to try Naprosyn 2 of the over-the-counter 220 mg strength tablets on a daily basis if needed in addition to her current dosing.  When she can I think it would be reasonable to proceed with a repeat epidural.  She is to continue her stretching strengthening exercises as tolerated with return to clinic in 2 months.  No other changes will be initiated in her pain management protocol at this time.  She is to continue follow-up with her primary care physicians as well for her baseline medical care.  Follow Up Instructions:    I discussed the assessment and treatment plan with the patient. The patient was  provided an opportunity to ask questions and all were answered. The patient agreed with the plan and demonstrated an understanding of the instructions.   The patient was advised to call back or seek an in-person evaluation if the symptoms worsen or if the condition fails to improve as anticipated.  I provided 30 minutes of non-face-to-face time during this encounter.   Yevette Edwards, MD

## 2021-05-20 ENCOUNTER — Telehealth: Payer: Self-pay

## 2021-05-20 MED ORDER — FLUTICASONE-SALMETEROL 250-50 MCG/ACT IN AEPB: 1.0000 | INHALATION_SPRAY | Freq: Two times a day (BID) | RESPIRATORY_TRACT | 1 refills | Status: DC

## 2021-05-20 NOTE — Telephone Encounter (Signed)
Sent to pharmacy 

## 2021-05-20 NOTE — Telephone Encounter (Signed)
Pt notified that script was sent

## 2021-05-20 NOTE — Telephone Encounter (Signed)
Patient called and asked could we please send in  Fluticasone-Salmeterol (ADVAIR DISKUS) 250-50 MCG/DOSE AEPB [[id number]] DISCONTINUED   Dose: 1 puff Route: Inhalation Frequency: 2 times daily  Dispense Quantity: 1 each Refills: 3       Sig: Inhale 1 puff into the lungs 2 (two) times daily.   It was dc'd by Claris Che last visit & Spiriva was sent, but patient cannot afford. She is completely now out of either medication & asked if this could please be sent in for her before the weekend.

## 2021-05-20 NOTE — Addendum Note (Signed)
Addended by: Birdie Sons, Hanne Kegg G on: 05/20/2021 12:05 PM   Modules accepted: Orders

## 2021-05-24 ENCOUNTER — Ambulatory Visit (INDEPENDENT_AMBULATORY_CARE_PROVIDER_SITE_OTHER): Payer: Medicare Other

## 2021-05-24 VITALS — Ht 65.0 in | Wt 169.0 lb

## 2021-05-24 DIAGNOSIS — Z0000 Encounter for general adult medical examination without abnormal findings: Secondary | ICD-10-CM

## 2021-05-24 NOTE — Patient Instructions (Addendum)
Debra Hogan , Thank you for taking time to come for your Medicare Wellness Visit. I appreciate your ongoing commitment to your health goals. Please review the following plan we discussed and let me know if I can assist you in the future.   These are the goals we discussed: Goals      Patient Stated   .  Maintain Healthy Lifestyle (pt-stated)      Stay hydrated  Stay active       This is a list of the screening recommended for you and due dates:  Health Maintenance  Topic Date Due  . Pap Smear  04/19/2021  . Zoster (Shingles) Vaccine (1 of 2) 08/24/2021*  . Colon Cancer Screening  09/24/2021  . Tetanus Vaccine  05/13/2028  . Hepatitis C Screening: USPSTF Recommendation to screen - Ages 33-79 yo.  Completed  . HIV Screening  Completed  . HPV Vaccine  Aged Out  . Flu Shot  Discontinued  . Mammogram  Discontinued  . COVID-19 Vaccine  Discontinued  *Topic was postponed. The date shown is not the original due date.   Advanced directives: not yet completed  Conditions/risks identified: none new  Follow up in one year for your annual wellness visit.   Preventive Care 40-64 Years, Female Preventive care refers to lifestyle choices and visits with your health care provider that can promote health and wellness. What does preventive care include?  A yearly physical exam. This is also called an annual well check.  Dental exams once or twice a year.  Routine eye exams. Ask your health care provider how often you should have your eyes checked.  Personal lifestyle choices, including:  Daily care of your teeth and gums.  Regular physical activity.  Eating a healthy diet.  Avoiding tobacco and drug use.  Limiting alcohol use.  Practicing safe sex.  Taking low-dose aspirin daily starting at age 50.  Taking vitamin and mineral supplements as recommended by your health care provider. What happens during an annual well check? The services and screenings done by your health care  provider during your annual well check will depend on your age, overall health, lifestyle risk factors, and family history of disease. Counseling  Your health care provider Ambs ask you questions about your:  Alcohol use.  Tobacco use.  Drug use.  Emotional well-being.  Home and relationship well-being.  Sexual activity.  Eating habits.  Work and work Statistician.  Method of birth control.  Menstrual cycle.  Pregnancy history. Screening  You Whiteaker have the following tests or measurements:  Height, weight, and BMI.  Blood pressure.  Lipid and cholesterol levels. These Vansickle be checked every 5 years, or more frequently if you are over 63 years old.  Skin check.  Lung cancer screening. You Demedeiros have this screening every year starting at age 10 if you have a 30-pack-year history of smoking and currently smoke or have quit within the past 15 years.  Fecal occult blood test (FOBT) of the stool. You Belflower have this test every year starting at age 10.  Flexible sigmoidoscopy or colonoscopy. You Mecham have a sigmoidoscopy every 5 years or a colonoscopy every 10 years starting at age 23.  Hepatitis C blood test.  Hepatitis B blood test.  Sexually transmitted disease (STD) testing.  Diabetes screening. This is done by checking your blood sugar (glucose) after you have not eaten for a while (fasting). You Simao have this done every 1-3 years.  Mammogram. This Parady be done every  1-2 years. Talk to your health care provider about when you should start having regular mammograms. This Ow depend on whether you have a family history of breast cancer.  BRCA-related cancer screening. This Mottola be done if you have a family history of breast, ovarian, tubal, or peritoneal cancers.  Pelvic exam and Pap test. This Locastro be done every 3 years starting at age 65. Starting at age 23, this Adamek be done every 5 years if you have a Pap test in combination with an HPV test.  Bone density scan. This is done  to screen for osteoporosis. You Poyser have this scan if you are at high risk for osteoporosis. Discuss your test results, treatment options, and if necessary, the need for more tests with your health care provider. Vaccines  Your health care provider Hollinsworth recommend certain vaccines, such as:  Influenza vaccine. This is recommended every year.  Tetanus, diphtheria, and acellular pertussis (Tdap, Td) vaccine. You Lucks need a Td booster every 10 years.  Zoster vaccine. You Moger need this after age 69.  Pneumococcal 13-valent conjugate (PCV13) vaccine. You Gaster need this if you have certain conditions and were not previously vaccinated.  Pneumococcal polysaccharide (PPSV23) vaccine. You Solarz need one or two doses if you smoke cigarettes or if you have certain conditions. Talk to your health care provider about which screenings and vaccines you need and how often you need them. This information is not intended to replace advice given to you by your health care provider. Make sure you discuss any questions you have with your health care provider. Document Released: 01/07/2016 Document Revised: 08/30/2016 Document Reviewed: 10/12/2015 Elsevier Interactive Patient Education  2017 [employer] Prevention in the Home Falls can cause injuries. They can happen to people of all ages. There are many things you can do to make your home safe and to help prevent falls. What can I do on the outside of my home?  Regularly fix the edges of walkways and driveways and fix any cracks.  Remove anything that might make you trip as you walk through a door, such as a raised step or threshold.  Trim any bushes or trees on the path to your home.  Use bright outdoor lighting.  Clear any walking paths of anything that might make someone trip, such as rocks or tools.  Regularly check to see if handrails are loose or broken. Make sure that both sides of any steps have handrails.  Any raised decks and porches  should have guardrails on the edges.  Have any leaves, snow, or ice cleared regularly.  Use sand or salt on walking paths during winter.  Clean up any spills in your garage right away. This includes oil or grease spills. What can I do in the bathroom?  Use night lights.  Install grab bars by the toilet and in the tub and shower. Do not use towel bars as grab bars.  Use non-skid mats or decals in the tub or shower.  If you need to sit down in the shower, use a plastic, non-slip stool.  Keep the floor dry. Clean up any water that spills on the floor as soon as it happens.  Remove soap buildup in the tub or shower regularly.  Attach bath mats securely with double-sided non-slip rug tape.  Do not have throw rugs and other things on the floor that can make you trip. What can I do in the bedroom?  Use night lights.  Make  sure that you have a light by your bed that is easy to reach.  Do not use any sheets or blankets that are too big for your bed. They should not hang down onto the floor.  Have a firm chair that has side arms. You can use this for support while you get dressed.  Do not have throw rugs and other things on the floor that can make you trip. What can I do in the kitchen?  Clean up any spills right away.  Avoid walking on wet floors.  Keep items that you use a lot in easy-to-reach places.  If you need to reach something above you, use a strong step stool that has a grab bar.  Keep electrical cords out of the way.  Do not use floor polish or wax that makes floors slippery. If you must use wax, use non-skid floor wax.  Do not have throw rugs and other things on the floor that can make you trip. What can I do with my stairs?  Do not leave any items on the stairs.  Make sure that there are handrails on both sides of the stairs and use them. Fix handrails that are broken or loose. Make sure that handrails are as long as the stairways.  Check any carpeting to  make sure that it is firmly attached to the stairs. Fix any carpet that is loose or worn.  Avoid having throw rugs at the top or bottom of the stairs. If you do have throw rugs, attach them to the floor with carpet tape.  Make sure that you have a light switch at the top of the stairs and the bottom of the stairs. If you do not have them, ask someone to add them for you. What else can I do to help prevent falls?  Wear shoes that:  Do not have high heels.  Have rubber bottoms.  Are comfortable and fit you well.  Are closed at the toe. Do not wear sandals.  If you use a stepladder:  Make sure that it is fully opened. Do not climb a closed stepladder.  Make sure that both sides of the stepladder are locked into place.  Ask someone to hold it for you, if possible.  Clearly mark and make sure that you can see:  Any grab bars or handrails.  First and last steps.  Where the edge of each step is.  Use tools that help you move around (mobility aids) if they are needed. These include:  Canes.  Walkers.  Scooters.  Crutches.  Turn on the lights when you go into a dark area. Replace any light bulbs as soon as they burn out.  Set up your furniture so you have a clear path. Avoid moving your furniture around.  If any of your floors are uneven, fix them.  If there are any pets around you, be aware of where they are.  Review your medicines with your doctor. Some medicines can make you feel dizzy. This can increase your chance of falling. Ask your doctor what other things that you can do to help prevent falls. This information is not intended to replace advice given to you by your health care provider. Make sure you discuss any questions you have with your health care provider. Document Released: 10/07/2009 Document Revised: 05/18/2016 Document Reviewed: 01/15/2015 Elsevier Interactive Patient Education  2017 [employer].

## 2021-05-24 NOTE — Progress Notes (Signed)
Subjective:   Debra Hogan is a 60 y.o. female who presents for Medicare Annual (Subsequent) preventive examination.  Review of Systems    No ROS.  Medicare Wellness Virtual Visit.  Visual/audio telehealth visit, UTA vital signs.   See social history for additional risk factors.   Cardiac Risk Factors include: advanced age (>20men, >103 women)     Objective:    Today's Vitals   05/24/21 1449  Weight: 169 lb (76.7 kg)  Height: 5\' 5"  (1.651 m)   Body mass index is 28.12 kg/m.  Advanced Directives 05/24/2021 01/16/2021 04/27/2020 09/29/2019 05/21/2019 03/25/2018 01/04/2016  Does Patient Have a Medical Advance Directive? No No No No No No No  Does patient want to make changes to medical advance directive? - - - - - Yes (MAU/Ambulatory/Procedural Areas - Information given) -  Would patient like information on creating a medical advance directive? No - Patient declined No - Patient declined - - Yes (MAU/Ambulatory/Procedural Areas - Information given) - No - patient declined information    Current Medications (verified) Outpatient Encounter Medications as of 05/24/2021  Medication Sig  . acyclovir (ZOVIRAX) 400 MG tablet Take 1 tablet (400 mg total) by mouth daily.  . Albuterol Sulfate (PROAIR RESPICLICK) 108 (90 Base) MCG/ACT AEPB Inhale 1 puff into the lungs every 6 (six) hours as needed (as needed for cough, wheezing).  . ALPRAZolam (XANAX) 0.25 MG tablet Take 1 tablet (0.25 mg total) by mouth 2 (two) times daily as needed for anxiety.  05/26/2021 aspirin EC 81 MG tablet Take 81 mg by mouth daily.  . cholecalciferol (VITAMIN D) 1000 units tablet Take 1,000 Units by mouth daily.  . diclofenac sodium (VOLTAREN) 1 % GEL Apply 4 g topically 4 (four) times daily.  Marland Kitchen diltiazem (CARDIZEM CD) 180 MG 24 hr capsule Take 1 capsule (180 mg total) by mouth daily. Please schedule office visit for further refills. Thank you!  Marland Kitchen escitalopram (LEXAPRO) 20 MG tablet Take 1 tablet by mouth once daily  .  fluticasone-salmeterol (ADVAIR DISKUS) 250-50 MCG/ACT AEPB Inhale 1 puff into the lungs in the morning and at bedtime.  . gabapentin (NEURONTIN) 100 MG capsule TAKE 1 CAPSULE BY MOUTH IN THE MORNING AND AT BEDTIME  . hydrochlorothiazide (MICROZIDE) 12.5 MG capsule Take 1 capsule (12.5 mg total) by mouth daily.  Marland Kitchen HYDROcodone-acetaminophen (NORCO/VICODIN) 5-325 MG tablet Take 1 tablet by mouth every 6 (six) hours as needed for moderate pain or severe pain.  Marland Kitchen ON 06/18/2021] HYDROcodone-acetaminophen (NORCO/VICODIN) 5-325 MG tablet Take 1 tablet by mouth every 6 (six) hours as needed for moderate pain or severe pain.  06/20/2021 levothyroxine (SYNTHROID) 50 MCG tablet Take 1 tablet (50 mcg total) by mouth daily before breakfast. Take one tablet daily before breakfast on empty stomach.  Marland Kitchen lisinopril (ZESTRIL) 20 MG tablet Take 1 tablet (20 mg total) by mouth daily.  . Multiple Vitamins-Minerals (CENTRUM SILVER ULTRA WOMENS) TABS Take 1 tablet by mouth daily.  . nitroGLYCERIN (NITROSTAT) 0.4 MG SL tablet Place 1 tablet (0.4 mg total) under the tongue every 5 (five) minutes as needed for chest pain.  Marland Kitchen ondansetron (ZOFRAN-ODT) 4 MG disintegrating tablet Take 1 tablet (4 mg total) by mouth every 6 (six) hours as needed for nausea or vomiting.  . potassium chloride (KLOR-CON) 10 MEQ tablet Take 1 tablet (10 mEq total) by mouth daily.  . rosuvastatin (CRESTOR) 20 MG tablet Take 1 tablet by mouth once daily  . tiotropium (SPIRIVA HANDIHALER) 18 MCG inhalation capsule Place  1 capsule (18 mcg total) into inhaler and inhale daily.   No facility-administered encounter medications on file as of 05/24/2021.    Allergies (verified) Valium [diazepam]   History: Past Medical History:  Diagnosis Date  . Anginal pain (HCC)   . Arthritis   . COPD (chronic obstructive pulmonary disease) (HCC)   . Depression   . Dyspnea   . Fibromyalgia   . Headache   . Heat stroke 2006  . Irritable bowel syndrome   . MS  (multiple sclerosis) (HCC)    Followed by Dr. Sherryll Burger   Past Surgical History:  Procedure Laterality Date  . ABDOMINAL HYSTERECTOMY     CERVIX intact; had for noncancerous reasons, endometriosis  . CHOLECYSTECTOMY N/A 01/03/2016   Procedure: LAPAROSCOPIC CHOLECYSTECTOMY WITH INTRAOPERATIVE CHOLANGIOGRAM;  Surgeon: Lattie Haw, MD;  Location: [facility] ORS;  Service: General;  Laterality: N/A;  . ENDOSCOPIC RETROGRADE CHOLANGIOPANCREATOGRAPHY (ERCP) WITH PROPOFOL N/A 01/04/2016   Procedure: ENDOSCOPIC RETROGRADE CHOLANGIOPANCREATOGRAPHY (ERCP) WITH PROPOFOL;  Surgeon: Midge Minium, MD;  Location: [facility] ENDOSCOPY;  Service: Endoscopy;  Laterality: N/A;  . FOOT SURGERY  2001  . LAPAROSCOPIC ENDOMETRIOSIS FULGURATION    . RIGHT/LEFT HEART CATH AND CORONARY ANGIOGRAPHY Bilateral 09/29/2019   Procedure: RIGHT/LEFT HEART CATH AND CORONARY ANGIOGRAPHY;  Surgeon: Yvonne Kendall, MD;  Location: [facility] INVASIVE CV LAB;  Service: Cardiovascular;  Laterality: Bilateral;   Family History  Problem Relation Age of Onset  . Hypertension Mother   . Hyperlipidemia Mother   . Arthritis Mother   . Cancer Father        throat  . Heart disease Brother   . Bipolar disorder Son   . Schizophrenia Son   . Cancer Maternal Grandfather        liver  . Cancer Paternal Grandmother        liver   Social History   Socioeconomic History  . Marital status: Married    Spouse name: Not on file  . Number of children: Not on file  . Years of education: Not on file  . Highest education level: Not on file  Occupational History  . Not on file  Tobacco Use  . Smoking status: Current Every Day Smoker    Packs/day: 1.00    Years: 43.00    Pack years: 43.00  . Smokeless tobacco: Never Used  Vaping Use  . Vaping Use: Never used  Substance and Sexual Activity  . Alcohol use: No    Alcohol/week: 0.0 standard drinks  . Drug use: No  . Sexual activity: Not Currently    Birth control/protection: Post-menopausal  Other  Topics Concern  . Not on file  Social History Narrative   Lives in [place] with son. Widowed x 2.      Married for 10 years.       On disability      Social Determinants of Health   Financial Resource Strain: Low Risk   . Difficulty of Paying Living Expenses: Not very hard  Food Insecurity: No Food Insecurity  . Worried About Programme researcher, broadcasting/film/video in the Last Year: Never true  . Ran Out of Food in the Last Year: Never true  Transportation Needs: No Transportation Needs  . Lack of Transportation (Medical): No  . Lack of Transportation (Non-Medical): No  Physical Activity: Insufficiently Active  . Days of Exercise per Week: 3 days  . Minutes of Exercise per Session: 10 min  Stress: No Stress Concern Present  . Feeling of Stress : Not at all  Social Connections: Unknown  . Frequency of Communication with Friends and Family: Not on file  . Frequency of Social Gatherings with Friends and Family: Not on file  . Attends Religious Services: Not on file  . Active Member of Clubs or Organizations: Not on file  . Attends Banker Meetings: Not on file  . Marital Status: Married    Tobacco Counseling Ready to quit: Not Answered Counseling given: Not Answered   Clinical Intake:  Pre-visit preparation completed: Yes        Diabetes: No  How often do you need to have someone help you when you read instructions, pamphlets, or other written materials from your doctor or pharmacy?: 1 - Never    Interpreter Needed?: No      Activities of Daily Living In your present state of health, do you have any difficulty performing the following activities: 05/24/2021  Hearing? N  Vision? N  Difficulty concentrating or making decisions? N  Walking or climbing stairs? Y  Dressing or bathing? N  Doing errands, shopping? Y  Preparing Food and eating ? N  Using the Toilet? N  In the past six months, have you accidently leaked urine? N  Do you have problems with loss of  bowel control? N  Managing your Medications? N  Managing your Finances? Y  Comment Husband assist as needed  Housekeeping or managing your Housekeeping? Y  Comment Husband assist as needed  Some recent data might be hidden    Patient Care Team: Allegra Grana, FNP as PCP - General (Family Medicine) Iran Ouch, MD as PCP - Cardiology (Cardiology)  Indicate any recent Medical Services you Wiland have received from other than [facility] providers in the past year (date Colan be approximate).     Assessment:   This is a routine wellness examination for Kiela.  I connected with [name] today by telephone and verified that I am speaking with the correct person using two identifiers. Location patient: home Location provider: work Persons participating in the virtual visit: patient, Engineer, civil (consulting).    I discussed the limitations, risks, security and privacy concerns of performing an evaluation and management service by telephone and the availability of in person appointments. The patient expressed understanding and verbally consented to this telephonic visit.    Interactive audio and video telecommunications were attempted between this provider and patient, however failed, due to patient having technical difficulties OR patient did not have access to video capability.  We continued and completed visit with audio only.  Some vital signs Javier be absent or patient reported.   Hearing/Vision screen  Hearing Screening   125Hz  250Hz  500Hz  1000Hz  2000Hz  3000Hz  4000Hz  6000Hz  8000Hz   Right ear:           Left ear:           Comments: Patient is able to hear conversational tones without difficulty.  No issues reported.  Vision Screening Comments: Wears corrective lenses Visual acuity not assessed, virtual visit.       Dietary issues and exercise activities discussed: Current Exercise Habits: Home exercise routine, Type of exercise: walking;stretching, Intensity: Mild  Healthy diet Good water  intake  Goals Addressed              This Visit's Progress     Patient Stated   .  Maintain Healthy Lifestyle (pt-stated)        Stay hydrated  Stay active      Depression Screen [facility] 2/9 Scores 05/24/2021 02/09/2021  07/28/2020 07/26/2020 04/27/2020 10/09/2019 05/21/2019  PHQ - 2 Score 0 0 0 0 0 0 1  PHQ- 9 Score - - - - - 0 10    Fall Risk Fall Risk  05/24/2021 02/09/2021 01/27/2021 10/07/2020 07/28/2020  Falls in the past year? 0 1 1 0 0  Number falls in past yr: 0 0 0 - 0  Injury with Fall? 0 1 1 - 0  Risk Factor Category  - - - - -  Risk for fall due to : - - History of fall(s) Impaired balance/gait -  Risk for fall due to: Comment - - - - -  Follow up Falls evaluation completed Falls evaluation completed Falls evaluation completed Falls evaluation completed;Education provided Falls evaluation completed    FALL RISK PREVENTION PERTAINING TO THE HOME: Handrails in use when climbing stairs? yes Home free of loose throw rugs in walkways, pet beds, electrical cords, etc? Yes  Adequate lighting in your home to reduce risk of falls? Yes   ASSISTIVE DEVICES UTILIZED TO PREVENT FALLS: Life alert? No  Use of a cane, walker or w/c? Yes as needed Grab bars in the bathroom? Yes  Shower chair or bench in shower? Yes  Elevated toilet seat or a handicapped toilet? No    TIMED UP AND GO: Was the test performed? No .   Cognitive Function: Patient is alert and oriented x3.   Enjoys playing words with friends.   MMSE - Mini Mental State Exam 03/25/2018  Orientation to time 5  Orientation to Place 5  Registration 3  Attention/ Calculation 5  Recall 3  Language- name 2 objects 2  Language- repeat 1  Language- follow 3 step command 3  Language- read & follow direction 1  Write a sentence 1  Copy design 1  Total score 30     6CIT Screen 05/24/2021 05/21/2019  What Year? 0 points 0 points  What month? 0 points 0 points  What time? 0 points 0 points  Count back from 20 0 points 0  points  Months in reverse 0 points 0 points  Repeat phrase 0 points 0 points  Total Score 0 0    Immunizations Immunization History  Administered Date(s) Administered  . Influenza Split 10/07/2012, 10/25/2014  . Influenza Whole 10/09/2013  . Influenza,inj,Quad PF,6+ Mos 10/21/2015, 01/02/2018, 10/21/2018  . Pneumococcal Polysaccharide-23 01/04/2016, 05/13/2018  . Tdap 10/21/2004, 05/13/2018   Health Maintenance Health Maintenance  Topic Date Due  . PAP SMEAR-Modifier  04/19/2021  . Zoster Vaccines- Shingrix (1 of 2) 08/24/2021 (Originally 09/04/2009)  . COLONOSCOPY (Pts 45-1yrs Insurance coverage will need to be confirmed)  09/24/2021  . TETANUS/TDAP  05/13/2028  . Hepatitis C Screening  Completed  . HIV Screening  Completed  . HPV VACCINES  Aged Out  . INFLUENZA VACCINE  Discontinued  . MAMMOGRAM  Discontinued  . COVID-19 Vaccine  Discontinued   Pap smear with cpe 07/08/21.   Colorectal cancer screening: Type of screening: Colonoscopy. Completed 09/25/11. Repeat every 10 years  Lung Cancer Screening: (Low Dose CT Chest recommended if Age 45-80 years, 30 pack-year currently smoking OR have quit w/in 15years.) does qualify. Completed 07/29/20.  Dental Screening: Recommended annual dental exams for proper oral hygiene.  Community Resource Referral / Chronic Care Management: CRR required this visit?  No   CCM required this visit?  No      Plan:    Keep all routine maintenance appointments.   I have personally reviewed and noted the following in  the patient's chart:   . Medical and social history . Use of alcohol, tobacco or illicit drugs  . Current medications and supplements including opioid prescriptions.  . Functional ability and status . Nutritional status . Physical activity . Advanced directives . List of other physicians . Hospitalizations, surgeries, and ER visits in previous 12 months . Vitals . Screenings to include cognitive, depression, and  falls . Referrals and appointments  In addition, I have reviewed and discussed with patient certain preventive protocols, quality metrics, and best practice recommendations. A written personalized care plan for preventive services as well as general preventive health recommendations were provided to patient via muychart.     Ashok PallOBrien-Blaney, Aero Drummonds L, LPN   1/61/09605/31/2022

## 2021-06-07 ENCOUNTER — Encounter: Payer: Self-pay | Admitting: Family

## 2021-06-07 ENCOUNTER — Other Ambulatory Visit: Payer: Self-pay | Admitting: Family

## 2021-06-07 DIAGNOSIS — F411 Generalized anxiety disorder: Secondary | ICD-10-CM

## 2021-06-07 MED ORDER — ROSUVASTATIN CALCIUM 20 MG PO TABS: 20.0000 mg | ORAL_TABLET | Freq: Every day | ORAL | 0 refills | Status: DC

## 2021-06-07 MED ORDER — GABAPENTIN 100 MG PO CAPS
100.0000 mg | ORAL_CAPSULE | Freq: Two times a day (BID) | ORAL | 0 refills | Status: DC
Start: 2021-06-07 — End: 2021-06-08

## 2021-06-07 MED ORDER — ESCITALOPRAM OXALATE 20 MG PO TABS: 1.0000 | ORAL_TABLET | Freq: Every day | ORAL | 0 refills | Status: DC

## 2021-06-08 ENCOUNTER — Other Ambulatory Visit: Payer: Self-pay | Admitting: Family

## 2021-06-08 DIAGNOSIS — M5030 Other cervical disc degeneration, unspecified cervical region: Secondary | ICD-10-CM

## 2021-06-08 DIAGNOSIS — G8929 Other chronic pain: Secondary | ICD-10-CM

## 2021-06-08 MED ORDER — GABAPENTIN 100 MG PO CAPS: 100.0000 mg | ORAL_CAPSULE | Freq: Three times a day (TID) | ORAL | 2 refills | Status: DC

## 2021-06-18 DIAGNOSIS — J449 Chronic obstructive pulmonary disease, unspecified: Secondary | ICD-10-CM | POA: Diagnosis not present

## 2021-06-24 ENCOUNTER — Other Ambulatory Visit: Payer: Self-pay

## 2021-06-24 ENCOUNTER — Ambulatory Visit: Payer: Medicare Other | Attending: Anesthesiology | Admitting: Anesthesiology

## 2021-06-24 DIAGNOSIS — M5442 Lumbago with sciatica, left side: Secondary | ICD-10-CM

## 2021-06-24 DIAGNOSIS — G8929 Other chronic pain: Secondary | ICD-10-CM

## 2021-06-24 DIAGNOSIS — M5441 Lumbago with sciatica, right side: Secondary | ICD-10-CM | POA: Diagnosis not present

## 2021-06-24 DIAGNOSIS — G894 Chronic pain syndrome: Secondary | ICD-10-CM

## 2021-06-24 DIAGNOSIS — F1190 Opioid use, unspecified, uncomplicated: Secondary | ICD-10-CM

## 2021-06-24 DIAGNOSIS — M47812 Spondylosis without myelopathy or radiculopathy, cervical region: Secondary | ICD-10-CM | POA: Diagnosis not present

## 2021-06-24 DIAGNOSIS — M5136 Other intervertebral disc degeneration, lumbar region: Secondary | ICD-10-CM

## 2021-06-24 DIAGNOSIS — M47816 Spondylosis without myelopathy or radiculopathy, lumbar region: Secondary | ICD-10-CM

## 2021-06-24 DIAGNOSIS — M5030 Other cervical disc degeneration, unspecified cervical region: Secondary | ICD-10-CM

## 2021-06-24 MED ORDER — HYDROCODONE-ACETAMINOPHEN 5-325 MG PO TABS: 1.0000 | ORAL_TABLET | Freq: Four times a day (QID) | ORAL | 0 refills | Status: AC | PRN

## 2021-06-24 MED ORDER — HYDROCODONE-ACETAMINOPHEN 5-325 MG PO TABS: 1.0000 | ORAL_TABLET | Freq: Four times a day (QID) | ORAL | 0 refills | Status: DC | PRN

## 2021-07-01 NOTE — Progress Notes (Signed)
Virtual Visit via Telephone Note  I connected with Debra Hogan on 07/01/21 at  8:30 AM EDT by telephone and verified that I am speaking with the correct person using two identifiers.  Location: Patient: Home Provider: Pain control center   I discussed the limitations, risks, security and privacy concerns of performing an evaluation and management service by telephone and the availability of in person appointments. I also discussed with the patient that there [place] be a patient responsible charge related to this service. The patient expressed understanding and agreed to proceed.   History of Present Illness: I spoke to Debra Hogan in regards to her low back pain today.  She is still having a significant amount of low back pain with spasming going to the bilateral lower extremities which is her baseline.  No recent changes are reported.  She has had previous interventional therapy that is giving her some relief but for financial reasons she reports limitations on any repeat injections at this time.  She is doing her stretching strengthening exercises as we have reviewed.  She also takes her medications and gets good relief from these.  She documents approximately 50 to 75% improvement with each dosing lasting about 4 to 6 hours before she has been recurrence of the same baseline pain.  Otherwise she is in her usual state of health with no new changes in bowel or bladder function or sensorimotor function.  Review of systems: General: No fevers or chills Pulmonary: No shortness of breath or dyspnea Cardiac: No angina or palpitations or lightheadedness GI: No abdominal pain or constipation Psych: No depression    Observations/Objective:  Current Outpatient Medications:    [START ON 07/23/2021] HYDROcodone-acetaminophen (NORCO/VICODIN) 5-325 MG tablet, Take 1 tablet by mouth every 6 (six) hours as needed for moderate pain or severe pain., Disp: 120 tablet, Rfl: 0   acyclovir (ZOVIRAX) 400 MG tablet,  Take 1 tablet (400 mg total) by mouth daily., Disp: 90 tablet, Rfl: 3   Albuterol Sulfate (PROAIR RESPICLICK) 108 (90 Base) MCG/ACT AEPB, Inhale 1 puff into the lungs every 6 (six) hours as needed (as needed for cough, wheezing)., Disp: 1 each, Rfl: 0   ALPRAZolam (XANAX) 0.25 MG tablet, Take 1 tablet (0.25 mg total) by mouth 2 (two) times daily as needed for anxiety., Disp: 20 tablet, Rfl: 1   aspirin EC 81 MG tablet, Take 81 mg by mouth daily., Disp: , Rfl:    cholecalciferol (VITAMIN D) 1000 units tablet, Take 1,000 Units by mouth daily., Disp: , Rfl:    diclofenac sodium (VOLTAREN) 1 % GEL, Apply 4 g topically 4 (four) times daily., Disp: 1 Tube, Rfl: 3   diltiazem (CARDIZEM CD) 180 MG 24 hr capsule, Take 1 capsule (180 mg total) by mouth daily. Please schedule office visit for further refills. Thank you!, Disp: 30 capsule, Rfl: 0   escitalopram (LEXAPRO) 20 MG tablet, Take 1 tablet (20 mg total) by mouth daily., Disp: 90 tablet, Rfl: 0   fluticasone-salmeterol (ADVAIR DISKUS) 250-50 MCG/ACT AEPB, Inhale 1 puff into the lungs in the morning and at bedtime., Disp: 60 each, Rfl: 1   gabapentin (NEURONTIN) 100 MG capsule, Take 1 capsule (100 mg total) by mouth 3 (three) times daily., Disp: 180 capsule, Rfl: 2   hydrochlorothiazide (MICROZIDE) 12.5 MG capsule, Take 1 capsule (12.5 mg total) by mouth daily., Disp: 90 capsule, Rfl: 0   HYDROcodone-acetaminophen (NORCO/VICODIN) 5-325 MG tablet, Take 1 tablet by mouth every 6 (six) hours as needed for moderate  pain or severe pain., Disp: 120 tablet, Rfl: 0   levothyroxine (SYNTHROID) 50 MCG tablet, Take 1 tablet (50 mcg total) by mouth daily before breakfast. Take one tablet daily before breakfast on empty stomach., Disp: 90 tablet, Rfl: 1   lisinopril (ZESTRIL) 20 MG tablet, Take 1 tablet (20 mg total) by mouth daily., Disp: 90 tablet, Rfl: 3   Multiple Vitamins-Minerals (CENTRUM SILVER ULTRA WOMENS) TABS, Take 1 tablet by mouth daily., Disp: , Rfl:     nitroGLYCERIN (NITROSTAT) 0.4 MG SL tablet, Place 1 tablet (0.4 mg total) under the tongue every 5 (five) minutes as needed for chest pain., Disp: 25 tablet, Rfl: 3   ondansetron (ZOFRAN-ODT) 4 MG disintegrating tablet, Take 1 tablet (4 mg total) by mouth every 6 (six) hours as needed for nausea or vomiting., Disp: 20 tablet, Rfl: 2   potassium chloride (KLOR-CON) 10 MEQ tablet, Take 1 tablet (10 mEq total) by mouth daily., Disp: 90 tablet, Rfl: 1   rosuvastatin (CRESTOR) 20 MG tablet, Take 1 tablet (20 mg total) by mouth daily., Disp: 90 tablet, Rfl: 0   tiotropium (SPIRIVA HANDIHALER) 18 MCG inhalation capsule, Place 1 capsule (18 mcg total) into inhaler and inhale daily., Disp: 30 capsule, Rfl: 12   Assessment and Plan: 1. DDD (degenerative disc disease), lumbar   2. DDD (degenerative disc disease), cervical   3. Chronic bilateral low back pain with bilateral sciatica   4. Facet arthropathy, cervical   5. Chronic, continuous use of opioids   6. Chronic pain syndrome   7. Lumbar facet joint syndrome   Based on our discussion today and upon review of the [facility] practitioner database information I am going to refill her medications.  I have reviewed her recent UDS from [place] and it is appropriate as well.  She continues to derive good functional benefit from the opioid medication therapy with no side effects reported.  No other changes will be initiated today and refills will be for the next 2 months for July 1 and July 31 with return to clinic scheduled in 2 months.  Continue follow-up with her primary care physician for baseline medical care.  Follow Up Instructions:    I discussed the assessment and treatment plan with the patient. The patient was provided an opportunity to ask questions and all were answered. The patient agreed with the plan and demonstrated an understanding of the instructions.   The patient was advised to call back or seek an in-person evaluation if the symptoms  worsen or if the condition fails to improve as anticipated.  I provided 25 minutes of non-face-to-face time during this encounter.   Yevette Edwards, MD

## 2021-07-08 ENCOUNTER — Encounter: Payer: Self-pay | Admitting: Family

## 2021-07-08 ENCOUNTER — Ambulatory Visit (INDEPENDENT_AMBULATORY_CARE_PROVIDER_SITE_OTHER): Payer: Medicare Other | Admitting: Family

## 2021-07-08 ENCOUNTER — Other Ambulatory Visit: Payer: Self-pay

## 2021-07-08 DIAGNOSIS — M7542 Impingement syndrome of left shoulder: Secondary | ICD-10-CM | POA: Diagnosis not present

## 2021-07-08 DIAGNOSIS — M25519 Pain in unspecified shoulder: Secondary | ICD-10-CM | POA: Insufficient documentation

## 2021-07-08 DIAGNOSIS — R000 Tachycardia, unspecified: Secondary | ICD-10-CM

## 2021-07-08 DIAGNOSIS — I10 Essential (primary) hypertension: Secondary | ICD-10-CM

## 2021-07-08 DIAGNOSIS — M25512 Pain in left shoulder: Secondary | ICD-10-CM

## 2021-07-08 MED ORDER — DILTIAZEM HCL ER COATED BEADS 180 MG PO CP24: 180.0000 mg | ORAL_CAPSULE | Freq: Every day | ORAL | 1 refills | Status: DC

## 2021-07-08 NOTE — Patient Instructions (Addendum)
Please resume cardizem 180mg  and ensure you make a follow up with Dr  Please go directly to [facility] for evaluation of left shoulder pain.    Information below:  [facility] [address]  Monday-Friday 8am-9pm Saturday and Sunday 9am- 9pm   [phone]

## 2021-07-08 NOTE — Progress Notes (Signed)
Subjective:    Patient ID: Debra Hogan, female    DOB: 07-19-1961, 60 y.o.   MRN: [id number]  CC: Debra Hogan is a 60 y.o. female who presents today for an acute visit.    HPI: Left shoulder pain x 2 weeks ago, unchanged  Pain is throb, stab and radiating left arm. Pain improves with rest, worse with lifting arm. Difficulty with getting dressed.   Norco 5-325 mg q6 hours with little relief.   NO fever, swelling, HA, vision loss, numbness.   Onset after lifting vacuum up and over a bed.   She lifted vacuum and heard audible pop from left shoulder and followed by 'severe pain'      She follows with Dr Pernell Dupre for DDD whom prescribes hydrocodone- acetaminophen. Compliant with gabapentin 100mg  tid.   HTN- compliant with lisinopril 20mg  . She has run out of diltiazem 180mg  as prescribed by 08/23/21 . She has nitroglycerin for cp, twice in the last 2 months.   COPD- follows with Dr . SOB at baseline today. She is wearing nasal cannula.   HISTORY:  Past Medical History:  Diagnosis Date   Anginal pain (HCC)    Arthritis    COPD (chronic obstructive pulmonary disease) (HCC)    Depression    Dyspnea    Fibromyalgia    Headache    Heat stroke 2006   Irritable bowel syndrome    MS (multiple sclerosis) (HCC)    Followed by Dr. 08/25/21   Past Surgical History:  Procedure Laterality Date   ABDOMINAL HYSTERECTOMY     CERVIX intact; had for noncancerous reasons, endometriosis   CHOLECYSTECTOMY N/A 01/03/2016   Procedure: LAPAROSCOPIC CHOLECYSTECTOMY WITH INTRAOPERATIVE CHOLANGIOGRAM;  Surgeon: 2007, MD;  Location: [facility];  Service: General;  Laterality: N/A;   ENDOSCOPIC RETROGRADE CHOLANGIOPANCREATOGRAPHY (ERCP) WITH PROPOFOL N/A 01/04/2016   Procedure: ENDOSCOPIC RETROGRADE CHOLANGIOPANCREATOGRAPHY (ERCP) WITH PROPOFOL;  Surgeon: 03/02/2016, MD;  Location: [facility];  Service: Endoscopy;  Laterality: N/A;   FOOT SURGERY  2001    LAPAROSCOPIC ENDOMETRIOSIS FULGURATION     RIGHT/LEFT HEART CATH AND CORONARY ANGIOGRAPHY Bilateral 09/29/2019   Procedure: RIGHT/LEFT HEART CATH AND CORONARY ANGIOGRAPHY;  Surgeon: Midge Minium, MD;  Location: [facility];  Service: Cardiovascular;  Laterality: Bilateral;   Family History  Problem Relation Age of Onset   Hypertension Mother    Hyperlipidemia Mother    Arthritis Mother    Cancer Father        throat   Heart disease Brother    Bipolar disorder Son    Schizophrenia Son    Cancer Maternal Grandfather        liver   Cancer Paternal Grandmother        liver    Allergies: Valium [diazepam] Current Outpatient Medications on File Prior to Visit  Medication Sig Dispense Refill   acyclovir (ZOVIRAX) 400 MG tablet Take 1 tablet (400 mg total) by mouth daily. 90 tablet 3   Albuterol Sulfate (PROAIR RESPICLICK) 108 (90 Base) MCG/ACT AEPB Inhale 1 puff into the lungs every 6 (six) hours as needed (as needed for cough, wheezing). 1 each 0   ALPRAZolam (XANAX) 0.25 MG tablet Take 1 tablet (0.25 mg total) by mouth 2 (two) times daily as needed for anxiety. 20 tablet 1   aspirin EC 81 MG tablet Take 81 mg by mouth daily.     cholecalciferol (VITAMIN D) 1000 units tablet Take 1,000 Units by mouth daily.  diclofenac sodium (VOLTAREN) 1 % GEL Apply 4 g topically 4 (four) times daily. 1 Tube 3   escitalopram (LEXAPRO) 20 MG tablet Take 1 tablet (20 mg total) by mouth daily. 90 tablet 0   fluticasone-salmeterol (ADVAIR DISKUS) 250-50 MCG/ACT AEPB Inhale 1 puff into the lungs in the morning and at bedtime. 60 each 1   gabapentin (NEURONTIN) 100 MG capsule Take 1 capsule (100 mg total) by mouth 3 (three) times daily. 180 capsule 2   HYDROcodone-acetaminophen (NORCO/VICODIN) 5-325 MG tablet Take 1 tablet by mouth every 6 (six) hours as needed for moderate pain or severe pain. 120 tablet 0   [START ON 07/23/2021] HYDROcodone-acetaminophen (NORCO/VICODIN) 5-325 MG tablet Take 1  tablet by mouth every 6 (six) hours as needed for moderate pain or severe pain. 120 tablet 0   levothyroxine (SYNTHROID) 50 MCG tablet Take 1 tablet (50 mcg total) by mouth daily before breakfast. Take one tablet daily before breakfast on empty stomach. 90 tablet 1   lisinopril (ZESTRIL) 20 MG tablet Take 1 tablet (20 mg total) by mouth daily. 90 tablet 3   Multiple Vitamins-Minerals (CENTRUM SILVER ULTRA WOMENS) TABS Take 1 tablet by mouth daily.     ondansetron (ZOFRAN-ODT) 4 MG disintegrating tablet Take 1 tablet (4 mg total) by mouth every 6 (six) hours as needed for nausea or vomiting. 20 tablet 2   potassium chloride (KLOR-CON) 10 MEQ tablet Take 1 tablet (10 mEq total) by mouth daily. 90 tablet 1   rosuvastatin (CRESTOR) 20 MG tablet Take 1 tablet (20 mg total) by mouth daily. 90 tablet 0   nitroGLYCERIN (NITROSTAT) 0.4 MG SL tablet Place 1 tablet (0.4 mg total) under the tongue every 5 (five) minutes as needed for chest pain. 25 tablet 3   No current facility-administered medications on file prior to visit.    Social History   Tobacco Use   Smoking status: Every Day    Packs/day: 1.00    Years: 43.00    Pack years: 43.00    Types: Cigarettes   Smokeless tobacco: Never  Vaping Use   Vaping Use: Never used  Substance Use Topics   Alcohol use: No    Alcohol/week: 0.0 standard drinks   Drug use: No    Review of Systems  Constitutional:  Negative for chills and fever.  Respiratory:  Negative for cough.   Cardiovascular:  Negative for chest pain and palpitations.  Gastrointestinal:  Negative for nausea and vomiting.  Musculoskeletal:  Positive for arthralgias and back pain (chronic). Negative for joint swelling and neck pain.  Skin:  Negative for rash.  Neurological:  Negative for numbness.     Objective:    BP 114/68 (BP Location: Left Arm, Patient Position: Sitting, Cuff Size: Large)   Pulse (!) 101   Temp 98.5 F (36.9 C) (Oral)   Ht 5\' 5"  (1.651 m)   Wt 162 lb 9.6  oz (73.8 kg)   SpO2 98%   BMI 27.06 kg/m    Physical Exam Vitals reviewed.  Constitutional:      Appearance: She is well-developed.  Eyes:     Conjunctiva/sclera: Conjunctivae normal.  Cardiovascular:     Rate and Rhythm: Normal rate and regular rhythm.     Pulses: Normal pulses.     Heart sounds: Normal heart sounds.  Pulmonary:     Effort: Pulmonary effort is normal.     Breath sounds: Normal breath sounds. No wheezing, rhonchi or rales.  Musculoskeletal:     Right shoulder:  No swelling, tenderness or bony tenderness.     Left shoulder: Tenderness present. No swelling. Decreased range of motion.     Comments: Left shoulder:   No asymmetry of shoulders when comparing right and left.pain with palpation over AC joint. Excruciating pain with internal and external rotation, resisted lateral extension . She is unable to raise arm laterally above level of shoulder.  Negative drop arm.   No pain, swelling, or ecchymosis noted over long head of biceps.  No bicep pain with resisted flexion and extension.  Negative speeds test. Negative Popeye's sign.   Strength and sensation normal BUE's.   Skin:    General: Skin is warm and dry.  Neurological:     Mental Status: She is alert.  Psychiatric:        Speech: Speech normal.        Behavior: Behavior normal.        Thought Content: Thought content normal.       Assessment & Plan:   Problem List Items Addressed This Visit       Cardiovascular and Mediastinum   HTN (hypertension)    Controlled. She had stopped cardizem 180mg .Advised to resume along with lisinopril 20mg  in setting of atypical CP as prescribed from [facility].   Advised her to make a follow up with cardiology and offered to call and make appointment for her; she declines and would like to call herself.        Relevant Medications   diltiazem (CARDIZEM CD) 180 MG 24 hr capsule     Other   Left shoulder pain    Severe, uncontrolled. Concern for rotator  cuff injury. Advised to go immediately to [facility] today for evaluation. She agreed to go directly and has family member to drive her there this afternoon. Will follow.        Other Visit Diagnoses     Sinus tachycardia       Relevant Medications   diltiazem (CARDIZEM CD) 180 MG 24 hr capsule         I have discontinued Debra Hogan's hydrochlorothiazide and Spiriva HandiHaler. I am also having her maintain her Centrum Silver [facility], cholecalciferol, diclofenac sodium, nitroGLYCERIN, aspirin EC, acyclovir, ondansetron, lisinopril, ProAir RespiClick, potassium chloride, ALPRAZolam, levothyroxine, fluticasone-salmeterol, escitalopram, rosuvastatin, gabapentin, HYDROcodone-acetaminophen, HYDROcodone-acetaminophen, and diltiazem.   Meds ordered this encounter  Medications   diltiazem (CARDIZEM CD) 180 MG 24 hr capsule    Sig: Take 1 capsule (180 mg total) by mouth daily. Please schedule office visit for further refills. Thank you!    Dispense:  30 capsule    Refill:  1    Order Specific Question:   Supervising Provider    Answer:   [facility] [[id number]]    Return precautions given.   Risks, benefits, and alternatives of the medications and treatment plan prescribed today were discussed, and patient expressed understanding.   Education regarding symptom management and diagnosis given to patient on AVS.  Continue to follow with [employer], FNP for routine health maintenance.   Dotti D Cwik and I agreed with plan.   Sherlene Shams, FNP

## 2021-07-08 NOTE — Assessment & Plan Note (Signed)
Severe, uncontrolled. Concern for rotator cuff injury. Advised to go immediately to [facility] today for evaluation. She agreed to go directly and has family member to drive her there this afternoon. Will follow.

## 2021-07-08 NOTE — Assessment & Plan Note (Signed)
Controlled. She had stopped cardizem 180mg .Advised to resume along with lisinopril 20mg  in setting of atypical CP as prescribed from [facility].   Advised her to make a follow up with cardiology and offered to call and make appointment for her; she declines and would like to call herself.

## 2021-07-28 ENCOUNTER — Ambulatory Visit: Payer: Medicare Other | Attending: Anesthesiology | Admitting: Anesthesiology

## 2021-07-28 ENCOUNTER — Encounter: Payer: Self-pay | Admitting: Anesthesiology

## 2021-07-28 ENCOUNTER — Other Ambulatory Visit: Payer: Self-pay

## 2021-07-28 DIAGNOSIS — M5136 Other intervertebral disc degeneration, lumbar region: Secondary | ICD-10-CM

## 2021-07-28 DIAGNOSIS — M47812 Spondylosis without myelopathy or radiculopathy, cervical region: Secondary | ICD-10-CM

## 2021-07-28 DIAGNOSIS — G894 Chronic pain syndrome: Secondary | ICD-10-CM

## 2021-07-28 DIAGNOSIS — M47816 Spondylosis without myelopathy or radiculopathy, lumbar region: Secondary | ICD-10-CM

## 2021-07-28 DIAGNOSIS — M5441 Lumbago with sciatica, right side: Secondary | ICD-10-CM

## 2021-07-28 DIAGNOSIS — M5030 Other cervical disc degeneration, unspecified cervical region: Secondary | ICD-10-CM

## 2021-07-28 DIAGNOSIS — F1190 Opioid use, unspecified, uncomplicated: Secondary | ICD-10-CM

## 2021-07-28 DIAGNOSIS — G8929 Other chronic pain: Secondary | ICD-10-CM

## 2021-07-28 DIAGNOSIS — M5442 Lumbago with sciatica, left side: Secondary | ICD-10-CM | POA: Diagnosis not present

## 2021-07-28 MED ORDER — HYDROCODONE-ACETAMINOPHEN 5-325 MG PO TABS: 1.0000 | ORAL_TABLET | Freq: Four times a day (QID) | ORAL | 0 refills | Status: AC | PRN

## 2021-07-28 NOTE — Progress Notes (Signed)
Virtual Visit via Telephone Note  I connected with Debra Hogan on 07/28/21 at  4:00 PM EDT by telephone and verified that I am speaking with the correct person using two identifiers.  Location: Patient: Home Provider: Pain control center   I discussed the limitations, risks, security and privacy concerns of performing an evaluation and management service by telephone and the availability of in person appointments. I also discussed with the patient that there [place] be a patient responsible charge related to this service. The patient expressed understanding and agreed to proceed.   History of Present Illness:  I spoke with Debra Hogan today via telephone as we were unable to link for the video portion of virtual conference.  She continues to have a significant amount of cervical thoracic and lumbar pain.  Furthermore she is having a lot of shoulder pain which is relatively new onset but has been recently evaluated by orthopedics.  With her current pain syndrome she is taking her opioid medications on average 3-4 times a day.  She gets good relief from these and these enable her to continue to function better pain has continued to be quite severe.  No changes in the quality characteristic or distribution of the neck thoracic or lumbar pain are noted.  She reports that she tries to do her physical therapy stretching strengthening exercises but is limited.  She has been taking some anti-inflammatories to help with the shoulder pain additionally.  Otherwise she is in her usual state of health at this point.  She has gotten good relief in the past with the opioid medications unfortunately has failed more conservative therapy.  No changes in lower extremity strength or function or bowel or bladder function are noted.  Review of systems: General: No fevers or chills Pulmonary: No shortness of breath or dyspnea Cardiac: No angina or palpitations or lightheadedness GI: No abdominal pain or constipation Psych:  No depression  Observations/Objective:  Current Outpatient Medications:    [START ON 08/27/2021] HYDROcodone-acetaminophen (NORCO/VICODIN) 5-325 MG tablet, Take 1 tablet by mouth every 6 (six) hours as needed for moderate pain or severe pain., Disp: 120 tablet, Rfl: 0   acyclovir (ZOVIRAX) 400 MG tablet, Take 1 tablet (400 mg total) by mouth daily., Disp: 90 tablet, Rfl: 3   Albuterol Sulfate (PROAIR RESPICLICK) 108 (90 Base) MCG/ACT AEPB, Inhale 1 puff into the lungs every 6 (six) hours as needed (as needed for cough, wheezing)., Disp: 1 each, Rfl: 0   ALPRAZolam (XANAX) 0.25 MG tablet, Take 1 tablet (0.25 mg total) by mouth 2 (two) times daily as needed for anxiety., Disp: 20 tablet, Rfl: 1   aspirin EC 81 MG tablet, Take 81 mg by mouth daily., Disp: , Rfl:    cholecalciferol (VITAMIN D) 1000 units tablet, Take 1,000 Units by mouth daily., Disp: , Rfl:    diclofenac sodium (VOLTAREN) 1 % GEL, Apply 4 g topically 4 (four) times daily., Disp: 1 Tube, Rfl: 3   diltiazem (CARDIZEM CD) 180 MG 24 hr capsule, Take 1 capsule (180 mg total) by mouth daily. Please schedule office visit for further refills. Thank you!, Disp: 30 capsule, Rfl: 1   escitalopram (LEXAPRO) 20 MG tablet, Take 1 tablet (20 mg total) by mouth daily., Disp: 90 tablet, Rfl: 0   fluticasone-salmeterol (ADVAIR DISKUS) 250-50 MCG/ACT AEPB, Inhale 1 puff into the lungs in the morning and at bedtime., Disp: 60 each, Rfl: 1   gabapentin (NEURONTIN) 100 MG capsule, Take 1 capsule (100 mg total) by  mouth 3 (three) times daily., Disp: 180 capsule, Rfl: 2   HYDROcodone-acetaminophen (NORCO/VICODIN) 5-325 MG tablet, Take 1 tablet by mouth every 6 (six) hours as needed for moderate pain or severe pain., Disp: 120 tablet, Rfl: 0   levothyroxine (SYNTHROID) 50 MCG tablet, Take 1 tablet (50 mcg total) by mouth daily before breakfast. Take one tablet daily before breakfast on empty stomach., Disp: 90 tablet, Rfl: 1   lisinopril (ZESTRIL) 20 MG tablet,  Take 1 tablet (20 mg total) by mouth daily., Disp: 90 tablet, Rfl: 3   Multiple Vitamins-Minerals (CENTRUM SILVER ULTRA WOMENS) TABS, Take 1 tablet by mouth daily., Disp: , Rfl:    nitroGLYCERIN (NITROSTAT) 0.4 MG SL tablet, Place 1 tablet (0.4 mg total) under the tongue every 5 (five) minutes as needed for chest pain., Disp: 25 tablet, Rfl: 3   ondansetron (ZOFRAN-ODT) 4 MG disintegrating tablet, Take 1 tablet (4 mg total) by mouth every 6 (six) hours as needed for nausea or vomiting., Disp: 20 tablet, Rfl: 2   potassium chloride (KLOR-CON) 10 MEQ tablet, Take 1 tablet (10 mEq total) by mouth daily., Disp: 90 tablet, Rfl: 1   rosuvastatin (CRESTOR) 20 MG tablet, Take 1 tablet (20 mg total) by mouth daily., Disp: 90 tablet, Rfl: 0   Assessment and Plan: 1. DDD (degenerative disc disease), lumbar   2. DDD (degenerative disc disease), cervical   3. Chronic bilateral low back pain with bilateral sciatica   4. Facet arthropathy, cervical   5. Chronic, continuous use of opioids   6. Chronic pain syndrome   7. Lumbar facet joint syndrome   I have reviewed the [facility] practitioner database information and it appears appropriate to refill her medications for the next 2 months.  Months.  She had a July 31 prescription E scribed to pharmacy but was unaware of this and we are therefore proceeding with in August 4 and September 3 refill for her opioids.  She is done well with these and we will make no other changes in her pain management protocol.  Unfortunately her pain syndrome has been very difficult to treat and she is reliant on these medications where she has failed conservative therapy.  She reports improved overall functional lifestyle and no side effects with the medications.  I want her to continue with stretching strengthening exercises as I discussed with her today.  I have also advocated that she utilize the anti-inflammatories for the shoulder pain and ice application in addition to  stretching exercises as reviewed.  We will schedule her for return to clinic in 2 months and continue follow-up with orthopedics and her primary care physicians for baseline medical care.  Follow Up Instructions:    I discussed the assessment and treatment plan with the patient. The patient was provided an opportunity to ask questions and all were answered. The patient agreed with the plan and demonstrated an understanding of the instructions.   The patient was advised to call back or seek an in-person evaluation if the symptoms worsen or if the condition fails to improve as anticipated.  I provided 30 minutes of non-face-to-face time during this encounter.   Yevette Edwards, MD

## 2021-08-06 DIAGNOSIS — M25512 Pain in left shoulder: Secondary | ICD-10-CM | POA: Diagnosis not present

## 2021-08-18 NOTE — Telephone Encounter (Signed)
error 

## 2021-09-15 ENCOUNTER — Other Ambulatory Visit: Payer: Self-pay | Admitting: Family

## 2021-09-15 ENCOUNTER — Encounter: Payer: Self-pay | Admitting: Family

## 2021-09-15 DIAGNOSIS — R000 Tachycardia, unspecified: Secondary | ICD-10-CM

## 2021-09-16 ENCOUNTER — Telehealth: Payer: Self-pay

## 2021-09-16 NOTE — Telephone Encounter (Signed)
Pt requests a refill of Diltiazem and Rosuvastatin. Medication has been pended by Rennie Plowman, Ok to refill?

## 2021-09-20 MED ORDER — DILTIAZEM HCL ER COATED BEADS 180 MG PO CP24: ORAL_CAPSULE | ORAL | 0 refills | Status: DC

## 2021-09-20 MED ORDER — ROSUVASTATIN CALCIUM 20 MG PO TABS: 20.0000 mg | ORAL_TABLET | Freq: Every day | ORAL | 0 refills | Status: DC

## 2021-09-20 NOTE — Telephone Encounter (Signed)
Medication has been refilled.

## 2021-09-25 IMAGING — US US EXTREM LOW*L* LIMITED
1 series · 13 of 25 positions shown · non-contrast
Comparison: None.

CLINICAL DATA: Left leg pain and swelling, recent fall

EXAM:
LEFT LOWER EXTREMITY VENOUS DOPPLER ULTRASOUND
TECHNIQUE: Gray-scale sonography with compression, as well as color and duplex
ultrasound, were performed to evaluate the deep venous system(s)
from the level of the common femoral vein through the popliteal and
proximal calf veins.

[Series 1: us venous img lower uni left (dvt) · portal-venous · 13 of 46 slices shown]
[im 1/46]
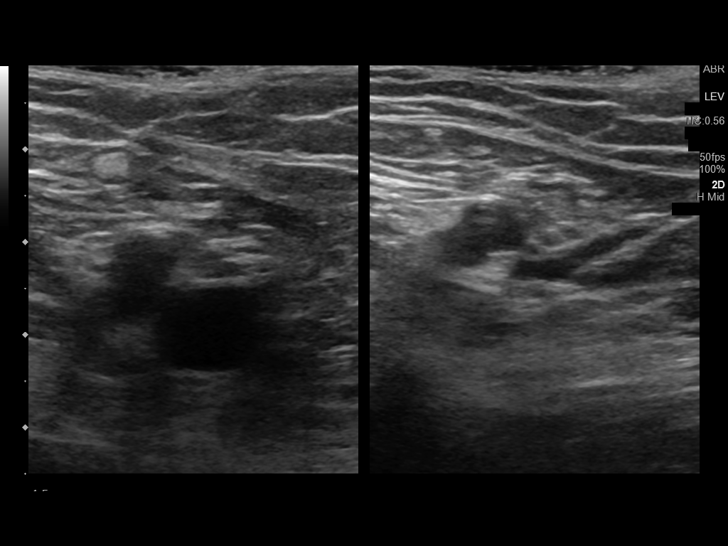
[im 4/46]
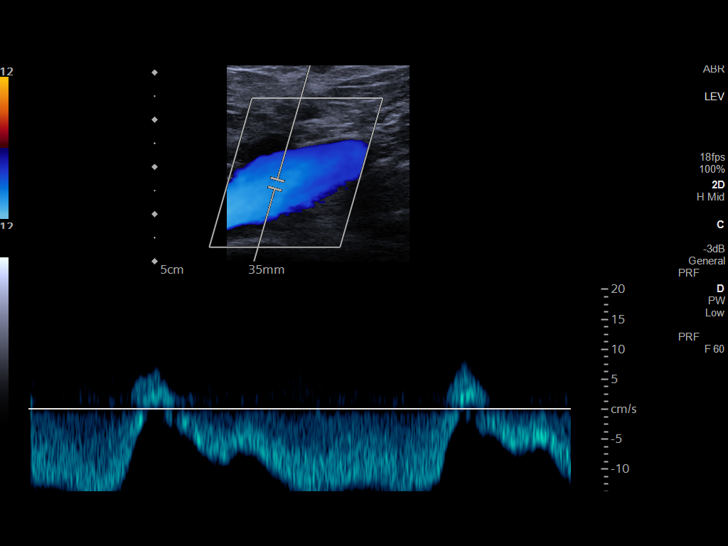
[im 8/46]
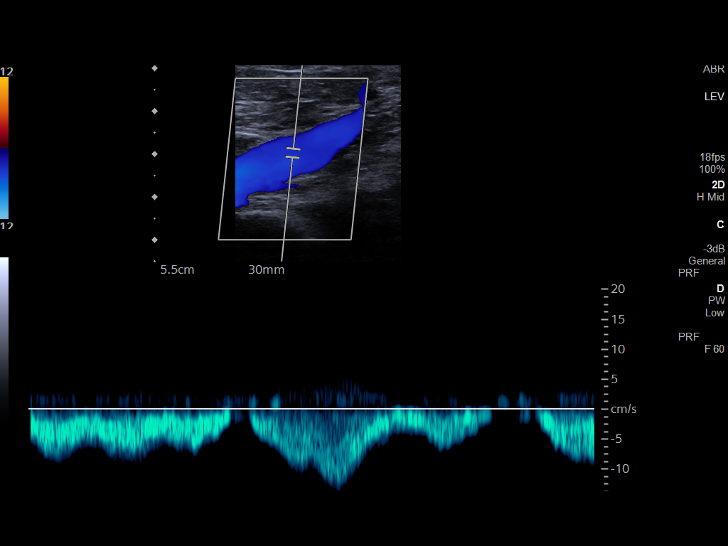
[im 12/46]
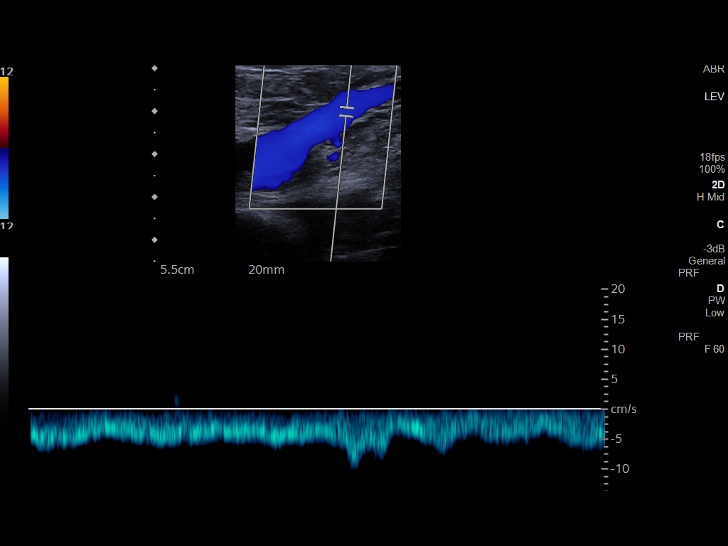
[im 16/46]
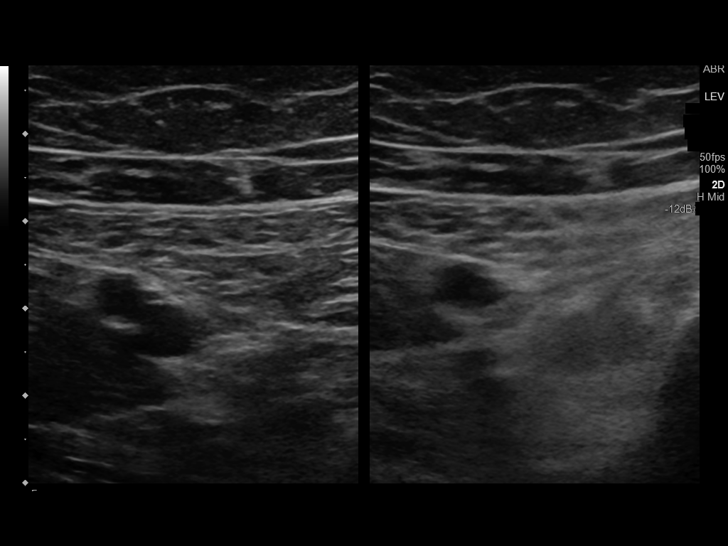
[im 19/46]
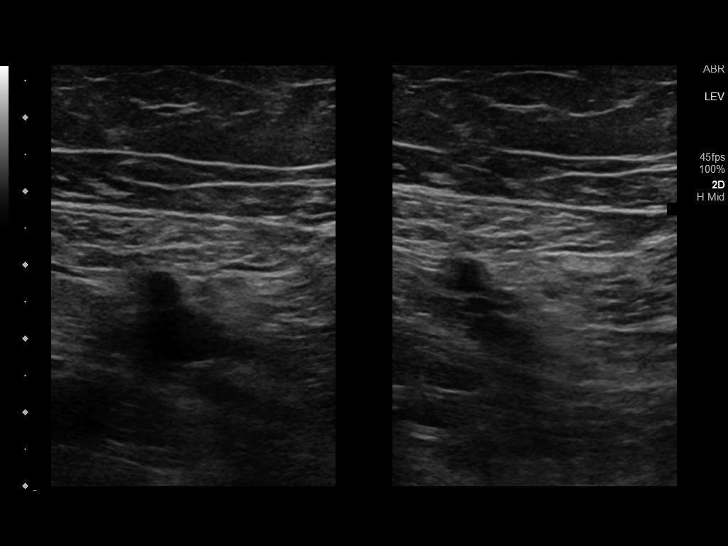
[im 23/46]
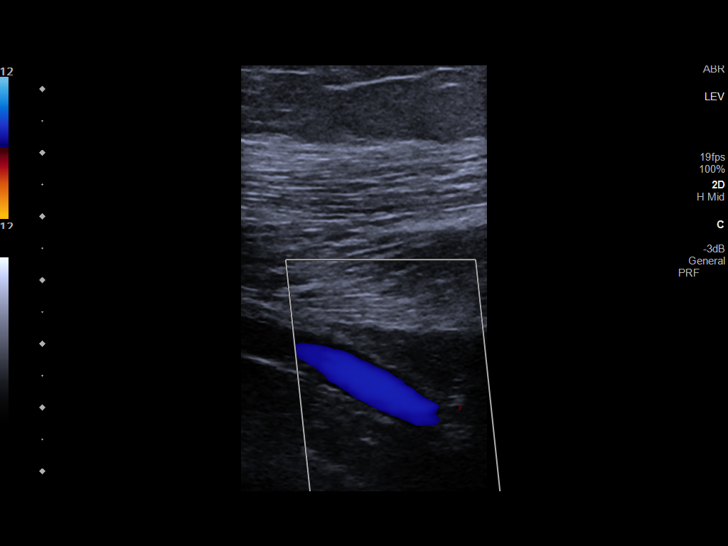
[im 27/46]
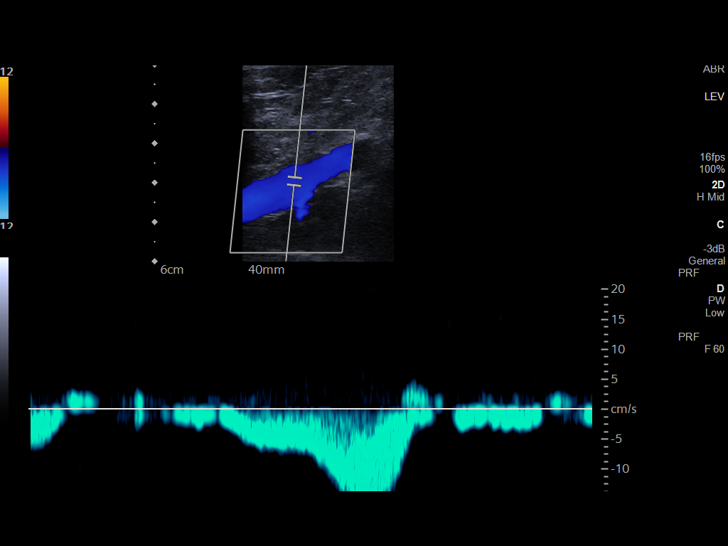
[im 31/46]
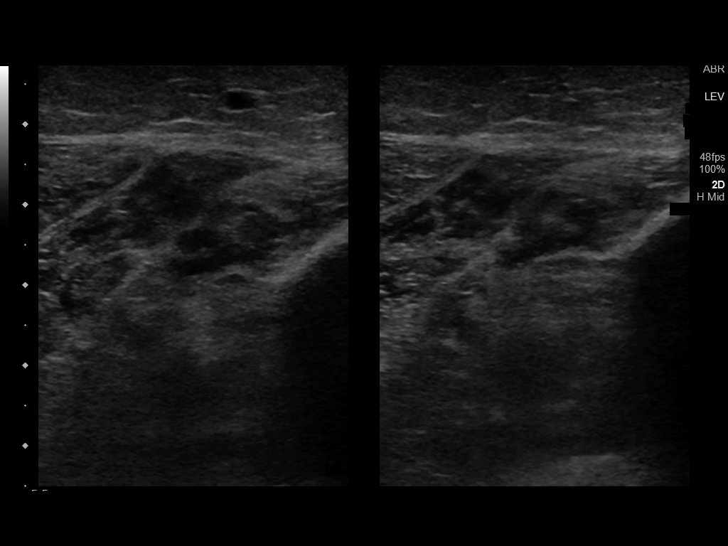
[im 34/46]
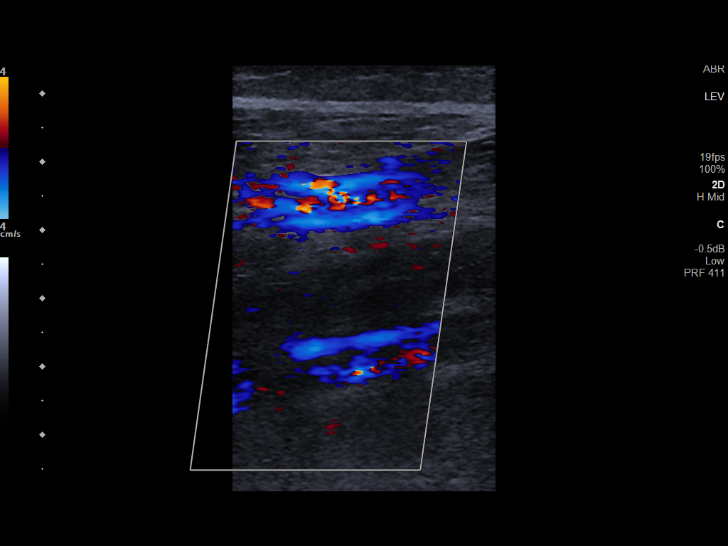
[im 38/46]
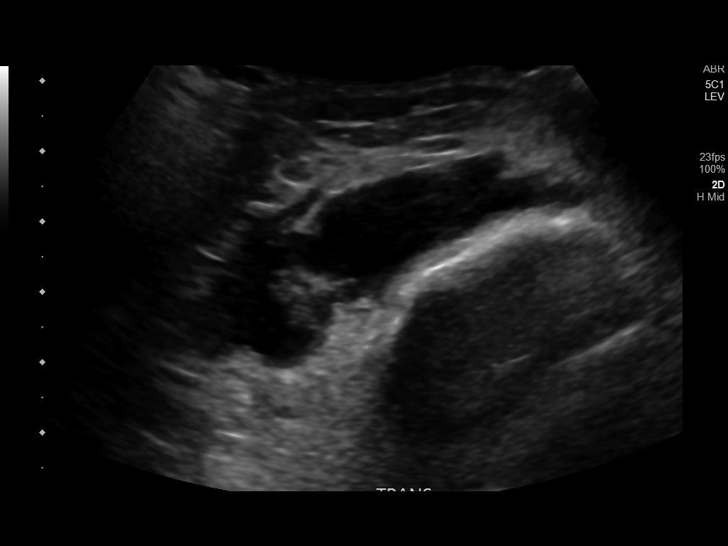
[im 42/46]
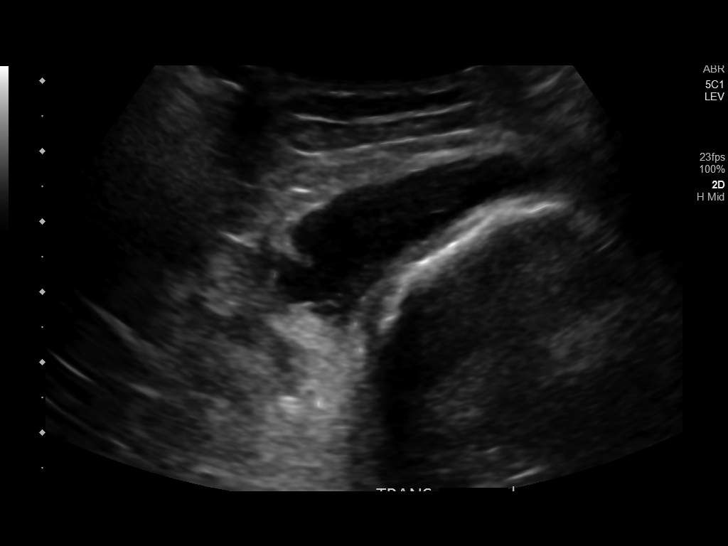
[im 46/46]
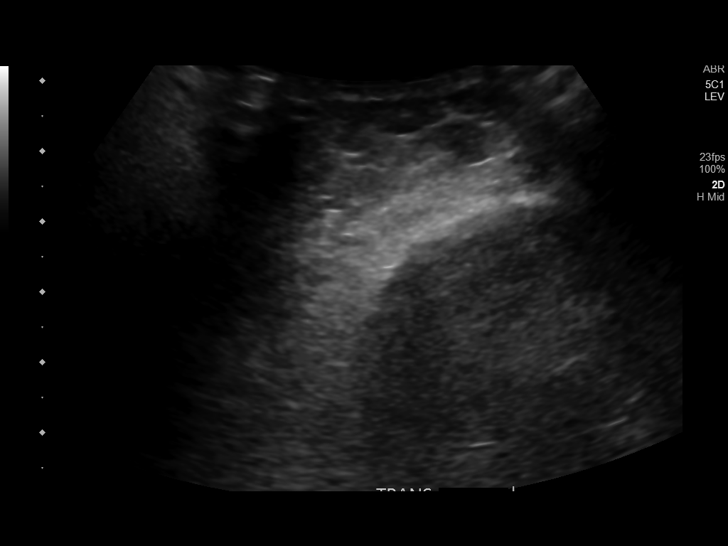

[13 of 25 positions shown; findings below may reference images not displayed]

FINDINGS: VENOUS

Normal compressibility of the common femoral, superficial femoral,
and popliteal veins, as well as the visualized calf veins.
Visualized portions of profunda femoral vein and great saphenous
vein unremarkable. No filling defects to suggest DVT on grayscale or
color Doppler imaging. Doppler waveforms show normal direction of
venous flow, normal respiratory plasticity and response to
augmentation.

Limited views of the contralateral common femoral vein are
unremarkable.

OTHER

Limited sonography in the area of palpable abnormality involving the
medial left calf demonstrates a 5.2 x 1.7 x 5.7 cm complex
subcutaneous fluid collection containing internal debris and septa
possibly representing a subcutaneous hematoma in this recently
traumatized patient. There is no significant internal vascularity or
surrounding hyperemia.

Limitations: none
IMPRESSION: No femoropopliteal DVT within the left lower extremity.

5.7 cm complex fluid collection within the subcutaneous soft tissues
of the medial left calf possibly representing a subcutaneous
hematoma.

## 2021-09-25 IMAGING — US US EXTREM LOW VENOUS*L*
1 series · 13 of 24 positions shown · non-contrast
Comparison: None.

CLINICAL DATA: Left leg pain and swelling, recent fall

EXAM:
LEFT LOWER EXTREMITY VENOUS DOPPLER ULTRASOUND
TECHNIQUE: Gray-scale sonography with compression, as well as color and duplex
ultrasound, were performed to evaluate the deep venous system(s)
from the level of the common femoral vein through the popliteal and
proximal calf veins.

[Series 1: us venous img lower uni left (dvt) · portal-venous · 13 of 46 slices shown]
[im 1/46]
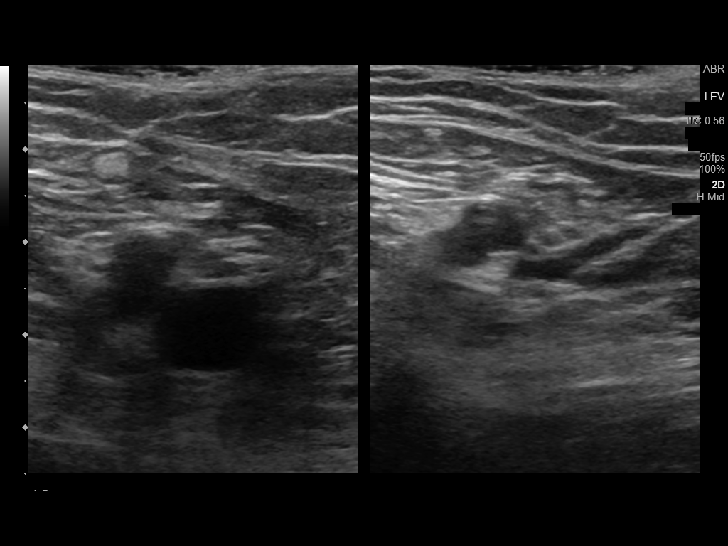
[im 4/46]
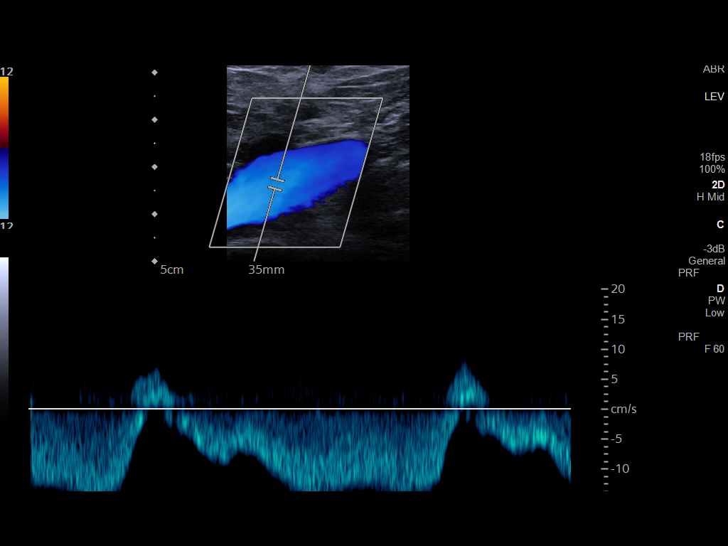
[im 8/46]
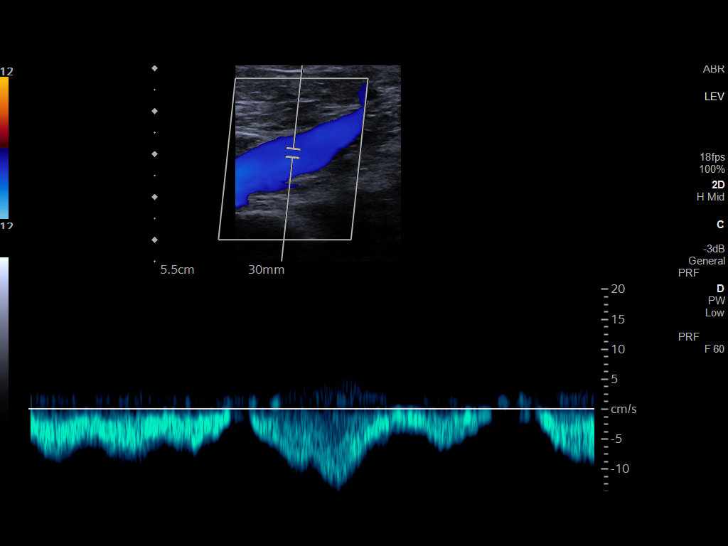
[im 12/46]
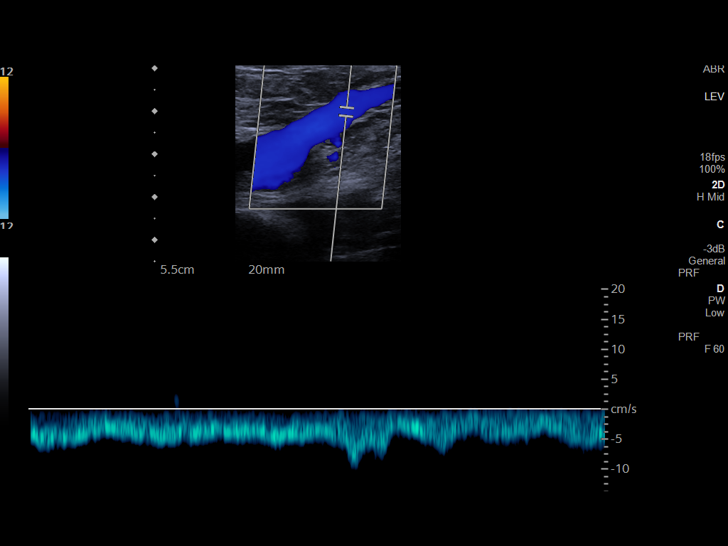
[im 16/46]
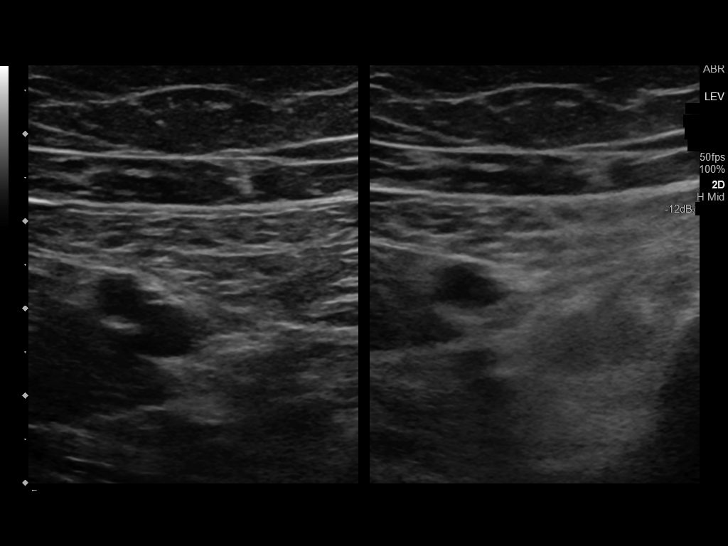
[im 20/46]
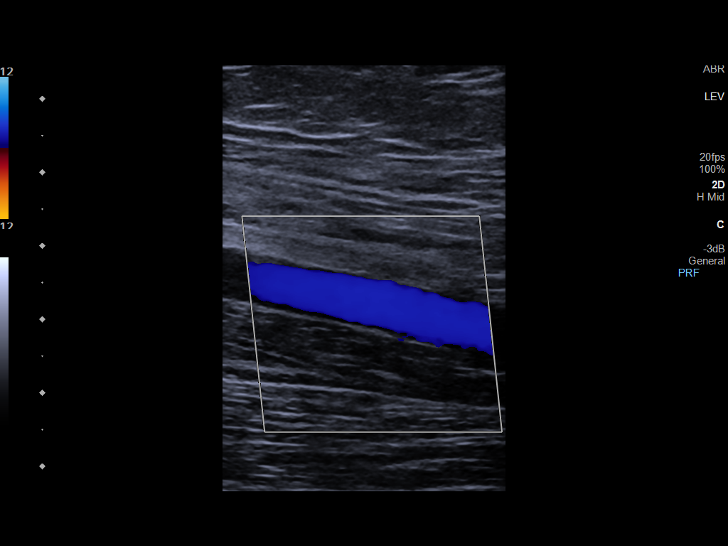
[im 24/46]
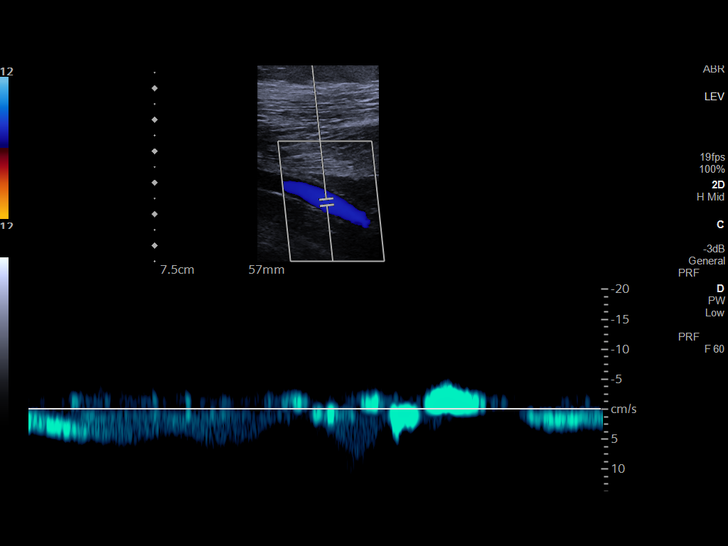
[im 26/46]
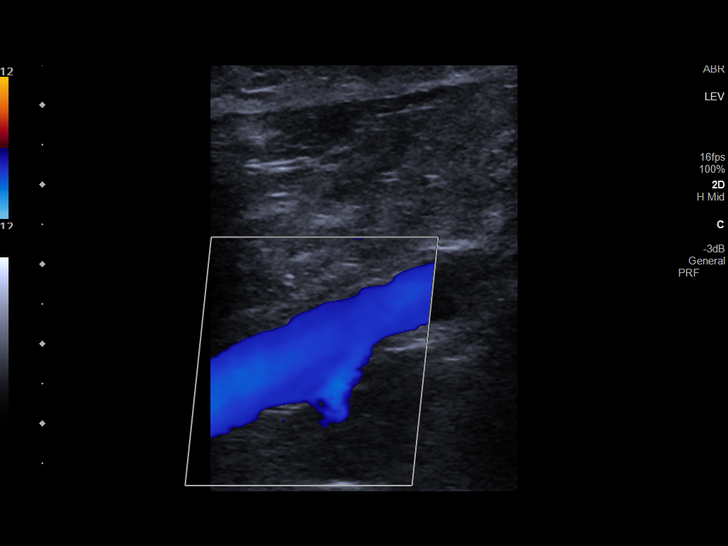
[im 30/46]
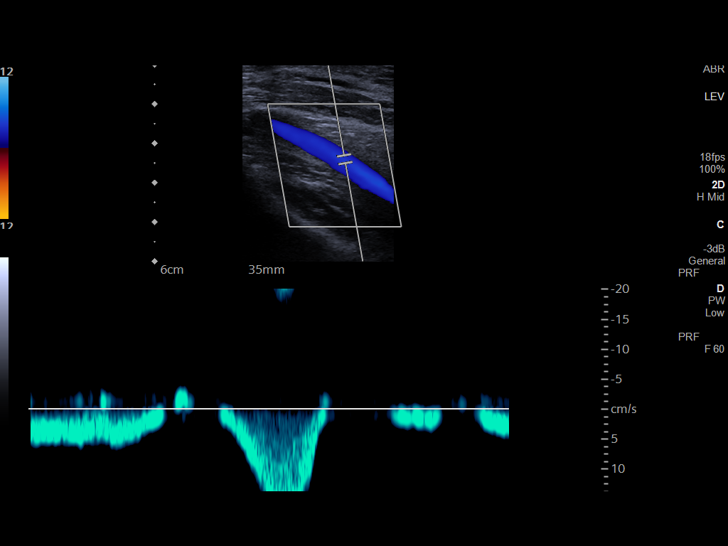
[im 34/46]
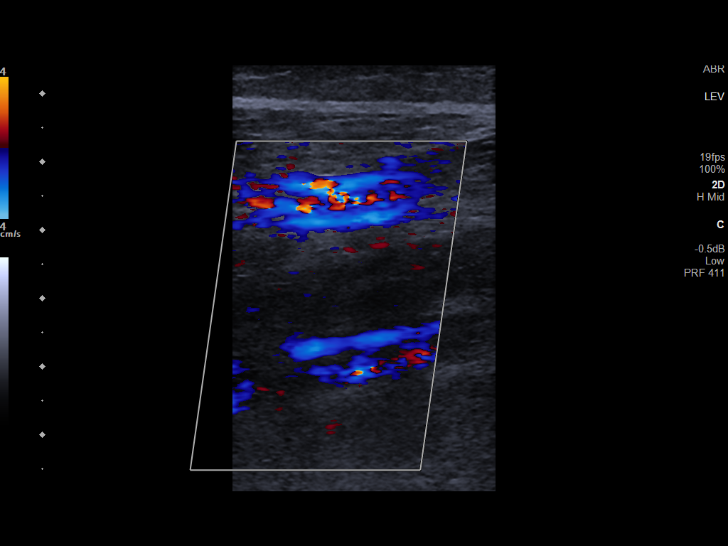
[im 38/46]
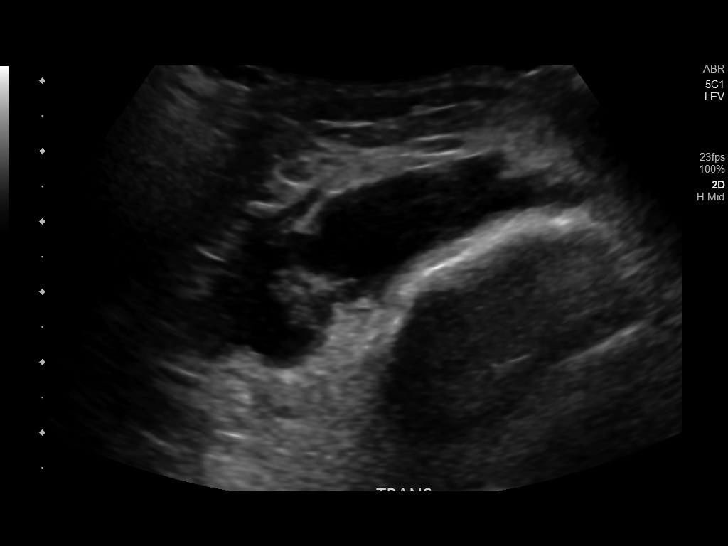
[im 42/46]
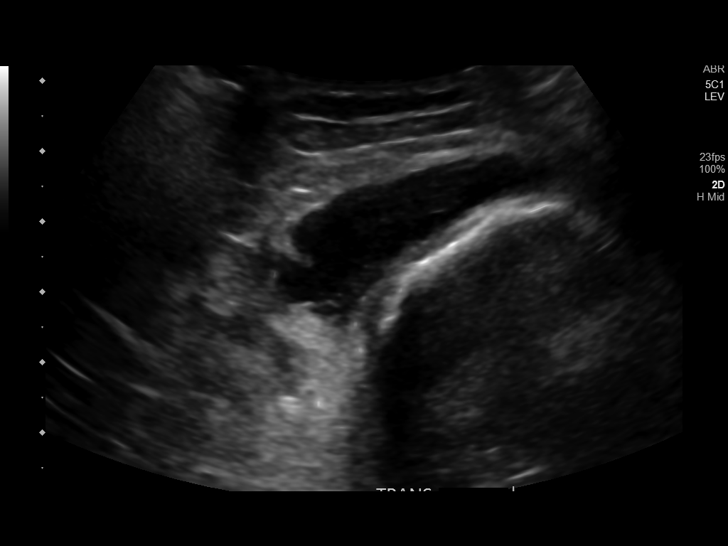
[im 46/46]
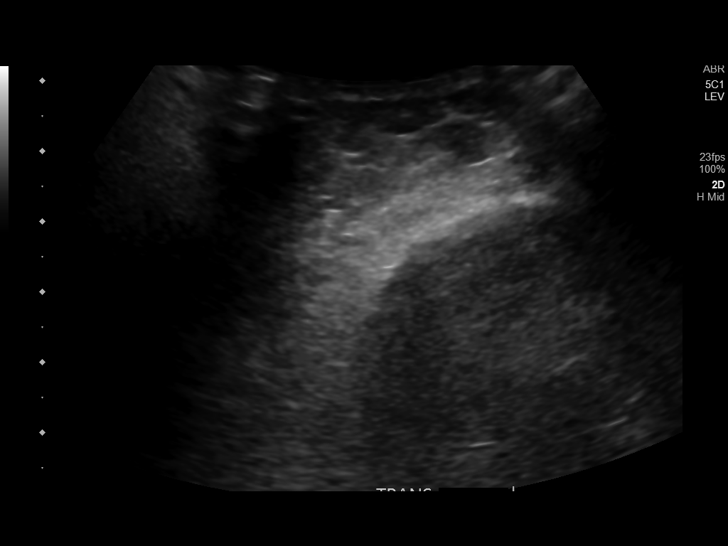

[13 of 24 positions shown; findings below may reference images not displayed]

FINDINGS: VENOUS

Normal compressibility of the common femoral, superficial femoral,
and popliteal veins, as well as the visualized calf veins.
Visualized portions of profunda femoral vein and great saphenous
vein unremarkable. No filling defects to suggest DVT on grayscale or
color Doppler imaging. Doppler waveforms show normal direction of
venous flow, normal respiratory plasticity and response to
augmentation.

Limited views of the contralateral common femoral vein are
unremarkable.

OTHER

Limited sonography in the area of palpable abnormality involving the
medial left calf demonstrates a 5.2 x 1.7 x 5.7 cm complex
subcutaneous fluid collection containing internal debris and septa
possibly representing a subcutaneous hematoma in this recently
traumatized patient. There is no significant internal vascularity or
surrounding hyperemia.

Limitations: none
IMPRESSION: No femoropopliteal DVT within the left lower extremity.

5.7 cm complex fluid collection within the subcutaneous soft tissues
of the medial left calf possibly representing a subcutaneous
hematoma.

## 2021-10-10 ENCOUNTER — Ambulatory Visit: Payer: Medicare Other | Attending: Anesthesiology | Admitting: Anesthesiology

## 2021-10-10 ENCOUNTER — Other Ambulatory Visit: Payer: Self-pay

## 2021-10-10 ENCOUNTER — Encounter: Payer: Self-pay | Admitting: Anesthesiology

## 2021-10-10 DIAGNOSIS — M47812 Spondylosis without myelopathy or radiculopathy, cervical region: Secondary | ICD-10-CM

## 2021-10-10 DIAGNOSIS — M5441 Lumbago with sciatica, right side: Secondary | ICD-10-CM | POA: Diagnosis not present

## 2021-10-10 DIAGNOSIS — M47816 Spondylosis without myelopathy or radiculopathy, lumbar region: Secondary | ICD-10-CM | POA: Diagnosis not present

## 2021-10-10 DIAGNOSIS — M5442 Lumbago with sciatica, left side: Secondary | ICD-10-CM | POA: Diagnosis not present

## 2021-10-10 DIAGNOSIS — G894 Chronic pain syndrome: Secondary | ICD-10-CM

## 2021-10-10 DIAGNOSIS — G8929 Other chronic pain: Secondary | ICD-10-CM

## 2021-10-10 DIAGNOSIS — F1190 Opioid use, unspecified, uncomplicated: Secondary | ICD-10-CM | POA: Diagnosis not present

## 2021-10-10 DIAGNOSIS — M5136 Other intervertebral disc degeneration, lumbar region: Secondary | ICD-10-CM

## 2021-10-10 MED ORDER — HYDROCODONE-ACETAMINOPHEN 5-325 MG PO TABS: 1.0000 | ORAL_TABLET | Freq: Four times a day (QID) | ORAL | 0 refills | Status: DC | PRN

## 2021-10-10 MED ORDER — HYDROCODONE-ACETAMINOPHEN 5-325 MG PO TABS: 1.0000 | ORAL_TABLET | Freq: Four times a day (QID) | ORAL | 0 refills | Status: AC | PRN

## 2021-10-10 NOTE — Progress Notes (Signed)
Virtual Visit via Telephone Note  I connected with Debra Hogan on 10/10/21 at  3:20 PM EDT by telephone and verified that I am speaking with the correct person using two identifiers.  Location: Patient: Home Provider: Pain control center   I discussed the limitations, risks, security and privacy concerns of performing an evaluation and management service by telephone and the availability of in person appointments. I also discussed with the patient that there Bin be a patient responsible charge related to this service. The patient expressed understanding and agreed to proceed.   History of Present Illness:  I spoke with Debra Hogan today via telephone as we were unable to link for the video portion of the virtual conference but she reports that she is still having a fair amount of thoracic and lumbar pain.  Fortunately the severe spasming in the thoracic region has abated.  Also she reports that the lancinating pain and radicular type pain down the legs also has quieted down a fair amount.  She is taking Flexeril nightly to help with this pain and recently ran out of her hydrocodone.  When she was using it she was getting good relief and no side effects were reported with it.  Otherwise she is trying to do her stretching exercises and applies a TENS unit on a regular basis to try and help.  She has not been doing any foam rolling.  She is try to ambulate as tolerated but this is limited.  No change in lower extremity strength or function or bowel or bladder function is noted at this time.  She does well with the opioids with no side effects and continues to derive good functional lifestyle improvement per her report to date.  Review of systems: General: No fevers or chills Pulmonary: No shortness of breath or dyspnea Cardiac: No angina or palpitations or lightheadedness GI: No abdominal pain or constipation Psych: No depression  Observations/Objective:   Current Outpatient Medications:     HYDROcodone-acetaminophen (NORCO/VICODIN) 5-325 MG tablet, Take 1 tablet by mouth every 6 (six) hours as needed for moderate pain or severe pain., Disp: 120 tablet, Rfl: 0   [START ON 11/09/2021] HYDROcodone-acetaminophen (NORCO/VICODIN) 5-325 MG tablet, Take 1 tablet by mouth every 6 (six) hours as needed for moderate pain or severe pain., Disp: 120 tablet, Rfl: 0   acyclovir (ZOVIRAX) 400 MG tablet, Take 1 tablet (400 mg total) by mouth daily., Disp: 90 tablet, Rfl: 3   Albuterol Sulfate (PROAIR RESPICLICK) 108 (90 Base) MCG/ACT AEPB, Inhale 1 puff into the lungs every 6 (six) hours as needed (as needed for cough, wheezing)., Disp: 1 each, Rfl: 0   ALPRAZolam (XANAX) 0.25 MG tablet, Take 1 tablet (0.25 mg total) by mouth 2 (two) times daily as needed for anxiety., Disp: 20 tablet, Rfl: 1   aspirin EC 81 MG tablet, Take 81 mg by mouth daily., Disp: , Rfl:    cholecalciferol (VITAMIN D) 1000 units tablet, Take 1,000 Units by mouth daily., Disp: , Rfl:    diclofenac sodium (VOLTAREN) 1 % GEL, Apply 4 g topically 4 (four) times daily., Disp: 1 Tube, Rfl: 3   diltiazem (CARDIZEM CD) 180 MG 24 hr capsule, TAKE 1 CAPSULE BY MOUTH ONCE DAILY (PLEASE  SCHEDULE  OFFICE  VISIT  FOR  FURTHER  REFILLS), Disp: 30 capsule, Rfl: 0   escitalopram (LEXAPRO) 20 MG tablet, Take 1 tablet (20 mg total) by mouth daily., Disp: 90 tablet, Rfl: 0   fluticasone-salmeterol (ADVAIR DISKUS) 250-50 MCG/ACT AEPB,  Inhale 1 puff into the lungs in the morning and at bedtime., Disp: 60 each, Rfl: 1   gabapentin (NEURONTIN) 100 MG capsule, Take 1 capsule (100 mg total) by mouth 3 (three) times daily., Disp: 180 capsule, Rfl: 2   levothyroxine (SYNTHROID) 50 MCG tablet, Take 1 tablet (50 mcg total) by mouth daily before breakfast. Take one tablet daily before breakfast on empty stomach., Disp: 90 tablet, Rfl: 1   lisinopril (ZESTRIL) 20 MG tablet, Take 1 tablet (20 mg total) by mouth daily., Disp: 90 tablet, Rfl: 3   Multiple  Vitamins-Minerals (CENTRUM SILVER ULTRA WOMENS) TABS, Take 1 tablet by mouth daily., Disp: , Rfl:    nitroGLYCERIN (NITROSTAT) 0.4 MG SL tablet, Place 1 tablet (0.4 mg total) under the tongue every 5 (five) minutes as needed for chest pain., Disp: 25 tablet, Rfl: 3   ondansetron (ZOFRAN-ODT) 4 MG disintegrating tablet, Take 1 tablet (4 mg total) by mouth every 6 (six) hours as needed for nausea or vomiting., Disp: 20 tablet, Rfl: 2   potassium chloride (KLOR-CON) 10 MEQ tablet, Take 1 tablet (10 mEq total) by mouth daily., Disp: 90 tablet, Rfl: 1   rosuvastatin (CRESTOR) 20 MG tablet, Take 1 tablet (20 mg total) by mouth daily., Disp: 90 tablet, Rfl: 0  Assessment and Plan: 1. DDD (degenerative disc disease), lumbar   2. Chronic bilateral low back pain with bilateral sciatica   3. Facet arthropathy, cervical   4. Chronic, continuous use of opioids   5. Chronic pain syndrome   6. Lumbar facet joint syndrome   Based on our discussion today and after review of the [facility] practitioner database information I think it is appropriate for refills for her opiates.  She continues to get good relief with these and no side effects are reported.  She takes them as prescribed with no evidence of any diverting or illicit use.  These will be dated for today in a month from today.  I have counseled her that she [place] be a candidate for repeat epidural if she continues to have any radicular symptoms but these seem to be better at present.  I encouraged her to continue with stretching strengthening exercises with application of the TENS unit and her muscle relaxants.  She is to continue follow-up with her primary care physician for baseline medical care with a 91-month return to clinic follow-up  Follow Up Instructions:    I discussed the assessment and treatment plan with the patient. The patient was provided an opportunity to ask questions and all were answered. The patient agreed with the plan and demonstrated  an understanding of the instructions.   The patient was advised to call back or seek an in-person evaluation if the symptoms worsen or if the condition fails to improve as anticipated.  I provided 30 minutes of non-face-to-face time during this encounter.   Yevette Edwards, MD

## 2021-11-14 ENCOUNTER — Other Ambulatory Visit: Payer: Self-pay | Admitting: Family

## 2021-11-14 DIAGNOSIS — A6004 Herpesviral vulvovaginitis: Secondary | ICD-10-CM

## 2021-11-21 ENCOUNTER — Other Ambulatory Visit: Payer: Self-pay | Admitting: Family

## 2021-11-21 DIAGNOSIS — R000 Tachycardia, unspecified: Secondary | ICD-10-CM

## 2021-11-24 ENCOUNTER — Telehealth: Payer: Self-pay | Admitting: Family

## 2021-11-24 ENCOUNTER — Other Ambulatory Visit: Payer: Self-pay

## 2021-11-24 DIAGNOSIS — E038 Other specified hypothyroidism: Secondary | ICD-10-CM

## 2021-11-24 DIAGNOSIS — A6004 Herpesviral vulvovaginitis: Secondary | ICD-10-CM

## 2021-11-24 DIAGNOSIS — F411 Generalized anxiety disorder: Secondary | ICD-10-CM

## 2021-11-24 DIAGNOSIS — R000 Tachycardia, unspecified: Secondary | ICD-10-CM

## 2021-11-24 MED ORDER — DILTIAZEM HCL ER COATED BEADS 180 MG PO CP24: ORAL_CAPSULE | ORAL | 0 refills | Status: DC

## 2021-11-24 MED ORDER — FLUTICASONE-SALMETEROL 250-50 MCG/ACT IN AEPB: 1.0000 | INHALATION_SPRAY | Freq: Two times a day (BID) | RESPIRATORY_TRACT | 1 refills | Status: DC

## 2021-11-24 MED ORDER — LISINOPRIL 20 MG PO TABS: 20.0000 mg | ORAL_TABLET | Freq: Every day | ORAL | 3 refills | Status: DC

## 2021-11-24 MED ORDER — LEVOTHYROXINE SODIUM 50 MCG PO TABS: 50.0000 ug | ORAL_TABLET | Freq: Every day | ORAL | 1 refills | Status: DC

## 2021-11-24 MED ORDER — ROSUVASTATIN CALCIUM 20 MG PO TABS: 20.0000 mg | ORAL_TABLET | Freq: Every day | ORAL | 0 refills | Status: DC

## 2021-11-24 MED ORDER — ACYCLOVIR 400 MG PO TABS: 400.0000 mg | ORAL_TABLET | Freq: Every day | ORAL | 0 refills | Status: DC

## 2021-11-24 MED ORDER — ESCITALOPRAM OXALATE 20 MG PO TABS: 20.0000 mg | ORAL_TABLET | Freq: Every day | ORAL | 0 refills | Status: DC

## 2021-11-24 NOTE — Telephone Encounter (Signed)
Pt husband called in stating that Pt is having pain on right side and thyroids are hurting. Pt requested to see NP Arnett for an app. Advise Pt that she needs to see someone sooner then NP Arnett first app. Try to get Pt triage and Pt refused to be transfer to access nurse for triage. Advise Pt to go to urgent care or to see another provider at location. Pt refused to go to urgent care. Pt insisted to be schedule with NP Arnett. Advise Pt of first available appt. Convince pt to see another provider at our location. Pt is schedule with NP Flinchum on 11/30/2021 at 9:30am.

## 2021-11-24 NOTE — Telephone Encounter (Signed)
Just FYI. I called to triage bc I was unsure what was gong on by message. Pt is having stabbing, intermittent pain in her right hip for about 4 months now. Then the joint itself hurts as well. Pt suspects arthritis. She stated that she had spoken with Claris Che in the past about this pain, but isn't going away & is becoming unbearable. I advised patient that she could go to [facility] walk-in or a regular UC if pain worsened or she thought ortho related. She refused this however. I told her if pain worsening or she developed fever, nausea and/or vomiting that she needed to be seen.

## 2021-11-30 ENCOUNTER — Ambulatory Visit: Payer: Medicare Other | Admitting: Adult Health

## 2021-12-01 ENCOUNTER — Ambulatory Visit: Payer: Medicare Other | Admitting: Adult Health

## 2022-01-09 ENCOUNTER — Ambulatory Visit: Payer: Medicare Other | Admitting: Anesthesiology

## 2022-01-09 ENCOUNTER — Other Ambulatory Visit: Payer: Self-pay

## 2022-01-09 MED ORDER — HYDROCODONE-ACETAMINOPHEN 5-325 MG PO TABS: 1.0000 | ORAL_TABLET | Freq: Four times a day (QID) | ORAL | 0 refills | Status: AC | PRN

## 2022-01-12 ENCOUNTER — Ambulatory Visit: Payer: Medicare Other | Attending: Anesthesiology | Admitting: Anesthesiology

## 2022-01-12 ENCOUNTER — Encounter: Payer: Self-pay | Admitting: Anesthesiology

## 2022-01-12 ENCOUNTER — Other Ambulatory Visit: Payer: Self-pay

## 2022-01-12 DIAGNOSIS — M47816 Spondylosis without myelopathy or radiculopathy, lumbar region: Secondary | ICD-10-CM

## 2022-01-12 DIAGNOSIS — F1190 Opioid use, unspecified, uncomplicated: Secondary | ICD-10-CM | POA: Diagnosis not present

## 2022-01-12 DIAGNOSIS — M47812 Spondylosis without myelopathy or radiculopathy, cervical region: Secondary | ICD-10-CM

## 2022-01-12 DIAGNOSIS — M5441 Lumbago with sciatica, right side: Secondary | ICD-10-CM | POA: Diagnosis not present

## 2022-01-12 DIAGNOSIS — M19012 Primary osteoarthritis, left shoulder: Secondary | ICD-10-CM | POA: Diagnosis not present

## 2022-01-12 DIAGNOSIS — G894 Chronic pain syndrome: Secondary | ICD-10-CM | POA: Diagnosis not present

## 2022-01-12 DIAGNOSIS — M5136 Other intervertebral disc degeneration, lumbar region: Secondary | ICD-10-CM

## 2022-01-12 DIAGNOSIS — M5030 Other cervical disc degeneration, unspecified cervical region: Secondary | ICD-10-CM

## 2022-01-12 DIAGNOSIS — M5442 Lumbago with sciatica, left side: Secondary | ICD-10-CM

## 2022-01-12 DIAGNOSIS — G8929 Other chronic pain: Secondary | ICD-10-CM

## 2022-01-12 DIAGNOSIS — M51369 Other intervertebral disc degeneration, lumbar region without mention of lumbar back pain or lower extremity pain: Secondary | ICD-10-CM

## 2022-01-12 HISTORY — DX: Primary osteoarthritis, left shoulder: M19.012

## 2022-01-12 MED ORDER — HYDROCODONE-ACETAMINOPHEN 5-325 MG PO TABS: 1.0000 | ORAL_TABLET | Freq: Four times a day (QID) | ORAL | 0 refills | Status: AC | PRN

## 2022-01-12 MED ORDER — CYCLOBENZAPRINE HCL 10 MG PO TABS: 10.0000 mg | ORAL_TABLET | Freq: Every day | ORAL | 2 refills | Status: AC

## 2022-01-12 NOTE — Progress Notes (Signed)
Virtual Visit via Telephone Note  I connected with Debra Hogan on 01/12/22 at 11:20 AM EST by telephone and verified that I am speaking with the correct person using two identifiers.  Location: Patient: Home Provider: Pain control center   I discussed the limitations, risks, security and privacy concerns of performing an evaluation and management service by telephone and the availability of in person appointments. I also discussed with the patient that there Yono be a patient responsible charge related to this service. The patient expressed understanding and agreed to proceed.   History of Present Illness: I spoke with Debra Hogan via telephone as she was unable to link for the video portion of the conference but reports that she has been doing well lately.  Her low back pain and leg pain has been stable.  She is taking her medications as prescribed and doing well.  She required no injections last year and has been able to stay active and she is doing her home stretch therapy and this is working for her.  Despite this she still chronic opioid therapy.  She takes her medications as prescribed and these continue to give her about 60 to 75% relief when she takes them lasting about 4 to 6 hours.  She generally has breakthrough of the same quality pain but this is well controlled with her chronic opioid therapy and she has been stable on this without side effect.  No change in the quality characteristic or distribution of her low back pain or leg pain is noted and bowel bladder function have been stable as well.  Review of systems: General: No fevers or chills Pulmonary: No shortness of breath or dyspnea Cardiac: No angina or palpitations or lightheadedness GI: No abdominal pain or constipation Psych: No depression    Observations/Objective:   Current Outpatient Medications:    cyclobenzaprine (FLEXERIL) 10 MG tablet, Take 1 tablet (10 mg total) by mouth at bedtime., Disp: 30 tablet, Rfl: 2   [START  ON 02/08/2022] HYDROcodone-acetaminophen (NORCO/VICODIN) 5-325 MG tablet, Take 1 tablet by mouth every 6 (six) hours as needed for moderate pain or severe pain., Disp: 120 tablet, Rfl: 0   acyclovir (ZOVIRAX) 400 MG tablet, Take 1 tablet (400 mg total) by mouth daily., Disp: 90 tablet, Rfl: 0   Albuterol Sulfate (PROAIR RESPICLICK) 108 (90 Base) MCG/ACT AEPB, Inhale 1 puff into the lungs every 6 (six) hours as needed (as needed for cough, wheezing)., Disp: 1 each, Rfl: 0   ALPRAZolam (XANAX) 0.25 MG tablet, Take 1 tablet (0.25 mg total) by mouth 2 (two) times daily as needed for anxiety., Disp: 20 tablet, Rfl: 1   aspirin EC 81 MG tablet, Take 81 mg by mouth daily., Disp: , Rfl:    cholecalciferol (VITAMIN D) 1000 units tablet, Take 1,000 Units by mouth daily., Disp: , Rfl:    diclofenac sodium (VOLTAREN) 1 % GEL, Apply 4 g topically 4 (four) times daily., Disp: 1 Tube, Rfl: 3   diltiazem (CARDIZEM CD) 180 MG 24 hr capsule, TAKE 1 CAPSULE BY MOUTH ONCE DAILY . APPOINTMENT REQUIRED FOR FUTURE REFILLS, Disp: 30 capsule, Rfl: 0   escitalopram (LEXAPRO) 20 MG tablet, Take 1 tablet (20 mg total) by mouth daily., Disp: 90 tablet, Rfl: 0   fluticasone-salmeterol (ADVAIR DISKUS) 250-50 MCG/ACT AEPB, Inhale 1 puff into the lungs in the morning and at bedtime., Disp: 60 each, Rfl: 1   gabapentin (NEURONTIN) 100 MG capsule, Take 1 capsule (100 mg total) by mouth 3 (three) times daily.,  Disp: 180 capsule, Rfl: 2   HYDROcodone-acetaminophen (NORCO/VICODIN) 5-325 MG tablet, Take 1 tablet by mouth every 6 (six) hours as needed for moderate pain or severe pain., Disp: 120 tablet, Rfl: 0   levothyroxine (SYNTHROID) 50 MCG tablet, Take 1 tablet (50 mcg total) by mouth daily before breakfast. Take one tablet daily before breakfast on empty stomach., Disp: 90 tablet, Rfl: 1   lisinopril (ZESTRIL) 20 MG tablet, Take 1 tablet (20 mg total) by mouth daily., Disp: 90 tablet, Rfl: 3   Multiple Vitamins-Minerals (CENTRUM SILVER  ULTRA WOMENS) TABS, Take 1 tablet by mouth daily., Disp: , Rfl:    nitroGLYCERIN (NITROSTAT) 0.4 MG SL tablet, Place 1 tablet (0.4 mg total) under the tongue every 5 (five) minutes as needed for chest pain., Disp: 25 tablet, Rfl: 3   ondansetron (ZOFRAN-ODT) 4 MG disintegrating tablet, Take 1 tablet (4 mg total) by mouth every 6 (six) hours as needed for nausea or vomiting., Disp: 20 tablet, Rfl: 2   potassium chloride (KLOR-CON) 10 MEQ tablet, Take 1 tablet (10 mEq total) by mouth daily., Disp: 90 tablet, Rfl: 1   rosuvastatin (CRESTOR) 20 MG tablet, Take 1 tablet (20 mg total) by mouth daily., Disp: 90 tablet, Rfl: 0   Past Medical History:  Diagnosis Date   Anginal pain (HCC)    Arthritis    COPD (chronic obstructive pulmonary disease) (HCC)    Depression    Dyspnea    Fibromyalgia    Headache    Heat stroke 2006   Irritable bowel syndrome    MS (multiple sclerosis) (HCC)    Followed by Dr. Sherryll Burger   Osteoarthritis of left shoulder 01/12/2022    Assessment and Plan:  1. DDD (degenerative disc disease), lumbar   2. Chronic bilateral low back pain with bilateral sciatica   3. Facet arthropathy, cervical   4. Chronic, continuous use of opioids   5. Chronic pain syndrome   6. Lumbar facet joint syndrome   7. DDD (degenerative disc disease), cervical   8. Primary osteoarthritis of left shoulder   I feel like Debra Hogan is doing quite well with her current therapy.  No side effects are reported she has been stable with this regimen.  Refills will be dated for January 16 and February 15.  I want her to continue stretching strengthening exercises and we will schedule her for 42-month return to clinic.  Continue follow-up with her primary care physicians for baseline medical care.  I do not feel like any interventional therapy is indicated at this time. Follow Up Instructions:    I discussed the assessment and treatment plan with the patient. The patient was provided an opportunity to ask  questions and all were answered. The patient agreed with the plan and demonstrated an understanding of the instructions.   The patient was advised to call back or seek an in-person evaluation if the symptoms worsen or if the condition fails to improve as anticipated.  I provided 30 minutes of non-face-to-face time during this encounter.   Yevette Edwards, MD

## 2022-01-27 ENCOUNTER — Encounter: Payer: Medicare Other | Admitting: Family

## 2022-01-30 ENCOUNTER — Telehealth: Payer: Self-pay | Admitting: Acute Care

## 2022-01-30 NOTE — Telephone Encounter (Signed)
Left voicemail and call back number to schedule annual LDCt

## 2022-02-03 DIAGNOSIS — J449 Chronic obstructive pulmonary disease, unspecified: Secondary | ICD-10-CM | POA: Diagnosis not present

## 2022-02-21 ENCOUNTER — Encounter: Payer: Medicare Other | Admitting: Family

## 2022-03-03 ENCOUNTER — Telehealth: Payer: Self-pay | Admitting: Family

## 2022-03-03 ENCOUNTER — Encounter: Payer: Medicare Other | Admitting: Family

## 2022-03-03 ENCOUNTER — Other Ambulatory Visit: Payer: Self-pay | Admitting: Family

## 2022-03-03 DIAGNOSIS — F419 Anxiety disorder, unspecified: Secondary | ICD-10-CM

## 2022-03-03 DIAGNOSIS — F32A Depression, unspecified: Secondary | ICD-10-CM

## 2022-03-03 DIAGNOSIS — J449 Chronic obstructive pulmonary disease, unspecified: Secondary | ICD-10-CM | POA: Diagnosis not present

## 2022-03-03 NOTE — Telephone Encounter (Signed)
I haven't worked with this patient, routing to CMA for PCP ?

## 2022-03-03 NOTE — Telephone Encounter (Signed)
Pt called in requesting to speak with pharmacist. Pt stated that she needs to speak with someone about her medication. Pt stated that the pt pharmacy advise her to called her pcp office to speak with someone. Pt requesting callback  ?

## 2022-03-04 NOTE — Progress Notes (Signed)
This encounter was created in error - please disregard.

## 2022-03-09 ENCOUNTER — Telehealth: Payer: Self-pay

## 2022-03-09 NOTE — Telephone Encounter (Signed)
LMTCB to office to see what concerns patient has regarding medications ?

## 2022-03-09 NOTE — Telephone Encounter (Signed)
LMTCB to office to see what concerns patient has regarding medications ?

## 2022-03-13 ENCOUNTER — Other Ambulatory Visit: Payer: Self-pay | Admitting: Family

## 2022-03-13 ENCOUNTER — Encounter: Payer: Self-pay | Admitting: Family

## 2022-03-13 DIAGNOSIS — G8929 Other chronic pain: Secondary | ICD-10-CM

## 2022-03-13 DIAGNOSIS — R000 Tachycardia, unspecified: Secondary | ICD-10-CM

## 2022-03-15 ENCOUNTER — Other Ambulatory Visit: Payer: Self-pay

## 2022-03-15 ENCOUNTER — Encounter: Payer: Self-pay | Admitting: Anesthesiology

## 2022-03-15 ENCOUNTER — Ambulatory Visit: Payer: Medicare Other | Attending: Anesthesiology | Admitting: Anesthesiology

## 2022-03-15 VITALS — BP 148/94 | HR 105 | Temp 97.1°F | Resp 16 | Ht 65.0 in | Wt 165.0 lb

## 2022-03-15 DIAGNOSIS — F1190 Opioid use, unspecified, uncomplicated: Secondary | ICD-10-CM | POA: Insufficient documentation

## 2022-03-15 DIAGNOSIS — M5030 Other cervical disc degeneration, unspecified cervical region: Secondary | ICD-10-CM | POA: Insufficient documentation

## 2022-03-15 DIAGNOSIS — G8929 Other chronic pain: Secondary | ICD-10-CM | POA: Diagnosis not present

## 2022-03-15 DIAGNOSIS — M19012 Primary osteoarthritis, left shoulder: Secondary | ICD-10-CM | POA: Diagnosis not present

## 2022-03-15 DIAGNOSIS — G894 Chronic pain syndrome: Secondary | ICD-10-CM | POA: Diagnosis not present

## 2022-03-15 DIAGNOSIS — M5442 Lumbago with sciatica, left side: Secondary | ICD-10-CM | POA: Insufficient documentation

## 2022-03-15 DIAGNOSIS — M5441 Lumbago with sciatica, right side: Secondary | ICD-10-CM | POA: Insufficient documentation

## 2022-03-15 DIAGNOSIS — M47816 Spondylosis without myelopathy or radiculopathy, lumbar region: Secondary | ICD-10-CM | POA: Diagnosis not present

## 2022-03-15 DIAGNOSIS — M5136 Other intervertebral disc degeneration, lumbar region: Secondary | ICD-10-CM | POA: Diagnosis not present

## 2022-03-15 DIAGNOSIS — M47812 Spondylosis without myelopathy or radiculopathy, cervical region: Secondary | ICD-10-CM | POA: Insufficient documentation

## 2022-03-15 MED ORDER — HYDROCODONE-ACETAMINOPHEN 5-325 MG PO TABS: 1.0000 | ORAL_TABLET | Freq: Four times a day (QID) | ORAL | 0 refills | Status: DC | PRN

## 2022-03-15 MED ORDER — HYDROCODONE-ACETAMINOPHEN 5-325 MG PO TABS: 1.0000 | ORAL_TABLET | Freq: Four times a day (QID) | ORAL | 0 refills | Status: AC | PRN

## 2022-03-15 NOTE — Patient Instructions (Signed)
?______________________________________________________________________________________________ ? ?Specialty Pain Scale ? ?Introduction:  ?There are significant differences in how pain is reported. The word pain usually refers to physical pain, but it is also a common synonym of suffering. The medical community uses a scale from 0 (zero) to 10 (ten) to report pain level. Zero (0) is described as "no pain", while ten (10) is described as "the worse pain you can imagine". The problem with this scale is that physical pain is reported along with suffering. Suffering refers to mental pain, or more often yet it refers to any unpleasant feeling, emotion or aversion associated with the perception of harm or threat of harm. It is the psychological component of pain. ? Pain Specialists prefer to separate the two components. The pain scale used by this practice is the Verbal Numerical Rating Scale (VNRS-11). This scale is for the physical pain only. DO NOT INCLUDE how your pain psychologically affects you. This scale is for adults 66 years of age and older. It has 11 (eleven) levels. The 1st level is 0/10. This means: "right now, I have no pain". In the context of pain management, it also means: "right now, my physical pain is under control with the current therapy". ? ?General Information:  ?The scale should reflect your current level of pain. Unless you are specifically asked for the level of your worst pain, or your average pain. If you are asked for one of these two, then it should be understood that it is over the past 24 hours. ? Levels 1 (one) through 5 (five) are described below, and can be treated as an outpatient. Ambulatory pain management facilities such as ours are more than adequate to treat these levels. ?Levels 6 (six) through 10 (ten) are also described below, however, these must be treated as a hospitalized patient. While levels 6 (six) and 7 (seven) [place] be evaluated at an urgent care facility, levels 8  (eight) through 10 (ten) constitute medical emergencies and as such, they belong in a hospital's emergency department. When having these levels (as described below), do not come to our office. Our facility is not equipped to manage these levels. Go directly to an urgent care facility or an emergency department to be evaluated. ? ?Definitions:  ?Activities of Daily Living (ADL): Activities of daily living (ADL or ADLs) is a term used in healthcare to refer to people's daily self-care activities. Health professionals often use a person's ability or inability to perform ADLs as a measurement of their functional status, particularly in regard to people post injury, with disabilities and the elderly. There are two ADL levels: Basic and Instrumental. ?Basic Activities of Daily Living (BADL  or BADLs) consist of self-care tasks that include: Bathing and showering; personal hygiene and grooming (including brushing/combing/styling hair); dressing; Toilet hygiene (getting to the toilet, cleaning oneself, and getting back up); eating and self-feeding (not including cooking or chewing and swallowing); functional mobility, often referred to as "transferring", as measured by the ability to walk, get in and out of bed, and get into and out of a chair; the broader definition (moving from one place to another while performing activities) is useful for people with different physical abilities who are still able to get around independently. ?Basic ADLs include the things many people do when they get up in the morning and get ready to go out of the house: get out of bed, go to the toilet, bathe, dress, groom, and eat. On the average, loss of function typically follows a particular order.  Hygiene is the first to go, followed by loss of toilet use and locomotion. The last to go is the ability to eat. When there is only one remaining area in which the person is independent, there is a 62.9% chance that it is eating and only a 3.5% chance  that it is hygiene. ?Instrumental Activities of Daily Living (IADL or IADLs) are not necessary for fundamental functioning, but they let an individual live independently in a community. IADL consist of tasks that include: cleaning and maintaining the house; home establishment and maintenance; care of others (including selecting and supervising caregivers); care of pets; child rearing; managing money; managing financials (investments, etc.); meal preparation and cleanup; shopping for groceries and necessities; moving within the community; safety procedures and emergency responses; health management and maintenance (taking prescribed medications); and using the telephone or other form of communication. ? ?Instructions:  ?Most patients tend to report their pain as a combination of two factors, their physical pain and their psychosocial pain. This last one is also known as ?suffering? and it is reflection of how physical pain affects you socially and psychologically. From now on, report them separately.  ?From this point on, when asked to report your pain level, report only your physical pain. Use the following table for reference. ? ?Pain Clinic Pain Levels (0-5/10)  ?Pain Level Score  Description  ?No Pain 0   ?Mild pain 1 Nagging, annoying, but does not interfere with basic activities of daily living (ADL). Patients are able to eat, bathe, get dressed, toileting (being able to get on and off the toilet and perform personal hygiene functions), transfer (move in and out of bed or a chair without assistance), and maintain continence (able to control bladder and bowel functions). Blood pressure and heart rate are unaffected. A normal heart rate for a healthy adult ranges from 60 to 100 bpm (beats per minute). ?  ?Mild to moderate pain 2 Noticeable and distracting. Impossible to hide from other people. More frequent flare-ups. Still possible to adapt and function close to normal. It can be very annoying and [place] have  occasional stronger flare-ups. With discipline, patients [place] get used to it and adapt. ?  ?Moderate pain 3 Interferes significantly with activities of daily living (ADL). It becomes difficult to feed, bathe, get dressed, get on and off the toilet or to perform personal hygiene functions. Difficult to get in and out of bed or a chair without assistance. Very distracting. With effort, it can be ignored when deeply involved in activities. ?  ?Moderately severe pain 4 Impossible to ignore for more than a few minutes. With effort, patients [place] still be able to manage work or participate in some social activities. Very difficult to concentrate. ?Signs of autonomic nervous system discharge are evident: dilated pupils (mydriasis); mild sweating (diaphoresis); sleep interference. Heart rate becomes elevated (>115 bpm). Diastolic blood pressure (lower number) rises above 100 mmHg. Patients find relief in laying down and not moving. ?  ?Severe pain 5 Intense and extremely unpleasant. Associated with frowning face and frequent crying. Pain overwhelms the senses.  Ability to do any activity or maintain social relationships becomes significantly limited. Conversation becomes difficult. Pacing back and forth is common, as getting into a comfortable position is nearly impossible. Pain wakes you up from deep sleep. Physical signs will be obvious: pupillary dilation; increased sweating; goosebumps; brisk reflexes; cold, clammy hands and feet; nausea, vomiting or dry heaves; loss of appetite; significant sleep disturbance with inability to fall asleep or to   remain asleep. When persistent, significant weight loss is observed due to the complete loss of appetite and sleep deprivation.  Blood pressure and heart rate becomes significantly elevated. ?Caution: If elevated blood pressure triggers a pounding headache associated with blurred vision, then the patient should immediately seek attention at an urgent or emergency care unit, as  these [place] be signs of an impending stroke. ?  ? ?Emergency Department Pain Levels (6-10/10)  ?Emergency Room Pain 6 Severely limiting. Requires emergency care and should not be seen or managed at an outpatient pai

## 2022-03-15 NOTE — Telephone Encounter (Addendum)
Spoke to patient and scheduled appointment for this month ?

## 2022-03-15 NOTE — Progress Notes (Signed)
Nursing Pain Medication Assessment:  Safety precautions to be maintained throughout the outpatient stay will include: orient to surroundings, keep bed in low position, maintain call bell within reach at all times, provide assistance with transfer out of bed and ambulation.  Medication Inspection Compliance: Debra Hogan did not comply with our request to bring her pills to be counted. She was reminded that bringing the medication bottles, even when empty, is a requirement.  Medication: None brought in. Pill/Patch Count: None available to be counted. Bottle Appearance: No container available. Did not bring bottle(s) to appointment. Filled Date: N/A Last Medication intake:  Ran out of medicine more than 48 hours ago  

## 2022-03-15 NOTE — Addendum Note (Signed)
Addended by: Landis Martins on: 03/15/2022 03:42 PM ? ? Modules accepted: Orders ? ?

## 2022-03-20 ENCOUNTER — Ambulatory Visit: Payer: Medicare Other | Admitting: Family

## 2022-03-21 ENCOUNTER — Telehealth: Payer: Medicare Other | Admitting: Family

## 2022-03-22 LAB — TOXASSURE SELECT 13 (MW), URINE

## 2022-03-29 LAB — HM HEPATITIS C SCREENING LAB: HM Hepatitis Screen: NEGATIVE

## 2022-04-03 DIAGNOSIS — J449 Chronic obstructive pulmonary disease, unspecified: Secondary | ICD-10-CM | POA: Diagnosis not present

## 2022-04-05 ENCOUNTER — Other Ambulatory Visit (HOSPITAL_COMMUNITY)
Admission: RE | Admit: 2022-04-05 | Discharge: 2022-04-05 | Disposition: A | Discharge status: Home / Self Care | Payer: Medicare Other | Source: Ambulatory Visit | Attending: Family | Admitting: Family

## 2022-04-05 ENCOUNTER — Encounter: Payer: Self-pay | Admitting: Family

## 2022-04-05 ENCOUNTER — Other Ambulatory Visit: Payer: Self-pay

## 2022-04-05 ENCOUNTER — Ambulatory Visit (INDEPENDENT_AMBULATORY_CARE_PROVIDER_SITE_OTHER): Payer: Medicare Other | Admitting: Family

## 2022-04-05 VITALS — BP 136/86 | HR 94 | Temp 97.9°F | Ht 65.0 in | Wt 159.2 lb

## 2022-04-05 DIAGNOSIS — G8929 Other chronic pain: Secondary | ICD-10-CM

## 2022-04-05 DIAGNOSIS — M5442 Lumbago with sciatica, left side: Secondary | ICD-10-CM | POA: Diagnosis not present

## 2022-04-05 DIAGNOSIS — F32A Depression, unspecified: Secondary | ICD-10-CM

## 2022-04-05 DIAGNOSIS — Z0000 Encounter for general adult medical examination without abnormal findings: Secondary | ICD-10-CM

## 2022-04-05 DIAGNOSIS — G35 Multiple sclerosis: Secondary | ICD-10-CM | POA: Diagnosis not present

## 2022-04-05 DIAGNOSIS — Z01419 Encounter for gynecological examination (general) (routine) without abnormal findings: Secondary | ICD-10-CM | POA: Diagnosis present

## 2022-04-05 DIAGNOSIS — E038 Other specified hypothyroidism: Secondary | ICD-10-CM

## 2022-04-05 DIAGNOSIS — E785 Hyperlipidemia, unspecified: Secondary | ICD-10-CM

## 2022-04-05 DIAGNOSIS — R5383 Other fatigue: Secondary | ICD-10-CM

## 2022-04-05 DIAGNOSIS — R000 Tachycardia, unspecified: Secondary | ICD-10-CM | POA: Diagnosis not present

## 2022-04-05 DIAGNOSIS — Z1151 Encounter for screening for human papillomavirus (HPV): Secondary | ICD-10-CM | POA: Insufficient documentation

## 2022-04-05 DIAGNOSIS — R7301 Impaired fasting glucose: Secondary | ICD-10-CM | POA: Diagnosis not present

## 2022-04-05 DIAGNOSIS — J449 Chronic obstructive pulmonary disease, unspecified: Secondary | ICD-10-CM | POA: Diagnosis not present

## 2022-04-05 DIAGNOSIS — I700 Atherosclerosis of aorta: Secondary | ICD-10-CM | POA: Diagnosis not present

## 2022-04-05 DIAGNOSIS — Z124 Encounter for screening for malignant neoplasm of cervix: Secondary | ICD-10-CM | POA: Diagnosis not present

## 2022-04-05 DIAGNOSIS — M5441 Lumbago with sciatica, right side: Secondary | ICD-10-CM

## 2022-04-05 DIAGNOSIS — A6004 Herpesviral vulvovaginitis: Secondary | ICD-10-CM

## 2022-04-05 DIAGNOSIS — F411 Generalized anxiety disorder: Secondary | ICD-10-CM

## 2022-04-05 DIAGNOSIS — F419 Anxiety disorder, unspecified: Secondary | ICD-10-CM

## 2022-04-05 DIAGNOSIS — I10 Essential (primary) hypertension: Secondary | ICD-10-CM | POA: Diagnosis not present

## 2022-04-05 MED ORDER — ROSUVASTATIN CALCIUM 20 MG PO TABS: 20.0000 mg | ORAL_TABLET | Freq: Every day | ORAL | 3 refills | Status: DC

## 2022-04-05 MED ORDER — DILTIAZEM HCL ER COATED BEADS 180 MG PO CP24: ORAL_CAPSULE | ORAL | 1 refills | Status: DC

## 2022-04-05 MED ORDER — GABAPENTIN 100 MG PO CAPS: 100.0000 mg | ORAL_CAPSULE | Freq: Three times a day (TID) | ORAL | 0 refills | Status: DC

## 2022-04-05 MED ORDER — ALPRAZOLAM 0.25 MG PO TABS: 0.2500 mg | ORAL_TABLET | Freq: Two times a day (BID) | ORAL | 1 refills | Status: DC | PRN

## 2022-04-05 MED ORDER — ACYCLOVIR 400 MG PO TABS: 400.0000 mg | ORAL_TABLET | Freq: Every day | ORAL | 0 refills | Status: DC

## 2022-04-05 MED ORDER — ESCITALOPRAM OXALATE 20 MG PO TABS: 20.0000 mg | ORAL_TABLET | Freq: Every day | ORAL | 1 refills | Status: DC

## 2022-04-05 MED ORDER — PROAIR RESPICLICK 108 (90 BASE) MCG/ACT IN AEPB: 1.0000 | INHALATION_SPRAY | Freq: Four times a day (QID) | RESPIRATORY_TRACT | 5 refills | Status: DC | PRN

## 2022-04-05 NOTE — Progress Notes (Signed)
? ?Subjective:  ? ? Patient ID: Debra Hogan, female    DOB: 1961-11-09, 61 y.o.   MRN: [id number] ? ?CC: Debra Hogan is a 61 y.o. female who presents today for physical exam and follow up ? ?HPI: Feels well today.  No new complaints ? ?HTN- compliant with cardizem 180mg , lisinopril 20mg . She continues to follow with Dr .  She continues to use nitro as needed and last used 3 months ago.No CP today.  ? ?She is not taking synthroid  ? ?HLD- compliant with crestor 20mg .  ? ?GAD- compliant with lexapro 20mg , xanax 0.5mg  , half tablet prn very rarely. Regimen is working well.  Requests refill of Xanax ? ?She follows with Pain management, Dr ? ?MS- she is no longer following with [facility] neurology due to drive. She declines referral for local management.  ? ?COPD - previously followed with Dr .  She is compliant with 2 L O2.  No changes to shortness of breath.  She is compliant with Advair, and  prn albuterol.  ? ? ?Colorectal Cancer Screening: due; declines at this time ?Breast Cancer Screening: Mammogram due; she declines further screening.  ?Cervical Cancer Screening: due; unable to find record of last pap smear ;abstracted 11 years ago.  No results however ?Bone Health screening/DEXA for 65+: No increased fracture risk. Defer screening at this time ? ?Lung Cancer Screening: due  ? ?      Tetanus - due ?       Pneumococcal - complete ?Labs: Screening labs today. ? ?Alcohol use:  none ?Smoking/tobacco use: smoker.   ? ? ?HISTORY:  ?Past Medical History:  ?Diagnosis Date  ? Anginal pain (HCC)   ? Arthritis   ? COPD (chronic obstructive pulmonary disease) (HCC)   ? Depression   ? Dyspnea   ? Fibromyalgia   ? Headache   ? Heat stroke 2006  ? Irritable bowel syndrome   ? MS (multiple sclerosis) (HCC)   ? Followed by Dr. Pernell Dupre  ? Osteoarthritis of left shoulder 01/12/2022  ?  ?Past Surgical History:  ?Procedure Laterality Date  ? ABDOMINAL HYSTERECTOMY    ? CERVIX intact; had for noncancerous reasons,  endometriosis  ? CHOLECYSTECTOMY N/A 01/03/2016  ? Procedure: LAPAROSCOPIC CHOLECYSTECTOMY WITH INTRAOPERATIVE CHOLANGIOGRAM;  Surgeon: 2007, MD;  Location: [facility] ORS;  Service: General;  Laterality: N/A;  ? ENDOSCOPIC RETROGRADE CHOLANGIOPANCREATOGRAPHY (ERCP) WITH PROPOFOL N/A 01/04/2016  ? Procedure: ENDOSCOPIC RETROGRADE CHOLANGIOPANCREATOGRAPHY (ERCP) WITH PROPOFOL;  Surgeon: 01/14/2022, MD;  Location: [facility] ENDOSCOPY;  Service: Endoscopy;  Laterality: N/A;  ? FOOT SURGERY  2001  ? LAPAROSCOPIC ENDOMETRIOSIS FULGURATION    ? RIGHT/LEFT HEART CATH AND CORONARY ANGIOGRAPHY Bilateral 09/29/2019  ? Procedure: RIGHT/LEFT HEART CATH AND CORONARY ANGIOGRAPHY;  Surgeon: 03/03/2016, MD;  Location: [facility] INVASIVE CV LAB;  Service: Cardiovascular;  Laterality: Bilateral;  ? ?Family History  ?Problem Relation Age of Onset  ? Hypertension Mother   ? Hyperlipidemia Mother   ? Arthritis Mother   ? Cancer Father   ?     throat  ? Heart disease Brother   ? Bipolar disorder Son   ? Schizophrenia Son   ? Cancer Maternal Grandfather   ?     liver  ? Cancer Paternal Grandmother   ?     liver  ? ?  ? ?ALLERGIES: Valium [diazepam] ? ?Current Outpatient Medications on File Prior to Visit  ?Medication Sig Dispense Refill  ? aspirin EC 81 MG tablet Take 81  mg by mouth daily.    ? cholecalciferol (VITAMIN D) 1000 units tablet Take 1,000 Units by mouth daily.    ? fluticasone-salmeterol (ADVAIR DISKUS) 250-50 MCG/ACT AEPB Inhale 1 puff into the lungs in the morning and at bedtime. 60 each 1  ? HYDROcodone-acetaminophen (NORCO/VICODIN) 5-325 MG tablet Take 1 tablet by mouth every 6 (six) hours as needed for moderate pain or severe pain. 120 tablet 0  ? [START ON 04/14/2022] HYDROcodone-acetaminophen (NORCO/VICODIN) 5-325 MG tablet Take 1 tablet by mouth every 6 (six) hours as needed for moderate pain or severe pain. 120 tablet 0  ? lisinopril (ZESTRIL) 20 MG tablet Take 1 tablet (20 mg total) by mouth daily. 90 tablet 3  ?  Multiple Vitamins-Minerals (CENTRUM SILVER ULTRA WOMENS) TABS Take 1 tablet by mouth daily.    ? ondansetron (ZOFRAN-ODT) 4 MG disintegrating tablet Take 1 tablet (4 mg total) by mouth every 6 (six) hours as needed for nausea or vomiting. 20 tablet 2  ? diclofenac sodium (VOLTAREN) 1 % GEL Apply 4 g topically 4 (four) times daily. (Patient not taking: Reported on 04/05/2022) 1 Tube 3  ? nitroGLYCERIN (NITROSTAT) 0.4 MG SL tablet Place 1 tablet (0.4 mg total) under the tongue every 5 (five) minutes as needed for chest pain. 25 tablet 3  ? ?No current facility-administered medications on file prior to visit.  ? ? ?Social History  ? ?Tobacco Use  ? Smoking status: Every Day  ?  Packs/day: 1.00  ?  Years: 43.00  ?  Pack years: 43.00  ?  Types: Cigarettes  ? Smokeless tobacco: Never  ?Vaping Use  ? Vaping Use: Never used  ?Substance Use Topics  ? Alcohol use: No  ?  Alcohol/week: 0.0 standard drinks  ? Drug use: No  ? ? ?Review of Systems  ?Constitutional:  Negative for chills, fever and unexpected weight change.  ?HENT:  Negative for congestion.   ?Respiratory:  Positive for shortness of breath (chronic, unchanged). Negative for cough.   ?Cardiovascular:  Negative for chest pain, palpitations and leg swelling.  ?Gastrointestinal:  Negative for nausea and vomiting.  ?Musculoskeletal:  Positive for back pain (chronic). Negative for arthralgias and myalgias.  ?Skin:  Negative for rash.  ?Neurological:  Negative for headaches.  ?Hematological:  Negative for adenopathy.  ?Psychiatric/Behavioral:  Negative for confusion. The patient is not nervous/anxious.   ?   ?Objective:  ?  ?BP 136/86 (BP Location: Left Arm, Patient Position: Sitting, Cuff Size: Normal)   Pulse 94   Temp 97.9 ?F (36.6 ?C) (Temporal)   Ht 5\' 5"  (1.651 m)   Wt 159 lb 3.2 oz (72.2 kg)   SpO2 99%   BMI 26.49 kg/m?  ? ?BP Readings from Last 3 Encounters:  ?04/05/22 136/86  ?03/15/22 (!) 148/94  ?07/08/21 114/68  ? ?Wt Readings from Last 3 Encounters:   ?04/05/22 159 lb 3.2 oz (72.2 kg)  ?03/15/22 165 lb (74.8 kg)  ?07/08/21 162 lb 9.6 oz (73.8 kg)  ? ? ?Physical Exam ?Vitals reviewed.  ?Constitutional:   ?   Appearance: Normal appearance. She is well-developed.  ?Eyes:  ?   Conjunctiva/sclera: Conjunctivae normal.  ?Neck:  ?   Thyroid: No thyroid mass or thyromegaly.  ?Cardiovascular:  ?   Rate and Rhythm: Normal rate and regular rhythm.  ?   Pulses: Normal pulses.  ?   Heart sounds: Normal heart sounds.  ?Pulmonary:  ?   Effort: Pulmonary effort is normal.  ?   Breath sounds: Normal breath  sounds. No wheezing, rhonchi or rales.  ?Chest:  ?Breasts: ?   Breasts are symmetrical.  ?   Right: No inverted nipple, mass, nipple discharge, skin change or tenderness.  ?   Left: No inverted nipple, mass, nipple discharge, skin change or tenderness.  ?Abdominal:  ?   General: Bowel sounds are normal. There is no distension.  ?   Palpations: Abdomen is soft. Abdomen is not rigid. There is no fluid wave or mass.  ?   Tenderness: There is no abdominal tenderness. There is no guarding or rebound.  ?Genitourinary: ?   Cervix: No cervical motion tenderness, discharge or friability.  ?   Uterus: Not enlarged, not fixed and not tender.   ?   Adnexa:     ?   Right: No mass, tenderness or fullness.      ?   Left: No mass, tenderness or fullness.    ?   Comments: Pap performed. No CMT. Unable to appreciated ovaries. ?Lymphadenopathy:  ?   Head:  ?   Right side of head: No submental, submandibular, tonsillar, preauricular, posterior auricular or occipital adenopathy.  ?   Left side of head: No submental, submandibular, tonsillar, preauricular, posterior auricular or occipital adenopathy.  ?   Cervical:  ?   Right cervical: No superficial, deep or posterior cervical adenopathy. ?   Left cervical: No superficial, deep or posterior cervical adenopathy.  ?   Upper Body:  ?   Right upper body: No pectoral adenopathy.  ?   Left upper body: No pectoral adenopathy.  ?Skin: ?   General: Skin is  warm and dry.  ?Neurological:  ?   Mental Status: She is alert.  ?Psychiatric:     ?   Speech: Speech normal.     ?   Behavior: Behavior normal.     ?   Thought Content: Thought content normal.  ? ? ?   ?Assessment & Pla

## 2022-04-05 NOTE — Assessment & Plan Note (Addendum)
Chronic, stable.  She is oxygen dependent.  She has not been following recently with pulmonology.  We agreed that as she was due for lung cancer screening program which is now under [facility] pulmonology I placed a referral not only start screening program but also to establish care for ongoing surveillance of COPD to [facility] pulmonology.  Continue Advair, and  prn albuterol.  will follow ?

## 2022-04-05 NOTE — Assessment & Plan Note (Signed)
Chronic, stable.  She is not currently following with [facility] neurology.  She politely declines a local referral to neurology for surveillance, management.  She will follow back up with [facility] neurology if needed.  ?

## 2022-04-05 NOTE — Assessment & Plan Note (Addendum)
She is not currently on Synthroid. Pending TSH ?

## 2022-04-05 NOTE — Assessment & Plan Note (Signed)
Chronic, stable.  Continue lexapro 20mg , xanax 0.5mg  . ?I looked up patient on [place] Controlled Substances Reporting System PMP AWARE and saw no activity that raised concern of inappropriate use.  ? ?Refilled xanax today ?

## 2022-04-05 NOTE — Assessment & Plan Note (Addendum)
Chronic, symptomatically stable.  Continue Crestor 20 mg.  Encouraged her to continue close follow-up with cardiology.  She verbalized understanding and states she will call Dr. Tyrell Antonio office ?

## 2022-04-05 NOTE — Assessment & Plan Note (Addendum)
Clinical breast exam performed today.  Pap smear obtained.  Patient declines any further screening for breast or colon cancer.  She is due for CT lung cancer screening and that referral will be placed.  Advised to have Tdap given at local pharmacy ?

## 2022-04-05 NOTE — Patient Instructions (Addendum)
Go to local pharmacy and have tetanus vaccine ( tdap) ? ?Please make follow up with Dr Kirke Corin ( cardiology)  ? ?Blood pressure slightly elevated today.  I like you to continue to monitor at home.  I would like to bring you back in 6 weeks time to repeat blood pressure sooner if blood pressure were to increase ? ? ? ?Referral to [facility] pulmonology to establish care and also for CT lung cancer screening program ?Let us know if you dont hear back within a week in regards to an appointment being scheduled.  ? ? ?Health Maintenance for Postmenopausal Women ?Menopause is a normal process in which your ability to get pregnant comes to an end. This process happens slowly over many months or years, usually between the ages of 45 and 61. Menopause is complete when you have missed your menstrual period for 12 months. ?It is important to talk with your health care provider about some of the most common conditions that affect women after menopause (postmenopausal women). These include heart disease, cancer, and bone loss (osteoporosis). Adopting a healthy lifestyle and getting preventive care can help to promote your health and wellness. The actions you take can also lower your chances of developing some of these common conditions. ?What are the signs and symptoms of menopause? ?During menopause, you Binning have the following symptoms: ?Hot flashes. These can be moderate or severe. ?Night sweats. ?Decrease in sex drive. ?Mood swings. ?Headaches. ?Tiredness (fatigue). ?Irritability. ?Memory problems. ?Problems falling asleep or staying asleep. ?Talk with your health care provider about treatment options for your symptoms. ?Do I need hormone replacement therapy? ?Hormone replacement therapy is effective in treating symptoms that are caused by menopause, such as hot flashes and night sweats. ?Hormone replacement carries certain risks, especially as you become older. If you are thinking about using estrogen or estrogen with progestin,  discuss the benefits and risks with your health care provider. ?How can I reduce my risk for heart disease and stroke? ?The risk of heart disease, heart attack, and stroke increases as you age. One of the causes Brophy be a change in the body's hormones during menopause. This can affect how your body uses dietary fats, triglycerides, and cholesterol. Heart attack and stroke are medical emergencies. There are many things that you can do to help prevent heart disease and stroke. ?Watch your blood pressure ?High blood pressure causes heart disease and increases the risk of stroke. This is more likely to develop in people who have high blood pressure readings or are overweight. ?Have your blood pressure checked: ?Every 3-5 years if you are 57-46 years of age. ?Every year if you are 56 years old or older. ?Eat a healthy diet ? ?Eat a diet that includes plenty of vegetables, fruits, low-fat dairy products, and lean protein. ?Do not eat a lot of foods that are high in solid fats, added sugars, or sodium. ?Get regular exercise ?Get regular exercise. This is one of the most important things you can do for your health. Most adults should: ?Try to exercise for at least 150 minutes each week. The exercise should increase your heart rate and make you sweat (moderate-intensity exercise). ?Try to do strengthening exercises at least twice each week. Do these in addition to the moderate-intensity exercise. ?Spend less time sitting. Even light physical activity can be beneficial. ?Other tips ?Work with your health care provider to achieve or maintain a healthy weight. ?Do not use any products that contain nicotine or tobacco. These products include cigarettes,  chewing tobacco, and vaping devices, such as e-cigarettes. If you need help quitting, ask your health care provider. ?Know your numbers. Ask your health care provider to check your cholesterol and your blood sugar (glucose). Continue to have your blood tested as directed by your  health care provider. ?Do I need screening for cancer? ?Depending on your health history and family history, you Osgood need to have cancer screenings at different stages of your life. This Kam include screening for: ?Breast cancer. ?Cervical cancer. ?Lung cancer. ?Colorectal cancer. ?What is my risk for osteoporosis? ?After menopause, you Dart be at increased risk for osteoporosis. Osteoporosis is a condition in which bone destruction happens more quickly than new bone creation. To help prevent osteoporosis or the bone fractures that can happen because of osteoporosis, you Jelinek take the following actions: ?If you are 50-71 years old, get at least 1,000 mg of calcium and at least 600 international units (IU) of vitamin D per day. ?If you are older than age 31 but younger than age 26, get at least 1,200 mg of calcium and at least 600 international units (IU) of vitamin D per day. ?If you are older than age 22, get at least 1,200 mg of calcium and at least 800 international units (IU) of vitamin D per day. ?Smoking and drinking excessive alcohol increase the risk of osteoporosis. Eat foods that are rich in calcium and vitamin D, and do weight-bearing exercises several times each week as directed by your health care provider. ?How does menopause affect my mental health? ?Depression Kunert occur at any age, but it is more common as you become older. Common symptoms of depression include: ?Feeling depressed. ?Changes in sleep patterns. ?Changes in appetite or eating patterns. ?Feeling an overall lack of motivation or enjoyment of activities that you previously enjoyed. ?Frequent crying spells. ?Talk with your health care provider if you think that you are experiencing any of these symptoms. ?General instructions ?See your health care provider for regular wellness exams and vaccines. This Haislip include: ?Scheduling regular health, dental, and eye exams. ?Getting and maintaining your vaccines. These include: ?Influenza vaccine. Get  this vaccine each year before the flu season begins. ?Pneumonia vaccine. ?Shingles vaccine. ?Tetanus, diphtheria, and pertussis (Tdap) booster vaccine. ?Your health care provider Hernandes also recommend other immunizations. ?Tell your health care provider if you have ever been abused or do not feel safe at home. ?Summary ?Menopause is a normal process in which your ability to get pregnant comes to an end. ?This condition causes hot flashes, night sweats, decreased interest in sex, mood swings, headaches, or lack of sleep. ?Treatment for this condition Eoff include hormone replacement therapy. ?Take actions to keep yourself healthy, including exercising regularly, eating a healthy diet, watching your weight, and checking your blood pressure and blood sugar levels. ?Get screened for cancer and depression. Make sure that you are up to date with all your vaccines. ?This information is not intended to replace advice given to you by your health care provider. Make sure you discuss any questions you have with your health care provider. ?Document Revised: 05/02/2021 Document Reviewed: 05/02/2021 ?Elsevier Patient Education ? 2022 Elsevier Inc. ? ? ? ?

## 2022-04-05 NOTE — Assessment & Plan Note (Signed)
Chronic, slightly elevated today.  Advised patient to spot check blood pressure at home.  Continue cardizem 180mg , lisinopril 20mg .  Close follow-up 05/17/2022 to reevaluate blood pressure ?

## 2022-04-06 LAB — COMPREHENSIVE METABOLIC PANEL
ALT: 17 U/L (ref 0–35)
AST: 25 U/L (ref 0–37)
Albumin: 4.6 g/dL (ref 3.5–5.2)
Alkaline Phosphatase: 68 U/L (ref 39–117)
BUN: 16 mg/dL (ref 6–23)
CO2: 34 mEq/L — ABNORMAL HIGH (ref 19–32)
Calcium: 9.6 mg/dL (ref 8.4–10.5)
Chloride: 98 mEq/L (ref 96–112)
Creatinine, Ser: 0.93 mg/dL (ref 0.40–1.20)
GFR: 65.84 mL/min (ref >60.00)
Glucose, Bld: 90 mg/dL (ref 70–99)
Potassium: 4.3 mEq/L (ref 3.5–5.1)
Sodium: 140 mEq/L (ref 135–145)
Total Bilirubin: 0.4 mg/dL (ref 0.2–1.2)
Total Protein: 7 g/dL (ref 6.0–8.3)

## 2022-04-06 LAB — CBC WITH DIFFERENTIAL/PLATELET
Basophils Absolute: 0 10*3/uL (ref 0.0–0.1)
Basophils Relative: 0.6 % (ref 0.0–3.0)
Eosinophils Absolute: 0.1 10*3/uL (ref 0.0–0.7)
Eosinophils Relative: 0.9 % (ref 0.0–5.0)
HCT: 40 % (ref 36.0–46.0)
Hemoglobin: 13.5 g/dL (ref 12.0–15.0)
Lymphocytes Relative: 13 % (ref 12.0–46.0)
Lymphs Abs: 1.1 10*3/uL (ref 0.7–4.0)
MCHC: 33.7 g/dL (ref 30.0–36.0)
MCV: 93.5 fl (ref 78.0–100.0)
Monocytes Absolute: 0.6 10*3/uL (ref 0.1–1.0)
Monocytes Relative: 7.1 % (ref 3.0–12.0)
Neutro Abs: 6.8 10*3/uL (ref 1.4–7.7)
Neutrophils Relative %: 78.4 % — ABNORMAL HIGH (ref 43.0–77.0)
Platelets: 211 10*3/uL (ref 150.0–400.0)
RBC: 4.28 Mil/uL (ref 3.87–5.11)
RDW: 12.9 % (ref 11.5–15.5)
WBC: 8.6 10*3/uL (ref 4.0–10.5)

## 2022-04-06 LAB — TSH: TSH: 3.55 u[IU]/mL (ref 0.35–5.50)

## 2022-04-06 LAB — LIPID PANEL
Cholesterol: 181 mg/dL (ref 0–200)
HDL: 62.1 mg/dL (ref >39.00)
NonHDL: 118.91
Total CHOL/HDL Ratio: 3
Triglycerides: 255 mg/dL — ABNORMAL HIGH (ref 0.0–149.0)
VLDL: 51 mg/dL — ABNORMAL HIGH (ref 0.0–40.0)

## 2022-04-06 LAB — HEMOGLOBIN A1C: Hgb A1c MFr Bld: 5.4 % (ref 4.6–6.5)

## 2022-04-06 LAB — LDL CHOLESTEROL, DIRECT: Direct LDL: 86 mg/dL

## 2022-04-07 ENCOUNTER — Encounter: Payer: Self-pay | Admitting: Family

## 2022-04-07 LAB — CYTOLOGY - PAP
Comment: NEGATIVE
Diagnosis: NEGATIVE
High risk HPV: NEGATIVE

## 2022-04-14 ENCOUNTER — Other Ambulatory Visit: Payer: Self-pay | Admitting: Family

## 2022-04-14 ENCOUNTER — Encounter: Payer: Self-pay | Admitting: Family

## 2022-04-14 DIAGNOSIS — G8929 Other chronic pain: Secondary | ICD-10-CM

## 2022-04-14 MED ORDER — MELOXICAM 7.5 MG PO TABS: 7.5000 mg | ORAL_TABLET | Freq: Every day | ORAL | 1 refills | Status: DC | PRN

## 2022-04-17 ENCOUNTER — Other Ambulatory Visit: Payer: Self-pay | Admitting: Family

## 2022-04-17 DIAGNOSIS — B379 Candidiasis, unspecified: Secondary | ICD-10-CM

## 2022-04-17 MED ORDER — FLUCONAZOLE 150 MG PO TABS: 150.0000 mg | ORAL_TABLET | Freq: Once | ORAL | 1 refills | Status: AC

## 2022-04-18 ENCOUNTER — Telehealth: Payer: Self-pay

## 2022-04-18 ENCOUNTER — Other Ambulatory Visit: Payer: Self-pay

## 2022-04-18 DIAGNOSIS — I10 Essential (primary) hypertension: Secondary | ICD-10-CM

## 2022-04-18 NOTE — Telephone Encounter (Signed)
LMTCB office to get results ?

## 2022-05-02 ENCOUNTER — Telehealth: Payer: Self-pay | Admitting: Family

## 2022-05-02 ENCOUNTER — Other Ambulatory Visit: Payer: Self-pay | Admitting: Family

## 2022-05-02 DIAGNOSIS — J449 Chronic obstructive pulmonary disease, unspecified: Secondary | ICD-10-CM

## 2022-05-02 DIAGNOSIS — F17200 Nicotine dependence, unspecified, uncomplicated: Secondary | ICD-10-CM

## 2022-05-02 NOTE — Telephone Encounter (Signed)
Pt returning call.... Advised pt of below note... Pt understood...  ?

## 2022-05-02 NOTE — Telephone Encounter (Signed)
Call pt ?She is due for lung cancer ct screen ? ?This program is under [place] pulmonology  ?Referral  placed  ?She will receive a phone call to arrange ct chest ?Let us know if you dont hear back within a week in regards to an appointment being scheduled.  ? ? ? ?

## 2022-05-02 NOTE — Telephone Encounter (Signed)
LMTCB office to inform her that a referral has been placed to [facility]  for her CT  and they would  be contacting her because it is overdue ?

## 2022-05-02 NOTE — Progress Notes (Signed)
close

## 2022-05-03 DIAGNOSIS — J449 Chronic obstructive pulmonary disease, unspecified: Secondary | ICD-10-CM | POA: Diagnosis not present

## 2022-05-05 NOTE — Progress Notes (Signed)
Referral to Pulmnology at [facility] was placed on 04/02/22 ?

## 2022-05-08 ENCOUNTER — Telehealth: Payer: Medicare Other | Admitting: Anesthesiology

## 2022-05-10 ENCOUNTER — Encounter: Payer: Self-pay | Admitting: Anesthesiology

## 2022-05-10 ENCOUNTER — Ambulatory Visit: Payer: Medicare Other | Attending: Anesthesiology | Admitting: Anesthesiology

## 2022-05-10 DIAGNOSIS — M5136 Other intervertebral disc degeneration, lumbar region: Secondary | ICD-10-CM | POA: Diagnosis not present

## 2022-05-10 DIAGNOSIS — F1190 Opioid use, unspecified, uncomplicated: Secondary | ICD-10-CM | POA: Diagnosis not present

## 2022-05-10 DIAGNOSIS — M47816 Spondylosis without myelopathy or radiculopathy, lumbar region: Secondary | ICD-10-CM

## 2022-05-10 DIAGNOSIS — M47812 Spondylosis without myelopathy or radiculopathy, cervical region: Secondary | ICD-10-CM | POA: Diagnosis not present

## 2022-05-10 DIAGNOSIS — M5442 Lumbago with sciatica, left side: Secondary | ICD-10-CM

## 2022-05-10 DIAGNOSIS — M19012 Primary osteoarthritis, left shoulder: Secondary | ICD-10-CM | POA: Diagnosis not present

## 2022-05-10 DIAGNOSIS — G894 Chronic pain syndrome: Secondary | ICD-10-CM | POA: Diagnosis not present

## 2022-05-10 DIAGNOSIS — M5030 Other cervical disc degeneration, unspecified cervical region: Secondary | ICD-10-CM | POA: Diagnosis not present

## 2022-05-10 DIAGNOSIS — G8929 Other chronic pain: Secondary | ICD-10-CM

## 2022-05-10 DIAGNOSIS — M5441 Lumbago with sciatica, right side: Secondary | ICD-10-CM | POA: Diagnosis not present

## 2022-05-10 DIAGNOSIS — M51369 Other intervertebral disc degeneration, lumbar region without mention of lumbar back pain or lower extremity pain: Secondary | ICD-10-CM

## 2022-05-10 MED ORDER — HYDROCODONE-ACETAMINOPHEN 5-325 MG PO TABS: 1.0000 | ORAL_TABLET | Freq: Four times a day (QID) | ORAL | 0 refills | Status: DC | PRN

## 2022-05-10 MED ORDER — HYDROCODONE-ACETAMINOPHEN 5-325 MG PO TABS: 1.0000 | ORAL_TABLET | Freq: Four times a day (QID) | ORAL | 0 refills | Status: AC | PRN

## 2022-05-10 NOTE — Progress Notes (Signed)
Virtual Visit via Telephone Note ? ?I connected with Debra Hogan on 05/10/22 at  3:20 PM EDT by telephone and verified that I am speaking with the correct person using two identifiers. ? ?Location: ?Patient: Home ?Provider: Pain control center ?  ?I discussed the limitations, risks, security and privacy concerns of performing an evaluation and management service by telephone and the availability of in person appointments. I also discussed with the patient that there Fatica be a patient responsible charge related to this service. The patient expressed understanding and agreed to proceed. ? ? ?History of Present Illness: ?I spoke with Debra Hogan via telephone as we are unable link for the video portion of the conference.  She reports that her low back pain and thoracic pain have been Hogan but problematic.  No recent changes are noted.  The quality characteristic and distribution are Hogan.  She still takes her medications as prescribed and these continue to work well for her.  She is taking Vicodin 5 mg tablets 4 times a day.  She gets about 50 to 70% relief with each tablet lasting about 4 to 6 hours before she has recurrence of her baseline diffuse body pain.  Otherwise she is in her usual state of health no new recent changes. ? ?Review of systems: ?General: No fevers or chills ?Pulmonary: No shortness of breath or dyspnea ?Cardiac: No angina or palpitations or lightheadedness ?GI: No abdominal pain or constipation ?Psych: No depression  ?  ?Observations/Objective: ? ?Current Outpatient Medications:  ?  [START ON 07/01/2022] HYDROcodone-acetaminophen (NORCO/VICODIN) 5-325 MG tablet, Take 1 tablet by mouth every 6 (six) hours as needed for moderate pain or severe pain., Disp: 120 tablet, Rfl: 0 ?  acyclovir (ZOVIRAX) 400 MG tablet, Take 1 tablet (400 mg total) by mouth daily., Disp: 90 tablet, Rfl: 0 ?  Albuterol Sulfate (PROAIR RESPICLICK) 108 (90 Base) MCG/ACT AEPB, Inhale 1 puff into the lungs every 6 (six) hours  as needed (as needed for cough, wheezing)., Disp: 1 each, Rfl: 5 ?  ALPRAZolam (XANAX) 0.25 MG tablet, Take 1 tablet (0.25 mg total) by mouth 2 (two) times daily as needed. for anxiety, Disp: 60 tablet, Rfl: 1 ?  aspirin EC 81 MG tablet, Take 81 mg by mouth daily., Disp: , Rfl:  ?  cholecalciferol (VITAMIN D) 1000 units tablet, Take 1,000 Units by mouth daily., Disp: , Rfl:  ?  diclofenac sodium (VOLTAREN) 1 % GEL, Apply 4 g topically 4 (four) times daily. (Patient not taking: Reported on 04/05/2022), Disp: 1 Tube, Rfl: 3 ?  diltiazem (CARDIZEM CD) 180 MG 24 hr capsule, TAKE 1 CAPSULE BY MOUTH ONCE DAILY (APPOINTMENT  REQUIRED  FOR  FUTURE  REFILLS), Disp: 90 capsule, Rfl: 1 ?  escitalopram (LEXAPRO) 20 MG tablet, Take 1 tablet (20 mg total) by mouth daily., Disp: 90 tablet, Rfl: 1 ?  fluticasone-salmeterol (ADVAIR DISKUS) 250-50 MCG/ACT AEPB, Inhale 1 puff into the lungs in the morning and at bedtime., Disp: 60 each, Rfl: 1 ?  gabapentin (NEURONTIN) 100 MG capsule, Take 1 capsule (100 mg total) by mouth 3 (three) times daily., Disp: 270 capsule, Rfl: 0 ?  [START ON 06/01/2022] HYDROcodone-acetaminophen (NORCO/VICODIN) 5-325 MG tablet, Take 1 tablet by mouth every 6 (six) hours as needed for moderate pain or severe pain., Disp: 120 tablet, Rfl: 0 ?  lisinopril (ZESTRIL) 20 MG tablet, Take 1 tablet (20 mg total) by mouth daily., Disp: 90 tablet, Rfl: 3 ?  Multiple Vitamins-Minerals (CENTRUM SILVER ULTRA WOMENS) TABS,  Take 1 tablet by mouth daily., Disp: , Rfl:  ?  nitroGLYCERIN (NITROSTAT) 0.4 MG SL tablet, Place 1 tablet (0.4 mg total) under the tongue every 5 (five) minutes as needed for chest pain., Disp: 25 tablet, Rfl: 3 ?  ondansetron (ZOFRAN-ODT) 4 MG disintegrating tablet, Take 1 tablet (4 mg total) by mouth every 6 (six) hours as needed for nausea or vomiting., Disp: 20 tablet, Rfl: 2 ?  rosuvastatin (CRESTOR) 20 MG tablet, Take 1 tablet (20 mg total) by mouth daily., Disp: 90 tablet, Rfl: 3  ? ?Past Medical  History:  ?Diagnosis Date  ? Anginal pain (HCC)   ? Arthritis   ? COPD (chronic obstructive pulmonary disease) (HCC)   ? Depression   ? Dyspnea   ? Fibromyalgia   ? Headache   ? Heat stroke 2006  ? Irritable bowel syndrome   ? MS (multiple sclerosis) (HCC)   ? Followed by Dr. Sherryll Burger  ? Osteoarthritis of left shoulder 01/12/2022  ?  ? ?Assessment and Plan: ?1. DDD (degenerative disc disease), lumbar   ?2. Facet arthropathy, cervical   ?3. Chronic bilateral low back pain with bilateral sciatica   ?4. Chronic, continuous use of opioids   ?5. Chronic pain syndrome   ?6. Lumbar facet joint syndrome   ?7. Primary osteoarthritis of left shoulder   ?8. DDD (degenerative disc disease), cervical   ?Based on our discussion today I think it is appropriate to refill her medicines for the next 2 months dated for June 8 and July 8.  I have reviewed the [facility] practitioner database information and it is appropriate.  No other changes in her pharmacologic regimen are initiated today.  I encouraged her to continue her physical therapy exercises and aerobic conditioning with walking is much as possible.  We will schedule her for return to clinic in 2 months.  Her recent urine screen was appropriate.  Continue follow-up with her primary care physicians for baseline medical care. ? ?Follow Up Instructions: ? ?  ?I discussed the assessment and treatment plan with the patient. The patient was provided an opportunity to ask questions and all were answered. The patient agreed with the plan and demonstrated an understanding of the instructions. ?  ?The patient was advised to call back or seek an in-person evaluation if the symptoms worsen or if the condition fails to improve as anticipated. ? ?I provided 30 minutes of non-face-to-face time during this encounter. ? ? ?Yevette Edwards, MD  ?

## 2022-05-17 ENCOUNTER — Encounter: Payer: Self-pay | Admitting: Family

## 2022-05-17 ENCOUNTER — Ambulatory Visit (INDEPENDENT_AMBULATORY_CARE_PROVIDER_SITE_OTHER): Payer: Medicare Other | Admitting: Family

## 2022-05-17 DIAGNOSIS — F411 Generalized anxiety disorder: Secondary | ICD-10-CM | POA: Diagnosis not present

## 2022-05-17 DIAGNOSIS — I700 Atherosclerosis of aorta: Secondary | ICD-10-CM | POA: Diagnosis not present

## 2022-05-17 DIAGNOSIS — I10 Essential (primary) hypertension: Secondary | ICD-10-CM | POA: Diagnosis not present

## 2022-05-17 NOTE — Progress Notes (Signed)
Verbal consent for services obtained from patient prior to services given to TELEPHONE visit:   Location of call:  provider at work patient at home  Names of all persons present for services: Rennie Plowman, NP and patient Chief complaint:  Follow up No new complaints Anxiety disorder-compliant with Lexapro 20 mg, Xanax 0.25 mg BID. Taking xanax BID has been very helpful and she feels less tense, anxious.   Atherosclerosis-compliant with Crestor 20 mg Hypertension-compliant with cardizem 180mg , lisinopril 20mg . She hasnt checked blood pressure at home.   BP Readings from Last 3 Encounters:  04/05/22 136/86  03/15/22 (!) 148/94  07/08/21 114/68    A/P/next steps:  Problem List Items Addressed This Visit       Cardiovascular and Mediastinum   Atherosclerosis of aorta [facility])    Lab Results  Component Value Date   LDLCALC 91 06/18/2020  LDL < 100. Continue crestor 20mg        HTN (hypertension)    Chronic. No recent BP readings. Advised to monitor BP at home ,  Goal < 130/80 and to call me with escalations so we can adjust regimen. Continue cardizem 180mg , lisinopril 20mg          Other   Generalized anxiety disorder    Chronic, stable. Continue Lexapro 20 mg, Xanax 0.25 mg BID         I spent 15 min  discussing plan of care over the phone.

## 2022-05-17 NOTE — Assessment & Plan Note (Signed)
Chronic, stable. Continue Lexapro 20 mg, Xanax 0.25 mg BID

## 2022-05-17 NOTE — Patient Instructions (Signed)
  Monitor blood pressure at home and me 5-6 reading on separate days. Goal is less than 120/80, based on newest guidelines, however we certainly want to be less than 130/80;  if persistently higher, please make sooner follow up appointment so we can recheck you blood pressure and manage/ adjust medications.   Schedule lab appointment in the next couple of weeks

## 2022-05-17 NOTE — Assessment & Plan Note (Signed)
Lab Results  Component Value Date   LDLCALC 91 06/18/2020   LDL < 100. Continue crestor 20mg 

## 2022-05-17 NOTE — Assessment & Plan Note (Signed)
Chronic. No recent BP readings. Advised to monitor BP at home ,  Goal < 130/80 and to call me with escalations so we can adjust regimen. Continue cardizem 180mg , lisinopril 20mg 

## 2022-05-24 ENCOUNTER — Telehealth: Payer: Self-pay | Admitting: Family

## 2022-05-24 NOTE — Telephone Encounter (Signed)
Patient stated she WCB to sched AWV. Called patient to schedule Annual Wellness Visit.  Please schedule with Nurse Health Advisor Denisa O'Brien-Blaney, LPN at [facility].  Please call [phone] ask for [facility]

## 2022-05-29 ENCOUNTER — Ambulatory Visit (INDEPENDENT_AMBULATORY_CARE_PROVIDER_SITE_OTHER): Payer: Medicare Other

## 2022-05-29 VITALS — Ht 65.0 in | Wt 155.0 lb

## 2022-05-29 DIAGNOSIS — Z0000 Encounter for general adult medical examination without abnormal findings: Secondary | ICD-10-CM

## 2022-05-29 NOTE — Patient Instructions (Addendum)
  Debra Hogan , Thank you for taking time to come for your Medicare Wellness Visit. I appreciate your ongoing commitment to your health goals. Please review the following plan we discussed and let me know if I can assist you in the future.   These are the goals we discussed:  Goals       Patient Stated     Maintain Healthy Lifestyle (pt-stated)      Stay hydrated. Stay active.        This is a list of the screening recommended for you and due dates:  Health Maintenance  Topic Date Due   Colon Cancer Screening  08/25/2022*   Zoster (Shingles) Vaccine (1 of 2) 08/29/2022*   Pap Smear  04/05/2025   Tetanus Vaccine  05/13/2028   Hepatitis C Screening: USPSTF Recommendation to screen - Ages 18-79 yo.  Completed   HIV Screening  Completed   HPV Vaccine  Aged Out   Flu Shot  Discontinued   Mammogram  Discontinued   COVID-19 Vaccine  Discontinued  *Topic was postponed. The date shown is not the original due date.

## 2022-05-29 NOTE — Progress Notes (Signed)
Subjective:   Debra Hogan is a 61 y.o. female who presents for Medicare Annual (Subsequent) preventive examination.  Review of Systems    No ROS.  Medicare Wellness Virtual Visit.  Visual/audio telehealth visit, UTA vital signs.   See social history for additional risk factors.   Cardiac Risk Factors include: hypertension     Objective:    Today's Vitals   05/29/22 1416  Weight: 155 lb (70.3 kg)  Height: 5\' 5"  (1.651 m)   Body mass index is 25.79 kg/m.     05/29/2022    2:25 PM 03/15/2022    3:03 PM 05/24/2021    2:58 PM 01/16/2021   11:50 AM 04/27/2020    1:41 PM 09/29/2019   10:08 AM 05/21/2019    3:40 PM  Advanced Directives  Does Patient Have a Medical Advance Directive? No No No No No No No  Would patient like information on creating a medical advance directive? No - Patient declined  No - Patient declined No - Patient declined   Yes (MAU/Ambulatory/Procedural Areas - Information given)    Current Medications (verified) Outpatient Encounter Medications as of 05/29/2022  Medication Sig   acyclovir (ZOVIRAX) 400 MG tablet Take 1 tablet (400 mg total) by mouth daily.   Albuterol Sulfate (PROAIR RESPICLICK) 108 (90 Base) MCG/ACT AEPB Inhale 1 puff into the lungs every 6 (six) hours as needed (as needed for cough, wheezing).   ALPRAZolam (XANAX) 0.25 MG tablet Take 1 tablet (0.25 mg total) by mouth 2 (two) times daily as needed. for anxiety   aspirin EC 81 MG tablet Take 81 mg by mouth daily.   cholecalciferol (VITAMIN D) 1000 units tablet Take 1,000 Units by mouth daily.   diclofenac sodium (VOLTAREN) 1 % GEL Apply 4 g topically 4 (four) times daily.   diltiazem (CARDIZEM CD) 180 MG 24 hr capsule TAKE 1 CAPSULE BY MOUTH ONCE DAILY (APPOINTMENT  REQUIRED  FOR  FUTURE  REFILLS)   escitalopram (LEXAPRO) 20 MG tablet Take 1 tablet (20 mg total) by mouth daily.   fluticasone-salmeterol (ADVAIR DISKUS) 250-50 MCG/ACT AEPB Inhale 1 puff into the lungs in the morning and at bedtime.    gabapentin (NEURONTIN) 100 MG capsule Take 1 capsule (100 mg total) by mouth 3 (three) times daily.   [START ON 06/01/2022] HYDROcodone-acetaminophen (NORCO/VICODIN) 5-325 MG tablet Take 1 tablet by mouth every 6 (six) hours as needed for moderate pain or severe pain.   [START ON 07/01/2022] HYDROcodone-acetaminophen (NORCO/VICODIN) 5-325 MG tablet Take 1 tablet by mouth every 6 (six) hours as needed for moderate pain or severe pain.   lisinopril (ZESTRIL) 20 MG tablet Take 1 tablet (20 mg total) by mouth daily.   Multiple Vitamins-Minerals (CENTRUM SILVER ULTRA WOMENS) TABS Take 1 tablet by mouth daily.   nitroGLYCERIN (NITROSTAT) 0.4 MG SL tablet Place 1 tablet (0.4 mg total) under the tongue every 5 (five) minutes as needed for chest pain.   ondansetron (ZOFRAN-ODT) 4 MG disintegrating tablet Take 1 tablet (4 mg total) by mouth every 6 (six) hours as needed for nausea or vomiting.   rosuvastatin (CRESTOR) 20 MG tablet Take 1 tablet (20 mg total) by mouth daily.   No facility-administered encounter medications on file as of 05/29/2022.    Allergies (verified) Valium [diazepam]   History: Past Medical History:  Diagnosis Date   Anginal pain (HCC)    Arthritis    COPD (chronic obstructive pulmonary disease) (HCC)    Depression    Dyspnea  Fibromyalgia    Headache    Heat stroke 2006   Irritable bowel syndrome    MS (multiple sclerosis) (HCC)    Followed by Dr. Sherryll Burger   Osteoarthritis of left shoulder 01/12/2022   Past Surgical History:  Procedure Laterality Date   ABDOMINAL HYSTERECTOMY     CERVIX intact; had for noncancerous reasons, endometriosis   CHOLECYSTECTOMY N/A 01/03/2016   Procedure: LAPAROSCOPIC CHOLECYSTECTOMY WITH INTRAOPERATIVE CHOLANGIOGRAM;  Surgeon: Lattie Haw, MD;  Location: [facility] ORS;  Service: General;  Laterality: N/A;   ENDOSCOPIC RETROGRADE CHOLANGIOPANCREATOGRAPHY (ERCP) WITH PROPOFOL N/A 01/04/2016   Procedure: ENDOSCOPIC RETROGRADE  CHOLANGIOPANCREATOGRAPHY (ERCP) WITH PROPOFOL;  Surgeon: Midge Minium, MD;  Location: [facility] ENDOSCOPY;  Service: Endoscopy;  Laterality: N/A;   FOOT SURGERY  2001   LAPAROSCOPIC ENDOMETRIOSIS FULGURATION     RIGHT/LEFT HEART CATH AND CORONARY ANGIOGRAPHY Bilateral 09/29/2019   Procedure: RIGHT/LEFT HEART CATH AND CORONARY ANGIOGRAPHY;  Surgeon: Yvonne Kendall, MD;  Location: [facility] INVASIVE CV LAB;  Service: Cardiovascular;  Laterality: Bilateral;   Family History  Problem Relation Age of Onset   Hypertension Mother    Hyperlipidemia Mother    Arthritis Mother    Cancer Father        throat   Heart disease Brother    Bipolar disorder Son    Schizophrenia Son    Cancer Maternal Grandfather        liver   Cancer Paternal Grandmother        liver   Social History   Socioeconomic History   Marital status: Married    Spouse name: Not on file   Number of children: Not on file   Years of education: Not on file   Highest education level: Not on file  Occupational History   Not on file  Tobacco Use   Smoking status: Every Day    Packs/day: 1.00    Years: 43.00    Pack years: 43.00    Types: Cigarettes   Smokeless tobacco: Never  Vaping Use   Vaping Use: Never used  Substance and Sexual Activity   Alcohol use: No    Alcohol/week: 0.0 standard drinks   Drug use: No   Sexual activity: Not Currently    Birth control/protection: Post-menopausal  Other Topics Concern   Not on file  Social History Narrative   Lives in [place] with son. Widowed x 2.      Married for 10 years.       On disability      Social Determinants of Corporate investment banker Strain: Low Risk    Difficulty of Paying Living Expenses: Not hard at all  Food Insecurity: No Food Insecurity   Worried About Programme researcher, broadcasting/film/video in the Last Year: Never true   Barista in the Last Year: Never true  Transportation Needs: No Transportation Needs   Lack of Transportation (Medical): No   Lack of  Transportation (Non-Medical): No  Physical Activity: Insufficiently Active   Days of Exercise per Week: 3 days   Minutes of Exercise per Session: 10 min  Stress: No Stress Concern Present   Feeling of Stress : Not at all  Social Connections: Unknown   Frequency of Communication with Friends and Family: Not on file   Frequency of Social Gatherings with Friends and Family: Not on file   Attends Religious Services: Not on file   Active Member of Clubs or Organizations: Not on file   Attends Banker Meetings: Not  on file   Marital Status: Married    Tobacco Counseling Ready to quit: Not Answered Counseling given: Not Answered   Clinical Intake:  Pre-visit preparation completed: Yes           How often do you need to have someone help you when you read instructions, pamphlets, or other written materials from your doctor or pharmacy?: 1 - Never    Interpreter Needed?: No      Activities of Daily Living    05/29/2022    2:17 PM  In your present state of health, do you have any difficulty performing the following activities:  Hearing? 0  Vision? 0  Comment notes nothing that requires follow up at this time.  Walking or climbing stairs? 1  Comment Unsteady gait. Walker in rollator. Paces self.  Dressing or bathing? 0  Doing errands, shopping? 1  Comment Family Ship broker and eating ? N  Using the Toilet? N  In the past six months, have you accidently leaked urine? N  Do you have problems with loss of bowel control? N  Managing your Medications? N  Managing your Finances? Y  Comment Husband assist  Housekeeping or managing your Housekeeping? Y  Comment Husband assist    Patient Care Team: Allegra Grana, FNP as PCP - General (Family Medicine) Iran Ouch, MD as PCP - Cardiology (Cardiology)  Indicate any recent Medical Services you Schnoor have received from other than [facility] providers in the past year (date Carbone be approximate).      Assessment:   This is a routine wellness examination for Debra Hogan.  Virtual Visit via Telephone Note  I connected with  Debra Hogan on 05/29/22 at  2:15 PM EDT by telephone and verified that I am speaking with the correct person using two identifiers.  Persons participating in the virtual visit: patient/Nurse Health Advisor   I discussed the limitations of performing an evaluation and management service by telehealth. We continued and completed visit with audio only. Some vital signs Pesola be absent or patient reported.   Hearing/Vision screen Hearing Screening - Comments:: Patient is able to hear conversational tones without difficulty. No issues reported. Vision Screening - Comments:: Followed by [facility] Wears corrective lenses They have regular follow up with the ophthalmologist  Dietary issues and exercise activities discussed:   Regular diet    Goals Addressed               This Visit's Progress     Patient Stated     Maintain Healthy Lifestyle (pt-stated)        Stay hydrated. Stay active.       Depression Screen    05/29/2022    2:24 PM 04/05/2022    3:19 PM 03/15/2022    3:03 PM 05/24/2021    2:55 PM 02/09/2021    3:23 PM 07/28/2020    4:04 PM 07/26/2020    2:09 PM  PHQ 2/9 Scores  PHQ - 2 Score 0 0 0 0 0 0 0  PHQ- 9 Score 0 0         Fall Risk    04/05/2022    3:19 PM 03/15/2022    3:03 PM 03/02/2022    4:27 PM 05/24/2021    2:59 PM 02/09/2021    3:22 PM  Fall Risk   Falls in the past year?  1 0 0 1  Number falls in past yr: 1 0 0 0 0  Injury with  Fall? 1 0 0 0 1  Risk for fall due to : History of fall(s)  No Fall Risks    Follow up Falls evaluation completed  Falls evaluation completed Falls evaluation completed Falls evaluation completed    FALL RISK PREVENTION PERTAINING TO THE HOME: Home free of loose throw rugs in walkways, pet beds, electrical cords, etc? Yes  Adequate lighting in your home to reduce risk of falls? Yes   ASSISTIVE  DEVICES UTILIZED TO PREVENT FALLS: Life alert? No  Use of a cane, walker or w/c? Yes   TIMED UP AND GO: Was the test performed? No .   Cognitive Function: Patient is alert and oriented x3.  Enjoys brain health games like Bryan for cognitive stimulation.     03/25/2018    5:06 PM  MMSE - Mini Mental State Exam  Orientation to time 5  Orientation to Place 5  Registration 3  Attention/ Calculation 5  Recall 3  Language- name 2 objects 2  Language- repeat 1  Language- follow 3 step command 3  Language- read & follow direction 1  Write a sentence 1  Copy design 1  Total score 30        05/24/2021    3:03 PM 05/21/2019    3:59 PM  6CIT Screen  What Year? 0 points 0 points  What month? 0 points 0 points  What time? 0 points 0 points  Count back from 20 0 points 0 points  Months in reverse 0 points 0 points  Repeat phrase 0 points 0 points  Total Score 0 points 0 points    Immunizations Immunization History  Administered Date(s) Administered   Influenza Split 10/07/2012, 10/25/2014   Influenza Whole 10/09/2013   Influenza,inj,Quad PF,6+ Mos 10/21/2015, 01/02/2018, 10/21/2018   Pneumococcal Polysaccharide-23 01/04/2016, 05/13/2018   Tdap 10/21/2004, 05/13/2018   Shingrix Completed?: No.    Education has been provided regarding the importance of this vaccine. Patient has been advised to call insurance company to determine out of pocket expense if they have not yet received this vaccine. Advised Totman also receive vaccine at local pharmacy or Health Dept. Verbalized acceptance and understanding.  Screening Tests Health Maintenance  Topic Date Due   COLONOSCOPY (Pts 45-79yrs Insurance coverage will need to be confirmed)  08/25/2022 (Originally 09/24/2021)   Zoster Vaccines- Shingrix (1 of 2) 08/29/2022 (Originally 09/04/2009)   PAP SMEAR-Modifier  04/05/2025   TETANUS/TDAP  05/13/2028   Hepatitis C Screening  Completed   HIV Screening  Completed   HPV VACCINES  Aged Out    INFLUENZA VACCINE  Discontinued   MAMMOGRAM  Discontinued   COVID-19 Vaccine  Discontinued   Health Maintenance There are no preventive care reminders to display for this patient.  Vision Screening: Recommended annual ophthalmology exams for early detection of glaucoma and other disorders of the eye.  Dental Screening: Recommended annual dental exams for proper oral hygiene  Community Resource Referral / Chronic Care Management: CRR required this visit?  No   CCM required this visit?  No      Plan:   Keep all routine maintenance appointments.   I have personally reviewed and noted the following in the patient's chart:   Medical and social history Use of alcohol, tobacco or illicit drugs  Current medications and supplements including opioid prescriptions. Taking hydrocodone. Followed by Debra Hogan.  Functional ability and status Nutritional status Physical activity Advanced directives List of other physicians Hospitalizations, surgeries, and ER visits in previous 12 months  Vitals Screenings to include cognitive, depression, and falls Referrals and appointments  In addition, I have reviewed and discussed with patient certain preventive protocols, quality metrics, and best practice recommendations. A written personalized care plan for preventive services as well as general preventive health recommendations were provided to patient.     Ashok Pall, LPN   12/30/1094

## 2022-06-03 DIAGNOSIS — J449 Chronic obstructive pulmonary disease, unspecified: Secondary | ICD-10-CM | POA: Diagnosis not present

## 2022-06-20 ENCOUNTER — Telehealth: Payer: Self-pay | Admitting: Acute Care

## 2022-06-30 ENCOUNTER — Other Ambulatory Visit: Payer: Self-pay | Admitting: Family

## 2022-07-03 DIAGNOSIS — J449 Chronic obstructive pulmonary disease, unspecified: Secondary | ICD-10-CM | POA: Diagnosis not present

## 2022-07-10 ENCOUNTER — Telehealth: Payer: Self-pay | Admitting: Anesthesiology

## 2022-07-10 NOTE — Telephone Encounter (Signed)
Out of medications, forgot to schedule med appt with Dr. Pernell Dupre, I put her on for July 31st. She is asking if one of the other doctors can write a script.

## 2022-07-10 NOTE — Telephone Encounter (Signed)
Patient has enough meds to last until 07-31-2022.  Patient notifeid.

## 2022-07-21 ENCOUNTER — Other Ambulatory Visit: Payer: Self-pay | Admitting: Family

## 2022-07-21 NOTE — Telephone Encounter (Signed)
Refill sent to pharmacy.   

## 2022-07-24 ENCOUNTER — Ambulatory Visit: Payer: Medicare Other | Attending: Anesthesiology | Admitting: Anesthesiology

## 2022-07-24 ENCOUNTER — Encounter: Payer: Self-pay | Admitting: Anesthesiology

## 2022-07-24 DIAGNOSIS — M5441 Lumbago with sciatica, right side: Secondary | ICD-10-CM | POA: Diagnosis not present

## 2022-07-24 DIAGNOSIS — M47812 Spondylosis without myelopathy or radiculopathy, cervical region: Secondary | ICD-10-CM | POA: Diagnosis not present

## 2022-07-24 DIAGNOSIS — G894 Chronic pain syndrome: Secondary | ICD-10-CM

## 2022-07-24 DIAGNOSIS — M5136 Other intervertebral disc degeneration, lumbar region: Secondary | ICD-10-CM | POA: Diagnosis not present

## 2022-07-24 DIAGNOSIS — M47816 Spondylosis without myelopathy or radiculopathy, lumbar region: Secondary | ICD-10-CM

## 2022-07-24 DIAGNOSIS — G8929 Other chronic pain: Secondary | ICD-10-CM

## 2022-07-24 DIAGNOSIS — M51369 Other intervertebral disc degeneration, lumbar region without mention of lumbar back pain or lower extremity pain: Secondary | ICD-10-CM

## 2022-07-24 DIAGNOSIS — M19012 Primary osteoarthritis, left shoulder: Secondary | ICD-10-CM | POA: Diagnosis not present

## 2022-07-24 DIAGNOSIS — F1190 Opioid use, unspecified, uncomplicated: Secondary | ICD-10-CM

## 2022-07-24 DIAGNOSIS — M5030 Other cervical disc degeneration, unspecified cervical region: Secondary | ICD-10-CM

## 2022-07-24 DIAGNOSIS — M5442 Lumbago with sciatica, left side: Secondary | ICD-10-CM | POA: Diagnosis not present

## 2022-07-24 MED ORDER — HYDROCODONE-ACETAMINOPHEN 5-325 MG PO TABS: 1.0000 | ORAL_TABLET | Freq: Four times a day (QID) | ORAL | 0 refills | Status: AC | PRN

## 2022-07-24 NOTE — Progress Notes (Signed)
Virtual Visit via Telephone Note  I connected with Debra Hogan on 07/24/22 at 12:25 PM EDT by telephone and verified that I am speaking with the correct person using two identifiers.  Location: Patient: Home Provider: Pain control center   I discussed the limitations, risks, security and privacy concerns of performing an evaluation and management service by telephone and the availability of in person appointments. I also discussed with the patient that there Maack be a patient responsible charge related to this service. The patient expressed understanding and agreed to proceed.   History of Present Illness: I spoke to Debra Hogan via telephone as we are unable like for the video portion of the conference.  She has had some problems with her recent cough and some chest and rib pain.  She is going to see her pulmonologist within the next few days for this.  Her back pain has been chronically problematic and continues.  No change in the quality characteristic or distribution is noted.  She still trying to stay active and doing stretching exercises.  She takes her hydrocodone as prescribed and this continues to work well for her.  She gets about 50% reduction in pain lasting about 4 to 6 hours with each tablet and no side effects reported.  Overall she is doing reasonably well with her back pain and trying to stay active as best she can but is limited secondary to her COPD and chronic pain.  Otherwise she is in her usual state of health.  Review of systems: General: No fevers or chills Pulmonary: No shortness of breath or dyspnea Cardiac: No angina or palpitations or lightheadedness GI: No abdominal pain or constipation Psych: No depression    Observations/Objective:  Current Outpatient Medications:    [START ON 09/08/2022] HYDROcodone-acetaminophen (NORCO/VICODIN) 5-325 MG tablet, Take 1 tablet by mouth every 6 (six) hours as needed for moderate pain or severe pain., Disp: 120 tablet, Rfl: 0    acyclovir (ZOVIRAX) 400 MG tablet, Take 1 tablet (400 mg total) by mouth daily., Disp: 90 tablet, Rfl: 0   Albuterol Sulfate (PROAIR RESPICLICK) 108 (90 Base) MCG/ACT AEPB, Inhale 1 puff into the lungs every 6 (six) hours as needed (as needed for cough, wheezing)., Disp: 1 each, Rfl: 5   ALPRAZolam (XANAX) 0.25 MG tablet, Take 1 tablet (0.25 mg total) by mouth 2 (two) times daily as needed. for anxiety, Disp: 60 tablet, Rfl: 1   aspirin EC 81 MG tablet, Take 81 mg by mouth daily., Disp: , Rfl:    cholecalciferol (VITAMIN D) 1000 units tablet, Take 1,000 Units by mouth daily., Disp: , Rfl:    diclofenac sodium (VOLTAREN) 1 % GEL, Apply 4 g topically 4 (four) times daily., Disp: 1 Tube, Rfl: 3   diltiazem (CARDIZEM CD) 180 MG 24 hr capsule, TAKE 1 CAPSULE BY MOUTH ONCE DAILY (APPOINTMENT  REQUIRED  FOR  FUTURE  REFILLS), Disp: 90 capsule, Rfl: 1   escitalopram (LEXAPRO) 20 MG tablet, Take 1 tablet (20 mg total) by mouth daily., Disp: 90 tablet, Rfl: 1   fluticasone-salmeterol (ADVAIR) 250-50 MCG/ACT AEPB, INHALE 1 DOSE BY MOUTH TWICE DAILY (AM  AND  BEDTIME), Disp: 60 each, Rfl: 0   gabapentin (NEURONTIN) 100 MG capsule, Take 1 capsule (100 mg total) by mouth 3 (three) times daily., Disp: 270 capsule, Rfl: 0   [START ON 08/09/2022] HYDROcodone-acetaminophen (NORCO/VICODIN) 5-325 MG tablet, Take 1 tablet by mouth every 6 (six) hours as needed for moderate pain or severe pain., Disp: 120  tablet, Rfl: 0   lisinopril (ZESTRIL) 20 MG tablet, Take 1 tablet (20 mg total) by mouth daily., Disp: 90 tablet, Rfl: 3   Multiple Vitamins-Minerals (CENTRUM SILVER ULTRA WOMENS) TABS, Take 1 tablet by mouth daily., Disp: , Rfl:    nitroGLYCERIN (NITROSTAT) 0.4 MG SL tablet, Place 1 tablet (0.4 mg total) under the tongue every 5 (five) minutes as needed for chest pain., Disp: 25 tablet, Rfl: 3   ondansetron (ZOFRAN-ODT) 4 MG disintegrating tablet, Take 1 tablet (4 mg total) by mouth every 6 (six) hours as needed for nausea  or vomiting., Disp: 20 tablet, Rfl: 2   rosuvastatin (CRESTOR) 20 MG tablet, Take 1 tablet (20 mg total) by mouth daily., Disp: 90 tablet, Rfl: 3   Past Medical History:  Diagnosis Date   Anginal pain (HCC)    Arthritis    COPD (chronic obstructive pulmonary disease) (HCC)    Depression    Dyspnea    Fibromyalgia    Headache    Heat stroke 2006   Irritable bowel syndrome    MS (multiple sclerosis) (HCC)    Followed by Dr. Sherryll Burger   Osteoarthritis of left shoulder 01/12/2022     Assessment and Plan: 1. DDD (degenerative disc disease), lumbar   2. Facet arthropathy, cervical   3. Chronic bilateral low back pain with bilateral sciatica   4. Chronic, continuous use of opioids   5. Chronic pain syndrome   6. Lumbar facet joint syndrome   7. Primary osteoarthritis of left shoulder   8. DDD (degenerative disc disease), cervical   Based on our discussion today it is appropriate to refill her medicines for the next 2 months.  These will be dated for August 16 and September 15.  No other changes in her pharmacologic regimen will be initiated.  Want her to continue efforts at stretching strengthening and walking as tolerated.  I do want her to follow-up with her pulmonologist regarding the rib pain that she is experienced.  We will schedule her for 93-month return to clinic.  Follow Up Instructions:    I discussed the assessment and treatment plan with the patient. The patient was provided an opportunity to ask questions and all were answered. The patient agreed with the plan and demonstrated an understanding of the instructions.   The patient was advised to call back or seek an in-person evaluation if the symptoms worsen or if the condition fails to improve as anticipated.  I provided 30 minutes of non-face-to-face time during this encounter.   Yevette Edwards, MD

## 2022-08-03 DIAGNOSIS — J449 Chronic obstructive pulmonary disease, unspecified: Secondary | ICD-10-CM | POA: Diagnosis not present

## 2022-08-11 ENCOUNTER — Other Ambulatory Visit: Payer: Self-pay | Admitting: Family

## 2022-08-11 DIAGNOSIS — G8929 Other chronic pain: Secondary | ICD-10-CM

## 2022-08-16 ENCOUNTER — Institutional Professional Consult (permissible substitution): Payer: Medicare Other | Admitting: Pulmonary Disease

## 2022-09-03 DIAGNOSIS — J449 Chronic obstructive pulmonary disease, unspecified: Secondary | ICD-10-CM | POA: Diagnosis not present

## 2022-09-13 ENCOUNTER — Other Ambulatory Visit: Payer: Self-pay | Admitting: Family

## 2022-09-13 DIAGNOSIS — A6004 Herpesviral vulvovaginitis: Secondary | ICD-10-CM

## 2022-09-26 ENCOUNTER — Other Ambulatory Visit: Payer: Self-pay | Admitting: Family

## 2022-09-26 DIAGNOSIS — R000 Tachycardia, unspecified: Secondary | ICD-10-CM

## 2022-09-29 ENCOUNTER — Other Ambulatory Visit: Payer: Self-pay

## 2022-09-29 ENCOUNTER — Telehealth: Payer: Self-pay | Admitting: Family

## 2022-09-29 DIAGNOSIS — J449 Chronic obstructive pulmonary disease, unspecified: Secondary | ICD-10-CM

## 2022-09-29 NOTE — Telephone Encounter (Signed)
NOTED

## 2022-09-29 NOTE — Progress Notes (Signed)
referral

## 2022-09-29 NOTE — Telephone Encounter (Signed)
Patient states she is returning call from Jenate Martinique, [place].  Domingo Mend is unavailable at the time of the call.  I let patient know that I will send a message to [facility] for her.  I read Mable Paris, FNP's message to patient.  Patient states she has never had covid.  Patient states she has to have oxygen to breathe.

## 2022-09-29 NOTE — Telephone Encounter (Signed)
Patient returned office phone call and note was read. 

## 2022-09-29 NOTE — Telephone Encounter (Signed)
Call pt Reviewing chart and see that she has not established care with pulmonology for COPD Please advise her that I would feel most comfortable if she had a pulmonologist due to h/o COPD and lung nodule  Please ask if she would reconsider  If so please place referral to [facility] : est care COPD, lung nodules

## 2022-09-29 NOTE — Telephone Encounter (Signed)
Per our conversation, that was the fax I signed this morning Please number below and ensure they have received  Let pt know status of all

## 2022-09-29 NOTE — Telephone Encounter (Signed)
Called and spoke to patient in regards to message and corrected it because she did not have Covid she has COPD! Patient informed me that she needs Oxygen at home because she is using portables now. Pt gave me number to Home Care Specialist out of [facility]...[phone] to give them a call!!! What exactly do we need to do on our part!

## 2022-09-29 NOTE — Telephone Encounter (Signed)
LVM to call back to office  

## 2022-09-29 NOTE — Telephone Encounter (Signed)
LVM to inform patient  that the rx for her oxygen had been faxed and I did call to confirm that they had received it at [facility] care Specialist in [facility] @[phone]

## 2022-10-03 DIAGNOSIS — J449 Chronic obstructive pulmonary disease, unspecified: Secondary | ICD-10-CM | POA: Diagnosis not present

## 2022-10-19 ENCOUNTER — Ambulatory Visit: Payer: Medicare Other | Attending: Anesthesiology | Admitting: Anesthesiology

## 2022-10-19 ENCOUNTER — Encounter: Payer: Self-pay | Admitting: Anesthesiology

## 2022-10-19 DIAGNOSIS — G8929 Other chronic pain: Secondary | ICD-10-CM

## 2022-10-19 DIAGNOSIS — M5441 Lumbago with sciatica, right side: Secondary | ICD-10-CM

## 2022-10-19 DIAGNOSIS — M5030 Other cervical disc degeneration, unspecified cervical region: Secondary | ICD-10-CM

## 2022-10-19 DIAGNOSIS — M5136 Other intervertebral disc degeneration, lumbar region: Secondary | ICD-10-CM

## 2022-10-19 DIAGNOSIS — M5442 Lumbago with sciatica, left side: Secondary | ICD-10-CM | POA: Diagnosis not present

## 2022-10-19 DIAGNOSIS — M47816 Spondylosis without myelopathy or radiculopathy, lumbar region: Secondary | ICD-10-CM | POA: Diagnosis not present

## 2022-10-19 DIAGNOSIS — M19012 Primary osteoarthritis, left shoulder: Secondary | ICD-10-CM | POA: Diagnosis not present

## 2022-10-19 DIAGNOSIS — G894 Chronic pain syndrome: Secondary | ICD-10-CM | POA: Diagnosis not present

## 2022-10-19 DIAGNOSIS — Z79891 Long term (current) use of opiate analgesic: Secondary | ICD-10-CM | POA: Diagnosis not present

## 2022-10-19 DIAGNOSIS — F1190 Opioid use, unspecified, uncomplicated: Secondary | ICD-10-CM

## 2022-10-19 DIAGNOSIS — M47812 Spondylosis without myelopathy or radiculopathy, cervical region: Secondary | ICD-10-CM

## 2022-10-19 MED ORDER — HYDROCODONE-ACETAMINOPHEN 5-325 MG PO TABS: 1.0000 | ORAL_TABLET | Freq: Four times a day (QID) | ORAL | 0 refills | Status: AC | PRN

## 2022-10-19 MED ORDER — HYDROCODONE-ACETAMINOPHEN 5-325 MG PO TABS: 1.0000 | ORAL_TABLET | Freq: Four times a day (QID) | ORAL | 0 refills | Status: DC | PRN

## 2022-10-20 NOTE — Progress Notes (Signed)
Virtual Visit via Telephone Note  I connected with Debra Hogan on 10/20/22 at 11:00 AM EDT by telephone and verified that I am speaking with the correct person using two identifiers.  Location: Patient: Home Provider: Pain control center   I discussed the limitations, risks, security and privacy concerns of performing an evaluation and management service by telephone and the availability of in person appointments. I also discussed with the patient that there Moe be a patient responsible charge related to this service. The patient expressed understanding and agreed to proceed.   History of Present Illness: I spoke with Debra Hogan via telephone as we are unable link for the video portion of this conference.  She reports that her pain is stable in nature with no recent changes noted.  She still having considerable back pain and leg pain occasionally some thoracic pain comparable to what she is experienced chronically.  She takes her medications as prescribed and these generally give her good relief.  She takes her medications as prescribed and they continue to give her good relief.  No other changes in the quality characteristic or distribution of the pain are noted.  She denies changes in lower extremity strength function bowel or bladder function.  No side effects are noted.  Review of systems: General: No fevers or chills Pulmonary: No shortness of breath or dyspnea Cardiac: No angina or palpitations or lightheadedness GI: No abdominal pain or constipation Psych: No depression    Observations/Objective:  Current Outpatient Medications:    acyclovir (ZOVIRAX) 400 MG tablet, Take 1 tablet by mouth once daily, Disp: 90 tablet, Rfl: 3   Albuterol Sulfate (PROAIR RESPICLICK) 962 (90 Base) MCG/ACT AEPB, Inhale 1 puff into the lungs every 6 (six) hours as needed (as needed for cough, wheezing)., Disp: 1 each, Rfl: 5   ALPRAZolam (XANAX) 0.25 MG tablet, Take 1 tablet (0.25 mg total) by mouth 2  (two) times daily as needed. for anxiety, Disp: 60 tablet, Rfl: 1   aspirin EC 81 MG tablet, Take 81 mg by mouth daily., Disp: , Rfl:    cholecalciferol (VITAMIN D) 1000 units tablet, Take 1,000 Units by mouth daily., Disp: , Rfl:    diclofenac sodium (VOLTAREN) 1 % GEL, Apply 4 g topically 4 (four) times daily., Disp: 1 Tube, Rfl: 3   diltiazem (CARDIZEM CD) 180 MG 24 hr capsule, TAKE 1 CAPSULE BY MOUTH ONCE DAILY . APPOINTMENT REQUIRED FOR FUTURE REFILLS, Disp: 90 capsule, Rfl: 0   escitalopram (LEXAPRO) 20 MG tablet, Take 1 tablet (20 mg total) by mouth daily., Disp: 90 tablet, Rfl: 1   fluticasone-salmeterol (ADVAIR) 250-50 MCG/ACT AEPB, INHALE 1 DOSE BY MOUTH TWICE DAILY (AM  AND  BEDTIME), Disp: 60 each, Rfl: 0   gabapentin (NEURONTIN) 100 MG capsule, TAKE 1 CAPSULE BY MOUTH THREE TIMES DAILY, Disp: 270 capsule, Rfl: 0   HYDROcodone-acetaminophen (NORCO/VICODIN) 5-325 MG tablet, Take 1 tablet by mouth every 6 (six) hours as needed for moderate pain or severe pain., Disp: 120 tablet, Rfl: 0   [START ON 11/18/2022] HYDROcodone-acetaminophen (NORCO/VICODIN) 5-325 MG tablet, Take 1 tablet by mouth every 6 (six) hours as needed for moderate pain or severe pain., Disp: 120 tablet, Rfl: 0   lisinopril (ZESTRIL) 20 MG tablet, Take 1 tablet (20 mg total) by mouth daily., Disp: 90 tablet, Rfl: 3   Multiple Vitamins-Minerals (CENTRUM SILVER ULTRA WOMENS) TABS, Take 1 tablet by mouth daily., Disp: , Rfl:    nitroGLYCERIN (NITROSTAT) 0.4 MG SL tablet, Place 1  tablet (0.4 mg total) under the tongue every 5 (five) minutes as needed for chest pain., Disp: 25 tablet, Rfl: 3   ondansetron (ZOFRAN-ODT) 4 MG disintegrating tablet, Take 1 tablet (4 mg total) by mouth every 6 (six) hours as needed for nausea or vomiting., Disp: 20 tablet, Rfl: 2   rosuvastatin (CRESTOR) 20 MG tablet, Take 1 tablet (20 mg total) by mouth daily., Disp: 90 tablet, Rfl: 3   Past Medical History:  Diagnosis Date   Anginal pain (HCC)     Arthritis    COPD (chronic obstructive pulmonary disease) (HCC)    Depression    Dyspnea    Fibromyalgia    Headache    Heat stroke 2006   Irritable bowel syndrome    MS (multiple sclerosis) (HCC)    Followed by Dr. Sherryll Burger   Osteoarthritis of left shoulder 01/12/2022     Assessment and Plan: 1. DDD (degenerative disc disease), lumbar   2. Facet arthropathy, cervical   3. Chronic bilateral low back pain with bilateral sciatica   4. Chronic, continuous use of opioids   5. Chronic pain syndrome   6. Lumbar facet joint syndrome   7. Primary osteoarthritis of left shoulder   8. DDD (degenerative disc disease), cervical   Based on our discussion today and upon review of the [facility] practitioner database we will plan to refill her medications for October 26 and November 25.  No other changes in her pharmacologic regimen will be initiated.  Want her to continue it stretching strengthening exercises and daily ambulation as tolerated.  Plan for return to clinic in 2 months and continue follow-up with her primary care physicians for baseline medical care.  Follow Up Instructions:    I discussed the assessment and treatment plan with the patient. The patient was provided an opportunity to ask questions and all were answered. The patient agreed with the plan and demonstrated an understanding of the instructions.   The patient was advised to call back or seek an in-person evaluation if the symptoms worsen or if the condition fails to improve as anticipated.  I provided 30 minutes of non-face-to-face time during this encounter.   Yevette Edwards, MD

## 2022-11-03 ENCOUNTER — Other Ambulatory Visit: Payer: Self-pay | Admitting: Family

## 2022-11-03 DIAGNOSIS — G4733 Obstructive sleep apnea (adult) (pediatric): Secondary | ICD-10-CM | POA: Diagnosis not present

## 2022-11-03 DIAGNOSIS — J449 Chronic obstructive pulmonary disease, unspecified: Secondary | ICD-10-CM | POA: Diagnosis not present

## 2022-11-03 DIAGNOSIS — I671 Cerebral aneurysm, nonruptured: Secondary | ICD-10-CM | POA: Diagnosis not present

## 2022-11-03 DIAGNOSIS — M5442 Lumbago with sciatica, left side: Secondary | ICD-10-CM

## 2022-11-10 ENCOUNTER — Other Ambulatory Visit: Payer: Self-pay | Admitting: Family

## 2022-11-10 DIAGNOSIS — F411 Generalized anxiety disorder: Secondary | ICD-10-CM

## 2022-12-03 DIAGNOSIS — J449 Chronic obstructive pulmonary disease, unspecified: Secondary | ICD-10-CM | POA: Diagnosis not present

## 2022-12-03 DIAGNOSIS — G4733 Obstructive sleep apnea (adult) (pediatric): Secondary | ICD-10-CM | POA: Diagnosis not present

## 2022-12-03 DIAGNOSIS — I671 Cerebral aneurysm, nonruptured: Secondary | ICD-10-CM | POA: Diagnosis not present

## 2022-12-11 ENCOUNTER — Encounter: Payer: Self-pay | Admitting: Anesthesiology

## 2022-12-11 ENCOUNTER — Ambulatory Visit: Payer: Medicare Other | Attending: Anesthesiology | Admitting: Anesthesiology

## 2022-12-11 DIAGNOSIS — M5030 Other cervical disc degeneration, unspecified cervical region: Secondary | ICD-10-CM | POA: Diagnosis not present

## 2022-12-11 DIAGNOSIS — M47816 Spondylosis without myelopathy or radiculopathy, lumbar region: Secondary | ICD-10-CM | POA: Diagnosis not present

## 2022-12-11 DIAGNOSIS — M19012 Primary osteoarthritis, left shoulder: Secondary | ICD-10-CM

## 2022-12-11 DIAGNOSIS — G894 Chronic pain syndrome: Secondary | ICD-10-CM | POA: Diagnosis not present

## 2022-12-11 DIAGNOSIS — M5442 Lumbago with sciatica, left side: Secondary | ICD-10-CM

## 2022-12-11 DIAGNOSIS — F1190 Opioid use, unspecified, uncomplicated: Secondary | ICD-10-CM

## 2022-12-11 DIAGNOSIS — M47812 Spondylosis without myelopathy or radiculopathy, cervical region: Secondary | ICD-10-CM

## 2022-12-11 DIAGNOSIS — Z79891 Long term (current) use of opiate analgesic: Secondary | ICD-10-CM

## 2022-12-11 DIAGNOSIS — M5136 Other intervertebral disc degeneration, lumbar region: Secondary | ICD-10-CM | POA: Diagnosis not present

## 2022-12-11 DIAGNOSIS — M5441 Lumbago with sciatica, right side: Secondary | ICD-10-CM | POA: Diagnosis not present

## 2022-12-11 DIAGNOSIS — G8929 Other chronic pain: Secondary | ICD-10-CM

## 2022-12-11 MED ORDER — HYDROCODONE-ACETAMINOPHEN 5-325 MG PO TABS: 1.0000 | ORAL_TABLET | Freq: Four times a day (QID) | ORAL | 0 refills | Status: AC | PRN

## 2022-12-11 MED ORDER — CYCLOBENZAPRINE HCL 10 MG PO TABS: 10.0000 mg | ORAL_TABLET | Freq: Every day | ORAL | 4 refills | Status: AC

## 2022-12-11 NOTE — Progress Notes (Signed)
Virtual Visit via Telephone Note  I connected with Debra Hogan on 12/11/22 at 11:40 AM EST by telephone and verified that I am speaking with the correct person using two identifiers.  Location: Patient: Home Provider: Pain control center   I discussed the limitations, risks, security and privacy concerns of performing an evaluation and management service by telephone and the availability of in person appointments. I also discussed with the patient that there Eddie be a patient responsible charge related to this service. The patient expressed understanding and agreed to proceed.   History of Present Illness: I spoke with Debra Hogan via telephone as we were unable like for the video portion.  She reports that her back pain is stable in nature with no recent changes.  The quality characteristic and intensity are stable in nature she still takes her hydrocodone about every 6-8 hours averaging about 3 times per day and gets good relief with this rated about 60 to 70%.  She has failed more conservative therapy.  She has not had much success with interventional therapy either.  She is considered a nonsurgical candidate and has failed more conservative therapy as mentioned.  She continues to try to do stretching strengthening exercises and stay active as best she can.  The medications help her stay active and sleep better at night.  No side effects are reported.  Review of systems: General: No fevers or chills Pulmonary: No shortness of breath or dyspnea Cardiac: No angina or palpitations or lightheadedness GI: No abdominal pain or constipation Psych: No depression    Observations/Objective:   Current Outpatient Medications:    cyclobenzaprine (FLEXERIL) 10 MG tablet, Take 1 tablet (10 mg total) by mouth at bedtime., Disp: 30 tablet, Rfl: 4   [START ON 01/23/2023] HYDROcodone-acetaminophen (NORCO/VICODIN) 5-325 MG tablet, Take 1 tablet by mouth every 6 (six) hours as needed for moderate pain or severe  pain., Disp: 120 tablet, Rfl: 0   acyclovir (ZOVIRAX) 400 MG tablet, Take 1 tablet by mouth once daily, Disp: 90 tablet, Rfl: 3   Albuterol Sulfate (PROAIR RESPICLICK) 108 (90 Base) MCG/ACT AEPB, Inhale 1 puff into the lungs every 6 (six) hours as needed (as needed for cough, wheezing)., Disp: 1 each, Rfl: 5   ALPRAZolam (XANAX) 0.25 MG tablet, Take 1 tablet (0.25 mg total) by mouth 2 (two) times daily as needed. for anxiety, Disp: 60 tablet, Rfl: 1   aspirin EC 81 MG tablet, Take 81 mg by mouth daily., Disp: , Rfl:    cholecalciferol (VITAMIN D) 1000 units tablet, Take 1,000 Units by mouth daily., Disp: , Rfl:    diltiazem (CARDIZEM CD) 180 MG 24 hr capsule, TAKE 1 CAPSULE BY MOUTH ONCE DAILY . APPOINTMENT REQUIRED FOR FUTURE REFILLS, Disp: 90 capsule, Rfl: 0   escitalopram (LEXAPRO) 20 MG tablet, Take 1 tablet by mouth once daily, Disp: 90 tablet, Rfl: 0   fluticasone-salmeterol (ADVAIR) 250-50 MCG/ACT AEPB, INHALE 1 DOSE BY MOUTH TWICE DAILY (AM  AND  BEDTIME), Disp: 60 each, Rfl: 0   gabapentin (NEURONTIN) 100 MG capsule, TAKE 1 CAPSULE BY MOUTH THREE TIMES DAILY, Disp: 270 capsule, Rfl: 0   [START ON 12/23/2022] HYDROcodone-acetaminophen (NORCO/VICODIN) 5-325 MG tablet, Take 1 tablet by mouth every 6 (six) hours as needed for moderate pain or severe pain., Disp: 120 tablet, Rfl: 0   lisinopril (ZESTRIL) 20 MG tablet, Take 1 tablet (20 mg total) by mouth daily., Disp: 90 tablet, Rfl: 3   Multiple Vitamins-Minerals (CENTRUM SILVER ULTRA WOMENS) TABS,  Take 1 tablet by mouth daily., Disp: , Rfl:    nitroGLYCERIN (NITROSTAT) 0.4 MG SL tablet, Place 1 tablet (0.4 mg total) under the tongue every 5 (five) minutes as needed for chest pain., Disp: 25 tablet, Rfl: 3   ondansetron (ZOFRAN-ODT) 4 MG disintegrating tablet, Take 1 tablet (4 mg total) by mouth every 6 (six) hours as needed for nausea or vomiting., Disp: 20 tablet, Rfl: 2   rosuvastatin (CRESTOR) 20 MG tablet, Take 1 tablet (20 mg total) by  mouth daily., Disp: 90 tablet, Rfl: 3   Past Medical History:  Diagnosis Date   Anginal pain (HCC)    Arthritis    COPD (chronic obstructive pulmonary disease) (HCC)    Depression    Dyspnea    Fibromyalgia    Headache    Heat stroke 2006   Irritable bowel syndrome    MS (multiple sclerosis) (HCC)    Followed by Dr. Sherryll Burger   Osteoarthritis of left shoulder 01/12/2022    Assessment and Plan: 1. DDD (degenerative disc disease), lumbar   2. Facet arthropathy, cervical   3. Chronic bilateral low back pain with bilateral sciatica   4. Chronic, continuous use of opioids   5. Chronic pain syndrome   6. Lumbar facet joint syndrome   7. Primary osteoarthritis of left shoulder   8. DDD (degenerative disc disease), cervical   Based on discussion today I think it is appropriate to refill her medicines for the next 2 months.  Will be dated for December 31 and January 30 but we will move 31 December to the 30th as it is a Saturday.  I have reviewed the [facility] practitioner database information and it is appropriate..  No other changes in her pharmacologic regimen will be initiated.  I encouraged her to continue with stretching strengthening exercises.  She continues take her Flexeril 1 tablet at bedtime to help with spasms and sleep.  This regimen seems to be working well for her.  She is scheduled for 78-month return to clinic and we requested she continue follow-up with her primary care physicians for baseline medical care.  Follow Up Instructions:    I discussed the assessment and treatment plan with the patient. The patient was provided an opportunity to ask questions and all were answered. The patient agreed with the plan and demonstrated an understanding of the instructions.   The patient was advised to call back or seek an in-person evaluation if the symptoms worsen or if the condition fails to improve as anticipated.  I provided 25 minutes of non-face-to-face time during this  encounter.   Yevette Edwards, MD

## 2022-12-12 ENCOUNTER — Telehealth: Payer: Medicare Other | Admitting: Anesthesiology

## 2022-12-16 ENCOUNTER — Other Ambulatory Visit: Payer: Self-pay | Admitting: Family

## 2022-12-16 DIAGNOSIS — F32A Depression, unspecified: Secondary | ICD-10-CM

## 2022-12-21 ENCOUNTER — Other Ambulatory Visit: Payer: Self-pay | Admitting: Family

## 2022-12-21 DIAGNOSIS — R000 Tachycardia, unspecified: Secondary | ICD-10-CM

## 2023-01-04 ENCOUNTER — Telehealth: Payer: Self-pay | Admitting: Anesthesiology

## 2023-01-04 NOTE — Telephone Encounter (Signed)
PA was done. Patient notified.

## 2023-01-04 NOTE — Telephone Encounter (Signed)
PT stated that pharmacy is waiting on PA so patient can get refill for prescription. Please give patient a call. Thanks

## 2023-01-10 ENCOUNTER — Telehealth: Payer: Self-pay | Admitting: Anesthesiology

## 2023-01-10 NOTE — Telephone Encounter (Signed)
Patient needs PA on pain meds

## 2023-01-11 ENCOUNTER — Other Ambulatory Visit: Payer: Self-pay

## 2023-01-11 NOTE — Telephone Encounter (Signed)
Insurance will only fill a 7 day supply due to it being a [employer].  We will need to send in the remaining 23 days.

## 2023-01-11 NOTE — Telephone Encounter (Signed)
Attempted to call patient to inform her that she was going to have to get a 7 day supply because of her new insurance.  Left another message to call us back.

## 2023-01-11 NOTE — Telephone Encounter (Signed)
Patient called back to check on medication and prior auth.  I will talk to [facility] and see where we are in this matter.

## 2023-01-11 NOTE — Telephone Encounter (Signed)
Attempted to call pharmacy. Rand x 4 minutes and noone answered.

## 2023-01-12 ENCOUNTER — Ambulatory Visit (INDEPENDENT_AMBULATORY_CARE_PROVIDER_SITE_OTHER): Payer: PPO | Admitting: Family

## 2023-01-12 ENCOUNTER — Other Ambulatory Visit (HOSPITAL_COMMUNITY)
Admission: RE | Admit: 2023-01-12 | Discharge: 2023-01-12 | Disposition: A | Discharge status: Home / Self Care | Payer: PPO | Source: Ambulatory Visit | Attending: Family | Admitting: Family

## 2023-01-12 ENCOUNTER — Encounter: Payer: Self-pay | Admitting: Family

## 2023-01-12 VITALS — BP 138/88 | HR 112 | Temp 98.4°F | Ht 62.0 in | Wt 158.0 lb

## 2023-01-12 DIAGNOSIS — I10 Essential (primary) hypertension: Secondary | ICD-10-CM

## 2023-01-12 DIAGNOSIS — F32A Depression, unspecified: Secondary | ICD-10-CM

## 2023-01-12 DIAGNOSIS — I700 Atherosclerosis of aorta: Secondary | ICD-10-CM | POA: Diagnosis not present

## 2023-01-12 DIAGNOSIS — M5442 Lumbago with sciatica, left side: Secondary | ICD-10-CM

## 2023-01-12 DIAGNOSIS — G8929 Other chronic pain: Secondary | ICD-10-CM

## 2023-01-12 DIAGNOSIS — N898 Other specified noninflammatory disorders of vagina: Secondary | ICD-10-CM | POA: Diagnosis not present

## 2023-01-12 DIAGNOSIS — F411 Generalized anxiety disorder: Secondary | ICD-10-CM

## 2023-01-12 DIAGNOSIS — F419 Anxiety disorder, unspecified: Secondary | ICD-10-CM | POA: Diagnosis not present

## 2023-01-12 DIAGNOSIS — J449 Chronic obstructive pulmonary disease, unspecified: Secondary | ICD-10-CM | POA: Diagnosis not present

## 2023-01-12 DIAGNOSIS — R000 Tachycardia, unspecified: Secondary | ICD-10-CM | POA: Diagnosis not present

## 2023-01-12 DIAGNOSIS — M5441 Lumbago with sciatica, right side: Secondary | ICD-10-CM

## 2023-01-12 DIAGNOSIS — A6004 Herpesviral vulvovaginitis: Secondary | ICD-10-CM | POA: Diagnosis not present

## 2023-01-12 MED ORDER — TRAZODONE HCL 50 MG PO TABS: 25.0000 mg | ORAL_TABLET | Freq: Every day | ORAL | 3 refills | Status: DC

## 2023-01-12 MED ORDER — ROSUVASTATIN CALCIUM 20 MG PO TABS: 20.0000 mg | ORAL_TABLET | Freq: Every day | ORAL | 3 refills | Status: DC

## 2023-01-12 MED ORDER — FLUCONAZOLE 150 MG PO TABS: 150.0000 mg | ORAL_TABLET | Freq: Once | ORAL | 1 refills | Status: AC

## 2023-01-12 MED ORDER — FLUTICASONE-SALMETEROL 250-50 MCG/ACT IN AEPB: INHALATION_SPRAY | RESPIRATORY_TRACT | 3 refills | Status: DC

## 2023-01-12 MED ORDER — ESCITALOPRAM OXALATE 20 MG PO TABS: 20.0000 mg | ORAL_TABLET | Freq: Every day | ORAL | 3 refills | Status: DC

## 2023-01-12 MED ORDER — LISINOPRIL 20 MG PO TABS: 20.0000 mg | ORAL_TABLET | Freq: Every day | ORAL | 3 refills | Status: DC

## 2023-01-12 MED ORDER — ACYCLOVIR 400 MG PO TABS: 400.0000 mg | ORAL_TABLET | Freq: Every day | ORAL | 3 refills | Status: DC

## 2023-01-12 MED ORDER — GABAPENTIN 100 MG PO CAPS: 100.0000 mg | ORAL_CAPSULE | Freq: Three times a day (TID) | ORAL | 3 refills | Status: DC

## 2023-01-12 MED ORDER — DILTIAZEM HCL ER COATED BEADS 180 MG PO CP24: ORAL_CAPSULE | ORAL | 3 refills | Status: DC

## 2023-01-12 MED ORDER — ALPRAZOLAM 0.25 MG PO TABS: 0.2500 mg | ORAL_TABLET | Freq: Two times a day (BID) | ORAL | 2 refills | Status: DC | PRN

## 2023-01-12 NOTE — Patient Instructions (Addendum)
Start trazodone 25 mg at bedtime to help with sleep.  Please separate this medication from Xanax by a couple of hours.   Please know that I am thinking of you and if there is anything you need at all, please let me know.

## 2023-01-12 NOTE — Progress Notes (Signed)
Assessment & Plan:  Atherosclerosis of aorta (HCC) -     Rosuvastatin Calcium; Take 1 tablet (20 mg total) by mouth daily.  Dispense: 90 tablet; Refill: 3  Generalized anxiety disorder -     Escitalopram Oxalate; Take 1 tablet (20 mg total) by mouth daily.  Dispense: 90 tablet; Refill: 3 -     traZODone HCl; Take 0.5 tablets (25 mg total) by mouth at bedtime. Start with half tablet and increase to full tablet nightly if needed  Dispense: 90 tablet; Refill: 3  Anxiety and depression Assessment & Plan: Grief reaction after sudden loss of son.  Patient politely declines grief counselor at this time.  We discussed trouble sleeping;  we will start low-dose trazodone 25 mg.  Patient Debra Hogan increase to 50 mg if needed.  Continue Lexapro 20 mg qd and xanax 0.25mg  BID.  Refilled today.  Orders: -     ALPRAZolam; Take 1 tablet (0.25 mg total) by mouth 2 (two) times daily as needed (fill early 01/13/23). for anxiety  Dispense: 60 tablet; Refill: 2  Herpes simplex vulvovaginitis -     Acyclovir; Take 1 tablet (400 mg total) by mouth daily.  Dispense: 90 tablet; Refill: 3  Sinus tachycardia -     dilTIAZem HCl ER Coated Beads; TAKE 1 CAPSULE BY MOUTH ONCE DAILY  Dispense: 90 capsule; Refill: 3  Chronic bilateral low back pain with bilateral sciatica -     Gabapentin; Take 1 capsule (100 mg total) by mouth 3 (three) times daily.  Dispense: 270 capsule; Refill: 3  Primary hypertension Assessment & Plan: Chronic, stable. Continue cardizem 180mg , lisinopril 20mg   Orders: -     Lisinopril; Take 1 tablet (20 mg total) by mouth daily.  Dispense: 90 tablet; Refill: 3 -     COMPLETE METABOLIC PANEL WITH GFR  COPD, moderate (HCC) Assessment & Plan: Chronic, stable.  Due for CT lung cancer screening.  She is seeing Dr. Patsey Hogan next month.  I sent a staff message to Debra Hogan and Dr. Patsey Hogan in regards to ensure that CT lung cancer screening program is resumed. Will follow.   Orders: -      Fluticasone-Salmeterol; INHALE 1 DOSE BY MOUTH TWICE DAILY (AM  AND  BEDTIME)  Dispense: 60 each; Refill: 3  Vaginal itching Assessment & Plan: Pending wet prep.  Start Diflucan in advance  Orders: -     Cervicovaginal ancillary only -     Fluconazole; Take 1 tablet (150 mg total) by mouth once for 1 dose. Take one tablet PO once. If sxs persist, Newman take one tablet PO 3 days later.  Dispense: 2 tablet; Refill: 1     Return precautions given.   Risks, benefits, and alternatives of the medications and treatment plan prescribed today were discussed, and patient expressed understanding.   Education regarding symptom management and diagnosis given to patient on AVS either electronically or printed.  Return in about 3 months (around 04/13/2023).  Debra Paris, FNP  Subjective:    Patient ID: Debra Hogan, female    DOB: 1961/08/10, 61 y.o.   MRN: [id number]  CC: Debra Hogan is a 62 y.o. female who presents today for follow up.   HPI: Patient expresses grief after losing her son unexpectedly last month at 34 years of age.  She been deeply saddened by this.  Endorses trouble sleeping.  Denies thoughts of hurting herself or anyone else.  She is compliant with Lexapro 20 mg, Xanax 0.25 mg BID.  She takes Xanax at night to help her sleep.  She reports using medication more frequently due to grief.  She has 8 tablets left.   She complains of vaginal itching and concern for yeast infection.   no dysuria  She reports breathing at baseline.  No increased shortness of breath, cough.  She is upcoming follow-up with pulmonology and cardiology next month.   Declines colon cancer screening at this time.  She is due for CT lung cancer screening  Allergies: Valium [diazepam] Current Outpatient Medications on File Prior to Visit  Medication Sig Dispense Refill   Albuterol Sulfate (PROAIR RESPICLICK) 235 (90 Base) MCG/ACT AEPB Inhale 1 puff into the lungs every 6 (six) hours as needed (as needed  for cough, wheezing). 1 each 5   aspirin EC 81 MG tablet Take 81 mg by mouth daily.     cholecalciferol (VITAMIN D) 1000 units tablet Take 1,000 Units by mouth daily.     cyclobenzaprine (FLEXERIL) 10 MG tablet Take 1 tablet (10 mg total) by mouth at bedtime. 30 tablet 4   HYDROcodone-acetaminophen (NORCO/VICODIN) 5-325 MG tablet Take 1 tablet by mouth every 6 (six) hours as needed for moderate pain or severe pain. 120 tablet 0   [START ON 01/23/2023] HYDROcodone-acetaminophen (NORCO/VICODIN) 5-325 MG tablet Take 1 tablet by mouth every 6 (six) hours as needed for moderate pain or severe pain. 120 tablet 0   Multiple Vitamins-Minerals (CENTRUM SILVER ULTRA WOMENS) TABS Take 1 tablet by mouth daily.     ondansetron (ZOFRAN-ODT) 4 MG disintegrating tablet Take 1 tablet (4 mg total) by mouth every 6 (six) hours as needed for nausea or vomiting. 20 tablet 2   nitroGLYCERIN (NITROSTAT) 0.4 MG SL tablet Place 1 tablet (0.4 mg total) under the tongue every 5 (five) minutes as needed for chest pain. 25 tablet 3   No current facility-administered medications on file prior to visit.    Review of Systems  Constitutional:  Negative for chills and fever.  Respiratory:  Positive for shortness of breath (at baseline). Negative for cough.   Cardiovascular:  Negative for chest pain and palpitations.  Gastrointestinal:  Negative for nausea and vomiting.  Psychiatric/Behavioral:  Positive for sleep disturbance. Negative for suicidal ideas. The patient is nervous/anxious.       Objective:    BP 138/88   Pulse (!) 112   Temp 98.4 F (36.9 C) (Oral)   Ht 5\' 2"  (1.575 m)   Wt 158 lb (71.7 kg)   SpO2 93%   BMI 28.90 kg/m  BP Readings from Last 3 Encounters:  01/12/23 138/88  04/05/22 136/86  03/15/22 (!) 148/94   Wt Readings from Last 3 Encounters:  01/12/23 158 lb (71.7 kg)  05/29/22 155 lb (70.3 kg)  05/17/22 155 lb (70.3 kg)    Physical Exam Vitals reviewed.  Constitutional:      Appearance:  She is well-developed.  Eyes:     Conjunctiva/sclera: Conjunctivae normal.  Cardiovascular:     Rate and Rhythm: Normal rate and regular rhythm.     Pulses: Normal pulses.     Heart sounds: Normal heart sounds.  Pulmonary:     Effort: Pulmonary effort is normal.     Breath sounds: Normal breath sounds. No wheezing, rhonchi or rales.  Skin:    General: Skin is warm and dry.  Neurological:     Mental Status: She is alert.  Psychiatric:        Speech: Speech normal.  Behavior: Behavior normal.        Thought Content: Thought content normal.

## 2023-01-12 NOTE — Assessment & Plan Note (Signed)
Pending wet prep.  Start Diflucan in advance

## 2023-01-12 NOTE — Assessment & Plan Note (Signed)
Grief reaction after sudden loss of son.  Patient politely declines grief counselor at this time.  We discussed trouble sleeping;  we will start low-dose trazodone 25 mg.  Patient Scorza increase to 50 mg if needed.  Continue Lexapro 20 mg qd and xanax 0.25mg  BID.  Refilled today.

## 2023-01-12 NOTE — Assessment & Plan Note (Signed)
Chronic, stable.  Due for CT lung cancer screening.  She is seeing Dr. Patsey Berthold next month.  I sent a staff message to Eric Form and Dr. Patsey Berthold in regards to ensure that CT lung cancer screening program is resumed. Will follow.

## 2023-01-12 NOTE — Assessment & Plan Note (Signed)
Chronic, stable. Continue cardizem 180mg , lisinopril 20mg 

## 2023-01-13 LAB — COMPLETE METABOLIC PANEL WITH GFR
AG Ratio: 1.7 (calc) (ref 1.0–2.5)
ALT: 12 U/L (ref 6–29)
AST: 19 U/L (ref 10–35)
Albumin: 4.3 g/dL (ref 3.6–5.1)
Alkaline phosphatase (APISO): 71 U/L (ref 37–153)
BUN: 19 mg/dL (ref 7–25)
CO2: 31 mmol/L (ref 20–32)
Calcium: 9.4 mg/dL (ref 8.6–10.4)
Chloride: 99 mmol/L (ref 98–110)
Creat: 0.95 mg/dL (ref 0.50–1.05)
Globulin: 2.6 g/dL (calc) (ref 1.9–3.7)
Glucose, Bld: 88 mg/dL (ref 65–99)
Potassium: 3.9 mmol/L (ref 3.5–5.3)
Sodium: 142 mmol/L (ref 135–146)
Total Bilirubin: 0.3 mg/dL (ref 0.2–1.2)
Total Protein: 6.9 g/dL (ref 6.1–8.1)
eGFR: 67 mL/min/{1.73_m2} (ref >=60)

## 2023-01-14 ENCOUNTER — Encounter: Payer: Self-pay | Admitting: Family

## 2023-01-16 ENCOUNTER — Other Ambulatory Visit: Payer: Self-pay | Admitting: Family

## 2023-01-16 DIAGNOSIS — B9689 Other specified bacterial agents as the cause of diseases classified elsewhere: Secondary | ICD-10-CM

## 2023-01-16 DIAGNOSIS — G4700 Insomnia, unspecified: Secondary | ICD-10-CM

## 2023-01-16 DIAGNOSIS — Z122 Encounter for screening for malignant neoplasm of respiratory organs: Secondary | ICD-10-CM

## 2023-01-16 LAB — CERVICOVAGINAL ANCILLARY ONLY
Bacterial Vaginitis (gardnerella): POSITIVE — AB
Candida Glabrata: NEGATIVE
Candida Vaginitis: NEGATIVE
Chlamydia: NEGATIVE
Comment: NEGATIVE
Comment: NEGATIVE
Comment: NEGATIVE
Comment: NEGATIVE
Comment: NEGATIVE
Comment: NORMAL
Neisseria Gonorrhea: NEGATIVE
Trichomonas: NEGATIVE

## 2023-01-16 MED ORDER — METRONIDAZOLE 500 MG PO TABS: 500.0000 mg | ORAL_TABLET | Freq: Two times a day (BID) | ORAL | 0 refills | Status: DC

## 2023-01-16 MED ORDER — HYDROXYZINE HCL 10 MG PO TABS: 10.0000 mg | ORAL_TABLET | Freq: Two times a day (BID) | ORAL | 0 refills | Status: DC | PRN

## 2023-01-31 DIAGNOSIS — F1190 Opioid use, unspecified, uncomplicated: Secondary | ICD-10-CM | POA: Diagnosis not present

## 2023-01-31 DIAGNOSIS — G894 Chronic pain syndrome: Secondary | ICD-10-CM | POA: Diagnosis not present

## 2023-02-01 ENCOUNTER — Institutional Professional Consult (permissible substitution): Payer: Medicare Other | Admitting: Student in an Organized Health Care Education/Training Program

## 2023-02-01 ENCOUNTER — Institutional Professional Consult (permissible substitution): Payer: Medicare Other | Admitting: Pulmonary Disease

## 2023-02-03 LAB — TOXASSURE SELECT 13 (MW), URINE

## 2023-02-09 ENCOUNTER — Ambulatory Visit: Payer: PPO | Admitting: Cardiovascular Disease

## 2023-02-21 ENCOUNTER — Institutional Professional Consult (permissible substitution): Payer: Medicare Other | Admitting: Student in an Organized Health Care Education/Training Program

## 2023-02-23 ENCOUNTER — Telehealth (INDEPENDENT_AMBULATORY_CARE_PROVIDER_SITE_OTHER): Payer: PPO | Admitting: Nurse Practitioner

## 2023-02-23 ENCOUNTER — Encounter: Payer: Self-pay | Admitting: Family

## 2023-02-23 ENCOUNTER — Encounter: Payer: PPO | Admitting: Family

## 2023-02-23 ENCOUNTER — Telehealth: Payer: Self-pay

## 2023-02-23 DIAGNOSIS — T3695XA Adverse effect of unspecified systemic antibiotic, initial encounter: Secondary | ICD-10-CM

## 2023-02-23 DIAGNOSIS — J441 Chronic obstructive pulmonary disease with (acute) exacerbation: Secondary | ICD-10-CM | POA: Diagnosis not present

## 2023-02-23 DIAGNOSIS — B379 Candidiasis, unspecified: Secondary | ICD-10-CM | POA: Diagnosis not present

## 2023-02-23 MED ORDER — PREDNISONE 20 MG PO TABS: 40.0000 mg | ORAL_TABLET | Freq: Every day | ORAL | 0 refills | Status: DC

## 2023-02-23 MED ORDER — AZITHROMYCIN 250 MG PO TABS: ORAL_TABLET | ORAL | 0 refills | Status: AC

## 2023-02-23 MED ORDER — FLUCONAZOLE 150 MG PO TABS: 150.0000 mg | ORAL_TABLET | Freq: Once | ORAL | 0 refills | Status: AC

## 2023-02-23 MED ORDER — BENZONATATE 100 MG PO CAPS: 100.0000 mg | ORAL_CAPSULE | Freq: Two times a day (BID) | ORAL | 0 refills | Status: DC | PRN

## 2023-02-23 NOTE — Progress Notes (Signed)
Established Patient Office Visit  An audio-only tele-health visit was completed today for this patient. I connected with  Debra Hogan on 02/23/23 utilizing audio-only technology and verified that I am speaking with the correct person using two identifiers. The patient was located at their home, and I was located at the office of [place] at [facility] during the encounter. I discussed the limitations of evaluation and management by telemedicine. The patient expressed understanding and agreed to proceed.  Patient did not have access to a equipment in which she could complete a video visit, thus audio only visit was completed.    Subjective   Patient ID: Debra Hogan, female    DOB: November 26, 1961  Age: 62 y.o. MRN: [id number]  Chief Complaint  Patient presents with   Cough    Symptom onset greater than 2 weeks ago.  Is experiencing cough with increased shortness of breath and sputum production.  Has history of COPD.  Also having nasal congestion and fever.  Has used Flonase, Tylenol, Mucinex, and Coricidin with temporary improvement in symptoms.  Took a COVID-19 test 3 days ago and it was negative.    Review of Systems  Constitutional:  Positive for fever (treat with tylenol) and malaise/fatigue.  HENT:  Positive for congestion and sinus pain.   Respiratory:  Positive for sputum production, shortness of breath and wheezing. Negative for cough.   Cardiovascular:  Negative for chest pain.  Gastrointestinal:  Positive for nausea.  Neurological:  Positive for headaches.      Objective:     There were no vitals taken for this visit. BP Readings from Last 3 Encounters:  01/12/23 138/88  04/05/22 136/86  03/15/22 (!) 148/94   Wt Readings from Last 3 Encounters:  01/12/23 158 lb (71.7 kg)  05/29/22 155 lb (70.3 kg)  05/17/22 155 lb (70.3 kg)      Physical Exam Comprehensive physical exam not completed today as office visit was conducted remotely.  Patient sounded  fairly well over phone, she did not sound severely short of breath she did cough a few times..  Patient was alert and oriented, and appeared to have appropriate judgment.  No results found for any visits on 02/23/23.    The 10-year ASCVD risk score (Arnett DK, et al., 2019) is: 11.8%    Assessment & Plan:   Problem List Items Addressed This Visit       Respiratory   COPD exacerbation ([place]) - Primary    Acute, treat with azithromycin, continue using albuterol as needed, prednisone for 5 days, Tessalon Perles for cough suppression.  Patient educated to avoid NSAIDs while taking prednisone.  Patient can also continue using Flonase, Tylenol, Coricidin, and Mucinex as needed.  Educated to not exceed 3000 mg of acetaminophen in a 24-hour period from all sources.  Patient notified to follow-up with PCP in person if symptoms continue to persist or call 911 if they worsen.      Relevant Medications   azithromycin (ZITHROMAX) 250 MG tablet   predniSONE (DELTASONE) 20 MG tablet   benzonatate (TESSALON) 100 MG capsule   Other Visit Diagnoses     Antibiotic-induced yeast infection       Relevant Medications   azithromycin (ZITHROMAX) 250 MG tablet   fluconazole (DIFLUCAN) 150 MG tablet       Return if symptoms worsen or fail to improve.  Total time spent with telephone today was 8 minutes and 38 seconds.   Ailene Ards, NP

## 2023-02-23 NOTE — Assessment & Plan Note (Signed)
Acute, treat with azithromycin, continue using albuterol as needed, prednisone for 5 days, Tessalon Perles for cough suppression.  Patient educated to avoid NSAIDs while taking prednisone.  Patient can also continue using Flonase, Tylenol, Coricidin, and Mucinex as needed.  Educated to not exceed 3000 mg of acetaminophen in a 24-hour period from all sources.  Patient notified to follow-up with PCP in person if symptoms continue to persist or call 911 if they worsen.

## 2023-02-23 NOTE — Telephone Encounter (Signed)
LVM to call back to begin video visit

## 2023-02-25 NOTE — Progress Notes (Signed)
This encounter was created in error - please disregard.

## 2023-02-28 ENCOUNTER — Encounter: Payer: PPO | Admitting: Anesthesiology

## 2023-03-01 ENCOUNTER — Other Ambulatory Visit: Payer: Self-pay | Admitting: Family

## 2023-03-01 ENCOUNTER — Ambulatory Visit: Payer: PPO | Attending: Anesthesiology | Admitting: Anesthesiology

## 2023-03-01 ENCOUNTER — Encounter: Payer: Self-pay | Admitting: Anesthesiology

## 2023-03-01 DIAGNOSIS — M19012 Primary osteoarthritis, left shoulder: Secondary | ICD-10-CM | POA: Diagnosis not present

## 2023-03-01 DIAGNOSIS — G894 Chronic pain syndrome: Secondary | ICD-10-CM | POA: Diagnosis not present

## 2023-03-01 DIAGNOSIS — F1190 Opioid use, unspecified, uncomplicated: Secondary | ICD-10-CM

## 2023-03-01 DIAGNOSIS — G8929 Other chronic pain: Secondary | ICD-10-CM

## 2023-03-01 DIAGNOSIS — M5442 Lumbago with sciatica, left side: Secondary | ICD-10-CM

## 2023-03-01 DIAGNOSIS — M47812 Spondylosis without myelopathy or radiculopathy, cervical region: Secondary | ICD-10-CM | POA: Diagnosis not present

## 2023-03-01 DIAGNOSIS — M5030 Other cervical disc degeneration, unspecified cervical region: Secondary | ICD-10-CM

## 2023-03-01 DIAGNOSIS — G4700 Insomnia, unspecified: Secondary | ICD-10-CM

## 2023-03-01 DIAGNOSIS — M5441 Lumbago with sciatica, right side: Secondary | ICD-10-CM | POA: Diagnosis not present

## 2023-03-01 DIAGNOSIS — M47816 Spondylosis without myelopathy or radiculopathy, lumbar region: Secondary | ICD-10-CM

## 2023-03-01 DIAGNOSIS — M5136 Other intervertebral disc degeneration, lumbar region: Secondary | ICD-10-CM

## 2023-03-01 MED ORDER — HYDROCODONE-ACETAMINOPHEN 5-325 MG PO TABS: 1.0000 | ORAL_TABLET | Freq: Four times a day (QID) | ORAL | 0 refills | Status: AC | PRN
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
## 2023-03-19 ENCOUNTER — Encounter: Payer: PPO | Admitting: Anesthesiology

## 2023-03-21 ENCOUNTER — Other Ambulatory Visit: Payer: Self-pay | Admitting: *Deleted

## 2023-03-21 DIAGNOSIS — F17210 Nicotine dependence, cigarettes, uncomplicated: Secondary | ICD-10-CM

## 2023-03-21 DIAGNOSIS — Z87891 Personal history of nicotine dependence: Secondary | ICD-10-CM

## 2023-03-21 DIAGNOSIS — Z122 Encounter for screening for malignant neoplasm of respiratory organs: Secondary | ICD-10-CM

## 2023-03-26 ENCOUNTER — Other Ambulatory Visit: Payer: Self-pay | Admitting: Family

## 2023-03-26 DIAGNOSIS — Z1231 Encounter for screening mammogram for malignant neoplasm of breast: Secondary | ICD-10-CM

## 2023-04-03 ENCOUNTER — Ambulatory Visit: Admission: RE | Admit: 2023-04-03 | Payer: PPO | Source: Ambulatory Visit

## 2023-04-06 ENCOUNTER — Ambulatory Visit: Admission: RE | Admit: 2023-04-06 | Payer: PPO | Source: Ambulatory Visit

## 2023-04-09 ENCOUNTER — Ambulatory Visit
Admission: RE | Admit: 2023-04-09 | Discharge: 2023-04-09 | Disposition: A | Discharge status: Home / Self Care | Payer: PPO | Source: Ambulatory Visit | Attending: Acute Care | Admitting: Acute Care

## 2023-04-09 DIAGNOSIS — Z87891 Personal history of nicotine dependence: Secondary | ICD-10-CM | POA: Diagnosis not present

## 2023-04-09 DIAGNOSIS — F17210 Nicotine dependence, cigarettes, uncomplicated: Secondary | ICD-10-CM | POA: Insufficient documentation

## 2023-04-09 DIAGNOSIS — Z122 Encounter for screening for malignant neoplasm of respiratory organs: Secondary | ICD-10-CM

## 2023-04-12 ENCOUNTER — Other Ambulatory Visit: Payer: Self-pay | Admitting: Acute Care

## 2023-04-12 DIAGNOSIS — Z122 Encounter for screening for malignant neoplasm of respiratory organs: Secondary | ICD-10-CM

## 2023-04-12 DIAGNOSIS — Z87891 Personal history of nicotine dependence: Secondary | ICD-10-CM

## 2023-04-12 DIAGNOSIS — F17210 Nicotine dependence, cigarettes, uncomplicated: Secondary | ICD-10-CM

## 2023-04-16 ENCOUNTER — Encounter: Payer: Self-pay | Admitting: Family

## 2023-04-17 ENCOUNTER — Telehealth: Payer: Self-pay | Admitting: Acute Care

## 2023-04-17 ENCOUNTER — Institutional Professional Consult (permissible substitution): Payer: Medicare Other | Admitting: Student in an Organized Health Care Education/Training Program

## 2023-04-17 NOTE — Telephone Encounter (Addendum)
Consult visits can not be virtual. Patient has not seen Dr. Aundria Rud previously.  Spoke to patient and made her aware of this information.  She would like to cancel appt and call back to reschedule. Appt canceled per patient request.  Nothing further needed.

## 2023-04-17 NOTE — Telephone Encounter (Signed)
Pt is asking for a video visit since this appointment is just to go over CT results.

## 2023-05-01 ENCOUNTER — Encounter: Payer: PPO | Admitting: Anesthesiology

## 2023-05-02 ENCOUNTER — Ambulatory Visit: Payer: PPO | Attending: Anesthesiology | Admitting: Anesthesiology

## 2023-05-02 ENCOUNTER — Encounter: Payer: Self-pay | Admitting: Anesthesiology

## 2023-05-02 VITALS — BP 131/68 | HR 106 | Temp 96.9°F | Resp 16 | Ht 65.0 in | Wt 154.0 lb

## 2023-05-02 DIAGNOSIS — M5442 Lumbago with sciatica, left side: Secondary | ICD-10-CM | POA: Diagnosis not present

## 2023-05-02 DIAGNOSIS — M47816 Spondylosis without myelopathy or radiculopathy, lumbar region: Secondary | ICD-10-CM | POA: Insufficient documentation

## 2023-05-02 DIAGNOSIS — M5441 Lumbago with sciatica, right side: Secondary | ICD-10-CM | POA: Insufficient documentation

## 2023-05-02 DIAGNOSIS — M47812 Spondylosis without myelopathy or radiculopathy, cervical region: Secondary | ICD-10-CM | POA: Diagnosis not present

## 2023-05-02 DIAGNOSIS — M5030 Other cervical disc degeneration, unspecified cervical region: Secondary | ICD-10-CM

## 2023-05-02 DIAGNOSIS — G894 Chronic pain syndrome: Secondary | ICD-10-CM | POA: Diagnosis not present

## 2023-05-02 DIAGNOSIS — F1190 Opioid use, unspecified, uncomplicated: Secondary | ICD-10-CM | POA: Insufficient documentation

## 2023-05-02 DIAGNOSIS — G8929 Other chronic pain: Secondary | ICD-10-CM | POA: Diagnosis not present

## 2023-05-02 DIAGNOSIS — M19012 Primary osteoarthritis, left shoulder: Secondary | ICD-10-CM | POA: Diagnosis not present

## 2023-05-02 DIAGNOSIS — M5136 Other intervertebral disc degeneration, lumbar region: Secondary | ICD-10-CM | POA: Diagnosis not present

## 2023-05-02 MED ORDER — HYDROCODONE-ACETAMINOPHEN 5-325 MG PO TABS: 1.0000 | ORAL_TABLET | Freq: Four times a day (QID) | ORAL | 0 refills | Status: AC | PRN

## 2023-05-02 MED ORDER — HYDROCODONE-ACETAMINOPHEN 5-325 MG PO TABS: 1.0000 | ORAL_TABLET | Freq: Four times a day (QID) | ORAL | 0 refills | Status: DC | PRN

## 2023-05-02 NOTE — Progress Notes (Signed)
Nursing Pain Medication Assessment:  Safety precautions to be maintained throughout the outpatient stay will include: orient to surroundings, keep bed in low position, maintain call bell within reach at all times, provide assistance with transfer out of bed and ambulation.  Medication Inspection Compliance: Pill count conducted under aseptic conditions, in front of the patient. Neither the pills nor the bottle was removed from the patient's sight at any time. Once count was completed pills were immediately returned to the patient in their original bottle.  Medication: Hydrocodone/APAP Pill/Patch Count:  38 of 120 pills remain Pill/Patch Appearance: Markings consistent with prescribed medication Bottle Appearance: Standard pharmacy container. Clearly labeled. Filled Date: 04 / 15 / 2024 Last Medication intake:  Today

## 2023-05-15 NOTE — Progress Notes (Signed)
Subjective:  Patient ID: Debra Hogan, female    DOB: [birth year]  Age: 62 y.o. MRN: [id number]  CC: Back Pain   Procedure: None  HPI Kaelyn D Noguez presents for reevaluation.  Her last evaluation was 2 months ago.  She reports that she still having a fair amount of low back pain comparable to what she is experienced historically.  She takes her medications as prescribed and generally gets about 60 to 70% relief.  She uses these about every 4-6 hours with good success.  No side effects reported with the medications.  They enable her to stay functional active and sleep better at night.  She still has her usual pain but he is more able to manage this with the medications.  Outpatient Medications Prior to Visit  Medication Sig Dispense Refill   acyclovir (ZOVIRAX) 400 MG tablet Take 1 tablet (400 mg total) by mouth daily. 90 tablet 3   Albuterol Sulfate (PROAIR RESPICLICK) 108 (90 Base) MCG/ACT AEPB Inhale 1 puff into the lungs every 6 (six) hours as needed (as needed for cough, wheezing). 1 each 5   ALPRAZolam (XANAX) 0.25 MG tablet Take 1 tablet (0.25 mg total) by mouth 2 (two) times daily as needed (fill early 01/13/23). for anxiety 60 tablet 2   aspirin EC 81 MG tablet Take 81 mg by mouth daily.     benzonatate (TESSALON) 100 MG capsule Take 1 capsule (100 mg total) by mouth 2 (two) times daily as needed for cough. 20 capsule 0   cholecalciferol (VITAMIN D) 1000 units tablet Take 1,000 Units by mouth daily.     cyclobenzaprine (FLEXERIL) 10 MG tablet Take 1 tablet (10 mg total) by mouth at bedtime. 30 tablet 4   diltiazem (CARDIZEM CD) 180 MG 24 hr capsule TAKE 1 CAPSULE BY MOUTH ONCE DAILY 90 capsule 3   escitalopram (LEXAPRO) 20 MG tablet Take 1 tablet (20 mg total) by mouth daily. 90 tablet 3   fluticasone-salmeterol (ADVAIR) 250-50 MCG/ACT AEPB INHALE 1 DOSE BY MOUTH TWICE DAILY (AM  AND  BEDTIME) 60 each 3   gabapentin (NEURONTIN) 100 MG capsule Take 1 capsule (100 mg total) by mouth 3  (three) times daily. 270 capsule 3   hydrOXYzine (ATARAX) 10 MG tablet Take 1 tablet by mouth twice daily as needed for anxiety 60 tablet 1   lisinopril (ZESTRIL) 20 MG tablet Take 1 tablet (20 mg total) by mouth daily. 90 tablet 3   Multiple Vitamins-Minerals (CENTRUM SILVER ULTRA WOMENS) TABS Take 1 tablet by mouth daily.     ondansetron (ZOFRAN-ODT) 4 MG disintegrating tablet Take 1 tablet (4 mg total) by mouth every 6 (six) hours as needed for nausea or vomiting. 20 tablet 2   rosuvastatin (CRESTOR) 20 MG tablet Take 1 tablet (20 mg total) by mouth daily. 90 tablet 3   traZODone (DESYREL) 50 MG tablet Take 0.5 tablets (25 mg total) by mouth at bedtime. Start with half tablet and increase to full tablet nightly if needed 90 tablet 3   nitroGLYCERIN (NITROSTAT) 0.4 MG SL tablet Place 1 tablet (0.4 mg total) under the tongue every 5 (five) minutes as needed for chest pain. 25 tablet 3   predniSONE (DELTASONE) 20 MG tablet Take 2 tablets (40 mg total) by mouth daily with breakfast. 10 tablet 0   No facility-administered medications prior to visit.    Review of Systems CNS: No confusion or sedation Cardiac: No angina or palpitations GI: No abdominal pain or constipation Constitutional: No  nausea vomiting fevers or chills  Objective:  BP 131/68   Pulse (!) 106   Temp (!) 96.9 F (36.1 C)   Resp 16   Ht 5\' 5"  (1.651 m)   Wt 154 lb (69.9 kg)   SpO2 94%   BMI 25.63 kg/m    BP Readings from Last 3 Encounters:  05/02/23 131/68  01/12/23 138/88  04/05/22 136/86     Wt Readings from Last 3 Encounters:  05/02/23 154 lb (69.9 kg)  01/12/23 158 lb (71.7 kg)  05/29/22 155 lb (70.3 kg)     Physical Exam Pt is alert and oriented PERRL EOMI HEART IS RRR no murmur or rub LCTA no wheezing or rales MUSCULOSKELETAL reveals some paraspinous muscle tenderness but no overt trigger points.  With extension of the low back with left and right lateral rotation she does have primary  reproduction of her baseline pain.  Lower extremity muscle tone and bulk is at baseline.  Labs  Lab Results  Component Value Date   HGBA1C 5.4 04/05/2022   HGBA1C 5.0 02/08/2021   HGBA1C 5.0 06/18/2020   Lab Results  Component Value Date   LDLCALC 91 06/18/2020   CREATININE 0.95 01/12/2023    -------------------------------------------------------------------------------------------------------------------- Lab Results  Component Value Date   WBC 8.6 04/05/2022   HGB 13.5 04/05/2022   HCT 40.0 04/05/2022   PLT 211.0 04/05/2022   GLUCOSE 88 01/12/2023   CHOL 181 04/05/2022   TRIG 255.0 (H) 04/05/2022   HDL 62.10 04/05/2022   LDLDIRECT 86.0 04/05/2022   LDLCALC 91 06/18/2020   ALT 12 01/12/2023   AST 19 01/12/2023   NA 142 01/12/2023   K 3.9 01/12/2023   CL 99 01/12/2023   CREATININE 0.95 01/12/2023   BUN 19 01/12/2023   CO2 31 01/12/2023   TSH 3.55 04/05/2022   INR 1.0 10/22/2015   HGBA1C 5.4 04/05/2022    --------------------------------------------------------------------------------------------------------------------- CT CHEST LUNG CA SCREEN LOW DOSE W/O CM  Result Date: 04/11/2023 CLINICAL DATA:  Current smoker with 40 pack-year history EXAM: CT CHEST WITHOUT CONTRAST LOW-DOSE FOR LUNG CANCER SCREENING TECHNIQUE: Multidetector CT imaging of the chest was performed following the standard protocol without IV contrast. RADIATION DOSE REDUCTION: This exam was performed according to the departmental dose-optimization program which includes automated exposure control, adjustment of the mA and/or kV according to patient size and/or use of iterative reconstruction technique. COMPARISON:  Lung cancer screening CT dated July 29, 2020 FINDINGS: Cardiovascular: Normal heart size. Small pericardial effusion, similar to prior exam. Normal caliber thoracic aorta with moderate atherosclerotic disease. Mediastinum/Nodes: Esophagus and thyroid are unremarkable. No enlarged lymph nodes  seen in the chest. Lungs/Pleura: Central airways are patent. Severe upper lobe predominant centrilobular emphysema. No consolidation, pleural effusion or pneumothorax. Mild linear opacities of the lung bases which are likely due to scarring or atelectasis. New small solid pulmonary nodules measuring 2.7 mm in the left upper lobe on image 153 and 3 mm in the left lower lobe on image 137. Other previously seen nodules are stable. Upper Abdomen: No acute abnormality. Musculoskeletal: No chest wall mass or suspicious bone lesions identified. IMPRESSION: 1. Lung-RADS 2, benign appearance or behavior. Continue annual screening with low-dose chest CT without contrast in 12 months. 2. Small pericardial effusion, similar to prior exam. 3. Aortic Atherosclerosis (ICD10-I70.0) and Emphysema (ICD10-J43.9). Electronically Signed   By: Allegra Lai M.D.   On: 04/11/2023 12:07    Assessment & Plan:   Ethleen was seen today for back pain.  Diagnoses  and all orders for this visit:  DDD (degenerative disc disease), lumbar  Chronic bilateral low back pain with bilateral sciatica  Chronic pain syndrome  Chronic, continuous use of opioids  Lumbar facet joint syndrome  DDD (degenerative disc disease), cervical  Primary osteoarthritis of left shoulder  Facet arthropathy, cervical  Other orders -     HYDROcodone-acetaminophen (NORCO/VICODIN) 5-325 MG tablet; Take 1 tablet by mouth every 6 (six) hours as needed for moderate pain or severe pain. -     HYDROcodone-acetaminophen (NORCO/VICODIN) 5-325 MG tablet; Take 1 tablet by mouth every 6 (six) hours as needed for moderate pain or severe pain.        ----------------------------------------------------------------------------------------------------------------------  Problem List Items Addressed This Visit       Unprioritized   Chronic pain syndrome   Relevant Medications   HYDROcodone-acetaminophen (NORCO/VICODIN) 5-325 MG tablet    HYDROcodone-acetaminophen (NORCO/VICODIN) 5-325 MG tablet (Start on 06/08/2023)   Chronic, continuous use of opioids   DDD (degenerative disc disease), cervical   Relevant Medications   HYDROcodone-acetaminophen (NORCO/VICODIN) 5-325 MG tablet   HYDROcodone-acetaminophen (NORCO/VICODIN) 5-325 MG tablet (Start on 06/08/2023)   DDD (degenerative disc disease), lumbar - Primary   Relevant Medications   HYDROcodone-acetaminophen (NORCO/VICODIN) 5-325 MG tablet   HYDROcodone-acetaminophen (NORCO/VICODIN) 5-325 MG tablet (Start on 06/08/2023)   Facet arthropathy, cervical   Low back pain   Relevant Medications   HYDROcodone-acetaminophen (NORCO/VICODIN) 5-325 MG tablet   HYDROcodone-acetaminophen (NORCO/VICODIN) 5-325 MG tablet (Start on 06/08/2023)   Lumbar facet joint syndrome   Relevant Medications   HYDROcodone-acetaminophen (NORCO/VICODIN) 5-325 MG tablet   HYDROcodone-acetaminophen (NORCO/VICODIN) 5-325 MG tablet (Start on 06/08/2023)   Osteoarthritis of left shoulder   Relevant Medications   HYDROcodone-acetaminophen (NORCO/VICODIN) 5-325 MG tablet   HYDROcodone-acetaminophen (NORCO/VICODIN) 5-325 MG tablet (Start on 06/08/2023)      ----------------------------------------------------------------------------------------------------------------------  1. DDD (degenerative disc disease), lumbar Continue core stretching strengthening exercises.  Will defer on any interventional injection as she is hesitant to do these but most of her pain seems to be more facet agenic in nature at this point.  Continue with massage and intermittent TENS unit application as necessary.  Continue with current medication management additionally.  2. Chronic bilateral low back pain with bilateral sciatica As per above  3. Chronic pain syndrome I have reviewed the [facility] practitioner database information is appropriate for refill.  Refills will be for Tolen 15 and June 14 with return to clinic in 2  months2  4. Chronic, continuous use of opioids As above  5. Lumbar facet joint syndrome Continue core stretching strengthening  6. DDD (degenerative disc disease), cervical As abov  7. Primary osteoarthritis of left shoulder   8. Facet arthropathy, cervical     ----------------------------------------------------------------------------------------------------------------------  I am having Legna D. Brusca start on HYDROcodone-acetaminophen and HYDROcodone-acetaminophen. I am also having her maintain her Centrum Silver [employer], cholecalciferol, nitroGLYCERIN, aspirin EC, ondansetron, ProAir RespiClick, escitalopram, ALPRAZolam, acyclovir, diltiazem, rosuvastatin, lisinopril, fluticasone-salmeterol, gabapentin, traZODone, predniSONE, benzonatate, and hydrOXYzine.   Meds ordered this encounter  Medications   HYDROcodone-acetaminophen (NORCO/VICODIN) 5-325 MG tablet    Sig: Take 1 tablet by mouth every 6 (six) hours as needed for moderate pain or severe pain.    Dispense:  120 tablet    Refill:  0   HYDROcodone-acetaminophen (NORCO/VICODIN) 5-325 MG tablet    Sig: Take 1 tablet by mouth every 6 (six) hours as needed for moderate pain or severe pain.    Dispense:  120 tablet    Refill:  0   Patient's Medications  New Prescriptions   HYDROCODONE-ACETAMINOPHEN (NORCO/VICODIN) 5-325 MG TABLET    Take 1 tablet by mouth every 6 (six) hours as needed for moderate pain or severe pain.   HYDROCODONE-ACETAMINOPHEN (NORCO/VICODIN) 5-325 MG TABLET    Take 1 tablet by mouth every 6 (six) hours as needed for moderate pain or severe pain.  Previous Medications   ACYCLOVIR (ZOVIRAX) 400 MG TABLET    Take 1 tablet (400 mg total) by mouth daily.   ALBUTEROL SULFATE (PROAIR RESPICLICK) 108 (90 BASE) MCG/ACT AEPB    Inhale 1 puff into the lungs every 6 (six) hours as needed (as needed for cough, wheezing).   ALPRAZOLAM (XANAX) 0.25 MG TABLET    Take 1 tablet (0.25 mg total) by mouth 2 (two)  times daily as needed (fill early 01/13/23). for anxiety   ASPIRIN EC 81 MG TABLET    Take 81 mg by mouth daily.   BENZONATATE (TESSALON) 100 MG CAPSULE    Take 1 capsule (100 mg total) by mouth 2 (two) times daily as needed for cough.   CHOLECALCIFEROL (VITAMIN D) 1000 UNITS TABLET    Take 1,000 Units by mouth daily.   DILTIAZEM (CARDIZEM CD) 180 MG 24 HR CAPSULE    TAKE 1 CAPSULE BY MOUTH ONCE DAILY   ESCITALOPRAM (LEXAPRO) 20 MG TABLET    Take 1 tablet (20 mg total) by mouth daily.   FLUTICASONE-SALMETEROL (ADVAIR) 250-50 MCG/ACT AEPB    INHALE 1 DOSE BY MOUTH TWICE DAILY (AM  AND  BEDTIME)   GABAPENTIN (NEURONTIN) 100 MG CAPSULE    Take 1 capsule (100 mg total) by mouth 3 (three) times daily.   HYDROXYZINE (ATARAX) 10 MG TABLET    Take 1 tablet by mouth twice daily as needed for anxiety   LISINOPRIL (ZESTRIL) 20 MG TABLET    Take 1 tablet (20 mg total) by mouth daily.   MULTIPLE VITAMINS-MINERALS (CENTRUM SILVER ULTRA WOMENS) TABS    Take 1 tablet by mouth daily.   NITROGLYCERIN (NITROSTAT) 0.4 MG SL TABLET    Place 1 tablet (0.4 mg total) under the tongue every 5 (five) minutes as needed for chest pain.   ONDANSETRON (ZOFRAN-ODT) 4 MG DISINTEGRATING TABLET    Take 1 tablet (4 mg total) by mouth every 6 (six) hours as needed for nausea or vomiting.   PREDNISONE (DELTASONE) 20 MG TABLET    Take 2 tablets (40 mg total) by mouth daily with breakfast.   ROSUVASTATIN (CRESTOR) 20 MG TABLET    Take 1 tablet (20 mg total) by mouth daily.   TRAZODONE (DESYREL) 50 MG TABLET    Take 0.5 tablets (25 mg total) by mouth at bedtime. Start with half tablet and increase to full tablet nightly if needed  Modified Medications   No medications on file  Discontinued Medications   No medications on file   ----------------------------------------------------------------------------------------------------------------------  Follow-up: Return in about 2 months (around 07/02/2023) for evaluation, med refill.     Yevette Edwards, MD

## 2023-05-18 ENCOUNTER — Ambulatory Visit: Payer: PPO | Attending: Cardiovascular Disease | Admitting: Cardiovascular Disease

## 2023-05-18 ENCOUNTER — Encounter: Payer: Self-pay | Admitting: Cardiovascular Disease

## 2023-05-18 ENCOUNTER — Other Ambulatory Visit: Payer: Self-pay | Admitting: Cardiology

## 2023-05-18 VITALS — BP 140/60 | HR 99 | Ht 65.0 in | Wt 155.5 lb

## 2023-05-18 DIAGNOSIS — E785 Hyperlipidemia, unspecified: Secondary | ICD-10-CM | POA: Diagnosis not present

## 2023-05-18 DIAGNOSIS — R072 Precordial pain: Secondary | ICD-10-CM | POA: Diagnosis not present

## 2023-05-18 DIAGNOSIS — R0602 Shortness of breath: Secondary | ICD-10-CM | POA: Diagnosis not present

## 2023-05-18 DIAGNOSIS — I272 Pulmonary hypertension, unspecified: Secondary | ICD-10-CM | POA: Diagnosis not present

## 2023-05-18 DIAGNOSIS — I10 Essential (primary) hypertension: Secondary | ICD-10-CM | POA: Diagnosis not present

## 2023-05-18 MED ORDER — NITROGLYCERIN 0.4 MG SL SUBL: 0.4000 mg | SUBLINGUAL_TABLET | SUBLINGUAL | 3 refills | Status: DC | PRN

## 2023-05-18 NOTE — Patient Instructions (Signed)
Medication Instructions:  Nitro has been refilled *If you need a refill on your cardiac medications before your next appointment, please call your pharmacy*   Lab Work: None ordered If you have labs (blood work) drawn today and your tests are completely normal, you will receive your results only by: MyChart Message (if you have MyChart) OR A paper copy in the mail If you have any lab test that is abnormal or we need to change your treatment, we will call you to review the results.   Testing/Procedures: Your physician has requested that you have an echocardiogram. Echocardiography is a painless test that uses sound waves to create images of your heart. It provides your doctor with information about the size and shape of your heart and how well your heart's chambers and valves are working.   You Steffy receive an ultrasound enhancing agent through an IV if needed to better visualize your heart during the echo. This procedure takes approximately one hour.  There are no restrictions for this procedure.  This will take place at [address] [address], [place] [address]  Your provider has ordered a Lexiscan Myoview Stress test. This will take place at [facility]. Please report to the [facility] medical mall entrance. The volunteers at the first desk will direct you where to go.  [facility] MYOVIEW  Your provider has ordered a Stress Test with nuclear imaging. The purpose of this test is to evaluate the blood supply to your heart muscle. This procedure is referred to as a "Non-Invasive Stress Test." This is because other than having an IV started in your vein, nothing is inserted or "invades" your body. Cardiac stress tests are done to find areas of poor blood flow to the heart by determining the extent of coronary artery disease (CAD). Some patients exercise on a treadmill, which naturally increases the blood flow to your heart, while others who are unable to walk on a treadmill due to  physical limitations will have a pharmacologic/chemical stress agent called Lexiscan . This medicine will mimic walking on a treadmill by temporarily increasing your coronary blood flow.   Please note: these test Krisko take anywhere between 2-4 hours to complete  How to prepare for your Myoview test:  Nothing to eat for 6 hours prior to the test No caffeine for 24 hours prior to test No smoking 24 hours prior to test. Your medication Etheredge be taken with water.  If your doctor stopped a medication because of this test, do not take that medication. Ladies, please do not wear dresses.  Skirts or pants are appropriate. Please wear a short sleeve shirt. No perfume, cologne or lotion. Wear comfortable walking shoes. No heels!   PLEASE NOTIFY THE OFFICE AT LEAST 24 HOURS IN ADVANCE IF YOU ARE UNABLE TO KEEP YOUR APPOINTMENT.  [phone] AND  PLEASE NOTIFY NUCLEAR MEDICINE AT [facility] AT LEAST 24 HOURS IN ADVANCE IF YOU ARE UNABLE TO KEEP YOUR APPOINTMENT. [phone]    Follow-Up: At [facility], you and your health needs are our priority.  As part of our continuing mission to provide you with exceptional heart care, we have created designated Provider Care Teams.  These Care Teams include your primary Cardiologist (physician) and Advanced Practice Providers (APPs -  Physician Assistants and Nurse Practitioners) who all work together to provide you with the care you need, when you need it.  We recommend signing up for the patient portal called "MyChart".  Sign up information is provided on  this After Visit Summary.  MyChart is used to connect with patients for Virtual Visits (Telemedicine).  Patients are able to view lab/test results, encounter notes, upcoming appointments, etc.  Non-urgent messages can be sent to your provider as well.   To learn more about what you can do with MyChart, go to [email or web address].    Your next appointment:   Follow up after testing with Dr. Kirke Corin or  APP

## 2023-05-18 NOTE — Progress Notes (Signed)
Cardiology Office Note   Date:  05/18/2023   ID:  Debra Hogan, DOB Mar 13, 1961, MRN [id number]  PCP:  Allegra Grana, FNP  Cardiologist:   Lorine Bears, MD   Chief Complaint  Patient presents with   Follow-up    OD f/u c/o chest pain using nitro. Meds reviewed verbally with pt.      History of Present Illness: Debra Hogan is a 62 y.o. female who is here today for a follow-up regarding chest pain and pulmonary hypertension.  The patient has chronic medical conditions that include fibromyalgia, multiple sclerosis, tobacco use, COPD, degenerative disc disease with chronic back pain and hyperlipidemia.  She has family history of coronary artery disease and she is a smoker.  She is on home oxygen at night. She had CT scan of the lungs in [place] 2019 which shows small pericardial effusion.   She has known history of chest pain and was evaluated in 2019 with a Lexiscan Myoview which showed no evidence of ischemia.  She continued to have symptoms and thus she underwent left heart catheterization in October 2020 which showed mild proximal RCA stenosis with catheter induced spasm that improved with nitroglycerin.  No other obstructive disease.  Right heart catheterization showed mild pulmonary hypertension with moderately elevated RA pressure. She was placed on diltiazem for blood pressure and presumed spasm and had some improvement in symptoms.  She has not been in in our office since 2021. Unfortunately, she continues to smoke 1 pack/day and she is currently on 2 L of oxygen. She reports significant worsening of chest pain over the last 1 year that has worsened over the last few months.  She reports substernal chest pain described as sharp and pressure sensation lasting anywhere from few seconds to 10 minutes.  The symptoms happen at rest but are more likely to happen with exertion.  The pain is usually responsive to sublingual nitroglycerin.  She has been using sublingual nitroglycerin 2-3  times per week. Also, she noticed worsening bilateral leg edema especially on the right side.  Past Medical History:  Diagnosis Date   Anginal pain (HCC)    Arthritis    COPD (chronic obstructive pulmonary disease) (HCC)    Depression    Dyspnea    Fibromyalgia    Headache    Heat stroke 2006   Irritable bowel syndrome    MS (multiple sclerosis) (HCC)    Followed by Dr. Sherryll Burger   Osteoarthritis of left shoulder 01/12/2022    Past Surgical History:  Procedure Laterality Date   ABDOMINAL HYSTERECTOMY     CERVIX intact; had for noncancerous reasons, endometriosis   CHOLECYSTECTOMY N/A 01/03/2016   Procedure: LAPAROSCOPIC CHOLECYSTECTOMY WITH INTRAOPERATIVE CHOLANGIOGRAM;  Surgeon: Lattie Haw, MD;  Location: [facility] ORS;  Service: General;  Laterality: N/A;   ENDOSCOPIC RETROGRADE CHOLANGIOPANCREATOGRAPHY (ERCP) WITH PROPOFOL N/A 01/04/2016   Procedure: ENDOSCOPIC RETROGRADE CHOLANGIOPANCREATOGRAPHY (ERCP) WITH PROPOFOL;  Surgeon: Midge Minium, MD;  Location: [facility] ENDOSCOPY;  Service: Endoscopy;  Laterality: N/A;   FOOT SURGERY  2001   LAPAROSCOPIC ENDOMETRIOSIS FULGURATION     RIGHT/LEFT HEART CATH AND CORONARY ANGIOGRAPHY Bilateral 09/29/2019   Procedure: RIGHT/LEFT HEART CATH AND CORONARY ANGIOGRAPHY;  Surgeon: Yvonne Kendall, MD;  Location: [facility] INVASIVE CV LAB;  Service: Cardiovascular;  Laterality: Bilateral;     Current Outpatient Medications  Medication Sig Dispense Refill   acyclovir (ZOVIRAX) 400 MG tablet Take 1 tablet (400 mg total) by mouth daily. 90 tablet 3   Albuterol Sulfate (  PROAIR RESPICLICK) 108 (90 Base) MCG/ACT AEPB Inhale 1 puff into the lungs every 6 (six) hours as needed (as needed for cough, wheezing). 1 each 5   ALPRAZolam (XANAX) 0.25 MG tablet Take 1 tablet (0.25 mg total) by mouth 2 (two) times daily as needed (fill early 01/13/23). for anxiety 60 tablet 2   aspirin EC 81 MG tablet Take 81 mg by mouth daily.     benzonatate (TESSALON) 100 MG capsule Take 1  capsule (100 mg total) by mouth 2 (two) times daily as needed for cough. 20 capsule 0   cholecalciferol (VITAMIN D) 1000 units tablet Take 1,000 Units by mouth daily.     cyclobenzaprine (FLEXERIL) 10 MG tablet Take 10 mg by mouth at bedtime.     diltiazem (CARDIZEM CD) 180 MG 24 hr capsule TAKE 1 CAPSULE BY MOUTH ONCE DAILY 90 capsule 3   escitalopram (LEXAPRO) 20 MG tablet Take 1 tablet (20 mg total) by mouth daily. 90 tablet 3   fluticasone-salmeterol (ADVAIR) 250-50 MCG/ACT AEPB INHALE 1 DOSE BY MOUTH TWICE DAILY (AM  AND  BEDTIME) 60 each 3   gabapentin (NEURONTIN) 100 MG capsule Take 1 capsule (100 mg total) by mouth 3 (three) times daily. 270 capsule 3   HYDROcodone-acetaminophen (NORCO/VICODIN) 5-325 MG tablet Take 1 tablet by mouth every 6 (six) hours as needed for moderate pain or severe pain. 120 tablet 0   [START ON 06/08/2023] HYDROcodone-acetaminophen (NORCO/VICODIN) 5-325 MG tablet Take 1 tablet by mouth every 6 (six) hours as needed for moderate pain or severe pain. 120 tablet 0   hydrOXYzine (ATARAX) 10 MG tablet Take 1 tablet by mouth twice daily as needed for anxiety 60 tablet 1   lisinopril (ZESTRIL) 20 MG tablet Take 1 tablet (20 mg total) by mouth daily. 90 tablet 3   Multiple Vitamins-Minerals (CENTRUM SILVER ULTRA WOMENS) TABS Take 1 tablet by mouth daily.     nitroGLYCERIN (NITROSTAT) 0.4 MG SL tablet Place 1 tablet (0.4 mg total) under the tongue every 5 (five) minutes as needed for chest pain. 25 tablet 3   ondansetron (ZOFRAN-ODT) 4 MG disintegrating tablet Take 1 tablet (4 mg total) by mouth every 6 (six) hours as needed for nausea or vomiting. 20 tablet 2   rosuvastatin (CRESTOR) 20 MG tablet Take 1 tablet (20 mg total) by mouth daily. 90 tablet 3   traZODone (DESYREL) 50 MG tablet Take 0.5 tablets (25 mg total) by mouth at bedtime. Start with half tablet and increase to full tablet nightly if needed 90 tablet 3   No current facility-administered medications for this  visit.    Allergies:   Valium [diazepam]    Social History:  The patient  reports that she has been smoking cigarettes. She has a 43.00 pack-year smoking history. She has never used smokeless tobacco. She reports that she does not drink alcohol and does not use drugs.   Family History:  The patient's family history includes Arthritis in her mother; Bipolar disorder in her son; Cancer in her father, maternal grandfather, and paternal grandmother; Heart disease in her brother; Hyperlipidemia in her mother; Hypertension in her mother; Schizophrenia in her son.    ROS:  Please see the history of present illness.   Otherwise, review of systems are positive for none.   All other systems are reviewed and negative.    PHYSICAL EXAM: VS:  BP (!) 140/60 (BP Location: Left Arm, Patient Position: Sitting, Cuff Size: Normal)   Pulse 99   Ht 5'  5" (1.651 m)   Wt 155 lb 8 oz (70.5 kg)   SpO2 95%   BMI 25.88 kg/m  , BMI Body mass index is 25.88 kg/m. GEN: Well nourished, well developed, in no acute distress  HEENT: normal  Neck: no JVD, carotid bruits, or masses Cardiac: RRR; no murmurs, rubs, or gallops, mild bilateral leg edema Respiratory: Mildly diminished breath sounds bilaterally GI: soft, nontender, nondistended, + BS MS: no deformity or atrophy  Skin: warm and dry, no rash Neuro:  Strength and sensation are intact Psych: euthymic mood, full affect Radial pulses normal bilaterally.  EKG:  EKG is ordered today. The ekg ordered today demonstrates normal sinus rhythm with low voltage.   Recent Labs: 01/12/2023: ALT 12; BUN 19; Creat 0.95; Potassium 3.9; Sodium 142    Lipid Panel    Component Value Date/Time   CHOL 181 04/05/2022 1608   TRIG 255.0 (H) 04/05/2022 1608   HDL 62.10 04/05/2022 1608   CHOLHDL 3 04/05/2022 1608   VLDL 51.0 (H) 04/05/2022 1608   LDLCALC 91 06/18/2020 1559   LDLDIRECT 86.0 04/05/2022 1608      Wt Readings from Last 3 Encounters:  05/18/23 155 lb 8  oz (70.5 kg)  05/02/23 154 lb (69.9 kg)  01/12/23 158 lb (71.7 kg)          11/28/2018    2:08 PM  PAD Screen  Previous PAD dx? Yes  Previous surgical procedure? No  Pain with walking? Yes  Subsides with rest? No  Feet/toe relief with dangling? Yes  Painful, non-healing ulcers? No  Extremities discolored? No      ASSESSMENT AND PLAN:   1.  Chest pain: Responsive to nitroglycerin with an exertional component worrisome for recurrent angina.  Previous cardiac catheterization in 2020 showed mild proximal RCA stenosis.  I recommend evaluation with Lexiscan Myoview.  She is not able to exercise on a treadmill.  If stress test does not show any significant ischemia, consider adding long-acting nitroglycerin given that her chest pain is usually responsive to sublingual nitroglycerin.  2.   Pulmonary hypertension: She reports worsening exertional dyspnea and lower extremity edema.  I requested a follow-up echocardiogram to evaluate this.  She might require a diuretic.  3.  Tobacco use: I discussed the importance of smoking cessation.  She reports inability to quit.  4.  Essential hypertension: Blood pressure is reasonably controlled on lisinopril and diltiazem.  5.  Hyperlipidemia: Currently on rosuvastatin 20 mg daily.  Most recent lipid profile showed an LDL of 86.    Disposition:   FU with me in 1 month.  Signed,  Lorine Bears, MD  05/18/2023 10:40 AM     [facility]

## 2023-05-22 DIAGNOSIS — H2513 Age-related nuclear cataract, bilateral: Secondary | ICD-10-CM | POA: Diagnosis not present

## 2023-05-24 ENCOUNTER — Encounter: Admission: RE | Admit: 2023-05-24 | Payer: PPO | Source: Ambulatory Visit

## 2023-05-29 ENCOUNTER — Encounter
Admission: RE | Admit: 2023-05-29 | Discharge: 2023-05-29 | Disposition: A | Discharge status: Home / Self Care | Payer: PPO | Source: Ambulatory Visit | Attending: Cardiovascular Disease | Admitting: Cardiovascular Disease

## 2023-05-29 DIAGNOSIS — R072 Precordial pain: Secondary | ICD-10-CM

## 2023-05-29 LAB — NM MYOCAR MULTI W/SPECT W/WALL MOTION / EF
Estimated workload: 1
Exercise duration (min): 0 min
Exercise duration (sec): 0 s
LV dias vol: 19 mL (ref 46–106)
LV sys vol: 5 mL
MPHR: 157 {beats}/min
Nuc Stress EF: 74 %
Peak HR: 121 {beats}/min
Percent HR: 77 %
Rest HR: 82 {beats}/min
Rest Nuclear Isotope Dose: 10.5 mCi
SDS: 0
SRS: 1
SSS: 0
ST Depression (mm): 0 mm
Stress Nuclear Isotope Dose: 32.1 mCi
TID: 0.81

## 2023-05-29 MED ORDER — TECHNETIUM TC 99M TETROFOSMIN IV KIT
10.0000 | PACK | Freq: Once | INTRAVENOUS | Status: AC | PRN
  Administered 2023-05-29: 10.51 via INTRAVENOUS

## 2023-05-29 MED ORDER — REGADENOSON 0.4 MG/5ML IV SOLN
0.4000 mg | Freq: Once | INTRAVENOUS | Status: AC
  Administered 2023-05-29: 0.4 mg via INTRAVENOUS

## 2023-05-29 MED ORDER — TECHNETIUM TC 99M TETROFOSMIN IV KIT
32.0600 | PACK | Freq: Once | INTRAVENOUS | Status: AC | PRN
  Administered 2023-05-29: 32.06 via INTRAVENOUS

## 2023-06-03 ENCOUNTER — Other Ambulatory Visit: Payer: Self-pay | Admitting: Family

## 2023-06-03 DIAGNOSIS — J449 Chronic obstructive pulmonary disease, unspecified: Secondary | ICD-10-CM

## 2023-06-18 ENCOUNTER — Ambulatory Visit: Payer: PPO

## 2023-06-20 NOTE — Progress Notes (Deleted)
Cardiology Office Note    Date:  06/20/2023   ID:  Debra Hogan, DOB December 03, 1961, MRN [id number]  PCP:  Allegra Grana, FNP  Cardiologist:  Lorine Bears, MD  Electrophysiologist:  None   Chief Complaint: Follow-up  History of Present Illness:   Debra Hogan is a 62 y.o. female with history of aortic atherosclerosis, small pericardial effusion noted on CT imaging in 2019, fibromyalgia, multiple sclerosis, COPD with tobacco use, pulmonary hypertension, HLD, nocturnal supplemental oxygen, degenerative disc disease with chronic back pain, and family history of CAD who presents for follow-up of Lexiscan MPI.  She was evaluated for chest pain in 2019 with Lexiscan MPI at that time showing no evidence of ischemia.  She continued to have symptoms and underwent cardiac cath in 09/2019, which showed mild proximal RCA stenosis with catheter induced spasm that improved with nitroglycerin.  Otherwise, there was no obstructive disease.  RHC showed mild pulmonary hypertension with moderately elevated RA pressure.  She was placed on diltiazem for blood pressure & spasm with some improvement in symptoms.  Echo in 04/2020 showed an EF of 60 to 65%, no regional wall motion abnormalities, grade 1 diastolic dysfunction, normal RV systolic function and ventricular cavity size, mildly elevated PASP estimated at 38.8 mmHg, no significant valvular abnormalities, and an estimated right atrial pressure of 3 mmHg.  She was lost to follow-up from 2021 until she was most recently evaluated in 04/2023 for worsening chest pain over the preceding year.  She continues to smoke 1 pack/day and was on 2 L of supplemental oxygen.  Chest pain was described as a sharp and pressure sensation that would last from a few seconds to 10 minutes.  The pain was usually responsive to sublingual nitroglycerin.  She also noted worsening bilateral lower extremity edema, particularly on the right side.  Lexiscan MPI on 05/29/2023 showed no  significant ischemia or scar with an EF greater than 65% and was overall low risk.  There was a small pericardial effusion incidentally noted.  No significant coronary artery calcification on CT attenuation corrected images with aortic atherosclerosis noted.  Echo was ordered and is pending at this time.  ***   Labs independently reviewed: 12/2022 - BUN 19, serum creatinine 0.95, potassium 3.9, albumin 4.3, AST/ALT normal 03/2022 - direct LDL 86, TC 181, TG 255, HDL 62, Hgb 13.5, PLT 211, TSH normal, A1c 5.4  Past Medical History:  Diagnosis Date   Anginal pain (HCC)    Arthritis    COPD (chronic obstructive pulmonary disease) (HCC)    Depression    Dyspnea    Fibromyalgia    Headache    Heat stroke 2006   Irritable bowel syndrome    MS (multiple sclerosis) (HCC)    Followed by Dr. Sherryll Burger   Osteoarthritis of left shoulder 01/12/2022    Past Surgical History:  Procedure Laterality Date   ABDOMINAL HYSTERECTOMY     CERVIX intact; had for noncancerous reasons, endometriosis   CHOLECYSTECTOMY N/A 01/03/2016   Procedure: LAPAROSCOPIC CHOLECYSTECTOMY WITH INTRAOPERATIVE CHOLANGIOGRAM;  Surgeon: Lattie Haw, MD;  Location: [facility] ORS;  Service: General;  Laterality: N/A;   ENDOSCOPIC RETROGRADE CHOLANGIOPANCREATOGRAPHY (ERCP) WITH PROPOFOL N/A 01/04/2016   Procedure: ENDOSCOPIC RETROGRADE CHOLANGIOPANCREATOGRAPHY (ERCP) WITH PROPOFOL;  Surgeon: Midge Minium, MD;  Location: [facility] ENDOSCOPY;  Service: Endoscopy;  Laterality: N/A;   FOOT SURGERY  2001   LAPAROSCOPIC ENDOMETRIOSIS FULGURATION     RIGHT/LEFT HEART CATH AND CORONARY ANGIOGRAPHY Bilateral 09/29/2019   Procedure: RIGHT/LEFT HEART  CATH AND CORONARY ANGIOGRAPHY;  Surgeon: Yvonne Kendall, MD;  Location: [facility] INVASIVE CV LAB;  Service: Cardiovascular;  Laterality: Bilateral;    Current Medications: No outpatient medications have been marked as taking for the 06/22/23 encounter (Appointment) with Sondra Barges, PA-C.    Allergies:    Valium [diazepam]   Social History   Socioeconomic History   Marital status: Married    Spouse name: Not on file   Number of children: Not on file   Years of education: Not on file   Highest education level: Not on file  Occupational History   Not on file  Tobacco Use   Smoking status: Every Day    Packs/day: 1.00    Years: 43.00    Additional pack years: 0.00    Total pack years: 43.00    Types: Cigarettes   Smokeless tobacco: Never  Vaping Use   Vaping Use: Never used  Substance and Sexual Activity   Alcohol use: No    Alcohol/week: 0.0 standard drinks of alcohol   Drug use: No   Sexual activity: Not Currently    Birth control/protection: Post-menopausal  Other Topics Concern   Not on file  Social History Narrative   Lives in [place]. Widowed x 2.      [name] Son died 30-Nov-2022 at 23 years of age.       Married for 14  years.       On disability      Social Determinants of Health   Financial Resource Strain: Low Risk  (05/29/2022)   Overall Financial Resource Strain (CARDIA)    Difficulty of Paying Living Expenses: Not hard at all  Food Insecurity: No Food Insecurity (05/29/2022)   Hunger Vital Sign    Worried About Running Out of Food in the Last Year: Never true    Ran Out of Food in the Last Year: Never true  Transportation Needs: No Transportation Needs (05/29/2022)   PRAPARE - Administrator, Civil Service (Medical): No    Lack of Transportation (Non-Medical): No  Physical Activity: Insufficiently Active (05/29/2022)   Exercise Vital Sign    Days of Exercise per Week: 3 days    Minutes of Exercise per Session: 10 min  Stress: No Stress Concern Present (05/29/2022)   [employer] of Occupational Health - Occupational Stress Questionnaire    Feeling of Stress : Not at all  Social Connections: Unknown (05/29/2022)   Social Connection and Isolation Panel [NHANES]    Frequency of Communication with Friends and Family: Not on file    Frequency of  Social Gatherings with Friends and Family: Not on file    Attends Religious Services: Not on file    Active Member of Clubs or Organizations: Not on file    Attends Banker Meetings: Not on file    Marital Status: Married     Family History:  The patient's family history includes Arthritis in her mother; Bipolar disorder in her son; Cancer in her father, maternal grandfather, and paternal grandmother; Heart disease in her brother; Hyperlipidemia in her mother; Hypertension in her mother; Schizophrenia in her son.  ROS:   12-point review of systems is negative unless otherwise noted in the HPI.   EKGs/Labs/Other Studies Reviewed:    Studies reviewed were summarized above. The additional studies were reviewed today:  Lexiscan MPI 05/29/2023:   Normal pharmacologic myocardial perfusion stress test without significant ischemia or scar.   Left ventricular systolic function is normal (LVEF >  65%).   There is no significant coronary artery calcification noted on the attenuation correction CT.  Aortic atherosclerosis is noted.   Incidental note is made of a small pericardial effusion.   This is a low risk study. __________  2D echo 05/13/2020: 1. Left ventricular ejection fraction, by estimation, is 60 to 65%. The  left ventricle has normal function. The left ventricle has no regional  wall motion abnormalities. Left ventricular diastolic parameters are  consistent with Grade I diastolic  dysfunction (impaired relaxation).   2. Right ventricular systolic function is normal. The right ventricular  size is normal. There is mildly elevated pulmonary artery systolic  pressure.   3. The mitral valve is normal in structure. No evidence of mitral valve  regurgitation. No evidence of mitral stenosis.   4. The aortic valve is normal in structure. Aortic valve regurgitation is  not visualized. No aortic stenosis is present.   5. The inferior vena cava is normal in size with greater  than 50%  respiratory variability, suggesting right atrial pressure of 3 mmHg.   Comparison(s): EF 60-65%.  ___________  [facility] 09/29/2019: Conclusions: No significant atherosclerotic CAD.  Vasospasm noted at the ostial RCA, which improved with IC NTG. Hyperdynamic LVEF with upper normal left heart filling pressures. Mild pulmonary hypertension. Moderately elevated right heart filling pressures. Normal Fick cardiac output/index.   Recommendations: Medical therapy; will start low-dose diltiazem and prn sublingual NTG. Primary prevention of CAD. Continued treatment of underlying pulmonary disease. __________  2D echo 12/23/2018: - Left ventricle: The cavity size was normal. Systolic function was    normal. The estimated ejection fraction was in the range of 60%    to 65%. Wall motion was normal; there were no regional wall    motion abnormalities. Doppler parameters are consistent with    abnormal left ventricular relaxation (grade 1 diastolic    dysfunction).  - Left atrium: The atrium was normal in size.  - Right ventricle: Systolic function was normal.  - Pulmonary arteries: Systolic pressure was moderately elevated. PA    peak pressure: 49 mm Hg (S).  __________  Lexiscan MPI 12/03/2018: Low risk, probably normal pharmacologic myocardial perfusion stress test. There is small in size, mild in severity, fixed apical anterior and apical defect that most likely represent breast attenuation and less likely scar. There is no significant ischemia. The left ventricular ejection fraction is hyperdynamic (>65%) with normal regional wall motion.   EKG:  EKG is ordered today.  The EKG ordered today demonstrates ***  Recent Labs: 01/12/2023: ALT 12; BUN 19; Creat 0.95; Potassium 3.9; Sodium 142  Recent Lipid Panel    Component Value Date/Time   CHOL 181 04/05/2022 1608   TRIG 255.0 (H) 04/05/2022 1608   HDL 62.10 04/05/2022 1608   CHOLHDL 3 04/05/2022 1608   VLDL 51.0 (H)  04/05/2022 1608   LDLCALC 91 06/18/2020 1559   LDLDIRECT 86.0 04/05/2022 1608    PHYSICAL EXAM:    VS:  There were no vitals taken for this visit.  BMI: There is no height or weight on file to calculate BMI.  Physical Exam  Wt Readings from Last 3 Encounters:  05/18/23 155 lb 8 oz (70.5 kg)  05/02/23 154 lb (69.9 kg)  01/12/23 158 lb (71.7 kg)     ASSESSMENT & PLAN:   ***  Pulmonary hypertension:  Pericardial effusion:  HTN: Blood pressure  Aortic atherosclerosis/HLD: LDL 86 in 03/2022.  Tobacco use:   {Are you ordering a CV Procedure (  e.g. stress test, cath, DCCV, TEE, etc)?   Press F2        :[id number]}     Disposition: F/u with Dr. Kirke Corin or an APP in ***.   Medication Adjustments/Labs and Tests Ordered: Current medicines are reviewed at length with the patient today.  Concerns regarding medicines are outlined above. Medication changes, Labs and Tests ordered today are summarized above and listed in the Patient Instructions accessible in Encounters.   Signed, Eula Listen, PA-C 06/20/2023 4:44 PM     [facility] [place] [address] [place], [place] [address] [phone]

## 2023-06-21 ENCOUNTER — Telehealth: Payer: PPO | Admitting: Anesthesiology

## 2023-06-22 ENCOUNTER — Ambulatory Visit: Payer: PPO | Admitting: Physician Assistant

## 2023-06-25 ENCOUNTER — Encounter: Payer: Self-pay | Admitting: Anesthesiology

## 2023-06-25 ENCOUNTER — Ambulatory Visit: Payer: PPO | Attending: Anesthesiology | Admitting: Anesthesiology

## 2023-06-25 DIAGNOSIS — M5441 Lumbago with sciatica, right side: Secondary | ICD-10-CM

## 2023-06-25 DIAGNOSIS — M5030 Other cervical disc degeneration, unspecified cervical region: Secondary | ICD-10-CM | POA: Diagnosis not present

## 2023-06-25 DIAGNOSIS — F1190 Opioid use, unspecified, uncomplicated: Secondary | ICD-10-CM | POA: Diagnosis not present

## 2023-06-25 DIAGNOSIS — M47812 Spondylosis without myelopathy or radiculopathy, cervical region: Secondary | ICD-10-CM

## 2023-06-25 DIAGNOSIS — G8929 Other chronic pain: Secondary | ICD-10-CM

## 2023-06-25 DIAGNOSIS — M19012 Primary osteoarthritis, left shoulder: Secondary | ICD-10-CM

## 2023-06-25 DIAGNOSIS — M5442 Lumbago with sciatica, left side: Secondary | ICD-10-CM | POA: Diagnosis not present

## 2023-06-25 DIAGNOSIS — M5136 Other intervertebral disc degeneration, lumbar region: Secondary | ICD-10-CM

## 2023-06-25 DIAGNOSIS — M47816 Spondylosis without myelopathy or radiculopathy, lumbar region: Secondary | ICD-10-CM | POA: Diagnosis not present

## 2023-06-25 DIAGNOSIS — G894 Chronic pain syndrome: Secondary | ICD-10-CM | POA: Diagnosis not present

## 2023-06-25 MED ORDER — HYDROCODONE-ACETAMINOPHEN 5-325 MG PO TABS: 1.0000 | ORAL_TABLET | ORAL | 0 refills | Status: AC | PRN

## 2023-06-25 MED ORDER — HYDROCODONE-ACETAMINOPHEN 5-325 MG PO TABS: 1.0000 | ORAL_TABLET | ORAL | 0 refills | Status: DC | PRN

## 2023-06-25 NOTE — Progress Notes (Signed)
Virtual Visit via Telephone Note  I connected with Debra Hogan on 06/25/23 at  3:20 PM EDT by telephone and verified that I am speaking with the correct person using two identifiers.  Location: Patient: Home Provider: Pain control center   I discussed the limitations, risks, security and privacy concerns of performing an evaluation and management service by telephone and the availability of in person appointments. I also discussed with the patient that there [place] be a patient responsible charge related to this service. The patient expressed understanding and agreed to proceed.   History of Present Illness: I spoke with Debra Hogan via telephone as we are unable like for the video portion the conference.  She reports that she still in a lot of pain.  She still getting about 50 to 60% relief with the hydrocodone 5 mg tablets.  She takes this 4 times a day.  She has had a lot of back and neck pain and reports that she really only has about 3-4 good days a month.  She is somewhat limited in her ability to have normal activity and this is impacting her sleep at night.  She tolerates medication well when she takes it but is having less relief than in the past.  She is doing her stretching strengthening exercises as best she can.  Otherwise no changes are noted with lower extremity strength function bowel bladder function or the quality of her pain.  Review of systems: General: No fevers or chills Pulmonary: No shortness of breath or dyspnea Cardiac: No angina or palpitations or lightheadedness GI: No abdominal pain or constipation Psych: No depression    Observations/Objective:  Current Outpatient Medications:    [START ON 08/09/2023] HYDROcodone-acetaminophen (NORCO/VICODIN) 5-325 MG tablet, Take 1 tablet by mouth every 4 (four) hours as needed for moderate pain or severe pain., Disp: 130 tablet, Rfl: 0   acyclovir (ZOVIRAX) 400 MG tablet, Take 1 tablet (400 mg total) by mouth daily., Disp: 90  tablet, Rfl: 3   ALPRAZolam (XANAX) 0.25 MG tablet, Take 1 tablet (0.25 mg total) by mouth 2 (two) times daily as needed (fill early 01/13/23). for anxiety, Disp: 60 tablet, Rfl: 2   aspirin EC 81 MG tablet, Take 81 mg by mouth daily., Disp: , Rfl:    benzonatate (TESSALON) 100 MG capsule, Take 1 capsule (100 mg total) by mouth 2 (two) times daily as needed for cough., Disp: 20 capsule, Rfl: 0   cholecalciferol (VITAMIN D) 1000 units tablet, Take 1,000 Units by mouth daily., Disp: , Rfl:    cyclobenzaprine (FLEXERIL) 10 MG tablet, Take 10 mg by mouth at bedtime., Disp: , Rfl:    diltiazem (CARDIZEM CD) 180 MG 24 hr capsule, TAKE 1 CAPSULE BY MOUTH ONCE DAILY, Disp: 90 capsule, Rfl: 3   escitalopram (LEXAPRO) 20 MG tablet, Take 1 tablet (20 mg total) by mouth daily., Disp: 90 tablet, Rfl: 3   fluticasone-salmeterol (ADVAIR) 250-50 MCG/ACT AEPB, INHALE 1 DOSE BY MOUTH TWICE DAILY (AM  AND  BEDTIME), Disp: 60 each, Rfl: 3   gabapentin (NEURONTIN) 100 MG capsule, Take 1 capsule (100 mg total) by mouth 3 (three) times daily., Disp: 270 capsule, Rfl: 3   [START ON 07/10/2023] HYDROcodone-acetaminophen (NORCO/VICODIN) 5-325 MG tablet, Take 1 tablet by mouth every 4 (four) hours as needed for moderate pain or severe pain., Disp: 130 tablet, Rfl: 0   hydrOXYzine (ATARAX) 10 MG tablet, Take 1 tablet by mouth twice daily as needed for anxiety, Disp: 60 tablet, Rfl: 1  lisinopril (ZESTRIL) 20 MG tablet, Take 1 tablet (20 mg total) by mouth daily., Disp: 90 tablet, Rfl: 3   Multiple Vitamins-Minerals (CENTRUM SILVER ULTRA WOMENS) TABS, Take 1 tablet by mouth daily., Disp: , Rfl:    nitroGLYCERIN (NITROSTAT) 0.4 MG SL tablet, Place 1 tablet (0.4 mg total) under the tongue every 5 (five) minutes as needed for chest pain., Disp: 25 tablet, Rfl: 3   ondansetron (ZOFRAN-ODT) 4 MG disintegrating tablet, Take 1 tablet (4 mg total) by mouth every 6 (six) hours as needed for nausea or vomiting., Disp: 20 tablet, Rfl: 2    PROAIR RESPICLICK 108 (90 Base) MCG/ACT AEPB, INHALE 1 PUFF BY MOUTH EVERY 6 HOURS AS NEEDED FOR COUGH, Disp: 1 each, Rfl: 0   rosuvastatin (CRESTOR) 20 MG tablet, Take 1 tablet (20 mg total) by mouth daily., Disp: 90 tablet, Rfl: 3   traZODone (DESYREL) 50 MG tablet, Take 0.5 tablets (25 mg total) by mouth at bedtime. Start with half tablet and increase to full tablet nightly if needed, Disp: 90 tablet, Rfl: 3   Past Medical History:  Diagnosis Date   Anginal pain (HCC)    Arthritis    COPD (chronic obstructive pulmonary disease) (HCC)    Depression    Dyspnea    Fibromyalgia    Headache    Heat stroke 2006   Irritable bowel syndrome    MS (multiple sclerosis) (HCC)    Followed by Dr. Sherryll Burger   Osteoarthritis of left shoulder 01/12/2022     Assessment and Plan: 1. DDD (degenerative disc disease), lumbar   2. Chronic bilateral low back pain with bilateral sciatica   3. Chronic pain syndrome   4. Chronic, continuous use of opioids   5. Lumbar facet joint syndrome   6. DDD (degenerative disc disease), cervical   7. Primary osteoarthritis of left shoulder   8. Facet arthropathy, cervical   Based on our discussion today it is appropriate to add some extra medication to assist her at evening when her pain is the worst as reported today during our discussion.  She has no side effects with the medication as reported today and generally derives good functional benefit from the medicines though her pain has been slightly more intense in the neck and back recently.  I have reviewed the [facility] practitioner database information and it is appropriate to enable her to increase 1 tablet/day if necessary for the evening dose which would put her at 130 tablets/month.  She knows not to take these concomitantly with sedative medications.  She does take Xanax occasionally at nighttime.  She knows to avoid concomitant use.  Will going to change her to an every 4 hour basis and hopefully this will give  her more relief.  Continue current stretching strengthening exercises with return to clinic scheduled in 2 months and continue follow-up with her primary care physician for baseline medical care.  Follow Up Instructions:    I discussed the assessment and treatment plan with the patient. The patient was provided an opportunity to ask questions and all were answered. The patient agreed with the plan and demonstrated an understanding of the instructions.   The patient was advised to call back or seek an in-person evaluation if the symptoms worsen or if the condition fails to improve as anticipated.  I provided 30 minutes of non-face-to-face time during this encounter.   Yevette Edwards, MD

## 2023-06-30 ENCOUNTER — Other Ambulatory Visit: Payer: Self-pay | Admitting: Family

## 2023-06-30 DIAGNOSIS — J449 Chronic obstructive pulmonary disease, unspecified: Secondary | ICD-10-CM

## 2023-07-03 ENCOUNTER — Telehealth: Payer: Self-pay | Admitting: Family

## 2023-07-03 NOTE — Telephone Encounter (Signed)
Copied from CRM [id number]. Topic: Medicare AWV >> Jul 03, 2023  1:58 PM Payton Doughty wrote: Reason for CRM:  LM 07/03/2023 to schedule AWV   Verlee Rossetti; Care Guide Ambulatory Clinical Support [place] [facility] Direct Dial: [phone]

## 2023-08-06 ENCOUNTER — Other Ambulatory Visit: Payer: Self-pay | Admitting: Cardiovascular Disease

## 2023-08-06 DIAGNOSIS — R0602 Shortness of breath: Secondary | ICD-10-CM

## 2023-08-06 DIAGNOSIS — R072 Precordial pain: Secondary | ICD-10-CM

## 2023-08-06 DIAGNOSIS — I10 Essential (primary) hypertension: Secondary | ICD-10-CM

## 2023-08-06 DIAGNOSIS — E785 Hyperlipidemia, unspecified: Secondary | ICD-10-CM

## 2023-08-06 DIAGNOSIS — I272 Pulmonary hypertension, unspecified: Secondary | ICD-10-CM

## 2023-08-13 ENCOUNTER — Ambulatory Visit: Payer: PPO | Attending: Cardiovascular Disease

## 2023-08-15 ENCOUNTER — Telehealth: Payer: Self-pay | Admitting: Family

## 2023-08-15 NOTE — Telephone Encounter (Signed)
Copied from CRM [id number]. Topic: Medicare AWV >> Aug 15, 2023 10:35 AM Payton Doughty wrote: Reason for CRM: LM 08/15/2023 to schedule AWV   Verlee Rossetti; Care Guide Ambulatory Clinical Support [place] [facility] Direct Dial: [phone]

## 2023-08-28 ENCOUNTER — Ambulatory Visit: Payer: PPO | Admitting: Anesthesiology

## 2023-09-04 ENCOUNTER — Ambulatory Visit: Payer: PPO | Attending: Anesthesiology | Admitting: Anesthesiology

## 2023-09-04 ENCOUNTER — Encounter: Payer: Self-pay | Admitting: Anesthesiology

## 2023-09-04 DIAGNOSIS — M47812 Spondylosis without myelopathy or radiculopathy, cervical region: Secondary | ICD-10-CM | POA: Diagnosis not present

## 2023-09-04 DIAGNOSIS — G894 Chronic pain syndrome: Secondary | ICD-10-CM | POA: Diagnosis not present

## 2023-09-04 DIAGNOSIS — M47816 Spondylosis without myelopathy or radiculopathy, lumbar region: Secondary | ICD-10-CM

## 2023-09-04 DIAGNOSIS — M5441 Lumbago with sciatica, right side: Secondary | ICD-10-CM

## 2023-09-04 DIAGNOSIS — M5030 Other cervical disc degeneration, unspecified cervical region: Secondary | ICD-10-CM

## 2023-09-04 DIAGNOSIS — M5136 Other intervertebral disc degeneration, lumbar region: Secondary | ICD-10-CM

## 2023-09-04 DIAGNOSIS — F1190 Opioid use, unspecified, uncomplicated: Secondary | ICD-10-CM

## 2023-09-04 DIAGNOSIS — M19012 Primary osteoarthritis, left shoulder: Secondary | ICD-10-CM | POA: Diagnosis not present

## 2023-09-04 DIAGNOSIS — G8929 Other chronic pain: Secondary | ICD-10-CM

## 2023-09-04 DIAGNOSIS — Z79891 Long term (current) use of opiate analgesic: Secondary | ICD-10-CM | POA: Diagnosis not present

## 2023-09-04 DIAGNOSIS — M5442 Lumbago with sciatica, left side: Secondary | ICD-10-CM

## 2023-09-04 MED ORDER — HYDROCODONE-ACETAMINOPHEN 5-325 MG PO TABS: 1.0000 | ORAL_TABLET | ORAL | 0 refills | Status: AC | PRN

## 2023-09-04 MED ORDER — HYDROCODONE-ACETAMINOPHEN 5-325 MG PO TABS: 1.0000 | ORAL_TABLET | ORAL | 0 refills | Status: DC | PRN

## 2023-09-04 NOTE — Progress Notes (Signed)
Virtual Visit via Telephone Note  I connected with Debra Hogan on 09/04/23 at  1:00 PM EDT by telephone and verified that I am speaking with the correct person using two identifiers.  Location: Patient: Home Provider: Pain control center   I discussed the limitations, risks, security and privacy concerns of performing an evaluation and management service by telephone and the availability of in person appointments. I also discussed with the patient that there [place] be a patient responsible charge related to this service. The patient expressed understanding and agreed to proceed.   History of Present Illness: I spoke with Debra Hogan via telephone as we are unable link for the video portion of the conference.  She reports that the low back pain that she has been experiencing chronically is without any significant change.  No change in the quality characteristic or distribution of the pain are noted.  She is trying to stay active but is reliant on chronic opioid therapy to keep her functional and active.  She does report that over the past week or 2 she has had problems with some constipation and change in stool composition but no heme is noted.  She does feel somewhat bloated.  She has been initiating increase fiber and is preparing to start some suppositories.  She is also dial back on utilization of her opioids.  This has created a scenario where she is more uncomfortable secondary to increased pain.  Otherwise she is in her usual state of health and no new changes in lower extremity strength or bladder function.  Review of systems: General: No fevers or chills Pulmonary: No shortness of breath or dyspnea Cardiac: No angina or palpitations or lightheadedness GI: No abdominal pain or constipation Psych: No depression    Observations/Objective:  Current Outpatient Medications:    [START ON 10/11/2023] HYDROcodone-acetaminophen (NORCO/VICODIN) 5-325 MG tablet, Take 1 tablet by mouth every 4 (four)  hours as needed for moderate pain or severe pain., Disp: 130 tablet, Rfl: 0   acyclovir (ZOVIRAX) 400 MG tablet, Take 1 tablet (400 mg total) by mouth daily., Disp: 90 tablet, Rfl: 3   ALPRAZolam (XANAX) 0.25 MG tablet, Take 1 tablet (0.25 mg total) by mouth 2 (two) times daily as needed (fill early 01/13/23). for anxiety, Disp: 60 tablet, Rfl: 2   aspirin EC 81 MG tablet, Take 81 mg by mouth daily., Disp: , Rfl:    benzonatate (TESSALON) 100 MG capsule, Take 1 capsule (100 mg total) by mouth 2 (two) times daily as needed for cough., Disp: 20 capsule, Rfl: 0   cholecalciferol (VITAMIN D) 1000 units tablet, Take 1,000 Units by mouth daily., Disp: , Rfl:    cyclobenzaprine (FLEXERIL) 10 MG tablet, Take 10 mg by mouth at bedtime., Disp: , Rfl:    diltiazem (CARDIZEM CD) 180 MG 24 hr capsule, TAKE 1 CAPSULE BY MOUTH ONCE DAILY, Disp: 90 capsule, Rfl: 3   escitalopram (LEXAPRO) 20 MG tablet, Take 1 tablet (20 mg total) by mouth daily., Disp: 90 tablet, Rfl: 3   fluticasone-salmeterol (ADVAIR) 250-50 MCG/ACT AEPB, INHALE 1 DOSE BY MOUTH TWICE DAILY (AM  AND  BEDTIME), Disp: 60 each, Rfl: 3   gabapentin (NEURONTIN) 100 MG capsule, Take 1 capsule (100 mg total) by mouth 3 (three) times daily., Disp: 270 capsule, Rfl: 3   [START ON 09/11/2023] HYDROcodone-acetaminophen (NORCO/VICODIN) 5-325 MG tablet, Take 1 tablet by mouth every 4 (four) hours as needed for moderate pain or severe pain., Disp: 130 tablet, Rfl: 0  hydrOXYzine (ATARAX) 10 MG tablet, Take 1 tablet by mouth twice daily as needed for anxiety, Disp: 60 tablet, Rfl: 1   lisinopril (ZESTRIL) 20 MG tablet, Take 1 tablet (20 mg total) by mouth daily., Disp: 90 tablet, Rfl: 3   Multiple Vitamins-Minerals (CENTRUM SILVER ULTRA WOMENS) TABS, Take 1 tablet by mouth daily., Disp: , Rfl:    nitroGLYCERIN (NITROSTAT) 0.4 MG SL tablet, Place 1 tablet (0.4 mg total) under the tongue every 5 (five) minutes as needed for chest pain., Disp: 25 tablet, Rfl: 3    ondansetron (ZOFRAN-ODT) 4 MG disintegrating tablet, Take 1 tablet (4 mg total) by mouth every 6 (six) hours as needed for nausea or vomiting., Disp: 20 tablet, Rfl: 2   PROAIR RESPICLICK 108 (90 Base) MCG/ACT AEPB, INHALE 1 PUFF BY MOUTH EVERY 6 HOURS AS NEEDED FOR COUGH, Disp: 1 each, Rfl: 0   rosuvastatin (CRESTOR) 20 MG tablet, Take 1 tablet (20 mg total) by mouth daily., Disp: 90 tablet, Rfl: 3   traZODone (DESYREL) 50 MG tablet, Take 0.5 tablets (25 mg total) by mouth at bedtime. Start with half tablet and increase to full tablet nightly if needed, Disp: 90 tablet, Rfl: 3   Past Medical History:  Diagnosis Date   Anginal pain (HCC)    Arthritis    COPD (chronic obstructive pulmonary disease) (HCC)    Depression    Dyspnea    Fibromyalgia    Headache    Heat stroke 2006   Irritable bowel syndrome    MS (multiple sclerosis) (HCC)    Followed by Dr. Sherryll Burger   Osteoarthritis of left shoulder 01/12/2022     Assessment and Plan: 1. DDD (degenerative disc disease), lumbar   2. Chronic bilateral low back pain with bilateral sciatica   3. Chronic pain syndrome   4. Chronic, continuous use of opioids   5. Lumbar facet joint syndrome   6. DDD (degenerative disc disease), cervical   7. Primary osteoarthritis of left shoulder   8. Facet arthropathy, cervical   Based on conversation it is appropriate to refill her medicines for the next 2 months this would be dated for September 17 and October 17.  I have reviewed the [facility] practitioner database information is appropriate.  In the meantime I have counseled her to restrict her opioid use to no more than 1 or 2 tablets/day and counsel her pharmacy to add in any additional regimen to help with her constipation.  Furthermore, I have instructed her to touch base with her family practice doctor for additional guidance.  She reports that she did have a colonoscopy 2 years ago and this was within normal limits.  I have requested that she contact  us within the next 2 to 3 days to give [place] an update on her current status.  No other changes in her pharmacologic regimen will be initiated.  Will schedule her for return to clinic in 2 months.  Follow Up Instructions:    I discussed the assessment and treatment plan with the patient. The patient was provided an opportunity to ask questions and all were answered. The patient agreed with the plan and demonstrated an understanding of the instructions.   The patient was advised to call back or seek an in-person evaluation if the symptoms worsen or if the condition fails to improve as anticipated.  I provided 30 minutes of non-face-to-face time during this encounter.   Yevette Edwards, MD

## 2023-09-19 DIAGNOSIS — E785 Hyperlipidemia, unspecified: Secondary | ICD-10-CM | POA: Diagnosis not present

## 2023-09-19 DIAGNOSIS — J439 Emphysema, unspecified: Secondary | ICD-10-CM | POA: Diagnosis not present

## 2023-09-19 DIAGNOSIS — F419 Anxiety disorder, unspecified: Secondary | ICD-10-CM | POA: Diagnosis not present

## 2023-09-19 DIAGNOSIS — F3342 Major depressive disorder, recurrent, in full remission: Secondary | ICD-10-CM | POA: Diagnosis not present

## 2023-09-19 DIAGNOSIS — I10 Essential (primary) hypertension: Secondary | ICD-10-CM | POA: Diagnosis not present

## 2023-09-19 DIAGNOSIS — G35 Multiple sclerosis: Secondary | ICD-10-CM | POA: Diagnosis not present

## 2023-09-19 DIAGNOSIS — G8929 Other chronic pain: Secondary | ICD-10-CM | POA: Diagnosis not present

## 2023-09-19 DIAGNOSIS — Z7982 Long term (current) use of aspirin: Secondary | ICD-10-CM | POA: Diagnosis not present

## 2023-09-25 ENCOUNTER — Telehealth: Payer: Self-pay | Admitting: Family

## 2023-09-25 NOTE — Telephone Encounter (Signed)
Copied from CRM [id number]. Topic: Medicare AWV >> Sep 25, 2023 10:09 AM Payton Doughty wrote: Reason for CRM: Called LM 09/25/2023 to schedule AWV   Verlee Rossetti; Care Guide Ambulatory Clinical Support [place] [facility] Direct Dial: [phone]

## 2023-10-11 ENCOUNTER — Encounter: Payer: PPO | Admitting: Family

## 2023-10-15 ENCOUNTER — Encounter: Payer: PPO | Admitting: Family

## 2023-10-22 ENCOUNTER — Encounter: Payer: Self-pay | Admitting: Cardiovascular Disease

## 2023-10-25 ENCOUNTER — Telehealth: Payer: Self-pay | Admitting: Pulmonary Disease

## 2023-10-25 NOTE — Telephone Encounter (Signed)
Pt needs a oxygen concentrator

## 2023-10-25 NOTE — Telephone Encounter (Signed)
Patient last seen 2019. Patient canceled appt that was scheduled 03/2023(please refer to 04/17/2023 phone note).   Spoke to patient. She stated that she wears cont O2, however her concentrator is broken and she only has e tanks. Pt is aware that an appt is needed prior to ordering oxygen. Appt scheduled 11/12/2023(first available). Advised patient to monitor spo2 and report to ED if spo2 is <88% or if she developed increased SOB.Also recommended that she contact DME company and request that they service concentrator.  She voiced her understanding.  Nothing further needed.

## 2023-10-29 ENCOUNTER — Telehealth: Payer: Self-pay | Admitting: Cardiovascular Disease

## 2023-10-29 NOTE — Telephone Encounter (Signed)
Pt called in asking to sch an echo. No orders have been placed yet, please advise.

## 2023-10-31 ENCOUNTER — Ambulatory Visit: Payer: PPO

## 2023-11-06 ENCOUNTER — Ambulatory Visit: Payer: PPO | Attending: Anesthesiology | Admitting: Anesthesiology

## 2023-11-06 ENCOUNTER — Encounter: Payer: Self-pay | Admitting: Anesthesiology

## 2023-11-06 DIAGNOSIS — M47816 Spondylosis without myelopathy or radiculopathy, lumbar region: Secondary | ICD-10-CM

## 2023-11-06 DIAGNOSIS — M5030 Other cervical disc degeneration, unspecified cervical region: Secondary | ICD-10-CM | POA: Diagnosis not present

## 2023-11-06 DIAGNOSIS — M47812 Spondylosis without myelopathy or radiculopathy, cervical region: Secondary | ICD-10-CM | POA: Diagnosis not present

## 2023-11-06 DIAGNOSIS — Z79891 Long term (current) use of opiate analgesic: Secondary | ICD-10-CM

## 2023-11-06 DIAGNOSIS — M5442 Lumbago with sciatica, left side: Secondary | ICD-10-CM

## 2023-11-06 DIAGNOSIS — M5441 Lumbago with sciatica, right side: Secondary | ICD-10-CM

## 2023-11-06 DIAGNOSIS — F1190 Opioid use, unspecified, uncomplicated: Secondary | ICD-10-CM

## 2023-11-06 DIAGNOSIS — G894 Chronic pain syndrome: Secondary | ICD-10-CM | POA: Diagnosis not present

## 2023-11-06 DIAGNOSIS — M19012 Primary osteoarthritis, left shoulder: Secondary | ICD-10-CM

## 2023-11-06 DIAGNOSIS — G8929 Other chronic pain: Secondary | ICD-10-CM

## 2023-11-06 MED ORDER — HYDROCODONE-ACETAMINOPHEN 5-325 MG PO TABS: 1.0000 | ORAL_TABLET | ORAL | 0 refills | Status: AC | PRN

## 2023-11-06 MED ORDER — CYCLOBENZAPRINE HCL 10 MG PO TABS: 10.0000 mg | ORAL_TABLET | Freq: Every day | ORAL | 5 refills | Status: DC

## 2023-11-06 NOTE — Progress Notes (Signed)
Virtual Visit via Telephone Note  I connected with Debra Hogan on 11/06/23 at  3:20 PM EST by telephone and verified that I am speaking with the correct person using two identifiers.  Location: Patient: Home Provider: Pain control center   I discussed the limitations, risks, security and privacy concerns of performing an evaluation and management service by telephone and the availability of in person appointments. I also discussed with the patient that there [place] be a patient responsible charge related to this service. The patient expressed understanding and agreed to proceed.   History of Present Illness: I spoke with Debra Hogan via telephone for our video conference.  She was not able to do the video portion of this.  She reports that her back pain is stable in nature with no recent changes.  She still has a chronic aching pain in the low back radiates into both hips and sometimes the buttock as well.  She occasionally gets sciatica symptoms.  Sciatic symptoms can affect both posterior lateral legs.  It is worse with prolonged standing and comparable to what she is experienced over the last several years.  She takes her Vicodin approximately every 4 hours for total of 130 tablets/month.  She averages 4 and sometimes 5 tablets/day.  She has had some constipation recently but this is better following a recent trial of Dulcolax suppositories.  The constipation now is much improved.  She has increased her fiber and hydration.  That has helped.  She is trying to stay active.  Otherwise doing her physical therapy exercises and maintains that she gets about 50 to 75% relief with the hydrocodone lasting about 4 to 6 hours before she has recurrence of her same baseline pain which can be incapacitating.  No change in lower extremity strength function or bowel or bladder function is noted.  Review of systems: General: No fevers or chills Pulmonary: No shortness of breath or dyspnea Cardiac: No angina or  palpitations or lightheadedness GI: No abdominal pain or constipation Psych: No depression    Observations/Objective:  Current Outpatient Medications:    [START ON 12/11/2023] HYDROcodone-acetaminophen (NORCO/VICODIN) 5-325 MG tablet, Take 1 tablet by mouth every 4 (four) hours as needed for moderate pain (pain score 4-6) or severe pain (pain score 7-10)., Disp: 130 tablet, Rfl: 0   acyclovir (ZOVIRAX) 400 MG tablet, Take 1 tablet (400 mg total) by mouth daily., Disp: 90 tablet, Rfl: 3   ALPRAZolam (XANAX) 0.25 MG tablet, Take 1 tablet (0.25 mg total) by mouth 2 (two) times daily as needed (fill early 01/13/23). for anxiety, Disp: 60 tablet, Rfl: 2   aspirin EC 81 MG tablet, Take 81 mg by mouth daily., Disp: , Rfl:    benzonatate (TESSALON) 100 MG capsule, Take 1 capsule (100 mg total) by mouth 2 (two) times daily as needed for cough., Disp: 20 capsule, Rfl: 0   cholecalciferol (VITAMIN D) 1000 units tablet, Take 1,000 Units by mouth daily., Disp: , Rfl:    cyclobenzaprine (FLEXERIL) 10 MG tablet, Take 1 tablet (10 mg total) by mouth at bedtime., Disp: 30 tablet, Rfl: 5   diltiazem (CARDIZEM CD) 180 MG 24 hr capsule, TAKE 1 CAPSULE BY MOUTH ONCE DAILY, Disp: 90 capsule, Rfl: 3   escitalopram (LEXAPRO) 20 MG tablet, Take 1 tablet (20 mg total) by mouth daily., Disp: 90 tablet, Rfl: 3   fluticasone-salmeterol (ADVAIR) 250-50 MCG/ACT AEPB, INHALE 1 DOSE BY MOUTH TWICE DAILY (AM  AND  BEDTIME), Disp: 60 each, Rfl: 3  gabapentin (NEURONTIN) 100 MG capsule, Take 1 capsule (100 mg total) by mouth 3 (three) times daily., Disp: 270 capsule, Rfl: 3   [START ON 11/11/2023] HYDROcodone-acetaminophen (NORCO/VICODIN) 5-325 MG tablet, Take 1 tablet by mouth every 4 (four) hours as needed for moderate pain (pain score 4-6) or severe pain (pain score 7-10)., Disp: 130 tablet, Rfl: 0   hydrOXYzine (ATARAX) 10 MG tablet, Take 1 tablet by mouth twice daily as needed for anxiety, Disp: 60 tablet, Rfl: 1   lisinopril  (ZESTRIL) 20 MG tablet, Take 1 tablet (20 mg total) by mouth daily., Disp: 90 tablet, Rfl: 3   Multiple Vitamins-Minerals (CENTRUM SILVER ULTRA WOMENS) TABS, Take 1 tablet by mouth daily., Disp: , Rfl:    nitroGLYCERIN (NITROSTAT) 0.4 MG SL tablet, Place 1 tablet (0.4 mg total) under the tongue every 5 (five) minutes as needed for chest pain., Disp: 25 tablet, Rfl: 3   ondansetron (ZOFRAN-ODT) 4 MG disintegrating tablet, Take 1 tablet (4 mg total) by mouth every 6 (six) hours as needed for nausea or vomiting., Disp: 20 tablet, Rfl: 2   PROAIR RESPICLICK 108 (90 Base) MCG/ACT AEPB, INHALE 1 PUFF BY MOUTH EVERY 6 HOURS AS NEEDED FOR COUGH, Disp: 1 each, Rfl: 0   rosuvastatin (CRESTOR) 20 MG tablet, Take 1 tablet (20 mg total) by mouth daily., Disp: 90 tablet, Rfl: 3   traZODone (DESYREL) 50 MG tablet, Take 0.5 tablets (25 mg total) by mouth at bedtime. Start with half tablet and increase to full tablet nightly if needed, Disp: 90 tablet, Rfl: 3   Past Medical History:  Diagnosis Date   Anginal pain (HCC)    Arthritis    COPD (chronic obstructive pulmonary disease) (HCC)    Depression    Dyspnea    Fibromyalgia    Headache    Heat stroke 2006   Irritable bowel syndrome    MS (multiple sclerosis) (HCC)    Followed by Dr. Sherryll Burger   Osteoarthritis of left shoulder 01/12/2022   Assessment and Plan:  1. Chronic bilateral low back pain with bilateral sciatica   2. Chronic pain syndrome   3. Chronic, continuous use of opioids   4. Lumbar facet joint syndrome   5. DDD (degenerative disc disease), cervical   6. Primary osteoarthritis of left shoulder   7. Facet arthropathy, cervical    Based our conversation I gets appropriate to refill her medicines for the next 2 months dated for November 17 and December 17.  No other changes in her regimen will be initiated.  We did talk about the potential for Flexeril increasing her risk for constipation especially on chronic opioids.  She [place] consider cutting  this to the 5 mg dose at bedtime or taking this every other day.  Continue hydration and fiber for the constipation continue physical therapy exercises.  I have reviewed the [facility] practitioner database information and it is appropriate for refill.  Will schedule her for return to clinic in 2 months with continue follow-up with her primary care physicians for baseline medical care. Follow Up Instructions:    I discussed the assessment and treatment plan with the patient. The patient was provided an opportunity to ask questions and all were answered. The patient agreed with the plan and demonstrated an understanding of the instructions.   The patient was advised to call back or seek an in-person evaluation if the symptoms worsen or if the condition fails to improve as anticipated.  I provided 30 minutes of non-face-to-face time  during this encounter.   Yevette Edwards, MD

## 2023-11-09 ENCOUNTER — Other Ambulatory Visit: Payer: Self-pay | Admitting: Family

## 2023-11-09 DIAGNOSIS — F32A Depression, unspecified: Secondary | ICD-10-CM

## 2023-11-12 NOTE — Telephone Encounter (Signed)
Call patient Chart is unclear.  Did She request a refill of Xanax?   Last seen 01/12/23  Please also sch f/u appt with me

## 2023-11-13 ENCOUNTER — Other Ambulatory Visit: Payer: Self-pay | Admitting: Family

## 2023-11-13 ENCOUNTER — Institutional Professional Consult (permissible substitution): Payer: PPO | Admitting: Student in an Organized Health Care Education/Training Program

## 2023-11-13 DIAGNOSIS — F32A Depression, unspecified: Secondary | ICD-10-CM

## 2023-11-13 MED ORDER — ALPRAZOLAM 0.25 MG PO TABS: 0.2500 mg | ORAL_TABLET | Freq: Every day | ORAL | 0 refills | Status: DC | PRN

## 2023-11-13 NOTE — Progress Notes (Signed)
close

## 2023-11-16 ENCOUNTER — Ambulatory Visit: Payer: PPO | Attending: Cardiovascular Disease

## 2023-11-16 DIAGNOSIS — R072 Precordial pain: Secondary | ICD-10-CM | POA: Diagnosis not present

## 2023-11-16 DIAGNOSIS — R0602 Shortness of breath: Secondary | ICD-10-CM

## 2023-11-16 LAB — ECHOCARDIOGRAM COMPLETE
AR max vel: 2.68 cm2
AV Area VTI: 2.52 cm2
AV Area mean vel: 2.58 cm2
AV Mean grad: 3 mm[Hg]
AV Peak grad: 5.8 mm[Hg]
Ao pk vel: 1.2 m/s
Area-P 1/2: 4.57 cm2
Calc EF: 51.3 %
S' Lateral: 2.3 cm
Single Plane A2C EF: 54.4 %
Single Plane A4C EF: 52.9 %

## 2023-11-20 ENCOUNTER — Other Ambulatory Visit: Payer: Self-pay | Admitting: Family

## 2023-11-20 DIAGNOSIS — J449 Chronic obstructive pulmonary disease, unspecified: Secondary | ICD-10-CM

## 2023-11-21 ENCOUNTER — Ambulatory Visit: Payer: PPO | Admitting: Cardiovascular Disease

## 2023-11-29 ENCOUNTER — Ambulatory Visit: Payer: PPO | Admitting: Family

## 2023-12-10 ENCOUNTER — Encounter: Payer: Self-pay | Admitting: Physician Assistant

## 2023-12-10 ENCOUNTER — Ambulatory Visit: Payer: PPO | Attending: Physician Assistant | Admitting: Emergency Medicine

## 2023-12-10 VITALS — BP 124/68 | HR 81 | Ht 65.0 in | Wt 153.6 lb

## 2023-12-10 DIAGNOSIS — I3139 Other pericardial effusion (noninflammatory): Secondary | ICD-10-CM | POA: Diagnosis not present

## 2023-12-10 DIAGNOSIS — I25118 Atherosclerotic heart disease of native coronary artery with other forms of angina pectoris: Secondary | ICD-10-CM | POA: Diagnosis not present

## 2023-12-10 DIAGNOSIS — E785 Hyperlipidemia, unspecified: Secondary | ICD-10-CM

## 2023-12-10 DIAGNOSIS — I272 Pulmonary hypertension, unspecified: Secondary | ICD-10-CM | POA: Diagnosis not present

## 2023-12-10 DIAGNOSIS — I10 Essential (primary) hypertension: Secondary | ICD-10-CM

## 2023-12-10 DIAGNOSIS — R072 Precordial pain: Secondary | ICD-10-CM | POA: Diagnosis not present

## 2023-12-10 DIAGNOSIS — J439 Emphysema, unspecified: Secondary | ICD-10-CM

## 2023-12-10 DIAGNOSIS — F17200 Nicotine dependence, unspecified, uncomplicated: Secondary | ICD-10-CM

## 2023-12-10 MED ORDER — ISOSORBIDE MONONITRATE ER 30 MG PO TB24: 30.0000 mg | ORAL_TABLET | Freq: Every day | ORAL | 3 refills | Status: DC

## 2023-12-10 MED ORDER — NITROGLYCERIN 0.4 MG SL SUBL: 0.4000 mg | SUBLINGUAL_TABLET | SUBLINGUAL | 3 refills | Status: AC | PRN

## 2023-12-10 NOTE — Progress Notes (Addendum)
Cardiology Office Note:    Date:  12/10/2023  ID:  Debra Hogan, DOB Ingalls 16, 1962, MRN [id number] PCP: Allegra Grana, FNP  [facility] Providers Cardiologist:  Lorine Bears, MD       Patient Profile:      Debra Hogan. Mccullum is a 62 year old female with visit pertinent history of fibromyalgia, HTN, CAD, multiple sclerosis, tobacco use, COPD, degenerative disc disease, hyperlipidemia, trivial mitral valve regurgitation, aortic atherosclerosis.  Lexiscan Myoview 2019 showed no evidence of ischemia.  She underwent a left heart catheterization in October 2020 which showed mild proximal RCA stenosis with catheter induced spasm that improved with nitroglycerin, no other obstructive disease.  Right heart catheterization showed mild pulmonary hypertension with moderately elevated RA pressure.  She was placed on diltiazem for blood pressure control and presumed spasm and had some improvement in her symptoms.  Was last seen by Dr. Kirke Corin 05/18/2023 where she reported ongoing / worsening chest pain and dyspnea over the past year.  She was currently smoking 1 pack/day and she is on 2 L of oxygen.  Lexiscan Myoview ordered and completed on 05/18/2023 showing no evidence of significant ischemia or scar and noted to be a low risk study.  Echocardiogram completed 11/16/2023 showing LVEF 65-70%, no RWMA, grade 1 DD, RV SF normal, small pericardial effusion is present, trivial mitral valve regurgitation,.      History of Present Illness:  Debra Hogan is a 62 y.o. female who returns for 72-month follow-up.  Patient is doing well overall from a cardiac standpoint.  She notes that she has had ongoing chest pain for several years now.  She notes 3-4 episodes per week without any identified aggravating factors.  She notes the chest pain will sometimes occur at rest but also when she is up exerting herself.  Her episodes typically last 20 minutes but are responsive to nitroglycerin.  The pains are typically  left-sided with no radiation to neck, shoulders, arm.  She is currently on O2 at all times for COPD/emphysema.  She notes that she gets severely short of breath when she takes her oxygen off and begins to walk.  She notes that she continues to smoke 1 pack/day and has tried stopping in the past without any luck.  She has experienced orthopnea as well that has been stable.  She denies chest pain, shortness of breath, lower extremity edema, fatigue, palpitations, melena, hematuria, hemoptysis, diaphoresis, weakness, presyncope, syncope, orthopnea, and PND.        Review of Systems  Constitutional: Negative for weight gain and weight loss.  Cardiovascular:  Positive for chest pain and dyspnea on exertion. Negative for claudication, cyanosis, irregular heartbeat, leg swelling, near-syncope, orthopnea, palpitations, paroxysmal nocturnal dyspnea and syncope.  Respiratory:  Positive for cough and shortness of breath. Negative for hemoptysis.   Gastrointestinal:  Negative for abdominal pain, hematochezia and melena.  Genitourinary:  Negative for hematuria.     See HPI     Studies Reviewed:   EKG Interpretation Date/Time:  Monday December 10 2023 15:48:41 EST Ventricular Rate:  81 PR Interval:  158 QRS Duration:  76 QT Interval:  368 QTC Calculation: 427 R Axis:   72  Text Interpretation: Normal sinus rhythm with sinus arrhythmia Normal ECG Confirmed by Rise Paganini [phone]) on 12/10/2023 5:30:24 PM    Echocardiogram 11/16/2023  1. Left ventricular ejection fraction, by estimation, is 65 to 70%. The  left ventricle has normal function. The left ventricle has no regional  wall motion  abnormalities. Left ventricular diastolic parameters are  consistent with Grade I diastolic  dysfunction (impaired relaxation).   2. Right ventricular systolic function is normal. The right ventricular  size is normal.   3. A small pericardial effusion is present. The pericardial effusion is   circumferential. There is no evidence of cardiac tamponade.   4. The mitral valve is normal in structure. Trivial mitral valve  regurgitation.   5. The aortic valve is tricuspid. Aortic valve regurgitation is not  visualized.   6. The inferior vena cava is normal in size with <50% respiratory  variability, suggesting right atrial pressure of 8 mmHg.   Lexiscan Stress Test 05/18/2023   Normal pharmacologic myocardial perfusion stress test without significant ischemia or scar.   Left ventricular systolic function is normal (LVEF > 65%).   There is no significant coronary artery calcification noted on the attenuation correction CT.  Aortic atherosclerosis is noted.   Incidental note is made of a small pericardial effusion.   This is a low risk study.  R/L Heart Cath 09/29/2019 No significant atherosclerotic CAD.  Vasospasm noted at the ostial RCA, which improved with IC NTG. Hyperdynamic LVEF with upper normal left heart filling pressures. Mild pulmonary hypertension. Moderately elevated right heart filling pressures. Normal Fick cardiac output/index. Diagnostic Dominance: Right  Risk Assessment/Calculations:             Physical Exam:   VS:  BP 124/68 (BP Location: Left Arm, Patient Position: Sitting, Cuff Size: Normal)   Pulse 81   Ht 5\' 5"  (1.651 m)   Wt 153 lb 9.6 oz (69.7 kg)   SpO2 91%   BMI 25.56 kg/m    Wt Readings from Last 3 Encounters:  12/10/23 153 lb 9.6 oz (69.7 kg)  05/18/23 155 lb 8 oz (70.5 kg)  05/02/23 154 lb (69.9 kg)    Constitutional:      Appearance: Normal and healthy appearance.  HENT:     Head: Normocephalic.  Neck:     Vascular: JVD normal.  Pulmonary:     Effort: Pulmonary effort is normal.     Breath sounds: Normal breath sounds.  Chest:     Chest wall: Not tender to palpatation.  Cardiovascular:     PMI at left midclavicular line. Normal rate. Regular rhythm. Normal S1. Normal S2.      Murmurs: There is no murmur.     No gallop.  No  click. No rub.  Pulses:    Intact distal pulses.  Edema:    Peripheral edema absent.  Musculoskeletal: Normal range of motion.     Cervical back: Normal range of motion and neck supple. Skin:    General: Skin is warm and dry.  Neurological:     General: No focal deficit present.     Mental Status: Alert, oriented to person, place, and time and oriented to person, place and time.  Psychiatric:        Attention and Perception: Attention and perception normal.        Mood and Affect: Mood normal.        Behavior: Behavior is cooperative.        Thought Content: Thought content normal.        Assessment and Plan:  Precordial pain -Previous cardiac catheterization 2020 showed mild proximal RCA stenosis, echocardiogram 11/14/2023 with normal LVEF, and Lexiscan MPI on 05/18/2023 showing a low risk study without significant ischemia or scar.  She continues to have atypical chest pain that can be  brought on by rest and/or exertion that she notes no significant changes over the last several years.  Pain typically last 20 minutes with no radiation and are responsive to x 1 sublingual nitroglycerin.  Of note she did have a small pericardial effusion noted on echocardiogram 11/16/2023.  Plan to start isosorbide monohydrate 30 mg once daily given her CP is nitroglycerin responsive and limited echocardiogram to reassess pericardial effusion size.  Plan for CBC and CMP today as last available lab work was greater than 1 year ago.  I have refilled her nitroglycerin tabs. Will have her follow back in 1 month.   Essential hypertension -Blood pressure today 124/68 and well-controlled.  Continue Cardizem CD 180 mg once daily and lisinopril 20 mg once daily.  Will have her follow blood pressure at home given addition of Imdur 30 mg.  Encouraged heart healthy dieting.  Coronary artery disease / Aortic atherosclerosis / Hyperlipidemia, LDL goal <70 -Direct LDL 86 on 03/2022 and history of mild proximal RCA stenosis  per cath 2020.  Her LDL is currently above goal however has not been updated since early 2023.  Will update lipid panel today and we agreed to add ezetimibe 10 mg daily if she is not at goal.  Continue rosuvastatin 20 mg once daily and aspirin EC 81 mg once daily.  Pericardial effusion -Small pericardial effusion present on echocardiogram 11/16/2023.  Plan for limited echocardiogram to reassess resolution or progression of pericardial effusion  Pulmonary hypertension / Emphysema -She is currently on home O2.  She notes stable exertional dyspnea and improve lower extremity edema.  She does not require a diuretic at this time given unremarkable echocardiogram 10/2023.  Continue to follow with pulmonology.  Tobacco use -She continues to smoke, has tried to quit in the past.  I discussed the importance of smoking cessation.  I recommended she speak to a therapist regarding cessation and to recognize her daily triggers.              Dispo:  Return in about 1 month (around 01/10/2024).  Signed, Denyce Robert, NP

## 2023-12-10 NOTE — Patient Instructions (Signed)
Medication Instructions:  Your physician recommends the following medication changes.  START TAKING: Imdur 30 mg daily  *If you need a refill on your cardiac medications before your next appointment, please call your pharmacy*   Lab Work: Your provider would like for you to return in 1 day to have the following labs drawn: CBC, CMP, Lipid panel.   Please go to [facility] [address] ([facility] [address], [place] [address] You do not need an appointment.  They are open from 7:30 am-4 pm.  Lunch from 1:00 pm- 2:00 pm You DO  need to be fasting.   If you have labs (blood work) drawn today and your tests are completely normal, you will receive your results only by: MyChart Message (if you have MyChart) OR A paper copy in the mail If you have any lab test that is abnormal or we need to change your treatment, we will call you to review the results.   Testing/Procedures: Your physician has requested that you have an Limited Echocardiogram. Echocardiography is a painless test that uses sound waves to create images of your heart. It provides your doctor with information about the size and shape of your heart and how well your heart's chambers and valves are working.   You Contee receive an ultrasound enhancing agent through an IV if needed to better visualize your heart during the echo. This procedure takes approximately one hour.  There are no restrictions for this procedure.  This will take place at [address] [facility]) [address], [place] [address]  Please note: We ask at that you not bring children with you during ultrasound (echo/ vascular) testing. Due to room size and safety concerns, children are not allowed in the ultrasound rooms during exams. Our front office staff cannot provide observation of children in our lobby area while testing is being conducted. An adult accompanying a patient to their appointment will only be allowed in the  ultrasound room at the discretion of the ultrasound technician under special circumstances. We apologize for any inconvenience.    Follow-Up: At [facility], you and your health needs are our priority.  As part of our continuing mission to provide you with exceptional heart care, we have created designated Provider Care Teams.  These Care Teams include your primary Cardiologist (physician) and Advanced Practice Providers (APPs -  Physician Assistants and Nurse Practitioners) who all work together to provide you with the care you need, when you need it.  We recommend signing up for the patient portal called "MyChart".  Sign up information is provided on this After Visit Summary.  MyChart is used to connect with patients for Virtual Visits (Telemedicine).  Patients are able to view lab/test results, encounter notes, upcoming appointments, etc.  Non-urgent messages can be sent to your provider as well.   To learn more about what you can do with MyChart, go to [email or web address].    Your next appointment:   1 month(s)  Provider:   You Schmale see Lorine Bears, MD or one of the following Advanced Practice Providers on your designated Care Team:   Eula Listen, [place]

## 2023-12-12 ENCOUNTER — Ambulatory Visit: Payer: PPO | Admitting: Student in an Organized Health Care Education/Training Program

## 2023-12-12 ENCOUNTER — Encounter: Payer: Self-pay | Admitting: Student in an Organized Health Care Education/Training Program

## 2023-12-12 VITALS — BP 116/84 | HR 94 | Temp 97.6°F | Ht 65.0 in | Wt 155.0 lb

## 2023-12-12 DIAGNOSIS — J9611 Chronic respiratory failure with hypoxia: Secondary | ICD-10-CM | POA: Diagnosis not present

## 2023-12-12 DIAGNOSIS — J449 Chronic obstructive pulmonary disease, unspecified: Secondary | ICD-10-CM | POA: Diagnosis not present

## 2023-12-12 DIAGNOSIS — Z87891 Personal history of nicotine dependence: Secondary | ICD-10-CM

## 2023-12-12 MED ORDER — NICOTINE POLACRILEX 2 MG MT LOZG: 2.0000 mg | LOZENGE | OROMUCOSAL | 3 refills | Status: AC | PRN

## 2023-12-12 MED ORDER — SPIRIVA RESPIMAT 2.5 MCG/ACT IN AERS: 2.0000 | INHALATION_SPRAY | Freq: Every day | RESPIRATORY_TRACT | 12 refills | Status: DC

## 2023-12-12 MED ORDER — NICOTINE 14 MG/24HR TD PT24: 14.0000 mg | MEDICATED_PATCH | TRANSDERMAL | 0 refills | Status: AC

## 2023-12-12 MED ORDER — NICOTINE 7 MG/24HR TD PT24: 7.0000 mg | MEDICATED_PATCH | TRANSDERMAL | 0 refills | Status: AC

## 2023-12-12 MED ORDER — NICOTINE 21 MG/24HR TD PT24: 21.0000 mg | MEDICATED_PATCH | TRANSDERMAL | 0 refills | Status: AC

## 2023-12-12 NOTE — Progress Notes (Signed)
Synopsis: Referred in for shortnes of breath by Debra Grana, FNP  Assessment & Plan:   #COPD (GOLD 2B) #Chronic hypoxic respiratory failure  She has longstanding history of COPD secondary to her extensive smoking history as well as work in the [employer]. Chest CT from 2024 showed significant centrilobular emphysema. She unfortunately continues to smoke. No PFT's on file but does have in office spirometry from 2019 showing FEV1/FVC of 0.58 and FEV1 of 1.44 L (55% predicted). Patient presents to clinic on oxygen therapy, and has multiple bottles of oxygen which is very limiting to her ability to ambulate and be active in the community.  Discussed plan for holistic management of her COPD. Explained that pulmonary rehabilitation will be extremely important for her exercise capacity as well as quality of life. She is taking care of her 64 year old mother and is unable to participate in person. Discussed tele-rehab, and she is willing to partake. Also discussed medication, will add LAMA to her regimen for triple therapy effect. I will also send an oxygen concentrator to help with her mobility and quality of life. We will re-assess oxygen requirements on follow up visit. Finally, I will be ordering repeat PFT's to re-evaluate the degree of obstruction.  I have personally reviewed her chest CT and note the severity of the emphysema in the upper lobes, more so in the RUL. Options for Debra Hogan, should she quit smoking, would include endobronchial valve placement for EBLVR as well as consideration for lung transplantation at [facility]. We will discuss these in the future.  - Pulmonary Function Test [facility] Only; Future - AMB referral to pulmonary rehabilitation - Tiotropium Bromide Monohydrate (SPIRIVA RESPIMAT) 2.5 MCG/ACT AERS; Inhale 2 puffs into the lungs daily.  Dispense: 60 each; Refill: 12 - AMB REFERRAL FOR DME - continue Wixela  3. Personal history of tobacco use, presenting hazards to  health  Discussed how imperative smoking cessation will be to her health and to slow down her respiratory failure. Patient is will to consider cessation. Provided her with information on cessation and will send it prescriptions for patches and lozenges.  - nicotine (NICODERM CQ - DOSED IN MG/24 HOURS) 21 mg/24hr patch; Place 1 patch (21 mg total) onto the skin daily.  Dispense: 42 patch; Refill: 0 - nicotine (NICODERM CQ - DOSED IN MG/24 HOURS) 14 mg/24hr patch; Place 1 patch (14 mg total) onto the skin daily for 14 days.  Dispense: 14 patch; Refill: 0 - nicotine (NICODERM CQ - DOSED IN MG/24 HR) 7 mg/24hr patch; Place 1 patch (7 mg total) onto the skin daily for 14 days.  Dispense: 14 patch; Refill: 0 - nicotine polacrilex (NICOTINE MINI) 2 MG lozenge; Take 1 lozenge (2 mg total) by mouth every 2 (two) hours as needed for smoking cessation.  Dispense: 72 lozenge; Refill: 3   Return in about 6 months (around 06/11/2024).  I spent 62 minutes caring for this patient today, including preparing to see the patient, obtaining a medical history , reviewing a separately obtained history, performing a medically appropriate examination and/or evaluation, counseling and educating the patient/family/caregiver, ordering medications, tests, or procedures, documenting clinical information in the electronic health record, and independently interpreting results (not separately reported/billed) and communicating results to the patient/family/caregiver  Debra Chute, MD [facility]  End of visit medications:  Meds ordered this encounter  Medications   Tiotropium Bromide Monohydrate (SPIRIVA RESPIMAT) 2.5 MCG/ACT AERS    Sig: Inhale 2 puffs into the lungs daily.  Dispense:  60 each    Refill:  12   nicotine (NICODERM CQ - DOSED IN MG/24 HOURS) 21 mg/24hr patch    Sig: Place 1 patch (21 mg total) onto the skin daily.    Dispense:  42 patch    Refill:  0   nicotine (NICODERM CQ - DOSED  IN MG/24 HOURS) 14 mg/24hr patch    Sig: Place 1 patch (14 mg total) onto the skin daily for 14 days.    Dispense:  14 patch    Refill:  0   nicotine (NICODERM CQ - DOSED IN MG/24 HR) 7 mg/24hr patch    Sig: Place 1 patch (7 mg total) onto the skin daily for 14 days.    Dispense:  14 patch    Refill:  0   nicotine polacrilex (NICOTINE MINI) 2 MG lozenge    Sig: Take 1 lozenge (2 mg total) by mouth every 2 (two) hours as needed for smoking cessation.    Dispense:  72 lozenge    Refill:  3     Current Outpatient Medications:    acyclovir (ZOVIRAX) 400 MG tablet, Take 1 tablet (400 mg total) by mouth daily., Disp: 90 tablet, Rfl: 3   ALPRAZolam (XANAX) 0.25 MG tablet, Take 1 tablet (0.25 mg total) by mouth daily as needed. for anxiety, Disp: 30 tablet, Rfl: 0   aspirin EC 81 MG tablet, Take 81 mg by mouth daily., Disp: , Rfl:    benzonatate (TESSALON) 100 MG capsule, Take 1 capsule (100 mg total) by mouth 2 (two) times daily as needed for cough., Disp: 20 capsule, Rfl: 0   cholecalciferol (VITAMIN D) 1000 units tablet, Take 1,000 Units by mouth daily., Disp: , Rfl:    cyclobenzaprine (FLEXERIL) 10 MG tablet, Take 1 tablet (10 mg total) by mouth at bedtime., Disp: 30 tablet, Rfl: 5   diltiazem (CARDIZEM CD) 180 MG 24 hr capsule, TAKE 1 CAPSULE BY MOUTH ONCE DAILY, Disp: 90 capsule, Rfl: 3   escitalopram (LEXAPRO) 20 MG tablet, Take 1 tablet (20 mg total) by mouth daily., Disp: 90 tablet, Rfl: 3   fluticasone-salmeterol (ADVAIR) 250-50 MCG/ACT AEPB, INHALE 1 DOSE BY MOUTH TWICE DAILY AM  AND  BEDTIME, Disp: 60 each, Rfl: 0   gabapentin (NEURONTIN) 100 MG capsule, Take 1 capsule (100 mg total) by mouth 3 (three) times daily., Disp: 270 capsule, Rfl: 3   HYDROcodone-acetaminophen (NORCO/VICODIN) 5-325 MG tablet, Take 1 tablet by mouth every 4 (four) hours as needed for moderate pain (pain score 4-6) or severe pain (pain score 7-10)., Disp: 130 tablet, Rfl: 0   isosorbide mononitrate (IMDUR) 30 MG  24 hr tablet, Take 1 tablet (30 mg total) by mouth daily., Disp: 90 tablet, Rfl: 3   lisinopril (ZESTRIL) 20 MG tablet, Take 1 tablet (20 mg total) by mouth daily., Disp: 90 tablet, Rfl: 3   Multiple Vitamins-Minerals (CENTRUM SILVER ULTRA WOMENS) TABS, Take 1 tablet by mouth daily., Disp: , Rfl:    nicotine (NICODERM CQ - DOSED IN MG/24 HOURS) 14 mg/24hr patch, Place 1 patch (14 mg total) onto the skin daily for 14 days., Disp: 14 patch, Rfl: 0   nicotine (NICODERM CQ - DOSED IN MG/24 HOURS) 21 mg/24hr patch, Place 1 patch (21 mg total) onto the skin daily., Disp: 42 patch, Rfl: 0   nicotine (NICODERM CQ - DOSED IN MG/24 HR) 7 mg/24hr patch, Place 1 patch (7 mg total) onto the skin daily for 14 days., Disp: 14 patch, Rfl: 0  nicotine polacrilex (NICOTINE MINI) 2 MG lozenge, Take 1 lozenge (2 mg total) by mouth every 2 (two) hours as needed for smoking cessation., Disp: 72 lozenge, Rfl: 3   nitroGLYCERIN (NITROSTAT) 0.4 MG SL tablet, Place 1 tablet (0.4 mg total) under the tongue every 5 (five) minutes as needed for chest pain., Disp: 25 tablet, Rfl: 3   ondansetron (ZOFRAN-ODT) 4 MG disintegrating tablet, Take 1 tablet (4 mg total) by mouth every 6 (six) hours as needed for nausea or vomiting., Disp: 20 tablet, Rfl: 2   PROAIR RESPICLICK 108 (90 Base) MCG/ACT AEPB, INHALE 1 PUFF BY MOUTH EVERY 6 HOURS AS NEEDED FOR COUGH, Disp: 1 each, Rfl: 0   rosuvastatin (CRESTOR) 20 MG tablet, Take 1 tablet (20 mg total) by mouth daily., Disp: 90 tablet, Rfl: 3   Tiotropium Bromide Monohydrate (SPIRIVA RESPIMAT) 2.5 MCG/ACT AERS, Inhale 2 puffs into the lungs daily., Disp: 60 each, Rfl: 12   Subjective:   PATIENT ID: Debra Hogan GENDER: female DOB: [birth year], MRN: [id number]  Chief Complaint  Patient presents with   Consult    Cough and wheezing. Shortness of breath on exertion and rest.     HPI  Patient is a pleasant 62 year old female presenting to clinic for the evaluation of shortness of breath  and cough.  Patient is presenting for the evaluation of chronic symptoms of shortness of breath and exertional dyspnea. Cough productive of scant sputum, but no hemoptysis. Denies chest tightness, but does report wheeze. Patient previously seen in our clinic in 2019 by Dr. Nicholos Johns for management of emphysema (FEV1 of 55% predicted at the time) for which she was started on ICS/LABA (currently on Wixela). Patient presents today on oxygen via tank. Her most recent COPD exacerbation was in March of 2024, requiring a course of antibiotics and steroids. Patient is enrolled in our lung cancer cancer screening program and her last chest CT was in April of 2024.  Patient also recently seen by cardiology for chest pain/discomfort. She has a history of CAD, HTN, and pericardial effusion. She is managed medically. RHC from 2020 showed RA 12, RV (42/14), PA (38/18 - mean 27), PCWP 14.  Patient previously worked in the [employer] in [employer]. She reports a long standing history of smoking, and is currently actively smoking (1 ppd). She has at least 50 pack years of smoking history.   Ancillary information including prior medications, full medical/surgical/family/social histories, and PFTs (when available) are listed below and have been reviewed.   Review of Systems  Constitutional:  Negative for chills, fever, malaise/fatigue and weight loss.  Respiratory:  Positive for cough, sputum production, shortness of breath and wheezing. Negative for hemoptysis.   Cardiovascular:  Negative for chest pain and palpitations.     Objective:   Vitals:   12/12/23 1145  BP: 116/84  Pulse: 94  Temp: 97.6 F (36.4 C)  TempSrc: Temporal  SpO2: 96%  Weight: 155 lb (70.3 kg)  Height: 5\' 5"  (1.651 m)   96% on 3 LPM BMI Readings from Last 3 Encounters:  12/12/23 25.79 kg/m  12/10/23 25.56 kg/m  05/18/23 25.88 kg/m   Wt Readings from Last 3 Encounters:  12/12/23 155 lb (70.3 kg)  12/10/23 153 lb 9.6  oz (69.7 kg)  05/18/23 155 lb 8 oz (70.5 kg)    Physical Exam Constitutional:      Appearance: Normal appearance.  Cardiovascular:     Rate and Rhythm: Normal rate and regular rhythm.  Pulmonary:  Effort: Pulmonary effort is normal.     Comments: Decreased breath sounds, no wheeze Neurological:     General: No focal deficit present.     Mental Status: She is alert and oriented to person, place, and time. Mental status is at baseline.       Ancillary Information    Past Medical History:  Diagnosis Date   Anginal pain (HCC)    Arthritis    COPD (chronic obstructive pulmonary disease) (HCC)    Depression    Dyspnea    Fibromyalgia    Headache    Heat stroke 01-19-2006   Irritable bowel syndrome    MS (multiple sclerosis) (HCC)    Followed by Dr. Sherryll Burger   Osteoarthritis of left shoulder 01/12/2022     Family History  Problem Relation Age of Onset   Hypertension Mother    Hyperlipidemia Mother    Arthritis Mother    Cancer Father        throat   Heart disease Brother    Bipolar disorder Son    Schizophrenia Son    Cancer Maternal Grandfather        liver   Cancer Paternal Grandmother        liver     Past Surgical History:  Procedure Laterality Date   ABDOMINAL HYSTERECTOMY     CERVIX intact; had for noncancerous reasons, endometriosis   CHOLECYSTECTOMY N/A 01/03/2016   Procedure: LAPAROSCOPIC CHOLECYSTECTOMY WITH INTRAOPERATIVE CHOLANGIOGRAM;  Surgeon: Lattie Haw, MD;  Location: [facility] ORS;  Service: General;  Laterality: N/A;   ENDOSCOPIC RETROGRADE CHOLANGIOPANCREATOGRAPHY (ERCP) WITH PROPOFOL N/A 01/04/2016   Procedure: ENDOSCOPIC RETROGRADE CHOLANGIOPANCREATOGRAPHY (ERCP) WITH PROPOFOL;  Surgeon: Midge Minium, MD;  Location: [facility] ENDOSCOPY;  Service: Endoscopy;  Laterality: N/A;   FOOT SURGERY  2001-01-19   LAPAROSCOPIC ENDOMETRIOSIS FULGURATION     RIGHT/LEFT HEART CATH AND CORONARY ANGIOGRAPHY Bilateral 09/29/2019   Procedure: RIGHT/LEFT HEART CATH AND CORONARY  ANGIOGRAPHY;  Surgeon: Yvonne Kendall, MD;  Location: [facility] INVASIVE CV LAB;  Service: Cardiovascular;  Laterality: Bilateral;    Social History   Socioeconomic History   Marital status: Married    Spouse name: Not on file   Number of children: Not on file   Years of education: Not on file   Highest education level: Not on file  Occupational History   Not on file  Tobacco Use   Smoking status: Every Day    Current packs/day: 1.00    Average packs/day: 1 pack/day for 52.0 years (52.0 ttl pk-yrs)    Types: Cigarettes    Start date: 34   Smokeless tobacco: Never  Vaping Use   Vaping status: Never Used  Substance and Sexual Activity   Alcohol use: No    Alcohol/week: 0.0 standard drinks of alcohol   Drug use: No   Sexual activity: Not Currently    Birth control/protection: Post-menopausal  Other Topics Concern   Not on file  Social History Narrative   Lives in [place]. Widowed x 2.      [name] Son died 01/19/23 at 50 years of age.       Married for 14  years.       On disability      Social Drivers of Corporate investment banker Strain: Low Risk  (05/29/2022)   Overall Financial Resource Strain (CARDIA)    Difficulty of Paying Living Expenses: Not hard at all  Food Insecurity: No Food Insecurity (05/29/2022)   Hunger Vital Sign    Worried  About Running Out of Food in the Last Year: Never true    Ran Out of Food in the Last Year: Never true  Transportation Needs: No Transportation Needs (05/29/2022)   PRAPARE - Administrator, Civil Service (Medical): No    Lack of Transportation (Non-Medical): No  Physical Activity: Insufficiently Active (05/29/2022)   Exercise Vital Sign    Days of Exercise per Week: 3 days    Minutes of Exercise per Session: 10 min  Stress: No Stress Concern Present (05/29/2022)   [employer] of Occupational Health - Occupational Stress Questionnaire    Feeling of Stress : Not at all  Social Connections: Unknown (05/29/2022)   Social  Connection and Isolation Panel [NHANES]    Frequency of Communication with Friends and Family: Not on file    Frequency of Social Gatherings with Friends and Family: Not on file    Attends Religious Services: Not on file    Active Member of Clubs or Organizations: Not on file    Attends Club or Organization Meetings: Not on file    Marital Status: Married  Intimate Partner Violence: Not At Risk (05/29/2022)   Humiliation, Afraid, Rape, and Kick questionnaire    Fear of Current or Ex-Partner: No    Emotionally Abused: No    Physically Abused: No    Sexually Abused: No     Allergies  Allergen Reactions   Valium [Diazepam] Anxiety     CBC    Component Value Date/Time   WBC 8.6 04/05/2022 1608   RBC 4.28 04/05/2022 1608   HGB 13.5 04/05/2022 1608   HGB 14.8 09/25/2019 1101   HCT 40.0 04/05/2022 1608   HCT 42.8 09/25/2019 1101   PLT 211.0 04/05/2022 1608   PLT 196 09/25/2019 1101   MCV 93.5 04/05/2022 1608   MCV 94 09/25/2019 1101   MCH 32.3 06/18/2020 1559   MCHC 33.7 04/05/2022 1608   RDW 12.9 04/05/2022 1608   RDW 12.2 09/25/2019 1101   LYMPHSABS 1.1 04/05/2022 1608   LYMPHSABS 0.5 (L) 09/25/2019 1101   MONOABS 0.6 04/05/2022 1608   EOSABS 0.1 04/05/2022 1608   EOSABS 0.1 09/25/2019 1101   BASOSABS 0.0 04/05/2022 1608   BASOSABS 0.0 09/25/2019 1101    Pulmonary Functions Testing Results:     No data to display          Outpatient Medications Prior to Visit  Medication Sig Dispense Refill   acyclovir (ZOVIRAX) 400 MG tablet Take 1 tablet (400 mg total) by mouth daily. 90 tablet 3   ALPRAZolam (XANAX) 0.25 MG tablet Take 1 tablet (0.25 mg total) by mouth daily as needed. for anxiety 30 tablet 0   aspirin EC 81 MG tablet Take 81 mg by mouth daily.     benzonatate (TESSALON) 100 MG capsule Take 1 capsule (100 mg total) by mouth 2 (two) times daily as needed for cough. 20 capsule 0   cholecalciferol (VITAMIN D) 1000 units tablet Take 1,000 Units by mouth daily.      cyclobenzaprine (FLEXERIL) 10 MG tablet Take 1 tablet (10 mg total) by mouth at bedtime. 30 tablet 5   diltiazem (CARDIZEM CD) 180 MG 24 hr capsule TAKE 1 CAPSULE BY MOUTH ONCE DAILY 90 capsule 3   escitalopram (LEXAPRO) 20 MG tablet Take 1 tablet (20 mg total) by mouth daily. 90 tablet 3   fluticasone-salmeterol (ADVAIR) 250-50 MCG/ACT AEPB INHALE 1 DOSE BY MOUTH TWICE DAILY AM  AND  BEDTIME 60 each 0  gabapentin (NEURONTIN) 100 MG capsule Take 1 capsule (100 mg total) by mouth 3 (three) times daily. 270 capsule 3   HYDROcodone-acetaminophen (NORCO/VICODIN) 5-325 MG tablet Take 1 tablet by mouth every 4 (four) hours as needed for moderate pain (pain score 4-6) or severe pain (pain score 7-10). 130 tablet 0   isosorbide mononitrate (IMDUR) 30 MG 24 hr tablet Take 1 tablet (30 mg total) by mouth daily. 90 tablet 3   lisinopril (ZESTRIL) 20 MG tablet Take 1 tablet (20 mg total) by mouth daily. 90 tablet 3   Multiple Vitamins-Minerals (CENTRUM SILVER ULTRA WOMENS) TABS Take 1 tablet by mouth daily.     nitroGLYCERIN (NITROSTAT) 0.4 MG SL tablet Place 1 tablet (0.4 mg total) under the tongue every 5 (five) minutes as needed for chest pain. 25 tablet 3   ondansetron (ZOFRAN-ODT) 4 MG disintegrating tablet Take 1 tablet (4 mg total) by mouth every 6 (six) hours as needed for nausea or vomiting. 20 tablet 2   PROAIR RESPICLICK 108 (90 Base) MCG/ACT AEPB INHALE 1 PUFF BY MOUTH EVERY 6 HOURS AS NEEDED FOR COUGH 1 each 0   rosuvastatin (CRESTOR) 20 MG tablet Take 1 tablet (20 mg total) by mouth daily. 90 tablet 3   hydrOXYzine (ATARAX) 10 MG tablet Take 1 tablet by mouth twice daily as needed for anxiety 60 tablet 1   No facility-administered medications prior to visit.

## 2023-12-12 NOTE — Patient Instructions (Signed)
The [facility] Quitline: Call 1-800-QUIT-NOW 575-707-4353). The [facility] Quitline is a free service for [employer]. Trained counselors are available from 8 am until 3 am, 365 days per year. Services are available in both [place] and [place].   Web Resources Free online support programs can help you track your progress and share experiences with others who are quitting. These are examples: www.becomeanex.org www.trytostop.org  www.smokefree.gov  www.[employer].com.br.aspx  [facility]: offers comprehensive in-person tobacco treatment counseling at [facility] ([address], [place] [address]).  Open to everyone. Virtual appointments available. Free parking. Call [phone] to schedule an appointment or [phone] for general information.    Tobacco Cessation Medications  Nicotine Replacement Therapy (NRT)  Nicotine is the addictive part of tobacco smoke, but not the most dangerous part. There are 7000 other toxins in cigarettes, including carbon monoxide, that cause disease. People do not generally become addicted to medication. Common problems: People don't use enough medication or stop too early. Medications are safe and effective. Overdose is very uncommon. Use medications as long as needed (3 months minimum). Some combinations work better than single medications. Long acting medications like the NRT patch and bupropion provide continuous treatment for withdrawal symptoms.  PLUS  Short acting medications like the NRT gum, lozenge, inhaler, and nasal spray help people to cope with breakthrough cravings.  ? Nicotine Patch  Place patch on hairless skin on upper body, including arms and back. Each day: discard old patch, shower, apply new patch to a different site. Apply hydrocortisone cream to mildly red/irritated areas. Call provider if rash develops. If patch causes sleep disturbance, remove patch  at bedtime and replace each morning after shower. Side effects may include: skin irritation, headache, insomnia, abnormal/vivid dreams.  ? Nicotine Gum  Chew gum slowly, park in cheek when peppery taste or tingling sensation begins (about 15-30 chews). When taste or tingling goes away, begin chewing again. Use until nicotine is gone (taste or tingle does not return, usually 30 minutes). Park in different areas of mouth. Nicotine is absorbed through the lining of the mouth. Use enough to control cravings, up to 24 pieces per day (if used alone). Avoid eating or drinking for 15 minutes before using and during use. Side effects may include: mouth/jaw soreness, hiccups, indigestion, hypersalivation.  If gum is not chewed correctly, additional side effects may include lightheadedness, nausea/vomiting, throat and mouth irritation.  ? Nicotine Lozenge  Allow to dissolve slowly in mouth (20-30 minutes). Do not chew or swallow. Nicotine release may cause a warm tingling sensation. Occasionally rotate to different areas of the mouth. Use enough to control cravings, up to 20 lozenges per day (if used alone). Avoid eating or drinking for 15 minutes before using and during use. Side effects may include: nausea, hiccups, cough, heartburn, headache, gas, insomnia.  ? Nicotine Nasal Spray Use 1 spray in each nostril (1 dose) and tilt head back for 1 minute. Do not sniff, swallow, or inhale through nose.  Use at least 8 doses (1 spray in each nostril) , up to 40 doses per day (if used alone). To reduce nasal irritation, spray on cotton swab and insert into nose. Side effects may include: nasal and/or throat irritation (hot, peppery, or burning sensation), nasal irritation, tearing, sneezing, cough, headache.  ? Nicotine Oral Inhaler (puffer) Inhale into the back of the throat or puff in short breaths. Do not inhale into the lungs.  Puff continuously for 20 minutes (about 80 puffs) until cartridge  is  empty. Change cartridge when it loses the "burning in throat" sensation (feels like air only). Open cartridges can be saved and used again within 24 hours. Use at least 6 and up to 16 cartridges per day (if used alone).  Avoid eating or drinking for 15 minutes before using and during use. Side effects may include: mouth and/or throat irritation, unpleasant taste, cough, nasal irritation, indigestion, hiccups, headache.  ? Chantix (varenicline) Days 1-3: Take one 0.5 mg white pill each morning for 3 days, one week before quit date. Days 4-7: Increase to one 0.5 mg white pill twice a day in morning and evening for 4 days.  On Day 8 (target quit date), increase to one 1 mg blue pill twice a day. Maintain this dose for a minimum of 3 months. Take with food and a full glass of water to reduce nausea. Be sure that the two doses are at least 8 hours apart, but try to take second dose early in the evening (i.e. 6 pm) to avoid sleep problems. Common side effects include: nausea, insomnia, headache, abnormal/vivid dreams. Tell your doctor if you have any history of psychiatric illness prior to starting Chantix.  STOP taking CHANTIX and contact a healthcare provider immediately if you experience agitation, hostility, depressed mood, changes in thoughts or behavior that are not typical for you, thinking about or attempting suicide, allergic or skin reactions including swelling, rash, redness, or peeling of the skin.  For patients who have heart disease: Smoking is a major risk factor for cardiovascular disease, and Chantix can help you quit smoking. Chantix may be associated with a small, increased risk of certain heart events in patients who have heart disease. If you have any new or worsening symptoms of heart disease while taking Chantix, such as shortness of breath or trouble breathing, new or worsening chest pain, or new or worsening pain in your legs when walking, call your doctor or get emergency medical  help immediately.  ? Wellbutrin / Zyban (bupropion) Take one 150 mg pill each morning for 3 days, one week before target quit date. On Day 4, increase to one 150 mg pill twice a day, morning and evening.  Maintain this dose for a minimum of 3 months. Be sure that the two doses are at least 8 hours apart, but try to take second dose early in the evening (i.e. 6 pm) to avoid sleep problems. Avoid or minimize use of alcohol when taking this medication. Common side effects include: dry mouth, headache, insomnia, nausea, weight loss.  Risk of seizure is 12/998. STOP taking BUPROPION and contact a healthcare provider immediately if you experience agitation, hostility, depressed mood, changes in thoughts or behavior that are not typical for you, thinking about or attempting suicide, allergic or skin reactions including swelling, rash, redness, or peeling of the skin.

## 2024-01-02 ENCOUNTER — Ambulatory Visit: Payer: PPO

## 2024-01-02 ENCOUNTER — Other Ambulatory Visit: Payer: Self-pay

## 2024-01-02 DIAGNOSIS — J9611 Chronic respiratory failure with hypoxia: Secondary | ICD-10-CM

## 2024-01-03 ENCOUNTER — Ambulatory Visit (INDEPENDENT_AMBULATORY_CARE_PROVIDER_SITE_OTHER): Payer: Medicare Other

## 2024-01-03 ENCOUNTER — Ambulatory Visit (INDEPENDENT_AMBULATORY_CARE_PROVIDER_SITE_OTHER): Payer: Medicare Other | Admitting: Family

## 2024-01-03 ENCOUNTER — Encounter: Payer: Self-pay | Admitting: Family

## 2024-01-03 VITALS — BP 122/78 | HR 78 | Temp 98.5°F | Ht 64.0 in | Wt 151.6 lb

## 2024-01-03 DIAGNOSIS — G8929 Other chronic pain: Secondary | ICD-10-CM

## 2024-01-03 DIAGNOSIS — Z0000 Encounter for general adult medical examination without abnormal findings: Secondary | ICD-10-CM | POA: Diagnosis not present

## 2024-01-03 DIAGNOSIS — F411 Generalized anxiety disorder: Secondary | ICD-10-CM

## 2024-01-03 DIAGNOSIS — F419 Anxiety disorder, unspecified: Secondary | ICD-10-CM | POA: Diagnosis not present

## 2024-01-03 DIAGNOSIS — M47816 Spondylosis without myelopathy or radiculopathy, lumbar region: Secondary | ICD-10-CM | POA: Diagnosis not present

## 2024-01-03 DIAGNOSIS — M546 Pain in thoracic spine: Secondary | ICD-10-CM | POA: Diagnosis not present

## 2024-01-03 DIAGNOSIS — M419 Scoliosis, unspecified: Secondary | ICD-10-CM | POA: Diagnosis not present

## 2024-01-03 DIAGNOSIS — M47814 Spondylosis without myelopathy or radiculopathy, thoracic region: Secondary | ICD-10-CM | POA: Diagnosis not present

## 2024-01-03 DIAGNOSIS — R319 Hematuria, unspecified: Secondary | ICD-10-CM | POA: Diagnosis not present

## 2024-01-03 DIAGNOSIS — I10 Essential (primary) hypertension: Secondary | ICD-10-CM | POA: Diagnosis not present

## 2024-01-03 DIAGNOSIS — F32A Depression, unspecified: Secondary | ICD-10-CM | POA: Diagnosis not present

## 2024-01-03 DIAGNOSIS — I700 Atherosclerosis of aorta: Secondary | ICD-10-CM | POA: Diagnosis not present

## 2024-01-03 DIAGNOSIS — M545 Low back pain, unspecified: Secondary | ICD-10-CM | POA: Diagnosis not present

## 2024-01-03 DIAGNOSIS — M5441 Lumbago with sciatica, right side: Secondary | ICD-10-CM | POA: Diagnosis not present

## 2024-01-03 DIAGNOSIS — M5442 Lumbago with sciatica, left side: Secondary | ICD-10-CM

## 2024-01-03 MED ORDER — ALPRAZOLAM 0.25 MG PO TABS: 0.2500 mg | ORAL_TABLET | Freq: Every day | ORAL | 1 refills | Status: DC | PRN

## 2024-01-03 NOTE — Patient Instructions (Signed)
 I suspect musculoskeletal etiology.  We will update your  x-rays.  Please use Salonpas pain patch, heating pad and cyclobenzaprine  (Flexeril ) as needed.   Do not drive or operate heavy machinery while on muscle relaxant. Please do not drink alcohol. Only take this medication as needed for acute muscle spasm at bedtime. This medication make you feel drowsy so be very careful.  Stop taking if become too drowsy or somnolent as this puts you at risk for falls. Please contact our office with any questions.

## 2024-01-03 NOTE — Progress Notes (Signed)
 Assessment & Plan:  Routine physical examination Assessment & Plan: Patient declines clinical breast exam in the office.  She declines further screening for breast cancer, colon cancer.  She is participating in the CT lung cancer screening.  Exercise severely limited in the setting  of chronic respiratory failure, oxygen  dependent.  Encouraged pulmonary rehab as referred by pulmonary   Atherosclerosis of aorta (HCC) -     Comprehensive metabolic panel -     Lipid panel  Primary hypertension -     TSH -     CBC with Differential/Platelet  Chronic right-sided thoracic back pain -     DG Thoracic Spine W/Swimmers; Future -     DG Lumbar Spine Complete; Future -     Urinalysis, Routine w reflex microscopic -     Urine Culture  Anxiety and depression -     ALPRAZolam ; Take 1 tablet (0.25 mg total) by mouth daily as needed. for anxiety  Dispense: 30 tablet; Refill: 1  Generalized anxiety disorder Assessment & Plan: Chronic, stable. Continue Lexapro  20 mg, Xanax  0.25 mg every day.    Chronic bilateral low back pain with bilateral sciatica Assessment & Plan: Acute on chronic thoracic and low back pain.  Known history of lumbar spinal stenosis.  She continues to follow with Dr. Myra, pain management who prescribes hydrocodone .  She is compliant gabapentin  100 mg 3 times daily.  Exam supports muscular strain.  Encouraged use of cyclobenzaprine , heat.  Counseled on sedation in the setting of muscle relaxant.  Encouraged use of lidocaine  patch . updating thoracic and lumbar x-rays.  Consider transitioning from Lexapro  to Cymbalta .      Return precautions given.   Risks, benefits, and alternatives of the medications and treatment plan prescribed today were discussed, and patient expressed understanding.   Education regarding symptom management and diagnosis given to patient on AVS either electronically or printed.  Return in about 3 months (around 04/02/2024), or if symptoms worsen or  fail to improve.  Rollene Northern, FNP  Subjective:    Patient ID: Debra Hogan, female    DOB: 1961/08/02, 63 y.o.   MRN: [id number]  CC: Debra Hogan is a 64 y.o. female who presents today for physical exam.    HPI: Accompanied by husband  Complains of right mid back and ruq pain x one week   After massage, right back pain resolved however pain along RUQ is still present. Describes as ' a cramp'  Pain feels superficial  Pain is not worse after eating.   Bending makes pain worse  No dysuria, fever, vomiting, constipation, C., rash   SOB worsening over the past year.   Interrmittent cough and sputum productive which is overall baseline for her.      She take hydrocodone  for back pain as prescribed by Dr Myra, some relief hydrocodone .   Compliant with gabapentin  100mg  tid without relief  H/o renal stone  H/o cholecystectomy  MRI lumbar spine 02/2020 mild central canal and left recess narrowing L3-L4.  Small focal protrusion in the left recess at L5 to S1.   She is taking xanax  0.25mg  every day; requests refill    Follow-up pulmonology 12/12/2023 for tele pulmonary rehab Follow-up cardiology for precordial pain 12/10/2023; started on Imdur  30 mg daily.  Continued on Cardizem  180 mg daily, lisinopril  20mg  daily, rosuvastatin  20 mg daily, aspirin  81 mg; follow-up scheduled with cardiology 01/17/2023  Colorectal Cancer Screening: due; reported as done in 2012.  No record in epic.  She declines.   Breast Cancer Screening: overdue; she declines  Cervical Cancer Screening: UTD; negative malignancy, NILM  Bone Health screening/DEXA for 65+: No increased fracture risk. Defer screening at this time.  Lung Cancer Screening: Participating in CT lung cancer screening, last done 04/14/2023.  Lung RADS 2          Tetanus - UTD        Pneumococcal - complete Exercise: No regular exercise, severely limited due to respiratory failure Alcohol use:  none Smoking/tobacco use:  Nonsmoker.    Health Maintenance  Topic Date Due   Zoster (Shingles) Vaccine (1 of 2) Never done   Pneumococcal Vaccination (2 of 2 - PCV) 05/14/2019   Medicare Annual Wellness Visit  05/30/2023   Flu Shot  07/26/2023   Colon Cancer Screening  01/13/2024*   Screening for Lung Cancer  04/08/2024   Pap with HPV screening  04/06/2027   DTaP/Tdap/Td vaccine (3 - Td or Tdap) 05/13/2028   Hepatitis C Screening  Completed   HIV Screening  Completed   HPV Vaccine  Aged Out   Mammogram  Discontinued   COVID-19 Vaccine  Discontinued  *Topic was postponed. The date shown is not the original due date.    ALLERGIES: Valium [diazepam]  Current Outpatient Medications on File Prior to Visit  Medication Sig Dispense Refill   acyclovir  (ZOVIRAX ) 400 MG tablet Take 1 tablet (400 mg total) by mouth daily. 90 tablet 3   aspirin  EC 81 MG tablet Take 81 mg by mouth daily.     benzonatate  (TESSALON ) 100 MG capsule Take 1 capsule (100 mg total) by mouth 2 (two) times daily as needed for cough. 20 capsule 0   cholecalciferol (VITAMIN D ) 1000 units tablet Take 1,000 Units by mouth daily.     cyclobenzaprine  (FLEXERIL ) 10 MG tablet Take 1 tablet (10 mg total) by mouth at bedtime. 30 tablet 5   diltiazem  (CARDIZEM  CD) 180 MG 24 hr capsule TAKE 1 CAPSULE BY MOUTH ONCE DAILY 90 capsule 3   escitalopram  (LEXAPRO ) 20 MG tablet Take 1 tablet (20 mg total) by mouth daily. 90 tablet 3   fluticasone -salmeterol (ADVAIR) 250-50 MCG/ACT AEPB INHALE 1 DOSE BY MOUTH TWICE DAILY AM  AND  BEDTIME 60 each 0   gabapentin  (NEURONTIN ) 100 MG capsule Take 1 capsule (100 mg total) by mouth 3 (three) times daily. 270 capsule 3   HYDROcodone -acetaminophen  (NORCO/VICODIN) 5-325 MG tablet Take 1 tablet by mouth every 4 (four) hours as needed for moderate pain (pain score 4-6) or severe pain (pain score 7-10). 130 tablet 0   isosorbide  mononitrate (IMDUR ) 30 MG 24 hr tablet Take 1 tablet (30 mg total) by mouth daily. 90 tablet 3    lisinopril  (ZESTRIL ) 20 MG tablet Take 1 tablet (20 mg total) by mouth daily. 90 tablet 3   Multiple Vitamins-Minerals (CENTRUM SILVER ULTRA WOMENS) TABS Take 1 tablet by mouth daily.     nicotine  (NICODERM CQ  - DOSED IN MG/24 HOURS) 21 mg/24hr patch Place 1 patch (21 mg total) onto the skin daily. 42 patch 0   nicotine  polacrilex (NICOTINE  MINI) 2 MG lozenge Take 1 lozenge (2 mg total) by mouth every 2 (two) hours as needed for smoking cessation. 72 lozenge 3   nitroGLYCERIN  (NITROSTAT ) 0.4 MG SL tablet Place 1 tablet (0.4 mg total) under the tongue every 5 (five) minutes as needed for chest pain. 25 tablet 3   ondansetron  (ZOFRAN -ODT) 4 MG disintegrating tablet Take 1 tablet (4 mg total) by  mouth every 6 (six) hours as needed for nausea or vomiting. 20 tablet 2   PROAIR  RESPICLICK 108 (90 Base) MCG/ACT AEPB INHALE 1 PUFF BY MOUTH EVERY 6 HOURS AS NEEDED FOR COUGH 1 each 0   rosuvastatin  (CRESTOR ) 20 MG tablet Take 1 tablet (20 mg total) by mouth daily. 90 tablet 3   Tiotropium Bromide  Monohydrate (SPIRIVA  RESPIMAT) 2.5 MCG/ACT AERS Inhale 2 puffs into the lungs daily. 60 each 12   No current facility-administered medications on file prior to visit.    Review of Systems  Constitutional:  Negative for chills and fever.  Respiratory:  Negative for cough.   Cardiovascular:  Negative for chest pain and palpitations.  Gastrointestinal:  Positive for abdominal pain. Negative for nausea and vomiting.  Musculoskeletal:  Positive for back pain.      Objective:    BP 122/78   Pulse 78   Temp 98.5 F (36.9 C) (Oral)   Ht 5' 4 (1.626 m)   Wt 151 lb 9.6 oz (68.8 kg)   SpO2 98%   BMI 26.02 kg/m   BP Readings from Last 3 Encounters:  01/03/24 122/78  12/12/23 116/84  12/10/23 124/68   Wt Readings from Last 3 Encounters:  01/03/24 151 lb 9.6 oz (68.8 kg)  12/12/23 155 lb (70.3 kg)  12/10/23 153 lb 9.6 oz (69.7 kg)    Physical Exam Vitals reviewed.  Constitutional:      Appearance:  Normal appearance. She is well-developed.  Eyes:     Conjunctiva/sclera: Conjunctivae normal.  Neck:     Thyroid : No thyroid  mass or thyromegaly.  Cardiovascular:     Rate and Rhythm: Normal rate and regular rhythm.     Pulses: Normal pulses.     Heart sounds: Normal heart sounds.  Pulmonary:     Effort: Pulmonary effort is normal.     Breath sounds: Normal breath sounds. No wheezing, rhonchi or rales.  Abdominal:     General: Bowel sounds are normal. There is no distension.     Palpations: Abdomen is soft. Abdomen is not rigid. There is no fluid wave or mass.     Tenderness: There is no abdominal tenderness. There is no right CVA tenderness, left CVA tenderness, guarding or rebound.       Comments: Not exquisite tenderness right upper quadrant as marked on diagram.  Abdomen is soft  Musculoskeletal:       Arms:     Thoracic back: Tenderness present. No spasms or bony tenderness. Normal range of motion.       Back:     Comments: No rash, edema  Lymphadenopathy:     Head:     Right side of head: No submental, submandibular, tonsillar, preauricular, posterior auricular or occipital adenopathy.     Left side of head: No submental, submandibular, tonsillar, preauricular, posterior auricular or occipital adenopathy.     Cervical: No cervical adenopathy.  Skin:    General: Skin is warm and dry.  Neurological:     Mental Status: She is alert.  Psychiatric:        Speech: Speech normal.        Behavior: Behavior normal.        Thought Content: Thought content normal.

## 2024-01-04 LAB — COMPREHENSIVE METABOLIC PANEL
ALT: 13 U/L (ref 0–35)
AST: 24 U/L (ref 0–37)
Albumin: 4.7 g/dL (ref 3.5–5.2)
Alkaline Phosphatase: 80 U/L (ref 39–117)
BUN: 21 mg/dL (ref 6–23)
CO2: 39 meq/L — ABNORMAL HIGH (ref 19–32)
Calcium: 9.9 mg/dL (ref 8.4–10.5)
Chloride: 94 meq/L — ABNORMAL LOW (ref 96–112)
Creatinine, Ser: 0.81 mg/dL (ref 0.40–1.20)
GFR: 76.76 mL/min (ref >60.00)
Glucose, Bld: 90 mg/dL (ref 70–99)
Potassium: 3.6 meq/L (ref 3.5–5.1)
Sodium: 142 meq/L (ref 135–145)
Total Bilirubin: 0.4 mg/dL (ref 0.2–1.2)
Total Protein: 8 g/dL (ref 6.0–8.3)

## 2024-01-04 LAB — LIPID PANEL
Cholesterol: 257 mg/dL — ABNORMAL HIGH (ref 0–200)
HDL: 57.3 mg/dL (ref >39.00)
LDL Cholesterol: 149 mg/dL — ABNORMAL HIGH (ref 0–99)
NonHDL: 199.42
Total CHOL/HDL Ratio: 4
Triglycerides: 252 mg/dL — ABNORMAL HIGH (ref 0.0–149.0)
VLDL: 50.4 mg/dL — ABNORMAL HIGH (ref 0.0–40.0)

## 2024-01-04 LAB — URINE CULTURE
MICRO NUMBER:: 15938196
Result:: NO GROWTH
SPECIMEN QUALITY:: ADEQUATE

## 2024-01-04 LAB — CBC WITH DIFFERENTIAL/PLATELET
Basophils Absolute: 0 10*3/uL (ref 0.0–0.1)
Basophils Relative: 0.7 % (ref 0.0–3.0)
Eosinophils Absolute: 0.1 10*3/uL (ref 0.0–0.7)
Eosinophils Relative: 0.9 % (ref 0.0–5.0)
HCT: 41.3 % (ref 36.0–46.0)
Hemoglobin: 14 g/dL (ref 12.0–15.0)
Lymphocytes Relative: 15.1 % (ref 12.0–46.0)
Lymphs Abs: 1.1 10*3/uL (ref 0.7–4.0)
MCHC: 33.9 g/dL (ref 30.0–36.0)
MCV: 95.5 fL (ref 78.0–100.0)
Monocytes Absolute: 0.6 10*3/uL (ref 0.1–1.0)
Monocytes Relative: 8.8 % (ref 3.0–12.0)
Neutro Abs: 5.2 10*3/uL (ref 1.4–7.7)
Neutrophils Relative %: 74.5 % (ref 43.0–77.0)
Platelets: 237 10*3/uL (ref 150.0–400.0)
RBC: 4.32 Mil/uL (ref 3.87–5.11)
RDW: 12.6 % (ref 11.5–15.5)
WBC: 7 10*3/uL (ref 4.0–10.5)

## 2024-01-04 LAB — URINALYSIS, ROUTINE W REFLEX MICROSCOPIC
Ketones, ur: NEGATIVE
Leukocytes,Ua: NEGATIVE
Nitrite: NEGATIVE
Specific Gravity, Urine: 1.02 (ref 1.000–1.030)
Total Protein, Urine: >=300 — AB
Urine Glucose: NEGATIVE
Urobilinogen, UA: 1 (ref 0.0–1.0)
pH: 7.5 (ref 5.0–8.0)

## 2024-01-04 LAB — TSH: TSH: 3.28 u[IU]/mL (ref 0.35–5.50)

## 2024-01-04 NOTE — Assessment & Plan Note (Addendum)
 Acute on chronic thoracic and low back pain.  Known history of lumbar spinal stenosis.  She continues to follow with Dr. Myra, pain management who prescribes hydrocodone .  She is compliant gabapentin  100 mg 3 times daily.  Exam supports muscular strain.  Encouraged use of cyclobenzaprine , heat.  Counseled on sedation in the setting of muscle relaxant.  Encouraged use of lidocaine  patch . updating thoracic and lumbar x-rays.  Consider transitioning from Lexapro  to Cymbalta .

## 2024-01-04 NOTE — Assessment & Plan Note (Signed)
 Patient declines clinical breast exam in the office.  She declines further screening for breast cancer, colon cancer.  She is participating in the CT lung cancer screening.  Exercise severely limited in the setting  of chronic respiratory failure, oxygen  dependent.  Encouraged pulmonary rehab as referred by pulmonary

## 2024-01-04 NOTE — Assessment & Plan Note (Signed)
 Chronic, stable. Continue Lexapro 20 mg, Xanax 0.25 mg every day.

## 2024-01-09 ENCOUNTER — Encounter: Payer: Self-pay | Admitting: Family

## 2024-01-11 ENCOUNTER — Encounter: Payer: Self-pay | Admitting: Family

## 2024-01-15 ENCOUNTER — Other Ambulatory Visit: Payer: Self-pay | Admitting: Family

## 2024-01-15 DIAGNOSIS — R319 Hematuria, unspecified: Secondary | ICD-10-CM

## 2024-01-16 NOTE — Progress Notes (Deleted)
 Cardiology Office Note    Date:  01/16/2024   ID:  Debra Hogan, DOB 1961/11/01, MRN [id number]  PCP:  Allegra Grana, FNP  Cardiologist:  Lorine Bears, MD  Electrophysiologist:  None   Chief Complaint: Follow-up  History of Present Illness:   Debra Hogan is a 63 y.o. female with history of chest pain, pulmonary hypertension, pericardial effusion, fibromyalgia, multiple sclerosis, chronic hypoxic respiratory failure on supplemental oxygen, COPD with ongoing tobacco use, aortic atherosclerosis, HLD, and degenerative disc disease with chronic back pain who presents for follow-up of ***.  CT of the lungs in 04/2018 showed a small pericardial.  She was later evaluated in 2019 for chest pain with Lexiscan MPI showing no evidence of ischemia.  Due to ongoing symptoms she underwent LHC in 09/2019 that showed mild proximal RCA stenosis with catheter induced spasm that improved with nitroglycerin.  There was no other obstructive disease.  RHC showed mild pulmonary hypertension with moderately elevated RA pressure.  She was subsequently placed on diltiazem for blood pressure and presumed to spasm with some improvement in symptoms.  She was lost to follow-up from 2021 until reestablishing in 04/2023.  At that time, she reported worsening chest pain over the past year that would last anywhere from a few seconds to up to 10 minutes.  Pain would happen at rest, that was more likely to happen with exertion and not responsive to sublingual nitroglycerin.  She continued to smoke 1 pack/day and was using 2 L of supplemental oxygen.  She also noted worsening bilateral lower extremity swelling, particularly on the right side.  Subsequent Lexiscan MPI in 05/2023 showed no significant ischemia or scar with an EF greater than 65%.  No significant coronary artery calcification.  Aortic atherosclerosis noted.  Of small pericardial effusion.  Overall, this was a low risk scan.  Subsequent echo in 10/2023 showed an EF of  65 to 70%, no regional wall motion abnormalities, grade 1 diastolic dysfunction, normal RV systolic function and ventricular cavity size, small pericardial effusion that was circumferential and without evidence of cardiac tamponade, and trivial mitral regurgitation.  She was most recently seen in the office in 11/2023 noting a several year history of ongoing chest pain that would typically occur 3-4 times per week factors.  Pain would typically last for 20 minutes without radiation and was responsive to SL NTG.  She reported significant shortness of breath when she would take her oxygen off and began to walk.  She continued to smoke 1 pack/day.  She was started on Imdur 30 mg daily.  Follow-up limited echo, to trend pericardial effusion, was ordered and remains pending.  ***   Labs independently reviewed: 12/2023 - TC 257, TG 252, HDL 57, LDL 149, potassium 3.6, BUN 21, serum creatinine 0.81, albumin 4.7, AST/ALT normal, Hgb 14.0, PLT 237, TSH normal  Past Medical History:  Diagnosis Date   Anginal pain (HCC)    Arthritis    COPD (chronic obstructive pulmonary disease) (HCC)    Depression    Dyspnea    Fibromyalgia    Headache    Heat stroke 2006   Irritable bowel syndrome    MS (multiple sclerosis) (HCC)    Followed by Dr. Sherryll Burger   Osteoarthritis of left shoulder 01/12/2022    Past Surgical History:  Procedure Laterality Date   ABDOMINAL HYSTERECTOMY     CERVIX intact; had for noncancerous reasons, endometriosis   CHOLECYSTECTOMY N/A 01/03/2016   Procedure: LAPAROSCOPIC CHOLECYSTECTOMY WITH INTRAOPERATIVE CHOLANGIOGRAM;  Surgeon: Lattie Haw, MD;  Location: [facility] ORS;  Service: General;  Laterality: N/A;   ENDOSCOPIC RETROGRADE CHOLANGIOPANCREATOGRAPHY (ERCP) WITH PROPOFOL N/A 01/04/2016   Procedure: ENDOSCOPIC RETROGRADE CHOLANGIOPANCREATOGRAPHY (ERCP) WITH PROPOFOL;  Surgeon: Midge Minium, MD;  Location: [facility] ENDOSCOPY;  Service: Endoscopy;  Laterality: N/A;   FOOT SURGERY  2001    LAPAROSCOPIC ENDOMETRIOSIS FULGURATION     RIGHT/LEFT HEART CATH AND CORONARY ANGIOGRAPHY Bilateral 09/29/2019   Procedure: RIGHT/LEFT HEART CATH AND CORONARY ANGIOGRAPHY;  Surgeon: Yvonne Kendall, MD;  Location: [facility];  Service: Cardiovascular;  Laterality: Bilateral;    Current Medications: No outpatient medications have been marked as taking for the 01/18/24 encounter (Appointment) with Sondra Barges, PA-C.    Allergies:   Valium [diazepam]   Social History   Socioeconomic History   Marital status: Married    Spouse name: Not on file   Number of children: Not on file   Years of education: Not on file   Highest education level: Not on file  Occupational History   Not on file  Tobacco Use   Smoking status: Every Day    Current packs/day: 1.00    Average packs/day: 1 pack/day for 52.1 years (52.1 ttl pk-yrs)    Types: Cigarettes    Start date: 61   Smokeless tobacco: Never  Vaping Use   Vaping status: Never Used  Substance and Sexual Activity   Alcohol use: No    Alcohol/week: 0.0 standard drinks of alcohol   Drug use: No   Sexual activity: Not Currently    Birth control/protection: Post-menopausal  Other Topics Concern   Not on file  Social History Narrative   Lives in [place]. Widowed x 2.      Debra Hogan Son died November 30, 2022 at 80 years of age.       Married for 14  years.       On disability      Social Drivers of Corporate investment banker Strain: Low Risk  (05/29/2022)   Overall Financial Resource Strain (CARDIA)    Difficulty of Paying Living Expenses: Not hard at all  Food Insecurity: No Food Insecurity (05/29/2022)   Hunger Vital Sign    Worried About Running Out of Food in the Last Year: Never true    Ran Out of Food in the Last Year: Never true  Transportation Needs: No Transportation Needs (05/29/2022)   PRAPARE - Administrator, Civil Service (Medical): No    Lack of Transportation (Non-Medical): No  Physical Activity:  Insufficiently Active (05/29/2022)   Exercise Vital Sign    Days of Exercise per Week: 3 days    Minutes of Exercise per Session: 10 min  Stress: No Stress Concern Present (05/29/2022)   [employer] of Occupational Health - Occupational Stress Questionnaire    Feeling of Stress : Not at all  Social Connections: Unknown (05/29/2022)   Social Connection and Isolation Panel [NHANES]    Frequency of Communication with Friends and Family: Not on file    Frequency of Social Gatherings with Friends and Family: Not on file    Attends Religious Services: Not on file    Active Member of Clubs or Organizations: Not on file    Attends Banker Meetings: Not on file    Marital Status: Married     Family History:  The patient's family history includes Arthritis in her mother; Bipolar disorder in her son; Cancer in her father, maternal grandfather, and paternal grandmother; Heart  disease in her brother; Hyperlipidemia in her mother; Hypertension in her mother; Schizophrenia in her son.  ROS:   12-point review of systems is negative unless otherwise noted in the HPI.   EKGs/Labs/Other Studies Reviewed:    Studies reviewed were summarized above. The additional studies were reviewed today:  2D echo 11/16/2023: 1. Left ventricular ejection fraction, by estimation, is 65 to 70%. The  left ventricle has normal function. The left ventricle has no regional  wall motion abnormalities. Left ventricular diastolic parameters are  consistent with Grade I diastolic  dysfunction (impaired relaxation).   2. Right ventricular systolic function is normal. The right ventricular  size is normal.   3. A small pericardial effusion is present. The pericardial effusion is  circumferential. There is no evidence of cardiac tamponade.   4. The mitral valve is normal in structure. Trivial mitral valve  regurgitation.   5. The aortic valve is tricuspid. Aortic valve regurgitation is not  visualized.   6.  The inferior vena cava is normal in size with <50% respiratory  variability, suggesting right atrial pressure of 8 mmHg.  __________  Eugenie Birks MPI 05/29/2023:   Normal pharmacologic myocardial perfusion stress test without significant ischemia or scar.   Left ventricular systolic function is normal (LVEF > 65%).   There is no significant coronary artery calcification noted on the attenuation correction CT.  Aortic atherosclerosis is noted.   Incidental note is made of a small pericardial effusion.   This is a low risk study. __________  2D echo 05/13/2020: 1. Left ventricular ejection fraction, by estimation, is 60 to 65%. The  left ventricle has normal function. The left ventricle has no regional  wall motion abnormalities. Left ventricular diastolic parameters are  consistent with Grade I diastolic  dysfunction (impaired relaxation).   2. Right ventricular systolic function is normal. The right ventricular  size is normal. There is mildly elevated pulmonary artery systolic  pressure.   3. The mitral valve is normal in structure. No evidence of mitral valve  regurgitation. No evidence of mitral stenosis.   4. The aortic valve is normal in structure. Aortic valve regurgitation is  not visualized. No aortic stenosis is present.   5. The inferior vena cava is normal in size with greater than 50%  respiratory variability, suggesting right atrial pressure of 3 mmHg.   Comparison(s): EF 60-65%.  ___________  [facility] 09/29/2019: Conclusions: No significant atherosclerotic CAD.  Vasospasm noted at the ostial RCA, which improved with IC NTG. Hyperdynamic LVEF with upper normal left heart filling pressures. Mild pulmonary hypertension. Moderately elevated right heart filling pressures. Normal Fick cardiac output/index.   Recommendations: Medical therapy; will start low-dose diltiazem and prn sublingual NTG. Primary prevention of CAD. Continued treatment of underlying pulmonary  disease. __________  2D echo 12/23/2018: - Left ventricle: The cavity size was normal. Systolic function was    normal. The estimated ejection fraction was in the range of 60%    to 65%. Wall motion was normal; there were no regional wall    motion abnormalities. Doppler parameters are consistent with    abnormal left ventricular relaxation (grade 1 diastolic    dysfunction).  - Left atrium: The atrium was normal in size.  - Right ventricle: Systolic function was normal.  - Pulmonary arteries: Systolic pressure was moderately elevated. PA    peak pressure: 49 mm Hg (S).  __________  Lexiscan MPI 12/03/2018: Low risk, probably normal pharmacologic myocardial perfusion stress test. There is small in size, mild  in severity, fixed apical anterior and apical defect that most likely represent breast attenuation and less likely scar. There is no significant ischemia. The left ventricular ejection fraction is hyperdynamic (>65%) with normal regional wall motion.   EKG:  EKG is ordered today.  The EKG ordered today demonstrates ***  Recent Labs: 01/03/2024: ALT 13; BUN 21; Creatinine, Ser 0.81; Hemoglobin 14.0; Platelets 237.0; Potassium 3.6; Sodium 142; TSH 3.28  Recent Lipid Panel    Component Value Date/Time   CHOL 257 (H) 01/03/2024 1627   TRIG 252.0 (H) 01/03/2024 1627   HDL 57.30 01/03/2024 1627   CHOLHDL 4 01/03/2024 1627   VLDL 50.4 (H) 01/03/2024 1627   LDLCALC 149 (H) 01/03/2024 1627   LDLCALC 91 06/18/2020 1559   LDLDIRECT 86.0 04/05/2022 1608    PHYSICAL EXAM:    VS:  There were no vitals taken for this visit.  BMI: There is no height or weight on file to calculate BMI.  Physical Exam Constitutional:      Appearance: She is well-developed.  HENT:     Head: Normocephalic and atraumatic.  Eyes:     General:        Right eye: No discharge.        Left eye: No discharge.  Neck:     Vascular: No JVD.  Cardiovascular:     Rate and Rhythm: Normal rate and regular  rhythm.     Heart sounds: Normal heart sounds, S1 normal and S2 normal. Heart sounds not distant. No midsystolic click and no opening snap. No murmur heard.    No friction rub.  Pulmonary:     Effort: Pulmonary effort is normal. No respiratory distress.     Breath sounds: Normal breath sounds. No decreased breath sounds, wheezing or rales.  Chest:     Chest wall: No tenderness.  Abdominal:     General: There is no distension.     Palpations: Abdomen is soft.     Tenderness: There is no abdominal tenderness.  Musculoskeletal:     Cervical back: Normal range of motion.  Skin:    General: Skin is warm and dry.     Nails: There is no clubbing.  Neurological:     Mental Status: She is alert and oriented to person, place, and time.  Psychiatric:        Speech: Speech normal.        Behavior: Behavior normal.        Thought Content: Thought content normal.        Judgment: Judgment normal.     Wt Readings from Last 3 Encounters:  01/03/24 151 lb 9.6 oz (68.8 kg)  12/12/23 155 lb (70.3 kg)  12/10/23 153 lb 9.6 oz (69.7 kg)     ASSESSMENT & PLAN:   Chest pain:  Pericardial effusion:  Pulmonary hypertension:  HTN: Blood pressure  HLD: LDL 149 in 12/2023.  Chronic hypoxic respiratory failure on supplemental oxygen with COPD and ongoing tobacco use:   {Are you ordering a CV Procedure (e.g. stress test, cath, DCCV, TEE, etc)?   Press F2        :[id number]}     Disposition: F/u with Dr. Kirke Corin or an APP in ***.   Medication Adjustments/Labs and Tests Ordered: Current medicines are reviewed at length with the patient today.  Concerns regarding medicines are outlined above. Medication changes, Labs and Tests ordered today are summarized above and listed in the Patient Instructions accessible in Encounters.   Signed, Eula Listen,  PA-C 01/16/2024 10:24 AM     [facility] [place] [address] [place], [place] [address] [phone]

## 2024-01-18 ENCOUNTER — Ambulatory Visit: Payer: PPO | Attending: Physician Assistant | Admitting: Physician Assistant

## 2024-01-18 ENCOUNTER — Ambulatory Visit: Payer: PPO | Admitting: Physician Assistant

## 2024-01-22 ENCOUNTER — Telehealth: Payer: Self-pay | Admitting: Family

## 2024-01-22 NOTE — Telephone Encounter (Signed)
Patient is returning a call from the office patient would like a call back regarding this matter

## 2024-01-22 NOTE — Telephone Encounter (Signed)
LVM to call back.

## 2024-01-23 ENCOUNTER — Telehealth: Payer: Self-pay | Admitting: Family

## 2024-01-23 NOTE — Telephone Encounter (Signed)
Copied from CRM [id number]. Topic: General - Other >> Jan 22, 2024  5:05 PM Debra Killings wrote: Reason for CRM: Patient is returning a call and is requesting a callback tomorrow afternoon.

## 2024-01-25 ENCOUNTER — Telehealth: Payer: Self-pay | Admitting: Student in an Organized Health Care Education/Training Program

## 2024-01-25 NOTE — Telephone Encounter (Signed)
I just spoke with Carollee Herter in Intake at [facility]. She stated they do have the order to evaluate for 02 conserving device and she is reaching out to the RT to get the patient scheduled. If the RT needs anything else she will contact the office

## 2024-01-25 NOTE — Telephone Encounter (Signed)
The referral and notes have been faxed on 12/17/23 and 01/02/24 and I still can't get anyone to verify if the have the order. I think they are looking for something different then what we are sending to them. I am trying to call them again now

## 2024-01-25 NOTE — Telephone Encounter (Signed)
Patient needs oxygen prescription top be sent to [facility].Fax number [phone]

## 2024-01-28 NOTE — Telephone Encounter (Signed)
Pt has seen results. 

## 2024-01-28 NOTE — Telephone Encounter (Signed)
Pt has seen results as of 01/23/2024

## 2024-01-29 ENCOUNTER — Other Ambulatory Visit: Payer: PPO

## 2024-01-31 ENCOUNTER — Encounter: Payer: PPO | Admitting: Anesthesiology

## 2024-01-31 ENCOUNTER — Telehealth: Payer: Self-pay

## 2024-01-31 NOTE — Telephone Encounter (Signed)
 The patient has an appt today at 3, she left a vm saying she can't come in and wants to know if Dr. Welton Hall will call out her medicine until she can get back in here.

## 2024-01-31 NOTE — Telephone Encounter (Signed)
 I advised her that she needs to come in for an appt for the required UDS and pill count. She will call you back.

## 2024-02-04 ENCOUNTER — Other Ambulatory Visit: Payer: Self-pay | Admitting: *Deleted

## 2024-02-04 ENCOUNTER — Telehealth: Payer: Self-pay | Admitting: Anesthesiology

## 2024-02-04 DIAGNOSIS — J449 Chronic obstructive pulmonary disease, unspecified: Secondary | ICD-10-CM | POA: Diagnosis not present

## 2024-02-04 DIAGNOSIS — I671 Cerebral aneurysm, nonruptured: Secondary | ICD-10-CM | POA: Diagnosis not present

## 2024-02-04 DIAGNOSIS — G4733 Obstructive sleep apnea (adult) (pediatric): Secondary | ICD-10-CM | POA: Diagnosis not present

## 2024-02-04 NOTE — Telephone Encounter (Signed)
 Patient missed appt last week. I have rescheduled her for Monday at 1:40 with Dr Welton Hall, she is asking if he will send in one script for her to fill. Please advise patient

## 2024-02-04 NOTE — Progress Notes (Signed)
 Patient informed per voicemail that Hydrocodone  Dimaria not be "called in " outside of appt

## 2024-02-04 NOTE — Telephone Encounter (Signed)
 Rx request sent to Dr. Laban Emperor.

## 2024-02-04 NOTE — Progress Notes (Unsigned)
 Safety precautions to be maintained throughout the outpatient stay will include: orient to surroundings, keep bed in low position, maintain call bell within reach at all times, provide assistance with transfer out of bed and ambulation.

## 2024-02-06 ENCOUNTER — Other Ambulatory Visit: Payer: PPO

## 2024-02-06 ENCOUNTER — Other Ambulatory Visit: Payer: Self-pay | Admitting: Anesthesiology

## 2024-02-11 ENCOUNTER — Ambulatory Visit: Payer: Medicare Other | Attending: Anesthesiology | Admitting: Anesthesiology

## 2024-02-11 ENCOUNTER — Telehealth: Payer: Self-pay | Admitting: Anesthesiology

## 2024-02-11 ENCOUNTER — Encounter: Payer: Self-pay | Admitting: Anesthesiology

## 2024-02-11 VITALS — BP 125/72 | HR 112 | Temp 97.3°F | Resp 18 | Ht 65.0 in | Wt 157.0 lb

## 2024-02-11 DIAGNOSIS — M47812 Spondylosis without myelopathy or radiculopathy, cervical region: Secondary | ICD-10-CM | POA: Diagnosis not present

## 2024-02-11 DIAGNOSIS — G8929 Other chronic pain: Secondary | ICD-10-CM | POA: Insufficient documentation

## 2024-02-11 DIAGNOSIS — G894 Chronic pain syndrome: Secondary | ICD-10-CM | POA: Diagnosis not present

## 2024-02-11 DIAGNOSIS — M47816 Spondylosis without myelopathy or radiculopathy, lumbar region: Secondary | ICD-10-CM | POA: Diagnosis not present

## 2024-02-11 DIAGNOSIS — F1190 Opioid use, unspecified, uncomplicated: Secondary | ICD-10-CM | POA: Diagnosis present

## 2024-02-11 DIAGNOSIS — M5441 Lumbago with sciatica, right side: Secondary | ICD-10-CM | POA: Diagnosis not present

## 2024-02-11 DIAGNOSIS — Z79891 Long term (current) use of opiate analgesic: Secondary | ICD-10-CM | POA: Diagnosis not present

## 2024-02-11 DIAGNOSIS — M19012 Primary osteoarthritis, left shoulder: Secondary | ICD-10-CM

## 2024-02-11 DIAGNOSIS — M5136 Other intervertebral disc degeneration, lumbar region with discogenic back pain only: Secondary | ICD-10-CM | POA: Diagnosis not present

## 2024-02-11 DIAGNOSIS — M5442 Lumbago with sciatica, left side: Secondary | ICD-10-CM | POA: Diagnosis not present

## 2024-02-11 MED ORDER — HYDROCODONE-ACETAMINOPHEN 5-325 MG PO TABS
1.0000 | ORAL_TABLET | ORAL | 0 refills | Status: DC | PRN
Start: 2024-03-12 — End: 2024-04-07

## 2024-02-11 MED ORDER — HYDROCODONE-ACETAMINOPHEN 5-325 MG PO TABS: 1.0000 | ORAL_TABLET | ORAL | 0 refills | Status: AC | PRN

## 2024-02-11 MED ORDER — CYCLOBENZAPRINE HCL 10 MG PO TABS: 10.0000 mg | ORAL_TABLET | Freq: Every day | ORAL | 5 refills | Status: DC

## 2024-02-11 NOTE — Telephone Encounter (Signed)
 Pharmacy tech called from [facility] and has some questions about the medications that was send in today. Please give pharmacy a call. TY

## 2024-02-11 NOTE — Patient Instructions (Signed)
 Debra Hogan

## 2024-02-11 NOTE — Progress Notes (Signed)
 Subjective:  Patient ID: Debra Hogan, female    DOB: 02-Apr-1961  Age: 63 y.o. MRN: [id number]  CC: Back Pain (lower)   Procedure: None  HPI Athina D Stong presents for reevaluation.  She continues to have complaints of diffuse low back pain and some thoracic lower back pain with radiation into the bilateral hip buttocks and in the back legs.  No change in the distribution of her pain is noted.  No change in the symptom quality or characteristic.  She continues to take her medications as prescribed and these continue to work well for her.  Currently she has continued to require chronic opioid therapy however this is working well for whereas she has failed more conservative therapy.  She is due for refills for February 17 and March 19.  Otherwise she reports that she is in her usual state of health.  She is continuing to utilize home O2 secondary to ongoing chronic pulmonary issues but these also are stable and she follows with her pulmonologist as directed.  Outpatient Medications Prior to Visit  Medication Sig Dispense Refill   acyclovir (ZOVIRAX) 400 MG tablet Take 1 tablet (400 mg total) by mouth daily. 90 tablet 3   ALPRAZolam (XANAX) 0.25 MG tablet Take 1 tablet (0.25 mg total) by mouth daily as needed. for anxiety 30 tablet 1   aspirin EC 81 MG tablet Take 81 mg by mouth daily.     cholecalciferol (VITAMIN D) 1000 units tablet Take 1,000 Units by mouth daily.     cycloSPORINE (RESTASIS) 0.05 % ophthalmic emulsion 1 drop 2 (two) times daily.     diltiazem (CARDIZEM CD) 180 MG 24 hr capsule TAKE 1 CAPSULE BY MOUTH ONCE DAILY 90 capsule 3   escitalopram (LEXAPRO) 20 MG tablet Take 1 tablet (20 mg total) by mouth daily. 90 tablet 3   fluticasone-salmeterol (ADVAIR) 250-50 MCG/ACT AEPB INHALE 1 DOSE BY MOUTH TWICE DAILY AM  AND  BEDTIME 60 each 0   gabapentin (NEURONTIN) 100 MG capsule Take 1 capsule (100 mg total) by mouth 3 (three) times daily. 270 capsule 3   isosorbide mononitrate (IMDUR)  30 MG 24 hr tablet Take 1 tablet (30 mg total) by mouth daily. 90 tablet 3   lisinopril (ZESTRIL) 20 MG tablet Take 1 tablet (20 mg total) by mouth daily. 90 tablet 3   Multiple Vitamins-Minerals (CENTRUM SILVER ULTRA WOMENS) TABS Take 1 tablet by mouth daily.     nitroGLYCERIN (NITROSTAT) 0.4 MG SL tablet Place 1 tablet (0.4 mg total) under the tongue every 5 (five) minutes as needed for chest pain. 25 tablet 3   ondansetron (ZOFRAN-ODT) 4 MG disintegrating tablet Take 1 tablet (4 mg total) by mouth every 6 (six) hours as needed for nausea or vomiting. 20 tablet 2   PROAIR RESPICLICK 108 (90 Base) MCG/ACT AEPB INHALE 1 PUFF BY MOUTH EVERY 6 HOURS AS NEEDED FOR COUGH 1 each 0   rosuvastatin (CRESTOR) 20 MG tablet Take 1 tablet (20 mg total) by mouth daily. 90 tablet 3   Tiotropium Bromide Monohydrate (SPIRIVA RESPIMAT) 2.5 MCG/ACT AERS Inhale 2 puffs into the lungs daily. 60 each 12   cyclobenzaprine (FLEXERIL) 10 MG tablet Take 1 tablet (10 mg total) by mouth at bedtime. 30 tablet 5   benzonatate (TESSALON) 100 MG capsule Take 1 capsule (100 mg total) by mouth 2 (two) times daily as needed for cough. (Patient not taking: Reported on 02/11/2024) 20 capsule 0   nicotine polacrilex (NICOTINE MINI) 2  MG lozenge Take 1 lozenge (2 mg total) by mouth every 2 (two) hours as needed for smoking cessation. (Patient not taking: Reported on 02/11/2024) 72 lozenge 3   No facility-administered medications prior to visit.    Review of Systems CNS: No confusion or sedation Cardiac: No angina or palpitations GI: No abdominal pain or constipation Constitutional: No nausea vomiting fevers or chills  Objective:  BP 125/72 (Cuff Size: Normal)   Pulse (!) 112   Temp (!) 97.3 F (36.3 C) (Temporal)   Resp 18   Ht 5\' 5"  (1.651 m)   Wt 157 lb (71.2 kg)   SpO2 94% Comment: O2 at 2 liters BNC  BMI 26.13 kg/m    BP Readings from Last 3 Encounters:  02/11/24 125/72  01/03/24 122/78  12/12/23 116/84     Wt  Readings from Last 3 Encounters:  02/11/24 157 lb (71.2 kg)  01/03/24 151 lb 9.6 oz (68.8 kg)  12/12/23 155 lb (70.3 kg)     Physical Exam Pt is alert and oriented PERRL EOMI HEART IS RRR no murmur or rub LCTA no wheezing or rales MUSCULOSKELETAL reveals some paraspinous muscle tenderness in the lumbar region but no overt trigger points.  She is using a walker today for assistance.  Ambulation is with assistance.  Strength appears to be at baseline.  Labs  Lab Results  Component Value Date   HGBA1C 5.4 04/05/2022   HGBA1C 5.0 02/08/2021   HGBA1C 5.0 06/18/2020   Lab Results  Component Value Date   LDLCALC 149 (H) 01/03/2024   CREATININE 0.81 01/03/2024    -------------------------------------------------------------------------------------------------------------------- Lab Results  Component Value Date   WBC 7.0 01/03/2024   HGB 14.0 01/03/2024   HCT 41.3 01/03/2024   PLT 237.0 01/03/2024   GLUCOSE 90 01/03/2024   CHOL 257 (H) 01/03/2024   TRIG 252.0 (H) 01/03/2024   HDL 57.30 01/03/2024   LDLDIRECT 86.0 04/05/2022   LDLCALC 149 (H) 01/03/2024   ALT 13 01/03/2024   AST 24 01/03/2024   NA 142 01/03/2024   K 3.6 01/03/2024   CL 94 (L) 01/03/2024   CREATININE 0.81 01/03/2024   BUN 21 01/03/2024   CO2 39 (H) 01/03/2024   TSH 3.28 01/03/2024   INR 1.0 10/22/2015   HGBA1C 5.4 04/05/2022    --------------------------------------------------------------------------------------------------------------------- NM Myocar Multi W/Spect W/Wall Motion / EF Result Date: 05/29/2023   Normal pharmacologic myocardial perfusion stress test without significant ischemia or scar.   Left ventricular systolic function is normal (LVEF > 65%).   There is no significant coronary artery calcification noted on the attenuation correction CT.  Aortic atherosclerosis is noted.   Incidental note is made of a small pericardial effusion.   This is a low risk study.     Assessment & Plan:    Laretta was seen today for back pain.  Diagnoses and all orders for this visit:  Chronic bilateral low back pain with bilateral sciatica -     ToxASSURE Select 13 (MW), Urine  Chronic pain syndrome -     ToxASSURE Select 13 (MW), Urine  Chronic, continuous use of opioids -     ToxASSURE Select 13 (MW), Urine  Lumbar facet joint syndrome -     ToxASSURE Select 13 (MW), Urine  Primary osteoarthritis of left shoulder -     ToxASSURE Select 13 (MW), Urine  Facet arthropathy, cervical -     ToxASSURE Select 13 (MW), Urine  Degeneration of intervertebral disc of lumbar region with discogenic back  pain -     ToxASSURE Select 13 (MW), Urine  Other orders -     HYDROcodone-acetaminophen (NORCO/VICODIN) 5-325 MG tablet; Take 1 tablet by mouth every 4 (four) hours as needed for moderate pain (pain score 4-6) or severe pain (pain score 7-10). -     HYDROcodone-acetaminophen (NORCO/VICODIN) 5-325 MG tablet; Take 1 tablet by mouth every 4 (four) hours as needed for moderate pain (pain score 4-6) or severe pain (pain score 7-10). -     cyclobenzaprine (FLEXERIL) 10 MG tablet; Take 1 tablet (10 mg total) by mouth at bedtime.        ----------------------------------------------------------------------------------------------------------------------  Problem List Items Addressed This Visit       Unprioritized   Chronic pain syndrome   Relevant Medications   HYDROcodone-acetaminophen (NORCO/VICODIN) 5-325 MG tablet   HYDROcodone-acetaminophen (NORCO/VICODIN) 5-325 MG tablet (Start on 03/12/2024)   cyclobenzaprine (FLEXERIL) 10 MG tablet   Other Relevant Orders   ToxASSURE Select 13 (MW), Urine   Chronic, continuous use of opioids   Relevant Orders   ToxASSURE Select 13 (MW), Urine   DDD (degenerative disc disease), lumbar   Relevant Medications   HYDROcodone-acetaminophen (NORCO/VICODIN) 5-325 MG tablet   HYDROcodone-acetaminophen (NORCO/VICODIN) 5-325 MG tablet (Start on  03/12/2024)   cyclobenzaprine (FLEXERIL) 10 MG tablet   Other Relevant Orders   ToxASSURE Select 13 (MW), Urine   Facet arthropathy, cervical   Relevant Orders   ToxASSURE Select 13 (MW), Urine   Low back pain - Primary   Relevant Medications   HYDROcodone-acetaminophen (NORCO/VICODIN) 5-325 MG tablet   HYDROcodone-acetaminophen (NORCO/VICODIN) 5-325 MG tablet (Start on 03/12/2024)   cyclobenzaprine (FLEXERIL) 10 MG tablet   Other Relevant Orders   ToxASSURE Select 13 (MW), Urine   Lumbar facet joint syndrome   Relevant Medications   HYDROcodone-acetaminophen (NORCO/VICODIN) 5-325 MG tablet   HYDROcodone-acetaminophen (NORCO/VICODIN) 5-325 MG tablet (Start on 03/12/2024)   cyclobenzaprine (FLEXERIL) 10 MG tablet   Other Relevant Orders   ToxASSURE Select 13 (MW), Urine   Osteoarthritis of left shoulder   Relevant Medications   HYDROcodone-acetaminophen (NORCO/VICODIN) 5-325 MG tablet   HYDROcodone-acetaminophen (NORCO/VICODIN) 5-325 MG tablet (Start on 03/12/2024)   cyclobenzaprine (FLEXERIL) 10 MG tablet   Other Relevant Orders   ToxASSURE Select 13 (MW), Urine      ----------------------------------------------------------------------------------------------------------------------  1. Chronic bilateral low back pain with bilateral sciatica (Primary) Will continue with her current medication management.  She desires to defer on any type of interventional therapy.  I have encouraged her to continue with back stretching strengthening exercises as previously reviewed and TENS unit application as necessary.  Will continue with her current medication management with refills generated for February 17 and March 19. - ToxASSURE Select 13 (MW), Urine  2. Chronic pain syndrome I have reviewed the [facility] practitioner database information is appropriate for refill.  No other changes are initiated today. - ToxASSURE Select 13 (MW), Urine  3. Chronic, continuous use of opioids  -  ToxASSURE Select 13 (MW), Urine  4. Lumbar facet joint syndrome  - ToxASSURE Select 13 (MW), Urine  5. Primary osteoarthritis of left shoulder  - ToxASSURE Select 13 (MW), Urine  6. Facet arthropathy, cervical  - ToxASSURE Select 13 (MW), Urine  7. Degeneration of intervertebral disc of lumbar region with discogenic back pain  - ToxASSURE Select 13 (MW), Urine    ----------------------------------------------------------------------------------------------------------------------  I am having Darly D. Verdell start on HYDROcodone-acetaminophen and HYDROcodone-acetaminophen. I am also having her maintain her Centrum Silver [employer], cholecalciferol,  aspirin EC, ondansetron, escitalopram, acyclovir, diltiazem, rosuvastatin, lisinopril, gabapentin, benzonatate, ProAir RespiClick, fluticasone-salmeterol, nitroGLYCERIN, isosorbide mononitrate, Spiriva Respimat, nicotine polacrilex, ALPRAZolam, cycloSPORINE, and cyclobenzaprine.   Meds ordered this encounter  Medications   HYDROcodone-acetaminophen (NORCO/VICODIN) 5-325 MG tablet    Sig: Take 1 tablet by mouth every 4 (four) hours as needed for moderate pain (pain score 4-6) or severe pain (pain score 7-10).    Dispense:  130 tablet    Refill:  0   HYDROcodone-acetaminophen (NORCO/VICODIN) 5-325 MG tablet    Sig: Take 1 tablet by mouth every 4 (four) hours as needed for moderate pain (pain score 4-6) or severe pain (pain score 7-10).    Dispense:  130 tablet    Refill:  0   cyclobenzaprine (FLEXERIL) 10 MG tablet    Sig: Take 1 tablet (10 mg total) by mouth at bedtime.    Dispense:  30 tablet    Refill:  5   Patient's Medications  New Prescriptions   HYDROCODONE-ACETAMINOPHEN (NORCO/VICODIN) 5-325 MG TABLET    Take 1 tablet by mouth every 4 (four) hours as needed for moderate pain (pain score 4-6) or severe pain (pain score 7-10).   HYDROCODONE-ACETAMINOPHEN (NORCO/VICODIN) 5-325 MG TABLET    Take 1 tablet by mouth every 4  (four) hours as needed for moderate pain (pain score 4-6) or severe pain (pain score 7-10).  Previous Medications   ACYCLOVIR (ZOVIRAX) 400 MG TABLET    Take 1 tablet (400 mg total) by mouth daily.   ALPRAZOLAM (XANAX) 0.25 MG TABLET    Take 1 tablet (0.25 mg total) by mouth daily as needed. for anxiety   ASPIRIN EC 81 MG TABLET    Take 81 mg by mouth daily.   BENZONATATE (TESSALON) 100 MG CAPSULE    Take 1 capsule (100 mg total) by mouth 2 (two) times daily as needed for cough.   CHOLECALCIFEROL (VITAMIN D) 1000 UNITS TABLET    Take 1,000 Units by mouth daily.   CYCLOSPORINE (RESTASIS) 0.05 % OPHTHALMIC EMULSION    1 drop 2 (two) times daily.   DILTIAZEM (CARDIZEM CD) 180 MG 24 HR CAPSULE    TAKE 1 CAPSULE BY MOUTH ONCE DAILY   ESCITALOPRAM (LEXAPRO) 20 MG TABLET    Take 1 tablet (20 mg total) by mouth daily.   FLUTICASONE-SALMETEROL (ADVAIR) 250-50 MCG/ACT AEPB    INHALE 1 DOSE BY MOUTH TWICE DAILY AM  AND  BEDTIME   GABAPENTIN (NEURONTIN) 100 MG CAPSULE    Take 1 capsule (100 mg total) by mouth 3 (three) times daily.   ISOSORBIDE MONONITRATE (IMDUR) 30 MG 24 HR TABLET    Take 1 tablet (30 mg total) by mouth daily.   LISINOPRIL (ZESTRIL) 20 MG TABLET    Take 1 tablet (20 mg total) by mouth daily.   MULTIPLE VITAMINS-MINERALS (CENTRUM SILVER ULTRA WOMENS) TABS    Take 1 tablet by mouth daily.   NICOTINE POLACRILEX (NICOTINE MINI) 2 MG LOZENGE    Take 1 lozenge (2 mg total) by mouth every 2 (two) hours as needed for smoking cessation.   NITROGLYCERIN (NITROSTAT) 0.4 MG SL TABLET    Place 1 tablet (0.4 mg total) under the tongue every 5 (five) minutes as needed for chest pain.   ONDANSETRON (ZOFRAN-ODT) 4 MG DISINTEGRATING TABLET    Take 1 tablet (4 mg total) by mouth every 6 (six) hours as needed for nausea or vomiting.   PROAIR RESPICLICK 108 (90 BASE) MCG/ACT AEPB    INHALE 1 PUFF BY  MOUTH EVERY 6 HOURS AS NEEDED FOR COUGH   ROSUVASTATIN (CRESTOR) 20 MG TABLET    Take 1 tablet (20 mg total) by  mouth daily.   TIOTROPIUM BROMIDE MONOHYDRATE (SPIRIVA RESPIMAT) 2.5 MCG/ACT AERS    Inhale 2 puffs into the lungs daily.  Modified Medications   Modified Medication Previous Medication   CYCLOBENZAPRINE (FLEXERIL) 10 MG TABLET cyclobenzaprine (FLEXERIL) 10 MG tablet      Take 1 tablet (10 mg total) by mouth at bedtime.    Take 1 tablet (10 mg total) by mouth at bedtime.  Discontinued Medications   No medications on file   ----------------------------------------------------------------------------------------------------------------------  Follow-up: Return in about 2 months (around 04/10/2024) for evaluation, med refill.  Continue follow-up with her primary care physicians for baseline medical care with return to clinic scheduled in 2 months  Yevette Edwards, MD

## 2024-02-11 NOTE — Progress Notes (Signed)
 Nursing Pain Medication Assessment:  Safety precautions to be maintained throughout the outpatient stay will include: orient to surroundings, keep bed in low position, maintain call bell within reach at all times, provide assistance with transfer out of bed and ambulation.  Medication Inspection Compliance: Pill count conducted under aseptic conditions, in front of the patient. Neither the pills nor the bottle was removed from the patient's sight at any time. Once count was completed pills were immediately returned to the patient in their original bottle.  Medication: Hydrocodone/APAP Pill/Patch Count:  0 of 130 pills remain Pill/Patch Appearance: Markings consistent with prescribed medication Bottle Appearance: Standard pharmacy container. Clearly labeled. Filled Date: 5 / 23 / 2024 Last Medication intake:  Ran out of medicine more than 48 hours agoSafety precautions to be maintained throughout the outpatient stay will include: orient to surroundings, keep bed in low position, maintain call bell within reach at all times, provide assistance with transfer out of bed and ambulation.

## 2024-02-12 NOTE — Telephone Encounter (Signed)
 I have spoke with the patient and no one has called to schedule her walk test to do POC Eval. I then called [facility] and spoke with [place] and she was going to get the RT to call the patient and schedule the walk for her

## 2024-02-13 ENCOUNTER — Other Ambulatory Visit: Payer: Self-pay | Admitting: *Deleted

## 2024-02-13 LAB — TOXASSURE SELECT 13 (MW), URINE

## 2024-02-13 MED ORDER — TIZANIDINE HCL 4 MG PO TABS: 4.0000 mg | ORAL_TABLET | Freq: Three times a day (TID) | ORAL | 1 refills | Status: DC

## 2024-02-13 MED ORDER — TIZANIDINE HCL 4 MG PO TABS: 4.0000 mg | ORAL_TABLET | Freq: Every day | ORAL | 2 refills | Status: DC

## 2024-02-13 NOTE — Addendum Note (Signed)
 Addended by: Yevette Edwards on: 02/13/2024 01:17 PM   Modules accepted: Orders

## 2024-02-13 NOTE — Progress Notes (Unsigned)
 Safety precautions to be maintained throughout the outpatient stay will include: orient to surroundings, keep bed in low position, maintain call bell within reach at all times, provide assistance with transfer out of bed and ambulation.

## 2024-02-16 ENCOUNTER — Encounter: Payer: Self-pay | Admitting: Anesthesiology

## 2024-02-19 ENCOUNTER — Telehealth: Payer: Self-pay | Admitting: Student in an Organized Health Care Education/Training Program

## 2024-02-19 ENCOUNTER — Encounter: Payer: Self-pay | Admitting: *Deleted

## 2024-02-19 NOTE — Telephone Encounter (Signed)
 Patient is changing pharmacy to [facility] in [place]. Please send her scripts there

## 2024-02-20 ENCOUNTER — Other Ambulatory Visit: Payer: Self-pay | Admitting: *Deleted

## 2024-02-20 ENCOUNTER — Ambulatory Visit: Payer: Medicare Other

## 2024-02-20 NOTE — Telephone Encounter (Signed)
Rx request sent to Dr. Adams. 

## 2024-02-25 ENCOUNTER — Ambulatory Visit: Payer: PPO | Admitting: Medical

## 2024-02-28 MED ORDER — CYCLOBENZAPRINE HCL 10 MG PO TABS: 10.0000 mg | ORAL_TABLET | Freq: Every day | ORAL | 5 refills | Status: AC

## 2024-03-03 DIAGNOSIS — I671 Cerebral aneurysm, nonruptured: Secondary | ICD-10-CM | POA: Diagnosis not present

## 2024-03-03 DIAGNOSIS — J449 Chronic obstructive pulmonary disease, unspecified: Secondary | ICD-10-CM | POA: Diagnosis not present

## 2024-03-03 DIAGNOSIS — G4733 Obstructive sleep apnea (adult) (pediatric): Secondary | ICD-10-CM | POA: Diagnosis not present

## 2024-03-06 ENCOUNTER — Telehealth: Payer: Self-pay

## 2024-03-06 ENCOUNTER — Other Ambulatory Visit: Payer: Self-pay | Admitting: Family

## 2024-03-06 DIAGNOSIS — F4321 Adjustment disorder with depressed mood: Secondary | ICD-10-CM

## 2024-03-06 DIAGNOSIS — F32A Depression, unspecified: Secondary | ICD-10-CM

## 2024-03-06 MED ORDER — ALPRAZOLAM 0.25 MG PO TABS: 0.2500 mg | ORAL_TABLET | Freq: Two times a day (BID) | ORAL | 2 refills | Status: DC | PRN

## 2024-03-06 MED ORDER — ALPRAZOLAM 0.25 MG PO TABS: 0.2500 mg | ORAL_TABLET | Freq: Every day | ORAL | 2 refills | Status: DC | PRN

## 2024-03-06 NOTE — Addendum Note (Signed)
 Addended by: Allegra Grana on: 03/06/2024 11:15 AM   Modules accepted: Orders

## 2024-03-06 NOTE — Telephone Encounter (Signed)
 Copied from CRM [id number]. Topic: Clinical - Medication Question >> Mar 06, 2024 10:01 AM Debra Hogan wrote: Reason for CRM: Patient is calling to speak with Claris Che Arnett's nurse regarding some questions she has about her ALPRAZolam Prudy Feeler) 0.25 MG tablet prescription. The pharmacy is going to send a refill request to the office but she would really like to speak with the nurse prior to filling the prescription.

## 2024-03-06 NOTE — Telephone Encounter (Signed)
 PT has been notified and has 3 mnth f/up scheduled

## 2024-03-06 NOTE — Telephone Encounter (Signed)
 Call patient Please express my condolences in regards to the loss of her mom.  I am just so sorry.  I refilled Xanax.  There are also 2 refills on the bottle as well  Ensure she has f/u 3 months after last visit with me

## 2024-03-06 NOTE — Telephone Encounter (Signed)
 Spoke to pt she stated that she is out of medication, pt stated that you and her had discussed increasing her doseage she stated that she really needs it now increased due to her Mom passing on Monday. Rx needs to state more than once daily

## 2024-03-13 ENCOUNTER — Ambulatory Visit: Payer: Medicare Other

## 2024-03-24 ENCOUNTER — Ambulatory Visit: Payer: PPO | Admitting: Medical

## 2024-03-29 ENCOUNTER — Other Ambulatory Visit: Payer: Self-pay | Admitting: Family

## 2024-03-29 DIAGNOSIS — A6004 Herpesviral vulvovaginitis: Secondary | ICD-10-CM

## 2024-03-31 ENCOUNTER — Telehealth: Payer: Self-pay | Admitting: Family

## 2024-03-31 DIAGNOSIS — A6004 Herpesviral vulvovaginitis: Secondary | ICD-10-CM

## 2024-03-31 NOTE — Telephone Encounter (Unsigned)
 Copied from CRM [id number]. Topic: Clinical - Medication Refill >> Mar 31, 2024  3:31 PM Alcus Dad wrote: Most Recent Primary Care Visit:  Provider: Allegra Grana  Department: [facility]  Visit Type: PHYSICAL  Date: 01/03/2024  Medication: acyclovir (ZOVIRAX) 400 MG tablet    Has the patient contacted their pharmacy? Yes (Agent: If no, request that the patient contact the pharmacy for the refill. If patient does not wish to contact the pharmacy document the reason why and proceed with request.) (Agent: If yes, when and what did the pharmacy advise?)  Is this the correct pharmacy for this prescription? Yes If no, delete pharmacy and type the correct one.  This is the patient's preferred pharmacy:  [facility] [address] [place] - [address] [place] [address] Phone: [phone] Fax: [phone]   Has the prescription been filled recently? No  Is the patient out of the medication? Yes  Has the patient been seen for an appointment in the last year OR does the patient have an upcoming appointment? Yes  Can we respond through MyChart? Yes  Agent: Please be advised that Rx refills [place] take up to 3 business days. We ask that you follow-up with your pharmacy. >> Mar 31, 2024  3:37 PM Isabelle Course C wrote:  acyclovir (ZOVIRAX) 400 MG tablet Patient is taking this medication for preventative measures          >> Mar 31, 2024  3:36 PM Isabelle Course C wrote: Taking medication

## 2024-03-31 NOTE — Telephone Encounter (Unsigned)
 Copied from CRM [id number]. Topic: Clinical - Medication Refill >> Mar 31, 2024  3:31 PM Alcus Dad wrote: Most Recent Primary Care Visit:  Provider: Allegra Grana  Department: [facility]  Visit Type: PHYSICAL  Date: 01/03/2024  Medication: acyclovir (ZOVIRAX) 400 MG tablet    Has the patient contacted their pharmacy? Yes (Agent: If no, request that the patient contact the pharmacy for the refill. If patient does not wish to contact the pharmacy document the reason why and proceed with request.) (Agent: If yes, when and what did the pharmacy advise?)  Is this the correct pharmacy for this prescription? Yes If no, delete pharmacy and type the correct one.  This is the patient's preferred pharmacy:  [facility] [address] [place] - [address] [place] [address] Phone: [phone] Fax: [phone]   Has the prescription been filled recently? No  Is the patient out of the medication? Yes  Has the patient been seen for an appointment in the last year OR does the patient have an upcoming appointment? Yes  Can we respond through MyChart? Yes  Agent: Please be advised that Rx refills [place] take up to 3 business days. We ask that you follow-up with your pharmacy.

## 2024-04-01 MED ORDER — ACYCLOVIR 400 MG PO TABS: 400.0000 mg | ORAL_TABLET | Freq: Every day | ORAL | 3 refills | Status: DC

## 2024-04-01 NOTE — Telephone Encounter (Signed)
 Pt has appt scheduled for 04/03/24

## 2024-04-01 NOTE — Telephone Encounter (Signed)
 Refilled acyclovir  Pt needs an appt every 3 months , can alternate with virtual, due to controlled substance ( xanax) which I prescribe  Please sch

## 2024-04-03 ENCOUNTER — Ambulatory Visit: Payer: PPO | Admitting: Family

## 2024-04-03 DIAGNOSIS — J449 Chronic obstructive pulmonary disease, unspecified: Secondary | ICD-10-CM | POA: Diagnosis not present

## 2024-04-03 DIAGNOSIS — G4733 Obstructive sleep apnea (adult) (pediatric): Secondary | ICD-10-CM | POA: Diagnosis not present

## 2024-04-03 DIAGNOSIS — I671 Cerebral aneurysm, nonruptured: Secondary | ICD-10-CM | POA: Diagnosis not present

## 2024-04-07 ENCOUNTER — Ambulatory Visit: Attending: Anesthesiology | Admitting: Anesthesiology

## 2024-04-07 ENCOUNTER — Encounter: Payer: Self-pay | Admitting: Anesthesiology

## 2024-04-07 DIAGNOSIS — G894 Chronic pain syndrome: Secondary | ICD-10-CM | POA: Diagnosis not present

## 2024-04-07 DIAGNOSIS — M5442 Lumbago with sciatica, left side: Secondary | ICD-10-CM | POA: Diagnosis not present

## 2024-04-07 DIAGNOSIS — M5136 Other intervertebral disc degeneration, lumbar region with discogenic back pain only: Secondary | ICD-10-CM | POA: Diagnosis not present

## 2024-04-07 DIAGNOSIS — Z79891 Long term (current) use of opiate analgesic: Secondary | ICD-10-CM | POA: Diagnosis not present

## 2024-04-07 DIAGNOSIS — M47812 Spondylosis without myelopathy or radiculopathy, cervical region: Secondary | ICD-10-CM

## 2024-04-07 DIAGNOSIS — M47816 Spondylosis without myelopathy or radiculopathy, lumbar region: Secondary | ICD-10-CM | POA: Diagnosis not present

## 2024-04-07 DIAGNOSIS — M19012 Primary osteoarthritis, left shoulder: Secondary | ICD-10-CM | POA: Diagnosis not present

## 2024-04-07 DIAGNOSIS — M5441 Lumbago with sciatica, right side: Secondary | ICD-10-CM

## 2024-04-07 DIAGNOSIS — F1190 Opioid use, unspecified, uncomplicated: Secondary | ICD-10-CM

## 2024-04-07 DIAGNOSIS — G8929 Other chronic pain: Secondary | ICD-10-CM

## 2024-04-07 MED ORDER — HYDROCODONE-ACETAMINOPHEN 5-325 MG PO TABS
1.0000 | ORAL_TABLET | ORAL | 0 refills | Status: DC | PRN
Start: 2024-05-21 — End: 2024-06-02

## 2024-04-07 MED ORDER — HYDROCODONE-ACETAMINOPHEN 5-325 MG PO TABS: 1.0000 | ORAL_TABLET | ORAL | 0 refills | Status: AC | PRN

## 2024-04-07 NOTE — Progress Notes (Unsigned)
 Virtual Visit via Telephone Note  I connected with Debra Hogan on 04/07/24 at  1:20 PM EDT by telephone and verified that I am speaking with the correct person using two identifiers.  Location: Patient: Home Provider: Pain control center   I discussed the limitations, risks, security and privacy concerns of performing an evaluation and management service by telephone and the availability of in person appointments. I also discussed with the patient that there Debra Hogan be a patient responsible charge related to this service. The patient expressed understanding and agreed to proceed.   History of Present Illness: I spoke to Debra Hogan via telephone as we were unable link for the video portion of the conference.  She reports that her low back pain has been more severe recently.  Similar quality but more spasming.  She is not having a lot of sciatica.  The quality of the pain she has is comparable and she takes her Vicodin as prescribed generally getting about 50% improvement lasting about 4 to 6 hours before she has recurrence of her baseline pain.  Otherwise she is in her usual state of health.  No change in lower extremity strength function bowel or bladder function is noted.  Review of systems: General: No fevers or chills Pulmonary: No shortness of breath or dyspnea Cardiac: No angina or palpitations or lightheadedness GI: No abdominal pain or constipation Psych: No depression    Observations/Objective:  Current Outpatient Medications:    [START ON 05/21/2024] HYDROcodone-acetaminophen (NORCO/VICODIN) 5-325 MG tablet, Take 1 tablet by mouth every 4 (four) hours as needed for moderate pain (pain score 4-6) or severe pain (pain score 7-10)., Disp: 130 tablet, Rfl: 0   acyclovir (ZOVIRAX) 400 MG tablet, Take 1 tablet (400 mg total) by mouth daily., Disp: 90 tablet, Rfl: 3   ALPRAZolam (XANAX) 0.25 MG tablet, Take 1 tablet (0.25 mg total) by mouth 2 (two) times daily as needed. for anxiety, Disp: 60  tablet, Rfl: 2   aspirin EC 81 MG tablet, Take 81 mg by mouth daily., Disp: , Rfl:    benzonatate (TESSALON) 100 MG capsule, Take 1 capsule (100 mg total) by mouth 2 (two) times daily as needed for cough. (Patient not taking: Reported on 02/11/2024), Disp: 20 capsule, Rfl: 0   cholecalciferol (VITAMIN D) 1000 units tablet, Take 1,000 Units by mouth daily., Disp: , Rfl:    cyclobenzaprine (FLEXERIL) 10 MG tablet, Take 1 tablet (10 mg total) by mouth at bedtime., Disp: 30 tablet, Rfl: 5   cycloSPORINE (RESTASIS) 0.05 % ophthalmic emulsion, 1 drop 2 (two) times daily., Disp: , Rfl:    diltiazem (CARDIZEM CD) 180 MG 24 hr capsule, TAKE 1 CAPSULE BY MOUTH ONCE DAILY, Disp: 90 capsule, Rfl: 3   escitalopram (LEXAPRO) 20 MG tablet, Take 1 tablet (20 mg total) by mouth daily., Disp: 90 tablet, Rfl: 3   fluticasone-salmeterol (ADVAIR) 250-50 MCG/ACT AEPB, INHALE 1 DOSE BY MOUTH TWICE DAILY AM  AND  BEDTIME, Disp: 60 each, Rfl: 0   gabapentin (NEURONTIN) 100 MG capsule, Take 1 capsule (100 mg total) by mouth 3 (three) times daily., Disp: 270 capsule, Rfl: 3   [START ON 04/21/2024] HYDROcodone-acetaminophen (NORCO/VICODIN) 5-325 MG tablet, Take 1 tablet by mouth every 4 (four) hours as needed for moderate pain (pain score 4-6) or severe pain (pain score 7-10)., Disp: 130 tablet, Rfl: 0   isosorbide mononitrate (IMDUR) 30 MG 24 hr tablet, Take 1 tablet (30 mg total) by mouth daily., Disp: 90 tablet, Rfl: 3  lisinopril (ZESTRIL) 20 MG tablet, Take 1 tablet (20 mg total) by mouth daily., Disp: 90 tablet, Rfl: 3   Multiple Vitamins-Minerals (CENTRUM SILVER ULTRA WOMENS) TABS, Take 1 tablet by mouth daily., Disp: , Rfl:    nitroGLYCERIN (NITROSTAT) 0.4 MG SL tablet, Place 1 tablet (0.4 mg total) under the tongue every 5 (five) minutes as needed for chest pain., Disp: 25 tablet, Rfl: 3   ondansetron (ZOFRAN-ODT) 4 MG disintegrating tablet, Take 1 tablet (4 mg total) by mouth every 6 (six) hours as needed for nausea or  vomiting., Disp: 20 tablet, Rfl: 2   PROAIR RESPICLICK 108 (90 Base) MCG/ACT AEPB, INHALE 1 PUFF BY MOUTH EVERY 6 HOURS AS NEEDED FOR COUGH, Disp: 1 each, Rfl: 0   rosuvastatin (CRESTOR) 20 MG tablet, Take 1 tablet (20 mg total) by mouth daily., Disp: 90 tablet, Rfl: 3   Tiotropium Bromide Monohydrate (SPIRIVA RESPIMAT) 2.5 MCG/ACT AERS, Inhale 2 puffs into the lungs daily., Disp: 60 each, Rfl: 12   tiZANidine (ZANAFLEX) 4 MG tablet, Take 1 tablet (4 mg total) by mouth at bedtime., Disp: 30 tablet, Rfl: 2   Past Medical History:  Diagnosis Date   Anginal pain (HCC)    Arthritis    COPD (chronic obstructive pulmonary disease) (HCC)    Depression    Dyspnea    Fibromyalgia    Headache    Heat stroke 2006   Irritable bowel syndrome    MS (multiple sclerosis) (HCC)    Followed by Dr. Mason Hogan   Osteoarthritis of left shoulder 01/12/2022   Assessment and Plan:  1. Chronic bilateral low back pain with bilateral sciatica   2. Chronic pain syndrome   3. Chronic, continuous use of opioids   4. Lumbar facet joint syndrome   5. Primary osteoarthritis of left shoulder   6. Facet arthropathy, cervical   7. Degeneration of intervertebral disc of lumbar region with discogenic back pain    Patient our conversation gets appropriate to refill her medicines.  She is doing well with chronic opioid therapy and has failed more conservative therapy.  She still gets good relief with no side effects reported.  I have reviewed the Debra Hogan  practitioner database information as appropriate.  Refills to be generated for April 28 and Debra Hogan 28.  Continue with stretching strengthening as reviewed today with increased activity especially with the warmer weather.  Should she need to, I have given her the option to return for an epidural steroid.  She had good relief with these back in 2021 and showed the sciatica symptoms intensify this would be reasonable to pursue.  Continue follow-up with her primary care physician  for baseline medical care. Follow Up Instructions:    I discussed the assessment and treatment plan with the patient. The patient was provided an opportunity to ask questions and all were answered. The patient agreed with the plan and demonstrated an understanding of the instructions.   The patient was advised to call back or seek an in-person evaluation if the symptoms worsen or if the condition fails to improve as anticipated.  I provided 30 minutes of non-face-to-face time during this encounter.   Zula Hitch, MD

## 2024-04-08 ENCOUNTER — Ambulatory Visit

## 2024-04-16 ENCOUNTER — Ambulatory Visit: Admitting: Medical

## 2024-04-17 ENCOUNTER — Other Ambulatory Visit: Payer: Self-pay | Admitting: Acute Care

## 2024-04-17 ENCOUNTER — Telehealth: Payer: Self-pay | Admitting: Student in an Organized Health Care Education/Training Program

## 2024-04-17 DIAGNOSIS — F17210 Nicotine dependence, cigarettes, uncomplicated: Secondary | ICD-10-CM

## 2024-04-17 DIAGNOSIS — Z87891 Personal history of nicotine dependence: Secondary | ICD-10-CM

## 2024-04-17 DIAGNOSIS — Z122 Encounter for screening for malignant neoplasm of respiratory organs: Secondary | ICD-10-CM

## 2024-04-17 NOTE — Telephone Encounter (Signed)
 Dr. Darnelle Elders you referred this patient to [facility]. We have received a note from them stating they spoke with Debra Hogan in early January 2025 and she requested to be deferred. We recently began trying to reach out to her again and have not received a call back.

## 2024-04-17 NOTE — Telephone Encounter (Signed)
 NFN

## 2024-04-21 ENCOUNTER — Ambulatory Visit: Admitting: Family

## 2024-04-29 ENCOUNTER — Ambulatory Visit: Admitting: Family

## 2024-04-29 ENCOUNTER — Encounter: Payer: Self-pay | Admitting: Family

## 2024-04-29 ENCOUNTER — Ambulatory Visit

## 2024-04-29 VITALS — BP 130/70 | HR 94 | Temp 98.3°F | Ht 62.0 in | Wt 149.0 lb

## 2024-04-29 DIAGNOSIS — I700 Atherosclerosis of aorta: Secondary | ICD-10-CM

## 2024-04-29 DIAGNOSIS — M25842 Other specified joint disorders, left hand: Secondary | ICD-10-CM | POA: Diagnosis not present

## 2024-04-29 DIAGNOSIS — I10 Essential (primary) hypertension: Secondary | ICD-10-CM | POA: Diagnosis not present

## 2024-04-29 DIAGNOSIS — M5442 Lumbago with sciatica, left side: Secondary | ICD-10-CM

## 2024-04-29 DIAGNOSIS — G8929 Other chronic pain: Secondary | ICD-10-CM

## 2024-04-29 DIAGNOSIS — R000 Tachycardia, unspecified: Secondary | ICD-10-CM

## 2024-04-29 DIAGNOSIS — A6004 Herpesviral vulvovaginitis: Secondary | ICD-10-CM

## 2024-04-29 DIAGNOSIS — F4320 Adjustment disorder, unspecified: Secondary | ICD-10-CM | POA: Diagnosis not present

## 2024-04-29 DIAGNOSIS — F32A Depression, unspecified: Secondary | ICD-10-CM

## 2024-04-29 DIAGNOSIS — R899 Unspecified abnormal finding in specimens from other organs, systems and tissues: Secondary | ICD-10-CM

## 2024-04-29 DIAGNOSIS — M5441 Lumbago with sciatica, right side: Secondary | ICD-10-CM | POA: Diagnosis not present

## 2024-04-29 DIAGNOSIS — F419 Anxiety disorder, unspecified: Secondary | ICD-10-CM | POA: Diagnosis not present

## 2024-04-29 DIAGNOSIS — M1812 Unilateral primary osteoarthritis of first carpometacarpal joint, left hand: Secondary | ICD-10-CM | POA: Diagnosis not present

## 2024-04-29 DIAGNOSIS — F411 Generalized anxiety disorder: Secondary | ICD-10-CM

## 2024-04-29 DIAGNOSIS — M779 Enthesopathy, unspecified: Secondary | ICD-10-CM | POA: Diagnosis not present

## 2024-04-29 LAB — URINALYSIS, ROUTINE W REFLEX MICROSCOPIC
Bilirubin Urine: NEGATIVE
Hgb urine dipstick: NEGATIVE
Ketones, ur: NEGATIVE
Leukocytes,Ua: NEGATIVE
Nitrite: NEGATIVE
RBC / HPF: NONE SEEN (ref 0–?)
Specific Gravity, Urine: 1.025 (ref 1.000–1.030)
Total Protein, Urine: 100 — AB
Urine Glucose: NEGATIVE
Urobilinogen, UA: 0.2 (ref 0.0–1.0)
pH: 6 (ref 5.0–8.0)

## 2024-04-29 LAB — LIPID PANEL
Cholesterol: 201 mg/dL — ABNORMAL HIGH (ref 0–200)
HDL: 48.9 mg/dL (ref >39.00)
LDL Cholesterol: 112 mg/dL — ABNORMAL HIGH (ref 0–99)
NonHDL: 152.57
Total CHOL/HDL Ratio: 4
Triglycerides: 201 mg/dL — ABNORMAL HIGH (ref 0.0–149.0)
VLDL: 40.2 mg/dL — ABNORMAL HIGH (ref 0.0–40.0)

## 2024-04-29 MED ORDER — ALPRAZOLAM 0.25 MG PO TABS: 0.2500 mg | ORAL_TABLET | Freq: Two times a day (BID) | ORAL | 2 refills | Status: DC | PRN

## 2024-04-29 MED ORDER — ACYCLOVIR 400 MG PO TABS
400.0000 mg | ORAL_TABLET | Freq: Every day | ORAL | 3 refills | Status: AC
Start: 2024-04-29 — End: ?

## 2024-04-29 MED ORDER — DILTIAZEM HCL ER COATED BEADS 180 MG PO CP24
ORAL_CAPSULE | ORAL | 3 refills | Status: AC
Start: 2024-04-29 — End: ?

## 2024-04-29 MED ORDER — GABAPENTIN 100 MG PO CAPS: 100.0000 mg | ORAL_CAPSULE | Freq: Three times a day (TID) | ORAL | 3 refills | Status: AC

## 2024-04-29 MED ORDER — ESCITALOPRAM OXALATE 20 MG PO TABS: 20.0000 mg | ORAL_TABLET | Freq: Every day | ORAL | 3 refills | Status: AC

## 2024-04-29 MED ORDER — ROSUVASTATIN CALCIUM 20 MG PO TABS: 20.0000 mg | ORAL_TABLET | Freq: Every day | ORAL | 3 refills | Status: DC

## 2024-04-29 MED ORDER — LISINOPRIL 20 MG PO TABS: 20.0000 mg | ORAL_TABLET | Freq: Every day | ORAL | 3 refills | Status: DC

## 2024-04-29 NOTE — Progress Notes (Signed)
 Assessment & Plan:  Cyst of joint of left hand Assessment & Plan: Etiology of symptoms is nonspecific at this time.  No known injury.  Acute nature would suggest hematoma.  Alternatively consider ganglion.  Referral orthopedic.  Continue icing  Orders: -     Ambulatory referral to Orthopedic Surgery -     DG Hand Complete Left; Future  Anxiety and depression Assessment & Plan: Chronic, stable. Continue Lexapro  20 mg qd and xanax  0.25mg  BID.    Orders: -     ALPRAZolam ; Take 1 tablet (0.25 mg total) by mouth 2 (two) times daily as needed. for anxiety  Dispense: 60 tablet; Refill: 2  Grief reaction -     ALPRAZolam ; Take 1 tablet (0.25 mg total) by mouth 2 (two) times daily as needed. for anxiety  Dispense: 60 tablet; Refill: 2  Atherosclerosis of aorta (HCC) -     Lipid panel -     Rosuvastatin  Calcium ; Take 1 tablet (20 mg total) by mouth daily.  Dispense: 90 tablet; Refill: 3  Chronic bilateral low back pain with bilateral sciatica -     Gabapentin ; Take 1 capsule (100 mg total) by mouth 3 (three) times daily.  Dispense: 270 capsule; Refill: 3  Generalized anxiety disorder -     Escitalopram  Oxalate; Take 1 tablet (20 mg total) by mouth daily.  Dispense: 90 tablet; Refill: 3  Herpes simplex vulvovaginitis -     Acyclovir ; Take 1 tablet (400 mg total) by mouth daily.  Dispense: 90 tablet; Refill: 3  Primary hypertension Assessment & Plan: Chronic, stable. Continue cardizem  180mg , lisinopril  20mg  and Imdur  30mg  ( started by cardiology 11/2023)   Orders: -     Lisinopril ; Take 1 tablet (20 mg total) by mouth daily.  Dispense: 90 tablet; Refill: 3  Sinus tachycardia -     dilTIAZem  HCl ER Coated Beads; TAKE 1 CAPSULE BY MOUTH ONCE DAILY  Dispense: 90 capsule; Refill: 3  Abnormal laboratory test -     Urinalysis, Routine w reflex microscopic     Return precautions given.   Risks, benefits, and alternatives of the medications and treatment plan prescribed today were  discussed, and patient expressed understanding.   Education regarding symptom management and diagnosis given to patient on AVS either electronically or printed.  Return in about 3 months (around 07/30/2024).  Bascom Bossier, FNP  Subjective:    Patient ID: Debra Hogan, female    DOB: 06/24/1961, 63 y.o.   MRN: [id number]  CC: Debra Hogan is a 63 y.o. female who presents today for 1 year  follow up.   HPI: Complains of left hand pain x one week  She had sudden onset of hand swelling and generalized pain. She has one area that remains swollen.   She has used ice with improvement  No injury.   Hand nor specific joint is not hot; no  draining, red streaks.   No h/o gout.    CP has significantly improved on Imdur   Compliant with crestor  20mg  every day.   Compliant with Lexapro  20 mg daily, Xanax  0.25 mg twice daily   She remains compliant with Cardizem  180 mg daily, lisinopril  20 mg daily   following with pulmonology, Dr Sueanne Emerald;  she is compliant with Spiriva , [place]. SOB at baseline  Follow-up with pain management, Dr. Welton Hall  Started imdur  30 mg every day 12/19/24 cardiology follow-up 12/10/2023  Requesting refills today   Allergies: Valium [diazepam] Current Outpatient Medications on File Prior to Visit  Medication Sig Dispense Refill   aspirin  EC 81 MG tablet Take 81 mg by mouth daily.     cholecalciferol (VITAMIN D ) 1000 units tablet Take 1,000 Units by mouth daily.     cyclobenzaprine  (FLEXERIL ) 10 MG tablet Take 1 tablet (10 mg total) by mouth at bedtime. 30 tablet 5   cycloSPORINE (RESTASIS) 0.05 % ophthalmic emulsion 1 drop 2 (two) times daily.     HYDROcodone -acetaminophen  (NORCO/VICODIN) 5-325 MG tablet Take 1 tablet by mouth every 4 (four) hours as needed for moderate pain (pain score 4-6) or severe pain (pain score 7-10). 130 tablet 0   [START ON 05/21/2024] HYDROcodone -acetaminophen  (NORCO/VICODIN) 5-325 MG tablet Take 1 tablet by mouth every 4 (four) hours  as needed for moderate pain (pain score 4-6) or severe pain (pain score 7-10). 130 tablet 0   Multiple Vitamins-Minerals (CENTRUM SILVER ULTRA WOMENS) TABS Take 1 tablet by mouth daily.     nitroGLYCERIN  (NITROSTAT ) 0.4 MG SL tablet Place 1 tablet (0.4 mg total) under the tongue every 5 (five) minutes as needed for chest pain. 25 tablet 3   ondansetron  (ZOFRAN -ODT) 4 MG disintegrating tablet Take 1 tablet (4 mg total) by mouth every 6 (six) hours as needed for nausea or vomiting. 20 tablet 2   PROAIR  RESPICLICK 108 (90 Base) MCG/ACT AEPB INHALE 1 PUFF BY MOUTH EVERY 6 HOURS AS NEEDED FOR COUGH 1 each 0   Tiotropium Bromide  Monohydrate (SPIRIVA  RESPIMAT) 2.5 MCG/ACT AERS Inhale 2 puffs into the lungs daily. 60 each 12   isosorbide  mononitrate (IMDUR ) 30 MG 24 hr tablet Take 1 tablet (30 mg total) by mouth daily. 90 tablet 3   No current facility-administered medications on file prior to visit.    Review of Systems  Constitutional:  Negative for chills and fever.  Respiratory:  Positive for shortness of breath (chronic). Negative for cough.   Cardiovascular:  Negative for chest pain and palpitations.  Gastrointestinal:  Negative for nausea and vomiting.  Musculoskeletal:  Positive for arthralgias and back pain.      Objective:    BP 130/70   Pulse 94   Temp 98.3 F (36.8 C) (Oral)   Ht 5\' 2"  (1.575 m)   Wt 149 lb (67.6 kg)   SpO2 95%   BMI 27.25 kg/m  BP Readings from Last 3 Encounters:  04/29/24 130/70  02/11/24 125/72  01/03/24 122/78   Wt Readings from Last 3 Encounters:  04/29/24 149 lb (67.6 kg)  02/11/24 157 lb (71.2 kg)  01/03/24 151 lb 9.6 oz (68.8 kg)    Physical Exam Vitals reviewed.  Constitutional:      Appearance: She is well-developed.  Eyes:     Conjunctiva/sclera: Conjunctivae normal.  Cardiovascular:     Rate and Rhythm: Normal rate and regular rhythm.     Pulses: Normal pulses.     Heart sounds: Normal heart sounds.  Pulmonary:     Effort: Pulmonary  effort is normal.     Breath sounds: Normal breath sounds. No wheezing, rhonchi or rales.  Musculoskeletal:     Right hand: No swelling or bony tenderness. Normal range of motion. Normal pulse.     Left hand: No bony tenderness. Normal range of motion. Normal pulse.     Comments: Left dorsal aspect of hand 2 cm soft fluid-filled slightly tender cyst .  Nonfluctuant.  Skin is intact.  No increased warmth, erythema, red streaks  Skin:    General: Skin is warm and dry.  Neurological:  Mental Status: She is alert.  Psychiatric:        Speech: Speech normal.        Behavior: Behavior normal.        Thought Content: Thought content normal.

## 2024-04-29 NOTE — Patient Instructions (Signed)
 Will obtain x-ray of your left hand to evaluate cyst.  I have also placed a referral to orthopedic surgeon to get the patient for aspiration, excision if needed.

## 2024-04-30 ENCOUNTER — Ambulatory Visit (INDEPENDENT_AMBULATORY_CARE_PROVIDER_SITE_OTHER)

## 2024-04-30 ENCOUNTER — Telehealth: Payer: Self-pay | Admitting: Family

## 2024-04-30 DIAGNOSIS — R809 Proteinuria, unspecified: Secondary | ICD-10-CM

## 2024-04-30 LAB — MICROALBUMIN / CREATININE URINE RATIO
Creatinine,U: 170.6 mg/dL
Microalb Creat Ratio: 222.4 mg/g — ABNORMAL HIGH (ref 0.0–30.0)
Microalb, Ur: 38 mg/dL — ABNORMAL HIGH (ref 0.0–1.9)

## 2024-04-30 NOTE — Telephone Encounter (Signed)
 Can urine protein be added to urine collected this week?

## 2024-05-01 ENCOUNTER — Encounter: Payer: Self-pay | Admitting: Family

## 2024-05-02 NOTE — Assessment & Plan Note (Signed)
 Etiology of symptoms is nonspecific at this time.  No known injury.  Acute nature would suggest hematoma.  Alternatively consider ganglion.  Referral orthopedic.  Continue icing

## 2024-05-02 NOTE — Assessment & Plan Note (Signed)
 Chronic, stable. Continue cardizem  180mg , lisinopril  20mg  and Imdur  30mg  ( started by cardiology 11/2023)

## 2024-05-02 NOTE — Assessment & Plan Note (Signed)
 Chronic, stable. Continue Lexapro  20 mg qd and xanax  0.25mg  BID.

## 2024-05-03 DIAGNOSIS — I671 Cerebral aneurysm, nonruptured: Secondary | ICD-10-CM | POA: Diagnosis not present

## 2024-05-03 DIAGNOSIS — G4733 Obstructive sleep apnea (adult) (pediatric): Secondary | ICD-10-CM | POA: Diagnosis not present

## 2024-05-03 DIAGNOSIS — J449 Chronic obstructive pulmonary disease, unspecified: Secondary | ICD-10-CM | POA: Diagnosis not present

## 2024-05-05 ENCOUNTER — Ambulatory Visit
Admission: RE | Admit: 2024-05-05 | Discharge: 2024-05-05 | Disposition: A | Discharge status: Home / Self Care | Source: Ambulatory Visit | Attending: Family | Admitting: Family

## 2024-05-05 DIAGNOSIS — Z122 Encounter for screening for malignant neoplasm of respiratory organs: Secondary | ICD-10-CM | POA: Insufficient documentation

## 2024-05-05 DIAGNOSIS — F17210 Nicotine dependence, cigarettes, uncomplicated: Secondary | ICD-10-CM | POA: Insufficient documentation

## 2024-05-05 DIAGNOSIS — Z87891 Personal history of nicotine dependence: Secondary | ICD-10-CM | POA: Diagnosis not present

## 2024-05-06 ENCOUNTER — Ambulatory Visit: Payer: Self-pay

## 2024-05-06 ENCOUNTER — Other Ambulatory Visit: Payer: Self-pay | Admitting: Family

## 2024-05-06 ENCOUNTER — Ambulatory Visit

## 2024-05-06 DIAGNOSIS — I700 Atherosclerosis of aorta: Secondary | ICD-10-CM

## 2024-05-06 DIAGNOSIS — I10 Essential (primary) hypertension: Secondary | ICD-10-CM

## 2024-05-06 MED ORDER — LISINOPRIL 40 MG PO TABS: 40.0000 mg | ORAL_TABLET | Freq: Every day | ORAL | 4 refills | Status: AC

## 2024-05-06 MED ORDER — ROSUVASTATIN CALCIUM 40 MG PO TABS: 40.0000 mg | ORAL_TABLET | Freq: Every day | ORAL | 4 refills | Status: AC

## 2024-05-06 NOTE — Progress Notes (Signed)
 The 10-year ASCVD risk score (Arnett DK, et al., 2019) is: 13.6%

## 2024-05-12 ENCOUNTER — Ambulatory Visit: Admitting: Student in an Organized Health Care Education/Training Program

## 2024-05-14 ENCOUNTER — Other Ambulatory Visit

## 2024-05-15 ENCOUNTER — Telehealth: Payer: Self-pay | Admitting: Family

## 2024-05-15 ENCOUNTER — Other Ambulatory Visit

## 2024-05-15 ENCOUNTER — Other Ambulatory Visit (INDEPENDENT_AMBULATORY_CARE_PROVIDER_SITE_OTHER)

## 2024-05-15 DIAGNOSIS — I700 Atherosclerosis of aorta: Secondary | ICD-10-CM

## 2024-05-15 NOTE — Telephone Encounter (Signed)
 Pt has left Chronic special needs forms to be filled out by Bascom Bossier, pt would prefer they're mailed back to her if able. They'll be in Margaret's color folder up front. [facility]

## 2024-05-16 ENCOUNTER — Ambulatory Visit: Payer: Self-pay | Admitting: Family

## 2024-05-16 LAB — COMPREHENSIVE METABOLIC PANEL WITH GFR
ALT: 11 U/L (ref 0–35)
AST: 20 U/L (ref 0–37)
Albumin: 4.4 g/dL (ref 3.5–5.2)
Alkaline Phosphatase: 69 U/L (ref 39–117)
BUN: 13 mg/dL (ref 6–23)
CO2: 37 meq/L — ABNORMAL HIGH (ref 19–32)
Calcium: 9.7 mg/dL (ref 8.4–10.5)
Chloride: 98 meq/L (ref 96–112)
Creatinine, Ser: 0.8 mg/dL (ref 0.40–1.20)
GFR: 77.72 mL/min (ref >60.00)
Glucose, Bld: 90 mg/dL (ref 70–99)
Potassium: 4 meq/L (ref 3.5–5.1)
Sodium: 143 meq/L (ref 135–145)
Total Bilirubin: 0.3 mg/dL (ref 0.2–1.2)
Total Protein: 7.3 g/dL (ref 6.0–8.3)

## 2024-05-16 NOTE — Telephone Encounter (Signed)
 Patient paperwork is on the red folder face down on [employer].

## 2024-05-23 NOTE — Telephone Encounter (Signed)
Paper work faxed and received.

## 2024-05-23 NOTE — Telephone Encounter (Signed)
 Completed and given to daijah

## 2024-05-28 ENCOUNTER — Ambulatory Visit: Payer: Self-pay | Admitting: Medical

## 2024-05-28 ENCOUNTER — Other Ambulatory Visit: Payer: Self-pay

## 2024-05-28 DIAGNOSIS — Z122 Encounter for screening for malignant neoplasm of respiratory organs: Secondary | ICD-10-CM

## 2024-05-28 DIAGNOSIS — Z87891 Personal history of nicotine dependence: Secondary | ICD-10-CM

## 2024-05-28 DIAGNOSIS — F17210 Nicotine dependence, cigarettes, uncomplicated: Secondary | ICD-10-CM

## 2024-05-29 ENCOUNTER — Ambulatory Visit: Payer: PPO | Admitting: Student in an Organized Health Care Education/Training Program

## 2024-05-29 DIAGNOSIS — G35 Multiple sclerosis: Secondary | ICD-10-CM | POA: Diagnosis not present

## 2024-05-29 DIAGNOSIS — M65832 Other synovitis and tenosynovitis, left forearm: Secondary | ICD-10-CM | POA: Diagnosis not present

## 2024-05-29 DIAGNOSIS — M797 Fibromyalgia: Secondary | ICD-10-CM | POA: Diagnosis not present

## 2024-05-29 DIAGNOSIS — M71332 Other bursal cyst, left wrist: Secondary | ICD-10-CM | POA: Diagnosis not present

## 2024-05-29 DIAGNOSIS — J439 Emphysema, unspecified: Secondary | ICD-10-CM | POA: Diagnosis not present

## 2024-05-30 ENCOUNTER — Telehealth: Payer: Self-pay | Admitting: Student in an Organized Health Care Education/Training Program

## 2024-05-30 NOTE — Telephone Encounter (Signed)
 Patient needs PFT scheduled prior to 06/05/2024.

## 2024-06-02 ENCOUNTER — Ambulatory Visit

## 2024-06-02 ENCOUNTER — Ambulatory Visit: Attending: Anesthesiology | Admitting: Anesthesiology

## 2024-06-02 ENCOUNTER — Encounter: Payer: Self-pay | Admitting: Anesthesiology

## 2024-06-02 DIAGNOSIS — M47812 Spondylosis without myelopathy or radiculopathy, cervical region: Secondary | ICD-10-CM

## 2024-06-02 DIAGNOSIS — M5136 Other intervertebral disc degeneration, lumbar region with discogenic back pain only: Secondary | ICD-10-CM

## 2024-06-02 DIAGNOSIS — M47816 Spondylosis without myelopathy or radiculopathy, lumbar region: Secondary | ICD-10-CM

## 2024-06-02 DIAGNOSIS — M5441 Lumbago with sciatica, right side: Secondary | ICD-10-CM

## 2024-06-02 DIAGNOSIS — M19012 Primary osteoarthritis, left shoulder: Secondary | ICD-10-CM | POA: Diagnosis not present

## 2024-06-02 DIAGNOSIS — M5442 Lumbago with sciatica, left side: Secondary | ICD-10-CM

## 2024-06-02 DIAGNOSIS — G894 Chronic pain syndrome: Secondary | ICD-10-CM

## 2024-06-02 DIAGNOSIS — F1190 Opioid use, unspecified, uncomplicated: Secondary | ICD-10-CM

## 2024-06-02 DIAGNOSIS — G8929 Other chronic pain: Secondary | ICD-10-CM

## 2024-06-02 MED ORDER — HYDROCODONE-ACETAMINOPHEN 5-325 MG PO TABS: 1.0000 | ORAL_TABLET | ORAL | 0 refills | Status: AC | PRN

## 2024-06-02 MED ORDER — HYDROCODONE-ACETAMINOPHEN 5-325 MG PO TABS: 1.0000 | ORAL_TABLET | ORAL | 0 refills | Status: DC | PRN

## 2024-06-02 MED ORDER — TIZANIDINE HCL 4 MG PO TABS: 4.0000 mg | ORAL_TABLET | Freq: Three times a day (TID) | ORAL | 3 refills | Status: AC

## 2024-06-02 NOTE — Progress Notes (Signed)
 Virtual Visit via Telephone Note  I connected with Debra Hogan on 06/02/24 at  2:20 PM EDT by telephone and verified that I am speaking with the correct person using two identifiers.  Location: Patient: Home Provider: Pain control center   I discussed the limitations, risks, security and privacy concerns of performing an evaluation and management service by telephone and the availability of in person appointments. I also discussed with the patient that there Denson be a patient responsible charge related to this service. The patient expressed understanding and agreed to proceed.   History of Present Illness: I spoke with Debra Hogan via telephone as we were not able to link for the video portion of the conference.  She reports that her low back pain and diffuse body pain are stable with no recent changes.  She has had some synovitis in her wrist followed by orthopedics and they have placed her on oral prednisone  for a tapering dose.  This has also improved the synovitis and her COPD symptoms additionally.  Furthermore she reports that some of her MS pain is better as well.  She still taking her hydrocodone  as prescribed averaging about every 4 hour for administration.  She reports that the 5 mg strength gives her about 75% relief lasting about 4 hours before she has recurrence of her same baseline pain.  Without the medication she feels miserable and is more or less bedridden.  She has failed more conservative therapy.  Otherwise she is in her usual state of health.  Review of systems: General: No fevers or chills Pulmonary: No shortness of breath or dyspnea Cardiac: No angina or palpitations or lightheadedness GI: No abdominal pain or constipation Psych: No depression    Observations/Objective:  Current Outpatient Medications:    [START ON 07/19/2024] HYDROcodone -acetaminophen  (NORCO/VICODIN) 5-325 MG tablet, Take 1 tablet by mouth every 4 (four) hours as needed for moderate pain (pain score  4-6) or severe pain (pain score 7-10)., Disp: 130 tablet, Rfl: 0   tiZANidine  (ZANAFLEX ) 4 MG tablet, Take 1 tablet (4 mg total) by mouth 3 (three) times daily., Disp: 90 tablet, Rfl: 3   acyclovir  (ZOVIRAX ) 400 MG tablet, Take 1 tablet (400 mg total) by mouth daily., Disp: 90 tablet, Rfl: 3   ALPRAZolam  (XANAX ) 0.25 MG tablet, Take 1 tablet (0.25 mg total) by mouth 2 (two) times daily as needed. for anxiety, Disp: 60 tablet, Rfl: 2   aspirin  EC 81 MG tablet, Take 81 mg by mouth daily., Disp: , Rfl:    cholecalciferol (VITAMIN D ) 1000 units tablet, Take 1,000 Units by mouth daily., Disp: , Rfl:    cyclobenzaprine  (FLEXERIL ) 10 MG tablet, Take 1 tablet (10 mg total) by mouth at bedtime., Disp: 30 tablet, Rfl: 5   cycloSPORINE (RESTASIS) 0.05 % ophthalmic emulsion, 1 drop 2 (two) times daily., Disp: , Rfl:    diltiazem  (CARDIZEM  CD) 180 MG 24 hr capsule, TAKE 1 CAPSULE BY MOUTH ONCE DAILY, Disp: 90 capsule, Rfl: 3   escitalopram  (LEXAPRO ) 20 MG tablet, Take 1 tablet (20 mg total) by mouth daily., Disp: 90 tablet, Rfl: 3   gabapentin  (NEURONTIN ) 100 MG capsule, Take 1 capsule (100 mg total) by mouth 3 (three) times daily., Disp: 270 capsule, Rfl: 3   [START ON 06/20/2024] HYDROcodone -acetaminophen  (NORCO/VICODIN) 5-325 MG tablet, Take 1 tablet by mouth every 4 (four) hours as needed for moderate pain (pain score 4-6) or severe pain (pain score 7-10)., Disp: 130 tablet, Rfl: 0   isosorbide  mononitrate (IMDUR ) 30  MG 24 hr tablet, Take 1 tablet (30 mg total) by mouth daily., Disp: 90 tablet, Rfl: 3   lisinopril  (ZESTRIL ) 40 MG tablet, Take 1 tablet (40 mg total) by mouth daily., Disp: 90 tablet, Rfl: 4   Multiple Vitamins-Minerals (CENTRUM SILVER ULTRA WOMENS) TABS, Take 1 tablet by mouth daily., Disp: , Rfl:    nitroGLYCERIN  (NITROSTAT ) 0.4 MG SL tablet, Place 1 tablet (0.4 mg total) under the tongue every 5 (five) minutes as needed for chest pain., Disp: 25 tablet, Rfl: 3   ondansetron  (ZOFRAN -ODT) 4 MG  disintegrating tablet, Take 1 tablet (4 mg total) by mouth every 6 (six) hours as needed for nausea or vomiting., Disp: 20 tablet, Rfl: 2   PROAIR  RESPICLICK 108 (90 Base) MCG/ACT AEPB, INHALE 1 PUFF BY MOUTH EVERY 6 HOURS AS NEEDED FOR COUGH, Disp: 1 each, Rfl: 0   rosuvastatin  (CRESTOR ) 40 MG tablet, Take 1 tablet (40 mg total) by mouth daily., Disp: 90 tablet, Rfl: 4   Tiotropium Bromide  Monohydrate (SPIRIVA  RESPIMAT) 2.5 MCG/ACT AERS, Inhale 2 puffs into the lungs daily., Disp: 60 each, Rfl: 12   MRI examination of the lumbar spine Assessment and Plan:  1. Chronic bilateral low back pain with bilateral sciatica   2. Chronic pain syndrome   3. Chronic, continuous use of opioids   4. Lumbar facet joint syndrome   5. Primary osteoarthritis of left shoulder   6. Facet arthropathy, cervical   7. Degeneration of intervertebral disc of lumbar region with discogenic back pain    Based on our conversation and after review of the [place]  practitioner database information agrees appropriate to refill her medicines.  She is doing well and deriving good functional pain relief and improving her daily activity and lifestyle benefit.  Will schedule her refills for June 27 and July 26 secondary to the weekend.  Continue with her current stretching modality and exercise as tolerated.  Continue follow-up with orthopedics for the synovitis and with her primary care physician for baseline medical care.  Will schedule her for return to clinic in 2 months. Follow Up Instructions:    I discussed the assessment and treatment plan with the patient. The patient was provided an opportunity to ask questions and all were answered. The patient agreed with the plan and demonstrated an understanding of the instructions.   The patient was advised to call back or seek an in-person evaluation if the symptoms worsen or if the condition fails to improve as anticipated.  I provided 30 minutes of non-face-to-face time  during this encounter.   Zula Hitch, MD

## 2024-06-03 ENCOUNTER — Telehealth: Admitting: Anesthesiology

## 2024-06-03 DIAGNOSIS — I671 Cerebral aneurysm, nonruptured: Secondary | ICD-10-CM | POA: Diagnosis not present

## 2024-06-03 DIAGNOSIS — J449 Chronic obstructive pulmonary disease, unspecified: Secondary | ICD-10-CM | POA: Diagnosis not present

## 2024-06-03 DIAGNOSIS — G4733 Obstructive sleep apnea (adult) (pediatric): Secondary | ICD-10-CM | POA: Diagnosis not present

## 2024-06-05 ENCOUNTER — Ambulatory Visit: Admitting: Student in an Organized Health Care Education/Training Program

## 2024-06-11 ENCOUNTER — Ambulatory Visit: Payer: Self-pay | Admitting: Medical

## 2024-06-11 ENCOUNTER — Encounter: Payer: Self-pay | Admitting: Family

## 2024-06-11 DIAGNOSIS — R899 Unspecified abnormal finding in specimens from other organs, systems and tissues: Secondary | ICD-10-CM

## 2024-06-12 ENCOUNTER — Ambulatory Visit: Attending: Emergency Medicine

## 2024-06-12 DIAGNOSIS — I3139 Other pericardial effusion (noninflammatory): Secondary | ICD-10-CM

## 2024-06-12 LAB — ECHOCARDIOGRAM LIMITED: S' Lateral: 2.6 cm

## 2024-06-16 ENCOUNTER — Ambulatory Visit: Payer: Self-pay | Admitting: Emergency Medicine

## 2024-06-18 ENCOUNTER — Ambulatory Visit: Payer: Self-pay | Admitting: Medical

## 2024-06-18 NOTE — Progress Notes (Deleted)
  Cardiology Office Note   Date:  06/18/2024  ID:  Debra Hogan, DOB 1961/11/10, MRN [id number] PCP: Dineen Rollene MATSU, FNP  [facility] Providers Cardiologist:  Deatrice Cage, MD { Click to update primary MD,subspecialty MD or APP then REFRESH:1}    History of Present Illness Debra Hogan is a 63 y.o. female with a h/o pulmonary HTN, fibromyalgia, multiple sclerosis, toabcco use, COPD, degenerative disc disease with chronic back pain, HLD, fmaily h/o CAD, tobacco use who presents for follow-up.   Lexiscan  Myoview  2019 showed no evidence of ischemia. She underwent left heart cath in October 2020 which showed mild pRCA stenosis with catheter induced spasm that improved with SL NTG, no other obstructive disease. RHC showed mild pulmonary HTN with moderately elevated RA pressure. She was placed on diltiazem  for BP control and presumed spasm with improvement of symptoms.   Seen 04/2023 for worsening chest pain and dyspnea.  Lexiscan  Myoview  showed no evidence of significant ischemia or scar, and was overall low risk.  Echo showed EF 65 to 70%, no wall motion abnormality, grade 1 diastolic dysfunction, normal RV SF, small pericardial effusion, trivial MR.  Patient was last seen 12/10/2023 was stable from a cardiac perspective.  Repeat echo was ordered to eval for pericardial fusion.  Echo showed normal pulm function small effusion circumferential.  Today,  ROS: ***  Studies Reviewed      *** Risk Assessment/Calculations {Does this patient have ATRIAL FIBRILLATION?:[phone]} No BP recorded.  {Refresh Note OR Click here to enter BP  :1}***       Physical Exam VS:  There were no vitals taken for this visit.       Wt Readings from Last 3 Encounters:  04/29/24 149 lb (67.6 kg)  02/11/24 157 lb (71.2 kg)  01/03/24 151 lb 9.6 oz (68.8 kg)    GEN: Well nourished, well developed in no acute distress NECK: No JVD; No carotid bruits CARDIAC: ***RRR, no murmurs, rubs,  gallops RESPIRATORY:  Clear to auscultation without rales, wheezing or rhonchi  ABDOMEN: Soft, non-tender, non-distended EXTREMITIES:  No edema; No deformity   ASSESSMENT AND PLAN ***    {Are you ordering a CV Procedure (e.g. stress test, cath, DCCV, TEE, etc)?   Press F2        :[id number]}  Dispo: ***  Signed, Gor Vestal VEAR Fishman, PA-C

## 2024-07-03 DIAGNOSIS — I671 Cerebral aneurysm, nonruptured: Secondary | ICD-10-CM | POA: Diagnosis not present

## 2024-07-03 DIAGNOSIS — J449 Chronic obstructive pulmonary disease, unspecified: Secondary | ICD-10-CM | POA: Diagnosis not present

## 2024-07-03 DIAGNOSIS — G4733 Obstructive sleep apnea (adult) (pediatric): Secondary | ICD-10-CM | POA: Diagnosis not present

## 2024-07-21 ENCOUNTER — Ambulatory Visit: Admitting: Family

## 2024-07-22 DIAGNOSIS — H25043 Posterior subcapsular polar age-related cataract, bilateral: Secondary | ICD-10-CM | POA: Diagnosis not present

## 2024-07-22 DIAGNOSIS — H04123 Dry eye syndrome of bilateral lacrimal glands: Secondary | ICD-10-CM | POA: Diagnosis not present

## 2024-07-22 DIAGNOSIS — H35373 Puckering of macula, bilateral: Secondary | ICD-10-CM | POA: Diagnosis not present

## 2024-07-22 DIAGNOSIS — H2513 Age-related nuclear cataract, bilateral: Secondary | ICD-10-CM | POA: Diagnosis not present

## 2024-07-23 ENCOUNTER — Ambulatory Visit (INDEPENDENT_AMBULATORY_CARE_PROVIDER_SITE_OTHER): Admitting: *Deleted

## 2024-07-23 VITALS — Ht 64.0 in | Wt 165.0 lb

## 2024-07-23 DIAGNOSIS — Z0000 Encounter for general adult medical examination without abnormal findings: Secondary | ICD-10-CM | POA: Diagnosis not present

## 2024-07-23 NOTE — Patient Instructions (Signed)
 Ms. Saephan , Thank you for taking time out of your busy schedule to complete your Annual Wellness Visit with me. I enjoyed our conversation and look forward to speaking with you again next year. I, as well as your care team,  appreciate your ongoing commitment to your health goals. Please review the following plan we discussed and let me know if I can assist you in the future. Your Game plan/ To Do List    Referrals: If you haven't heard from the office you've been referred to, please reach out to them at the phone provided.  Remember to update your pneumonia and shingles vaccines.  Follow up Visits: Next Medicare AWV with our clinical staff: 07/27/25 @ 3:20   Have you seen your provider in the last 6 months (3 months if uncontrolled diabetes)? Yes Next Office Visit with your provider: 07/31/24  Clinician Recommendations:  Aim for 30 minutes of exercise or brisk walking, 6-8 glasses of water, and 5 servings of fruits and vegetables each day.       This is a list of the screening recommended for you and due dates:  Health Maintenance  Topic Date Due   Zoster (Shingles) Vaccine (1 of 2) Never done   Pneumococcal Vaccination (2 of 2 - PCV) 05/14/2019   Colon Cancer Screening  09/24/2021   Flu Shot  07/25/2024   Screening for Lung Cancer  05/05/2025   Medicare Annual Wellness Visit  07/23/2025   Pap with HPV screening  04/06/2027   DTaP/Tdap/Td vaccine (3 - Td or Tdap) 05/13/2028   Hepatitis C Screening  Completed   HIV Screening  Completed   Hepatitis B Vaccine  Aged Out   HPV Vaccine  Aged Out   Meningitis B Vaccine  Aged Out   Mammogram  Discontinued   COVID-19 Vaccine  Discontinued    Advanced directives: (Declined) Advance directive discussed with you today. Even though you declined this today, please call our office should you change your mind, and we can give you the proper paperwork for you to fill out. Poythress get paperwork at next office visitManaging Pain Without Opioids Opioids are  strong medicines used to treat moderate to severe pain. For some people, especially those who have long-term (chronic) pain, opioids Smithey not be the best choice for pain management due to: Side effects like nausea, constipation, and sleepiness. The risk of addiction (opioid use disorder). The longer you take opioids, the greater your risk of addiction. Pain that lasts for more than 3 months is called chronic pain. Managing chronic pain usually requires more than one approach and is often provided by a team of health care providers working together (multidisciplinary approach). Pain management Standre be done at a pain management center or pain clinic. How to manage pain without the use of opioids Use non-opioid medicines Non-opioid medicines for pain Blanchett include: Over-the-counter or prescription non-steroidal anti-inflammatory drugs (NSAIDs). These Hammond be the first medicines used for pain. They work well for muscle and bone pain, and they reduce swelling. Acetaminophen . This over-the-counter medicine Balthazar work well for milder pain but not swelling. Antidepressants. These Toscano be used to treat chronic pain. A certain type of antidepressant (tricyclics) is often used. These medicines are given in lower doses for pain than when used for depression. Anticonvulsants. These are usually used to treat seizures but Woolum also reduce nerve (neuropathic) pain. Muscle relaxants. These relieve pain caused by sudden muscle tightening (spasms). You Harig also use a pain medicine that is applied to  the skin as a patch, cream, or gel (topical analgesic), such as a numbing medicine. These Peel cause fewer side effects than medicines taken by mouth. Do certain therapies as directed Some therapies can help with pain management. They include: Physical therapy. You will do exercises to gain strength and flexibility. A physical therapist Krock teach you exercises to move and stretch parts of your body that are weak, stiff, or painful.  You can learn these exercises at physical therapy visits and practice them at home. Physical therapy Ettinger also involve: Massage. Heat wraps or applying heat or cold to affected areas. Electrical signals that interrupt pain signals (transcutaneous electrical nerve stimulation, TENS). Weak lasers that reduce pain and swelling (low-level laser therapy). Signals from your body that help you learn to regulate pain (biofeedback). Occupational therapy. This helps you to learn ways to function at home and work with less pain. Recreational therapy. This involves trying new activities or hobbies, such as a physical activity or drawing. Mental health therapy, including: Cognitive behavioral therapy (CBT). This helps you learn coping skills for dealing with pain. Acceptance and commitment therapy (ACT) to change the way you think and react to pain. Relaxation therapies, including muscle relaxation exercises and mindfulness-based stress reduction. Pain management counseling. This Puopolo be individual, family, or group counseling.  Receive medical treatments Medical treatments for pain management include: Nerve block injections. These Campa include a pain blocker and anti-inflammatory medicines. You Baugh have injections: Near the spine to relieve chronic back or neck pain. Into joints to relieve back or joint pain. Into nerve areas that supply a painful area to relieve body pain. Into muscles (trigger point injections) to relieve some painful muscle conditions. A medical device placed near your spine to help block pain signals and relieve nerve pain or chronic back pain (spinal cord stimulation device). Acupuncture. Follow these instructions at home Medicines Take over-the-counter and prescription medicines only as told by your health care provider. If you are taking pain medicine, ask your health care providers about possible side effects to watch out for. Do not drive or use heavy machinery while taking  prescription opioid pain medicine. Lifestyle  Do not use drugs or alcohol to reduce pain. If you drink alcohol, limit how much you have to: 0-1 drink a day for women who are not pregnant. 0-2 drinks a day for men. Know how much alcohol is in a drink. In the [place] one drink equals one 12 oz bottle of beer (355 mL), one 5 oz glass of wine (148 mL), or one 1 oz glass of hard liquor (44 mL). Do not use any products that contain nicotine  or tobacco. These products include cigarettes, chewing tobacco, and vaping devices, such as e-cigarettes. If you need help quitting, ask your health care provider. Eat a healthy diet and maintain a healthy weight. Poor diet and excess weight Gravlin make pain worse. Eat foods that are high in fiber. These include fresh fruits and vegetables, whole grains, and beans. Limit foods that are high in fat and processed sugars, such as fried and sweet foods. Exercise regularly. Exercise lowers stress and Aguayo help relieve pain. Ask your health care provider what activities and exercises are safe for you. If your health care provider approves, join an exercise class that combines movement and stress reduction. Examples include yoga and tai chi. Get enough sleep. Lack of sleep Kolander make pain worse. Lower stress as much as possible. Practice stress reduction techniques as told by your therapist. General instructions Work  with all your pain management providers to find the treatments that work best for you. You are an important member of your pain management team. There are many things you can do to reduce pain on your own. Consider joining an online or in-person support group for people who have chronic pain. Keep all follow-up visits. This is important. Where to find more information You can find more information about managing pain without opioids from: American Academy of Pain Medicine: painmed.org Institute for Chronic Pain: instituteforchronicpain.org American Chronic Pain  Association: theacpa.org Contact a health care provider if: You have side effects from pain medicine. Your pain gets worse or does not get better with treatments or home therapy. You are struggling with anxiety or depression. Summary Many types of pain can be managed without opioids. Chronic pain Beeghly respond better to pain management without opioids. Pain is best managed when you and a team of health care providers work together. Pain management without opioids Stirn include non-opioid medicines, medical treatments, physical therapy, mental health therapy, and lifestyle changes. Tell your health care providers if your pain gets worse or is not being managed well enough. This information is not intended to replace advice given to you by your health care provider. Make sure you discuss any questions you have with your health care provider. Document Revised: 03/23/2021 Document Reviewed: 03/23/2021  Elsevier Patient Education  2024 Elsevier Inc. Advance Care Planning is important because it:  [x]  Makes sure you receive the medical care that is consistent with your values, goals, and preferences  [x]  It provides guidance to your family and loved ones and reduces their decisional burden about whether or not they are making the right decisions based on your wishes.

## 2024-07-23 NOTE — Progress Notes (Signed)
 Subjective:   Debra Hogan is a 63 y.o. who presents for a Medicare Wellness preventive visit.  As a reminder, Annual Wellness Visits don't include a physical exam, and some assessments Gaona be limited, especially if this visit is performed virtually. We Frisch recommend an in-person follow-up visit with your provider if needed.  Visit Complete: Virtual I connected with  Debra Hogan on 07/23/24 by a audio enabled telemedicine application and verified that I am speaking with the correct person using two identifiers.  Patient Location: Home  Provider Location: Home Office  I discussed the limitations of evaluation and management by telemedicine. The patient expressed understanding and agreed to proceed.  Vital Signs: Because this visit was a virtual/telehealth visit, some criteria Bitner be missing or patient reported. Any vitals not documented were not able to be obtained and vitals that have been documented are patient reported.  VideoDeclined- This patient declined Librarian, academic. Therefore the visit was completed with audio only.  Persons Participating in Visit: Patient.  AWV Questionnaire: Yes: Patient Medicare AWV questionnaire was completed by the patient on 07/23/24; I have confirmed that all information answered by patient is correct and no changes since this date.  Cardiac Risk Factors include: advanced age (>62men, >47 women);dyslipidemia;hypertension;smoking/ tobacco exposure     Objective:    Today's Vitals   07/23/24 1259 07/23/24 1300  Weight: 165 lb (74.8 kg)   Height: 5' 4 (1.626 m)   PainSc:  6    Body mass index is 28.32 kg/m.     07/23/2024    1:20 PM 05/02/2023    3:00 PM 05/29/2022    2:25 PM 03/15/2022    3:03 PM 05/24/2021    2:58 PM 01/16/2021   11:50 AM 04/27/2020    1:41 PM  Advanced Directives  Does Patient Have a Medical Advance Directive? No No No No No No No  Would patient like information on creating a medical advance  directive? No - Patient declined No - Patient declined No - Patient declined  No - Patient declined No - Patient declined     Current Medications (verified) Outpatient Encounter Medications as of 07/23/2024  Medication Sig   acyclovir  (ZOVIRAX ) 400 MG tablet Take 1 tablet (400 mg total) by mouth daily.   ALPRAZolam  (XANAX ) 0.25 MG tablet Take 1 tablet (0.25 mg total) by mouth 2 (two) times daily as needed. for anxiety   aspirin  EC 81 MG tablet Take 81 mg by mouth daily.   cholecalciferol (VITAMIN D ) 1000 units tablet Take 1,000 Units by mouth daily.   cyclobenzaprine  (FLEXERIL ) 10 MG tablet Take 1 tablet (10 mg total) by mouth at bedtime.   cycloSPORINE (RESTASIS) 0.05 % ophthalmic emulsion 1 drop 2 (two) times daily.   diltiazem  (CARDIZEM  CD) 180 MG 24 hr capsule TAKE 1 CAPSULE BY MOUTH ONCE DAILY   escitalopram  (LEXAPRO ) 20 MG tablet Take 1 tablet (20 mg total) by mouth daily.   gabapentin  (NEURONTIN ) 100 MG capsule Take 1 capsule (100 mg total) by mouth 3 (three) times daily.   HYDROcodone -acetaminophen  (NORCO/VICODIN) 5-325 MG tablet Take 1 tablet by mouth every 4 (four) hours as needed for moderate pain (pain score 4-6) or severe pain (pain score 7-10).   isosorbide  mononitrate (IMDUR ) 30 MG 24 hr tablet Take 1 tablet (30 mg total) by mouth daily.   lisinopril  (ZESTRIL ) 40 MG tablet Take 1 tablet (40 mg total) by mouth daily.   Multiple Vitamins-Minerals (CENTRUM SILVER ULTRA WOMENS) TABS Take  1 tablet by mouth daily.   nitroGLYCERIN  (NITROSTAT ) 0.4 MG SL tablet Place 1 tablet (0.4 mg total) under the tongue every 5 (five) minutes as needed for chest pain.   ondansetron  (ZOFRAN -ODT) 4 MG disintegrating tablet Take 1 tablet (4 mg total) by mouth every 6 (six) hours as needed for nausea or vomiting.   PROAIR  RESPICLICK 108 (90 Base) MCG/ACT AEPB INHALE 1 PUFF BY MOUTH EVERY 6 HOURS AS NEEDED FOR COUGH   rosuvastatin  (CRESTOR ) 40 MG tablet Take 1 tablet (40 mg total) by mouth daily.    Tiotropium Bromide  Monohydrate (SPIRIVA  RESPIMAT) 2.5 MCG/ACT AERS Inhale 2 puffs into the lungs daily.   tiZANidine  (ZANAFLEX ) 4 MG tablet Take 1 tablet (4 mg total) by mouth 3 (three) times daily. (Patient not taking: Reported on 07/23/2024)   No facility-administered encounter medications on file as of 07/23/2024.    Allergies (verified) Valium [diazepam]   History: Past Medical History:  Diagnosis Date   Anginal pain (HCC)    Arthritis    COPD (chronic obstructive pulmonary disease) (HCC)    Depression    Dyspnea    Fibromyalgia    Headache    Heat stroke 2006   Irritable bowel syndrome    MS (multiple sclerosis) (HCC)    Followed by Dr. Maree   Osteoarthritis of left shoulder 01/12/2022   Past Surgical History:  Procedure Laterality Date   ABDOMINAL HYSTERECTOMY     CERVIX intact; had for noncancerous reasons, endometriosis   CHOLECYSTECTOMY N/A 01/03/2016   Procedure: LAPAROSCOPIC CHOLECYSTECTOMY WITH INTRAOPERATIVE CHOLANGIOGRAM;  Surgeon: Charlie FORBES Fell, MD;  Location: [facility] ORS;  Service: General;  Laterality: N/A;   ENDOSCOPIC RETROGRADE CHOLANGIOPANCREATOGRAPHY (ERCP) WITH PROPOFOL  N/A 01/04/2016   Procedure: ENDOSCOPIC RETROGRADE CHOLANGIOPANCREATOGRAPHY (ERCP) WITH PROPOFOL ;  Surgeon: Rogelia Copping, MD;  Location: [facility] ENDOS[facility];  Service: Endoscopy;  Laterality: N/A;   FOOT SURGERY  2001   LAPAROSCOPIC ENDOMETRIOSIS FULGURATION     RIGHT/LEFT HEART CATH AND CORONARY ANGIOGRAPHY Bilateral 09/29/2019   Procedure: RIGHT/LEFT HEART CATH AND CORONARY ANGIOGRAPHY;  Surgeon: Mady Bruckner, MD;  Location: [facility];  Service: Cardiovascular;  Laterality: Bilateral;   Family History  Problem Relation Age of Onset   Hypertension Mother    Hyperlipidemia Mother    Arthritis Mother    Cancer Father        throat   Heart disease Brother    Bipolar disorder Son    Schizophrenia Son    Cancer Maternal Grandfather        liver   Cancer Paternal Grandmother         liver   Social History   Socioeconomic History   Marital status: Married    Spouse name: Not on file   Number of children: Not on file   Years of education: Not on file   Highest education level: GED or equivalent  Occupational History   Not on file  Tobacco Use   Smoking status: Every Day    Current packs/day: 1.00    Average packs/day: 1 pack/day for 52.6 years (52.6 ttl pk-yrs)    Types: Cigarettes    Start date: 1973   Smokeless tobacco: Never  Vaping Use   Vaping status: Never Used  Substance and Sexual Activity   Alcohol use: No    Alcohol/week: 0.0 standard drinks of alcohol   Drug use: No   Sexual activity: Not Currently    Birth control/protection: Post-menopausal  Other Topics Concern   Not on file  Social  History Narrative   Lives in [place]. Widowed x 2.      Velma Son died 12-24-2022 at 70 years of age.       Married for 14  years.       On disability      Social Drivers of Corporate investment banker Strain: Medium Risk (07/23/2024)   Overall Financial Resource Strain (CARDIA)    Difficulty of Paying Living Expenses: Somewhat hard  Food Insecurity: No Food Insecurity (07/23/2024)   Hunger Vital Sign    Worried About Running Out of Food in the Last Year: Never true    Ran Out of Food in the Last Year: Never true  Transportation Needs: No Transportation Needs (07/23/2024)   PRAPARE - Administrator, Civil Service (Medical): No    Lack of Transportation (Non-Medical): No  Physical Activity: Inactive (07/23/2024)   Exercise Vital Sign    Days of Exercise per Week: 0 days    Minutes of Exercise per Session: 0 min  Stress: Stress Concern Present (07/23/2024)   [employer] of Occupational Health - Occupational Stress Questionnaire    Feeling of Stress: To some extent  Social Connections: Socially Isolated (07/23/2024)   Social Connection and Isolation Panel    Frequency of Communication with Friends and Family: Once a week     Frequency of Social Gatherings with Friends and Family: Never    Attends Religious Services: Never    Diplomatic Services operational officer: No    Attends Engineer, structural: Not on file    Marital Status: Married    Tobacco Counseling Ready to quit: Not Answered Counseling given: Not Answered    Clinical Intake:  Pre-visit preparation completed: Yes  Pain : 0-10 Pain Score: 6  Pain Type: Chronic pain Pain Location: Back Pain Orientation: Lower Pain Descriptors / Indicators: Sharp, Aching Pain Onset: More than a month ago Pain Frequency: Intermittent     BMI - recorded: 28.32 Nutritional Status: BMI 25 -29 Overweight Nutritional Risks: None Diabetes: No  Lab Results  Component Value Date   HGBA1C 5.4 04/05/2022   HGBA1C 5.0 02/08/2021   HGBA1C 5.0 06/18/2020     How often do you need to have someone help you when you read instructions, pamphlets, or other written materials from your doctor or pharmacy?: 1 - Never  Interpreter Needed?: No  Information entered by :: R. Maven Varelas LPN   Activities of Daily Living     07/23/2024    1:04 PM  In your present state of health, do you have any difficulty performing the following activities:  Hearing? 0  Vision? 0  Difficulty concentrating or making decisions? 1  Walking or climbing stairs? 1  Dressing or bathing? 1  Doing errands, shopping? 1  Preparing Food and eating ? N  Using the Toilet? N  In the past six months, have you accidently leaked urine? N  Do you have problems with loss of bowel control? N  Managing your Medications? N  Managing your Finances? Y  Housekeeping or managing your Housekeeping? Y    Patient Care Team: Dineen Rollene MATSU, FNP as PCP - General (Family Medicine) Darron Deatrice LABOR, MD as PCP - Cardiology (Cardiology)  I have updated your Care Teams any recent Medical Services you Eddleman have received from other providers in the past year.     Assessment:   This is a routine  wellness examination for Debra Hogan.  Hearing/Vision screen Hearing Screening - Comments:: No  issues Vision Screening - Comments:: glasses   Goals Addressed             This Visit's Progress    Patient Stated       Would like to be more active       Depression Screen     07/23/2024    1:11 PM 04/29/2024   11:31 AM 02/11/2024    1:33 PM 01/03/2024    4:21 PM 05/02/2023    2:59 PM 01/12/2023    2:55 PM 05/29/2022    2:24 PM  PHQ 2/9 Scores  PHQ - 2 Score 1 0 0 0 0 0 0  PHQ- 9 Score 6      0    Fall Risk     07/23/2024    1:06 PM 04/29/2024   11:31 AM 02/11/2024    1:33 PM 01/03/2024    4:21 PM 05/02/2023    2:59 PM  Fall Risk   Falls in the past year? 1 0 1 0 0  Number falls in past yr: 0 0 0 0   Injury with Fall? 0 0 0 0   Risk for fall due to : History of fall(s);Impaired balance/gait No Fall Risks     Follow up Falls evaluation completed;Falls prevention discussed Falls evaluation completed  Falls evaluation completed     MEDICARE RISK AT HOME:  Medicare Risk at Home Any stairs in or around the home?: No If so, are there any without handrails?: No Home free of loose throw rugs in walkways, pet beds, electrical cords, etc?: Yes Adequate lighting in your home to reduce risk of falls?: Yes Life alert?: No Use of a cane, walker or w/c?: Yes Grab bars in the bathroom?: Yes Shower chair or bench in shower?: Yes Elevated toilet seat or a handicapped toilet?: No  TIMED UP AND GO:  Was the test performed?  No  Cognitive Function: 6CIT completed    03/25/2018    5:06 PM  MMSE - Mini Mental State Exam  Orientation to time 5  Orientation to Place 5  Registration 3  Attention/ Calculation 5  Recall 3  Language- name 2 objects 2  Language- repeat 1  Language- follow 3 step command 3  Language- read & follow direction 1  Write a sentence 1  Copy design 1  Total score 30        07/23/2024    1:21 PM 05/24/2021    3:03 PM 05/21/2019    3:59 PM  6CIT Screen  What Year? 0  points 0 points 0 points  What month? 0 points 0 points 0 points  What time? 0 points 0 points 0 points  Count back from 20 0 points 0 points 0 points  Months in reverse 0 points 0 points 0 points  Repeat phrase 0 points 0 points 0 points  Total Score 0 points 0 points 0 points    Immunizations Immunization History  Administered Date(s) Administered   Influenza Split 10/07/2012, 10/25/2014   Influenza Whole 10/09/2013   Influenza,inj,Quad PF,6+ Mos 10/21/2015, 01/02/2018, 10/21/2018   Pneumococcal Polysaccharide-23 01/04/2016, 05/13/2018   Tdap 10/21/2004, 05/13/2018    Screening Tests Health Maintenance  Topic Date Due   Zoster Vaccines- Shingrix (1 of 2) Never done   Pneumococcal Vaccine 72-42 Years old (2 of 2 - PCV) 05/14/2019   Colonoscopy  09/24/2021   Medicare Annual Wellness (AWV)  05/30/2023   INFLUENZA VACCINE  07/25/2024   Lung Cancer Screening  05/05/2025  Cervical Cancer Screening (HPV/Pap Cotest)  04/06/2027   DTaP/Tdap/Td (3 - Td or Tdap) 05/13/2028   Hepatitis C Screening  Completed   HIV Screening  Completed   Hepatitis B Vaccines  Aged Out   HPV VACCINES  Aged Out   Meningococcal B Vaccine  Aged Out   MAMMOGRAM  Discontinued   COVID-19 Vaccine  Discontinued    Health Maintenance  Health Maintenance Due  Topic Date Due   Zoster Vaccines- Shingrix (1 of 2) Never done   Pneumococcal Vaccine 56-19 Years old (2 of 2 - PCV) 05/14/2019   Colonoscopy  09/24/2021   Medicare Annual Wellness (AWV)  05/30/2023   Health Maintenance Items Addressed: Discussed the need to update pneumonia and shingles vaccines. Patient declines mammogram at this time. Patient plans on talking with PCP at upcoming office visit about a colonoscopy  .  Additional Screening:  Vision Screening: Recommended annual ophthalmology exams for early detection of glaucoma and other disorders of the eye.Up to date   [facility] Would you like a referral to an eye doctor? No    Dental  Screening: Recommended annual dental exams for proper oral hygiene  Community Resource Referral / Chronic Care Management: CRR required this visit?  No   CCM required this visit?  No   Plan:    I have personally reviewed and noted the following in the patient's chart:   Medical and social history Use of alcohol, tobacco or illicit drugs  Current medications and supplements including opioid prescriptions. Patient is currently taking opioid prescriptions. Information provided to patient regarding non-opioid alternatives. Patient advised to discuss non-opioid treatment plan with their provider. Functional ability and status Nutritional status Physical activity Advanced directives List of other physicians Hospitalizations, surgeries, and ER visits in previous 12 months Vitals Screenings to include cognitive, depression, and falls Referrals and appointments  In addition, I have reviewed and discussed with patient certain preventive protocols, quality metrics, and best practice recommendations. A written personalized care plan for preventive services as well as general preventive health recommendations were provided to patient.   Angeline Fredericks, LPN   [id number]/[id number]   After Visit Summary: (MyChart) Due to this being a telephonic visit, the after visit summary with patients personalized plan was offered to patient via MyChart   Notes: Nothing significant to report at this time.

## 2024-07-31 ENCOUNTER — Ambulatory Visit: Admitting: Family

## 2024-08-03 DIAGNOSIS — G4733 Obstructive sleep apnea (adult) (pediatric): Secondary | ICD-10-CM | POA: Diagnosis not present

## 2024-08-03 DIAGNOSIS — J449 Chronic obstructive pulmonary disease, unspecified: Secondary | ICD-10-CM | POA: Diagnosis not present

## 2024-08-03 DIAGNOSIS — I671 Cerebral aneurysm, nonruptured: Secondary | ICD-10-CM | POA: Diagnosis not present

## 2024-08-04 ENCOUNTER — Ambulatory Visit: Admitting: Student in an Organized Health Care Education/Training Program

## 2024-08-08 ENCOUNTER — Other Ambulatory Visit

## 2024-08-11 ENCOUNTER — Other Ambulatory Visit

## 2024-08-13 ENCOUNTER — Encounter: Payer: Self-pay | Admitting: Anesthesiology

## 2024-08-13 ENCOUNTER — Ambulatory Visit: Attending: Anesthesiology | Admitting: Anesthesiology

## 2024-08-13 DIAGNOSIS — M5136 Other intervertebral disc degeneration, lumbar region with discogenic back pain only: Secondary | ICD-10-CM

## 2024-08-13 DIAGNOSIS — M5030 Other cervical disc degeneration, unspecified cervical region: Secondary | ICD-10-CM

## 2024-08-13 DIAGNOSIS — M47812 Spondylosis without myelopathy or radiculopathy, cervical region: Secondary | ICD-10-CM

## 2024-08-13 DIAGNOSIS — M19012 Primary osteoarthritis, left shoulder: Secondary | ICD-10-CM

## 2024-08-13 DIAGNOSIS — M47816 Spondylosis without myelopathy or radiculopathy, lumbar region: Secondary | ICD-10-CM

## 2024-08-13 DIAGNOSIS — G8929 Other chronic pain: Secondary | ICD-10-CM

## 2024-08-13 DIAGNOSIS — F1190 Opioid use, unspecified, uncomplicated: Secondary | ICD-10-CM

## 2024-08-13 DIAGNOSIS — M5442 Lumbago with sciatica, left side: Secondary | ICD-10-CM

## 2024-08-13 DIAGNOSIS — G894 Chronic pain syndrome: Secondary | ICD-10-CM

## 2024-08-13 MED ORDER — HYDROCODONE-ACETAMINOPHEN 5-325 MG PO TABS: 1.0000 | ORAL_TABLET | ORAL | 0 refills | Status: DC | PRN

## 2024-08-13 NOTE — Progress Notes (Signed)
 I reached out to Debra Hogan today for her virtual conference but was unable to establish contact.  A prescription was sent in for her overdue medication with return request for appointment in 2 weeks.

## 2024-08-19 ENCOUNTER — Ambulatory Visit: Attending: Anesthesiology | Admitting: Anesthesiology

## 2024-08-19 ENCOUNTER — Encounter: Payer: Self-pay | Admitting: Anesthesiology

## 2024-08-19 DIAGNOSIS — M5030 Other cervical disc degeneration, unspecified cervical region: Secondary | ICD-10-CM

## 2024-08-19 DIAGNOSIS — G8929 Other chronic pain: Secondary | ICD-10-CM

## 2024-08-19 DIAGNOSIS — F1190 Opioid use, unspecified, uncomplicated: Secondary | ICD-10-CM

## 2024-08-19 DIAGNOSIS — M5441 Lumbago with sciatica, right side: Secondary | ICD-10-CM | POA: Diagnosis not present

## 2024-08-19 DIAGNOSIS — M47812 Spondylosis without myelopathy or radiculopathy, cervical region: Secondary | ICD-10-CM | POA: Diagnosis not present

## 2024-08-19 DIAGNOSIS — Z79891 Long term (current) use of opiate analgesic: Secondary | ICD-10-CM

## 2024-08-19 DIAGNOSIS — H2513 Age-related nuclear cataract, bilateral: Secondary | ICD-10-CM | POA: Diagnosis not present

## 2024-08-19 DIAGNOSIS — M5136 Other intervertebral disc degeneration, lumbar region with discogenic back pain only: Secondary | ICD-10-CM | POA: Diagnosis not present

## 2024-08-19 DIAGNOSIS — M19012 Primary osteoarthritis, left shoulder: Secondary | ICD-10-CM

## 2024-08-19 DIAGNOSIS — M47816 Spondylosis without myelopathy or radiculopathy, lumbar region: Secondary | ICD-10-CM | POA: Diagnosis not present

## 2024-08-19 DIAGNOSIS — G894 Chronic pain syndrome: Secondary | ICD-10-CM | POA: Diagnosis not present

## 2024-08-19 DIAGNOSIS — M5442 Lumbago with sciatica, left side: Secondary | ICD-10-CM | POA: Diagnosis not present

## 2024-08-19 DIAGNOSIS — H2512 Age-related nuclear cataract, left eye: Secondary | ICD-10-CM | POA: Diagnosis not present

## 2024-08-19 MED ORDER — HYDROCODONE-ACETAMINOPHEN 5-325 MG PO TABS: 1.0000 | ORAL_TABLET | ORAL | 0 refills | Status: AC | PRN

## 2024-08-19 MED ORDER — HYDROCODONE-ACETAMINOPHEN 5-325 MG PO TABS: 1.0000 | ORAL_TABLET | ORAL | 0 refills | Status: DC | PRN

## 2024-08-19 NOTE — Progress Notes (Unsigned)
 Virtual Visit via Telephone Note  I connected with Debra Hogan on 08/19/24 at 11:00 AM EDT by telephone and verified that I am speaking with the correct person using two identifiers.  Location: Patient: Home Provider: Pain control center   I discussed the limitations, risks, security and privacy concerns of performing an evaluation and management service by telephone and the availability of in person appointments. I also discussed with the patient that there Cubit be a patient responsible charge related to this service. The patient expressed understanding and agreed to proceed.   History of Present Illness: I spoke with Debra Hogan via telephone as we could not link for the video portion of the conversation but she reports that her neck and low back pain have been stable in nature with no recent changes.  She still taking her hydrocodone  5 mg tablets 4-5 times a day averaging 130 tablets/month and this continues to work well to keep her difficult to treat pain under control.  She generally gets about 70% relief lasting for hours before she has recurrence of her baseline pain.  Unfortunately she has failed more conservative therapy but this continues to work well for her.  At present she is experiencing an exacerbation of her MS unfortunately but feels that the pain component is well-controlled with the hydrocodone .  No change in lower extremity strength or function or bowel or bladder function is noted at this time.  Review of systems: General: No fevers or chills Pulmonary: No shortness of breath or dyspnea Cardiac: No angina or palpitations or lightheadedness GI: No abdominal pain or constipation Psych: No depression    Observations/Objective:  Current Outpatient Medications:    [START ON 10/11/2024] HYDROcodone -acetaminophen  (NORCO/VICODIN) 5-325 MG tablet, Take 1 tablet by mouth every 4 (four) hours as needed for moderate pain (pain score 4-6) or severe pain (pain score 7-10)., Disp: 130  tablet, Rfl: 0   acyclovir  (ZOVIRAX ) 400 MG tablet, Take 1 tablet (400 mg total) by mouth daily., Disp: 90 tablet, Rfl: 3   ALPRAZolam  (XANAX ) 0.25 MG tablet, Take 1 tablet (0.25 mg total) by mouth 2 (two) times daily as needed. for anxiety, Disp: 60 tablet, Rfl: 2   aspirin  EC 81 MG tablet, Take 81 mg by mouth daily., Disp: , Rfl:    cholecalciferol (VITAMIN D ) 1000 units tablet, Take 1,000 Units by mouth daily., Disp: , Rfl:    cyclobenzaprine  (FLEXERIL ) 10 MG tablet, Take 1 tablet (10 mg total) by mouth at bedtime., Disp: 30 tablet, Rfl: 5   cycloSPORINE (RESTASIS) 0.05 % ophthalmic emulsion, 1 drop 2 (two) times daily., Disp: , Rfl:    diltiazem  (CARDIZEM  CD) 180 MG 24 hr capsule, TAKE 1 CAPSULE BY MOUTH ONCE DAILY, Disp: 90 capsule, Rfl: 3   escitalopram  (LEXAPRO ) 20 MG tablet, Take 1 tablet (20 mg total) by mouth daily., Disp: 90 tablet, Rfl: 3   gabapentin  (NEURONTIN ) 100 MG capsule, Take 1 capsule (100 mg total) by mouth 3 (three) times daily., Disp: 270 capsule, Rfl: 3   [START ON 09/11/2024] HYDROcodone -acetaminophen  (NORCO/VICODIN) 5-325 MG tablet, Take 1 tablet by mouth every 4 (four) hours as needed for moderate pain (pain score 4-6) or severe pain (pain score 7-10)., Disp: 130 tablet, Rfl: 0   isosorbide  mononitrate (IMDUR ) 30 MG 24 hr tablet, Take 1 tablet (30 mg total) by mouth daily., Disp: 90 tablet, Rfl: 3   lisinopril  (ZESTRIL ) 40 MG tablet, Take 1 tablet (40 mg total) by mouth daily., Disp: 90 tablet, Rfl: 4  Multiple Vitamins-Minerals (CENTRUM SILVER ULTRA WOMENS) TABS, Take 1 tablet by mouth daily., Disp: , Rfl:    nitroGLYCERIN  (NITROSTAT ) 0.4 MG SL tablet, Place 1 tablet (0.4 mg total) under the tongue every 5 (five) minutes as needed for chest pain., Disp: 25 tablet, Rfl: 3   ondansetron  (ZOFRAN -ODT) 4 MG disintegrating tablet, Take 1 tablet (4 mg total) by mouth every 6 (six) hours as needed for nausea or vomiting., Disp: 20 tablet, Rfl: 2   PROAIR  RESPICLICK 108 (90 Base)  MCG/ACT AEPB, INHALE 1 PUFF BY MOUTH EVERY 6 HOURS AS NEEDED FOR COUGH, Disp: 1 each, Rfl: 0   rosuvastatin  (CRESTOR ) 40 MG tablet, Take 1 tablet (40 mg total) by mouth daily., Disp: 90 tablet, Rfl: 4   Tiotropium Bromide  Monohydrate (SPIRIVA  RESPIMAT) 2.5 MCG/ACT AERS, Inhale 2 puffs into the lungs daily., Disp: 60 each, Rfl: 12   tiZANidine  (ZANAFLEX ) 4 MG tablet, Take 1 tablet (4 mg total) by mouth 3 (three) times daily. (Patient not taking: Reported on 07/23/2024), Disp: 90 tablet, Rfl: 3   Past Medical History:  Diagnosis Date   Anginal pain (HCC)    Arthritis    COPD (chronic obstructive pulmonary disease) (HCC)    Depression    Dyspnea    Fibromyalgia    Headache    Heat stroke 2006   Irritable bowel syndrome    MS (multiple sclerosis) (HCC)    Followed by Dr. Maree   Osteoarthritis of left shoulder 01/12/2022   Assessment and Plan:  1. Chronic bilateral low back pain with bilateral sciatica   2. Chronic pain syndrome   3. Chronic, continuous use of opioids   4. Lumbar facet joint syndrome   5. Primary osteoarthritis of left shoulder   6. Facet arthropathy, cervical   7. Degeneration of intervertebral disc of lumbar region with discogenic back pain   8. DDD (degenerative disc disease), cervical    Based our conversation and after review of the [facility]  practitioner database information it is appropriate to once again refill her medicines for the next 2 months dated for September 18 and October 18.  She is doing well with his current regimen and it is enabling her to keep her pain under decent control with no side effects reported.  No change in her protocol will be initiated have encouraged her to continue with stretching strengthening exercises and we will schedule her for a 3-month return.  Continue follow-up with her primary care physicians for baseline medical care. Follow Up Instructions:    I discussed the assessment and treatment plan with the patient. The patient  was provided an opportunity to ask questions and all were answered. The patient agreed with the plan and demonstrated an understanding of the instructions.   The patient was advised to call back or seek an in-person evaluation if the symptoms worsen or if the condition fails to improve as anticipated.  I provided 30 minutes of non-face-to-face time during this encounter.   Lynwood KANDICE Clause, MD

## 2024-08-20 ENCOUNTER — Encounter: Payer: Self-pay | Admitting: Ophthalmology

## 2024-08-21 NOTE — Discharge Instructions (Signed)

## 2024-08-27 ENCOUNTER — Ambulatory Visit
Admission: RE | Admit: 2024-08-27 | Discharge: 2024-08-27 | Disposition: A | Discharge status: Home / Self Care | Attending: Ophthalmology | Admitting: Ophthalmology

## 2024-08-27 ENCOUNTER — Encounter
Admission: RE | Disposition: A | Discharge status: Home / Self Care | Payer: Self-pay | Source: Home / Self Care | Attending: Ophthalmology

## 2024-08-27 ENCOUNTER — Ambulatory Visit: Payer: Self-pay | Admitting: Anesthesiology

## 2024-08-27 ENCOUNTER — Other Ambulatory Visit: Payer: Self-pay

## 2024-08-27 ENCOUNTER — Encounter: Payer: Self-pay | Admitting: Ophthalmology

## 2024-08-27 DIAGNOSIS — I130 Hypertensive heart and chronic kidney disease with heart failure and stage 1 through stage 4 chronic kidney disease, or unspecified chronic kidney disease: Secondary | ICD-10-CM | POA: Insufficient documentation

## 2024-08-27 DIAGNOSIS — H2512 Age-related nuclear cataract, left eye: Secondary | ICD-10-CM | POA: Insufficient documentation

## 2024-08-27 DIAGNOSIS — N189 Chronic kidney disease, unspecified: Secondary | ICD-10-CM | POA: Insufficient documentation

## 2024-08-27 DIAGNOSIS — I509 Heart failure, unspecified: Secondary | ICD-10-CM | POA: Diagnosis not present

## 2024-08-27 DIAGNOSIS — E785 Hyperlipidemia, unspecified: Secondary | ICD-10-CM | POA: Diagnosis not present

## 2024-08-27 DIAGNOSIS — F17210 Nicotine dependence, cigarettes, uncomplicated: Secondary | ICD-10-CM | POA: Diagnosis not present

## 2024-08-27 HISTORY — DX: Dependence on supplemental oxygen: Z99.81

## 2024-08-27 HISTORY — DX: Chronic obstructive pulmonary disease, unspecified: J44.9

## 2024-08-27 HISTORY — DX: Atherosclerotic heart disease of native coronary artery without angina pectoris: I25.10

## 2024-08-27 HISTORY — DX: Cerebral infarction, unspecified: I63.9

## 2024-08-27 HISTORY — DX: Heart failure, unspecified: I50.9

## 2024-08-27 HISTORY — DX: Acute myocardial infarction, unspecified: I21.9

## 2024-08-27 HISTORY — DX: Unspecified asthma, uncomplicated: J45.909

## 2024-08-27 HISTORY — DX: Essential (primary) hypertension: I10

## 2024-08-27 HISTORY — DX: Anxiety disorder, unspecified: F41.9

## 2024-08-27 HISTORY — DX: Chronic kidney disease, unspecified: N18.9

## 2024-08-27 HISTORY — DX: Sleep apnea, unspecified: G47.30

## 2024-08-27 HISTORY — PX: CATARACT EXTRACTION W/PHACO: SHX586

## 2024-08-27 SURGERY — PHACOEMULSIFICATION, CATARACT, WITH IOL INSERTION
Anesthesia: Monitor Anesthesia Care | Laterality: Left

## 2024-08-27 MED ORDER — DEXMEDETOMIDINE HCL IN NACL 80 MCG/20ML IV SOLN
INTRAVENOUS | Status: DC | PRN
  Administered 2024-08-27: 12 ug via INTRAVENOUS

## 2024-08-27 MED ORDER — FENTANYL CITRATE (PF) 100 MCG/2ML IJ SOLN
INTRAMUSCULAR | Status: DC | PRN
  Administered 2024-08-27 (×2): 50 ug via INTRAVENOUS

## 2024-08-27 MED ORDER — CEFUROXIME OPHTHALMIC INJECTION 1 MG/0.1 ML
INJECTION | OPHTHALMIC | Status: DC | PRN
  Administered 2024-08-27: .1 mL via INTRACAMERAL

## 2024-08-27 MED ORDER — TETRACAINE HCL 0.5 % OP SOLN
1.0000 [drp] | OPHTHALMIC | Status: DC | PRN
  Administered 2024-08-27 (×3): 1 [drp] via OPHTHALMIC

## 2024-08-27 MED ORDER — SIGHTPATH DOSE#1 NA HYALUR & NA CHOND-NA HYALUR IO KIT
PACK | INTRAOCULAR | Status: DC | PRN
  Administered 2024-08-27: 1 via OPHTHALMIC

## 2024-08-27 MED ORDER — FLUMAZENIL 0.5 MG/5ML IV SOLN
INTRAVENOUS | Status: DC | PRN
  Administered 2024-08-27: .2 mg via INTRAVENOUS

## 2024-08-27 MED ORDER — ARMC OPHTHALMIC DILATING DROPS
OPHTHALMIC | Status: AC
  Filled 2024-08-27: qty 0.5

## 2024-08-27 MED ORDER — SIGHTPATH DOSE#1 BSS IO SOLN
INTRAOCULAR | Status: DC | PRN
  Administered 2024-08-27: 47 mL via OPHTHALMIC

## 2024-08-27 MED ORDER — SIGHTPATH DOSE#1 BSS IO SOLN
INTRAOCULAR | Status: DC | PRN
  Administered 2024-08-27: 15 mL via INTRAOCULAR

## 2024-08-27 MED ORDER — LIDOCAINE HCL (PF) 2 % IJ SOLN
INTRAOCULAR | Status: DC | PRN
  Administered 2024-08-27: 1 mL

## 2024-08-27 MED ORDER — LACTATED RINGERS IV SOLN: INTRAVENOUS | Status: DC

## 2024-08-27 MED ORDER — ARMC OPHTHALMIC DILATING DROPS
1.0000 | OPHTHALMIC | Status: DC | PRN
  Administered 2024-08-27 (×2): 1 via OPHTHALMIC

## 2024-08-27 MED ORDER — FENTANYL CITRATE (PF) 100 MCG/2ML IJ SOLN
INTRAMUSCULAR | Status: AC
  Filled 2024-08-27: qty 2

## 2024-08-27 MED ORDER — TETRACAINE HCL 0.5 % OP SOLN
OPHTHALMIC | Status: AC
  Filled 2024-08-27: qty 4

## 2024-08-27 MED ORDER — BRIMONIDINE TARTRATE-TIMOLOL 0.2-0.5 % OP SOLN
OPHTHALMIC | Status: DC | PRN
  Administered 2024-08-27: 1 [drp] via OPHTHALMIC

## 2024-08-27 MED ORDER — MIDAZOLAM HCL 2 MG/2ML IJ SOLN
INTRAMUSCULAR | Status: AC
Start: 2024-08-27 — End: 2024-08-27
  Filled 2024-08-27: qty 2

## 2024-08-27 MED ORDER — TETRACAINE 0.5 % OP SOLN OPTIME - NO CHARGE
OPHTHALMIC | Status: DC | PRN
  Administered 2024-08-27: 2 [drp] via OPHTHALMIC

## 2024-08-27 MED ORDER — MIDAZOLAM HCL 2 MG/2ML IJ SOLN
INTRAMUSCULAR | Status: DC | PRN
  Administered 2024-08-27 (×2): 1 mg via INTRAVENOUS

## 2024-08-27 SURGICAL SUPPLY — 8 items
FEE CATARACT SUITE SIGHTPATH (MISCELLANEOUS) ×1 IMPLANT
GLOVE BIOGEL PI IND STRL 8 (GLOVE) ×1 IMPLANT
GLOVE SURG LX STRL 7.5 STRW (GLOVE) ×1 IMPLANT
GLOVE SURG SYN 6.5 PF PI BL (GLOVE) ×1 IMPLANT
LENS IOL TECNIS EYHANCE 20.0 (Intraocular Lens) IMPLANT
NDL FILTER BLUNT 18X1 1/2 (NEEDLE) ×1 IMPLANT
NEEDLE FILTER BLUNT 18X1 1/2 (NEEDLE) ×1 IMPLANT
SYR 3ML LL SCALE MARK (SYRINGE) ×1 IMPLANT

## 2024-08-27 NOTE — Op Note (Signed)
 OPERATIVE NOTE  Debra Hogan [id number] 08/27/2024   PREOPERATIVE DIAGNOSIS:  Nuclear sclerotic cataract left eye. H25.12   POSTOPERATIVE DIAGNOSIS:    Nuclear sclerotic cataract left eye.     PROCEDURE:  Phacoemusification with posterior chamber intraocular lens placement of the left eye  Ultrasound time: Procedure(s): PHACOEMULSIFICATION, CATARACT, WITH IOL INSERTION 2.85, 00:19.0 (Left)  LENS:   Implant Name Type Inv. Item Serial No. Manufacturer Lot No. LRB No. Used Action  LENS IOL TECNIS EYHANCE 20.0 - [id number] Intraocular Lens LENS IOL TECNIS EYHANCE 20.0 [id number] [employer]  Left 1 Implanted      SURGEON:  Dene FABIENE Etienne, MD   ANESTHESIA:  Topical with tetracaine  drops and 2% Xylocaine  jelly, augmented with 1% preservative-free intracameral lidocaine .    COMPLICATIONS:  None.   DESCRIPTION OF PROCEDURE:  The patient was identified in the holding room and transported to the operating room and placed in the supine position under the operating microscope.  The left eye was identified as the operative eye and it was prepped and draped in the usual sterile ophthalmic fashion.   A 1 millimeter clear-corneal paracentesis was made at the 1:30 position.  0.5 ml of preservative-free 1% lidocaine  was injected into the anterior chamber.  The anterior chamber was filled with Viscoat viscoelastic.  A 2.4 millimeter keratome was used to make a near-clear corneal incision at the 10:30 position.  .  A curvilinear capsulorrhexis was made with a cystotome and capsulorrhexis forceps.  Balanced salt solution was used to hydrodissect and hydrodelineate the nucleus.   Phacoemulsification was then used in stop and chop fashion to remove the lens nucleus and epinucleus.  The remaining cortex was then removed using the irrigation and aspiration handpiece. Provisc was then placed into the capsular bag to distend it for lens placement.  A lens was then injected into the capsular bag.  The  remaining viscoelastic was aspirated.   Wounds were hydrated with balanced salt solution.  The anterior chamber was inflated to a physiologic pressure with balanced salt solution.  No wound leaks were noted. Cefuroxime  0.1 ml of a 10mg /ml solution was injected into the anterior chamber for a dose of 1 mg of intracameral antibiotic at the completion of the case.   Timolol  and Brimonidine  drops were applied to the eye.  The patient was taken to the recovery room in stable condition without complications of anesthesia or surgery.  Debra Hogan 08/27/2024, 12:04 PM

## 2024-08-27 NOTE — Anesthesia Postprocedure Evaluation (Signed)
 Anesthesia Post Note  Patient: Debra Hogan  Procedure(s) Performed: PHACOEMULSIFICATION, CATARACT, WITH IOL INSERTION 2.85, 00:19.0 (Left)  Patient location during evaluation: PACU Anesthesia Type: MAC Level of consciousness: awake and alert Pain management: pain level controlled Vital Signs Assessment: post-procedure vital signs reviewed and stable Respiratory status: spontaneous breathing, nonlabored ventilation, respiratory function stable and patient connected to nasal cannula oxygen  Cardiovascular status: stable and blood pressure returned to baseline Postop Assessment: no apparent nausea or vomiting Anesthetic complications: no   No notable events documented.   Last Vitals:  Vitals:   08/27/24 1210 08/27/24 1215  BP: 114/67 125/61  Pulse: (!) 105 (!) 102  Resp: 17 (!) 21  Temp:  (!) 36.4 C  SpO2: 98% 96%    Last Pain:  Vitals:   08/27/24 1215  TempSrc:   PainSc: 0-No pain                 Kenli Waldo C Prentiss Polio

## 2024-08-27 NOTE — H&P (Signed)
 [facility] [address]   Primary Care Physician:  Dineen Rollene MATSU, FNP Ophthalmologist: Dr. Dene Etienne  Pre-Procedure History & Physical: HPI:  Debra Hogan is a 63 y.o. female here for ophthalmic surgery.   Past Medical History:  Diagnosis Date   Anginal pain (HCC)    Anxiety    Arthritis    Asthma    CHF (congestive heart failure) (HCC)    Chronic kidney disease    COPD (chronic obstructive pulmonary disease) (HCC)    COPD, very severe (HCC)    Coronary artery disease    Depression    Dyspnea    emphasema   Fibromyalgia    pt has MS   Headache    Heat stroke 2006   Hypertension    Irritable bowel syndrome    MS (multiple sclerosis) (HCC)    Followed by Dr. Maree   Myocardial infarction [facility])    On supplemental oxygen  therapy    Osteoarthritis of left shoulder 01/12/2022   Sleep apnea    not being treated for   Stroke (HCC)    heat strok 28 years ago    Past Surgical History:  Procedure Laterality Date   ABDOMINAL HYSTERECTOMY     CERVIX intact; had for noncancerous reasons, endometriosis   CHOLECYSTECTOMY N/A 01/03/2016   Procedure: LAPAROSCOPIC CHOLECYSTECTOMY WITH INTRAOPERATIVE CHOLANGIOGRAM;  Surgeon: Charlie FORBES Fell, MD;  Location: [facility] ORS;  Service: General;  Laterality: N/A;   ENDOSCOPIC RETROGRADE CHOLANGIOPANCREATOGRAPHY (ERCP) WITH PROPOFOL  N/A 01/04/2016   Procedure: ENDOSCOPIC RETROGRADE CHOLANGIOPANCREATOGRAPHY (ERCP) WITH PROPOFOL ;  Surgeon: Rogelia Copping, MD;  Location: [facility] ENDOSCOPY;  Service: Endoscopy;  Laterality: N/A;   FOOT SURGERY  2001   LAPAROSCOPIC ENDOMETRIOSIS FULGURATION     RIGHT/LEFT HEART CATH AND CORONARY ANGIOGRAPHY Bilateral 09/29/2019   Procedure: RIGHT/LEFT HEART CATH AND CORONARY ANGIOGRAPHY;  Surgeon: Mady Bruckner, MD;  Location: [facility] INVASIVE CV LAB;  Service: Cardiovascular;  Laterality: Bilateral;    Prior to Admission medications   Medication Sig Start Date End Date Taking? Authorizing Provider   acyclovir  (ZOVIRAX ) 400 MG tablet Take 1 tablet (400 mg total) by mouth daily. 04/29/24  Yes Dineen Rollene MATSU, FNP  ALPRAZolam  (XANAX ) 0.25 MG tablet Take 1 tablet (0.25 mg total) by mouth 2 (two) times daily as needed. for anxiety 04/29/24  Yes Dineen Rollene MATSU, FNP  aspirin  EC 81 MG tablet Take 81 mg by mouth daily.   Yes [provider]  cholecalciferol (VITAMIN D ) 1000 units tablet Take 1,000 Units by mouth daily.   Yes [provider]  cycloSPORINE (RESTASIS) 0.05 % ophthalmic emulsion 1 drop 2 (two) times daily. 11/21/23  Yes [provider]  diltiazem  (CARDIZEM  CD) 180 MG 24 hr capsule TAKE 1 CAPSULE BY MOUTH ONCE DAILY 04/29/24  Yes Dineen Rollene MATSU, FNP  Dimethyl Fumarate  (TECFIDERA ) 240 MG CPDR Take 1 mg by mouth daily.   Yes [provider]  escitalopram  (LEXAPRO ) 20 MG tablet Take 1 tablet (20 mg total) by mouth daily. 04/29/24  Yes Dineen Rollene MATSU, FNP  fluticasone -salmeterol (ADVAIR) 250-50 MCG/ACT AEPB Inhale 1 puff into the lungs in the morning and at bedtime.   Yes [provider]  gabapentin  (NEURONTIN ) 100 MG capsule Take 1 capsule (100 mg total) by mouth 3 (three) times daily. 04/29/24  Yes Arnett, Rollene MATSU, FNP  HYDROcodone -acetaminophen  (NORCO/VICODIN) 5-325 MG tablet Take 1 tablet by mouth every 4 (four) hours as needed for moderate pain (pain score 4-6) or severe pain (pain score 7-10). 09/11/24 10/11/24  Yes Myra Lynwood MATSU, MD  HYDROcodone -acetaminophen  (NORCO/VICODIN) 5-325 MG tablet Take 1 tablet by mouth every 4 (four) hours as needed for moderate pain (pain score 4-6) or severe pain (pain score 7-10). 10/11/24 11/10/24 Yes Myra Lynwood MATSU, MD  lisinopril  (ZESTRIL ) 40 MG tablet Take 1 tablet (40 mg total) by mouth daily. 05/06/24  Yes Dineen Rollene MATSU, FNP  Multiple Vitamins-Minerals (CENTRUM SILVER ULTRA WOMENS) TABS Take 1 tablet by mouth daily.   Yes [provider]  nitroGLYCERIN  (NITROSTAT ) 0.4 MG SL tablet Place 1  tablet (0.4 mg total) under the tongue every 5 (five) minutes as needed for chest pain. 12/10/23 12/09/24 Yes Fountain, Madison L, NP  ondansetron  (ZOFRAN -ODT) 4 MG disintegrating tablet Take 1 tablet (4 mg total) by mouth every 6 (six) hours as needed for nausea or vomiting. 01/06/21  Yes Dineen Rollene MATSU, FNP  PROAIR  RESPICLICK 108 (90 Base) MCG/ACT AEPB INHALE 1 PUFF BY MOUTH EVERY 6 HOURS AS NEEDED FOR COUGH 07/03/23  Yes Arnett, Rollene MATSU, FNP  rosuvastatin  (CRESTOR ) 40 MG tablet Take 1 tablet (40 mg total) by mouth daily. 05/06/24  Yes Arnett, Rollene MATSU, FNP  Tiotropium Bromide  Monohydrate (SPIRIVA  RESPIMAT) 2.5 MCG/ACT AERS Inhale 2 puffs into the lungs daily. 12/12/23  Yes Dgayli, Belva, MD  tiZANidine  (ZANAFLEX ) 4 MG tablet Take 1 tablet (4 mg total) by mouth 3 (three) times daily. 06/02/24 09/30/24 Yes Myra Lynwood MATSU, MD  isosorbide  mononitrate (IMDUR ) 30 MG 24 hr tablet Take 1 tablet (30 mg total) by mouth daily. 12/10/23 07/23/24  Rana Lum CROME, NP    Allergies as of 07/24/2024 - Review Complete 07/23/2024  Allergen Reaction Noted   Valium [diazepam] Anxiety 10/21/2012    Family History  Problem Relation Age of Onset   Hypertension Mother    Hyperlipidemia Mother    Arthritis Mother    Cancer Father        throat   Heart disease Brother    Bipolar disorder Son    Schizophrenia Son    Cancer Maternal Grandfather        liver   Cancer Paternal Grandmother        liver    Social History   Socioeconomic History   Marital status: Married    Spouse name: Not on file   Number of children: Not on file   Years of education: Not on file   Highest education level: GED or equivalent  Occupational History   Not on file  Tobacco Use   Smoking status: Every Day    Current packs/day: 1.00    Average packs/day: 1 pack/day for 52.7 years (52.7 ttl pk-yrs)    Types: Cigarettes    Start date: 1973   Smokeless tobacco: Never  Vaping Use   Vaping status: Never Used   Substance and Sexual Activity   Alcohol use: No    Alcohol/week: 0.0 standard drinks of alcohol   Drug use: Yes    Types: Marijuana, Cocaine    Comment: 30 years ago   Sexual activity: Not Currently    Birth control/protection: Post-menopausal  Other Topics Concern   Not on file  Social History Narrative   Lives in [place]. Widowed x 2.      Velma Son died 12-14-2022 at 26 years of age.       Married for 14  years.       On disability      Social Drivers of Health   Financial Resource Strain: Medium Risk (07/23/2024)  Overall Financial Resource Strain (CARDIA)    Difficulty of Paying Living Expenses: Somewhat hard  Food Insecurity: No Food Insecurity (07/23/2024)   Hunger Vital Sign    Worried About Running Out of Food in the Last Year: Never true    Ran Out of Food in the Last Year: Never true  Transportation Needs: No Transportation Needs (07/23/2024)   PRAPARE - Administrator, Civil Service (Medical): No    Lack of Transportation (Non-Medical): No  Physical Activity: Inactive (07/23/2024)   Exercise Vital Sign    Days of Exercise per Week: 0 days    Minutes of Exercise per Session: 0 min  Stress: Stress Concern Present (07/23/2024)   [employer] of Occupational Health - Occupational Stress Questionnaire    Feeling of Stress: To some extent  Social Connections: Socially Isolated (07/23/2024)   Social Connection and Isolation Panel    Frequency of Communication with Friends and Family: Once a week    Frequency of Social Gatherings with Friends and Family: Never    Attends Religious Services: Never    Database administrator or Organizations: No    Attends Engineer, structural: Not on file    Marital Status: Married  Catering manager Violence: Not At Risk (07/23/2024)   Humiliation, Afraid, Rape, and Kick questionnaire    Fear of Current or Ex-Partner: No    Emotionally Abused: No    Physically Abused: No    Sexually Abused: No    Review of  Systems: See HPI, otherwise negative ROS  Physical Exam: BP 116/77   Pulse (!) 117   Temp 98 F (36.7 C) (Temporal)   Resp 17   Ht 5' 4 (1.626 m)   Wt 64.7 kg   SpO2 91%   BMI 24.48 kg/m  General:   Alert,  pleasant and cooperative in NAD Head:  Normocephalic and atraumatic. Lungs:  Clear to auscultation.    Heart:  Regular rate and rhythm.   Impression/Plan: Debra Hogan is here for ophthalmic surgery.  Risks, benefits, limitations, and alternatives regarding ophthalmic surgery have been reviewed with the patient.  Questions have been answered.  All parties agreeable.   MITTIE GASKIN, MD  08/27/2024, 10:43 AM

## 2024-08-27 NOTE — Anesthesia Preprocedure Evaluation (Addendum)
 Anesthesia Evaluation  Patient identified by MRN, date of birth, ID band Patient awake    Reviewed: Allergy & Precautions, H&P , NPO status , Patient's Chart, lab work & pertinent test results  Airway Mallampati: I  TM Distance: >3 FB Neck ROM: Full    Dental no notable dental hx. (+) Chipped Lower bridge :   Pulmonary Current Smoker and Patient abstained from smoking.    + decreased breath sounds      Cardiovascular hypertension, Normal cardiovascular exam Rhythm:Regular Rate:Normal  06-12-24 1. Left ventricular ejection fraction, by estimation, is 60 to 65%. The  left ventricle has normal function. The left ventricle has no regional  wall motion abnormalities. Left ventricular diastolic parameters are  indeterminate.   2. Right ventricular systolic function is normal. The right ventricular  size is normal.   3. A small pericardial effusion is present. The pericardial effusion is  circumferential. 1 cm off the RV and LV free wall.   4. The mitral valve is normal in structure. No evidence of mitral valve  regurgitation. No evidence of mitral stenosis.   5. The aortic valve is normal in structure. Aortic valve regurgitation is  not visualized. No aortic stenosis is present.   6. The inferior vena cava is normal in size with greater than 50%  respiratory variability, suggesting right atrial pressure of 3 mmHg.     Neuro/Psych negative neurological ROS  negative psych ROS   GI/Hepatic negative GI ROS, Neg liver ROS,,,  Endo/Other  negative endocrine ROS    Renal/GU negative Renal ROS  negative genitourinary   Musculoskeletal negative musculoskeletal ROS (+)    Abdominal   Peds negative pediatric ROS (+)  Hematology negative hematology ROS (+)   Anesthesia Other Findings Irritable bowel syndrome  Depression Arthritis  Very severe COPD (chronic obstructive pulmonary disease) (HCC) On oxygen  therapy MS (multiple  sclerosis) (HCC)  Fibromyalgia Anginal pain (HCC)  Heat stroke Dyspnea  Headache Osteoarthritis of left shoulder  CHF (congestive heart failure) (HCC) Myocardial infarction (HCC)  Hypertension Coronary artery disease  Stroke (HCC) Asthma  Sleep apnea Chronic kidney disease  Anxiety    Reproductive/Obstetrics negative OB ROS                              Anesthesia Physical Anesthesia Plan  ASA: 4  Anesthesia Plan: MAC   Post-op Pain Management:    Induction: Intravenous  PONV Risk Score and Plan:   Airway Management Planned: Natural Airway and Nasal Cannula  Additional Equipment:   Intra-op Plan:   Post-operative Plan:   Informed Consent: I have reviewed the patients History and Physical, chart, labs and discussed the procedure including the risks, benefits and alternatives for the proposed anesthesia with the patient or authorized representative who has indicated his/her understanding and acceptance.     Dental Advisory Given  Plan Discussed with: Anesthesiologist, CRNA and Surgeon  Anesthesia Plan Comments: (Patient consented for risks of anesthesia including but not limited to:  - adverse reactions to medications - damage to eyes, teeth, lips or other oral mucosa - nerve damage due to positioning  - sore throat or hoarseness - Damage to heart, brain, nerves, lungs, other parts of body or loss of life  Patient voiced understanding and assent.)         Anesthesia Quick Evaluation

## 2024-08-27 NOTE — Transfer of Care (Signed)
 Immediate Anesthesia Transfer of Care Note  Patient: Debra Hogan  Procedure(s) Performed: PHACOEMULSIFICATION, CATARACT, WITH IOL INSERTION 2.85, 00:19.0 (Left)  Patient Location: PACU  Anesthesia Type: MAC  Level of Consciousness: awake, alert  and patient cooperative  Airway and Oxygen  Therapy: Patient Spontanous Breathing and Patient connected to supplemental oxygen   Post-op Assessment: Post-op Vital signs reviewed, Patient's Cardiovascular Status Stable, Respiratory Function Stable, Patent Airway and No signs of Nausea or vomiting  Post-op Vital Signs: Reviewed and stable  Complications: No notable events documented.

## 2024-08-28 DIAGNOSIS — H2511 Age-related nuclear cataract, right eye: Secondary | ICD-10-CM | POA: Diagnosis not present

## 2024-09-03 DIAGNOSIS — I671 Cerebral aneurysm, nonruptured: Secondary | ICD-10-CM | POA: Diagnosis not present

## 2024-09-03 DIAGNOSIS — G4733 Obstructive sleep apnea (adult) (pediatric): Secondary | ICD-10-CM | POA: Diagnosis not present

## 2024-09-04 ENCOUNTER — Ambulatory Visit: Admitting: Family

## 2024-09-16 ENCOUNTER — Other Ambulatory Visit

## 2024-09-18 ENCOUNTER — Other Ambulatory Visit

## 2024-09-18 ENCOUNTER — Encounter: Payer: Self-pay | Admitting: Ophthalmology

## 2024-09-18 ENCOUNTER — Other Ambulatory Visit: Payer: Self-pay

## 2024-09-18 DIAGNOSIS — R809 Proteinuria, unspecified: Secondary | ICD-10-CM

## 2024-09-19 ENCOUNTER — Encounter: Payer: Self-pay | Admitting: Ophthalmology

## 2024-09-22 NOTE — Discharge Instructions (Signed)

## 2024-09-23 NOTE — Anesthesia Preprocedure Evaluation (Signed)
 Anesthesia Evaluation    Airway Mallampati: III       Dental  (+) Chipped, Partial Lower Chipped Lower bridge :   Pulmonary shortness of breath, asthma , sleep apnea , COPD, Current Smoker and Patient abstained from smoking.    + wheezing      Cardiovascular hypertension, + angina  + CAD, + Past MI and +CHF   Rhythm:Regular  06-12-24 1. Left ventricular ejection fraction, by estimation, is 60 to 65%. The  left ventricle has normal function. The left ventricle has no regional  wall motion abnormalities. Left ventricular diastolic parameters are  indeterminate.   2. Right ventricular systolic function is normal. The right ventricular  size is normal.   3. A small pericardial effusion is present. The pericardial effusion is  circumferential. 1 cm off the RV and LV free wall.   4. The mitral valve is normal in structure. No evidence of mitral valve  regurgitation. No evidence of mitral stenosis.   5. The aortic valve is normal in structure. Aortic valve regurgitation is  not visualized. No aortic stenosis is present.   6. The inferior vena cava is normal in size with greater than 50%  respiratory variability, suggesting right atrial pressure of 3 mmHg.     Neuro/Psych  Headaches PSYCHIATRIC DISORDERS Anxiety Depression     Neuromuscular disease CVA    GI/Hepatic ,GERD  ,,  Endo/Other  Hypothyroidism    Renal/GU Renal disease     Musculoskeletal  (+) Arthritis ,  Fibromyalgia -  Abdominal   Peds  Hematology   Anesthesia Other Findings Previous cataract 08-27-24 Dr. Ola  States her multiple sclerosis is worse at present, was quite weak, gait unsteady and needed assistance to walk, used walker but still unsteady and needed assistance to not fall.  Very short of breath, increased work of breathing, initial pulse oximetry 80%, on auscultation bilateral wheezing. With rest and oxygen  per nasal cannula, pulse oximetry came  up to 91%, patient refused duoneb and CXR, states she wants to have her doctor check her so she is to go to her doctor now.  Today, exacerbation of COPD.  Coughing last night. Will reschedule when better optimized.   Irritable bowel syndrome  Depression Arthritis  COPD (chronic obstructive pulmonary disease) very severe MS (multiple sclerosis)  Fibromyalgia Anginal pain Heat stroke Dyspnea Headache Osteoarthritis of left shoulder  CHF (congestive heart failure) (HCC) Myocardial infarction (HCC)  Hypertension Coronary artery disease  Stroke (HCC) Asthma  Sleep apnea Chronic kidney disease  Anxiety On supplemental oxygen  therapy  COPD, very severe     Reproductive/Obstetrics                              Anesthesia Physical Anesthesia Plan  ASA: 4  Anesthesia Plan: MAC   Post-op Pain Management:    Induction: Intravenous  PONV Risk Score and Plan:   Airway Management Planned: Natural Airway and Nasal Cannula  Additional Equipment:   Intra-op Plan:   Post-operative Plan:   Informed Consent: I have reviewed the patients History and Physical, chart, labs and discussed the procedure including the risks, benefits and alternatives for the proposed anesthesia with the patient or authorized representative who has indicated his/her understanding and acceptance.     Dental Advisory Given  Plan Discussed with: Anesthesiologist, CRNA and Surgeon  Anesthesia Plan Comments: (Patient consented for risks of anesthesia including but not limited to:  - adverse reactions to medications -  damage to eyes, teeth, lips or other oral mucosa - nerve damage due to positioning  - sore throat or hoarseness - Damage to heart, brain, nerves, lungs, other parts of body or loss of life  Patient voiced understanding and assent.)         Anesthesia Quick Evaluation

## 2024-09-24 ENCOUNTER — Encounter: Admission: RE | Disposition: A | Payer: Self-pay | Source: Home / Self Care | Attending: Ophthalmology

## 2024-09-24 ENCOUNTER — Encounter: Payer: Self-pay | Admitting: Anesthesiology

## 2024-09-24 ENCOUNTER — Other Ambulatory Visit: Payer: Self-pay

## 2024-09-24 ENCOUNTER — Ambulatory Visit
Admission: RE | Admit: 2024-09-24 | Discharge: 2024-09-24 | Disposition: A | Discharge status: Home / Self Care | Attending: Ophthalmology | Admitting: Ophthalmology

## 2024-09-24 DIAGNOSIS — Z539 Procedure and treatment not carried out, unspecified reason: Secondary | ICD-10-CM | POA: Insufficient documentation

## 2024-09-24 DIAGNOSIS — H2511 Age-related nuclear cataract, right eye: Secondary | ICD-10-CM | POA: Insufficient documentation

## 2024-09-24 DIAGNOSIS — J441 Chronic obstructive pulmonary disease with (acute) exacerbation: Secondary | ICD-10-CM | POA: Insufficient documentation

## 2024-09-24 SURGERY — PHACOEMULSIFICATION, CATARACT, WITH IOL INSERTION
Anesthesia: Topical | Laterality: Right

## 2024-09-24 MED ORDER — ARMC OPHTHALMIC DILATING DROPS
OPHTHALMIC | Status: AC
  Filled 2024-09-24: qty 0.5

## 2024-09-24 MED ORDER — ARMC OPHTHALMIC DILATING DROPS: 1.0000 | OPHTHALMIC | Status: DC | PRN

## 2024-09-24 MED ORDER — IPRATROPIUM-ALBUTEROL 0.5-2.5 (3) MG/3ML IN SOLN
RESPIRATORY_TRACT | Status: AC
  Filled 2024-09-24: qty 3

## 2024-09-24 MED ORDER — LACTATED RINGERS IV SOLN: INTRAVENOUS | Status: DC

## 2024-09-24 MED ORDER — TETRACAINE HCL 0.5 % OP SOLN: 1.0000 [drp] | OPHTHALMIC | Status: DC | PRN

## 2024-09-24 MED ORDER — TETRACAINE HCL 0.5 % OP SOLN
OPHTHALMIC | Status: AC
  Filled 2024-09-24: qty 4

## 2024-09-24 SURGICAL SUPPLY — 14 items
BNDG EYE OVAL 2 1/8 X 2 5/8 (GAUZE/BANDAGES/DRESSINGS) IMPLANT
CANNULA ANT/CHMB 27G (MISCELLANEOUS) IMPLANT
FEE CATARACT SUITE SIGHTPATH (MISCELLANEOUS) ×1 IMPLANT
GLOVE BIOGEL PI IND STRL 8 (GLOVE) ×1 IMPLANT
GLOVE PI ULTRA LF STRL 7.5 (GLOVE) IMPLANT
GLOVE SURG LX STRL 7.5 STRW (GLOVE) ×1 IMPLANT
GLOVE SURG SYN 6.5 PF PI BL (GLOVE) ×1 IMPLANT
NDL RETROBULBAR .5 NSTRL (NEEDLE) IMPLANT
NEEDLE FILTER BLUNT 18X1 1/2 (NEEDLE) ×1 IMPLANT
PACK VIT ANT 23G (MISCELLANEOUS) IMPLANT
RING MALYGIN 7.0 (MISCELLANEOUS) IMPLANT
SUT VICRYL 9 0 (SUTURE) IMPLANT
SUTURE EHLN 10-0 CS-B-6CS-B-6 (SUTURE) IMPLANT
SYR 3ML LL SCALE MARK (SYRINGE) ×1 IMPLANT

## 2024-09-24 NOTE — Progress Notes (Signed)
 Patient reports increased dyspnea, reports coughing last night, is observed with increased Work of Breathing, states she is short of breath,  and on auscultation, is wheezing bilaterally, more in lower lobes than upper, but still very obvious wheezing all lung fields bilaterally.  Initial pulse oximetry 80%, but with rest and oxygen , per nasal cannula, pulse oximetry came up to 91%. Explained to patient that she needs breathing optimized better prior to repeat cataract surgery.  Offered duoneb and CXR, but patient states she prefers to go to her doctor and let her doctor order breathing treatment and CXR.  Notified Dr. Mittie, will get rescheduled when she is breathing better and not actively dyspneic, heavily wheezing, and so hypoxic as she is now.

## 2024-09-24 NOTE — H&P (Signed)
  Cancelled due to COPD exacerbation.

## 2024-09-24 NOTE — Progress Notes (Signed)
 Case cancelled per Anesthesia MD [place], due to pT O2 sat in low 80s while on O2 3L [place]. SOB, Increased WOB.Pt plans to contact her MD upon discharge.

## 2024-09-25 ENCOUNTER — Ambulatory Visit: Payer: Self-pay

## 2024-09-25 NOTE — Telephone Encounter (Signed)
 FYI Only or Action Required?: Action required by provider: request for appointment.  Patient was last seen in primary care on 04/29/2024 by Dineen Rollene MATSU, FNP.  Called Nurse Triage reporting Shortness of Breath.  Symptoms began today.  Interventions attempted: Nothing.  Symptoms are: stable. Was supposed to have eye surgery but O2 sats were 82-84% per pt. SOB has worsened.  Triage Disposition: See PCP When Office is Open (Within 3 Days)  Patient/caregiver understands and will follow disposition?:    Copied from CRM #[id number]. Topic: Clinical - Red Word Triage >> Sep 25, 2024  9:41 AM B[place] KIDD wrote: [facility] that prompted transfer to Nurse Triage: trying to get my eye surgery done . went in to do  it today and they refused to do it because my oxygen  levels are low  Dr isadora in [place] Answer Assessment - Initial Assessment Questions 1. RESPIRATORY STATUS: Describe your breathing? (e.g., wheezing, shortness of breath, unable to speak, severe coughing)      SOB getting worse 2. ONSET: When did this breathing problem begin?      On going issue 3. PATTERN Does the difficult breathing come and go, or has it been constant since it started?      constant 4. SEVERITY: How bad is your breathing? (e.g., mild, moderate, severe)      moderate 5. RECURRENT SYMPTOM: Have you had difficulty breathing before? If Yes, ask: When was the last time? and What happened that time?      yes 6. CARDIAC HISTORY: Do you have any history of heart disease? (e.g., heart attack, angina, bypass surgery, angioplasty)      YES 7. LUNG HISTORY: Do you have any history of lung disease?  (e.g., pulmonary embolus, asthma, emphysema)     COPD 8. CAUSE: What do you think is causing the breathing problem?      copd 9. OTHER SYMPTOMS: Do you have any other symptoms? (e.g., chest pain, cough, dizziness, fever, runny nose)     cough 10. O2 SATURATION MONITOR:  Do you use an oxygen  saturation  monitor (pulse oximeter) at home? If Yes, ask: What is your reading (oxygen  level) today? What is your usual oxygen  saturation reading? (e.g., 95%)       82-84% [facility] today 11. PREGNANCY: Is there any chance you are pregnant? When was your last menstrual period?       no 12. TRAVEL: Have you traveled out of the country in the last month? (e.g., travel history, exposures)       no  Protocols used: Breathing Difficulty-A-AH  Reason for Disposition  [1] MODERATE longstanding difficulty breathing (e.g., speaks in phrases, SOB even at rest, pulse 100-120) AND [2] SAME as normal  Answer Assessment - Initial Assessment Questions 1. RESPIRATORY STATUS: Describe your breathing? (e.g., wheezing, shortness of breath, unable to speak, severe coughing)      SOB getting worse 2. ONSET: When did this breathing problem begin?      On going issue 3. PATTERN Does the difficult breathing come and go, or has it been constant since it started?      constant 4. SEVERITY: How bad is your breathing? (e.g., mild, moderate, severe)      moderate 5. RECURRENT SYMPTOM: Have you had difficulty breathing before? If Yes, ask: When was the last time? and What happened that time?      yes 6. CARDIAC HISTORY: Do you have any history of heart disease? (e.g., heart attack, angina, bypass surgery, angioplasty)  YES 7. LUNG HISTORY: Do you have any history of lung disease?  (e.g., pulmonary embolus, asthma, emphysema)     COPD 8. CAUSE: What do you think is causing the breathing problem?      copd 9. OTHER SYMPTOMS: Do you have any other symptoms? (e.g., chest pain, cough, dizziness, fever, runny nose)     cough 10. O2 SATURATION MONITOR:  Do you use an oxygen  saturation monitor (pulse oximeter) at home? If Yes, ask: What is your reading (oxygen  level) today? What is your usual oxygen  saturation reading? (e.g., 95%)       82-84% [facility] today 11.  PREGNANCY: Is there any chance you are pregnant? When was your last menstrual period?       no 12. TRAVEL: Have you traveled out of the country in the last month? (e.g., travel history, exposures)       no  Protocols used: Breathing Difficulty-A-AH

## 2024-09-25 NOTE — Telephone Encounter (Signed)
 Left message to call the office back to ask some questions for [employer].

## 2024-09-25 NOTE — Telephone Encounter (Signed)
 Called Patient she states she is on home oxygen  and her o2 stats is in the 90's now. Patient states cancel her appointment for 09/26/24 with us  due to she is going to Pul[facility]olgy to see Dr. Isadora.

## 2024-09-25 NOTE — Telephone Encounter (Signed)
Left another message to call the office back.

## 2024-09-26 ENCOUNTER — Ambulatory Visit: Admitting: Nurse Practitioner

## 2024-09-29 ENCOUNTER — Ambulatory Visit: Admitting: Student in an Organized Health Care Education/Training Program

## 2024-09-29 ENCOUNTER — Encounter: Payer: Self-pay | Admitting: Student in an Organized Health Care Education/Training Program

## 2024-09-29 VITALS — BP 110/68 | HR 98 | Temp 97.5°F | Ht 64.0 in | Wt 141.8 lb

## 2024-09-29 DIAGNOSIS — F17210 Nicotine dependence, cigarettes, uncomplicated: Secondary | ICD-10-CM | POA: Diagnosis not present

## 2024-09-29 DIAGNOSIS — J449 Chronic obstructive pulmonary disease, unspecified: Secondary | ICD-10-CM | POA: Diagnosis not present

## 2024-09-29 DIAGNOSIS — F17200 Nicotine dependence, unspecified, uncomplicated: Secondary | ICD-10-CM

## 2024-09-29 DIAGNOSIS — J9611 Chronic respiratory failure with hypoxia: Secondary | ICD-10-CM

## 2024-09-29 DIAGNOSIS — J42 Unspecified chronic bronchitis: Secondary | ICD-10-CM

## 2024-09-29 MED ORDER — FLUTICASONE-SALMETEROL 250-50 MCG/ACT IN AEPB: 1.0000 | INHALATION_SPRAY | Freq: Two times a day (BID) | RESPIRATORY_TRACT | 12 refills | Status: AC

## 2024-09-29 NOTE — Progress Notes (Signed)
 Assessment & Plan:   #COPD (GOLD 2B) #Chronic bronchitis  She has longstanding history of COPD secondary to her extensive smoking history as well as work in the [employer]. Chest CT from 2024 showed significant centrilobular emphysema. No PFT's on file but does have in office spirometry from 2019 showing FEV1/FVC of 0.58 and FEV1 of 1.44 L (55% predicted).  She unfortunately continues to smoke, and she did not enrol in pulmonary rehab. Discussed medications, she confused adding LAMA and stopped the ICS/LABA. I will restart ICS/LABA with Wixela, and she will be on full triple therapy (Wixela + Spiriva ). She will continue on Oxygen  therapy and we will re-order her PFT's.   I have personally reviewed her chest CT and note the severity of the emphysema in the upper lobes, more so in the RUL. Options for Ms. Hegler, should she quit smoking, would include endobronchial valve placement for EBLVR as well as consideration for lung transplantation. We will discuss these in the future.  - Pulmonary Function Test; Future - fluticasone -salmeterol (ADVAIR) 250-50 MCG/ACT AEPB; Inhale 1 puff into the lungs in the morning and at bedtime.  Dispense: 60 each; Refill: 12 - Continue Spiriva  Respimat - Continue Lung Cancer screening - Smoking cessation counseling given  #Pre-operative risk assessment  Scores 27 points on ARISCAT, with intermediate risk (13.3% of post op pulmonary complications). She will be optimized from a pulmonary perspective after the initiation of ICS/LABA. She is scheduled for a low risk cataract surgery, and after initiation of ICS/LABA and continuation of LAMA should be ok to proceed with surgery. She needs to quit smoking prior to surgical intervention.  #Tobacco Use Disorder  Again discussed importance of smoking cessation on her health. I did prescribe nicotine  replacement therapy during our prior visit. Patient will attempt to quit but expresses difficulty given  her husband is  a chain smoker.   Return in about 6 months (around 03/30/2025).  I spent 35 minutes caring for this patient today, including preparing to see the patient, obtaining a medical history , reviewing a separately obtained history, performing a medically appropriate examination and/or evaluation, counseling and educating the patient/family/caregiver, ordering medications, tests, or procedures, documenting clinical information in the electronic health record, and independently interpreting results (not separately reported/billed) and communicating results to the patient/family/caregiver. 4 minutes utilized during today's visit to counsel the patient regarding the importance and strategies of smoking cessation.  Belva November, MD [facility]   End of visit medications:  Meds ordered this encounter  Medications   fluticasone -salmeterol (ADVAIR) 250-50 MCG/ACT AEPB    Sig: Inhale 1 puff into the lungs in the morning and at bedtime.    Dispense:  60 each    Refill:  12     Current Outpatient Medications:    acyclovir  (ZOVIRAX ) 400 MG tablet, Take 1 tablet (400 mg total) by mouth daily., Disp: 90 tablet, Rfl: 3   ALPRAZolam  (XANAX ) 0.25 MG tablet, Take 1 tablet (0.25 mg total) by mouth 2 (two) times daily as needed. for anxiety, Disp: 60 tablet, Rfl: 2   aspirin  EC 81 MG tablet, Take 81 mg by mouth daily., Disp: , Rfl:    cholecalciferol (VITAMIN D ) 1000 units tablet, Take 1,000 Units by mouth daily., Disp: , Rfl:    cycloSPORINE (RESTASIS) 0.05 % ophthalmic emulsion, 1 drop 2 (two) times daily., Disp: , Rfl:    diltiazem  (CARDIZEM  CD) 180 MG 24 hr capsule, TAKE 1 CAPSULE BY MOUTH ONCE DAILY, Disp: 90 capsule, Rfl:  3   escitalopram  (LEXAPRO ) 20 MG tablet, Take 1 tablet (20 mg total) by mouth daily., Disp: 90 tablet, Rfl: 3   gabapentin  (NEURONTIN ) 100 MG capsule, Take 1 capsule (100 mg total) by mouth 3 (three) times daily., Disp: 270 capsule, Rfl: 3   HYDROcodone -acetaminophen   (NORCO/VICODIN) 5-325 MG tablet, Take 1 tablet by mouth every 4 (four) hours as needed for moderate pain (pain score 4-6) or severe pain (pain score 7-10)., Disp: 130 tablet, Rfl: 0   [START ON 10/11/2024] HYDROcodone -acetaminophen  (NORCO/VICODIN) 5-325 MG tablet, Take 1 tablet by mouth every 4 (four) hours as needed for moderate pain (pain score 4-6) or severe pain (pain score 7-10)., Disp: 130 tablet, Rfl: 0   isosorbide  mononitrate (IMDUR ) 30 MG 24 hr tablet, Take 1 tablet (30 mg total) by mouth daily., Disp: 90 tablet, Rfl: 3   lisinopril  (ZESTRIL ) 40 MG tablet, Take 1 tablet (40 mg total) by mouth daily., Disp: 90 tablet, Rfl: 4   Multiple Vitamins-Minerals (CENTRUM SILVER ULTRA WOMENS) TABS, Take 1 tablet by mouth daily., Disp: , Rfl:    nitroGLYCERIN  (NITROSTAT ) 0.4 MG SL tablet, Place 1 tablet (0.4 mg total) under the tongue every 5 (five) minutes as needed for chest pain., Disp: 25 tablet, Rfl: 3   ondansetron  (ZOFRAN -ODT) 4 MG disintegrating tablet, Take 1 tablet (4 mg total) by mouth every 6 (six) hours as needed for nausea or vomiting., Disp: 20 tablet, Rfl: 2   PROAIR  RESPICLICK 108 (90 Base) MCG/ACT AEPB, INHALE 1 PUFF BY MOUTH EVERY 6 HOURS AS NEEDED FOR COUGH, Disp: 1 each, Rfl: 0   rosuvastatin  (CRESTOR ) 40 MG tablet, Take 1 tablet (40 mg total) by mouth daily., Disp: 90 tablet, Rfl: 4   Tiotropium Bromide  Monohydrate (SPIRIVA  RESPIMAT) 2.5 MCG/ACT AERS, Inhale 2 puffs into the lungs daily., Disp: 60 each, Rfl: 12   Dimethyl Fumarate  (TECFIDERA ) 240 MG CPDR, Take 1 mg by mouth daily. (Patient not taking: Reported on 09/29/2024), Disp: , Rfl:    fluticasone -salmeterol (ADVAIR) 250-50 MCG/ACT AEPB, Inhale 1 puff into the lungs in the morning and at bedtime., Disp: 60 each, Rfl: 12   tiZANidine  (ZANAFLEX ) 4 MG tablet, Take 1 tablet (4 mg total) by mouth 3 (three) times daily. (Patient not taking: Reported on 09/29/2024), Disp: 90 tablet, Rfl: 3   Subjective:   PATIENT ID: Debra Hogan  GENDER: female DOB: [birth year], MRN: [id number]  Chief Complaint  Patient presents with   Medical Management of Chronic Issues    SOB, wheezing and cough.  Spiriva  helps with her breathing. Not using the Advair. Albuterol  as needed.     HPI  Patient is a pleasant 63 year old female presenting to clinic for the evaluation of shortness of breath and cough.  Initial Visit 12/12/2023:  Patient is presenting for the evaluation of chronic symptoms of shortness of breath and exertional dyspnea. Cough productive of scant sputum, but no hemoptysis. Denies chest tightness, but does report wheeze. Patient previously seen in our clinic in 2019 by Dr. Verdia for management of emphysema (FEV1 of 55% predicted at the time) for which she was started on ICS/LABA (currently on Wixela). Patient presents today on oxygen  via tank. Her most recent COPD exacerbation was in March of 2024, requiring a course of antibiotics and steroids. Patient is enrolled in our lung cancer cancer screening program and her last chest CT was in April of 2024.  Return Visit 09/29/2024:  She continues to have shortness of breath with exertion as well as wheezing.  No report of COPD exacerbations since our last visit. She confused inhalers and stopped the Wixela, but was taking the Spiriva . She continues to smoke, up to 1 pack of cigarettes a day. She is scheduled for cataract surgery in the future and needs pre-operative risk assessment.  Patient was seen by cardiology for chest pain/discomfort. She has a history of CAD, HTN, and pericardial effusion. She is managed medically. RHC from 2020 showed RA 12, RV (42/14), PA (38/18 - mean 27), PCWP 14.   Patient previously worked in the [employer] in [employer]. She reports a long standing history of smoking, and is currently actively smoking (1 ppd). She has at least 50 pack years of smoking history.   Ancillary information including prior medications, full  medical/surgical/family/social histories, and PFTs (when available) are listed below and have been reviewed.    Review of Systems  Constitutional:  Negative for chills, fever, malaise/fatigue and weight loss.  Respiratory:  Positive for cough, sputum production, shortness of breath and wheezing. Negative for hemoptysis.   Cardiovascular:  Negative for chest pain and palpitations.     Objective:   Vitals:   09/29/24 0916  BP: 110/68  Pulse: 98  Temp: (!) 97.5 F (36.4 C)  SpO2: 96%  Weight: 141 lb 12.8 oz (64.3 kg)  Height: 5' 4 (1.626 m)   96% on 2L [place]  BMI Readings from Last 3 Encounters:  09/29/24 24.34 kg/m  09/24/24 24.20 kg/m  08/27/24 24.48 kg/m   Wt Readings from Last 3 Encounters:  09/29/24 141 lb 12.8 oz (64.3 kg)  09/24/24 141 lb (64 kg)  08/27/24 142 lb 9.6 oz (64.7 kg)    Physical Exam Constitutional:      Appearance: Normal appearance.  Cardiovascular:     Rate and Rhythm: Normal rate and regular rhythm.  Pulmonary:     Effort: Pulmonary effort is normal.     Breath sounds: Wheezing present.  Neurological:     General: No focal deficit present.     Mental Status: She is alert and oriented to person, place, and time. Mental status is at baseline.     Ancillary Information    Past Medical History:  Diagnosis Date   Anginal pain    Anxiety    Arthritis    Asthma    CHF (congestive heart failure) (HCC)    Chronic kidney disease    COPD (chronic obstructive pulmonary disease) (HCC)    COPD, very severe (HCC)    Coronary artery disease    Depression    Dyspnea    emphasema   Fibromyalgia    pt has MS   Headache    Heat stroke 2006   Hypertension    Irritable bowel syndrome    MS (multiple sclerosis)    Followed by Dr. Maree   Myocardial infarction [facility])    On supplemental oxygen  therapy    Osteoarthritis of left shoulder 01/12/2022   Sleep apnea    not being treated for   Stroke (HCC)    heat strok 28 years ago     Family  History  Problem Relation Age of Onset   Hypertension Mother    Hyperlipidemia Mother    Arthritis Mother    Cancer Father        throat   Heart disease Brother    Bipolar disorder Son    Schizophrenia Son    Cancer Maternal Grandfather        liver   Cancer Paternal Grandmother  liver     Past Surgical History:  Procedure Laterality Date   ABDOMINAL HYSTERECTOMY     CERVIX intact; had for noncancerous reasons, endometriosis   CATARACT EXTRACTION W/PHACO Left 08/27/2024   Procedure: PHACOEMULSIFICATION, CATARACT, WITH IOL INSERTION 2.85, 00:19.0;  Surgeon: Mittie Gaskin, MD;  Location: [facility];  Service: Ophthalmology;  Laterality: Left;   CHOLECYSTECTOMY N/A 01/03/2016   Procedure: LAPAROSCOPIC CHOLECYSTECTOMY WITH INTRAOPERATIVE CHOLANGIOGRAM;  Surgeon: Charlie FORBES Fell, MD;  Location: [facility];  Service: General;  Laterality: N/A;   ENDOSCOPIC RETROGRADE CHOLANGIOPANCREATOGRAPHY (ERCP) WITH PROPOFOL  N/A 01/04/2016   Procedure: ENDOSCOPIC RETROGRADE CHOLANGIOPANCREATOGRAPHY (ERCP) WITH PROPOFOL ;  Surgeon: Rogelia Copping, MD;  Location: [facility];  Service: Endoscopy;  Laterality: N/A;   FOOT SURGERY  2001   LAPAROSCOPIC ENDOMETRIOSIS FULGURATION     RIGHT/LEFT HEART CATH AND CORONARY ANGIOGRAPHY Bilateral 09/29/2019   Procedure: RIGHT/LEFT HEART CATH AND CORONARY ANGIOGRAPHY;  Surgeon: Mady Bruckner, MD;  Location: [facility];  Service: Cardiovascular;  Laterality: Bilateral;    Social History   Socioeconomic History   Marital status: Married    Spouse name: Not on file   Number of children: Not on file   Years of education: Not on file   Highest education level: GED or equivalent  Occupational History   Not on file  Tobacco Use   Smoking status: Every Day    Current packs/day: 1.00    Average packs/day: 1 pack/day for 52.8 years (52.8 ttl pk-yrs)    Types: Cigarettes    Start date: 1973   Smokeless tobacco: Never   Tobacco  comments:    0.5 PPD- khj 09/29/2024  Vaping Use   Vaping status: Never Used  Substance and Sexual Activity   Alcohol use: No    Alcohol/week: 0.0 standard drinks of alcohol   Drug use: Yes    Types: Marijuana, Cocaine    Comment: 30 years ago   Sexual activity: Not Currently    Birth control/protection: Post-menopausal  Other Topics Concern   Not on file  Social History Narrative   Lives in [place]. Widowed x 2.      Velma Son died 2022-12-11 at 34 years of age.       Married for 14  years.       On disability      Social Drivers of Corporate investment banker Strain: Medium Risk (07/23/2024)   Overall Financial Resource Strain (CARDIA)    Difficulty of Paying Living Expenses: Somewhat hard  Food Insecurity: No Food Insecurity (07/23/2024)   Hunger Vital Sign    Worried About Running Out of Food in the Last Year: Never true    Ran Out of Food in the Last Year: Never true  Transportation Needs: No Transportation Needs (07/23/2024)   PRAPARE - Administrator, Civil Service (Medical): No    Lack of Transportation (Non-Medical): No  Physical Activity: Inactive (07/23/2024)   Exercise Vital Sign    Days of Exercise per Week: 0 days    Minutes of Exercise per Session: 0 min  Stress: Stress Concern Present (07/23/2024)   [employer] of Occupational Health - Occupational Stress Questionnaire    Feeling of Stress: To some extent  Social Connections: Socially Isolated (07/23/2024)   Social Connection and Isolation Panel    Frequency of Communication with Friends and Family: Once a week    Frequency of Social Gatherings with Friends and Family: Never    Attends Religious Services: Never  Active Member of Clubs or Organizations: No    Attends Banker Meetings: Not on file    Marital Status: Married  Intimate Partner Violence: Not At Risk (07/23/2024)   Humiliation, Afraid, Rape, and Kick questionnaire    Fear of Current or Ex-Partner: No     Emotionally Abused: No    Physically Abused: No    Sexually Abused: No     Allergies  Allergen Reactions   Valium [Diazepam] Anxiety     CBC    Component Value Date/Time   WBC 7.0 01/03/2024 1627   RBC 4.32 01/03/2024 1627   HGB 14.0 01/03/2024 1627   HGB 14.8 09/25/2019 1101   HCT 41.3 01/03/2024 1627   HCT 42.8 09/25/2019 1101   PLT 237.0 01/03/2024 1627   PLT 196 09/25/2019 1101   MCV 95.5 01/03/2024 1627   MCV 94 09/25/2019 1101   MCH 32.3 06/18/2020 1559   MCHC 33.9 01/03/2024 1627   RDW 12.6 01/03/2024 1627   RDW 12.2 09/25/2019 1101   LYMPHSABS 1.1 01/03/2024 1627   LYMPHSABS 0.5 (L) 09/25/2019 1101   MONOABS 0.6 01/03/2024 1627   EOSABS 0.1 01/03/2024 1627   EOSABS 0.1 09/25/2019 1101   BASOSABS 0.0 01/03/2024 1627   BASOSABS 0.0 09/25/2019 1101    Pulmonary Functions Testing Results:     No data to display          Outpatient Medications Prior to Visit  Medication Sig Dispense Refill   acyclovir  (ZOVIRAX ) 400 MG tablet Take 1 tablet (400 mg total) by mouth daily. 90 tablet 3   ALPRAZolam  (XANAX ) 0.25 MG tablet Take 1 tablet (0.25 mg total) by mouth 2 (two) times daily as needed. for anxiety 60 tablet 2   aspirin  EC 81 MG tablet Take 81 mg by mouth daily.     cholecalciferol (VITAMIN D ) 1000 units tablet Take 1,000 Units by mouth daily.     cycloSPORINE (RESTASIS) 0.05 % ophthalmic emulsion 1 drop 2 (two) times daily.     diltiazem  (CARDIZEM  CD) 180 MG 24 hr capsule TAKE 1 CAPSULE BY MOUTH ONCE DAILY 90 capsule 3   escitalopram  (LEXAPRO ) 20 MG tablet Take 1 tablet (20 mg total) by mouth daily. 90 tablet 3   gabapentin  (NEURONTIN ) 100 MG capsule Take 1 capsule (100 mg total) by mouth 3 (three) times daily. 270 capsule 3   HYDROcodone -acetaminophen  (NORCO/VICODIN) 5-325 MG tablet Take 1 tablet by mouth every 4 (four) hours as needed for moderate pain (pain score 4-6) or severe pain (pain score 7-10). 130 tablet 0   [START ON 10/11/2024]  HYDROcodone -acetaminophen  (NORCO/VICODIN) 5-325 MG tablet Take 1 tablet by mouth every 4 (four) hours as needed for moderate pain (pain score 4-6) or severe pain (pain score 7-10). 130 tablet 0   isosorbide  mononitrate (IMDUR ) 30 MG 24 hr tablet Take 1 tablet (30 mg total) by mouth daily. 90 tablet 3   lisinopril  (ZESTRIL ) 40 MG tablet Take 1 tablet (40 mg total) by mouth daily. 90 tablet 4   Multiple Vitamins-Minerals (CENTRUM SILVER ULTRA WOMENS) TABS Take 1 tablet by mouth daily.     nitroGLYCERIN  (NITROSTAT ) 0.4 MG SL tablet Place 1 tablet (0.4 mg total) under the tongue every 5 (five) minutes as needed for chest pain. 25 tablet 3   ondansetron  (ZOFRAN -ODT) 4 MG disintegrating tablet Take 1 tablet (4 mg total) by mouth every 6 (six) hours as needed for nausea or vomiting. 20 tablet 2   PROAIR  RESPICLICK 108 (90 Base) MCG/ACT  AEPB INHALE 1 PUFF BY MOUTH EVERY 6 HOURS AS NEEDED FOR COUGH 1 each 0   rosuvastatin  (CRESTOR ) 40 MG tablet Take 1 tablet (40 mg total) by mouth daily. 90 tablet 4   Tiotropium Bromide  Monohydrate (SPIRIVA  RESPIMAT) 2.5 MCG/ACT AERS Inhale 2 puffs into the lungs daily. 60 each 12   Dimethyl Fumarate  (TECFIDERA ) 240 MG CPDR Take 1 mg by mouth daily. (Patient not taking: Reported on 09/29/2024)     tiZANidine  (ZANAFLEX ) 4 MG tablet Take 1 tablet (4 mg total) by mouth 3 (three) times daily. (Patient not taking: Reported on 09/29/2024) 90 tablet 3   fluticasone -salmeterol (ADVAIR) 250-50 MCG/ACT AEPB Inhale 1 puff into the lungs in the morning and at bedtime. (Patient not taking: Reported on 09/29/2024)     No facility-administered medications prior to visit.

## 2024-09-29 NOTE — Patient Instructions (Signed)
 The [place]  Quitline: Call 1-800-QUIT-NOW (682 840 6469). The [place] Quitline is a free service for [place]  residents. Trained counselors are available from 8 am until 3 am, 365 days per year. Services are available in both [place] and [place].   Web Resources Free online support programs can help you track your progress and share experiences with others who are quitting. These are examples: www.becomeanex.org www.trytostop.org  www.smokefree.gov  www.https://www.vargas.com/.aspx  [facility]: offers comprehensive in-person tobacco treatment counseling at [facility] building ([address], [place] [address]).  Open to everyone. Virtual appointments available. Free parking. Call [phone] to schedule an appointment or [phone] for general information.    Tobacco Cessation Medications  Nicotine  Replacement Therapy (NRT)  Nicotine  is the addictive part of tobacco smoke, but not the most dangerous part. There are 7000 other toxins in cigarettes, including carbon monoxide, that cause disease. People do not generally become addicted to medication. Common problems: People don't use enough medication or stop too early. Medications are safe and effective. Overdose is very uncommon. Use medications as long as needed (3 months minimum). Some combinations work better than single medications. Long acting medications like the NRT patch and bupropion provide continuous treatment for withdrawal symptoms.  PLUS  Short acting medications like the NRT gum, lozenge, inhaler, and nasal spray help people to cope with breakthrough cravings.  ? Nicotine  Patch  Place patch on hairless skin on upper body, including arms and back. Each day: discard old patch, shower, apply new patch to a different site. Apply hydrocortisone cream to mildly red/irritated areas. Call provider if rash develops. If patch causes sleep disturbance, remove patch  at bedtime and replace each morning after shower. Side effects may include: skin irritation, headache, insomnia, abnormal/vivid dreams.  ? Nicotine  Gum  Chew gum slowly, park in cheek when peppery taste or tingling sensation begins (about 15-30 chews). When taste or tingling goes away, begin chewing again. Use until nicotine  is gone (taste or tingle does not return, usually 30 minutes). Park in different areas of mouth. Nicotine  is absorbed through the lining of the mouth. Use enough to control cravings, up to 24 pieces per day (if used alone). Avoid eating or drinking for 15 minutes before using and during use. Side effects may include: mouth/jaw soreness, hiccups, indigestion, hypersalivation.  If gum is not chewed correctly, additional side effects may include lightheadedness, nausea/vomiting, throat and mouth irritation.  ? Nicotine  Lozenge  Allow to dissolve slowly in mouth (20-30 minutes). Do not chew or swallow. Nicotine  release may cause a warm tingling sensation. Occasionally rotate to different areas of the mouth. Use enough to control cravings, up to 20 lozenges per day (if used alone). Avoid eating or drinking for 15 minutes before using and during use. Side effects may include: nausea, hiccups, cough, heartburn, headache, gas, insomnia.  ? Nicotine  Nasal Spray Use 1 spray in each nostril (1 dose) and tilt head back for 1 minute. Do not sniff, swallow, or inhale through nose.  Use at least 8 doses (1 spray in each nostril) , up to 40 doses per day (if used alone). To reduce nasal irritation, spray on cotton swab and insert into nose. Side effects may include: nasal and/or throat irritation (hot, peppery, or burning sensation), nasal irritation, tearing, sneezing, cough, headache.  ? Nicotine  Oral Inhaler (puffer) Inhale into the back of the throat or puff in short breaths. Do not inhale into the lungs.  Puff continuously for 20 minutes (about 80 puffs) until cartridge  is  empty. Change cartridge when it loses the "burning in throat" sensation (feels like air only). Open cartridges can be saved and used again within 24 hours. Use at least 6 and up to 16 cartridges per day (if used alone).  Avoid eating or drinking for 15 minutes before using and during use. Side effects may include: mouth and/or throat irritation, unpleasant taste, cough, nasal irritation, indigestion, hiccups, headache.  ? Chantix  (varenicline ) Days 1-3: Take one 0.5 mg white pill each morning for 3 days, one week before quit date. Days 4-7: Increase to one 0.5 mg white pill twice a day in morning and evening for 4 days.  On Day 8 (target quit date), increase to one 1 mg blue pill twice a day. Maintain this dose for a minimum of 3 months. Take with food and a full glass of water to reduce nausea. Be sure that the two doses are at least 8 hours apart, but try to take second dose early in the evening (i.e. 6 pm) to avoid sleep problems. Common side effects include: nausea, insomnia, headache, abnormal/vivid dreams. Tell your doctor if you have any history of psychiatric illness prior to starting Chantix .  STOP taking CHANTIX  and contact a healthcare provider immediately if you experience agitation, hostility, depressed mood, changes in thoughts or behavior that are not typical for you, thinking about or attempting suicide, allergic or skin reactions including swelling, rash, redness, or peeling of the skin.  For patients who have heart disease: Smoking is a major risk factor for cardiovascular disease, and Chantix  can help you quit smoking. Chantix  may be associated with a small, increased risk of certain heart events in patients who have heart disease. If you have any new or worsening symptoms of heart disease while taking Chantix , such as shortness of breath or trouble breathing, new or worsening chest pain, or new or worsening pain in your legs when walking, call your doctor or get emergency medical  help immediately.  ? Wellbutrin / Zyban (bupropion) Take one 150 mg pill each morning for 3 days, one week before target quit date. On Day 4, increase to one 150 mg pill twice a day, morning and evening.  Maintain this dose for a minimum of 3 months. Be sure that the two doses are at least 8 hours apart, but try to take second dose early in the evening (i.e. 6 pm) to avoid sleep problems. Avoid or minimize use of alcohol when taking this medication. Common side effects include: dry mouth, headache, insomnia, nausea, weight loss.  Risk of seizure is 12/998. STOP taking BUPROPION and contact a healthcare provider immediately if you experience agitation, hostility, depressed mood, changes in thoughts or behavior that are not typical for you, thinking about or attempting suicide, allergic or skin reactions including swelling, rash, redness, or peeling of the skin.

## 2024-10-01 ENCOUNTER — Ambulatory Visit: Admitting: Nurse Practitioner

## 2024-10-01 ENCOUNTER — Ambulatory Visit: Admitting: Family

## 2024-10-01 DIAGNOSIS — J449 Chronic obstructive pulmonary disease, unspecified: Secondary | ICD-10-CM

## 2024-10-02 ENCOUNTER — Ambulatory Visit: Attending: Nurse Practitioner | Admitting: Nurse Practitioner

## 2024-10-02 ENCOUNTER — Encounter: Payer: Self-pay | Admitting: Nurse Practitioner

## 2024-10-02 VITALS — BP 116/80 | HR 104 | Ht 65.0 in | Wt 143.8 lb

## 2024-10-02 DIAGNOSIS — E785 Hyperlipidemia, unspecified: Secondary | ICD-10-CM | POA: Diagnosis not present

## 2024-10-02 DIAGNOSIS — R072 Precordial pain: Secondary | ICD-10-CM | POA: Diagnosis not present

## 2024-10-02 DIAGNOSIS — J449 Chronic obstructive pulmonary disease, unspecified: Secondary | ICD-10-CM

## 2024-10-02 DIAGNOSIS — I272 Pulmonary hypertension, unspecified: Secondary | ICD-10-CM | POA: Diagnosis not present

## 2024-10-02 DIAGNOSIS — R002 Palpitations: Secondary | ICD-10-CM

## 2024-10-02 DIAGNOSIS — I201 Angina pectoris with documented spasm: Secondary | ICD-10-CM

## 2024-10-02 DIAGNOSIS — I25118 Atherosclerotic heart disease of native coronary artery with other forms of angina pectoris: Secondary | ICD-10-CM | POA: Diagnosis not present

## 2024-10-02 DIAGNOSIS — F17200 Nicotine dependence, unspecified, uncomplicated: Secondary | ICD-10-CM

## 2024-10-02 DIAGNOSIS — I25119 Atherosclerotic heart disease of native coronary artery with unspecified angina pectoris: Secondary | ICD-10-CM

## 2024-10-02 MED ORDER — ISOSORBIDE MONONITRATE ER 60 MG PO TB24: 60.0000 mg | ORAL_TABLET | Freq: Every day | ORAL | 3 refills | Status: AC

## 2024-10-02 NOTE — Progress Notes (Signed)
 Office Visit    Patient Name: Debra Hogan Date of Encounter: 10/02/2024  Primary Care Provider:  Dineen Rollene MATSU, FNP Primary Cardiologist:  Deatrice Cage, MD    Chief Complaint    63 y.o. female with a history of non-obstructive CAD, coronary vasospasm, fibromyalgia, multiple sclerosis, tobacco use, COPD on supplemental oxygen , degenerative disc disease, chronic back pain, hyperlipidemia, pulmonary hypertension, who presents for worsening of chest pain.  Past Medical History   Subjective   Past Medical History:  Diagnosis Date   Anginal pain    Anxiety    Arthritis    Asthma    Chronic heart failure with preserved ejection fraction (HFpEF) (HCC)    a. 10/2023 Echo: EF 65-70%, GrI DD; b. 05/2024 Echo: EF 60-65%, no rwma, nl RV size/fxn, small circumferential pericardial effusion.  No significant valvular disease.   COPD, very severe (HCC)    Coronary artery disease - non-obstructive    a. 09/2019 Cath: LM nl, LAD nl, LCX nl, RCA 40p (vasospasm noted ostial RCA). EF 65%-->ccb started.   Coronary vasospasm    a. 09/2019 Cath: ost RCA vasospasm-->ccb started.   Depression    Dyspnea    emphasema   Fibromyalgia    Headache    Heat stroke 2006   Hypertension    Irritable bowel syndrome    MS (multiple sclerosis)    Followed by Dr. Maree   Myocardial infarction [facility])    On supplemental oxygen  therapy    Osteoarthritis of left shoulder 01/12/2022   PAH (pulmonary artery hypertension) (HCC)    a. 09/2019 RHC: RA 12, RV 42/14, PA 38/18 (27), PCWP 14. CO 4.6 L/min.   Pericardial effusion    a. 10/2023 Echo: small circumferential pericardial effusion without tamponade; b.  05/2024 Echo: Small circumferential pericardial effusion 1 cm of the RV and LV free wall.   Sleep apnea (untreated)    Tobacco abuse    Past Surgical History:  Procedure Laterality Date   ABDOMINAL HYSTERECTOMY     CERVIX intact; had for noncancerous reasons, endometriosis   CATARACT EXTRACTION  W/PHACO Left 08/27/2024   Procedure: PHACOEMULSIFICATION, CATARACT, WITH IOL INSERTION 2.85, 00:19.0;  Surgeon: Mittie Gaskin, MD;  Location: [facility];  Service: Ophthalmology;  Laterality: Left;   CHOLECYSTECTOMY N/A 01/03/2016   Procedure: LAPAROSCOPIC CHOLECYSTECTOMY WITH INTRAOPERATIVE CHOLANGIOGRAM;  Surgeon: Charlie FORBES Fell, MD;  Location: [facility];  Service: General;  Laterality: N/A;   ENDOSCOPIC RETROGRADE CHOLANGIOPANCREATOGRAPHY (ERCP) WITH PROPOFOL  N/A 01/04/2016   Procedure: ENDOSCOPIC RETROGRADE CHOLANGIOPANCREATOGRAPHY (ERCP) WITH PROPOFOL ;  Surgeon: Rogelia Copping, MD;  Location: [facility];  Service: Endoscopy;  Laterality: N/A;   FOOT SURGERY  2001   LAPAROSCOPIC ENDOMETRIOSIS FULGURATION     RIGHT/LEFT HEART CATH AND CORONARY ANGIOGRAPHY Bilateral 09/29/2019   Procedure: RIGHT/LEFT HEART CATH AND CORONARY ANGIOGRAPHY;  Surgeon: Mady Bruckner, MD;  Location: [facility];  Service: Cardiovascular;  Laterality: Bilateral;    Allergies  Allergies  Allergen Reactions   Valium [Diazepam] Anxiety       History of Present Illness      63 y.o. y/o female with a history of non-obstructive CAD, coronary vasospasm, fibromyalgia, multiple sclerosis, tobacco use, COPD on supplemental oxygen , degenerative disc disease, chronic back pain, hyperlipidemia, pulmonary hypertension.  She has family history of CAD.  She has a known small pericardial effusion that was first noted on CT scan of the lungs in [place] 2019.  Due to ongoing chest pain she underwent left heart catheterization in  09/2019, which showed mild (40%) proximal RCA stenosis with catheter induced spasm that improved with nitroglycerin .  No other obstructive disease was noted.  Right heart catheterization showed mild pulmonary hypertension with moderately elevated RA pressure.  Patient had some improvement in symptoms after being placed on diltiazem  for blood pressure and presumed spasm.  Myocardial  perfusion stress test in 05/2023 showed no significant ischemia or coronary artery calcification; aortic atherosclerosis and small pericardial effusion were noted. Echo in 05/2024 showed normal pumping function with LVEF of 60%-65%, no RWMA, no valvular abnormalities, and small pericardial effusion with no changes in size when compared to prior imaging in 2024.   Ms. Iseman presents for worsening of chest pain that she has had chronically for years.  Pain has become more frequent over the past 2 weeks, feels like a pressure in the chest, sometimes radiates into right arm, typically occurs with activity such as walking from the bedroom to the kitchen, worsens when lying flat or on her left side, and is made better by sitting up, lying on right side, or use of nitroglycerin .  Currently using nitroglycerin  several times a week (historically uses about once a month). Pain does rarely occur at rest, when patient is engaged in heavy conversation.  Pain is also sometimes reproducible w/ palpation over her precordium and under her left breast.  Chest wall tenderness is similar in character to exertional symptoms, though not exactly the same thing.  All symptoms have been present to some degree for > 5 years.  She also has chronic DOE, which worsened recently but has slightly improved since seeing pulmonology earlier this month with adjustment to her inhaler therapy.  She cont to smoke at least 1/2 ppd and when not smoking, wears O2 @ 3lpm.  With exertion, she becomes dyspneic after just a few feet (bedroom to couch) and this is often associated w/ elevations in HR and dizziness.  All symptoms resolve w/in a minute or so of rest.  She does not experience dyspnea or elevated HRs @ rest.  She denies pnd, orthopnea, n, v, syncope, edema, weight gain, or early satiety. Patient is on 3 L oxygen  24/7 except when smoking.  Patient previously smoked 1 pack/day, has decreased to 1/2 pack/day.  Objective   Home Medications     Current Outpatient Medications  Medication Sig Dispense Refill   acyclovir  (ZOVIRAX ) 400 MG tablet Take 1 tablet (400 mg total) by mouth daily. 90 tablet 3   ALPRAZolam  (XANAX ) 0.25 MG tablet Take 1 tablet (0.25 mg total) by mouth 2 (two) times daily as needed. for anxiety 60 tablet 2   aspirin  EC 81 MG tablet Take 81 mg by mouth daily.     cholecalciferol (VITAMIN D ) 1000 units tablet Take 1,000 Units by mouth daily.     cycloSPORINE (RESTASIS) 0.05 % ophthalmic emulsion 1 drop 2 (two) times daily.     diltiazem  (CARDIZEM  CD) 180 MG 24 hr capsule TAKE 1 CAPSULE BY MOUTH ONCE DAILY 90 capsule 3   escitalopram  (LEXAPRO ) 20 MG tablet Take 1 tablet (20 mg total) by mouth daily. 90 tablet 3   fluticasone -salmeterol (ADVAIR) 250-50 MCG/ACT AEPB Inhale 1 puff into the lungs in the morning and at bedtime. 60 each 12   gabapentin  (NEURONTIN ) 100 MG capsule Take 1 capsule (100 mg total) by mouth 3 (three) times daily. 270 capsule 3   HYDROcodone -acetaminophen  (NORCO/VICODIN) 5-325 MG tablet Take 1 tablet by mouth every 4 (four) hours as needed for moderate pain (pain score  4-6) or severe pain (pain score 7-10). 130 tablet 0   [START ON 10/11/2024] HYDROcodone -acetaminophen  (NORCO/VICODIN) 5-325 MG tablet Take 1 tablet by mouth every 4 (four) hours as needed for moderate pain (pain score 4-6) or severe pain (pain score 7-10). 130 tablet 0   lisinopril  (ZESTRIL ) 40 MG tablet Take 1 tablet (40 mg total) by mouth daily. 90 tablet 4   Multiple Vitamins-Minerals (CENTRUM SILVER ULTRA WOMENS) TABS Take 1 tablet by mouth daily.     nitroGLYCERIN  (NITROSTAT ) 0.4 MG SL tablet Place 1 tablet (0.4 mg total) under the tongue every 5 (five) minutes as needed for chest pain. 25 tablet 3   ondansetron  (ZOFRAN -ODT) 4 MG disintegrating tablet Take 1 tablet (4 mg total) by mouth every 6 (six) hours as needed for nausea or vomiting. 20 tablet 2   PROAIR  RESPICLICK 108 (90 Base) MCG/ACT AEPB INHALE 1 PUFF BY MOUTH EVERY 6  HOURS AS NEEDED FOR COUGH 1 each 0   rosuvastatin  (CRESTOR ) 40 MG tablet Take 1 tablet (40 mg total) by mouth daily. 90 tablet 4   Tiotropium Bromide  Monohydrate (SPIRIVA  RESPIMAT) 2.5 MCG/ACT AERS Inhale 2 puffs into the lungs daily. 60 each 12   Dimethyl Fumarate  (TECFIDERA ) 240 MG CPDR Take 1 mg by mouth daily. (Patient not taking: Reported on 10/02/2024)     isosorbide  mononitrate (IMDUR ) 60 MG 24 hr tablet Take 1 tablet (60 mg total) by mouth daily. 90 tablet 3   No current facility-administered medications for this visit.     Physical Exam    VS:  BP 116/80   Pulse (!) 104   Ht 5' 5 (1.651 m)   Wt 143 lb 12.8 oz (65.2 kg)   SpO2 97%   BMI 23.93 kg/m  , BMI Body mass index is 23.93 kg/m.          GEN: Well nourished, well developed, in no acute distress. HEENT: normal. Neck: Supple, no JVD, carotid bruits, or masses. Cardiac: RRR, no murmurs, rubs, or gallops. No clubbing, cyanosis, edema.  Radials 2+/PT 2+ and equal bilaterally.  Respiratory: On 3 L oxygen  via nasal cannula.  Respirations regular and unlabored, diminished lung sounds with minimal anterior wheezing bilaterally. GI: Soft, nontender, nondistended, BS + x 4. MS: no deformity or atrophy.  Tenderness to palpation throughout precordium/left chest. Skin: warm and dry, no rash. Neuro:  Strength and sensation are intact. Psych: Normal affect.  Accessory Clinical Findings    ECG personally reviewed by me today - EKG Interpretation Date/Time:  Thursday October 02 2024 08:15:55 EDT Ventricular Rate:  104 PR Interval:  138 QRS Duration:  76 QT Interval:  320 QTC Calculation: 420 R Axis:   74  Text Interpretation: Sinus tachycardia Low voltage QRS Confirmed by Vivienne Bruckner [phone]) on 10/02/2024 8:20:08 AM  Sinus tachycardia at rate of 104, low voltage QRS, nonspecific T wave abnormalities- no acute changes.  Lab Results  Component Value Date   WBC 7.0 01/03/2024   HGB 14.0 01/03/2024   HCT 41.3  01/03/2024   MCV 95.5 01/03/2024   PLT 237.0 01/03/2024   Lab Results  Component Value Date   CREATININE 0.80 05/15/2024   BUN 13 05/15/2024   NA 143 05/15/2024   K 4.0 05/15/2024   CL 98 05/15/2024   CO2 37 (H) 05/15/2024   Lab Results  Component Value Date   ALT 11 05/15/2024   AST 20 05/15/2024   ALKPHOS 69 05/15/2024   BILITOT 0.3 05/15/2024   Lab Results  Component Value Date   CHOL 201 (H) 04/29/2024   HDL 48.90 04/29/2024   LDLCALC 112 (H) 04/29/2024   LDLDIRECT 86.0 04/05/2022   TRIG 201.0 (H) 04/29/2024   CHOLHDL 4 04/29/2024    Lab Results  Component Value Date   HGBA1C 5.4 04/05/2022   Lab Results  Component Value Date   TSH 3.28 01/03/2024       Assessment & Plan    1.  Precordial pain/CAD/Coronary vasospasm -Long h/o chest pain w/ typical and atypical featured.   -Prior w/u includes cath in 09/2019 showing nonobs prox RCA dzs and question of cor vasospasm, managed w/ CCB and nitrates, and more recently low risk myoview  in 05/2023. -Typically notes exertional c/p most days of the week but only requires nitrates ~ once/month but pain has become more frequent over the past 2 weeks.  Predominantly w/ activity and assoc w/ dyspnea and elevated HR, but can also occur when lying flat or on their left side.  Sometime made better by sitting up, lying on right side, or use of nitroglycerin .   -Symptoms overall consistent w/ prior c/p over the past 5 years, just more frequent recently. -Currently on Dilt 180 daily and Imdur  30 mg daily which patient reports taking as prescribed -Will increase of Imdur  from 30 mg to 60 mg  - Pending response to med rx, can consider repeat ischemic eval - MV vs coronary CTA - Educated on signs and symptoms that should prompt emergent care  2.  DOE/COPD/Tobacco abuse -Chronic DOE in the setting of COPD and ongoing tob abuse -Still smoking -Recently saw pulm w/ adjustment to inhalers and some improvement in DOE over the past few  days -? Anginal equivalent in setting of above -Increasing nitrate and will consider repeat ischemic testing -Tobacco cessation advised  3.  Palpitations -Daily episodes of worsening shortness of breath with exertion accompanied by dizziness and elevated HRs into the low 120's, which they have been dealing with for years -Symptoms resolve w/ rest -history of baseline elevated HRs/sinus tachycardia - Suspect this is due to sinus tachycardia in the setting of exertion and increased oxygen  demand/COPD -Discussed likely etiology with patient and potential role of 3 day zio monitor.  We collectively agreed to deferr at this time  3. Hyperlipidemia - LDL of 112 in 04/2024 -PCP increased patient rosuvastatin  from 20 mg to 40 mg - Has not had repeat lab work since (says she's missed appointments).  Patient fasting this morning, will order lipid panel and LFTs. -Discussed with patient that their target LDL is less than 70 based off risk factors and further medical therapy Tabares be needed based off today's lab work  4.  Hypertension -Managed with lisinopril , nitrate, and diltiazem  - Well-controlled  5.  Mild pericardial effusion -present dating back to 2019.  -Echo in 05/2024 showed normal LVEF of 60%-65%, no RWMA, no valvular abnormalities, and small pericardial effusion with no changes in size when compared to prior imaging in 2024.  6.  Pulmonary HTN -RHC in 2020 - PA pressure 38/18 (27). -No significant PAH reported on echoes in past year.  7.  Disposition -Follow-up in 1 month or sooner if needed   Lonni Meager, NP 10/02/2024, 11:53 AM

## 2024-10-02 NOTE — Patient Instructions (Signed)
 Medication Instructions:  Your physician recommends the following medication changes.  INCREASE: Imdur  to 60 mg by mouth daily    *If you need a refill on your cardiac medications before your next appointment, please call your pharmacy*  Lab Work: Your provider would like for you to have following labs drawn today Lipid, LFT.     Testing/Procedures: No test ordered today   Follow-Up: At [facility], you and your health needs are our priority.  As part of our continuing mission to provide you with exceptional heart care, our providers are all part of one team.  This team includes your primary Cardiologist (physician) and Advanced Practice Providers or APPs (Physician Assistants and Nurse Practitioners) who all work together to provide you with the care you need, when you need it.  Your next appointment:   1 month(s)  Provider:   Deatrice Cage, MD or Lonni Meager, NP

## 2024-10-03 ENCOUNTER — Ambulatory Visit: Payer: Self-pay | Admitting: Nurse Practitioner

## 2024-10-03 DIAGNOSIS — J449 Chronic obstructive pulmonary disease, unspecified: Secondary | ICD-10-CM | POA: Diagnosis not present

## 2024-10-03 DIAGNOSIS — G4733 Obstructive sleep apnea (adult) (pediatric): Secondary | ICD-10-CM | POA: Diagnosis not present

## 2024-10-03 DIAGNOSIS — I671 Cerebral aneurysm, nonruptured: Secondary | ICD-10-CM | POA: Diagnosis not present

## 2024-10-03 LAB — LIPID PANEL
Chol/HDL Ratio: 2.4 ratio (ref 0.0–4.4)
Cholesterol, Total: 131 mg/dL (ref 100–199)
HDL: 55 mg/dL (ref >39)
LDL Chol Calc (NIH): 50 mg/dL (ref 0–99)
Triglycerides: 157 mg/dL — ABNORMAL HIGH (ref 0–149)
VLDL Cholesterol Cal: 26 mg/dL (ref 5–40)

## 2024-10-03 LAB — HEPATIC FUNCTION PANEL
ALT: 10 IU/L (ref 0–32)
AST: 18 IU/L (ref 0–40)
Albumin: 4.2 g/dL (ref 3.9–4.9)
Alkaline Phosphatase: 64 IU/L (ref 49–135)
Bilirubin Total: 0.2 mg/dL (ref 0.0–1.2)
Bilirubin, Direct: <0.08 mg/dL (ref 0.00–0.40)
Total Protein: 6.4 g/dL (ref 6.0–8.5)

## 2024-10-08 ENCOUNTER — Encounter: Payer: Self-pay | Admitting: Anesthesiology

## 2024-10-08 ENCOUNTER — Ambulatory Visit: Attending: Anesthesiology | Admitting: Anesthesiology

## 2024-10-08 DIAGNOSIS — G8929 Other chronic pain: Secondary | ICD-10-CM

## 2024-10-08 DIAGNOSIS — M5441 Lumbago with sciatica, right side: Secondary | ICD-10-CM

## 2024-10-08 DIAGNOSIS — M5442 Lumbago with sciatica, left side: Secondary | ICD-10-CM | POA: Diagnosis not present

## 2024-10-08 DIAGNOSIS — M47816 Spondylosis without myelopathy or radiculopathy, lumbar region: Secondary | ICD-10-CM

## 2024-10-08 DIAGNOSIS — M19012 Primary osteoarthritis, left shoulder: Secondary | ICD-10-CM | POA: Diagnosis not present

## 2024-10-08 DIAGNOSIS — Z79891 Long term (current) use of opiate analgesic: Secondary | ICD-10-CM | POA: Diagnosis not present

## 2024-10-08 DIAGNOSIS — M47812 Spondylosis without myelopathy or radiculopathy, cervical region: Secondary | ICD-10-CM | POA: Diagnosis not present

## 2024-10-08 DIAGNOSIS — F1190 Opioid use, unspecified, uncomplicated: Secondary | ICD-10-CM

## 2024-10-08 DIAGNOSIS — G894 Chronic pain syndrome: Secondary | ICD-10-CM

## 2024-10-08 DIAGNOSIS — M5136 Other intervertebral disc degeneration, lumbar region with discogenic back pain only: Secondary | ICD-10-CM | POA: Diagnosis not present

## 2024-10-08 MED ORDER — HYDROCODONE-ACETAMINOPHEN 5-325 MG PO TABS: 1.0000 | ORAL_TABLET | ORAL | 0 refills | Status: AC | PRN

## 2024-10-08 NOTE — Progress Notes (Unsigned)
 Virtual Visit via Telephone Note  I connected with Debra Hogan on 10/08/24 at  9:00 AM EDT by telephone and verified that I am speaking with the correct person using two identifiers.  Location: Patient: Home Provider: Pain control center   I discussed the limitations, risks, security and privacy concerns of performing an evaluation and management service by telephone and the availability of in person appointments. I also discussed with the patient that there Gregori be a patient responsible charge related to this service. The patient expressed understanding and agreed to proceed.   History of Present Illness: I spoke with Debra Hogan via telephone for our video conference.  She was unable to do the video portion of this.  She reports that her medications continue to work well and keep her low back pain and hip and leg pain under good control.  She has failed more conservative care and continues to derive good functional lifestyle improvement with her opioid medications and no side effects are reported.  She takes her medications about every 4-6 hours getting about 80% relief lasting 4 to 6 hours before she has recurrence of her same baseline pain.  They enable her to stay active do her stretching strengthening exercises and fulfill her daily activities.  Enable her to sleep better at night.  No change in lower extremity strength function bowel or bladder function is noted.  The quality characteristic and distribution of the pain that she does report has been stable.  Review of systems: General: No fevers or chills Pulmonary: No shortness of breath or dyspnea Cardiac: No angina or palpitations or lightheadedness GI: No abdominal pain or constipation Psych: No depression    Observations/Objective:  Current Outpatient Medications:    acyclovir  (ZOVIRAX ) 400 MG tablet, Take 1 tablet (400 mg total) by mouth daily., Disp: 90 tablet, Rfl: 3   ALPRAZolam  (XANAX ) 0.25 MG tablet, Take 1 tablet (0.25 mg  total) by mouth 2 (two) times daily as needed. for anxiety, Disp: 60 tablet, Rfl: 2   aspirin  EC 81 MG tablet, Take 81 mg by mouth daily., Disp: , Rfl:    cholecalciferol (VITAMIN D ) 1000 units tablet, Take 1,000 Units by mouth daily., Disp: , Rfl:    cycloSPORINE (RESTASIS) 0.05 % ophthalmic emulsion, 1 drop 2 (two) times daily., Disp: , Rfl:    diltiazem  (CARDIZEM  CD) 180 MG 24 hr capsule, TAKE 1 CAPSULE BY MOUTH ONCE DAILY, Disp: 90 capsule, Rfl: 3   Dimethyl Fumarate  (TECFIDERA ) 240 MG CPDR, Take 1 mg by mouth daily. (Patient not taking: Reported on 10/02/2024), Disp: , Rfl:    escitalopram  (LEXAPRO ) 20 MG tablet, Take 1 tablet (20 mg total) by mouth daily., Disp: 90 tablet, Rfl: 3   fluticasone -salmeterol (ADVAIR) 250-50 MCG/ACT AEPB, Inhale 1 puff into the lungs in the morning and at bedtime., Disp: 60 each, Rfl: 12   gabapentin  (NEURONTIN ) 100 MG capsule, Take 1 capsule (100 mg total) by mouth 3 (three) times daily., Disp: 270 capsule, Rfl: 3   [START ON 10/11/2024] HYDROcodone -acetaminophen  (NORCO/VICODIN) 5-325 MG tablet, Take 1 tablet by mouth every 4 (four) hours as needed for moderate pain (pain score 4-6) or severe pain (pain score 7-10)., Disp: 130 tablet, Rfl: 0   [START ON 11/10/2024] HYDROcodone -acetaminophen  (NORCO/VICODIN) 5-325 MG tablet, Take 1 tablet by mouth every 4 (four) hours as needed for moderate pain (pain score 4-6) or severe pain (pain score 7-10)., Disp: 130 tablet, Rfl: 0   isosorbide  mononitrate (IMDUR ) 60 MG 24 hr tablet, Take  1 tablet (60 mg total) by mouth daily., Disp: 90 tablet, Rfl: 3   lisinopril  (ZESTRIL ) 40 MG tablet, Take 1 tablet (40 mg total) by mouth daily., Disp: 90 tablet, Rfl: 4   Multiple Vitamins-Minerals (CENTRUM SILVER ULTRA WOMENS) TABS, Take 1 tablet by mouth daily., Disp: , Rfl:    nitroGLYCERIN  (NITROSTAT ) 0.4 MG SL tablet, Place 1 tablet (0.4 mg total) under the tongue every 5 (five) minutes as needed for chest pain., Disp: 25 tablet, Rfl: 3    ondansetron  (ZOFRAN -ODT) 4 MG disintegrating tablet, Take 1 tablet (4 mg total) by mouth every 6 (six) hours as needed for nausea or vomiting., Disp: 20 tablet, Rfl: 2   PROAIR  RESPICLICK 108 (90 Base) MCG/ACT AEPB, INHALE 1 PUFF BY MOUTH EVERY 6 HOURS AS NEEDED FOR COUGH, Disp: 1 each, Rfl: 0   rosuvastatin  (CRESTOR ) 40 MG tablet, Take 1 tablet (40 mg total) by mouth daily., Disp: 90 tablet, Rfl: 4   Tiotropium Bromide  Monohydrate (SPIRIVA  RESPIMAT) 2.5 MCG/ACT AERS, Inhale 2 puffs into the lungs daily., Disp: 60 each, Rfl: 12   Past Medical History:  Diagnosis Date   Anginal pain    Anxiety    Arthritis    Asthma    Chronic heart failure with preserved ejection fraction (HFpEF) (HCC)    a. 10/2023 Echo: EF 65-70%, GrI DD; b. 05/2024 Echo: EF 60-65%, no rwma, nl RV size/fxn, small circumferential pericardial effusion.  No significant valvular disease.   COPD, very severe (HCC)    Coronary artery disease - non-obstructive    a. 09/2019 Cath: LM nl, LAD nl, LCX nl, RCA 40p (vasospasm noted ostial RCA). EF 65%-->ccb started.   Coronary vasospasm    a. 09/2019 Cath: ost RCA vasospasm-->ccb started.   Depression    Dyspnea    emphasema   Fibromyalgia    Headache    Heat stroke 2006   Hypertension    Irritable bowel syndrome    MS (multiple sclerosis)    Followed by Dr. Maree   Myocardial infarction [facility])    On supplemental oxygen  therapy    Osteoarthritis of left shoulder 01/12/2022   PAH (pulmonary artery hypertension) (HCC)    a. 09/2019 RHC: RA 12, RV 42/14, PA 38/18 (27), PCWP 14. CO 4.6 L/min.   Pericardial effusion    a. 10/2023 Echo: small circumferential pericardial effusion without tamponade; b.  05/2024 Echo: Small circumferential pericardial effusion 1 cm of the RV and LV free wall.   Sleep apnea (untreated)    Tobacco abuse    Assessment and Plan:  1. Chronic bilateral low back pain with bilateral sciatica   2. Chronic pain syndrome   3. Chronic, continuous use of  opioids   4. Lumbar facet joint syndrome   5. Primary osteoarthritis of left shoulder   6. Facet arthropathy, cervical   7. Degeneration of intervertebral disc of lumbar region with discogenic back pain    Based on our conversation today and after review of the [place]  practitioner database information I think is appropriate to refill her medicines for the next 2 months as she continues to derive good functional lifestyle improvement with the opioid medications.  She is doing well with her regimen.  She has failed more conservative therapy and is now more productive and comfortable with her medications.  No side effects reported.  Refills will be for mid October and mid November with her return to clinic in 2 months.  I encouraged her to continue with stretching strengthening  exercises as reviewed.  Continue follow-up with her primary care physicians for baseline medical care. Follow Up Instructions:    I discussed the assessment and treatment plan with the patient. The patient was provided an opportunity to ask questions and all were answered. The patient agreed with the plan and demonstrated an understanding of the instructions.   The patient was advised to call back or seek an in-person evaluation if the symptoms worsen or if the condition fails to improve as anticipated.  I provided 30 minutes of non-face-to-face time during this encounter.   Lynwood KANDICE Clause, MD

## 2024-10-14 ENCOUNTER — Other Ambulatory Visit: Payer: Self-pay | Admitting: Family

## 2024-10-14 DIAGNOSIS — F419 Anxiety disorder, unspecified: Secondary | ICD-10-CM

## 2024-10-14 DIAGNOSIS — F4320 Adjustment disorder, unspecified: Secondary | ICD-10-CM

## 2024-10-22 ENCOUNTER — Ambulatory Visit: Payer: Self-pay | Admitting: Family

## 2024-10-22 ENCOUNTER — Ambulatory Visit (INDEPENDENT_AMBULATORY_CARE_PROVIDER_SITE_OTHER): Admitting: Family

## 2024-10-22 VITALS — BP 136/78 | HR 104 | Temp 98.3°F | Ht 65.0 in | Wt 135.0 lb

## 2024-10-22 DIAGNOSIS — R809 Proteinuria, unspecified: Secondary | ICD-10-CM

## 2024-10-22 DIAGNOSIS — F419 Anxiety disorder, unspecified: Secondary | ICD-10-CM | POA: Diagnosis not present

## 2024-10-22 DIAGNOSIS — M6740 Ganglion, unspecified site: Secondary | ICD-10-CM | POA: Diagnosis not present

## 2024-10-22 DIAGNOSIS — R079 Chest pain, unspecified: Secondary | ICD-10-CM

## 2024-10-22 DIAGNOSIS — Z23 Encounter for immunization: Secondary | ICD-10-CM | POA: Diagnosis not present

## 2024-10-22 DIAGNOSIS — Z780 Asymptomatic menopausal state: Secondary | ICD-10-CM

## 2024-10-22 DIAGNOSIS — I10 Essential (primary) hypertension: Secondary | ICD-10-CM

## 2024-10-22 DIAGNOSIS — Z1211 Encounter for screening for malignant neoplasm of colon: Secondary | ICD-10-CM

## 2024-10-22 DIAGNOSIS — E785 Hyperlipidemia, unspecified: Secondary | ICD-10-CM | POA: Diagnosis not present

## 2024-10-22 DIAGNOSIS — F32A Depression, unspecified: Secondary | ICD-10-CM

## 2024-10-22 LAB — MICROALBUMIN / CREATININE URINE RATIO
Creatinine,U: 271.6 mg/dL
Microalb Creat Ratio: 234.6 mg/g — ABNORMAL HIGH (ref 0.0–30.0)
Microalb, Ur: 63.7 mg/dL — ABNORMAL HIGH (ref 0.0–1.9)

## 2024-10-22 NOTE — Assessment & Plan Note (Signed)
 Chronic, excellent control. Continue crestor  20mg .

## 2024-10-22 NOTE — Assessment & Plan Note (Signed)
 Fortunately resolved with increase of Imdur . Will follow.

## 2024-10-22 NOTE — Assessment & Plan Note (Signed)
 Suspected ganglion cyst. No symptoms of infection. Offered Xray and US  ; she declines and prefers to call Dr Cleotilde to schedule follow up. Will follow.

## 2024-10-22 NOTE — Patient Instructions (Addendum)
 You Rudder have shingrex vaccine at local pharmacy  Please follow up with Dr Cleotilde, [facility] for cysts of wrist.    And please consider follow [facility] neurology   Referral for colonoscopy  Let us  know if you dont hear back within 2 weeks in regards to an appointment being scheduled.   So that you are aware, if you are [facility] MyChart user , please pay attention to your MyChart messages as you [place] receive a MyChart message with a phone number to call and schedule this test/appointment own your own from our referral coordinator. This is a new process so I do not want you to miss this message.  If you are not a MyChart user, you will receive a phone call.    Please call  and schedule your 3D mammogram and /or bone density scan as we discussed.   [facility]  ( new location in 2023)  [address], [place], [place] [address]  [place], [place]  [phone]

## 2024-10-22 NOTE — Assessment & Plan Note (Addendum)
 Chronic, stable.  She declines transitioning from lexapro  to cymbalta  to aid in chronic pain , GAD  Discussed risks of BZDs and my concern with long term use of xanax  0.25mg .  Will discuss again in 3 months tome.  Continue lexapro  20mg  for now.

## 2024-10-22 NOTE — Progress Notes (Signed)
 Assessment & Plan:  Primary hypertension  Postmenopausal estrogen deficiency -     HM DEXA SCAN  Screening for colon cancer -     Ambulatory referral to Gastroenterology  Anxiety and depression Assessment & Plan: Chronic, stable.  She declines transitioning from lexapro  to cymbalta  to aid in chronic pain , GAD  Discussed risks of BZDs and my concern with long term use of xanax  0.25mg .  Will discuss again in 3 months tome.  Continue lexapro  20mg  for now.    Proteinuria, unspecified type -     Microalbumin / creatinine urine ratio  Need for pneumococcal 20-valent conjugate vaccination -     Pneumococcal conjugate vaccine 20-valent  Need for influenza vaccination -     Flu vaccine HIGH DOSE PF(Fluzone Trivalent)  Ganglion cyst Assessment & Plan: Suspected ganglion cyst. No symptoms of infection. Offered Xray and US  ; she declines and prefers to call Dr Cleotilde to schedule follow up. Will follow.    Chest pain, unspecified type Assessment & Plan: Fortunately resolved with increase of Imdur . Will follow.    Hyperlipidemia, unspecified hyperlipidemia type Assessment & Plan: Chronic, excellent control. Continue crestor  20mg .       Return precautions given.   Risks, benefits, and alternatives of the medications and treatment plan prescribed today were discussed, and patient expressed understanding.   Education regarding symptom management and diagnosis given to patient on AVS either electronically or printed.  No follow-ups on file.  Debra Northern, FNP  Subjective:    Patient ID: Debra Hogan, female    DOB: September 20, 1961, 63 y.o.   MRN: [id number]  CC: Debra Hogan is a 63 y.o. female who presents today for follow up.   HPI: HPI Discussed the use of AI scribe software for clinical note transcription with the patient, who gave verbal consent to proceed.  History of Present Illness   Debra Hogan is a 63 year old female with chronic back pain and anxiety who  presents for a follow-up visit.  She experiences chronic back pain, which she manages with hydrocodone  every four hours, containing 325 mg of Tylenol . She also takes gabapentin  100 mg three times a day, which she finds manageable without feeling groggy. Flexeril  10 mg is used at bedtime only when the pain is severe.  She has anxiety and takes Xanax  0.25 mg twice a day. She is also on Lexapro  20 mg daily. She previously tried Cymbalta  in 2013 but believes experienced negative symptoms, though she does not recall the specifics.  She has a history of chest pain, which has improved with an increased dose of Imdur . Her breathing is stable; she denies worsening of shortness of breath.  She has a history of cysts and recently noticed a new bump on her right ventral wrist,non tender. No drainage.   Compliant with lisinopril  40mg  every day  Due influenza, PCV20 ( 1 yr after PPSV23)   Follows with Dr Myra for chronic low back pain; she is compliant with norco. She is taking gabapentin  100mg  tid  Previously followed with [facility] neurology for MS; no recent follow.She had seen Follows with Dr Lesta.   CT lung screen 05/05/24 Lung-RADS 2; of note small  pericardial effusion also seen 06/12/24 echocardiogram  Due mammogram  Follow up cardiology 10/02/24 for chest pain; increased imdur  to 60mg  every day; discussed ischemic evaluation  LDL 50 10/02/24 Allergies: Valium [diazepam] Current Outpatient Medications on File Prior to Visit  Medication Sig Dispense Refill   acyclovir  (ZOVIRAX )  400 MG tablet Take 1 tablet (400 mg total) by mouth daily. 90 tablet 3   ALPRAZolam  (XANAX ) 0.25 MG tablet TAKE 1 TABLET BY MOUTH ONCE DAILY AS NEEDED FOR ANXIETY 30 tablet 0   aspirin  EC 81 MG tablet Take 81 mg by mouth daily.     cholecalciferol (VITAMIN D ) 1000 units tablet Take 1,000 Units by mouth daily.     cyclobenzaprine  (FLEXERIL ) 10 MG tablet Take 10 mg by mouth at bedtime.     cycloSPORINE (RESTASIS) 0.05 %  ophthalmic emulsion 1 drop 2 (two) times daily.     diltiazem  (CARDIZEM  CD) 180 MG 24 hr capsule TAKE 1 CAPSULE BY MOUTH ONCE DAILY 90 capsule 3   escitalopram  (LEXAPRO ) 20 MG tablet Take 1 tablet (20 mg total) by mouth daily. 90 tablet 3   fluticasone -salmeterol (ADVAIR) 250-50 MCG/ACT AEPB Inhale 1 puff into the lungs in the morning and at bedtime. 60 each 12   gabapentin  (NEURONTIN ) 100 MG capsule Take 1 capsule (100 mg total) by mouth 3 (three) times daily. 270 capsule 3   HYDROcodone -acetaminophen  (NORCO/VICODIN) 5-325 MG tablet Take 1 tablet by mouth every 4 (four) hours as needed for moderate pain (pain score 4-6) or severe pain (pain score 7-10). 130 tablet 0   [START ON 11/10/2024] HYDROcodone -acetaminophen  (NORCO/VICODIN) 5-325 MG tablet Take 1 tablet by mouth every 4 (four) hours as needed for moderate pain (pain score 4-6) or severe pain (pain score 7-10). 130 tablet 0   isosorbide  mononitrate (IMDUR ) 60 MG 24 hr tablet Take 1 tablet (60 mg total) by mouth daily. 90 tablet 3   lisinopril  (ZESTRIL ) 40 MG tablet Take 1 tablet (40 mg total) by mouth daily. 90 tablet 4   Multiple Vitamins-Minerals (CENTRUM SILVER ULTRA WOMENS) TABS Take 1 tablet by mouth daily.     nitroGLYCERIN  (NITROSTAT ) 0.4 MG SL tablet Place 1 tablet (0.4 mg total) under the tongue every 5 (five) minutes as needed for chest pain. 25 tablet 3   ondansetron  (ZOFRAN -ODT) 4 MG disintegrating tablet Take 1 tablet (4 mg total) by mouth every 6 (six) hours as needed for nausea or vomiting. 20 tablet 2   PROAIR  RESPICLICK 108 (90 Base) MCG/ACT AEPB INHALE 1 PUFF BY MOUTH EVERY 6 HOURS AS NEEDED FOR COUGH 1 each 0   rosuvastatin  (CRESTOR ) 40 MG tablet Take 1 tablet (40 mg total) by mouth daily. 90 tablet 4   Tiotropium Bromide  Monohydrate (SPIRIVA  RESPIMAT) 2.5 MCG/ACT AERS Inhale 2 puffs into the lungs daily. 60 each 12   No current facility-administered medications on file prior to visit.    Review of Systems   Constitutional:  Negative for chills and fever.  Respiratory:  Positive for shortness of breath (at baseline). Negative for cough.   Cardiovascular:  Negative for chest pain and palpitations.  Gastrointestinal:  Negative for nausea and vomiting.  Musculoskeletal:  Positive for back pain.  Psychiatric/Behavioral:  Negative for sleep disturbance and suicidal ideas. The patient is nervous/anxious.       Objective:    BP 136/78   Pulse (!) 104   Temp 98.3 F (36.8 C) (Oral)   Ht 5' 5 (1.651 m)   Wt 135 lb (61.2 kg)   SpO2 90%   BMI 22.47 kg/m  BP Readings from Last 3 Encounters:  10/22/24 136/78  10/02/24 116/80  09/29/24 110/68   Wt Readings from Last 3 Encounters:  10/22/24 135 lb (61.2 kg)  10/02/24 143 lb 12.8 oz (65.2 kg)  09/29/24 141 lb 12.8  oz (64.3 kg)      10/22/2024    9:07 AM 04/05/2022    3:20 PM  GAD 7 : Generalized Anxiety Score  Nervous, Anxious, on Edge 0 0  Control/stop worrying 0 0  Worry too much - different things 0 0  Trouble relaxing 0 0  Restless 0 0  Easily annoyed or irritable 0 0  Afraid - awful might happen 0 0  Total GAD 7 Score 0 0  Anxiety Difficulty Not difficult at all Not difficult at all       10/22/2024    9:07 AM 07/23/2024    1:11 PM 04/29/2024   11:31 AM  Depression screen PHQ 2/9  Decreased Interest 0 0 0  Down, Depressed, Hopeless 0 1 0  PHQ - 2 Score 0 1 0  Altered sleeping  0   Tired, decreased energy  3   Change in appetite  0   Feeling bad or failure about yourself   0   Trouble concentrating  2   Moving slowly or fidgety/restless  0   Suicidal thoughts  0   PHQ-9 Score  6   Difficult doing work/chores  Very difficult     Physical Exam Vitals reviewed.  Constitutional:      Appearance: She is well-developed.  Eyes:     Conjunctiva/sclera: Conjunctivae normal.  Cardiovascular:     Rate and Rhythm: Normal rate and regular rhythm.     Pulses: Normal pulses.     Heart sounds: Normal heart sounds.   Pulmonary:     Effort: Pulmonary effort is normal.     Breath sounds: Normal breath sounds. No wheezing, rhonchi or rales.  Musculoskeletal:     Right wrist: No swelling. Normal range of motion.       Arms:     Comments: Well circumscribed cyst, nontender, 2-3 cm right ventral aspect wrist  Skin:    General: Skin is warm and dry.  Neurological:     Mental Status: She is alert.  Psychiatric:        Speech: Speech normal.        Behavior: Behavior normal.        Thought Content: Thought content normal.

## 2024-10-23 ENCOUNTER — Other Ambulatory Visit: Payer: Self-pay | Admitting: Family

## 2024-10-23 DIAGNOSIS — N189 Chronic kidney disease, unspecified: Secondary | ICD-10-CM

## 2024-10-23 DIAGNOSIS — I10 Essential (primary) hypertension: Secondary | ICD-10-CM

## 2024-10-23 NOTE — Progress Notes (Signed)
 close

## 2024-11-05 ENCOUNTER — Ambulatory Visit: Admitting: Nurse Practitioner

## 2024-11-05 NOTE — Progress Notes (Deleted)
 Office Visit    Patient Name: Debra Hogan Date of Encounter: 11/05/2024  Primary Care Provider:  Dineen Rollene MATSU, FNP Primary Cardiologist:  Deatrice Cage, MD    Chief Complaint    63 y.o. female  with a history of non-obstructive CAD, coronary vasospasm, fibromyalgia, multiple sclerosis, tobacco use, COPD on supplemental oxygen , degenerative disc disease, chronic back pain, hyperlipidemia, pulmonary hypertension, who presents for ***  Past Medical History   Subjective   Past Medical History:  Diagnosis Date   Anginal pain    Anxiety    Arthritis    Asthma    Chronic heart failure with preserved ejection fraction (HFpEF) (HCC)    a. 10/2023 Echo: EF 65-70%, GrI DD; b. 05/2024 Echo: EF 60-65%, no rwma, nl RV size/fxn, small circumferential pericardial effusion.  No significant valvular disease.   COPD, very severe (HCC)    Coronary artery disease - non-obstructive    a. 09/2019 Cath: LM nl, LAD nl, LCX nl, RCA 40p (vasospasm noted ostial RCA). EF 65%-->ccb started.   Coronary vasospasm    a. 09/2019 Cath: ost RCA vasospasm-->ccb started.   Depression    Dyspnea    emphasema   Fibromyalgia    Headache    Heat stroke 2006   Hypertension    Irritable bowel syndrome    MS (multiple sclerosis)    Followed by Dr. Maree   Myocardial infarction [facility])    On supplemental oxygen  therapy    Osteoarthritis of left shoulder 01/12/2022   PAH (pulmonary artery hypertension) (HCC)    a. 09/2019 RHC: RA 12, RV 42/14, PA 38/18 (27), PCWP 14. CO 4.6 L/min.   Pericardial effusion    a. 10/2023 Echo: small circumferential pericardial effusion without tamponade; b.  05/2024 Echo: Small circumferential pericardial effusion 1 cm of the RV and LV free wall.   Sleep apnea (untreated)    Tobacco abuse    Past Surgical History:  Procedure Laterality Date   ABDOMINAL HYSTERECTOMY     CERVIX intact; had for noncancerous reasons, endometriosis   CATARACT EXTRACTION W/PHACO Left 08/27/2024    Procedure: PHACOEMULSIFICATION, CATARACT, WITH IOL INSERTION 2.85, 00:19.0;  Surgeon: Mittie Gaskin, MD;  Location: [facility];  Service: Ophthalmology;  Laterality: Left;   CHOLECYSTECTOMY N/A 01/03/2016   Procedure: LAPAROSCOPIC CHOLECYSTECTOMY WITH INTRAOPERATIVE CHOLANGIOGRAM;  Surgeon: Charlie FORBES Fell, MD;  Location: [facility] ORS;  Service: General;  Laterality: N/A;   ENDOSCOPIC RETROGRADE CHOLANGIOPANCREATOGRAPHY (ERCP) WITH PROPOFOL  N/A 01/04/2016   Procedure: ENDOSCOPIC RETROGRADE CHOLANGIOPANCREATOGRAPHY (ERCP) WITH PROPOFOL ;  Surgeon: Rogelia Copping, MD;  Location: [facility] END[facility];  Service: Endoscopy;  Laterality: N/A;   FOOT SURGERY  2001   LAPAROSCOPIC ENDOMETRIOSIS FULGURATION     RIGHT/LEFT HEART CATH AND CORONARY ANGIOGRAPHY Bilateral 09/29/2019   Procedure: RIGHT/LEFT HEART CATH AND CORONARY ANGIOGRAPHY;  Surgeon: Mady Bruckner, MD;  Location: [facility] INVASIVE CV [facility];  Service: Cardiovascular;  Laterality: Bilateral;    Allergies  Allergies  Allergen Reactions   Valium [Diazepam] Anxiety       History of Present Illness      63 y.o. y/o female with a history of non-obstructive CAD, coronary vasospasm, fibromyalgia, multiple sclerosis, tobacco use, COPD on supplemental oxygen , degenerative disc disease, chronic back pain, hyperlipidemia, pulmonary hypertension.  She has family history of CAD.  She has a known small pericardial effusion that was first noted on CT scan of the lungs in [place] 2019.  Due to ongoing chest pain she underwent left heart catheterization in 09/2019, which  showed mild (40%) proximal RCA stenosis with catheter induced spasm that improved with nitroglycerin .  No other obstructive disease was noted.  Right heart catheterization showed mild pulmonary hypertension with moderately elevated RA pressure.  Patient had some improvement in symptoms after being placed on diltiazem  for blood pressure and presumed spasm.  Myocardial perfusion stress test in  05/2023 showed no significant ischemia or coronary artery calcification; aortic atherosclerosis and small pericardial effusion were noted. Echo in 05/2024 showed normal pumping function with LVEF of 60%-65%, no RWMA, no valvular abnormalities, and small pericardial effusion with no changes in size when compared to prior imaging in 2024.  Long-acting nitroglycerin  dose was titrated in October 2025 in the setting of complaints of more frequent rest and exertional chest pain similar to prior episodes.   Since her last visit, Objective   Home Medications    Current Outpatient Medications  Medication Sig Dispense Refill   acyclovir  (ZOVIRAX ) 400 MG tablet Take 1 tablet (400 mg total) by mouth daily. 90 tablet 3   ALPRAZolam  (XANAX ) 0.25 MG tablet TAKE 1 TABLET BY MOUTH ONCE DAILY AS NEEDED FOR ANXIETY 30 tablet 0   aspirin  EC 81 MG tablet Take 81 mg by mouth daily.     cholecalciferol (VITAMIN D ) 1000 units tablet Take 1,000 Units by mouth daily.     cyclobenzaprine  (FLEXERIL ) 10 MG tablet Take 10 mg by mouth at bedtime.     cycloSPORINE (RESTASIS) 0.05 % ophthalmic emulsion 1 drop 2 (two) times daily.     diltiazem  (CARDIZEM  CD) 180 MG 24 hr capsule TAKE 1 CAPSULE BY MOUTH ONCE DAILY 90 capsule 3   escitalopram  (LEXAPRO ) 20 MG tablet Take 1 tablet (20 mg total) by mouth daily. 90 tablet 3   fluticasone -salmeterol (ADVAIR) 250-50 MCG/ACT AEPB Inhale 1 puff into the lungs in the morning and at bedtime. 60 each 12   gabapentin  (NEURONTIN ) 100 MG capsule Take 1 capsule (100 mg total) by mouth 3 (three) times daily. 270 capsule 3   HYDROcodone -acetaminophen  (NORCO/VICODIN) 5-325 MG tablet Take 1 tablet by mouth every 4 (four) hours as needed for moderate pain (pain score 4-6) or severe pain (pain score 7-10). 130 tablet 0   [START ON 11/10/2024] HYDROcodone -acetaminophen  (NORCO/VICODIN) 5-325 MG tablet Take 1 tablet by mouth every 4 (four) hours as needed for moderate pain (pain score 4-6) or severe pain  (pain score 7-10). 130 tablet 0   isosorbide  mononitrate (IMDUR ) 60 MG 24 hr tablet Take 1 tablet (60 mg total) by mouth daily. 90 tablet 3   lisinopril  (ZESTRIL ) 40 MG tablet Take 1 tablet (40 mg total) by mouth daily. 90 tablet 4   Multiple Vitamins-Minerals (CENTRUM SILVER ULTRA WOMENS) TABS Take 1 tablet by mouth daily.     nitroGLYCERIN  (NITROSTAT ) 0.4 MG SL tablet Place 1 tablet (0.4 mg total) under the tongue every 5 (five) minutes as needed for chest pain. 25 tablet 3   ondansetron  (ZOFRAN -ODT) 4 MG disintegrating tablet Take 1 tablet (4 mg total) by mouth every 6 (six) hours as needed for nausea or vomiting. 20 tablet 2   PROAIR  RESPICLICK 108 (90 Base) MCG/ACT AEPB INHALE 1 PUFF BY MOUTH EVERY 6 HOURS AS NEEDED FOR COUGH 1 each 0   rosuvastatin  (CRESTOR ) 40 MG tablet Take 1 tablet (40 mg total) by mouth daily. 90 tablet 4   Tiotropium Bromide  Monohydrate (SPIRIVA  RESPIMAT) 2.5 MCG/ACT AERS Inhale 2 puffs into the lungs daily. 60 each 12   No current facility-administered medications for this visit.  Physical Exam    VS:  There were no vitals taken for this visit. , BMI There is no height or weight on file to calculate BMI.          GEN: Well nourished, well developed, in no acute distress. HEENT: normal. Neck: Supple, no JVD, carotid bruits, or masses. Cardiac: RRR, no murmurs, rubs, or gallops. No clubbing, cyanosis, edema.  Radials 2+/PT 2+ and equal bilaterally.  Respiratory:  Respirations regular and unlabored, clear to auscultation bilaterally. GI: Soft, nontender, nondistended, BS + x 4. MS: no deformity or atrophy. Skin: warm and dry, no rash. Neuro:  Strength and sensation are intact. Psych: Normal affect.  Accessory Clinical Findings    ECG personally reviewed by me today -    *** - no acute changes.  Lab Results  Component Value Date   WBC 7.0 01/03/2024   HGB 14.0 01/03/2024   HCT 41.3 01/03/2024   MCV 95.5 01/03/2024   PLT 237.0 01/03/2024   Lab  Results  Component Value Date   CREATININE 0.80 05/15/2024   BUN 13 05/15/2024   NA 143 05/15/2024   K 4.0 05/15/2024   CL 98 05/15/2024   CO2 37 (H) 05/15/2024   Lab Results  Component Value Date   ALT 10 10/02/2024   AST 18 10/02/2024   ALKPHOS 64 10/02/2024   BILITOT 0.2 10/02/2024   Lab Results  Component Value Date   CHOL 131 10/02/2024   HDL 55 10/02/2024   LDLCALC 50 10/02/2024   LDLDIRECT 86.0 04/05/2022   TRIG 157 (H) 10/02/2024   CHOLHDL 2.4 10/02/2024    Lab Results  Component Value Date   HGBA1C 5.4 04/05/2022   Lab Results  Component Value Date   TSH 3.28 01/03/2024       Assessment & Plan    1.  ***  Lonni Meager, NP 11/05/2024, 7:54 AM

## 2024-11-06 ENCOUNTER — Telehealth: Payer: Self-pay | Admitting: Family

## 2024-11-06 ENCOUNTER — Other Ambulatory Visit: Payer: Self-pay | Admitting: Family

## 2024-11-06 DIAGNOSIS — F32A Depression, unspecified: Secondary | ICD-10-CM

## 2024-11-06 DIAGNOSIS — F4320 Adjustment disorder, unspecified: Secondary | ICD-10-CM

## 2024-11-06 NOTE — Telephone Encounter (Signed)
 Call pt   I have refilled your xanax   However I wanted to remind you that this is controlled substance.   In order for me to prescribe medication,  patients must be seen every 3 months.   Please make follow-up appointment 3 months last appt  I looked up patient on [place] Controlled Substances Reporting System and saw no activity that raised concern of inappropriate use.

## 2024-11-06 NOTE — Telephone Encounter (Signed)
 Pt is aware of refill pt had LOV 10/22/24 scheduled NOV 01/22/25

## 2024-11-11 ENCOUNTER — Telehealth: Payer: Self-pay | Admitting: Student in an Organized Health Care Education/Training Program

## 2024-11-11 ENCOUNTER — Other Ambulatory Visit: Payer: Self-pay | Admitting: Family

## 2024-11-11 NOTE — Progress Notes (Signed)
 close

## 2024-11-11 NOTE — Telephone Encounter (Signed)
 PCC's, what is the status of the order?

## 2024-11-11 NOTE — Telephone Encounter (Signed)
 Copied from CRM #[id number]. Topic: Clinical - Order For Equipment >> Nov 10, 2024  1:25 PM Russell PARAS wrote: Reason for CRM:   Pt is contacting clinic regarding the order for POC and battery pack that was to be sent to [employer]. She was switching from the [facility] to [place] location. Reviewed chart and advised order was received on 10/6. She has not been contacted by [employer]. No further updates after that  Pt requested call back with status update on order  CB#  [phone]

## 2024-11-13 NOTE — Discharge Instructions (Signed)

## 2024-11-13 NOTE — Telephone Encounter (Signed)
I have sent urgent message to [employer] to check on this issue

## 2024-11-14 NOTE — Telephone Encounter (Signed)
 I received a message from Dolanda with [employer] [place], Dolanda   I am not showing an acct with us  for oxygen  I will have to look into this cause the only note Avelina has is unsure how long patient was with the other company. [place] has a medicare plan so they can not switch providers for billing purposes after a year

## 2024-11-14 NOTE — Telephone Encounter (Signed)
 I have received another message from Debra Hogan with [employer] Viktoria Hawks   Not sure what is going on with this referral an acct was never created but note from Avelina saying tried to reach patient to get information because of her insurance if she has been with other provider for a year or more we cannot switch her. I have tried to contact patient as well with no response but left vm

## 2024-11-17 ENCOUNTER — Encounter: Payer: Self-pay | Admitting: Ophthalmology

## 2024-11-17 NOTE — Anesthesia Preprocedure Evaluation (Addendum)
 Anesthesia Evaluation  Patient identified by MRN, date of birth, ID band Patient awake    Reviewed: Allergy & Precautions, H&P , NPO status , Patient's Chart, lab work & pertinent test results  Airway Mallampati: III  TM Distance: >3 FB Neck ROM: Full    Dental no notable dental hx. (+) Chipped, Partial Lower  Chipped, Partial Lower Chipped Lower bridge :  :   Pulmonary neg pulmonary ROS, shortness of breath, asthma , sleep apnea , COPD, Current Smoker and Patient abstained from smoking. Note from 09-29-24  note the severity of the emphysema in the upper lobes, more so in the RUL. Options for Debra Hogan, should she quit smoking, would include endobronchial valve placement for EBLVR (endobronchial lung volume reduction)  as well as consideration for lung transplantation. We will discuss these in the future.   Pulmonary exam normal breath sounds clear to auscultation       Cardiovascular hypertension, + angina  + CAD and + Past MI  negative cardio ROS Normal cardiovascular exam Rhythm:Regular Rate:Normal  Rhythm:Regular  06-12-24 1. Left ventricular ejection fraction, by estimation, is 60 to 65%. The  left ventricle has normal function. The left ventricle has no regional  wall motion abnormalities. Left ventricular diastolic parameters are  indeterminate.   2. Right ventricular systolic function is normal. The right ventricular  size is normal.   3. A small pericardial effusion is present. The pericardial effusion is  circumferential. 1 cm off the RV and LV free wall.   4. The mitral valve is normal in structure. No evidence of mitral valve  regurgitation. No evidence of mitral stenosis.   5. The aortic valve is normal in structure. Aortic valve regurgitation is  not visualized. No aortic stenosis is present.   6. The inferior vena cava is normal in size with greater than 50%  respiratory variability, suggesting right atrial  pressure of 3 mmHg.         Neuro/Psych  Headaches PSYCHIATRIC DISORDERS Anxiety Depression     Neuromuscular disease negative neurological ROS  negative psych ROS   GI/Hepatic negative GI ROS, Neg liver ROS,GERD  ,,  Endo/Other  negative endocrine ROSHypothyroidism    Renal/GU Renal diseasenegative Renal ROS  negative genitourinary   Musculoskeletal negative musculoskeletal ROS (+) Arthritis ,  Fibromyalgia -  Abdominal   Peds negative pediatric ROS (+)  Hematology negative hematology ROS (+)   Anesthesia Other Findings Previous cataract surgery 08-27-24, surgery canceled 09-24-24 due to difficulty breathing, see note from 09-24-24 below: States her multiple sclerosis is worse at present, was quite weak, gait unsteady and needed assistance to walk, used walker but still unsteady and needed assistance to not fall.  Very short of breath, increased work of breathing, initial pulse oximetry 80%, on auscultation bilateral wheezing. With rest and oxygen  per nasal cannula, pulse oximetry came up to 91%, patient refused duoneb and CXR, states she wants to have her doctor check her, so she is to go to her doctor now.  Today, exacerbation of COPD.  Coughing last night. Will reschedule when better optimized.    Today, had greatly increased work of breathing, didn't take her nebulizer prior to coming, so administered duoneb jet neb and sounds much better and breathing much more relaxed after that. Still on oxygen  3 L/m/n/c as constant   Reproductive/Obstetrics negative OB ROS  Anesthesia Physical Anesthesia Plan  ASA: 4  Anesthesia Plan: MAC   Post-op Pain Management:    Induction: Intravenous  PONV Risk Score and Plan:   Airway Management Planned: Natural Airway and Nasal Cannula  Additional Equipment:   Intra-op Plan:   Post-operative Plan:   Informed Consent: I have reviewed the patients History and Physical, chart,  labs and discussed the procedure including the risks, benefits and alternatives for the proposed anesthesia with the patient or authorized representative who has indicated his/her understanding and acceptance.     Dental Advisory Given  Plan Discussed with: Anesthesiologist, CRNA and Surgeon  Anesthesia Plan Comments: (Patient consented for risks of anesthesia including but not limited to:  - adverse reactions to medications - damage to eyes, teeth, lips or other oral mucosa - nerve damage due to positioning  - sore throat or hoarseness - Damage to heart, brain, nerves, lungs, other parts of body or loss of life  Patient voiced understanding and assent.)         Anesthesia Quick Evaluation

## 2024-11-19 HISTORY — DX: Myositis, unspecified: M60.9

## 2024-11-19 HISTORY — DX: Other chronic pain: G89.29

## 2024-11-19 HISTORY — DX: Other intervertebral disc degeneration, lumbar region without mention of lumbar back pain or lower extremity pain: M51.369

## 2024-11-19 HISTORY — DX: Other specified hypothyroidism: E03.8

## 2024-11-19 HISTORY — DX: Other cervical disc degeneration, unspecified cervical region: M50.30

## 2024-11-19 HISTORY — DX: Atherosclerosis of aorta: I70.0

## 2024-11-19 HISTORY — DX: Unspecified chronic bronchitis: J42

## 2024-11-19 HISTORY — DX: Pain in thoracic spine: M54.6

## 2024-11-19 HISTORY — DX: Other fatigue: R53.83

## 2024-11-19 HISTORY — DX: Opioid use, unspecified, uncomplicated: F11.90

## 2024-11-19 HISTORY — DX: Chronic respiratory failure with hypoxia: J96.11

## 2024-11-19 HISTORY — DX: Neuralgia and neuritis, unspecified: M79.2

## 2024-11-19 HISTORY — DX: Chronic pain syndrome: G89.4

## 2024-11-19 HISTORY — DX: Myalgia, unspecified site: M79.10

## 2024-11-19 HISTORY — DX: Anxiety disorder, unspecified: F41.9

## 2024-11-19 HISTORY — DX: Cerebral aneurysm, nonruptured: I67.1

## 2024-11-19 HISTORY — DX: Generalized anxiety disorder: F41.1

## 2024-11-26 ENCOUNTER — Ambulatory Visit: Admitting: Nurse Practitioner

## 2024-12-10 ENCOUNTER — Encounter: Payer: Self-pay | Admitting: Ophthalmology

## 2024-12-10 ENCOUNTER — Ambulatory Visit
Admission: RE | Admit: 2024-12-10 | Discharge: 2024-12-10 | Disposition: A | Attending: Ophthalmology | Admitting: Ophthalmology

## 2024-12-10 ENCOUNTER — Encounter: Payer: Self-pay | Admitting: Anesthesiology

## 2024-12-10 ENCOUNTER — Ambulatory Visit: Payer: Self-pay | Admitting: Anesthesiology

## 2024-12-10 ENCOUNTER — Other Ambulatory Visit: Payer: Self-pay

## 2024-12-10 ENCOUNTER — Encounter: Admission: RE | Disposition: A | Payer: Self-pay | Source: Home / Self Care | Attending: Ophthalmology

## 2024-12-10 DIAGNOSIS — F17210 Nicotine dependence, cigarettes, uncomplicated: Secondary | ICD-10-CM | POA: Insufficient documentation

## 2024-12-10 DIAGNOSIS — I110 Hypertensive heart disease with heart failure: Secondary | ICD-10-CM | POA: Diagnosis not present

## 2024-12-10 DIAGNOSIS — I5032 Chronic diastolic (congestive) heart failure: Secondary | ICD-10-CM | POA: Insufficient documentation

## 2024-12-10 DIAGNOSIS — I2510 Atherosclerotic heart disease of native coronary artery without angina pectoris: Secondary | ICD-10-CM | POA: Diagnosis not present

## 2024-12-10 DIAGNOSIS — Z59868 Other specified financial insecurity: Secondary | ICD-10-CM | POA: Diagnosis not present

## 2024-12-10 DIAGNOSIS — J4489 Other specified chronic obstructive pulmonary disease: Secondary | ICD-10-CM | POA: Diagnosis not present

## 2024-12-10 DIAGNOSIS — H2511 Age-related nuclear cataract, right eye: Secondary | ICD-10-CM | POA: Diagnosis present

## 2024-12-10 DIAGNOSIS — I252 Old myocardial infarction: Secondary | ICD-10-CM | POA: Insufficient documentation

## 2024-12-10 HISTORY — DX: Pain in thoracic spine: M54.6

## 2024-12-10 HISTORY — DX: Other chronic pain: G89.29

## 2024-12-10 HISTORY — DX: Unspecified chronic bronchitis: J42

## 2024-12-10 HISTORY — DX: Neuralgia and neuritis, unspecified: M79.2

## 2024-12-10 HISTORY — PX: CATARACT EXTRACTION W/PHACO: SHX586

## 2024-12-10 HISTORY — DX: Chronic pain syndrome: G89.4

## 2024-12-10 HISTORY — DX: Anxiety disorder, unspecified: F41.9

## 2024-12-10 HISTORY — DX: Other cervical disc degeneration, unspecified cervical region: M50.30

## 2024-12-10 HISTORY — DX: Other specified hypothyroidism: E03.8

## 2024-12-10 HISTORY — DX: Myositis, unspecified: M60.9

## 2024-12-10 HISTORY — DX: Atherosclerosis of aorta: I70.0

## 2024-12-10 HISTORY — DX: Other fatigue: R53.83

## 2024-12-10 HISTORY — DX: Myalgia, unspecified site: M79.10

## 2024-12-10 HISTORY — DX: Opioid use, unspecified, uncomplicated: F11.90

## 2024-12-10 HISTORY — DX: Generalized anxiety disorder: F41.1

## 2024-12-10 HISTORY — DX: Chronic respiratory failure with hypoxia: J96.11

## 2024-12-10 HISTORY — DX: Cerebral aneurysm, nonruptured: I67.1

## 2024-12-10 HISTORY — DX: Other intervertebral disc degeneration, lumbar region without mention of lumbar back pain or lower extremity pain: M51.369

## 2024-12-10 SURGERY — PHACOEMULSIFICATION, CATARACT, WITH IOL INSERTION
Anesthesia: Monitor Anesthesia Care | Laterality: Right

## 2024-12-10 MED ORDER — SIGHTPATH DOSE#1 BSS IO SOLN
INTRAOCULAR | Status: DC | PRN
  Administered 2024-12-10: 10:00:00 56 mL via OPHTHALMIC

## 2024-12-10 MED ORDER — MIDAZOLAM HCL (PF) 2 MG/2ML IJ SOLN
INTRAMUSCULAR | Status: DC | PRN
  Administered 2024-12-10: 09:00:00 1 mg via INTRAVENOUS

## 2024-12-10 MED ORDER — TETRACAINE HCL 0.5 % OP SOLN
1.0000 [drp] | OPHTHALMIC | Status: DC | PRN
  Administered 2024-12-10 (×3): 1 [drp] via OPHTHALMIC

## 2024-12-10 MED ORDER — IPRATROPIUM-ALBUTEROL 0.5-2.5 (3) MG/3ML IN SOLN
3.0000 mL | Freq: Once | RESPIRATORY_TRACT | Status: AC
  Administered 2024-12-10: 09:00:00 3 mL via RESPIRATORY_TRACT

## 2024-12-10 MED ORDER — SIGHTPATH DOSE#1 BSS IO SOLN
INTRAOCULAR | Status: DC | PRN
  Administered 2024-12-10: 09:00:00 15 mL via INTRAOCULAR

## 2024-12-10 MED ORDER — DEXMEDETOMIDINE HCL IN NACL 400 MCG/100ML IV SOLN
INTRAVENOUS | Status: DC | PRN
  Administered 2024-12-10: 09:00:00 4 ug via INTRAVENOUS
  Administered 2024-12-10: 09:00:00 8 ug via INTRAVENOUS
  Administered 2024-12-10 (×2): 4 ug via INTRAVENOUS

## 2024-12-10 MED ORDER — TETRACAINE HCL 0.5 % OP SOLN
OPHTHALMIC | Status: AC
  Filled 2024-12-10: qty 4

## 2024-12-10 MED ORDER — BRIMONIDINE TARTRATE-TIMOLOL 0.2-0.5 % OP SOLN
OPHTHALMIC | Status: DC | PRN
  Administered 2024-12-10: 10:00:00 1 [drp] via OPHTHALMIC

## 2024-12-10 MED ORDER — CEFUROXIME OPHTHALMIC INJECTION 1 MG/0.1 ML
INJECTION | OPHTHALMIC | Status: DC | PRN
  Administered 2024-12-10: 10:00:00 .1 mL via INTRACAMERAL

## 2024-12-10 MED ORDER — CYCLOPENTOLATE HCL 2 % OP SOLN
1.0000 [drp] | OPHTHALMIC | Status: AC
  Administered 2024-12-10 (×2): 1 [drp] via OPHTHALMIC

## 2024-12-10 MED ORDER — TETRACAINE HCL 0.5 % OP SOLN
OPHTHALMIC | Status: DC | PRN
  Administered 2024-12-10: 09:00:00 2 [drp] via OPHTHALMIC

## 2024-12-10 MED ORDER — BALANCED SALT IO SOLN
INTRAOCULAR | Status: DC | PRN
  Administered 2024-12-10: 09:00:00 2 mL

## 2024-12-10 MED ORDER — MIDAZOLAM HCL 2 MG/2ML IJ SOLN
INTRAMUSCULAR | Status: AC
  Filled 2024-12-10: qty 2

## 2024-12-10 MED ORDER — CYCLOPENTOLATE HCL 2 % OP SOLN
OPHTHALMIC | Status: AC
  Filled 2024-12-10: qty 2

## 2024-12-10 MED ORDER — LACTATED RINGERS IV SOLN: INTRAVENOUS | Status: DC

## 2024-12-10 MED ORDER — PHENYLEPHRINE HCL 10 % OP SOLN
OPHTHALMIC | Status: AC
  Filled 2024-12-10: qty 5

## 2024-12-10 MED ORDER — PHENYLEPHRINE HCL 10 % OP SOLN
1.0000 [drp] | OPHTHALMIC | Status: AC
  Administered 2024-12-10 (×2): 1 [drp] via OPHTHALMIC

## 2024-12-10 MED ADMIN — Na Hyaluron & Na Hyaluron-Na Chondroit Sul Op Kit 0.55-0.5ML: 1 | OPHTHALMIC | @ 09:00:00 | NDC 8065183150

## 2024-12-10 MED ADMIN — Fentanyl Citrate Preservative Free (PF) Inj 100 MCG/2ML: 50 ug | INTRAVENOUS | @ 09:00:00 | NDC 72572017025

## 2024-12-10 MED FILL — Ipratropium-Albuterol Nebu Soln 0.5-2.5(3) MG/3ML: RESPIRATORY_TRACT | Qty: 3 | Status: AC

## 2024-12-10 MED FILL — Dexmedetomidine HCl in NaCl 0.9% IV Soln 80 MCG/20ML: INTRAVENOUS | Qty: 20 | Status: AC

## 2024-12-10 MED FILL — Fentanyl Citrate Preservative Free (PF) Inj 100 MCG/2ML: INTRAMUSCULAR | Qty: 2 | Status: AC

## 2024-12-10 SURGICAL SUPPLY — 9 items
FEE CATARACT SUITE SIGHTPATH (MISCELLANEOUS) ×1 IMPLANT
GLOVE BIOGEL PI IND STRL 8 (GLOVE) ×1 IMPLANT
GLOVE SURG LX STRL 7.5 STRW (GLOVE) ×1 IMPLANT
GLOVE SURG SYN 6.5 PF PI BL (GLOVE) ×1 IMPLANT
LENS IOL TECNIS EYHANCE 19.5 (Intraocular Lens) IMPLANT
NDL FILTER BLUNT 18X1 1/2 (NEEDLE) ×1 IMPLANT
NEEDLE FILTER BLUNT 18X1 1/2 (NEEDLE) ×1 IMPLANT
RING TENSION CAPSULAR (Miscellaneous) IMPLANT
SYR 3ML LL SCALE MARK (SYRINGE) ×1 IMPLANT

## 2024-12-10 NOTE — Transfer of Care (Signed)
 Immediate Anesthesia Transfer of Care Note  Patient: Debra Hogan  Procedure(s) Performed: PHACOEMULSIFICATION, CATARACT, WITH IOL INSERTION 6.93, 01:01.4 (Right)  Patient Location: PACU  Anesthesia Type: MAC  Level of Consciousness: awake, alert  and patient cooperative  Airway and Oxygen  Therapy: Patient Spontanous Breathing and Patient connected to supplemental oxygen   Post-op Assessment: Post-op Vital signs reviewed, Patient's Cardiovascular Status Stable, Respiratory Function Stable, Patent Airway and No signs of Nausea or vomiting  Post-op Vital Signs: Reviewed and stable  Complications: No notable events documented.

## 2024-12-10 NOTE — H&P (Signed)
 [facility]   Primary Care Physician:  Dineen Rollene MATSU, FNP Ophthalmologist: Dr. Dene Etienne  Pre-Procedure History & Physical: HPI:  Debra Hogan is a 63 y.o. female here for ophthalmic surgery.   Past Medical History:  Diagnosis Date   Anginal pain    Anxiety    Anxiety and depression    Arthritis    Asthma    Atherosclerosis of aorta    Cerebral aneurysm    Chronic bronchitis (HCC)    Chronic heart failure with preserved ejection fraction (HFpEF) (HCC)    a. 10/2023 Echo: EF 65-70%, GrI DD; b. 05/2024 Echo: EF 60-65%, no rwma, nl RV size/fxn, small circumferential pericardial effusion.  No significant valvular disease.   Chronic hypoxic respiratory failure, on home oxygen  therapy (HCC)    Chronic low back pain with sciatica    Chronic pain syndrome    Chronic, continuous use of opioids    COPD, very severe (HCC)    Coronary artery disease - non-obstructive    a. 09/2019 Cath: LM nl, LAD nl, LCX nl, RCA 40p (vasospasm noted ostial RCA). EF 65%-->ccb started.   Coronary vasospasm    a. 09/2019 Cath: ost RCA vasospasm-->ccb started.   DDD (degenerative disc disease), cervical    DDD (degenerative disc disease), lumbar    Depression    Dyspnea    emphasema   Fatigue    Fibromyalgia    Generalized anxiety disorder    Headache    Heat stroke 2006   Hypertension    Hypertension    Irritable bowel syndrome    MS (multiple sclerosis)    Followed by Dr. Maree   Myalgia    Myocardial infarction [facility])    Myositis    Neuropathic pain    On supplemental oxygen  therapy    on 3L [place]   Osteoarthritis of left shoulder 01/12/2022   PAH (pulmonary artery hypertension) (HCC)    a. 09/2019 RHC: RA 12, RV 42/14, PA 38/18 (27), PCWP 14. CO 4.6 L/min.   Pericardial effusion    a. 10/2023 Echo: small circumferential pericardial effusion without tamponade; b.  05/2024 Echo: Small circumferential pericardial effusion 1 cm of the RV and LV free wall.   Right-sided  thoracic back pain    Sleep apnea (untreated)    Subclinical hypothyroidism    Tobacco abuse     Past Surgical History:  Procedure Laterality Date   ABDOMINAL HYSTERECTOMY     CERVIX intact; had for noncancerous reasons, endometriosis   CATARACT EXTRACTION W/PHACO Left 08/27/2024   Procedure: PHACOEMULSIFICATION, CATARACT, WITH IOL INSERTION 2.85, 00:19.0;  Surgeon: Etienne Dene, MD;  Location: [facility];  Service: Ophthalmology;  Laterality: Left;   CHOLECYSTECTOMY N/A 01/03/2016   Procedure: LAPAROSCOPIC CHOLECYSTECTOMY WITH INTRAOPERATIVE CHOLANGIOGRAM;  Surgeon: Charlie FORBES Fell, MD;  Location: [facility] ORS;  Service: General;  Laterality: N/A;   ENDOSCOPIC RETROGRADE CHOLANGIOPANCREATOGRAPHY (ERCP) WITH PROPOFOL  N/A 01/04/2016   Procedure: ENDOSCOPIC RETROGRADE CHOLANGIOPANCREATOGRAPHY (ERCP) WITH PROPOFOL ;  Surgeon: Rogelia Copping, MD;  Location: [facility];  Service: Endoscopy;  Laterality: N/A;   FOOT SURGERY  2001   LAPAROSCOPIC ENDOMETRIOSIS FULGURATION     RIGHT/LEFT HEART CATH AND CORONARY ANGIOGRAPHY Bilateral 09/29/2019   Procedure: RIGHT/LEFT HEART CATH AND CORONARY ANGIOGRAPHY;  Surgeon: Mady Bruckner, MD;  Location: [facility];  Service: Cardiovascular;  Laterality: Bilateral;    Prior to Admission medications  Medication Sig Start Date End Date Taking? Authorizing Provider  acyclovir  (ZOVIRAX ) 400 MG tablet Take 1 tablet (  400 mg total) by mouth daily. 04/29/24   Dineen Rollene MATSU, FNP  ALPRAZolam  (XANAX ) 0.25 MG tablet Take 1 tablet by mouth twice daily as needed for anxiety 11/06/24   Dineen Rollene MATSU, FNP  aspirin  EC 81 MG tablet Take 81 mg by mouth daily.    [provider]  cholecalciferol (VITAMIN D ) 1000 units tablet Take 1,000 Units by mouth daily.    [provider]  cyclobenzaprine  (FLEXERIL ) 10 MG tablet Take 10 mg by mouth at bedtime. 10/14/24   [provider]  cycloSPORINE (RESTASIS) 0.05 % ophthalmic  emulsion 1 drop 2 (two) times daily. 11/21/23   [provider]  diltiazem  (CARDIZEM  CD) 180 MG 24 hr capsule TAKE 1 CAPSULE BY MOUTH ONCE DAILY 04/29/24   Dineen Rollene MATSU, FNP  escitalopram  (LEXAPRO ) 20 MG tablet Take 1 tablet (20 mg total) by mouth daily. 04/29/24   Dineen Rollene MATSU, FNP  fluticasone -salmeterol (ADVAIR) 250-50 MCG/ACT AEPB Inhale 1 puff into the lungs in the morning and at bedtime. 09/29/24   Isadora Hose, MD  gabapentin  (NEURONTIN ) 100 MG capsule Take 1 capsule (100 mg total) by mouth 3 (three) times daily. 04/29/24   Dineen Rollene MATSU, FNP  HYDROcodone -acetaminophen  (NORCO/VICODIN) 5-325 MG tablet Take 1 tablet by mouth every 4 (four) hours as needed for moderate pain (pain score 4-6) or severe pain (pain score 7-10). 11/10/24 12/10/24  Myra Lynwood MATSU, MD  isosorbide  mononitrate (IMDUR ) 60 MG 24 hr tablet Take 1 tablet (60 mg total) by mouth daily. 10/02/24   Vivienne Lonni Ingle, NP  lisinopril  (ZESTRIL ) 40 MG tablet Take 1 tablet (40 mg total) by mouth daily. 05/06/24   Dineen Rollene MATSU, FNP  Multiple Vitamins-Minerals (CENTRUM SILVER ULTRA WOMENS) TABS Take 1 tablet by mouth daily.    [provider]  nitroGLYCERIN  (NITROSTAT ) 0.4 MG SL tablet Place 1 tablet (0.4 mg total) under the tongue every 5 (five) minutes as needed for chest pain. 12/10/23 12/09/24  Rana Lum CROME, NP  ondansetron  (ZOFRAN -ODT) 4 MG disintegrating tablet Take 1 tablet (4 mg total) by mouth every 6 (six) hours as needed for nausea or vomiting. 01/06/21   Dineen Rollene MATSU, FNP  PROAIR  RESPICLICK 108 (90 Base) MCG/ACT AEPB INHALE 1 PUFF BY MOUTH EVERY 6 HOURS AS NEEDED FOR COUGH 07/03/23   Dineen Rollene MATSU, FNP  rosuvastatin  (CRESTOR ) 40 MG tablet Take 1 tablet (40 mg total) by mouth daily. 05/06/24   Dineen Rollene MATSU, FNP  Tiotropium Bromide  Monohydrate (SPIRIVA  RESPIMAT) 2.5 MCG/ACT AERS Inhale 2 puffs into the lungs daily. 12/12/23   Isadora Hose, MD    Allergies as of  10/30/2024 - Review Complete 10/22/2024  Allergen Reaction Noted   Valium [diazepam] Anxiety 10/21/2012    Family History  Problem Relation Age of Onset   Hypertension Mother    Hyperlipidemia Mother    Arthritis Mother    Cancer Father        throat   Heart disease Brother    Bipolar disorder Son    Schizophrenia Son    Cancer Maternal Grandfather        liver   Cancer Paternal Grandmother        liver    Social History   Socioeconomic History   Marital status: Married    Spouse name: Not on file   Number of children: Not on file   Years of education: Not on file   Highest education level: GED or equivalent  Occupational History   Not  on file  Tobacco Use   Smoking status: Every Day    Current packs/day: 1.00    Average packs/day: 1 pack/day for 53.0 years (53.0 ttl pk-yrs)    Types: Cigarettes    Start date: December 21, 1972   Smokeless tobacco: Never   Tobacco comments:    0.5 PPD- khj 09/29/2024  Vaping Use   Vaping status: Never Used  Substance and Sexual Activity   Alcohol use: No    Alcohol/week: 0.0 standard drinks of alcohol   Drug use: Yes    Types: Marijuana, Cocaine    Comment: 30 years ago   Sexual activity: Not Currently    Birth control/protection: Post-menopausal  Other Topics Concern   Not on file  Social History Narrative   Lives in [place]. Widowed x 2.      Velma Son died 12-21-22 at 58 years of age.       Married for 14  years.       On disability      Social Drivers of Health   Tobacco Use: High Risk (11/17/2024)   Patient History    Smoking Tobacco Use: Every Day    Smokeless Tobacco Use: Never    Passive Exposure: Not on file  Financial Resource Strain: Medium Risk (10/22/2024)   Overall Financial Resource Strain (CARDIA)    Difficulty of Paying Living Expenses: Somewhat hard  Food Insecurity: No Food Insecurity (10/22/2024)   Epic    Worried About Programme Researcher, Broadcasting/film/video in the Last Year: Never true    Ran Out of Food in the Last  Year: Never true  Transportation Needs: No Transportation Needs (10/22/2024)   Epic    Lack of Transportation (Medical): No    Lack of Transportation (Non-Medical): No  Physical Activity: Inactive (10/22/2024)   Exercise Vital Sign    Days of Exercise per Week: 0 days    Minutes of Exercise per Session: Not on file  Stress: No Stress Concern Present (10/22/2024)   [employer] of Occupational Health - Occupational Stress Questionnaire    Feeling of Stress: Not at all  Social Connections: Moderately Isolated (10/22/2024)   Social Connection and Isolation Panel    Frequency of Communication with Friends and Family: More than three times a week    Frequency of Social Gatherings with Friends and Family: Once a week    Attends Religious Services: Patient declined    Active Member of Clubs or Organizations: No    Attends Engineer, Structural: Not on file    Marital Status: Married  Catering Manager Violence: Not At Risk (07/23/2024)   Epic    Fear of Current or Ex-Partner: No    Emotionally Abused: No    Physically Abused: No    Sexually Abused: No  Depression (PHQ2-9): Low Risk (10/22/2024)   Depression (PHQ2-9)    PHQ-2 Score: 0  Alcohol Screen: Low Risk (07/23/2024)   Alcohol Screen    Last Alcohol Screening Score (AUDIT): 0  Housing: Low Risk (10/22/2024)   Epic    Unable to Pay for Housing in the Last Year: No    Number of Times Moved in the Last Year: 0    Homeless in the Last Year: No  Utilities: Not At Risk (07/23/2024)   Epic    Threatened with loss of utilities: No  Health Literacy: Adequate Health Literacy (07/23/2024)   B1300 Health Literacy    Frequency of need for help with medical instructions: Never    Review of Systems:  See HPI, otherwise negative ROS  Physical Exam: Ht 5' 5 (1.651 m)   Wt 67.1 kg   BMI 24.63 kg/m  General:   Alert,  pleasant and cooperative in NAD Head:  Normocephalic and atraumatic. Lungs:  Clear to auscultation.     Heart:  Regular rate and rhythm.   Impression/Plan: Debra Hogan is here for ophthalmic surgery.  Risks, benefits, limitations, and alternatives regarding ophthalmic surgery have been reviewed with the patient.  Questions have been answered.  All parties agreeable.   Debra GASKIN, MD  12/10/2024, 8:49 AM

## 2024-12-10 NOTE — Op Note (Signed)
 LOCATION:  [facility]   PREOPERATIVE DIAGNOSIS:    Nuclear sclerotic cataract right eye. H25.11   POSTOPERATIVE DIAGNOSIS:  Nuclear sclerotic cataract right eye with zonular weakness and dialysis   PROCEDURE:  Phacoemusification with posterior chamber intraocular lens placement of the right eye with capsular tension ring placement  ULTRASOUND TIME: Procedures: PHACOEMULSIFICATION, CATARACT, WITH IOL INSERTION 6.93, 01:01.4 (Right)  LENS:   Implant Name Type Inv. Item Serial No. Manufacturer Lot No. LRB No. Used Action  LENS IOL TECNIS EYHANCE 19.5 - [id number] Intraocular Lens LENS IOL TECNIS EYHANCE 19.5 [id number] [facility]  Right 1 Implanted  RING CAPSULAR TYPE 14A PL RGHT - [id number] Miscellaneous RING CAPSULAR TYPE 14A PL [id number] [employer]  Right 1 Implanted         SURGEON:  Dene FABIENE Etienne, MD   ANESTHESIA:  Topical with tetracaine  drops and 2% Xylocaine  jelly, augmented with 1% preservative-free intracameral lidocaine .    COMPLICATIONS:  None.   DESCRIPTION OF PROCEDURE:  The patient was identified in the holding room and transported to the operating room and placed in the supine position under the operating microscope.  The right eye was identified as the operative eye and it was prepped and draped in the usual sterile ophthalmic fashion.   A 1 millimeter clear-corneal paracentesis was made at the 12:00 position.  0.5 ml of preservative-free 1% lidocaine  was injected into the anterior chamber. The anterior chamber was filled with Viscoat viscoelastic.  A 2.4 millimeter keratome was used to make a near-clear corneal incision at the 9:00 position.  A curvilinear capsulorrhexis was made with a cystotome and capsulorrhexis forceps.  Balanced salt  solution was used to hydrodissect and hydrodelineate the nucleus.   Phacoemulsification was then used in stop and chop fashion to remove the lens nucleus and epinucleus.  Zonular weakness was noted diffusely.   During removal of epinucleus, zonular dialysis was noted nasally for three to four clock hours.  Additional viscoelastic was placed and a capsular tension ring was injected into the capsule. The remaining cortex was then removed using the irrigation and aspiration handpiece. Provisc was then placed into the capsular bag to distend it for lens placement.  A lens was then injected into the capsular bag.  The remaining viscoelastic was aspirated.  The lens was centered and there was no vitreous presentation. Wounds were hydrated with balanced salt  solution.  The anterior chamber was inflated to a physiologic pressure with balanced salt  solution.  No wound leaks were noted. Cefuroxime  0.1 ml of a 10mg /ml solution was injected into the anterior chamber for a dose of 1 mg of intracameral antibiotic at the completion of the case.   Timolol  and Brimonidine  drops were applied to the eye.  The patient was taken to the recovery room in stable condition without complications of anesthesia or surgery.   Debra Hogan 12/10/2024, 9:38 AM

## 2024-12-10 NOTE — Anesthesia Postprocedure Evaluation (Signed)
 Anesthesia Post Note  Patient: Maiana Hennigan Margraf  Procedure(s) Performed: PHACOEMULSIFICATION, CATARACT, WITH IOL INSERTION 6.93, 01:01.4 (Right)  Patient location during evaluation: PACU Anesthesia Type: MAC Level of consciousness: awake and alert Pain management: pain level controlled Vital Signs Assessment: post-procedure vital signs reviewed and stable Respiratory status: spontaneous breathing, nonlabored ventilation, respiratory function stable and patient connected to nasal cannula oxygen  Cardiovascular status: stable and blood pressure returned to baseline Postop Assessment: no apparent nausea or vomiting Anesthetic complications: no   No notable events documented.   Last Vitals:  Vitals:   12/10/24 0940 12/10/24 0948  BP: 127/73 126/73  Pulse: 93 92  Resp: (!) 21 20  Temp: (!) 36.3 C (!) 36.3 C  SpO2: 97% 97%    Last Pain:  Vitals:   12/10/24 0948  TempSrc:   PainSc: 0-No pain                 Donny JAYSON Mu

## 2024-12-15 ENCOUNTER — Other Ambulatory Visit: Payer: Self-pay | Admitting: Student in an Organized Health Care Education/Training Program

## 2024-12-15 ENCOUNTER — Other Ambulatory Visit: Payer: Self-pay | Admitting: Family

## 2024-12-15 DIAGNOSIS — J449 Chronic obstructive pulmonary disease, unspecified: Secondary | ICD-10-CM

## 2024-12-15 DIAGNOSIS — F4320 Adjustment disorder, unspecified: Secondary | ICD-10-CM

## 2024-12-15 DIAGNOSIS — F419 Anxiety disorder, unspecified: Secondary | ICD-10-CM

## 2024-12-17 NOTE — Telephone Encounter (Signed)
 Call [facility]  I refilled  Tropium bromide 18 mch caps ; this was a duplicate as she is taking spiriva  respimat as refilled by pulmonology  Please cancel my prescription  Please call pt and ensure that she has spiriva  as managed by pulmonology

## 2024-12-17 NOTE — Addendum Note (Signed)
 Addended by: DINEEN ROLLENE MATSU on: 12/17/2024 05:16 PM   Modules accepted: Orders

## 2024-12-31 ENCOUNTER — Encounter: Admitting: Anesthesiology

## 2025-01-02 ENCOUNTER — Other Ambulatory Visit: Payer: Self-pay

## 2025-01-02 ENCOUNTER — Inpatient Hospital Stay

## 2025-01-02 ENCOUNTER — Inpatient Hospital Stay: Admission: EM | Admit: 2025-01-02 | Discharge: 2025-01-09 | DRG: 492 | Disposition: A | Discharge status: Home Health

## 2025-01-02 ENCOUNTER — Inpatient Hospital Stay: Admitting: General Practice

## 2025-01-02 ENCOUNTER — Encounter: Admission: EM | Disposition: A | Payer: Self-pay | Source: Home / Self Care | Attending: Family Medicine

## 2025-01-02 ENCOUNTER — Emergency Department

## 2025-01-02 DIAGNOSIS — E860 Dehydration: Secondary | ICD-10-CM | POA: Diagnosis present

## 2025-01-02 DIAGNOSIS — J9621 Acute and chronic respiratory failure with hypoxia: Secondary | ICD-10-CM | POA: Diagnosis present

## 2025-01-02 DIAGNOSIS — Z7982 Long term (current) use of aspirin: Secondary | ICD-10-CM

## 2025-01-02 DIAGNOSIS — F1120 Opioid dependence, uncomplicated: Secondary | ICD-10-CM | POA: Diagnosis present

## 2025-01-02 DIAGNOSIS — I110 Hypertensive heart disease with heart failure: Secondary | ICD-10-CM | POA: Diagnosis present

## 2025-01-02 DIAGNOSIS — F17210 Nicotine dependence, cigarettes, uncomplicated: Secondary | ICD-10-CM | POA: Diagnosis present

## 2025-01-02 DIAGNOSIS — J449 Chronic obstructive pulmonary disease, unspecified: Secondary | ICD-10-CM | POA: Insufficient documentation

## 2025-01-02 DIAGNOSIS — F32A Depression, unspecified: Secondary | ICD-10-CM | POA: Diagnosis present

## 2025-01-02 DIAGNOSIS — J9611 Chronic respiratory failure with hypoxia: Secondary | ICD-10-CM

## 2025-01-02 DIAGNOSIS — Z888 Allergy status to other drugs, medicaments and biological substances status: Secondary | ICD-10-CM

## 2025-01-02 DIAGNOSIS — F411 Generalized anxiety disorder: Secondary | ICD-10-CM | POA: Diagnosis present

## 2025-01-02 DIAGNOSIS — Z8249 Family history of ischemic heart disease and other diseases of the circulatory system: Secondary | ICD-10-CM

## 2025-01-02 DIAGNOSIS — I2721 Secondary pulmonary arterial hypertension: Secondary | ICD-10-CM | POA: Diagnosis present

## 2025-01-02 DIAGNOSIS — Z8261 Family history of arthritis: Secondary | ICD-10-CM

## 2025-01-02 DIAGNOSIS — S82851A Displaced trimalleolar fracture of right lower leg, initial encounter for closed fracture: Principal | ICD-10-CM | POA: Diagnosis present

## 2025-01-02 DIAGNOSIS — R0603 Acute respiratory distress: Secondary | ICD-10-CM | POA: Diagnosis not present

## 2025-01-02 DIAGNOSIS — X501XXA Overexertion from prolonged static or awkward postures, initial encounter: Secondary | ICD-10-CM

## 2025-01-02 DIAGNOSIS — S9304XA Dislocation of right ankle joint, initial encounter: Secondary | ICD-10-CM | POA: Diagnosis present

## 2025-01-02 DIAGNOSIS — G35D Multiple sclerosis, unspecified: Secondary | ICD-10-CM | POA: Diagnosis present

## 2025-01-02 DIAGNOSIS — I5032 Chronic diastolic (congestive) heart failure: Secondary | ICD-10-CM | POA: Diagnosis present

## 2025-01-02 DIAGNOSIS — Z8673 Personal history of transient ischemic attack (TIA), and cerebral infarction without residual deficits: Secondary | ICD-10-CM

## 2025-01-02 DIAGNOSIS — I252 Old myocardial infarction: Secondary | ICD-10-CM

## 2025-01-02 DIAGNOSIS — Z9981 Dependence on supplemental oxygen: Secondary | ICD-10-CM | POA: Diagnosis not present

## 2025-01-02 DIAGNOSIS — M19012 Primary osteoarthritis, left shoulder: Secondary | ICD-10-CM | POA: Diagnosis present

## 2025-01-02 DIAGNOSIS — J439 Emphysema, unspecified: Secondary | ICD-10-CM | POA: Diagnosis present

## 2025-01-02 DIAGNOSIS — K5900 Constipation, unspecified: Secondary | ICD-10-CM | POA: Diagnosis not present

## 2025-01-02 DIAGNOSIS — M5440 Lumbago with sciatica, unspecified side: Secondary | ICD-10-CM | POA: Diagnosis present

## 2025-01-02 DIAGNOSIS — G894 Chronic pain syndrome: Secondary | ICD-10-CM | POA: Diagnosis present

## 2025-01-02 DIAGNOSIS — E876 Hypokalemia: Secondary | ICD-10-CM | POA: Diagnosis present

## 2025-01-02 DIAGNOSIS — I700 Atherosclerosis of aorta: Secondary | ICD-10-CM | POA: Diagnosis present

## 2025-01-02 DIAGNOSIS — I3139 Other pericardial effusion (noninflammatory): Secondary | ICD-10-CM | POA: Diagnosis present

## 2025-01-02 DIAGNOSIS — Z90710 Acquired absence of both cervix and uterus: Secondary | ICD-10-CM

## 2025-01-02 DIAGNOSIS — I10 Essential (primary) hypertension: Secondary | ICD-10-CM | POA: Diagnosis not present

## 2025-01-02 DIAGNOSIS — Z8679 Personal history of other diseases of the circulatory system: Secondary | ICD-10-CM

## 2025-01-02 DIAGNOSIS — J441 Chronic obstructive pulmonary disease with (acute) exacerbation: Secondary | ICD-10-CM | POA: Diagnosis present

## 2025-01-02 DIAGNOSIS — Z791 Long term (current) use of non-steroidal anti-inflammatories (NSAID): Secondary | ICD-10-CM

## 2025-01-02 DIAGNOSIS — Z83438 Family history of other disorder of lipoprotein metabolism and other lipidemia: Secondary | ICD-10-CM

## 2025-01-02 DIAGNOSIS — Z66 Do not resuscitate: Secondary | ICD-10-CM | POA: Diagnosis present

## 2025-01-02 DIAGNOSIS — J9622 Acute and chronic respiratory failure with hypercapnia: Secondary | ICD-10-CM | POA: Diagnosis present

## 2025-01-02 DIAGNOSIS — E038 Other specified hypothyroidism: Secondary | ICD-10-CM | POA: Diagnosis present

## 2025-01-02 DIAGNOSIS — Z9049 Acquired absence of other specified parts of digestive tract: Secondary | ICD-10-CM

## 2025-01-02 DIAGNOSIS — I2510 Atherosclerotic heart disease of native coronary artery without angina pectoris: Secondary | ICD-10-CM | POA: Diagnosis present

## 2025-01-02 DIAGNOSIS — M797 Fibromyalgia: Secondary | ICD-10-CM | POA: Diagnosis present

## 2025-01-02 DIAGNOSIS — K589 Irritable bowel syndrome without diarrhea: Secondary | ICD-10-CM | POA: Diagnosis present

## 2025-01-02 DIAGNOSIS — Y92009 Unspecified place in unspecified non-institutional (private) residence as the place of occurrence of the external cause: Secondary | ICD-10-CM

## 2025-01-02 DIAGNOSIS — G4733 Obstructive sleep apnea (adult) (pediatric): Secondary | ICD-10-CM | POA: Diagnosis present

## 2025-01-02 DIAGNOSIS — Z59868 Other specified financial insecurity: Secondary | ICD-10-CM

## 2025-01-02 DIAGNOSIS — R296 Repeated falls: Secondary | ICD-10-CM | POA: Diagnosis present

## 2025-01-02 DIAGNOSIS — Z7951 Long term (current) use of inhaled steroids: Secondary | ICD-10-CM

## 2025-01-02 DIAGNOSIS — W010XXA Fall on same level from slipping, tripping and stumbling without subsequent striking against object, initial encounter: Secondary | ICD-10-CM | POA: Diagnosis present

## 2025-01-02 DIAGNOSIS — Z79899 Other long term (current) drug therapy: Secondary | ICD-10-CM

## 2025-01-02 DIAGNOSIS — E785 Hyperlipidemia, unspecified: Secondary | ICD-10-CM | POA: Diagnosis present

## 2025-01-02 DIAGNOSIS — J9620 Acute and chronic respiratory failure, unspecified whether with hypoxia or hypercapnia: Secondary | ICD-10-CM | POA: Diagnosis not present

## 2025-01-02 DIAGNOSIS — F419 Anxiety disorder, unspecified: Secondary | ICD-10-CM | POA: Diagnosis present

## 2025-01-02 DIAGNOSIS — M25571 Pain in right ankle and joints of right foot: Secondary | ICD-10-CM | POA: Diagnosis present

## 2025-01-02 DIAGNOSIS — Z634 Disappearance and death of family member: Secondary | ICD-10-CM

## 2025-01-02 DIAGNOSIS — I272 Pulmonary hypertension, unspecified: Secondary | ICD-10-CM | POA: Diagnosis not present

## 2025-01-02 DIAGNOSIS — M65941 Unspecified synovitis and tenosynovitis, right hand: Secondary | ICD-10-CM | POA: Diagnosis present

## 2025-01-02 HISTORY — PX: ANKLE CLOSED REDUCTION: SHX880

## 2025-01-02 LAB — BASIC METABOLIC PANEL WITH GFR
Anion gap: 6 (ref 5–15)
Anion gap: 6 (ref 5–15)
BUN: 18 mg/dL (ref 8–23)
BUN: 18 mg/dL (ref 8–23)
CO2: 41 mmol/L — ABNORMAL HIGH (ref 22–32)
CO2: 41 mmol/L — ABNORMAL HIGH (ref 22–32)
Calcium: 9.8 mg/dL (ref 8.9–10.3)
Calcium: 9.7 mg/dL (ref 8.9–10.3)
Chloride: 95 mmol/L — ABNORMAL LOW (ref 98–111)
Chloride: 96 mmol/L — ABNORMAL LOW (ref 98–111)
Creatinine, Ser: 0.87 mg/dL (ref 0.44–1.00)
Creatinine, Ser: 0.64 mg/dL (ref 0.44–1.00)
GFR, Estimated: >60 mL/min
GFR, Estimated: >60 mL/min
Glucose, Bld: 128 mg/dL — ABNORMAL HIGH (ref 70–99)
Glucose, Bld: 171 mg/dL — ABNORMAL HIGH (ref 70–99)
Potassium: 4 mmol/L (ref 3.5–5.1)
Potassium: 4 mmol/L (ref 3.5–5.1)
Sodium: 142 mmol/L (ref 135–145)
Sodium: 142 mmol/L (ref 135–145)

## 2025-01-02 LAB — CBC WITH DIFFERENTIAL/PLATELET
Abs Immature Granulocytes: 0.05 K/uL (ref 0.00–0.07)
Basophils Absolute: 0 K/uL (ref 0.0–0.1)
Basophils Relative: 0 %
Eosinophils Absolute: 0.1 K/uL (ref 0.0–0.5)
Eosinophils Relative: 1 %
HCT: 33.1 % — ABNORMAL LOW (ref 36.0–46.0)
Hemoglobin: 10.3 g/dL — ABNORMAL LOW (ref 12.0–15.0)
Immature Granulocytes: 1 %
Lymphocytes Relative: 8 %
Lymphs Abs: 0.8 K/uL (ref 0.7–4.0)
MCH: 31.9 pg (ref 26.0–34.0)
MCHC: 31.1 g/dL (ref 30.0–36.0)
MCV: 102.5 fL — ABNORMAL HIGH (ref 80.0–100.0)
Monocytes Absolute: 0.6 K/uL (ref 0.1–1.0)
Monocytes Relative: 6 %
Neutro Abs: 8.6 K/uL — ABNORMAL HIGH (ref 1.7–7.7)
Neutrophils Relative %: 84 %
Platelets: 194 K/uL (ref 150–400)
RBC: 3.23 MIL/uL — ABNORMAL LOW (ref 3.87–5.11)
RDW: 12.2 % (ref 11.5–15.5)
WBC: 10.1 K/uL (ref 4.0–10.5)
nRBC: 0 % (ref 0.0–0.2)

## 2025-01-02 LAB — CBC
HCT: 31.5 % — ABNORMAL LOW (ref 36.0–46.0)
Hemoglobin: 9.8 g/dL — ABNORMAL LOW (ref 12.0–15.0)
MCH: 31.7 pg (ref 26.0–34.0)
MCHC: 31.1 g/dL (ref 30.0–36.0)
MCV: 101.9 fL — ABNORMAL HIGH (ref 80.0–100.0)
Platelets: 174 K/uL (ref 150–400)
RBC: 3.09 MIL/uL — ABNORMAL LOW (ref 3.87–5.11)
RDW: 12.1 % (ref 11.5–15.5)
WBC: 9.4 K/uL (ref 4.0–10.5)
nRBC: 0 % (ref 0.0–0.2)

## 2025-01-02 LAB — BLOOD GAS, ARTERIAL
Acid-Base Excess: 23.1 mmol/L — ABNORMAL HIGH (ref 0.0–2.0)
Bicarbonate: 51.2 mmol/L — ABNORMAL HIGH (ref 20.0–28.0)
FIO2: 0.44 %
O2 Content: 6 L/min
O2 Saturation: 94.2 %
Patient temperature: 37
pCO2 arterial: 79 mmHg (ref 32–48)
pH, Arterial: 7.42 (ref 7.35–7.45)
pO2, Arterial: 58 mmHg — ABNORMAL LOW (ref 83–108)

## 2025-01-02 LAB — HIV ANTIBODY (ROUTINE TESTING W REFLEX): HIV Screen 4th Generation wRfx: NONREACTIVE

## 2025-01-02 MED ORDER — MORPHINE SULFATE (PF) 4 MG/ML IV SOLN
4.0000 mg | Freq: Once | INTRAVENOUS | Status: AC
  Administered 2025-01-02: 4 mg via INTRAVENOUS
  Filled 2025-01-02: qty 1

## 2025-01-02 MED ORDER — ETOMIDATE 2 MG/ML IV SOLN
6.0000 mg | Freq: Once | INTRAVENOUS | Status: AC
  Administered 2025-01-02: 6 mg via INTRAVENOUS
  Filled 2025-01-02: qty 10

## 2025-01-02 MED ORDER — IPRATROPIUM-ALBUTEROL 0.5-2.5 (3) MG/3ML IN SOLN
3.0000 mL | Freq: Once | RESPIRATORY_TRACT | Status: AC
  Administered 2025-01-02: 3 mL via RESPIRATORY_TRACT

## 2025-01-02 MED ORDER — BUPIVACAINE HCL (PF) 0.5 % IJ SOLN
INTRAMUSCULAR | Status: DC | PRN
  Administered 2025-01-02: 20 mL

## 2025-01-02 MED ORDER — ONDANSETRON HCL 4 MG/2ML IJ SOLN
4.0000 mg | INTRAMUSCULAR | Status: AC
  Administered 2025-01-02: 4 mg via INTRAVENOUS
  Filled 2025-01-02: qty 2

## 2025-01-02 MED ORDER — KETAMINE HCL 50 MG/5ML IJ SOSY
PREFILLED_SYRINGE | INTRAMUSCULAR | Status: DC | PRN
  Administered 2025-01-02: 20 mg via INTRAVENOUS
  Administered 2025-01-02: 10 mg via INTRAVENOUS

## 2025-01-02 MED ORDER — KETAMINE HCL 50 MG/5ML IJ SOSY
PREFILLED_SYRINGE | INTRAMUSCULAR | Status: AC
  Filled 2025-01-02: qty 5

## 2025-01-02 MED ORDER — ONDANSETRON HCL 4 MG PO TABS: 4.0000 mg | ORAL_TABLET | Freq: Four times a day (QID) | ORAL | Status: DC | PRN

## 2025-01-02 MED ORDER — SODIUM CHLORIDE 0.9 % IV SOLN: Freq: Once | INTRAVENOUS | Status: AC

## 2025-01-02 MED ORDER — ALPRAZOLAM 0.5 MG PO TABS
ORAL_TABLET | ORAL | Status: AC
  Filled 2025-01-02: qty 1

## 2025-01-02 MED ORDER — SODIUM CHLORIDE (PF) 0.9 % IJ SOLN
INTRAMUSCULAR | Status: AC
  Filled 2025-01-02: qty 50

## 2025-01-02 MED ORDER — IPRATROPIUM-ALBUTEROL 0.5-2.5 (3) MG/3ML IN SOLN
RESPIRATORY_TRACT | Status: AC
  Filled 2025-01-02: qty 3

## 2025-01-02 MED ORDER — BUPIVACAINE HCL (PF) 0.5 % IJ SOLN
INTRAMUSCULAR | Status: AC
  Filled 2025-01-02: qty 30

## 2025-01-02 MED ORDER — KETAMINE HCL 10 MG/ML IJ SOLN
20.0000 mg | Freq: Once | INTRAMUSCULAR | Status: AC
  Administered 2025-01-02: 20 mg via INTRAVENOUS

## 2025-01-02 MED ORDER — LISINOPRIL 20 MG PO TABS
40.0000 mg | ORAL_TABLET | Freq: Every day | ORAL | Status: DC
  Administered 2025-01-03 – 2025-01-09 (×7): 40 mg via ORAL
  Filled 2025-01-02 (×7): qty 2

## 2025-01-02 MED ORDER — ESCITALOPRAM OXALATE 10 MG PO TABS
20.0000 mg | ORAL_TABLET | Freq: Every day | ORAL | Status: DC
  Administered 2025-01-03 – 2025-01-09 (×7): 20 mg via ORAL
  Filled 2025-01-02 (×7): qty 2

## 2025-01-02 MED ORDER — CYCLOBENZAPRINE HCL 10 MG PO TABS
10.0000 mg | ORAL_TABLET | Freq: Every day | ORAL | Status: DC
  Administered 2025-01-02 – 2025-01-08 (×7): 10 mg via ORAL
  Filled 2025-01-02 (×7): qty 1

## 2025-01-02 MED ORDER — BUPIVACAINE HCL (PF) 0.5 % IJ SOLN
INTRAMUSCULAR | Status: AC
  Filled 2025-01-02: qty 20

## 2025-01-02 MED ORDER — METHYLPREDNISOLONE SODIUM SUCC 125 MG IJ SOLR
INTRAMUSCULAR | Status: AC
  Filled 2025-01-02: qty 2

## 2025-01-02 MED ORDER — MIDAZOLAM HCL 2 MG/2ML IJ SOLN
INTRAMUSCULAR | Status: AC
  Filled 2025-01-02: qty 2

## 2025-01-02 MED ORDER — ADULT MULTIVITAMIN W/MINERALS CH
1.0000 | ORAL_TABLET | Freq: Every day | ORAL | Status: DC
  Administered 2025-01-03 – 2025-01-09 (×7): 1 via ORAL
  Filled 2025-01-02 (×7): qty 1

## 2025-01-02 MED ORDER — LIDOCAINE HCL (PF) 1 % IJ SOLN
INTRAMUSCULAR | Status: AC
  Filled 2025-01-02: qty 30

## 2025-01-02 MED ORDER — FLUTICASONE FUROATE-VILANTEROL 200-25 MCG/ACT IN AEPB
1.0000 | INHALATION_SPRAY | Freq: Every day | RESPIRATORY_TRACT | Status: DC
  Administered 2025-01-02 – 2025-01-09 (×7): 1 via RESPIRATORY_TRACT
  Filled 2025-01-02: qty 28

## 2025-01-02 MED ORDER — BUPIVACAINE LIPOSOME 1.3 % IJ SUSP
INTRAMUSCULAR | Status: DC | PRN
  Administered 2025-01-02: 10 mL

## 2025-01-02 MED ORDER — UMECLIDINIUM BROMIDE 62.5 MCG/ACT IN AEPB
2.0000 | INHALATION_SPRAY | Freq: Every day | RESPIRATORY_TRACT | Status: DC
  Administered 2025-01-04 – 2025-01-09 (×5): 2 via RESPIRATORY_TRACT
  Filled 2025-01-02 (×2): qty 7

## 2025-01-02 MED ORDER — VITAMIN D 25 MCG (1000 UNIT) PO TABS
1000.0000 [IU] | ORAL_TABLET | Freq: Every day | ORAL | Status: DC
  Administered 2025-01-03 – 2025-01-09 (×7): 1000 [IU] via ORAL
  Filled 2025-01-02 (×7): qty 1

## 2025-01-02 MED ORDER — MORPHINE SULFATE (PF) 2 MG/ML IV SOLN
2.0000 mg | INTRAVENOUS | Status: DC | PRN
  Administered 2025-01-02 – 2025-01-08 (×13): 2 mg via INTRAVENOUS
  Filled 2025-01-02 (×13): qty 1

## 2025-01-02 MED ORDER — CEFAZOLIN SODIUM-DEXTROSE 2-4 GM/100ML-% IV SOLN: 2.0000 g | INTRAVENOUS | Status: DC

## 2025-01-02 MED ORDER — MAGNESIUM HYDROXIDE 400 MG/5ML PO SUSP
30.0000 mL | Freq: Every day | ORAL | Status: DC | PRN
  Administered 2025-01-09: 30 mL via ORAL
  Filled 2025-01-02: qty 30

## 2025-01-02 MED ORDER — BUPIVACAINE LIPOSOME 1.3 % IJ SUSP
INTRAMUSCULAR | Status: AC
  Filled 2025-01-02: qty 20

## 2025-01-02 MED ORDER — NITROGLYCERIN 0.4 MG SL SUBL
0.4000 mg | SUBLINGUAL_TABLET | SUBLINGUAL | Status: DC | PRN
  Filled 2025-01-02: qty 1

## 2025-01-02 MED ORDER — BUPIVACAINE LIPOSOME 1.3 % IJ SUSP
INTRAMUSCULAR | Status: AC
  Filled 2025-01-02: qty 10

## 2025-01-02 MED ORDER — DILTIAZEM HCL ER COATED BEADS 180 MG PO CP24
180.0000 mg | ORAL_CAPSULE | Freq: Every day | ORAL | Status: DC
  Administered 2025-01-03 – 2025-01-09 (×7): 180 mg via ORAL
  Filled 2025-01-02 (×8): qty 1

## 2025-01-02 MED ORDER — MIDAZOLAM HCL (PF) 2 MG/2ML IJ SOLN
1.0000 mg | Freq: Once | INTRAMUSCULAR | Status: AC
  Administered 2025-01-02: 1 mg via INTRAVENOUS

## 2025-01-02 MED ORDER — IPRATROPIUM-ALBUTEROL 0.5-2.5 (3) MG/3ML IN SOLN
3.0000 mL | Freq: Four times a day (QID) | RESPIRATORY_TRACT | Status: DC
  Administered 2025-01-02 – 2025-01-03 (×3): 3 mL via RESPIRATORY_TRACT
  Filled 2025-01-02 (×3): qty 3

## 2025-01-02 MED ORDER — SODIUM CHLORIDE 0.9 % IV SOLN: INTRAVENOUS | Status: AC

## 2025-01-02 MED ORDER — METHYLPREDNISOLONE SODIUM SUCC 125 MG IJ SOLR
80.0000 mg | Freq: Two times a day (BID) | INTRAMUSCULAR | Status: DC
  Administered 2025-01-03 – 2025-01-06 (×8): 80 mg via INTRAVENOUS
  Filled 2025-01-02 (×8): qty 2

## 2025-01-02 MED ORDER — ALPRAZOLAM 0.25 MG PO TABS
0.2500 mg | ORAL_TABLET | Freq: Two times a day (BID) | ORAL | Status: DC | PRN
  Administered 2025-01-02 – 2025-01-08 (×6): 0.25 mg via ORAL
  Filled 2025-01-02 (×5): qty 1

## 2025-01-02 MED ORDER — TRAZODONE HCL 50 MG PO TABS
25.0000 mg | ORAL_TABLET | Freq: Every evening | ORAL | Status: DC | PRN
  Administered 2025-01-03: 25 mg via ORAL
  Filled 2025-01-02: qty 1

## 2025-01-02 MED ORDER — METHYLPREDNISOLONE SODIUM SUCC 125 MG IJ SOLR
125.0000 mg | Freq: Once | INTRAMUSCULAR | Status: AC
  Administered 2025-01-02: 125 mg via INTRAVENOUS

## 2025-01-02 MED ORDER — ROSUVASTATIN CALCIUM 20 MG PO TABS
40.0000 mg | ORAL_TABLET | Freq: Every day | ORAL | Status: DC
  Administered 2025-01-03 – 2025-01-09 (×7): 40 mg via ORAL
  Filled 2025-01-02 (×2): qty 4
  Filled 2025-01-02: qty 2
  Filled 2025-01-02: qty 4
  Filled 2025-01-02: qty 2
  Filled 2025-01-02 (×2): qty 4

## 2025-01-02 MED ORDER — ISOSORBIDE MONONITRATE ER 60 MG PO TB24
60.0000 mg | ORAL_TABLET | Freq: Every day | ORAL | Status: DC
  Administered 2025-01-03 – 2025-01-09 (×7): 60 mg via ORAL
  Filled 2025-01-02: qty 1
  Filled 2025-01-02 (×2): qty 2
  Filled 2025-01-02 (×4): qty 1

## 2025-01-02 MED ORDER — ONDANSETRON HCL 4 MG/2ML IJ SOLN
4.0000 mg | Freq: Four times a day (QID) | INTRAMUSCULAR | Status: DC | PRN
  Administered 2025-01-08: 4 mg via INTRAVENOUS
  Filled 2025-01-02: qty 2

## 2025-01-02 MED ORDER — ACETAMINOPHEN 325 MG PO TABS
650.0000 mg | ORAL_TABLET | Freq: Four times a day (QID) | ORAL | Status: DC | PRN
  Administered 2025-01-04 – 2025-01-07 (×2): 650 mg via ORAL
  Filled 2025-01-02 (×2): qty 2

## 2025-01-02 MED ORDER — GABAPENTIN 100 MG PO CAPS
100.0000 mg | ORAL_CAPSULE | Freq: Three times a day (TID) | ORAL | Status: DC
  Administered 2025-01-02 – 2025-01-09 (×21): 100 mg via ORAL
  Filled 2025-01-02 (×21): qty 1

## 2025-01-02 MED ORDER — ACETAMINOPHEN 650 MG RE SUPP: 650.0000 mg | Freq: Four times a day (QID) | RECTAL | Status: DC | PRN

## 2025-01-02 MED ORDER — FUROSEMIDE 10 MG/ML IJ SOLN
INTRAMUSCULAR | Status: AC
  Filled 2025-01-02: qty 2

## 2025-01-02 NOTE — H&P (Addendum)
 HISTORY AND PHYSICAL INTERVAL NOTE:  01/02/2025  7:28 AM  Debra Hogan  has presented today for surgery, with the diagnosis of right trimal ankle fracture.  The various methods of treatment have been discussed with the patient.  No guarantees were given.  After consideration of risks, benefits and other options for treatment, the patient has consented to surgery.  I have reviewed the patients chart and labs.    PROCEDURE: RIGHT TRIMAL ANKLE FRACTURE ORIF   A history and physical examination was performed in the hospital.  The patient was reexamined.    The patient's oxygen  saturation was in the 70s when she got into the preoperative area.  Plan to delay surgery for now until patient's oxygen  saturation is improved.  If patient remains unimproved despite breathing treatments then recommended the patient and the staff to go ahead and perform popliteal and saphenous nerve block and takeback for closed reduction with splinting as ankle is still dislocated despite ER efforts to reduce it.  Pate be difficult to reduce due to complex nature of fracture.  If O2 sat can be improved with breathing treatment then we will go ahead and takeback for definitive fixation and treatment.  Patient remains at high risk for postoperative complications and intraoperative complications in current state.  Procedure is considered emergent due to ankle fracture and skin integrity being compromised to the medial ankle.  Prentice Lee, DPM
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
## 2025-01-02 NOTE — Assessment & Plan Note (Signed)
-   Will continue statin therapy.  That should provide perioperative cardiovascular risk reduction.

## 2025-01-02 NOTE — Assessment & Plan Note (Addendum)
-   The patient will be admitted to the medical-surgical bed. - This was associated with tibiotalar dislocation.  She had a reduction under sedation in the ED for her dislocation - Pain management will be provided. - She will be kept NPO. - Podiatry consult to be obtained. - Dr. Lennie was notified about the patient. - She has no history of CHF, CVA, diabetes mellitus on insulin, coronary artery disease or renal failure with creatinine more than 2.  She is considered at average risk for her age for perioperative cardiovascular events per the revised cardiac risk index.

## 2025-01-02 NOTE — Progress Notes (Signed)
 Contacted ICU, says she does not meet stepdown criteria, notified Dr. Leesa, order changed to progressive care, solumedrol administered as ordered.

## 2025-01-02 NOTE — Progress Notes (Signed)
" °   01/02/25 1532  Assess: MEWS Score  Temp 98.6 F (37 C)  BP (!) 98/49  MAP (mmHg) (!) 63  Pulse Rate (!) 117  ECG Heart Rate (!) 114  Resp (!) 22  Level of Consciousness Alert  SpO2 92 %  O2 Device Nasal Cannula  O2 Flow Rate (L/min) 6 L/min  Assess: MEWS Score  MEWS Temp 0  MEWS Systolic 1  MEWS Pulse 2  MEWS RR 1  MEWS LOC 0  MEWS Score 4  MEWS Score Color Red  Assess: if the MEWS score is Yellow or Red  Were vital signs accurate and taken at a resting state? Yes  Does the patient meet 2 or more of the SIRS criteria? No  MEWS guidelines implemented  Yes, red  Treat  MEWS Interventions Considered administering scheduled or prn medications/treatments as ordered  Take Vital Signs  Increase Vital Sign Frequency  Red: Q1hr x2, continue Q4hrs until patient remains green for 12hrs  Escalate  MEWS: Escalate Red: Discuss with charge nurse and notify provider. Consider notifying RRT. If remains red for 2 hours consider need for higher level of care  Notify: Charge Nurse/RN  Name of Charge Nurse/RN Notified Norleen Pander  Provider Notification  Provider Name/Title Lorane Poland  Date Provider Notified 01/02/25  Time Provider Notified 1532  Method of Notification Page  Notification Reason Critical Result (CO2 79, Hyoptensive and Tachycardic)  Provider response At bedside  Date of Provider Response 01/02/25  Time of Provider Response 1533  Assess: SIRS CRITERIA  SIRS Temperature  0  SIRS Respirations  1  SIRS Pulse 1  SIRS WBC 0  SIRS Score Sum  2    "

## 2025-01-02 NOTE — Progress Notes (Signed)
 Report received from Powell Rung, PACU RN. Per Powell, patient alert and oriented x4. This RN assessed that patient was only alert to self and place, otherwise confused. Patient moving all extremities and following commands. Most recent BP reading is 98/49 with a MAP of 63. Heart rate 117. No complaints of pain. Dr Leesa made aware of above conditions.

## 2025-01-02 NOTE — Assessment & Plan Note (Signed)
-   Will continue her inhalers.
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
## 2025-01-02 NOTE — Anesthesia Procedure Notes (Signed)
 Anesthesia Regional Block: Adductor canal block   Pre-Anesthetic Checklist: , timeout performed,  Correct Patient, Correct Site, Correct Laterality,  Correct Procedure, Correct Position, site marked,  Risks and benefits discussed,  Surgical consent,  Pre-op evaluation,  At surgeon's request and post-op pain management  Laterality: Lower and Right  Prep: chloraprep       Needles:  Injection technique: Single-shot  Needle Type: Echogenic Needle     Needle Length: 9cm  Needle Gauge: 21     Additional Needles:   Procedures:,,,, ultrasound used (permanent image in chart),,    Narrative:  Start time: 01/02/2025 8:46 AM End time: 01/02/2025 8:49 AM Injection made incrementally with aspirations every 5 mL.  Performed by: Personally  Anesthesiologist: Leavy Ned, MD  Additional Notes: Patient's chart reviewed and they were deemed appropriate candidate for procedure, per surgeon's request. Patient educated about risks, benefits, and alternatives of the block including but not limited to: temporary or permanent nerve damage, bleeding, infection, damage to surround tissues, block failure, local anesthetic toxicity. Patient expressed understanding. A formal time-out was conducted consistent with institution rules.  Monitors were applied, and minimal sedation used (see nursing record). The site was prepped with skin prep and allowed to dry, and sterile gloves were used. A high frequency linear ultrasound probe with probe cover was utilized throughout. Femoral artery visualized at mid-thigh level, local anesthetic injected anterolateral to it, and echogenic block needle trajectory was monitored throughout. Hydrodissection of saphenous nerve visualized and appeared anatomically normal. Aspiration performed every 5ml. Blood vessels were avoided. All injections were performed without resistance and free of blood and paresthesias. The patient tolerated the procedure well. A picture of the nerve block  was added to the patient's chart.  Injectate: 10ml of 0.5% bupivacaine  and 5ml of exparel 

## 2025-01-02 NOTE — Assessment & Plan Note (Signed)
-   Will continue antihypertensive therapy.

## 2025-01-02 NOTE — Plan of Care (Signed)
   Problem: Education: Goal: Knowledge of General Education information will improve Description Including pain rating scale, medication(s)/side effects and non-pharmacologic comfort measures Outcome: Progressing

## 2025-01-02 NOTE — Op Note (Signed)
 PODIATRY / FOOT AND ANKLE SURGERY OPERATIVE REPORT    SURGEON: Prentice Lee, DPM  PRE-OPERATIVE DIAGNOSIS:  1.  Closed displaced comminuted right trimalleolar ankle fracture  POST-OPERATIVE DIAGNOSIS: Same  PROCEDURE(S): Closed reduction right trimalleolar ankle fracture Application of posterior splint and sugar-tong splint Use of intraoperative fluoroscopy  HEMOSTASIS: No tourniquet  ANESTHESIA: local  FINDING(S): 1.  Comminuted fracture right ankle, trimalleolar  PATHOLOGY/SPECIMEN(S): None  INDICATIONS:   Debra Hogan is a 64 y.o. female who presents with an injury to the right ankle after a fall.  She went to the emergency room afterwards and was diagnosed with a displaced trimalleolar ankle fracture.  She had attempted closed reduction performed by ER staff but was unsuccessful.  CT imaging showed comminuted trimalleolar fracture with notable displacement.  All treatment options were discussed with the patient both surgical and conservative attempts at correction including all risks and complications.  While patient was in preop area she had a very low oxygen  saturation so anesthesia determined that the surgery would not be safe to perform at this time.  Discussed with anesthesia that at minimum patient needs close reduction and if not able to be reduced and she would need ORIF due to skin tenting and concern for necrosis of the medial ankle skin.  Patient agreeable with plan once discussed.  Declared as emergent surgery/reduction.  All questions answered.  No guarantees given.  Patient is at risk for postop complications due to multiple comorbidities.  DESCRIPTION: After obtaining full informed written consent, the patient was brought back to the operating room and placed supine upon the operating table.  After obtaining some degree of conscious sedation, the posterior splint and sugar-tong splint was removed and passed off from the table.  There appeared to be some evidence of the  start of some tissue changes and necrosis to the medial ankle where the medial malleolar fracture fragment had displaced and the tibia was pushing into the skin.  There did not appear to be any openings though at this time.  Under fluoroscopic guidance the ankle deformity was then exaggerated, distracted, and reduced.  Fluoroscopic imaging was used to verify correction.  Overall the ankle appeared to be in a more stable position at this time with the medial malleolus relocated and the fibula being pulled out to length but still had some slight malalignment.  Overall the positioning appeared to be much improved.  While holding the foot in a rectus position and maintaining the alignment 4 x 4 gauze was placed around the ankle followed by Kerlix, Webril, posterior splint, sugar-tong splint, Ace wrap.  This was held while the splint dried.  Final C-arm imaging was then taken showing once again correction of the ankle fracture which appeared to be in a more reduced position and more near anatomic.  There did not appear to be any further skin tenting to the medial ankle.  The patient tolerated the procedure and anesthesia well and was transferred to the recovery room with vital signs stable vascular status appeared to be intact all toes of both feet.  Following a period of postoperative monitoring the patient be discharged back to the inpatient room with the appropriate orders, instructions, medications.  Patient will need to be stabilized further and will need to be optimized for subsequent open reduction with internal fixation later on, potentially could be next week.  Will follow more peripherally at this time and likely reevaluate patient tomorrow.  COMPLICATIONS: None  CONDITION: Stable  Prentice Lee, DPM

## 2025-01-02 NOTE — Anesthesia Procedure Notes (Signed)
 Anesthesia Regional Block: Popliteal block   Pre-Anesthetic Checklist: , timeout performed,  Correct Patient, Correct Site, Correct Laterality,  Correct Procedure, Correct Position, site marked,  Risks and benefits discussed,  Surgical consent,  Pre-op evaluation,  At surgeon's request and post-op pain management  Laterality: Right and Left  Prep: chloraprep       Needles:  Injection technique: Single-shot  Needle Type: Other     Needle Length: 4cm  Needle Gauge: 25     Additional Needles:   Procedures:,,,, ultrasound used (permanent image in chart),,    Narrative:  Start time: 01/02/2025 8:49 AM End time: 01/02/2025 8:52 AM Injection made incrementally with aspirations every 5 mL.  Performed by: Personally  Anesthesiologist: Leavy Ned, MD  Additional Notes: Patient's chart reviewed and they were deemed appropriate candidate for procedure, per surgeon's request. Patient educated about risks, benefits, and alternatives of the block including but not limited to: temporary or permanent nerve damage, bleeding, infection, damage to surround tissues, intestinal perforation, block failure, local anesthetic toxicity. They expressed understanding. A formal time-out was conducted after induction of anesthesia consistent with institution rules.  Full monitors utilized. The site was prepped with skin prep and allowed to dry, and sterile gloves were used. A high frequency linear ultrasound probe with probe cover was utilized throughout. External oblique, internal oblique, and transversus abdominus muscles visualized and appeared anatomically normal. Aspiration performed every 5ml. Blood vessels and peritoneum were avoided. All injections were performed without resistance and free of blood and paresthesias. The patient tolerated the procedure well.  Injectate: 5ml exparel  + 10ml 0.5% bupivacaine 

## 2025-01-02 NOTE — ED Notes (Signed)
 Patient transported to CT

## 2025-01-02 NOTE — ED Notes (Signed)
Patient resting in bed free from sign of distress. Breathing unlabored speaking in full sentences with symmetric chest rise and fall. Bed low and locked with side rails raised x2. Call bell in reach and monitor in place. Spouse at bedside.

## 2025-01-02 NOTE — H&P (Signed)
 "        PATIENT NAME: Debra Hogan    MR#:  [id number]  DATE OF BIRTH:  [birth year]  DATE OF ADMISSION:  01/02/2025  PRIMARY CARE PHYSICIAN: Dineen Rollene MATSU, FNP   Patient is coming from: Home  REQUESTING/REFERRING PHYSICIAN: Gordan Huxley, MD  CHIEF COMPLAINT:   Chief Complaint  Patient presents with   Ankle Pain    HISTORY OF PRESENT ILLNESS:  Debra Hogan is a 64 y.o. Caucasian female with medical history significant for MS and chronic respiratory failure on home O2 at baseline, who presented to the emergency room with acute onset of mechanical falls associated with her MS.  She stated that she tripped and fell rolling her right ankle with subsequent pain and deformity.  She denied any presyncope or syncope.  No paresthesias or focal muscle weakness.  She denied any head or neck injuries.  No chest pain or palpitations.  No cough or wheezing or hemoptysis.  No bleeding diathesis.  No dysuria, oliguria or hematuria or flank pain.  She has a right hand wrist due to old synovitis.  ED Course: When she came to the ER vital signs were within normal.  Labs revealed chloride of 95 and CO2 of 41 with otherwise unremarkable BMP.  CBC showed hemoglobin 10.3 hematocrit 33.1 EKG as reviewed by me : None. Imaging: 2 view right ankle x-ray showed suspected trimalleolar fracture and associated tibiotalar dislocation/subluxation.  CT of the right ankle without contrast is currently pending.  The patient was given 4 mg IV morphine  sulfate and 4 mg of IV Zofran  and was started on IV hydration with normal saline at 100 mL/h.  She was given etomidate  6 mg IV and underwent reduction of her dislocation in the ER.  Dr. Lennie was notified about patient.  She will be kept NPO.  She will be admitted to a medical-surgical bed for further evaluation and management. PAST MEDICAL HISTORY:   Past Medical History:  Diagnosis Date   Anginal pain    Anxiety    Anxiety and depression    Arthritis     Asthma    Atherosclerosis of aorta    Cerebral aneurysm    Chronic bronchitis (HCC)    Chronic heart failure with preserved ejection fraction (HFpEF) (HCC)    a. 10/2023 Echo: EF 65-70%, GrI DD; b. 05/2024 Echo: EF 60-65%, no rwma, nl RV size/fxn, small circumferential pericardial effusion.  No significant valvular disease.   Chronic hypoxic respiratory failure, on home oxygen  therapy (HCC)    Chronic low back pain with sciatica    Chronic pain syndrome    Chronic, continuous use of opioids    COPD, very severe (HCC)    Coronary artery disease - non-obstructive    a. 09/2019 Cath: LM nl, LAD nl, LCX nl, RCA 40p (vasospasm noted ostial RCA). EF 65%-->ccb started.   Coronary vasospasm    a. 09/2019 Cath: ost RCA vasospasm-->ccb started.   DDD (degenerative disc disease), cervical    DDD (degenerative disc disease), lumbar    Depression    Dyspnea    emphasema   Fatigue    Fibromyalgia    Generalized anxiety disorder    Headache    Heat stroke 2006   Hypertension    Hypertension    Irritable bowel syndrome    MS (multiple sclerosis)    Followed by Dr. Maree   Myalgia    Myocardial infarction [facility])    Myositis    Neuropathic pain  On supplemental oxygen  therapy    on 3L    Osteoarthritis of left shoulder 01/12/2022   PAH (pulmonary artery hypertension) (HCC)    a. 09/2019 RHC: RA 12, RV 42/14, PA 38/18 (27), PCWP 14. CO 4.6 L/min.   Pericardial effusion    a. 10/2023 Echo: small circumferential pericardial effusion without tamponade; b.  05/2024 Echo: Small circumferential pericardial effusion 1 cm of the RV and LV free wall.   Right-sided thoracic back pain    Sleep apnea (untreated)    Subclinical hypothyroidism    Tobacco abuse     PAST SURGICAL HISTORY:   Past Surgical History:  Procedure Laterality Date   ABDOMINAL HYSTERECTOMY     CERVIX intact; had for noncancerous reasons, endometriosis   CATARACT EXTRACTION W/PHACO Left 08/27/2024   Procedure:  PHACOEMULSIFICATION, CATARACT, WITH IOL INSERTION 2.85, 00:19.0;  Surgeon: Mittie Gaskin, MD;  Location: [facility];  Service: Ophthalmology;  Laterality: Left;   CATARACT EXTRACTION W/PHACO Right 12/10/2024   Procedure: PHACOEMULSIFICATION, CATARACT, WITH IOL INSERTION 6.93, 01:01.4;  Surgeon: Mittie Gaskin, MD;  Location: [facility];  Service: Ophthalmology;  Laterality: Right;   CHOLECYSTECTOMY N/A 01/03/2016   Procedure: LAPAROSCOPIC CHOLECYSTECTOMY WITH INTRAOPERATIVE CHOLANGIOGRAM;  Surgeon: Charlie FORBES Fell, MD;  Location: [facility];  Service: General;  Laterality: N/A;   ENDOSCOPIC RETROGRADE CHOLANGIOPANCREATOGRAPHY (ERCP) WITH PROPOFOL  N/A 01/04/2016   Procedure: ENDOSCOPIC RETROGRADE CHOLANGIOPANCREATOGRAPHY (ERCP) WITH PROPOFOL ;  Surgeon: Rogelia Copping, MD;  Location: [facility];  Service: Endoscopy;  Laterality: N/A;   FOOT SURGERY  2001   LAPAROSCOPIC ENDOMETRIOSIS FULGURATION     RIGHT/LEFT HEART CATH AND CORONARY ANGIOGRAPHY Bilateral 09/29/2019   Procedure: RIGHT/LEFT HEART CATH AND CORONARY ANGIOGRAPHY;  Surgeon: Mady Bruckner, MD;  Location: [facility];  Service: Cardiovascular;  Laterality: Bilateral;    SOCIAL HISTORY:   Social History   Tobacco Use   Smoking status: Every Day    Current packs/day: 1.00    Average packs/day: 1 pack/day for 53.0 years (53.0 ttl pk-yrs)    Types: Cigarettes    Start date: 1973   Smokeless tobacco: Never   Tobacco comments:    0.5 PPD- khj 09/29/2024  Substance Use Topics   Alcohol use: No    Alcohol/week: 0.0 standard drinks of alcohol    FAMILY HISTORY:   Family History  Problem Relation Age of Onset   Hypertension Mother    Hyperlipidemia Mother    Arthritis Mother    Cancer Father        throat   Heart disease Brother    Bipolar disorder Son    Schizophrenia Son    Cancer Maternal Grandfather        liver   Cancer Paternal Grandmother        liver    DRUG ALLERGIES:   Allergies[1]  REVIEW OF SYSTEMS:   ROS As per history of present illness. All pertinent systems were reviewed above. Constitutional, HEENT, cardiovascular, respiratory, GI, GU, musculoskeletal, neuro, psychiatric, endocrine, integumentary and hematologic systems were reviewed and are otherwise negative/unremarkable except for positive findings mentioned above in the HPI.   MEDICATIONS AT HOME:   Prior to Admission medications  Medication Sig Start Date End Date Taking? Authorizing Provider  acyclovir  (ZOVIRAX ) 400 MG tablet Take 1 tablet (400 mg total) by mouth daily. 04/29/24   Dineen Rollene MATSU, FNP  Albuterol  Sulfate (PROAIR  RESPICLICK) 108 (90 Base) MCG/ACT AEPB INHALE 1 PUFF BY MOUTH EVERY 6 HOURS AS NEEDED FOR COUGH 12/17/24   Arnett,  Rollene MATSU, FNP  ALPRAZolam  (XANAX ) 0.25 MG tablet Take 1 tablet by mouth twice daily as needed for anxiety 12/17/24   Dineen Rollene MATSU, FNP  aspirin  EC 81 MG tablet Take 81 mg by mouth daily.    [provider]  cholecalciferol  (VITAMIN D ) 1000 units tablet Take 1,000 Units by mouth daily.    [provider]  cyclobenzaprine  (FLEXERIL ) 10 MG tablet Take 10 mg by mouth at bedtime. 10/14/24   [provider]  cycloSPORINE (RESTASIS) 0.05 % ophthalmic emulsion 1 drop 2 (two) times daily. 11/21/23   [provider]  diltiazem  (CARDIZEM  CD) 180 MG 24 hr capsule TAKE 1 CAPSULE BY MOUTH ONCE DAILY 04/29/24   Dineen Rollene MATSU, FNP  escitalopram  (LEXAPRO ) 20 MG tablet Take 1 tablet (20 mg total) by mouth daily. 04/29/24   Dineen Rollene MATSU, FNP  fluticasone -salmeterol (ADVAIR) 250-50 MCG/ACT AEPB Inhale 1 puff into the lungs in the morning and at bedtime. 09/29/24   Isadora Hose, MD  gabapentin  (NEURONTIN ) 100 MG capsule Take 1 capsule (100 mg total) by mouth 3 (three) times daily. 04/29/24   Dineen Rollene MATSU, FNP  isosorbide  mononitrate (IMDUR ) 60 MG 24 hr tablet Take 1 tablet (60 mg total) by mouth daily. 10/02/24   Vivienne Lonni Ingle, NP  lisinopril  (ZESTRIL ) 40 MG tablet Take 1 tablet (40 mg total) by mouth daily. 05/06/24   Dineen Rollene MATSU, FNP  Multiple Vitamins-Minerals (CENTRUM SILVER ULTRA WOMENS) TABS Take 1 tablet by mouth daily.    [provider]  nitroGLYCERIN  (NITROSTAT ) 0.4 MG SL tablet Place 1 tablet (0.4 mg total) under the tongue every 5 (five) minutes as needed for chest pain. 12/10/23 12/09/24  Rana Lum CROME, NP  ondansetron  (ZOFRAN -ODT) 4 MG disintegrating tablet Take 1 tablet (4 mg total) by mouth every 6 (six) hours as needed for nausea or vomiting. 01/06/21   Dineen Rollene MATSU, FNP  rosuvastatin  (CRESTOR ) 40 MG tablet Take 1 tablet (40 mg total) by mouth daily. 05/06/24   Dineen Rollene MATSU, FNP  Tiotropium Bromide  (SPIRIVA  RESPIMAT) 2.5 MCG/ACT AERS INHALE 2 SPRAY(S) BY MOUTH ONCE DAILY 12/15/24   Isadora Hose, MD      VITAL SIGNS:  Blood pressure 129/61, pulse 96, temperature 98.7 F (37.1 C), temperature source Oral, resp. rate 18, height 5' 5 (1.651 m), weight 59 kg, SpO2 90%.  PHYSICAL EXAMINATION:  Physical Exam  GENERAL:  64 y.o.-year-old patient lying in the bed with no acute distress.  EYES: Pupils equal, round, reactive to light and accommodation. No scleral icterus. Extraocular muscles intact.  HEENT: Head atraumatic, normocephalic. Oropharynx and nasopharynx clear.  NECK:  Supple, no jugular venous distention. No thyroid  enlargement, no tenderness.  LUNGS: Normal breath sounds bilaterally, no wheezing, rales,rhonchi or crepitation. No use of accessory muscles of respiration.  CARDIOVASCULAR: Regular rate and rhythm, S1, S2 normal. No murmurs, rubs, or gallops.  ABDOMEN: Soft, nondistended, nontender. Bowel sounds present. No organomegaly or mass.  EXTREMITIES: No pedal edema, cyanosis, or clubbing.  NEUROLOGIC: Cranial nerves II through XII are intact. Muscle strength 5/5 in all extremities. Sensation intact. Gait not checked. Musculoskeletal: Right  lower extremity is in a long-leg splint.  Right wrist is splinted as well. PSYCHIATRIC: The patient is alert and oriented x 3.  Normal affect and good eye contact. SKIN: No obvious rash, lesion, or ulcer.   LABORATORY PANEL:   CBC Recent Labs  Lab 01/02/25 0231  WBC 10.1  HGB 10.3*  HCT 33.1*  PLT 194   ------------------------------------------------------------------------------------------------------------------  Chemistries  Recent Labs  Lab 01/02/25 0231  NA 142  K 4.0  CL 95*  CO2 41*  GLUCOSE 128*  BUN 18  CREATININE 0.87  CALCIUM  9.8   ------------------------------------------------------------------------------------------------------------------  Cardiac Enzymes No results for input(s): TROPONINI in the last 168 hours. ------------------------------------------------------------------------------------------------------------------  RADIOLOGY:  DG Ankle 2 Views Right Result Date: 01/02/2025 EXAM: 2 VIEW(S) XRAY OF THE RIGHT ANKLE 01/02/2025 02:18:00 AM CLINICAL HISTORY: Injury COMPARISON: None available. FINDINGS: BONES AND JOINTS: Laterally displaced, angulated distal fibular fracture. Mildly displaced medial malleolar fracture. Suspected posterior malleolar fracture, although poorly visualized. Tibiotalar anterolateral dislocation/subluxation. SOFT TISSUES: Associated soft tissue swelling. IMPRESSION: 1. Suspected trimalleolar fracture, although posterior component poorly visualized. 2. Associated tibiotalar dislocation/subluxation. Electronically signed by: Pinkie Pebbles MD MD 01/02/2025 02:24 AM EST RP Workstation: [id number]      IMPRESSION AND PLAN:  Assessment and Plan: * Closed displaced trimalleolar fracture of right ankle, initial encounter - The patient will be admitted to the medical-surgical bed. - This was associated with tibiotalar dislocation.  She had a reduction under sedation in the ED for her dislocation - Pain management will be  provided. - She will be kept NPO. - Podiatry consult to be obtained. - Dr. Lennie was notified about the patient. - She has no history of CHF, CVA, diabetes mellitus on insulin, coronary artery disease or renal failure with creatinine more than 2.  She is considered at average risk for her age for perioperative cardiovascular events per the revised cardiac risk index.  Dyslipidemia - Will continue statin therapy.  That should provide perioperative cardiovascular risk reduction.  Chronic obstructive pulmonary disease (COPD) (HCC) - Will continue her inhalers.  Essential hypertension - Will continue antihypertensive therapy.  Anxiety and depression - We will continue her Xanax  and Lexapro .   DVT prophylaxis: SCDs.  Medical prophylaxis is postponed till postoperative. Advanced Care Planning:  Code Status: The patient is DNR only. Family Communication:  The plan of care was discussed in details with the patient (and family). I answered all questions. The patient agreed to proceed with the above mentioned plan. Further management will depend upon hospital course. Disposition Plan: Back to previous home environment Consults called: Podiatry. All the records are reviewed and case discussed with ED provider.  Status is: Inpatient  At the time of the admission, it appears that the appropriate admission status for this patient is inpatient.  This is judged to be reasonable and necessary in order to provide the required intensity of service to ensure the patient's safety given the presenting symptoms, physical exam findings and initial radiographic and laboratory data in the context of comorbid conditions.  The patient requires inpatient status due to high intensity of service, high risk of further deterioration and high frequency of surveillance required.  I certify that at the time of admission, it is my clinical judgment that the patient will require inpatient hospital care extending more than  2 midnights.                            Dispo: The patient is from: Home              Anticipated d/c is to: Home              Patient currently is not medically stable to d/c.              Difficult to place patient: No  Madison DELENA Peaches M.D on 01/02/2025 at  7:43 AM  [facility]   From 7 PM-7 AM, contact night-coverage [email or web address]  CC: Primary care physician; Dineen Rollene MATSU, FNP     [1]  Allergies Allergen Reactions   Valium [Diazepam] Anxiety   "

## 2025-01-02 NOTE — Progress Notes (Signed)
 Upon arrival to preop from the patients room, patient was noted to have O2 sats of 72 on 4 liters nasal cannula. Patient noted to have blue tinged lips and grey in color. Patient had labored breathing with audible inspiratory wheezing. Patient placed on 6 liters oxygen  via nasal cannula with minimal improvement. Patient then placed on simple mask on 8 liters and O2 sats improved to 81. Dr. Leavy notified. Orders received. And patient given a Duoneb. After the Duoneb the patient was placed back on 8 liters oxygen  via nasal cannula with O2 sats improving to 88%.

## 2025-01-02 NOTE — Sedation Documentation (Signed)
 Procedure started.

## 2025-01-02 NOTE — ED Provider Notes (Signed)
 "  [facility] Provider Note    Event Date/Time   First MD Initiated Contact with Patient 01/02/25 0159     (approximate)   History   Ankle Pain   HPI Debra Hogan is a 64 y.o. female with a history of MS and chronic respiratory failure on oxygen  at baseline.  She presents tonight after mechanical fall at home.  Her mobility is limited due to her MS and she says she just got tripped up and rolled her right ankle.  There is an obvious deformity upon arrival.  She says she can wiggle her toes but the pain is severe if she tries to move at all.  She did not strike her head or lose consciousness.  She has no headache or neck pain and has had no chest pain or shortness of breath.  She says she does not feel like her MS symptoms are worse than usual, it was just a chronic issue that caused her to fall tonight.  Her husband is with her at bedside and confirmed the history as provided.     Physical Exam   Triage Vital Signs: ED Triage Vitals  Encounter Vitals Group     BP 01/02/25 0152 126/61     Girls Systolic BP Percentile --      Girls Diastolic BP Percentile --      Boys Systolic BP Percentile --      Boys Diastolic BP Percentile --      Pulse Rate 01/02/25 0152 (!) 103     Resp 01/02/25 0152 20     Temp 01/02/25 0152 98.5 F (36.9 C)     Temp src --      SpO2 01/02/25 0152 94 %     Weight 01/02/25 0151 59 kg (130 lb)     Height 01/02/25 0151 1.651 m (5' 5)     Head Circumference --      Peak Flow --      Pain Score 01/02/25 0151 9     Pain Loc --      Pain Education --      Exclude from Growth Chart --     Most recent vital signs: Vitals:   01/02/25 0350 01/02/25 0355  BP: (!) 125/47 106/63  Pulse: 93 91  Resp: (!) 22 18  Temp:    SpO2:      General: Awake, alert, appears chronically ill and in pain but nontoxic. CV:  Good peripheral perfusion.  Regular rate and rhythm.  Normal peripheral perfusion and palpable pulse in right  foot. Resp:  Normal effort. Speaking easily and comfortably, no accessory muscle usage nor intercostal retractions.  Lungs clear to auscultation. Abd:  No distention.  Other:  Gross deformity with medial skin tenting and ecchymosis but no skin disruption of right ankle suggestive of trimalleolar fracture and/or dislocation.     ED Results / Procedures / Treatments   Labs (all labs ordered are listed, but only abnormal results are displayed) Labs Reviewed  BASIC METABOLIC PANEL WITH GFR - Abnormal; Notable for the following components:      Result Value   Chloride 95 (*)    CO2 41 (*)    Glucose, Bld 128 (*)    All other components within normal limits  CBC WITH DIFFERENTIAL/PLATELET - Abnormal; Notable for the following components:   RBC 3.23 (*)    Hemoglobin 10.3 (*)    HCT 33.1 (*)    MCV 102.5 (*)  Neutro Abs 8.6 (*)    All other components within normal limits      RADIOLOGY I independently viewed and interpreted the patient's ankle x-rays and she has substantial dislocation versus subluxation with multiple fractures worrisome for trimalleolar fracture.  Confirmed by radiology.   PROCEDURES:  Critical Care performed: No  .Sedation  Date/Time: 01/02/2025 3:32 AM  Performed by: Gordan Huxley, MD Authorized by: Gordan Huxley, MD   Consent:    Consent obtained:  Written and verbal   Consent given by:  Patient and spouse   Risks discussed:  Allergic reaction, inadequate sedation, nausea, vomiting, respiratory compromise necessitating ventilatory assistance and intubation, prolonged sedation necessitating reversal and prolonged hypoxia resulting in organ damage   Alternatives discussed:  Analgesia without sedation Universal protocol:    Immediately prior to procedure, a time out was called: yes     Patient identity confirmed:  Arm band and verbally with patient Indications:    Procedure performed:  Fracture reduction   Procedure necessitating sedation performed  by:  Physician performing sedation Pre-sedation assessment:    Time since last food or drink:  15 hours   ASA classification: class 3 - patient with severe systemic disease     Mallampati score:  II - soft palate, uvula, fauces visible   Neck mobility: normal     Pre-sedation assessments completed and reviewed: airway patency, cardiovascular function, hydration status, mental status, nausea/vomiting, pain level, respiratory function and temperature   A pre-sedation assessment was completed prior to the start of the procedure Immediate pre-procedure details:    Reassessment: Patient reassessed immediately prior to procedure     Reviewed: vital signs, relevant labs/tests and NPO status     Verified: bag valve mask available, emergency equipment available, intubation equipment available, IV patency confirmed, oxygen  available, reversal medications available and suction available   Procedure details (see MAR for exact dosages):    Preoxygenation:  Nasal cannula   Sedation:  Etomidate    Intended level of sedation: deep   Analgesia:  Morphine    Intra-procedure monitoring:  Blood pressure monitoring, cardiac monitor, continuous pulse oximetry, continuous capnometry, frequent LOC assessments and frequent vital sign checks   Intra-procedure events: none     Total Provider sedation time (minutes):  10 Post-procedure details:   A post-sedation assessment was completed following the completion of the procedure.   Attendance: Constant attendance by certified staff until patient recovered     Recovery: Patient returned to pre-procedure baseline     Post-sedation assessments completed and reviewed: airway patency, cardiovascular function, hydration status, mental status, nausea/vomiting, pain level, respiratory function and temperature     Patient is stable for discharge or admission: yes     Procedure completion:  Tolerated well, no immediate complications .Ortho Injury Treatment  Date/Time: 01/02/2025  3:35 AM  Performed by: Gordan Huxley, MD Authorized by: Gordan Huxley, MD   Consent:    Consent obtained:  Written   Consent given by:  Patient and spouse   Risks discussed:  Fracture, irreducible dislocation, recurrent dislocation, nerve damage, restricted joint movement, stiffness and vascular damage   Alternatives discussed:  No treatmentInjury location: ankle Location details: right ankle Injury type: fracture-dislocation Fracture type: trimalleolar Pre-procedure neurovascular assessment: neurovascularly intact Pre-procedure distal perfusion: normal Pre-procedure neurological function: normal Pre-procedure range of motion: reduced  Anesthesia: Local anesthesia used: no  Patient sedated: Yes. Refer to sedation procedure documentation for details of sedation. Manipulation performed: yes Skeletal traction used: yes Reduction successful: yes X-ray confirmed reduction: CT scan pending  for operative planning. Immobilization: splint Splint type: short leg and ankle stirrup Splint Applied by: ED Provider and ED Tech Supplies used: Ortho-Glass Post-procedure neurovascular assessment: post-procedure neurovascularly intact Post-procedure distal perfusion: normal Post-procedure neurological function: normal Post-procedure range of motion: unchanged       IMPRESSION / MDM / ASSESSMENT AND PLAN / ED COURSE  I reviewed the triage vital signs and the nursing notes.                              Differential diagnosis includes, but is not limited to, fracture, dislocation  Patient's presentation is most consistent with acute presentation with potential threat to life or bodily function.  Labs/studies ordered (see ED course for additional labs and studies that Loeb have been added later): BMP, CBC with differential, two-view ankle x-ray, CT ankle  Interventions/Medications given:  Medications  0.9 %  sodium chloride  infusion (has no administration in time range)  morphine  (PF) 4  MG/ML injection 4 mg (4 mg Intravenous Given 01/02/25 0208)  ondansetron  (ZOFRAN ) injection 4 mg (4 mg Intravenous Given 01/02/25 0207)  etomidate  (AMIDATE ) injection 6 mg (6 mg Intravenous Given 01/02/25 0339)    (Note:  hospital course my include additional interventions and/or labs/studies not listed above.)   Patient is not an ideal candidate for sedation given the current respiratory issues and the MS, but she is going to need sedation and reduction of her ankle fracture/dislocation.  This unfortunately seems to be a mechanical fall as a result of her chronic neurological illness, not in acute exacerbation of her MS.  Lab work is essentially normal.  I consulted by phone with Dr. Lennie who agreed with the plan for hospitalist admission, reduction and splinting of the ankle, and requested a CT scan without contrast after the reduction for his operative planning purposes.  He will see the patient later this morning and plan for surgery later today.  I obtained consent from the patient and her spouse after she also received IV morphine  4 mg IV due to severe pain.  They understand the risks and benefits of the sedation and reduction and would like to proceed.  I will then consult the hospitalist team for admission.     Clinical Course as of 01/02/25 0401  [place] Jan 02, 2025  [id number] Successful reduction, much better external appearance.  Foregoing postreduction films in favor of the CT requested by Dr. Lennie.I consulted by phone with the admitting hospitalist, and they will admit the patient - Dr. Lawence [CF]    Clinical Course User Index [CF] Gordan Huxley, MD     FINAL CLINICAL IMPRESSION(S) / ED DIAGNOSES   Final diagnoses:  Closed displaced trimalleolar fracture of right ankle, initial encounter  Multiple sclerosis  Chronic respiratory failure with hypoxia (HCC)     Rx / DC Orders   ED Discharge Orders     None        Note:  This document was prepared using Dragon voice  recognition software and Garfinkle include unintentional dictation errors.   Gordan Huxley, MD 01/02/25 0401  "

## 2025-01-02 NOTE — Assessment & Plan Note (Signed)
-   We will continue her Xanax and Lexapro.

## 2025-01-02 NOTE — Transfer of Care (Signed)
 Immediate Anesthesia Transfer of Care Note  Patient: Debra Hogan  Procedure(s) Performed: CLOSED REDUCTION, ANKLE (Right: Ankle)  Patient Location: PACU  Anesthesia Type:MAC combined with regional for post-op pain  Level of Consciousness: drowsy and patient cooperative  Airway & Oxygen  Therapy: Patient Spontanous Breathing and Patient connected to face mask oxygen   Post-op Assessment: Report given to RN and Post -op Vital signs reviewed and stable  Post vital signs: Reviewed and stable  Last Vitals:  Vitals Value Taken Time  BP 119/71 01/02/25 11:15  Temp 37.4 C 01/02/25 11:00  Pulse 107 01/02/25 11:19  Resp 22 01/02/25 11:19  SpO2 91 % 01/02/25 11:19  Vitals shown include unfiled device data.  Last Pain:  Vitals:   01/02/25 1100  TempSrc:   PainSc: 0-No pain         Complications: No notable events documented.

## 2025-01-02 NOTE — ED Triage Notes (Signed)
 Pt reports she fell tonight and landed on right ankle, pt has obvious deformity to ankle. Pt has hx MS

## 2025-01-02 NOTE — Progress Notes (Signed)
 Contacted Dr. Dezii (attending MD) regarding patient need of transferring to stepdown due to increased 02 need. Request made by Dr. Leavy with anesthesia. See note from pre-op regarding patient status pre-procedure.

## 2025-01-02 NOTE — Progress Notes (Addendum)
" ° °  Interim no charge progress note   This patient, Debra Hogan is a 64 y.o. female who was admitted by colleague earlier this morning with trimalleolar fracture.  While in the perioperative area went into respiratory distress On chart review it does appear that she has extensive respiratory medical history including chronic respiratory failure on 3 L at baseline, emphysema, MS, chronic opioid dependence. While in preop she was given 125 Solu-Medrol  and had some improvement though does require higher levels of oxygen  than her baseline. Podiatry was able to attempt a closed reduction but ultimately had to postpone permanent fixation due to her decompensated respiratory status.  When I evaluated the patient on the medical floor she is disoriented, demonstrates increased work of breathing, and has poor aeration bilaterally.  Stat ABG was performed which does reveal hypercapnia though at this time she is compensated. Her husband is at bedside.  I discussed trial of BiPAP with her and they are both amendable.  I will proceed with treatment for COPD exacerbation.  Status post 125 Solu-Medrol  today, will continue with 80mg  Methylpred twice daily and taper as she tolerates. -CXR with unchanged emphysema, hyperinflation, central bronchial thickening.  No leukocytosis or fever to suggest pneumonia.  No evidence of pulmonary edema. - Scheduled DuoNebs. - Resume home meds.   *Addendum, patient much improved when keeping BiPAP in place.  Does require constant redirection.  Bedside RN is present at bedside and redirecting.  If she continues to remove BiPAP and desaturate will need to transfer to stepdown unit and consider intubation to maintain safe airway.     01/02/2025    3:32 PM 01/02/2025    2:45 PM 01/02/2025    2:30 PM  Vitals with BMI  Systolic 98 103 92  Diastolic 49 44 57  Pulse 117 107 110       Latest Ref Rng & Units 01/02/2025    4:00 PM 01/02/2025    2:31 AM 01/03/2024    4:27 PM  CBC  WBC  4.0 - 10.5 K/uL 9.4  10.1  7.0   Hemoglobin 12.0 - 15.0 g/dL 9.8  89.6  85.9   Hematocrit 36.0 - 46.0 % 31.5  33.1  41.3   Platelets 150 - 400 K/uL 174  194  237.0        Latest Ref Rng & Units 01/02/2025    2:31 AM 05/15/2024    1:51 PM 01/03/2024    4:27 PM  BMP  Glucose 70 - 99 mg/dL 871  90  90   BUN 8 - 23 mg/dL 18  13  21    Creatinine 0.44 - 1.00 mg/dL 9.12  9.19  9.18   Sodium 135 - 145 mmol/L 142  143  142   Potassium 3.5 - 5.1 mmol/L 4.0  4.0  3.6   Chloride 98 - 111 mmol/L 95  98  94   CO2 22 - 32 mmol/L 41  37  39   Calcium  8.9 - 10.3 mg/dL 9.8  9.7  9.9      "

## 2025-01-03 ENCOUNTER — Inpatient Hospital Stay

## 2025-01-03 DIAGNOSIS — S82851A Displaced trimalleolar fracture of right lower leg, initial encounter for closed fracture: Secondary | ICD-10-CM | POA: Diagnosis not present

## 2025-01-03 LAB — BASIC METABOLIC PANEL WITH GFR
Anion gap: 7 (ref 5–15)
BUN: 21 mg/dL (ref 8–23)
CO2: 38 mmol/L — ABNORMAL HIGH (ref 22–32)
Calcium: 9.5 mg/dL (ref 8.9–10.3)
Chloride: 97 mmol/L — ABNORMAL LOW (ref 98–111)
Creatinine, Ser: 0.55 mg/dL (ref 0.44–1.00)
GFR, Estimated: >60 mL/min
Glucose, Bld: 133 mg/dL — ABNORMAL HIGH (ref 70–99)
Potassium: 4.2 mmol/L (ref 3.5–5.1)
Sodium: 142 mmol/L (ref 135–145)

## 2025-01-03 LAB — CBC
HCT: 30.8 % — ABNORMAL LOW (ref 36.0–46.0)
Hemoglobin: 9.8 g/dL — ABNORMAL LOW (ref 12.0–15.0)
MCH: 31.8 pg (ref 26.0–34.0)
MCHC: 31.8 g/dL (ref 30.0–36.0)
MCV: 100 fL (ref 80.0–100.0)
Platelets: 193 K/uL (ref 150–400)
RBC: 3.08 MIL/uL — ABNORMAL LOW (ref 3.87–5.11)
RDW: 11.9 % (ref 11.5–15.5)
WBC: 7.7 K/uL (ref 4.0–10.5)
nRBC: 0 % (ref 0.0–0.2)

## 2025-01-03 MED ORDER — BUDESONIDE 0.5 MG/2ML IN SUSP
0.5000 mg | Freq: Two times a day (BID) | RESPIRATORY_TRACT | Status: DC
  Administered 2025-01-03 – 2025-01-05 (×5): 0.5 mg via RESPIRATORY_TRACT
  Filled 2025-01-03 (×7): qty 2

## 2025-01-03 MED ORDER — IOHEXOL 350 MG/ML SOLN
75.0000 mL | Freq: Once | INTRAVENOUS | Status: AC | PRN
  Administered 2025-01-03: 75 mL via INTRAVENOUS

## 2025-01-03 MED ORDER — IPRATROPIUM-ALBUTEROL 0.5-2.5 (3) MG/3ML IN SOLN
3.0000 mL | RESPIRATORY_TRACT | Status: DC
  Administered 2025-01-03 – 2025-01-04 (×6): 3 mL via RESPIRATORY_TRACT
  Filled 2025-01-03 (×6): qty 3

## 2025-01-03 MED ORDER — HEPARIN SODIUM (PORCINE) 5000 UNIT/ML IJ SOLN
5000.0000 [IU] | Freq: Three times a day (TID) | INTRAMUSCULAR | Status: DC
  Administered 2025-01-03 – 2025-01-07 (×13): 5000 [IU] via SUBCUTANEOUS
  Filled 2025-01-03 (×13): qty 1

## 2025-01-03 NOTE — Plan of Care (Signed)

## 2025-01-03 NOTE — Progress Notes (Signed)
 PODIATRY / FOOT AND ANKLE SURGERY PROGRESS NOTE  Requesting Physician: Dr. Gordan  Reason for consult: Right displaced ankle fracture  Chief Complaint: Right ankle pain   HPI: Debra Hogan is a 64 y.o. female who presents today resting in bed fairly comfortably not complaining of much pain to the right ankle today.  She is Dressings clean, dry, and intact.  She is on oxygen  currently and undergoing breathing treatments due to COPD exacerbation.  Oxygen  saturation still remains an issue.  PMHx:  Past Medical History:  Diagnosis Date   Anginal pain    Anxiety    Anxiety and depression    Arthritis    Asthma    Atherosclerosis of aorta    Cerebral aneurysm    Chronic bronchitis (HCC)    Chronic heart failure with preserved ejection fraction (HFpEF) (HCC)    a. 10/2023 Echo: EF 65-70%, GrI DD; b. 05/2024 Echo: EF 60-65%, no rwma, nl RV size/fxn, small circumferential pericardial effusion.  No significant valvular disease.   Chronic hypoxic respiratory failure, on home oxygen  therapy (HCC)    Chronic low back pain with sciatica    Chronic pain syndrome    Chronic, continuous use of opioids    COPD, very severe (HCC)    Coronary artery disease - non-obstructive    a. 09/2019 Cath: LM nl, LAD nl, LCX nl, RCA 40p (vasospasm noted ostial RCA). EF 65%-->ccb started.   Coronary vasospasm    a. 09/2019 Cath: ost RCA vasospasm-->ccb started.   DDD (degenerative disc disease), cervical    DDD (degenerative disc disease), lumbar    Depression    Dyspnea    emphasema   Fatigue    Fibromyalgia    Generalized anxiety disorder    Headache    Heat stroke 2006   Hypertension    Hypertension    Irritable bowel syndrome    MS (multiple sclerosis)    Followed by Dr. Maree   Myalgia    Myocardial infarction [facility])    Myositis    Neuropathic pain    On supplemental oxygen  therapy    on 3L [place]   Osteoarthritis of left shoulder 01/12/2022   PAH (pulmonary artery hypertension) (HCC)    a.  09/2019 RHC: RA 12, RV 42/14, PA 38/18 (27), PCWP 14. CO 4.6 L/min.   Pericardial effusion    a. 10/2023 Echo: small circumferential pericardial effusion without tamponade; b.  05/2024 Echo: Small circumferential pericardial effusion 1 cm of the RV and LV free wall.   Right-sided thoracic back pain    Sleep apnea (untreated)    Subclinical hypothyroidism    Tobacco abuse     Surgical Hx:  Past Surgical History:  Procedure Laterality Date   ABDOMINAL HYSTERECTOMY     CERVIX intact; had for noncancerous reasons, endometriosis   CATARACT EXTRACTION W/PHACO Left 08/27/2024   Procedure: PHACOEMULSIFICATION, CATARACT, WITH IOL INSERTION 2.85, 00:19.0;  Surgeon: Mittie Gaskin, MD;  Location: [facility];  Service: Ophthalmology;  Laterality: Left;   CATARACT EXTRACTION W/PHACO Right 12/10/2024   Procedure: PHACOEMULSIFICATION, CATARACT, WITH IOL INSERTION 6.93, 01:01.4;  Surgeon: Mittie Gaskin, MD;  Location: [facility];  Service: Ophthalmology;  Laterality: Right;   CHOLECYSTECTOMY N/A 01/03/2016   Procedure: LAPAROSCOPIC CHOLECYSTECTOMY WITH INTRAOPERATIVE CHOLANGIOGRAM;  Surgeon: Charlie FORBES Fell, MD;  Location: [facility];  Service: General;  Laterality: N/A;   ENDOSCOPIC RETROGRADE CHOLANGIOPANCREATOGRAPHY (ERCP) WITH PROPOFOL  N/A 01/04/2016   Procedure: ENDOSCOPIC RETROGRADE CHOLANGIOPANCREATOGRAPHY (ERCP) WITH PROPOFOL ;  Surgeon: Rogelia Copping,  MD;  Location: [facility];  Service: Endoscopy;  Laterality: N/A;   FOOT SURGERY  2001   LAPAROSCOPIC ENDOMETRIOSIS FULGURATION     RIGHT/LEFT HEART CATH AND CORONARY ANGIOGRAPHY Bilateral 09/29/2019   Procedure: RIGHT/LEFT HEART CATH AND CORONARY ANGIOGRAPHY;  Surgeon: Mady Bruckner, MD;  Location: [facility] IN[facility];  Service: Cardiovascular;  Laterality: Bilateral;    FHx:  Family History  Problem Relation Age of Onset   Hypertension Mother    Hyperlipidemia Mother    Arthritis Mother    Cancer Father         throat   Heart disease Brother    Bipolar disorder Son    Schizophrenia Son    Cancer Maternal Grandfather        liver   Cancer Paternal Grandmother        liver    Social History:  reports that she has been smoking cigarettes. She started smoking about 53 years ago. She has a 53 pack-year smoking history. She has never used smokeless tobacco. She reports current drug use. Drugs: Marijuana and Cocaine. She reports that she does not drink alcohol.  Allergies: Allergies[1]  Medications Prior to Admission  Medication Sig Dispense Refill   acyclovir  (ZOVIRAX ) 400 MG tablet Take 1 tablet (400 mg total) by mouth daily. 90 tablet 3   Albuterol  Sulfate (PROAIR  RESPICLICK) 108 (90 Base) MCG/ACT AEPB INHALE 1 PUFF BY MOUTH EVERY 6 HOURS AS NEEDED FOR COUGH 1 each 2   ALPRAZolam  (XANAX ) 0.25 MG tablet Take 1 tablet by mouth twice daily as needed for anxiety 60 tablet 2   aspirin  EC 81 MG tablet Take 81 mg by mouth daily.     cholecalciferol  (VITAMIN D ) 1000 units tablet Take 1,000 Units by mouth daily.     cyclobenzaprine  (FLEXERIL ) 10 MG tablet Take 10 mg by mouth at bedtime.     cycloSPORINE (RESTASIS) 0.05 % ophthalmic emulsion 1 drop 2 (two) times daily.     diltiazem  (CARDIZEM  CD) 180 MG 24 hr capsule TAKE 1 CAPSULE BY MOUTH ONCE DAILY 90 capsule 3   escitalopram  (LEXAPRO ) 20 MG tablet Take 1 tablet (20 mg total) by mouth daily. 90 tablet 3   fluticasone -salmeterol (ADVAIR) 250-50 MCG/ACT AEPB Inhale 1 puff into the lungs in the morning and at bedtime. 60 each 12   gabapentin  (NEURONTIN ) 100 MG capsule Take 1 capsule (100 mg total) by mouth 3 (three) times daily. 270 capsule 3   isosorbide  mononitrate (IMDUR ) 60 MG 24 hr tablet Take 1 tablet (60 mg total) by mouth daily. 90 tablet 3   lisinopril  (ZESTRIL ) 40 MG tablet Take 1 tablet (40 mg total) by mouth daily. 90 tablet 4   Multiple Vitamins-Minerals (CENTRUM SILVER ULTRA WOMENS) TABS Take 1 tablet by mouth daily.     nitroGLYCERIN   (NITROSTAT ) 0.4 MG SL tablet Place 1 tablet (0.4 mg total) under the tongue every 5 (five) minutes as needed for chest pain. 25 tablet 3   ondansetron  (ZOFRAN -ODT) 4 MG disintegrating tablet Take 1 tablet (4 mg total) by mouth every 6 (six) hours as needed for nausea or vomiting. 20 tablet 2   rosuvastatin  (CRESTOR ) 40 MG tablet Take 1 tablet (40 mg total) by mouth daily. 90 tablet 4   Tiotropium Bromide  (SPIRIVA  RESPIMAT) 2.5 MCG/ACT AERS INHALE 2 SPRAY(S) BY MOUTH ONCE DAILY 4 g 11    Physical Exam: General: Alert and oriented.  No apparent distress.  Patient able to dorsiflex and plantarflex digits of right foot, capillary fill  time appears to be intact to digits but no great hair growth present to digits to bilateral lower extremities.  Light touch sensation appears to be intact to the the right foot.  Posterior splint dressings to the right lower extremity appear to be intact.  Results for orders placed or performed during the hospital encounter of 01/02/25 (from the past 48 hours)  Basic metabolic panel     Status: Abnormal   Collection Time: 01/02/25  2:31 AM  Result Value Ref Range   Sodium 142 135 - 145 mmol/L   Potassium 4.0 3.5 - 5.1 mmol/L   Chloride 95 (L) 98 - 111 mmol/L   CO2 41 (H) 22 - 32 mmol/L   Glucose, Bld 128 (H) 70 - 99 mg/dL    Comment: Glucose reference range applies only to samples taken after fasting for at least 8 hours.   BUN 18 8 - 23 mg/dL   Creatinine, Ser 9.12 0.44 - 1.00 mg/dL   Calcium  9.8 8.9 - 10.3 mg/dL   GFR, Estimated >39 >39 mL/min    Comment: (NOTE) Calculated using the CKD-EPI Creatinine Equation (2021)    Anion gap 6 5 - 15    Comment: Performed at [facility], [address] [place], [place] [address]  CBC with Differential     Status: Abnormal   Collection Time: 01/02/25  2:31 AM  Result Value Ref Range   WBC 10.1 4.0 - 10.5 K/uL   RBC 3.23 (L) 3.87 - 5.11 MIL/uL   Hemoglobin 10.3 (L) 12.0 - 15.0 g/dL   HCT 66.8 (L) 63.9  - 46.0 %   MCV 102.5 (H) 80.0 - 100.0 fL   MCH 31.9 26.0 - 34.0 pg   MCHC 31.1 30.0 - 36.0 g/dL   RDW 87.7 88.4 - 84.4 %   Platelets 194 150 - 400 K/uL   nRBC 0.0 0.0 - 0.2 %   Neutrophils Relative % 84 %   Neutro Abs 8.6 (H) 1.7 - 7.7 K/uL   Lymphocytes Relative 8 %   Lymphs Abs 0.8 0.7 - 4.0 K/uL   Monocytes Relative 6 %   Monocytes Absolute 0.6 0.1 - 1.0 K/uL   Eosinophils Relative 1 %   Eosinophils Absolute 0.1 0.0 - 0.5 K/uL   Basophils Relative 0 %   Basophils Absolute 0.0 0.0 - 0.1 K/uL   Immature Granulocytes 1 %   Abs Immature Granulocytes 0.05 0.00 - 0.07 K/uL    Comment: Performed at [facility], [address] [place], [place] [address]  Blood gas, arterial     Status: Abnormal   Collection Time: 01/02/25  3:41 PM  Result Value Ref Range   FIO2 0.44 %   O2 Content 6.0 L/min   Delivery systems NASAL CANNULA    pH, Arterial 7.42 7.35 - 7.45   pCO2 arterial 79 (HH) 32 - 48 mmHg    Comment: CRITICAL RESULT CALLED TO, READ BACK BY AND VERIFIED WITH:  KAYLA BRITTON AT 1611, 01/02/2025, SM    pO2, Arterial 58 (L) 83 - 108 mmHg   Bicarbonate 51.2 (H) 20.0 - 28.0 mmol/L   Acid-Base Excess 23.1 (H) 0.0 - 2.0 mmol/L   O2 Saturation 94.2 %   Patient temperature 37.0    Collection site RIGHT RADIAL    Allens test (pass/fail) PASS PASS    Comment: Performed at [facility], [address] [place], [place] [address]  HIV Antibody (routine testing w rflx)     Status: None  Collection Time: 01/02/25  4:00 PM  Result Value Ref Range   HIV Screen 4th Generation wRfx Non Reactive Non Reactive    Comment: Performed at [facility], [address] [place], [place] [address]  CBC     Status: Abnormal   Collection Time: 01/02/25  4:00 PM  Result Value Ref Range   WBC 9.4 4.0 - 10.5 K/uL   RBC 3.09 (L) 3.87 - 5.11 MIL/uL   Hemoglobin 9.8 (L) 12.0 - 15.0 g/dL   HCT 68.4 (L) 63.9 - 53.9 %   MCV 101.9 (H) 80.0 - 100.0 fL   MCH 31.7 26.0 - 34.0 pg    MCHC 31.1 30.0 - 36.0 g/dL   RDW 87.8 88.4 - 84.4 %   Platelets 174 150 - 400 K/uL   nRBC 0.0 0.0 - 0.2 %    Comment: Performed at [facility], [address], [place], [place] [address]  Basic metabolic panel     Status: Abnormal   Collection Time: 01/02/25  4:00 PM  Result Value Ref Range   Sodium 142 135 - 145 mmol/L   Potassium 4.0 3.5 - 5.1 mmol/L   Chloride 96 (L) 98 - 111 mmol/L   CO2 41 (H) 22 - 32 mmol/L   Glucose, Bld 171 (H) 70 - 99 mg/dL    Comment: Glucose reference range applies only to samples taken after fasting for at least 8 hours.   BUN 18 8 - 23 mg/dL   Creatinine, Ser 9.35 0.44 - 1.00 mg/dL   Calcium  9.7 8.9 - 10.3 mg/dL   GFR, Estimated >39 >39 mL/min    Comment: (NOTE) Calculated using the CKD-EPI Creatinine Equation (2021)    Anion gap 6 5 - 15    Comment: Performed at [facility], [address] [place], [place] [address]  Basic metabolic panel     Status: Abnormal   Collection Time: 01/03/25  5:22 AM  Result Value Ref Range   Sodium 142 135 - 145 mmol/L   Potassium 4.2 3.5 - 5.1 mmol/L   Chloride 97 (L) 98 - 111 mmol/L   CO2 38 (H) 22 - 32 mmol/L   Glucose, Bld 133 (H) 70 - 99 mg/dL    Comment: Glucose reference range applies only to samples taken after fasting for at least 8 hours.   BUN 21 8 - 23 mg/dL   Creatinine, Ser 9.44 0.44 - 1.00 mg/dL   Calcium  9.5 8.9 - 10.3 mg/dL   GFR, Estimated >39 >39 mL/min    Comment: (NOTE) Calculated using the CKD-EPI Creatinine Equation (2021)    Anion gap 7 5 - 15    Comment: Performed at [facility], [address] [place], [place] [address]  CBC     Status: Abnormal   Collection Time: 01/03/25  5:22 AM  Result Value Ref Range   WBC 7.7 4.0 - 10.5 K/uL   RBC 3.08 (L) 3.87 - 5.11 MIL/uL   Hemoglobin 9.8 (L) 12.0 - 15.0 g/dL   HCT 69.1 (L) 63.9 - 53.9 %   MCV 100.0 80.0 - 100.0 fL   MCH 31.8 26.0 - 34.0 pg   MCHC 31.8 30.0 - 36.0 g/dL   RDW 88.0 88.4 - 84.4 %    Platelets 193 150 - 400 K/uL   nRBC 0.0 0.0 - 0.2 %    Comment: Performed at [facility], [address], [place], [place] [address]   DG Chest [place] 1 View Result  Date: 01/02/2025 CLINICAL DATA:  Hypoxia. EXAM: PORTABLE CHEST 1 VIEW COMPARISON:  Earlier today FINDINGS: Stable cardiomegaly. Unchanged emphysema, hyperinflation with central bronchial thickening. No focal airspace disease, pleural effusion, pulmonary edema or pneumothorax. Stable osseous structures. IMPRESSION: 1. Stable cardiomegaly. 2. Unchanged emphysema, hyperinflation and central bronchial thickening. Electronically Signed   By: Andrea Gasman M.D.   On: 01/02/2025 16:02   DG Ankle 2 Views Right Result Date: 01/02/2025 CLINICAL DATA:  Elective surgery. EXAM: RIGHT ANKLE - 2 VIEW COMPARISON:  Radiograph earlier today FINDINGS: Two fluoroscopic spot views of the ankle submitted from the operating room. Improved alignment of trimalleolar fracture post splint placement. Improved mortise alignment. Fluoroscopy time 13.9 seconds. Fluoroscopy dose not provided. IMPRESSION: Fluoroscopic spot views during reduction of trimalleolar fracture. Electronically Signed   By: Andrea Gasman M.D.   On: 01/02/2025 16:02   CT Ankle Right Wo Contrast Result Date: 01/02/2025 CLINICAL DATA:  Ankle trauma. EXAM: CT OF THE RIGHT ANKLE WITHOUT CONTRAST TECHNIQUE: Multidetector CT imaging of the right ankle was performed according to the standard protocol. Multiplanar CT image reconstructions were also generated. RADIATION DOSE REDUCTION: This exam was performed according to the departmental dose-optimization program which includes automated exposure control, adjustment of the mA and/or kV according to patient size and/or use of iterative reconstruction technique. COMPARISON:  Earlier same day radiographs. FINDINGS: Bones/Joint/Cartilage Redemonstrated trimalleolar ankle fracture with tibiotalar dislocation/subluxation. Comminuted oblique  transsyndesmotic fracture of the distal fibular metaphysis with up to 6 mm of posterior displacement and 8 mm of lateral displacement of the distal fracture component. Associated lateral angulation. A 1.6 x 1.4 x 0.7 cm fracture fragment is anteromedially displaced between the anterior distal margin of the proximal fibular fracture component and the adjacent distal anterolateral tibia (series 3, images 115-121; series 6, images 77-81; series 7, image 56). Smaller additional surrounding punctate osseous fragments. Medial malleolus fracture demonstrates 7 mm of lateral displacement. Posterior malleolar fracture demonstrates 4 mm of superior displacement proximally and up to 7 mm of posterior displacement/distraction laterally. Fracture margins extend to the tibial plafond with mild cortical surface step-off and up to 4 mm of distraction at the posterior articular surface (series 7, images 66 and 67). Persistent lateral tibiotalar subluxation/dislocation. The medial cortical fracture margin of the distal tibial metaphysis abuts the overlying skin. No additional fracture identified. Postoperative changes of the first metatarsal. Small plantar calcaneal spur. Soft tissue Overlying soft tissue splint in place. Diffuse subcutaneous edema and soft tissue swelling of the ankle, most pronounced laterally and medially extending through the dorsal foot. The posterior tibialis and flexor digitorum longus tendons abut the medial margin of the posterior malleolar fracture without convincing evidence of entrapment. IMPRESSION: 1. Redemonstrated trimalleolar ankle fracture with persistent lateral tibiotalar subluxation/dislocation. The medial cortical fracture margin of the distal tibial metaphysis abuts the overlying skin. 2. Comminuted oblique transsyndesmotic fracture of the distal fibular metaphysis with up to 6 mm of posterior displacement and 8 mm of lateral displacement of the distal fracture component. Associated lateral  angulation. 3. A 1.6 x 1.4 x 0.7 cm fracture fragment is displaced between the anterior distal margin of the proximal fibular fracture component and the adjacent distal anterolateral tibia. Smaller additional surrounding punctate osseous fragments. 4. Medial malleolus fracture demonstrates 7 mm of lateral displacement. 5. Posterior malleolar fracture demonstrates 4 mm of superior displacement proximally and up to 7 mm of posterior displacement/distraction laterally. Fracture margins extend to the tibial plafond with mild cortical surface step-off and up to 4 mm of distraction  at the posterior articular surface. 6. The posterior tibialis and flexor digitorum longus tendons abut the medial margin of the posterior malleolar fracture without convincing evidence of entrapment. 7. Diffuse subcutaneous edema and soft tissue swelling of the ankle, most pronounced laterally and medially extending through the dorsal foot. Electronically Signed   By: Harrietta Sherry M.D.   On: 01/02/2025 11:33   DG C-Arm 1-60 Min-No Report Result Date: 01/02/2025 Fluoroscopy was utilized by the requesting physician.  No radiographic interpretation.   DG Chest Port 1 View Result Date: 01/02/2025 CLINICAL DATA:  Hypoxia EXAM: PORTABLE CHEST 1 VIEW COMPARISON:  July 22, 2018. FINDINGS: Stable cardiomegaly. Emphysematous disease is noted. No acute pulmonary abnormality is noted. Bony thorax is unremarkable. IMPRESSION: No active disease. Electronically Signed   By: Lynwood Landy Raddle M.D.   On: 01/02/2025 08:45   US  OR NERVE BLOCK-IMAGE ONLY [facility]) Result Date: 01/02/2025 There is no interpretation for this exam.  This order is for images obtained during a surgical procedure.  Please See Surgeries Tab for more information regarding the procedure.   DG Ankle 2 Views Right Result Date: 01/02/2025 EXAM: 2 VIEW(S) XRAY OF THE RIGHT ANKLE 01/02/2025 02:18:00 AM CLINICAL HISTORY: Injury COMPARISON: None available. FINDINGS: BONES AND JOINTS:  Laterally displaced, angulated distal fibular fracture. Mildly displaced medial malleolar fracture. Suspected posterior malleolar fracture, although poorly visualized. Tibiotalar anterolateral dislocation/subluxation. SOFT TISSUES: Associated soft tissue swelling. IMPRESSION: 1. Suspected trimalleolar fracture, although posterior component poorly visualized. 2. Associated tibiotalar dislocation/subluxation. Electronically signed by: Pinkie Pebbles MD MD 01/02/2025 02:24 AM EST RP Workstation: [id number]    Blood pressure 129/69, pulse (!) 126, temperature 98 F (36.7 C), resp. rate 16, height 5' 5 (1.651 m), weight 59 kg, SpO2 92%.   Assessment Closed displaced right trimalleolar ankle fracture, comminuted History of smoking Chronic pain syndrome Multiple sclerosis  Plan - Patient seen and examined. - Postreduction imaging reviewed.  Appears to show more anatomic position of the fibula and medial malleolus and less displaced but still slightly displaced overall. - PT/OT has been ordered for patient.  Nonweightbearing to the right lower extremity at all times.  Patient Levit need skilled nursing rehab facility placement.  - Discussed with patient that her ankle fracture is still unstable and slightly displaced.  Would still recommend surgical intervention in the future if she can get more stable for right ankle fracture open reduction with internal fixation.  Awaiting clearance from internal medicine/possibly pulmonology.  Was told that could be possible that it Marquina be sometime next week. - Appreciate medicine recommendations for pain management and DVT prophylaxis.   Podiatry team to monitor more peripherally at this time until clearance is obtained.  Prentice Lee, DPM 01/03/2025, 2:49 PM          [1]  Allergies Allergen Reactions   Valium [Diazepam] Anxiety

## 2025-01-03 NOTE — Progress Notes (Signed)
 " PROGRESS NOTE    Debra Hogan  FMW:[id number] DOB: 08/26/1961 DOA: 01/02/2025 PCP: Dineen Rollene MATSU, FNP  Chief Complaint  Patient presents with   Ankle Pain    Hospital Course:  Debra Hogan is a 64 year old female with extensive past medical history including chronic hypoxic respiratory failure on 3 L at home, COPD, MS, chronic pain with opioid dependence, hypertension, hyperlipidemia.  Patient has frequent falls secondary to her MS.  Prior to arrival she tripped and developed sudden pain in her right ankle.  On arrival to the ED imaging revealed trimalleolar fracture and associated tibiotalar dislocation/subluxation.  She was admitted and podiatry was consulted.  Initially podiatry planned for the OR on 1/9 but ultimately patient developed respiratory distress.  She underwent closed reduction instead with plan to focus on her respiratory status before attempting general anesthesia. She required BiPAP through much of 1/9.  On 1/10 she tolerated transition to heated high flow.  Subjective: Patient on BiPAP overnight.  Has transition to heated high flow this morning with some success.  On evaluation she is still disoriented to year but does demonstrate improvement from yesterday.   Objective: Vitals:   01/03/25 0952 01/03/25 1114 01/03/25 1141 01/03/25 1513  BP:   129/69 123/67  Pulse:   (!) 126 (!) 122  Resp:   16 18  Temp:   98 F (36.7 C)   TempSrc:      SpO2: 94% 92% 92% 93%  Weight:      Height:        Intake/Output Summary (Last 24 hours) at 01/03/2025 1638 Last data filed at 01/03/2025 1438 Gross per 24 hour  Intake 594.98 ml  Output 700 ml  Net -105.02 ml   Filed Weights   01/02/25 0151  Weight: 59 kg    Examination: General exam: Appears calm and comfortable, NAD  Respiratory system: Tachypneic, poor aeration bilaterally, on max dose heated high flow [place] Cardiovascular system: S1 & S2 heard, RRR.  Gastrointestinal system: Abdomen is nondistended, soft and  nontender.  Neuro: Alert and oriented to self, hospital, president.  Disoriented to year  Assessment & Plan:  Principal Problem:   Closed displaced trimalleolar fracture of right ankle, initial encounter Active Problems:   Anxiety and depression   Essential hypertension   Chronic obstructive pulmonary disease (COPD) (HCC)   Dyslipidemia    Acute on chronic hypercapnic respiratory failure COPD with exacerbation - Chronically on 3 L, has required BiPAP.  ABG 1/9 with 7.4 2/79/58/51 - Transition to heated high flow today - CXR without evidence of new infiltrate - Currently treating for COPD exacerbation with steroids and DuoNebs - Have ordered stat CTA to evaluate for PE given persistent hypoxia and tachycardia.  CTA still pending at this time.  Pending replacement IV.  IV team has been consulted - On prophylactic heparin  for now - Have consulted with pulmonology - Continue with heated high flow for now, taper as tolerated - Continue home dose inhalers, steroids, DuoNebs - Follows outpatient pulmonology Dr. Hardy  Multiple sclerosis Fibromyalgia Chronic pain Opioid dependence - Resume home meds.  Takes daily Percocet as well as 3 times daily alprazolam  - MS followed outpatient by Dr. Maree  Pulmonary arterial hypertension - Resume home meds.  Continue outpatient follow-up  Sleep apnea - Continue with CPAP/BiPAP at night  Current tobacco abuse - Has been counseled on cessation  Trimalleolar fracture - Status post reduction with podiatry - Continue nonweightbearing to the right lower extremity - Ankle fracture  remains unstable and displaced.  Needs surgical intervention but unfortunately respiratory status is too tenuous to undergo anesthesia at this time.  Will alert podiatry team when medically optimized  CAD History of coronary vasospasm Congestive heart failure with preserved EF - Continue home meds - Diuresis needed  Dyslipidemia - Continue statin  Essential  hypertension - Continue home meds, titrate as needed  Anxiety and depression - Continue home meds  DVT prophylaxis: Heparin    Code Status: Full Code Disposition: Inpatient pending significant clinical resolution  Consultants:  Treatment Team:  Consulting Physician: Lennie Barter, DPM  Procedures:    Antimicrobials:  Anti-infectives (From admission, onward)    Start     Dose/Rate Route Frequency Ordered Stop   01/02/25 0800  ceFAZolin  (ANCEF ) IVPB 2g/100 mL premix  Status:  Discontinued        2 g 200 mL/hr over 30 Minutes Intravenous On call to O.R. 01/02/25 0749 01/02/25 1502       Data Reviewed: I have personally reviewed following labs and imaging studies CBC: Recent Labs  Lab 01/02/25 0231 01/02/25 1600 01/03/25 0522  WBC 10.1 9.4 7.7  NEUTROABS 8.6*  --   --   HGB 10.3* 9.8* 9.8*  HCT 33.1* 31.5* 30.8*  MCV 102.5* 101.9* 100.0  PLT 194 174 193   Basic Metabolic Panel: Recent Labs  Lab 01/02/25 0231 01/02/25 1600 01/03/25 0522  NA 142 142 142  K 4.0 4.0 4.2  CL 95* 96* 97*  CO2 41* 41* 38*  GLUCOSE 128* 171* 133*  BUN 18 18 21   CREATININE 0.87 0.64 0.55  CALCIUM  9.8 9.7 9.5   GFR: Estimated Creatinine Clearance: 63.1 mL/min (by C-G formula based on SCr of 0.55 mg/dL). Liver Function Tests: No results for input(s): AST, ALT, ALKPHOS, BILITOT, PROT, ALBUMIN in the last 168 hours. CBG: No results for input(s): GLUCAP in the last 168 hours.  No results found for this or any previous visit (from the past 240 hours).   Radiology Studies: DG Chest Port 1 View Result Date: 01/02/2025 CLINICAL DATA:  Hypoxia. EXAM: PORTABLE CHEST 1 VIEW COMPARISON:  Earlier today FINDINGS: Stable cardiomegaly. Unchanged emphysema, hyperinflation with central bronchial thickening. No focal airspace disease, pleural effusion, pulmonary edema or pneumothorax. Stable osseous structures. IMPRESSION: 1. Stable cardiomegaly. 2. Unchanged emphysema, hyperinflation  and central bronchial thickening. Electronically Signed   By: Andrea Gasman M.D.   On: 01/02/2025 16:02   DG Ankle 2 Views Right Result Date: 01/02/2025 CLINICAL DATA:  Elective surgery. EXAM: RIGHT ANKLE - 2 VIEW COMPARISON:  Radiograph earlier today FINDINGS: Two fluoroscopic spot views of the ankle submitted from the operating room. Improved alignment of trimalleolar fracture post splint placement. Improved mortise alignment. Fluoroscopy time 13.9 seconds. Fluoroscopy dose not provided. IMPRESSION: Fluoroscopic spot views during reduction of trimalleolar fracture. Electronically Signed   By: Andrea Gasman M.D.   On: 01/02/2025 16:02   CT Ankle Right Wo Contrast Result Date: 01/02/2025 CLINICAL DATA:  Ankle trauma. EXAM: CT OF THE RIGHT ANKLE WITHOUT CONTRAST TECHNIQUE: Multidetector CT imaging of the right ankle was performed according to the standard protocol. Multiplanar CT image reconstructions were also generated. RADIATION DOSE REDUCTION: This exam was performed according to the departmental dose-optimization program which includes automated exposure control, adjustment of the mA and/or kV according to patient size and/or use of iterative reconstruction technique. COMPARISON:  Earlier same day radiographs. FINDINGS: Bones/Joint/Cartilage Redemonstrated trimalleolar ankle fracture with tibiotalar dislocation/subluxation. Comminuted oblique transsyndesmotic fracture of the distal fibular metaphysis with up  to 6 mm of posterior displacement and 8 mm of lateral displacement of the distal fracture component. Associated lateral angulation. A 1.6 x 1.4 x 0.7 cm fracture fragment is anteromedially displaced between the anterior distal margin of the proximal fibular fracture component and the adjacent distal anterolateral tibia (series 3, images 115-121; series 6, images 77-81; series 7, image 56). Smaller additional surrounding punctate osseous fragments. Medial malleolus fracture demonstrates 7 mm of  lateral displacement. Posterior malleolar fracture demonstrates 4 mm of superior displacement proximally and up to 7 mm of posterior displacement/distraction laterally. Fracture margins extend to the tibial plafond with mild cortical surface step-off and up to 4 mm of distraction at the posterior articular surface (series 7, images 66 and 67). Persistent lateral tibiotalar subluxation/dislocation. The medial cortical fracture margin of the distal tibial metaphysis abuts the overlying skin. No additional fracture identified. Postoperative changes of the first metatarsal. Small plantar calcaneal spur. Soft tissue Overlying soft tissue splint in place. Diffuse subcutaneous edema and soft tissue swelling of the ankle, most pronounced laterally and medially extending through the dorsal foot. The posterior tibialis and flexor digitorum longus tendons abut the medial margin of the posterior malleolar fracture without convincing evidence of entrapment. IMPRESSION: 1. Redemonstrated trimalleolar ankle fracture with persistent lateral tibiotalar subluxation/dislocation. The medial cortical fracture margin of the distal tibial metaphysis abuts the overlying skin. 2. Comminuted oblique transsyndesmotic fracture of the distal fibular metaphysis with up to 6 mm of posterior displacement and 8 mm of lateral displacement of the distal fracture component. Associated lateral angulation. 3. A 1.6 x 1.4 x 0.7 cm fracture fragment is displaced between the anterior distal margin of the proximal fibular fracture component and the adjacent distal anterolateral tibia. Smaller additional surrounding punctate osseous fragments. 4. Medial malleolus fracture demonstrates 7 mm of lateral displacement. 5. Posterior malleolar fracture demonstrates 4 mm of superior displacement proximally and up to 7 mm of posterior displacement/distraction laterally. Fracture margins extend to the tibial plafond with mild cortical surface step-off and up to 4 mm  of distraction at the posterior articular surface. 6. The posterior tibialis and flexor digitorum longus tendons abut the medial margin of the posterior malleolar fracture without convincing evidence of entrapment. 7. Diffuse subcutaneous edema and soft tissue swelling of the ankle, most pronounced laterally and medially extending through the dorsal foot. Electronically Signed   By: Harrietta Sherry M.D.   On: 01/02/2025 11:33   DG C-Arm 1-60 Min-No Report Result Date: 01/02/2025 Fluoroscopy was utilized by the requesting physician.  No radiographic interpretation.   DG Chest Port 1 View Result Date: 01/02/2025 CLINICAL DATA:  Hypoxia EXAM: PORTABLE CHEST 1 VIEW COMPARISON:  July 22, 2018. FINDINGS: Stable cardiomegaly. Emphysematous disease is noted. No acute pulmonary abnormality is noted. Bony thorax is unremarkable. IMPRESSION: No active disease. Electronically Signed   By: Lynwood Landy Raddle M.D.   On: 01/02/2025 08:45   US  OR NERVE BLOCK-IMAGE ONLY [facility]) Result Date: 01/02/2025 There is no interpretation for this exam.  This order is for images obtained during a surgical procedure.  Please See Surgeries Tab for more information regarding the procedure.   DG Ankle 2 Views Right Result Date: 01/02/2025 EXAM: 2 VIEW(S) XRAY OF THE RIGHT ANKLE 01/02/2025 02:18:00 AM CLINICAL HISTORY: Injury COMPARISON: None available. FINDINGS: BONES AND JOINTS: Laterally displaced, angulated distal fibular fracture. Mildly displaced medial malleolar fracture. Suspected posterior malleolar fracture, although poorly visualized. Tibiotalar anterolateral dislocation/subluxation. SOFT TISSUES: Associated soft tissue swelling. IMPRESSION: 1. Suspected trimalleolar fracture, although posterior component  poorly visualized. 2. Associated tibiotalar dislocation/subluxation. Electronically signed by: Pinkie Pebbles MD MD 01/02/2025 02:24 AM EST RP Workstation: [id number]    Scheduled Meds:  budesonide  (PULMICORT ) nebulizer  solution  0.5 mg Nebulization BID   cholecalciferol   1,000 Units Oral Daily   cyclobenzaprine   10 mg Oral QHS   diltiazem   180 mg Oral Daily   escitalopram   20 mg Oral Daily   fluticasone  furoate-vilanterol  1 puff Inhalation Daily   gabapentin   100 mg Oral TID   ipratropium-albuterol   3 mL Nebulization Q4H   isosorbide  mononitrate  60 mg Oral Daily   lisinopril   40 mg Oral Daily   methylPREDNISolone  (SOLU-MEDROL ) injection  80 mg Intravenous Q12H   multivitamin with minerals  1 tablet Oral Daily   rosuvastatin   40 mg Oral Daily   umeclidinium bromide   2 puff Inhalation Daily   Continuous Infusions:   LOS: 1 day  MDM: Patient is high risk for one or more organ failure.  They necessitate ongoing hospitalization for continued IV therapies and subsequent lab monitoring. Total time spent interpreting labs and vitals, reviewing the medical record, coordinating care amongst consultants and care team members, directly assessing and discussing care with the patient and/or family: 55 min  Jesson Foskey, DO [facility]  To contact the attending physician between 7A-7P please use Epic Chat. To contact the covering physician during after hours 7P-7A, please review Amion.  01/03/2025, 4:38 PM   *This document has been created with the assistance of dictation software. Please excuse typographical errors. *   "

## 2025-01-03 NOTE — Progress Notes (Signed)
 PT Cancellation Note  Patient Details Name: Debra Hogan MRN: [id number] DOB: [birth year]   Cancelled Treatment:    Reason Eval/Treat Not Completed: Medical issues which prohibited therapy Orders received, chart reviewed. Patient with HR 125-135 and spO2 90% on HHFNC 55L. Patient is not medically appropriate at this time. PT will re-attempt at later date/time.   Maryanne Finder, PT, DPT Physical Therapist - [facility]  [facility]   [name] [employer] 01/03/2025, 11:44 AM

## 2025-01-03 NOTE — Progress Notes (Signed)
 OT Cancellation Note  Patient Details Name: Debra Hogan MRN: [id number] DOB: 1961/06/12   Cancelled Treatment:    Reason Eval/Treat Not Completed: Patient not medically ready; Hold OT eval today.  Pt on HHF [place] with 02 90% and resting HR ranging 125-135.  Will reassess readiness for therapy evaluation tomorrow.   Inocente Blazing, MS, OTR/L   Inocente MARLA Blazing 01/03/2025, 11:46 AM

## 2025-01-04 ENCOUNTER — Inpatient Hospital Stay: Admit: 2025-01-04 | Discharge: 2025-01-04 | Disposition: A | Attending: Family Medicine

## 2025-01-04 DIAGNOSIS — S82851A Displaced trimalleolar fracture of right lower leg, initial encounter for closed fracture: Secondary | ICD-10-CM | POA: Diagnosis not present

## 2025-01-04 DIAGNOSIS — I3139 Other pericardial effusion (noninflammatory): Secondary | ICD-10-CM

## 2025-01-04 LAB — ECHOCARDIOGRAM COMPLETE
AR max vel: 2.82 cm2
AV Peak grad: 8.6 mmHg
Ao pk vel: 1.47 m/s
Height: 65 in
S' Lateral: 2.4 cm
Weight: 2080 [oz_av]

## 2025-01-04 MED ORDER — FUROSEMIDE 10 MG/ML IJ SOLN
40.0000 mg | Freq: Two times a day (BID) | INTRAMUSCULAR | Status: DC
  Administered 2025-01-04 – 2025-01-05 (×2): 40 mg via INTRAVENOUS
  Filled 2025-01-04 (×2): qty 4

## 2025-01-04 MED ORDER — METOPROLOL TARTRATE 25 MG PO TABS
12.5000 mg | ORAL_TABLET | Freq: Two times a day (BID) | ORAL | Status: DC
  Administered 2025-01-04 – 2025-01-06 (×4): 12.5 mg via ORAL
  Filled 2025-01-04 (×5): qty 1

## 2025-01-04 MED ORDER — IPRATROPIUM-ALBUTEROL 0.5-2.5 (3) MG/3ML IN SOLN
3.0000 mL | Freq: Four times a day (QID) | RESPIRATORY_TRACT | Status: DC
  Administered 2025-01-04 – 2025-01-05 (×5): 3 mL via RESPIRATORY_TRACT
  Filled 2025-01-04 (×4): qty 3

## 2025-01-04 NOTE — Progress Notes (Signed)
 " PROGRESS NOTE    Debra Hogan  FMW:[id number] DOB: 1961-06-23 DOA: 01/02/2025 PCP: Dineen Rollene MATSU, FNP  Chief Complaint  Patient presents with   Ankle Pain    Hospital Course:  Debra Hogan is a 64 year old female with extensive past medical history including chronic hypoxic respiratory failure on 3 L at home, COPD, MS, chronic pain with opioid dependence, hypertension, hyperlipidemia.  Patient has frequent falls secondary to her MS.  Prior to arrival she tripped and developed sudden pain in her right ankle.  On arrival to the ED imaging revealed trimalleolar fracture and associated tibiotalar dislocation/subluxation.  She was admitted and podiatry was consulted.  Initially podiatry planned for the OR on 1/9 but ultimately patient developed respiratory distress.  She underwent closed reduction instead with plan to focus on her respiratory status before attempting general anesthesia. She required BiPAP through much of 1/9.  On 1/10 she tolerated transition to heated high flow.  Subjective: Patient much better this morning.  She is alert and oriented now.  Reports that she is anxious to get off the heated high flow.   Objective: Vitals:   01/04/25 1128 01/04/25 1253 01/04/25 1333 01/04/25 1553  BP: 135/69     Pulse:  (!) 115 (!) 109 (!) 121  Resp:  (!) 21 (!) 34 20  Temp: 98.1 F (36.7 C)     TempSrc: Oral     SpO2:  94% (!) 84% 91%  Weight:      Height:        Intake/Output Summary (Last 24 hours) at 01/04/2025 1643 Last data filed at 01/04/2025 1432 Gross per 24 hour  Intake 120 ml  Output 550 ml  Net -430 ml   Filed Weights   01/02/25 0151  Weight: 59 kg    Examination: General exam: Appears calm and comfortable, NAD  Respiratory system: On heated high flow, no wheeze today.  Better aeration bilaterally Cardiovascular system: S1 & S2 heard, RRR.  Gastrointestinal system: Abdomen is nondistended, soft and nontender.  Neuro: Alert and oriented x3  Assessment & Plan:   Principal Problem:   Closed displaced trimalleolar fracture of right ankle, initial encounter Active Problems:   Anxiety and depression   Essential hypertension   Chronic obstructive pulmonary disease (COPD) (HCC)   Dyslipidemia    Acute on chronic hypercapnic respiratory failure COPD with exacerbation - Chronically on 3 L, has required BiPAP.  ABG 1/9 with 7.4 2/79/58/51 - Has done well on heated high flow, have discussed with our RT to begin transitioning off and back towards nasal cannula - CXR without evidence of new infiltrate - Currently treating for COPD exacerbation with steroids and DuoNebs.  Continue - CTA negative for PE.  Does reveal pericardial effusion - On prophylactic heparin  for now - Have consulted with pulmonology - Continue home dose inhalers, steroids, DuoNebs - Follows outpatient pulmonology Dr. Hardy  Pericardial effusion - Better evaluated on echocardiogram today and demonstrates sizes largely unchanged from prior in June 2025.  No evidence of tamponade - Will start with IV diuresis and monitor for improvement - Consult cardiology  Trimalleolar fracture - Status post reduction with podiatry - Continue nonweightbearing to the right lower extremity - Ankle fracture remains unstable and displaced. - Continues to require medical optimization in respiratory status and cardiac status prior to undergoing anesthesia.  Hopefully she will be more stable by Wednesday and we will aim for surgery at that time.  Will continue to keep the podiatry team updated  Multiple sclerosis Fibromyalgia Chronic pain Opioid dependence - Resume home meds.  Takes daily Percocet as well as 3 times daily alprazolam  - MS followed outpatient by Dr. Maree  Pulmonary arterial hypertension - Resume home meds.  Continue outpatient follow-up  Sleep apnea - Continue with CPAP/BiPAP at night  Current tobacco abuse - Has been counseled on cessation  CAD History of coronary  vasospasm Congestive heart failure with preserved EF - Continue home meds - Getting diuresis as above  Dyslipidemia - Continue statin  Essential hypertension - Continue home meds, titrate as needed  Anxiety and depression - Continue home meds  DVT prophylaxis: Heparin    Code Status: Full Code Disposition: Inpatient pending significant clinical resolution  Consultants:  Treatment Team:  Consulting Physician: Lennie Barter, DPM  Procedures:    Antimicrobials:  Anti-infectives (From admission, onward)    Start     Dose/Rate Route Frequency Ordered Stop   01/02/25 0800  ceFAZolin  (ANCEF ) IVPB 2g/100 mL premix  Status:  Discontinued        2 g 200 mL/hr over 30 Minutes Intravenous On call to O.R. 01/02/25 0749 01/02/25 1502       Data Reviewed: I have personally reviewed following labs and imaging studies CBC: Recent Labs  Lab 01/02/25 0231 01/02/25 1600 01/03/25 0522  WBC 10.1 9.4 7.7  NEUTROABS 8.6*  --   --   HGB 10.3* 9.8* 9.8*  HCT 33.1* 31.5* 30.8*  MCV 102.5* 101.9* 100.0  PLT 194 174 193   Basic Metabolic Panel: Recent Labs  Lab 01/02/25 0231 01/02/25 1600 01/03/25 0522  NA 142 142 142  K 4.0 4.0 4.2  CL 95* 96* 97*  CO2 41* 41* 38*  GLUCOSE 128* 171* 133*  BUN 18 18 21   CREATININE 0.87 0.64 0.55  CALCIUM  9.8 9.7 9.5   GFR: Estimated Creatinine Clearance: 63.1 mL/min (by C-G formula based on SCr of 0.55 mg/dL). Liver Function Tests: No results for input(s): AST, ALT, ALKPHOS, BILITOT, PROT, ALBUMIN in the last 168 hours. CBG: No results for input(s): GLUCAP in the last 168 hours.  No results found for this or any previous visit (from the past 240 hours).   Radiology Studies: ECHOCARDIOGRAM COMPLETE Result Date: 01/04/2025    ECHOCARDIOGRAM REPORT   Patient Name:   Debra Hogan Date of Exam: 01/04/2025 Medical Rec #:  [id number]    Height:       65.0 in Accession #:    [id number]   Weight:       130.0 lb Date of Birth:   [birth year]    BSA:          1.647 m Patient Age:    63 years     BP:           128/65 mmHg Patient Gender: F            HR:           112 bpm. Exam Location:  [facility] Procedure: 2D Echo, 3D Echo, Cardiac Doppler, Color Doppler and Strain Analysis            (Both Spectral and Color Flow Doppler were utilized during            procedure). STAT ECHO Indications:     Pericardial Effusion  History:         Patient has prior history of Echocardiogram examinations, most                  recent 06/12/2024. CHF,  Angina, CAD and Previous Myocardial                  Infarction, Pulmonary HTN, Signs/Symptoms:Dyspnea and Fatigue;                  Risk Factors:Hypertension, Current Smoker and Sleep Apnea.  Sonographer:     Logan Shove RDCS Referring Phys:  [id number] LORANE POLAND Diagnosing Phys: Annabella Scarce MD IMPRESSIONS  1. Left ventricular ejection fraction, by estimation, is 55 to 60%. Left ventricular ejection fraction by 3D volume is 59 %. The left ventricle has normal function. The left ventricle has no regional wall motion abnormalities. There is mild asymmetric left ventricular hypertrophy of the septal segment. Indeterminate diastolic filling due to E-A fusion. The average left ventricular global longitudinal strain is -20.5 %. The global longitudinal strain is normal.  2. Right ventricular systolic function is normal. The right ventricular size is normal. There is mildly elevated pulmonary artery systolic pressure.  3. Pericardial effusion compared side by side with the echo 05/2024 and is unchanged in size. Measures 1.2 cm. Moderate pericardial effusion. There is no evidence of cardiac tamponade.  4. The mitral valve is normal in structure. No evidence of mitral valve regurgitation. No evidence of mitral stenosis.  5. The aortic valve is tricuspid. Aortic valve regurgitation is not visualized. No aortic stenosis is present.  6. The inferior vena cava is normal in size with <50% respiratory variability, suggesting  right atrial pressure of 8 mmHg. FINDINGS  Left Ventricle: Left ventricular ejection fraction, by estimation, is 55 to 60%. Left ventricular ejection fraction by 3D volume is 59 %. The left ventricle has normal function. The left ventricle has no regional wall motion abnormalities. The average left ventricular global longitudinal strain is -20.5 %. Strain was performed and the global longitudinal strain is normal. The left ventricular internal cavity size was normal in size. There is mild asymmetric left ventricular hypertrophy of the septal segment. Indeterminate diastolic filling due to E-A fusion. Right Ventricle: The right ventricular size is normal. No increase in right ventricular wall thickness. Right ventricular systolic function is normal. There is mildly elevated pulmonary artery systolic pressure. The tricuspid regurgitant velocity is 2.74  m/s, and with an assumed right atrial pressure of 8 mmHg, the estimated right ventricular systolic pressure is 38.0 mmHg. Left Atrium: Left atrial size was normal in size. Right Atrium: Right atrial size was normal in size. Pericardium: Pericardial effusion compared side by side with the echo 05/2024 and is unchanged in size. Measures 1.2 cm. A moderately sized pericardial effusion is present. There is diastolic collapse of the right atrial wall. There is no evidence of cardiac tamponade. Mitral Valve: The mitral valve is normal in structure. No evidence of mitral valve regurgitation. No evidence of mitral valve stenosis. Tricuspid Valve: The tricuspid valve is normal in structure. Tricuspid valve regurgitation is trivial. No evidence of tricuspid stenosis. Aortic Valve: The aortic valve is tricuspid. Aortic valve regurgitation is not visualized. No aortic stenosis is present. Aortic valve peak gradient measures 8.6 mmHg. Pulmonic Valve: The pulmonic valve was normal in structure. Pulmonic valve regurgitation is not visualized. No evidence of pulmonic stenosis. Aorta:  The aortic root is normal in size and structure. Venous: The inferior vena cava is normal in size with less than 50% respiratory variability, suggesting right atrial pressure of 8 mmHg. IAS/Shunts: No atrial level shunt detected by color flow Doppler. Additional Comments: 3D was performed not requiring image post processing on an independent  workstation and was normal.  LEFT VENTRICLE PLAX 2D LVIDd:         3.90 cm         Diastology LVIDs:         2.40 cm         LV e' medial:  12.60 cm/s LV PW:         0.80 cm         LV e' lateral: 21.70 cm/s LV IVS:        1.10 cm LVOT diam:     2.00 cm         2D Longitudinal LVOT Area:     3.14 cm        Strain                                2D Strain GLS   -20.4 %                                (A4C):                                2D Strain GLS   -20.9 %                                (A3C):                                2D Strain GLS   -20.1 %                                (A2C):                                2D Strain GLS   -20.5 %                                Avg:                                 3D Volume EF                                LV 3D EF:    Left                                             ventricul                                             ar  ejection                                             fraction                                             by 3D                                             volume is                                             59 %.                                 3D Volume EF:                                3D EF:        59 %                                LV EDV:       78 ml                                LV ESV:       32 ml                                LV SV:        46 ml RIGHT VENTRICLE             IVC RV Basal diam:  3.10 cm     IVC diam: 1.40 cm RV S prime:     17.44 cm/s TAPSE (M-mode): 1.9 cm LEFT ATRIUM             Index        RIGHT ATRIUM           Index LA diam:        3.30  cm 2.00 cm/m   RA Area:     12.30 cm LA Vol (A2C):   42.3 ml 25.68 ml/m  RA Volume:   29.40 ml  17.85 ml/m LA Vol (A4C):   30.9 ml 18.76 ml/m LA Biplane Vol: 37.2 ml 22.58 ml/m  AORTIC VALVE AV Area (Vmax): 2.82 cm AV Vmax:        147.00 cm/s AV Peak Grad:   8.6 mmHg LVOT Vmax:      131.75 cm/s  AORTA Ao Root diam: 2.20 cm Ao Asc diam:  2.70 cm TRICUSPID VALVE TR Peak grad:   30.0 mmHg TR Vmax:        274.00 cm/s  SHUNTS Systemic Diam: 2.00 cm Annabella Scarce MD Electronically signed  by Annabella Scarce MD Signature Date/Time: 01/04/2025/12:28:37 PM    Final    CT Angio Chest Pulmonary Embolism (PE) W or WO Contrast Result Date: 01/03/2025 EXAM: CTA of the Chest with contrast for PE 01/03/2025 08:27:07 PM TECHNIQUE: CTA of the chest was performed after the administration of 75 mL of intravenous contrast (iohexol  (OMNIPAQUE ) 350 MG/ML injection 75 mL IOHEXOL  350 MG/ML SOLN). Multiplanar reformatted images are provided for review. MIP images are provided for review. Automated exposure control, iterative reconstruction, and/or weight based adjustment of the mA/kV was utilized to reduce the radiation dose to as low as reasonably achievable. COMPARISON: 05/05/2024 CLINICAL HISTORY: Pulmonary embolism (PE) high prob; persistent hypoxia. Known emphysema, and MS. DVT evaluation. FINDINGS: PULMONARY ARTERIES: Pulmonary arteries are adequately opacified for evaluation. No pulmonary embolism. Main pulmonary artery is normal in caliber. MEDIASTINUM: Mild coronary artery calcification. Global cardiac size within normal limits. Moderate pericardial effusion, enlarged since prior examination. Mild atherosclerotic calcification within the thoracic aorta. There is no acute abnormality of the thoracic aorta. LYMPH NODES: No mediastinal, hilar or axillary lymphadenopathy. LUNGS AND PLEURA: Moderate emphysema. Bibasilar atelectasis. Small left pleural effusion. No pneumothorax. UPPER ABDOMEN: Limited images of the upper  abdomen are unremarkable. SOFT TISSUES AND BONES: No acute bone or soft tissue abnormality. IMPRESSION: 1. No pulmonary embolism. 2. Moderate pericardial effusion, enlarged since prior examination. 3. Moderate emphysema, and pulmonary emphysema is an independent risk factor for lung cancer; recommend consideration for evaluation for a low-dose CT lung cancer screening program. 4. Mild coronary artery calcification. 5. Raf score includes aortic atherosclerosis (ICD10-I70.0) and emphysema (ICD10-J43.9). Electronically signed by: Dorethia Molt MD MD 01/03/2025 08:45 PM EST RP Workstation: [id number]    Scheduled Meds:  budesonide  (PULMICORT ) nebulizer solution  0.5 mg Nebulization BID   cholecalciferol   1,000 Units Oral Daily   cyclobenzaprine   10 mg Oral QHS   diltiazem   180 mg Oral Daily   escitalopram   20 mg Oral Daily   fluticasone  furoate-vilanterol  1 puff Inhalation Daily   gabapentin   100 mg Oral TID   heparin  injection (subcutaneous)  5,000 Units Subcutaneous Q8H   ipratropium-albuterol   3 mL Nebulization Q6H   isosorbide  mononitrate  60 mg Oral Daily   lisinopril   40 mg Oral Daily   methylPREDNISolone  (SOLU-MEDROL ) injection  80 mg Intravenous Q12H   multivitamin with minerals  1 tablet Oral Daily   rosuvastatin   40 mg Oral Daily   umeclidinium bromide   2 puff Inhalation Daily   Continuous Infusions:   LOS: 2 days  MDM: Patient is high risk for one or more organ failure.  They necessitate ongoing hospitalization for continued IV therapies and subsequent lab monitoring. Total time spent interpreting labs and vitals, reviewing the medical record, coordinating care amongst consultants and care team members, directly assessing and discussing care with the patient and/or family: 55 min  Jackolyn Geron, DO [facility]  To contact the attending physician between 7A-7P please use Epic Chat. To contact the covering physician during after hours 7P-7A, please review Amion.  01/04/2025,  4:43 PM   *This document has been created with the assistance of dictation software. Please excuse typographical errors. *   "

## 2025-01-04 NOTE — Evaluation (Signed)
 Occupational Therapy Evaluation Patient Details Name: Debra Hogan MRN: [id number] DOB: 1961-03-14 Today's Date: 01/04/2025   History of Present Illness   64 y/o female presented to ED on 01/02/25 following fall and landed on R ankle. Sustained closed displaced comminuted R trimalleolar ankle fx. S/p Closed reduction of R trimalleolar fracture on 1/9. Unable to complete definitive fixation 2/2 O2 saturations. PMH: MS, chronic respiratory failure on home O2 at baseline, COPD, anxiety, HTN, fibromyalgia, CAD     Clinical Impressions Patient was seen for OT evaluation this date. Prior to hospital admission, patient was receiving A from spouse for ADLs and ambulating with rollator. Patient currently limited by poor activity tolerance, presents on 45L of HHFNC and has generalized confusion over circumstances/current situation; she is NWB to RLE. She performs bed mobility and sit<>stand with CGA/SBA; demonstrates fair carryover of NWB for short distance mobility, likely would not be able to maintain due to fatigue. Patient presents with deficits in overall activity tolerance, insight into deficits and gross strength, affecting safe and optimal ADL completion. Patient is currently requiring max A for LB ADLs.  Paient would benefit from skilled OT services to address noted impairments and functional limitations (see below for any additional details) in order to maximize safety and independence while minimizing future risk of falls, injury, and readmission. Anticipate the need for follow up OT services upon acute hospital DC.      If plan is discharge home, recommend the following:   A lot of help with walking and/or transfers;A lot of help with bathing/dressing/bathroom;Help with stairs or ramp for entrance;Supervision due to cognitive status     Functional Status Assessment   Patient has had a recent decline in their functional status and demonstrates the ability to make significant improvements in  function in a reasonable and predictable amount of time.     Equipment Recommendations   None recommended by OT     Recommendations for Other Services         Precautions/Restrictions   Precautions Precautions: Fall Recall of Precautions/Restrictions: Impaired Restrictions Weight Bearing Restrictions Per Provider Order: Yes RLE Weight Bearing Per Provider Order: Non weight bearing     Mobility Bed Mobility Overal bed mobility: Needs Assistance Bed Mobility: Supine to Sit, Sit to Supine     Supine to sit: Supervision Sit to supine: Supervision        Transfers Overall transfer level: Needs assistance Equipment used: Rolling walker (2 wheels) Transfers: Sit to/from Stand Sit to Stand: Min assist, +2 safety/equipment           General transfer comment: Able to perform 2 hops towards R at EOB. On 45L O2, spO2 dropping to as low as 86%. Able to maintain NWB on R LE for short mobility task      Balance Overall balance assessment: Needs assistance Sitting-balance support: No upper extremity supported, Feet supported Sitting balance-Leahy Scale: Good     Standing balance support: Bilateral upper extremity supported, Reliant on assistive device for balance Standing balance-Leahy Scale: Poor                             ADL either performed or assessed with clinical judgement   ADL Overall ADL's : Needs assistance/impaired  General ADL Comments: anticipate max A for LB self care tasks due to NWB in RLE and soft cast in place; set up A for UB self care tasks     Vision         Perception         Praxis         Pertinent Vitals/Pain Pain Assessment Pain Assessment: Faces Faces Pain Scale: Hurts even more Pain Location: R ankle Pain Descriptors / Indicators: Discomfort, Grimacing Pain Intervention(s): Monitored during session     Extremity/Trunk Assessment Upper Extremity  Assessment Upper Extremity Assessment: Right hand dominant   Lower Extremity Assessment Lower Extremity Assessment: RLE deficits/detail;Generalized weakness RLE Deficits / Details: R LE immobilized in soft cast. Able to complete SLR       Communication Communication Communication: No apparent difficulties   Cognition Arousal: Alert Behavior During Therapy: WFL for tasks assessed/performed Cognition: Cognition impaired   Orientation impairments: Situation                           Following commands: Impaired Following commands impaired: Follows one step commands with increased time     Cueing  General Comments   Cueing Techniques: Verbal cues;Tactile cues  On 45L HHFNC, spO2 drops to 86% with activity and 3/4 DOE. Increased WOB with activity and at rest (RR 25-35). HR up to 130 with activity   Exercises     Shoulder Instructions      Home Living Family/patient expects to be discharged to:: Private residence Living Arrangements: Spouse/significant other Available Help at Discharge: Family;Available 24 hours/day Type of Home: House Home Access: Ramped entrance     Home Layout: One level     Bathroom Shower/Tub: Chief Strategy Officer: Standard     Home Equipment: Rollator (4 wheels);Shower seat          Prior Functioning/Environment Prior Level of Function : Needs assist             Mobility Comments: ambulatory with rollator ADLs Comments: reports needing assistance for bathing and dressing from husband    OT Problem List: Decreased strength;Decreased activity tolerance;Decreased safety awareness   OT Treatment/Interventions: Self-care/ADL training;Therapeutic exercise;Energy conservation;Therapeutic activities;Patient/family education;Balance training      OT Goals(Current goals can be found in the care plan section)   Acute Rehab OT Goals Patient Stated Goal: to go home OT Goal Formulation: With patient Time For Goal  Achievement: 01/18/25 Potential to Achieve Goals: Good ADL Goals Pt Will Perform Grooming: with modified independence;with set-up;sitting Pt Will Perform Lower Body Dressing: with contact guard assist;with min assist;sitting/lateral leans Pt Will Transfer to Toilet: with contact guard assist;bedside commode Pt Will Perform Toileting - Clothing Manipulation and hygiene: with contact guard assist;sit to/from stand   OT Frequency:  Min 2X/week    Co-evaluation PT/OT/SLP Co-Evaluation/Treatment: Yes Reason for Co-Treatment: For patient/therapist safety;To address functional/ADL transfers PT goals addressed during session: Mobility/safety with mobility;Balance OT goals addressed during session: ADL's and self-care      AM-PAC OT 6 Clicks Daily Activity     Outcome Measure Help from another person eating meals?: None Help from another person taking care of personal grooming?: A Little Help from another person toileting, which includes using toliet, bedpan, or urinal?: A Lot Help from another person bathing (including washing, rinsing, drying)?: A Lot Help from another person to put on and taking off regular upper body clothing?: A Little Help from another person to put on  and taking off regular lower body clothing?: A Lot 6 Click Score: 16   End of Session Equipment Utilized During Treatment: Rolling walker (2 wheels);Oxygen  Nurse Communication: Mobility status  Activity Tolerance: Patient tolerated treatment well Patient left: in bed;with call bell/phone within reach;with bed alarm set;with family/visitor present  OT Visit Diagnosis: Unsteadiness on feet (R26.81);Other abnormalities of gait and mobility (R26.89);Muscle weakness (generalized) (M62.81);History of falling (Z91.81)                Time: 8844-8792 OT Time Calculation (min): 12 min Charges:  OT General Charges $OT Visit: 1 Visit OT Evaluation $OT Eval Moderate Complexity: 1 Mod  Rogers Clause, OT/L MSOT, 01/04/2025

## 2025-01-04 NOTE — Evaluation (Signed)
 Physical Therapy Evaluation Patient Details Name: Debra Hogan MRN: [id number] DOB: 07/16/1961 Today's Date: 01/04/2025  History of Present Illness  64 y/o female presented to ED on 01/02/25 following fall and landed on R ankle. Sustained closed displaced comminuted R trimalleolar ankle fx. S/p Closed reduction of R trimalleolar fracture on 1/9. Unable to complete definitive fixation 2/2 O2 saturations. PMH: MS, chronic respiratory failure on home O2 at baseline, COPD, anxiety, HTN, fibromyalgia, CAD  Clinical Impression  Patient admitted with the above. PTA, patient lives with husband and was ambulatory with rollator and required assistance from husband for ADLs. Currently, patient requires 45L HHFNC and spO2 drops as low as 86% with activity with obvious increased WOB. Education provided to patient and husband on NWB status and impact on mobility, both verbalized understanding. Patient with generalized confusion especially into current situation. Required supervision for bed mobility and minA+2 to stand from EOB with RW while maintaining NWB on RLE. Patient fatigued with minimal activity with HR up to 130. Patient will benefit from skilled PT services during acute stay to address listed deficits. Patient will benefit from ongoing therapy at discharge to maximize functional independence and safety.         If plan is discharge home, recommend the following: A lot of help with walking and/or transfers;A lot of help with bathing/dressing/bathroom;Assistance with cooking/housework;Assist for transportation;Help with stairs or ramp for entrance   Can travel by private vehicle   Yes    Equipment Recommendations Rolling Daelin Haste (2 wheels);BSC/3in1  Recommendations for Other Services       Functional Status Assessment Patient has had a recent decline in their functional status and demonstrates the ability to make significant improvements in function in a reasonable and predictable amount of time.      Precautions / Restrictions Precautions Precautions: Fall Recall of Precautions/Restrictions: Impaired Restrictions Weight Bearing Restrictions Per Provider Order: Yes RLE Weight Bearing Per Provider Order: Non weight bearing      Mobility  Bed Mobility Overal bed mobility: Needs Assistance Bed Mobility: Supine to Sit, Sit to Supine     Supine to sit: Supervision Sit to supine: Supervision        Transfers Overall transfer level: Needs assistance Equipment used: Rolling Ori Kreiter (2 wheels) Transfers: Sit to/from Stand Sit to Stand: Min assist, +2 safety/equipment           General transfer comment: Able to perform 2 hops towards R at EOB. On 45L O2, spO2 dropping to as low as 86%. Able to maintain NWB on R LE for short mobility task    Ambulation/Gait               General Gait Details: deferred due to O2 saturations and amount of O2 at this time  Stairs            Wheelchair Mobility     Tilt Bed    Modified Rankin (Stroke Patients Only)       Balance Overall balance assessment: Needs assistance Sitting-balance support: No upper extremity supported, Feet supported Sitting balance-Leahy Scale: Good     Standing balance support: Bilateral upper extremity supported, Reliant on assistive device for balance Standing balance-Leahy Scale: Poor                               Pertinent Vitals/Pain Pain Assessment Pain Assessment: Faces Faces Pain Scale: Hurts even more Pain Location: R ankle Pain Descriptors / Indicators: Discomfort, Grimacing  Pain Intervention(s): Monitored during session    Home Living Family/patient expects to be discharged to:: Private residence Living Arrangements: Spouse/significant other Available Help at Discharge: Family;Available 24 hours/day Type of Home: House Home Access: Ramped entrance       Home Layout: One level Home Equipment: Rollator (4 wheels);Shower seat      Prior Function Prior  Level of Function : Needs assist             Mobility Comments: ambulatory with rollator ADLs Comments: reports needing assistance for bathing and dressing from husband     Extremity/Trunk Assessment   Upper Extremity Assessment Upper Extremity Assessment: Defer to OT evaluation    Lower Extremity Assessment Lower Extremity Assessment: RLE deficits/detail;Generalized weakness RLE Deficits / Details: R LE immobilized in soft cast. Able to complete SLR       Communication   Communication Communication: No apparent difficulties    Cognition Arousal: Alert Behavior During Therapy: WFL for tasks assessed/performed   PT - Cognitive impairments: Memory, Orientation, Safety/Judgement, Problem solving   Orientation impairments: Person, Place, Time (disoriented to situation)                     Following commands: Impaired Following commands impaired: Follows one step commands with increased time     Cueing       General Comments General comments (skin integrity, edema, etc.): On 45L HHFNC, spO2 drops to 86% with activity and 3/4 DOE. Increased WOB with activity and at rest (RR 25-35). HR up to 130 with activity    Exercises     Assessment/Plan    PT Assessment Patient needs continued PT services  PT Problem List Decreased strength;Decreased activity tolerance;Decreased balance;Decreased mobility;Decreased cognition;Decreased knowledge of use of DME;Decreased safety awareness;Decreased knowledge of precautions;Cardiopulmonary status limiting activity;Pain       PT Treatment Interventions DME instruction;Gait training;Functional mobility training;Therapeutic activities;Therapeutic exercise;Balance training;Neuromuscular re-education;Patient/family education    PT Goals (Current goals can be found in the Care Plan section)  Acute Rehab PT Goals Patient Stated Goal: did not state PT Goal Formulation: With patient/family Time For Goal Achievement:  01/18/25 Potential to Achieve Goals: Fair    Frequency Min 2X/week     Co-evaluation PT/OT/SLP Co-Evaluation/Treatment: Yes Reason for Co-Treatment: For patient/therapist safety;To address functional/ADL transfers PT goals addressed during session: Mobility/safety with mobility;Balance         AM-PAC PT 6 Clicks Mobility  Outcome Measure Help needed turning from your back to your side while in a flat bed without using bedrails?: A Little Help needed moving from lying on your back to sitting on the side of a flat bed without using bedrails?: A Little Help needed moving to and from a bed to a chair (including a wheelchair)?: A Lot Help needed standing up from a chair using your arms (e.g., wheelchair or bedside chair)?: Total Help needed to walk in hospital room?: Total Help needed climbing 3-5 steps with a railing? : Total 6 Click Score: 11    End of Session   Activity Tolerance: Patient limited by fatigue Patient left: in bed;with call bell/phone within reach;with bed alarm set;with family/visitor present Nurse Communication: Mobility status PT Visit Diagnosis: Other abnormalities of gait and mobility (R26.89);Unsteadiness on feet (R26.81);Muscle weakness (generalized) (M62.81);History of falling (Z91.81);Difficulty in walking, not elsewhere classified (R26.2)    Time: 8844-8792 PT Time Calculation (min) (ACUTE ONLY): 12 min   Charges:   PT Evaluation $PT Eval Moderate Complexity: 1 Mod   PT General Charges $$  ACUTE PT VISIT: 1 Visit         Maryanne Finder, PT, DPT Physical Therapist - [facility]  [facility]   Debra Hogan A Jeferson Boozer 01/04/2025, 1:09 PM

## 2025-01-04 NOTE — Progress Notes (Signed)
 Echocardiogram 2D Echocardiogram has been performed.  Izsak Meir N Soraida Vickers,RDCS 01/04/2025, 10:27 AM

## 2025-01-05 ENCOUNTER — Encounter: Payer: Self-pay | Admitting: Podiatry

## 2025-01-05 DIAGNOSIS — I3139 Other pericardial effusion (noninflammatory): Secondary | ICD-10-CM

## 2025-01-05 DIAGNOSIS — I272 Pulmonary hypertension, unspecified: Secondary | ICD-10-CM | POA: Diagnosis not present

## 2025-01-05 DIAGNOSIS — J9620 Acute and chronic respiratory failure, unspecified whether with hypoxia or hypercapnia: Secondary | ICD-10-CM

## 2025-01-05 DIAGNOSIS — E785 Hyperlipidemia, unspecified: Secondary | ICD-10-CM | POA: Diagnosis not present

## 2025-01-05 DIAGNOSIS — I2510 Atherosclerotic heart disease of native coronary artery without angina pectoris: Secondary | ICD-10-CM | POA: Diagnosis not present

## 2025-01-05 DIAGNOSIS — S82851A Displaced trimalleolar fracture of right lower leg, initial encounter for closed fracture: Secondary | ICD-10-CM | POA: Diagnosis not present

## 2025-01-05 LAB — CBC
HCT: 29.8 % — ABNORMAL LOW (ref 36.0–46.0)
Hemoglobin: 9.9 g/dL — ABNORMAL LOW (ref 12.0–15.0)
MCH: 31.7 pg (ref 26.0–34.0)
MCHC: 33.2 g/dL (ref 30.0–36.0)
MCV: 95.5 fL (ref 80.0–100.0)
Platelets: 221 K/uL (ref 150–400)
RBC: 3.12 MIL/uL — ABNORMAL LOW (ref 3.87–5.11)
RDW: 12.6 % (ref 11.5–15.5)
WBC: 9.7 K/uL (ref 4.0–10.5)
nRBC: 0 % (ref 0.0–0.2)

## 2025-01-05 LAB — COMPREHENSIVE METABOLIC PANEL WITH GFR
ALT: 15 U/L (ref 0–44)
AST: 27 U/L (ref 15–41)
Albumin: 3.8 g/dL (ref 3.5–5.0)
Alkaline Phosphatase: 65 U/L (ref 38–126)
Anion gap: 8 (ref 5–15)
BUN: 25 mg/dL — ABNORMAL HIGH (ref 8–23)
CO2: 38 mmol/L — ABNORMAL HIGH (ref 22–32)
Calcium: 9.7 mg/dL (ref 8.9–10.3)
Chloride: 96 mmol/L — ABNORMAL LOW (ref 98–111)
Creatinine, Ser: 0.67 mg/dL (ref 0.44–1.00)
GFR, Estimated: >60 mL/min
Glucose, Bld: 149 mg/dL — ABNORMAL HIGH (ref 70–99)
Potassium: 3.5 mmol/L (ref 3.5–5.1)
Sodium: 142 mmol/L (ref 135–145)
Total Bilirubin: 0.3 mg/dL (ref 0.0–1.2)
Total Protein: 6.3 g/dL — ABNORMAL LOW (ref 6.5–8.1)

## 2025-01-05 LAB — MAGNESIUM: Magnesium: 1.9 mg/dL (ref 1.7–2.4)

## 2025-01-05 LAB — C-REACTIVE PROTEIN: CRP: <0.5 mg/dL

## 2025-01-05 LAB — PHOSPHORUS: Phosphorus: 1.9 mg/dL — ABNORMAL LOW (ref 2.5–4.6)

## 2025-01-05 LAB — SEDIMENTATION RATE: Sed Rate: 32 mm/h — ABNORMAL HIGH (ref 0–30)

## 2025-01-05 MED ORDER — POTASSIUM PHOSPHATES 15 MMOLE/5ML IV SOLN
30.0000 mmol | Freq: Once | INTRAVENOUS | Status: AC
  Administered 2025-01-05: 30 mmol via INTRAVENOUS
  Filled 2025-01-05: qty 10

## 2025-01-05 MED ORDER — GUAIFENESIN-DM 100-10 MG/5ML PO SYRP
5.0000 mL | ORAL_SOLUTION | ORAL | Status: DC | PRN
  Administered 2025-01-05 – 2025-01-06 (×3): 5 mL via ORAL
  Filled 2025-01-05 (×3): qty 10

## 2025-01-05 MED ORDER — IPRATROPIUM-ALBUTEROL 0.5-2.5 (3) MG/3ML IN SOLN: 3.0000 mL | Freq: Four times a day (QID) | RESPIRATORY_TRACT | Status: DC | PRN

## 2025-01-05 NOTE — Consult Note (Signed)
 "  Cardiology Consultation   Patient ID: Debra Hogan MRN: [id number]; DOB: March 10, 1961  Admit date: 01/02/2025 Date of Consult: 01/05/2025  PCP:  Dineen Rollene MATSU, FNP   [facility] Providers Cardiologist:  Deatrice Cage, MD    Patient Profile: Debra Hogan is a 64 y.o. female with a hx of nonobstructive CAD, coronary vasospasm, fibromyalgia, multiple sclerosis, tobacco use, COPD on O2, degenerative disc disease, chronic back pain, HLD, pulmonary HTN who is being seen 01/05/2025 for the evaluation of pericardial effusion at the request of Dr. Leesa.  History of Present Illness: Debra Hogan is followed by Dr. Cage for the above cardiac issues. She had a CT scan in [place] 2019 which showed small pericardial effusion. MPI in 2019 for chest pain showed no ischemia.  She continued to have symptoms and underwent left heart cath in October 2020, which showed mild proximal RCA stenosis with catheter induced spasm that improved with nitroglycerin .  No other obstructive disease.  Right heart cath showed mild pulmonary hypertension with moderately elevated RA pressures.  She was placed on diltiazem  for blood pressure and spasm with improvement of symptoms.MPI in 05/2023 showed no ischemia, normal LVEF, small pericardial effusion, low risk study. Echo 10/2023 LVEF 65-70%, G1DD, small effusion. Limited echo 05/2024 showed LVEF 60-65%, no WMA, small pericardial effusion.  Patient was last seen 10/02/2024 reporting worsening of chronic angina.  Patient has stable and chronic DOE.  She was still smoking half a pack per day on 2 L of oxygen .  Imdur  was increased from 30 to 60 mg.  The patient presented 1/9 for a fall, secondary to her MS.  Prior to arrival she tripped in fell, developing setting pain in her right ankle.  Upon arrival to the ED imaging revealed a trimalleolar fracture and associated tibiotalar dislocation/subluxation.  She was admitted and podiatry was consulted.  Initially podiatry plan for OR  on 1/9, but ultimately patient developed respiratory distress.  She underwent closed reduction and send with plan to focus on her respiratory status before attempting general anesthesia.  She required BiPAP through 1/9.  On 1/10 she tolerated transitioning to heated high flow.  Echocardiogram showed EF 55 to 60% with moderate pericardial effusion with no evidence of tamponade.  Cardiology was asked to see. On interview she denies any chest pain or lower leg edema.   Past Medical History:  Diagnosis Date   Anginal pain    Anxiety    Anxiety and depression    Arthritis    Asthma    Atherosclerosis of aorta    Cerebral aneurysm    Chronic bronchitis (HCC)    Chronic heart failure with preserved ejection fraction (HFpEF) (HCC)    a. 10/2023 Echo: EF 65-70%, GrI DD; b. 05/2024 Echo: EF 60-65%, no rwma, nl RV size/fxn, small circumferential pericardial effusion.  No significant valvular disease.   Chronic hypoxic respiratory failure, on home oxygen  therapy (HCC)    Chronic low back pain with sciatica    Chronic pain syndrome    Chronic, continuous use of opioids    COPD, very severe (HCC)    Coronary artery disease - non-obstructive    a. 09/2019 Cath: LM nl, LAD nl, LCX nl, RCA 40p (vasospasm noted ostial RCA). EF 65%-->ccb started.   Coronary vasospasm    a. 09/2019 Cath: ost RCA vasospasm-->ccb started.   DDD (degenerative disc disease), cervical    DDD (degenerative disc disease), lumbar    Depression    Dyspnea  emphasema   Fatigue    Fibromyalgia    Generalized anxiety disorder    Headache    Heat stroke 2006   Hypertension    Hypertension    Irritable bowel syndrome    MS (multiple sclerosis)    Followed by Dr. Maree   Myalgia    Myocardial infarction [facility])    Myositis    Neuropathic pain    On supplemental oxygen  therapy    on 3L [place]   Osteoarthritis of left shoulder 01/12/2022   PAH (pulmonary artery hypertension) (HCC)    a. 09/2019 RHC: RA 12, RV 42/14, PA 38/18  (27), PCWP 14. CO 4.6 L/min.   Pericardial effusion    a. 10/2023 Echo: small circumferential pericardial effusion without tamponade; b.  05/2024 Echo: Small circumferential pericardial effusion 1 cm of the RV and LV free wall.   Right-sided thoracic back pain    Sleep apnea (untreated)    Subclinical hypothyroidism    Tobacco abuse     Past Surgical History:  Procedure Laterality Date   ABDOMINAL HYSTERECTOMY     CERVIX intact; had for noncancerous reasons, endometriosis   CATARACT EXTRACTION W/PHACO Left 08/27/2024   Procedure: PHACOEMULSIFICATION, CATARACT, WITH IOL INSERTION 2.85, 00:19.0;  Surgeon: Mittie Gaskin, MD;  Location: [facility];  Service: Ophthalmology;  Laterality: Left;   CATARACT EXTRACTION W/PHACO Right 12/10/2024   Procedure: PHACOEMULSIFICATION, CATARACT, WITH IOL INSERTION 6.93, 01:01.4;  Surgeon: Mittie Gaskin, MD;  Location: [facility];  Service: Ophthalmology;  Laterality: Right;   CHOLECYSTECTOMY N/A 01/03/2016   Procedure: LAPAROSCOPIC CHOLECYSTECTOMY WITH INTRAOPERATIVE CHOLANGIOGRAM;  Surgeon: Charlie FORBES Fell, MD;  Location: [facility] ORS;  Service: General;  Laterality: N/A;   ENDOSCOPIC RETROGRADE CHOLANGIOPANCREATOGRAPHY (ERCP) WITH PROPOFOL  N/A 01/04/2016   Procedure: ENDOSCOPIC RETROGRADE CHOLANGIOPANCREATOGRAPHY (ERCP) WITH PROPOFOL ;  Surgeon: Rogelia Copping, MD;  Location: [facility];  Service: Endoscopy;  Laterality: N/A;   FOOT SURGERY  2001   LAPAROSCOPIC ENDOMETRIOSIS FULGURATION     RIGHT/LEFT HEART CATH AND CORONARY ANGIOGRAPHY Bilateral 09/29/2019   Procedure: RIGHT/LEFT HEART CATH AND CORONARY ANGIOGRAPHY;  Surgeon: Mady Bruckner, MD;  Location: [facility] INVASIVE [facility];  Service: Cardiovascular;  Laterality: Bilateral;     Home Medications:  Prior to Admission medications  Medication Sig Start Date End Date Taking? Authorizing Provider  acyclovir  (ZOVIRAX ) 400 MG tablet Take 1 tablet (400 mg total) by mouth daily.  04/29/24   Dineen Rollene MATSU, FNP  Albuterol  Sulfate (PROAIR  RESPICLICK) 108 (90 Base) MCG/ACT AEPB INHALE 1 PUFF BY MOUTH EVERY 6 HOURS AS NEEDED FOR COUGH 12/17/24   Dineen Rollene MATSU, FNP  ALPRAZolam  (XANAX ) 0.25 MG tablet Take 1 tablet by mouth twice daily as needed for anxiety 12/17/24   Dineen Rollene MATSU, FNP  aspirin  EC 81 MG tablet Take 81 mg by mouth daily.    [provider]  cholecalciferol  (VITAMIN D ) 1000 units tablet Take 1,000 Units by mouth daily.    [provider]  cyclobenzaprine  (FLEXERIL ) 10 MG tablet Take 10 mg by mouth at bedtime. 10/14/24   [provider]  cycloSPORINE (RESTASIS) 0.05 % ophthalmic emulsion 1 drop 2 (two) times daily. 11/21/23   [provider]  diltiazem  (CARDIZEM  CD) 180 MG 24 hr capsule TAKE 1 CAPSULE BY MOUTH ONCE DAILY 04/29/24   Dineen Rollene MATSU, FNP  escitalopram  (LEXAPRO ) 20 MG tablet Take 1 tablet (20 mg total) by mouth daily. 04/29/24   Dineen Rollene MATSU, FNP  fluticasone -salmeterol (ADVAIR) 250-50 MCG/ACT AEPB Inhale 1 puff into  the lungs in the morning and at bedtime. 09/29/24   Isadora Hose, MD  gabapentin  (NEURONTIN ) 100 MG capsule Take 1 capsule (100 mg total) by mouth 3 (three) times daily. 04/29/24   Dineen Rollene MATSU, FNP  isosorbide  mononitrate (IMDUR ) 60 MG 24 hr tablet Take 1 tablet (60 mg total) by mouth daily. 10/02/24   Vivienne Lonni Ingle, NP  lisinopril  (ZESTRIL ) 40 MG tablet Take 1 tablet (40 mg total) by mouth daily. 05/06/24   Dineen Rollene MATSU, FNP  Multiple Vitamins-Minerals (CENTRUM SILVER ULTRA WOMENS) TABS Take 1 tablet by mouth daily.    [provider]  nitroGLYCERIN  (NITROSTAT ) 0.4 MG SL tablet Place 1 tablet (0.4 mg total) under the tongue every 5 (five) minutes as needed for chest pain. 12/10/23 12/09/24  Rana Lum CROME, NP  ondansetron  (ZOFRAN -ODT) 4 MG disintegrating tablet Take 1 tablet (4 mg total) by mouth every 6 (six) hours as needed for nausea or vomiting.  01/06/21   Dineen Rollene MATSU, FNP  rosuvastatin  (CRESTOR ) 40 MG tablet Take 1 tablet (40 mg total) by mouth daily. 05/06/24   Dineen Rollene MATSU, FNP  Tiotropium Bromide  (SPIRIVA  RESPIMAT) 2.5 MCG/ACT AERS INHALE 2 SPRAY(S) BY MOUTH ONCE DAILY 12/15/24   Isadora Hose, MD    Scheduled Meds:  budesonide  (PULMICORT ) nebulizer solution  0.5 mg Nebulization BID   cholecalciferol   1,000 Units Oral Daily   cyclobenzaprine   10 mg Oral QHS   diltiazem   180 mg Oral Daily   escitalopram   20 mg Oral Daily   fluticasone  furoate-vilanterol  1 puff Inhalation Daily   furosemide   40 mg Intravenous Q12H   gabapentin   100 mg Oral TID   heparin  injection (subcutaneous)  5,000 Units Subcutaneous Q8H   ipratropium-albuterol   3 mL Nebulization Q6H   isosorbide  mononitrate  60 mg Oral Daily   lisinopril   40 mg Oral Daily   methylPREDNISolone  (SOLU-MEDROL ) injection  80 mg Intravenous Q12H   metoprolol  tartrate  12.5 mg Oral BID   multivitamin with minerals  1 tablet Oral Daily   rosuvastatin   40 mg Oral Daily   umeclidinium bromide   2 puff Inhalation Daily   Continuous Infusions:  potassium PHOSPHATE  IVPB (in mmol)     PRN Meds: acetaminophen  **OR** acetaminophen , ALPRAZolam , magnesium  hydroxide, morphine  injection, nitroGLYCERIN , ondansetron  **OR** ondansetron  (ZOFRAN ) IV, traZODone   Allergies:   Allergies[1]  Social History:   Social History   Socioeconomic History   Marital status: Married    Spouse name: Not on file   Number of children: Not on file   Years of education: Not on file   Highest education level: GED or equivalent  Occupational History   Not on file  Tobacco Use   Smoking status: Every Day    Current packs/day: 1.00    Average packs/day: 1 pack/day for 53.0 years (53.0 ttl pk-yrs)    Types: Cigarettes    Start date: 1973   Smokeless tobacco: Never   Tobacco comments:    0.5 PPD- khj 09/29/2024  Vaping Use   Vaping status: Never Used  Substance and Sexual Activity    Alcohol use: No    Alcohol/week: 0.0 standard drinks of alcohol   Drug use: Yes    Types: Marijuana, Cocaine    Comment: 30 years ago   Sexual activity: Not Currently    Birth control/protection: Post-menopausal  Other Topics Concern   Not on file  Social History Narrative   Lives in [place]. Widowed x 2.      [place]  Son died 11/2022 at 48 years of age.       Married for 14  years.       On disability      Social Drivers of Health   Tobacco Use: High Risk (01/02/2025)   Patient History    Smoking Tobacco Use: Every Day    Smokeless Tobacco Use: Never    Passive Exposure: Not on file  Financial Resource Strain: Medium Risk (10/22/2024)   Overall Financial Resource Strain (CARDIA)    Difficulty of Paying Living Expenses: Somewhat hard  Food Insecurity: No Food Insecurity (01/02/2025)   Epic    Worried About Programme Researcher, Broadcasting/film/video in the Last Year: Never true    Ran Out of Food in the Last Year: Never true  Transportation Needs: No Transportation Needs (01/02/2025)   Epic    Lack of Transportation (Medical): No    Lack of Transportation (Non-Medical): No  Physical Activity: Inactive (10/22/2024)   Exercise Vital Sign    Days of Exercise per Week: 0 days    Minutes of Exercise per Session: Not on file  Stress: No Stress Concern Present (10/22/2024)   [employer] of Occupational Health - Occupational Stress Questionnaire    Feeling of Stress: Not at all  Social Connections: Moderately Isolated (01/02/2025)   Social Connection and Isolation Panel    Frequency of Communication with Friends and Family: More than three times a week    Frequency of Social Gatherings with Friends and Family: Once a week    Attends Religious Services: Patient declined    Active Member of Clubs or Organizations: No    Attends Engineer, Structural: Not on file    Marital Status: Married  Intimate Partner Violence: Not At Risk (01/02/2025)   Epic    Fear of Current or Ex-Partner: No     Emotionally Abused: No    Physically Abused: No    Sexually Abused: No  Depression (PHQ2-9): Low Risk (10/22/2024)   Depression (PHQ2-9)    PHQ-2 Score: 0  Alcohol Screen: Low Risk (07/23/2024)   Alcohol Screen    Last Alcohol Screening Score (AUDIT): 0  Housing: Low Risk (01/02/2025)   Epic    Unable to Pay for Housing in the Last Year: No    Number of Times Moved in the Last Year: 0    Homeless in the Last Year: No  Utilities: Not At Risk (01/02/2025)   Epic    Threatened with loss of utilities: No  Health Literacy: Adequate Health Literacy (07/23/2024)   B1300 Health Literacy    Frequency of need for help with medical instructions: Never    Family History:    Family History  Problem Relation Age of Onset   Hypertension Mother    Hyperlipidemia Mother    Arthritis Mother    Cancer Father        throat   Heart disease Brother    Bipolar disorder Son    Schizophrenia Son    Cancer Maternal Grandfather        liver   Cancer Paternal Grandmother        liver     ROS:  Please see the history of present illness.   All other ROS reviewed and negative.     Physical Exam/Data: Vitals:   01/05/25 0446 01/05/25 0732 01/05/25 0912 01/05/25 0915  BP: 130/66 139/69    Pulse: 82   76  Resp: 20   (!) 21  Temp: 98.3 F (36.8  C) 98 F (36.7 C)    TempSrc:  Oral    SpO2: 96%  91% 95%  Weight:      Height:        Intake/Output Summary (Last 24 hours) at 01/05/2025 0942 Last data filed at 01/04/2025 1432 Gross per 24 hour  Intake 0 ml  Output --  Net 0 ml      01/02/2025    1:51 AM 12/10/2024    8:59 AM 11/17/2024    1:50 PM  Last 3 Weights  Weight (lbs) 130 lb 140 lb 148 lb  Weight (kg) 58.968 kg 63.504 kg 67.132 kg     Body mass index is 21.63 kg/m.  General:  Well nourished, well developed, in no acute distress HEENT: normal Neck: no JVD Vascular: No carotid bruits; Distal pulses 2+ bilaterally Cardiac:  normal S1, S2; RRR; no murmur  Lungs:  diffusely  diminished  Abd: soft, nontender, no hepatomegaly  Ext: no edema Musculoskeletal:  No deformities, BUE and BLE strength normal and equal Skin: warm and dry  Neuro:  CNs 2-12 intact, no focal abnormalities noted Psych:  Normal affect   EKG:  The EKG was personally reviewed and demonstrates:  N/A Telemetry:  Telemetry was personally reviewed and demonstrates:  NSR HR 80s, PVCs  Relevant CV Studies:  Echo 01/04/25  1. Left ventricular ejection fraction, by estimation, is 55 to 60%. Left  ventricular ejection fraction by 3D volume is 59 %. The left ventricle has  normal function. The left ventricle has no regional wall motion  abnormalities. There is mild asymmetric  left ventricular hypertrophy of the septal segment. Indeterminate  diastolic filling due to E-A fusion. The average left ventricular global  longitudinal strain is -20.5 %. The global longitudinal strain is normal.   2. Right ventricular systolic function is normal. The right ventricular  size is normal. There is mildly elevated pulmonary artery systolic  pressure.   3. Pericardial effusion compared side by side with the echo 05/2024 and is  unchanged in size. Measures 1.2 cm. Moderate pericardial effusion. There  is no evidence of cardiac tamponade.   4. The mitral valve is normal in structure. No evidence of mitral valve  regurgitation. No evidence of mitral stenosis.   5. The aortic valve is tricuspid. Aortic valve regurgitation is not  visualized. No aortic stenosis is present.   6. The inferior vena cava is normal in size with <50% respiratory  variability, suggesting right atrial pressure of 8 mmHg.   Echo 05/2024  1. Left ventricular ejection fraction, by estimation, is 60 to 65%. The  left ventricle has normal function. The left ventricle has no regional  wall motion abnormalities. Left ventricular diastolic parameters are  indeterminate.   2. Right ventricular systolic function is normal. The right ventricular   size is normal.   3. A small pericardial effusion is present. The pericardial effusion is  circumferential. 1 cm off the RV and LV free wall.   4. The mitral valve is normal in structure. No evidence of mitral valve  regurgitation. No evidence of mitral stenosis.   5. The aortic valve is normal in structure. Aortic valve regurgitation is  not visualized. No aortic stenosis is present.   6. The inferior vena cava is normal in size with greater than 50%  respiratory variability, suggesting right atrial pressure of 3 mmHg.   Echo 10/2023 1. Left ventricular ejection fraction, by estimation, is 65 to 70%. The  left ventricle has normal function.  The left ventricle has no regional  wall motion abnormalities. Left ventricular diastolic parameters are  consistent with Grade I diastolic  dysfunction (impaired relaxation).   2. Right ventricular systolic function is normal. The right ventricular  size is normal.   3. A small pericardial effusion is present. The pericardial effusion is  circumferential. There is no evidence of cardiac tamponade.   4. The mitral valve is normal in structure. Trivial mitral valve  regurgitation.   5. The aortic valve is tricuspid. Aortic valve regurgitation is not  visualized.   6. The inferior vena cava is normal in size with <50% respiratory  variability, suggesting right atrial pressure of 8 mmHg.   MPI 05/2023 Narrative & Impression      Normal pharmacologic myocardial perfusion stress test without significant ischemia or scar.   Left ventricular systolic function is normal (LVEF > 65%).   There is no significant coronary artery calcification noted on the attenuation correction CT.  Aortic atherosclerosis is noted.   Incidental note is made of a small pericardial effusion.   This is a low risk study.    Echo 04/2020 1. Left ventricular ejection fraction, by estimation, is 60 to 65%. The  left ventricle has normal function. The left ventricle has no  regional  wall motion abnormalities. Left ventricular diastolic parameters are  consistent with Grade I diastolic  dysfunction (impaired relaxation).   2. Right ventricular systolic function is normal. The right ventricular  size is normal. There is mildly elevated pulmonary artery systolic  pressure.   3. The mitral valve is normal in structure. No evidence of mitral valve  regurgitation. No evidence of mitral stenosis.   4. The aortic valve is normal in structure. Aortic valve regurgitation is  not visualized. No aortic stenosis is present.   5. The inferior vena cava is normal in size with greater than 50%  respiratory variability, suggesting right atrial pressure of 3 mmHg.   Comparison(s): EF 60-65%.    R/L heart cath 09/2019 Conclusions: No significant atherosclerotic CAD.  Vasospasm noted at the ostial RCA, which improved with IC NTG. Hyperdynamic LVEF with upper normal left heart filling pressures. Mild pulmonary hypertension. Moderately elevated right heart filling pressures. Normal Fick cardiac output/index.   Recommendations: Medical therapy; will start low-dose diltiazem  and prn sublingual NTG. Primary prevention of CAD. Continued treatment of underlying pulmonary disease.   Lonni Hanson, MD [facility] Pager: [phone]   Echo 11/2018 Study Conclusions   - Left ventricle: The cavity size was normal. Systolic function was    normal. The estimated ejection fraction was in the range of 60%    to 65%. Wall motion was normal; there were no regional wall    motion abnormalities. Doppler parameters are consistent with    abnormal left ventricular relaxation (grade 1 diastolic    dysfunction).  - Left atrium: The atrium was normal in size.  - Right ventricle: Systolic function was normal.  - Pulmonary arteries: Systolic pressure was moderately elevated. PA    peak pressure: 49 mm Hg (S).    Laboratory Data: High Sensitivity Troponin:  No results for  input(s): TROPONINIHS in the last 720 hours. No results for input(s): TRNPT in the last 720 hours.    Chemistry Recent Labs  Lab 01/02/25 1600 01/03/25 0522 01/05/25 0513  NA 142 142 142  K 4.0 4.2 3.5  CL 96* 97* 96*  CO2 41* 38* 38*  GLUCOSE 171* 133* 149*  BUN 18 21 25*  CREATININE  0.64 0.55 0.67  CALCIUM  9.7 9.5 9.7  MG  --   --  1.9  GFRNONAA >60 >60 >60  ANIONGAP 6 7 8     Recent Labs  Lab 01/05/25 0513  PROT 6.3*  ALBUMIN 3.8  AST 27  ALT 15  ALKPHOS 65  BILITOT 0.3   Lipids No results for input(s): CHOL, TRIG, HDL, LABVLDL, LDLCALC, CHOLHDL in the last 168 hours.  Hematology Recent Labs  Lab 01/02/25 1600 01/03/25 0522 01/05/25 0513  WBC 9.4 7.7 9.7  RBC 3.09* 3.08* 3.12*  HGB 9.8* 9.8* 9.9*  HCT 31.5* 30.8* 29.8*  MCV 101.9* 100.0 95.5  MCH 31.7 31.8 31.7  MCHC 31.1 31.8 33.2  RDW 12.1 11.9 12.6  PLT 174 193 221   Thyroid  No results for input(s): TSH, FREET4 in the last 168 hours.  BNPNo results for input(s): BNP, PROBNP in the last 168 hours.  DDimer No results for input(s): DDIMER in the last 168 hours.  Radiology/Studies:  ECHOCARDIOGRAM COMPLETE Result Date: 01/04/2025    ECHOCARDIOGRAM REPORT   Patient Name:   DREYA BUHRMAN Buddenhagen Date of Exam: 01/04/2025 Medical Rec #:  [id number]    Height:       65.0 in Accession #:    [id number]   Weight:       130.0 lb Date of Birth:  [birth year]    BSA:          1.647 m Patient Age:    63 years     BP:           128/65 mmHg Patient Gender: F            HR:           112 bpm. Exam Location:  [facility] Procedure: 2D Echo, 3D Echo, Cardiac Doppler, Color Doppler and Strain Analysis            (Both Spectral and Color Flow Doppler were utilized during            procedure). STAT ECHO Indications:     Pericardial Effusion  History:         Patient has prior history of Echocardiogram examinations, most                  recent 06/12/2024. CHF, Angina, CAD and Previous Myocardial                  Infarction,  Pulmonary HTN, Signs/Symptoms:Dyspnea and Fatigue;                  Risk Factors:Hypertension, Current Smoker and Sleep Apnea.  Sonographer:     Logan Shove RDCS Referring Phys:  [id number] LORANE POLAND Diagnosing Phys: Annabella Scarce MD IMPRESSIONS  1. Left ventricular ejection fraction, by estimation, is 55 to 60%. Left ventricular ejection fraction by 3D volume is 59 %. The left ventricle has normal function. The left ventricle has no regional wall motion abnormalities. There is mild asymmetric left ventricular hypertrophy of the septal segment. Indeterminate diastolic filling due to E-A fusion. The average left ventricular global longitudinal strain is -20.5 %. The global longitudinal strain is normal.  2. Right ventricular systolic function is normal. The right ventricular size is normal. There is mildly elevated pulmonary artery systolic pressure.  3. Pericardial effusion compared side by side with the echo 05/2024 and is unchanged in size. Measures 1.2 cm. Moderate pericardial effusion. There is no evidence of cardiac tamponade.  4. The mitral valve is normal in structure. No  evidence of mitral valve regurgitation. No evidence of mitral stenosis.  5. The aortic valve is tricuspid. Aortic valve regurgitation is not visualized. No aortic stenosis is present.  6. The inferior vena cava is normal in size with <50% respiratory variability, suggesting right atrial pressure of 8 mmHg. FINDINGS  Left Ventricle: Left ventricular ejection fraction, by estimation, is 55 to 60%. Left ventricular ejection fraction by 3D volume is 59 %. The left ventricle has normal function. The left ventricle has no regional wall motion abnormalities. The average left ventricular global longitudinal strain is -20.5 %. Strain was performed and the global longitudinal strain is normal. The left ventricular internal cavity size was normal in size. There is mild asymmetric left ventricular hypertrophy of the septal segment. Indeterminate  diastolic filling due to E-A fusion. Right Ventricle: The right ventricular size is normal. No increase in right ventricular wall thickness. Right ventricular systolic function is normal. There is mildly elevated pulmonary artery systolic pressure. The tricuspid regurgitant velocity is 2.74  m/s, and with an assumed right atrial pressure of 8 mmHg, the estimated right ventricular systolic pressure is 38.0 mmHg. Left Atrium: Left atrial size was normal in size. Right Atrium: Right atrial size was normal in size. Pericardium: Pericardial effusion compared side by side with the echo 05/2024 and is unchanged in size. Measures 1.2 cm. A moderately sized pericardial effusion is present. There is diastolic collapse of the right atrial wall. There is no evidence of cardiac tamponade. Mitral Valve: The mitral valve is normal in structure. No evidence of mitral valve regurgitation. No evidence of mitral valve stenosis. Tricuspid Valve: The tricuspid valve is normal in structure. Tricuspid valve regurgitation is trivial. No evidence of tricuspid stenosis. Aortic Valve: The aortic valve is tricuspid. Aortic valve regurgitation is not visualized. No aortic stenosis is present. Aortic valve peak gradient measures 8.6 mmHg. Pulmonic Valve: The pulmonic valve was normal in structure. Pulmonic valve regurgitation is not visualized. No evidence of pulmonic stenosis. Aorta: The aortic root is normal in size and structure. Venous: The inferior vena cava is normal in size with less than 50% respiratory variability, suggesting right atrial pressure of 8 mmHg. IAS/Shunts: No atrial level shunt detected by color flow Doppler. Additional Comments: 3D was performed not requiring image post processing on an independent workstation and was normal.  LEFT VENTRICLE PLAX 2D LVIDd:         3.90 cm         Diastology LVIDs:         2.40 cm         LV e' medial:  12.60 cm/s LV PW:         0.80 cm         LV e' lateral: 21.70 cm/s LV IVS:        1.10  cm LVOT diam:     2.00 cm         2D Longitudinal LVOT Area:     3.14 cm        Strain                                2D Strain GLS   -20.4 %                                (A4C):  2D Strain GLS   -20.9 %                                (A3C):                                2D Strain GLS   -20.1 %                                (A2C):                                2D Strain GLS   -20.5 %                                Avg:                                 3D Volume EF                                LV 3D EF:    Left                                             ventricul                                             ar                                             ejection                                             fraction                                             by 3D                                             volume is                                             59 %.  3D Volume EF:                                3D EF:        59 %                                LV EDV:       78 ml                                LV ESV:       32 ml                                LV SV:        46 ml RIGHT VENTRICLE             IVC RV Basal diam:  3.10 cm     IVC diam: 1.40 cm RV S prime:     17.44 cm/s TAPSE (M-mode): 1.9 cm LEFT ATRIUM             Index        RIGHT ATRIUM           Index LA diam:        3.30 cm 2.00 cm/m   RA Area:     12.30 cm LA Vol (A2C):   42.3 ml 25.68 ml/m  RA Volume:   29.40 ml  17.85 ml/m LA Vol (A4C):   30.9 ml 18.76 ml/m LA Biplane Vol: 37.2 ml 22.58 ml/m  AORTIC VALVE AV Area (Vmax): 2.82 cm AV Vmax:        147.00 cm/s AV Peak Grad:   8.6 mmHg LVOT Vmax:      131.75 cm/s  AORTA Ao Root diam: 2.20 cm Ao Asc diam:  2.70 cm TRICUSPID VALVE TR Peak grad:   30.0 mmHg TR Vmax:        274.00 cm/s  SHUNTS Systemic Diam: 2.00 cm Annabella Scarce MD Electronically signed by Annabella Scarce MD Signature Date/Time: 01/04/2025/12:28:37 PM    Final     CT Angio Chest Pulmonary Embolism (PE) W or WO Contrast Result Date: 01/03/2025 EXAM: CTA of the Chest with contrast for PE 01/03/2025 08:27:07 PM TECHNIQUE: CTA of the chest was performed after the administration of 75 mL of intravenous contrast (iohexol  (OMNIPAQUE ) 350 MG/ML injection 75 mL IOHEXOL  350 MG/ML SOLN). Multiplanar reformatted images are provided for review. MIP images are provided for review. Automated exposure control, iterative reconstruction, and/or weight based adjustment of the mA/kV was utilized to reduce the radiation dose to as low as reasonably achievable. COMPARISON: 05/05/2024 CLINICAL HISTORY: Pulmonary embolism (PE) high prob; persistent hypoxia. Known emphysema, and MS. DVT evaluation. FINDINGS: PULMONARY ARTERIES: Pulmonary arteries are adequately opacified for evaluation. No pulmonary embolism. Main pulmonary artery is normal in caliber. MEDIASTINUM: Mild coronary artery calcification. Global cardiac size within normal limits. Moderate pericardial effusion, enlarged since prior examination. Mild atherosclerotic calcification within the thoracic aorta. There is no acute abnormality of the thoracic aorta. LYMPH NODES: No mediastinal, hilar or axillary lymphadenopathy. LUNGS AND PLEURA: Moderate emphysema. Bibasilar atelectasis. Small left pleural effusion. No pneumothorax. UPPER ABDOMEN: Limited images of the upper abdomen are unremarkable. SOFT TISSUES AND BONES: No acute bone or soft  tissue abnormality. IMPRESSION: 1. No pulmonary embolism. 2. Moderate pericardial effusion, enlarged since prior examination. 3. Moderate emphysema, and pulmonary emphysema is an independent risk factor for lung cancer; recommend consideration for evaluation for a low-dose CT lung cancer screening program. 4. Mild coronary artery calcification. 5. Raf score includes aortic atherosclerosis (ICD10-I70.0) and emphysema (ICD10-J43.9). Electronically signed by: Dorethia Molt MD MD 01/03/2025 08:45 PM EST  RP Workstation: [id number]   DG Chest Port 1 View Result Date: 01/02/2025 CLINICAL DATA:  Hypoxia. EXAM: PORTABLE CHEST 1 VIEW COMPARISON:  Earlier today FINDINGS: Stable cardiomegaly. Unchanged emphysema, hyperinflation with central bronchial thickening. No focal airspace disease, pleural effusion, pulmonary edema or pneumothorax. Stable osseous structures. IMPRESSION: 1. Stable cardiomegaly. 2. Unchanged emphysema, hyperinflation and central bronchial thickening. Electronically Signed   By: Andrea Gasman M.D.   On: 01/02/2025 16:02   DG Ankle 2 Views Right Result Date: 01/02/2025 CLINICAL DATA:  Elective surgery. EXAM: RIGHT ANKLE - 2 VIEW COMPARISON:  Radiograph earlier today FINDINGS: Two fluoroscopic spot views of the ankle submitted from the operating room. Improved alignment of trimalleolar fracture post splint placement. Improved mortise alignment. Fluoroscopy time 13.9 seconds. Fluoroscopy dose not provided. IMPRESSION: Fluoroscopic spot views during reduction of trimalleolar fracture. Electronically Signed   By: Andrea Gasman M.D.   On: 01/02/2025 16:02   CT Ankle Right Wo Contrast Result Date: 01/02/2025 CLINICAL DATA:  Ankle trauma. EXAM: CT OF THE RIGHT ANKLE WITHOUT CONTRAST TECHNIQUE: Multidetector CT imaging of the right ankle was performed according to the standard protocol. Multiplanar CT image reconstructions were also generated. RADIATION DOSE REDUCTION: This exam was performed according to the departmental dose-optimization program which includes automated exposure control, adjustment of the mA and/or kV according to patient size and/or use of iterative reconstruction technique. COMPARISON:  Earlier same day radiographs. FINDINGS: Bones/Joint/Cartilage Redemonstrated trimalleolar ankle fracture with tibiotalar dislocation/subluxation. Comminuted oblique transsyndesmotic fracture of the distal fibular metaphysis with up to 6 mm of posterior displacement and 8 mm of lateral  displacement of the distal fracture component. Associated lateral angulation. A 1.6 x 1.4 x 0.7 cm fracture fragment is anteromedially displaced between the anterior distal margin of the proximal fibular fracture component and the adjacent distal anterolateral tibia (series 3, images 115-121; series 6, images 77-81; series 7, image 56). Smaller additional surrounding punctate osseous fragments. Medial malleolus fracture demonstrates 7 mm of lateral displacement. Posterior malleolar fracture demonstrates 4 mm of superior displacement proximally and up to 7 mm of posterior displacement/distraction laterally. Fracture margins extend to the tibial plafond with mild cortical surface step-off and up to 4 mm of distraction at the posterior articular surface (series 7, images 66 and 67). Persistent lateral tibiotalar subluxation/dislocation. The medial cortical fracture margin of the distal tibial metaphysis abuts the overlying skin. No additional fracture identified. Postoperative changes of the first metatarsal. Small plantar calcaneal spur. Soft tissue Overlying soft tissue splint in place. Diffuse subcutaneous edema and soft tissue swelling of the ankle, most pronounced laterally and medially extending through the dorsal foot. The posterior tibialis and flexor digitorum longus tendons abut the medial margin of the posterior malleolar fracture without convincing evidence of entrapment. IMPRESSION: 1. Redemonstrated trimalleolar ankle fracture with persistent lateral tibiotalar subluxation/dislocation. The medial cortical fracture margin of the distal tibial metaphysis abuts the overlying skin. 2. Comminuted oblique transsyndesmotic fracture of the distal fibular metaphysis with up to 6 mm of posterior displacement and 8 mm of lateral displacement of the distal fracture component. Associated lateral angulation. 3. A 1.6 x 1.4 x  0.7 cm fracture fragment is displaced between the anterior distal margin of the proximal  fibular fracture component and the adjacent distal anterolateral tibia. Smaller additional surrounding punctate osseous fragments. 4. Medial malleolus fracture demonstrates 7 mm of lateral displacement. 5. Posterior malleolar fracture demonstrates 4 mm of superior displacement proximally and up to 7 mm of posterior displacement/distraction laterally. Fracture margins extend to the tibial plafond with mild cortical surface step-off and up to 4 mm of distraction at the posterior articular surface. 6. The posterior tibialis and flexor digitorum longus tendons abut the medial margin of the posterior malleolar fracture without convincing evidence of entrapment. 7. Diffuse subcutaneous edema and soft tissue swelling of the ankle, most pronounced laterally and medially extending through the dorsal foot. Electronically Signed   By: Harrietta Sherry M.D.   On: 01/02/2025 11:33   DG C-Arm 1-60 Min-No Report Result Date: 01/02/2025 Fluoroscopy was utilized by the requesting physician.  No radiographic interpretation.   DG Chest Port 1 View Result Date: 01/02/2025 CLINICAL DATA:  Hypoxia EXAM: PORTABLE CHEST 1 VIEW COMPARISON:  July 22, 2018. FINDINGS: Stable cardiomegaly. Emphysematous disease is noted. No acute pulmonary abnormality is noted. Bony thorax is unremarkable. IMPRESSION: No active disease. Electronically Signed   By: Lynwood Landy Raddle M.D.   On: 01/02/2025 08:45   US  OR NERVE BLOCK-IMAGE ONLY [facility] [place] [place]) Result Date: 01/02/2025 There is no interpretation for this exam.  This order is for images obtained during a surgical procedure.  Please See Surgeries Tab for more information regarding the procedure.   DG Ankle 2 Views Right Result Date: 01/02/2025 EXAM: 2 VIEW(S) XRAY OF THE RIGHT ANKLE 01/02/2025 02:18:00 AM CLINICAL HISTORY: Injury COMPARISON: None available. FINDINGS: BONES AND JOINTS: Laterally displaced, angulated distal fibular fracture. Mildly displaced medial malleolar fracture. Suspected posterior  malleolar fracture, although poorly visualized. Tibiotalar anterolateral dislocation/subluxation. SOFT TISSUES: Associated soft tissue swelling. IMPRESSION: 1. Suspected trimalleolar fracture, although posterior component poorly visualized. 2. Associated tibiotalar dislocation/subluxation. Electronically signed by: Pinkie Pebbles MD MD 01/02/2025 02:24 AM EST RP Workstation: [id number]     Assessment and Plan:  Pericardial effusion - h/o of small pericardial effusion dating back to 2024 - echo 05/2024 showed normal EF with small pericardial effusion, circumferential, 1 cm of RV and LV free wall - Recent echo showed normal EF with moderate effusion, measuring 1.2 cm, no evidence of tamponade - started on IV Lasix  40mg  BID, likely does not need high lasix  dose - Recommend continue following with serial echocardiograms  CAD Coronary vasospam - she denies chest pain - continue dil,t 280mg  daily, Imdur  60mg  daily, Crestor  40mg  daily, Lopressor  12.5mg  BID  Acute on chronic Respiratory failure Pulmonary HTN COPD Tobacco abuse OSA - was on Bipap, now on HFNC - CTA negative for PE - pulm consulted  Trimalleolar fracture - s/p reduction - plan to optimize respiratory status prior to anesthesia  MS Fibromyalgia Chronic pain - PTA Percocet  HLD - LDL 50 - continue Crestor  40 mg daily  For questions or updates, please contact [facility] Please consult [email or web address] for contact info under      Signed, Vasco Chong VEAR Fishman, PA-C  01/05/2025 9:42 AM     [1]  Allergies Allergen Reactions   Valium [Diazepam] Anxiety   "

## 2025-01-05 NOTE — Progress Notes (Signed)
 " PROGRESS NOTE    Linsi Humann Salser  FMW:[id number] DOB: February 28, 1961 DOA: 01/02/2025 PCP: Dineen Rollene MATSU, FNP  Chief Complaint  Patient presents with   Ankle Pain    Hospital Course:  Argentina Kosch Junker is a 64 year old female with extensive past medical history including chronic hypoxic respiratory failure on 3 L at home, COPD, MS, chronic pain with opioid dependence, hypertension, hyperlipidemia.  Patient has frequent falls secondary to her MS.  Prior to arrival she tripped and developed sudden pain in her right ankle.  On arrival to the ED imaging revealed trimalleolar fracture and associated tibiotalar dislocation/subluxation.  She was admitted and podiatry was consulted.  Initially podiatry planned for the OR on 1/9 but ultimately patient developed respiratory distress.  She underwent closed reduction instead with plan to focus on her respiratory status before attempting general anesthesia. She required BiPAP through much of 1/9.  On 1/10 she tolerated transition to heated high flow.  Subjective: Continues to improve.  Denies any issues overnight.  Anxious to have heated high flow removed  Objective: Vitals:   01/05/25 0912 01/05/25 0915 01/05/25 1231 01/05/25 1349  BP:   130/71   Pulse:  76  (!) 102  Resp:  (!) 21  14  Temp:   98.4 F (36.9 C)   TempSrc:   Oral   SpO2: 91% 95%  93%  Weight:      Height:       No intake or output data in the 24 hours ending 01/05/25 1512  Filed Weights   01/02/25 0151  Weight: 59 kg    Examination: General exam: Appears calm and comfortable, NAD  Respiratory system: On heated high flow, no wheeze today.  Better aeration bilaterally Cardiovascular system: S1 & S2 heard, RRR.  Gastrointestinal system: Abdomen is nondistended, soft and nontender.  Neuro: Alert and oriented x3  Assessment & Plan:  Principal Problem:   Closed displaced trimalleolar fracture of right ankle, initial encounter Active Problems:   Anxiety and depression   Essential  hypertension   Chronic obstructive pulmonary disease (COPD) (HCC)   Dyslipidemia    Acute on chronic hypercapnic respiratory failure COPD with exacerbation - Chronically on 3 L, has required BiPAP.  ABG 1/9 with 7.4 2/79/58/51 - Has done well on heated high flow, continue weaning off as she tolerates.  Have discussed with respiratory who reports that she has had difficulty weaning down.  Continuing to work on it - CXR without evidence of new infiltrate - Continue treatment for COPD exacerbation - Continue steroids and DuoNebs - CTA negative for PE.  Does reveal pericardial effusion - Have consulted with pulmonology - Continue home dose inhalers, steroids, DuoNebs - Follows outpatient pulmonology Dr. Hardy  Pericardial effusion - Better evaluated on echocardiogram 1/11 which demonstrates size is largely unchanged from prior in June 2025.  No evidence of tamponade - Initiated on Lasix  but appears to becoming dry.  Will discontinue - Cardiology was consulted and recommends serial echocardiograms  Trimalleolar fracture - Status post reduction with podiatry - Continue nonweightbearing to the right lower extremity - Ankle fracture remains unstable and displaced. - Continues to require medical optimization in respiratory status and cardiac status prior to undergoing anesthesia.  Hopefully she will be more stable by Wednesday and we will aim for surgery at that time.  Will continue to keep the podiatry team updated  Multiple sclerosis Fibromyalgia Chronic pain Opioid dependence - Resume home meds.  Takes daily Percocet as well as 3 times daily  alprazolam  - MS followed outpatient by Dr. Maree  Pulmonary arterial hypertension - Resume home meds.  Continue outpatient follow-up  Sleep apnea - Continue with CPAP/BiPAP at night is much as patient will allow  Current tobacco abuse - Has been counseled on cessation  CAD History of coronary vasospasm Congestive heart failure with  preserved EF - Continue home meds - Getting diuresis as above  Dyslipidemia - Continue statin  Essential hypertension - Continue home meds, titrate as needed  Anxiety and depression - Continue home meds  DVT prophylaxis: Heparin    Code Status: Full Code Disposition: Inpatient, still pending clinical resolution.  Not medically optimized for surgery yet  Consultants:  Treatment Team:  Consulting Physician: Lennie Barter, DPM Consulting Physician: Raford Riggs, MD Consulting Physician: Darron Deatrice LABOR, MD  Procedures:    Antimicrobials:  Anti-infectives (From admission, onward)    Start     Dose/Rate Route Frequency Ordered Stop   01/02/25 0800  ceFAZolin  (ANCEF ) IVPB 2g/100 mL premix  Status:  Discontinued        2 g 200 mL/hr over 30 Minutes Intravenous On call to O.R. 01/02/25 0749 01/02/25 1502       Data Reviewed: I have personally reviewed following labs and imaging studies CBC: Recent Labs  Lab 01/02/25 0231 01/02/25 1600 01/03/25 0522 01/05/25 0513  WBC 10.1 9.4 7.7 9.7  NEUTROABS 8.6*  --   --   --   HGB 10.3* 9.8* 9.8* 9.9*  HCT 33.1* 31.5* 30.8* 29.8*  MCV 102.5* 101.9* 100.0 95.5  PLT 194 174 193 221   Basic Metabolic Panel: Recent Labs  Lab 01/02/25 0231 01/02/25 1600 01/03/25 0522 01/05/25 0513  NA 142 142 142 142  K 4.0 4.0 4.2 3.5  CL 95* 96* 97* 96*  CO2 41* 41* 38* 38*  GLUCOSE 128* 171* 133* 149*  BUN 18 18 21  25*  CREATININE 0.87 0.64 0.55 0.67  CALCIUM  9.8 9.7 9.5 9.7  MG  --   --   --  1.9  PHOS  --   --   --  1.9*   GFR: Estimated Creatinine Clearance: 63.1 mL/min (by C-G formula based on SCr of 0.67 mg/dL). Liver Function Tests: Recent Labs  Lab 01/05/25 0513  AST 27  ALT 15  ALKPHOS 65  BILITOT 0.3  PROT 6.3*  ALBUMIN 3.8   CBG: No results for input(s): GLUCAP in the last 168 hours.  No results found for this or any previous visit (from the past 240 hours).   Radiology Studies: ECHOCARDIOGRAM  COMPLETE Result Date: 01/04/2025    ECHOCARDIOGRAM REPORT   Patient Name:   Debra Hogan Date of Exam: 01/04/2025 Medical Rec #:  [id number]    Height:       65.0 in Accession #:    [id number]   Weight:       130.0 lb Date of Birth:  [birth year]    BSA:          1.647 m Patient Age:    63 years     BP:           128/65 mmHg Patient Gender: F            HR:           112 bpm. Exam Location:  [facility] Procedure: 2D Echo, 3D Echo, Cardiac Doppler, Color Doppler and Strain Analysis            (Both Spectral and Color Flow Doppler were utilized during  procedure). STAT ECHO Indications:     Pericardial Effusion  History:         Patient has prior history of Echocardiogram examinations, most                  recent 06/12/2024. CHF, Angina, CAD and Previous Myocardial                  Infarction, Pulmonary HTN, Signs/Symptoms:Dyspnea and Fatigue;                  Risk Factors:Hypertension, Current Smoker and Sleep Apnea.  Sonographer:     Logan Shove RDCS Referring Phys:  [id number] LORANE POLAND Diagnosing Phys: Annabella Scarce MD IMPRESSIONS  1. Left ventricular ejection fraction, by estimation, is 55 to 60%. Left ventricular ejection fraction by 3D volume is 59 %. The left ventricle has normal function. The left ventricle has no regional wall motion abnormalities. There is mild asymmetric left ventricular hypertrophy of the septal segment. Indeterminate diastolic filling due to E-A fusion. The average left ventricular global longitudinal strain is -20.5 %. The global longitudinal strain is normal.  2. Right ventricular systolic function is normal. The right ventricular size is normal. There is mildly elevated pulmonary artery systolic pressure.  3. Pericardial effusion compared side by side with the echo 05/2024 and is unchanged in size. Measures 1.2 cm. Moderate pericardial effusion. There is no evidence of cardiac tamponade.  4. The mitral valve is normal in structure. No evidence of mitral valve regurgitation. No  evidence of mitral stenosis.  5. The aortic valve is tricuspid. Aortic valve regurgitation is not visualized. No aortic stenosis is present.  6. The inferior vena cava is normal in size with <50% respiratory variability, suggesting right atrial pressure of 8 mmHg. FINDINGS  Left Ventricle: Left ventricular ejection fraction, by estimation, is 55 to 60%. Left ventricular ejection fraction by 3D volume is 59 %. The left ventricle has normal function. The left ventricle has no regional wall motion abnormalities. The average left ventricular global longitudinal strain is -20.5 %. Strain was performed and the global longitudinal strain is normal. The left ventricular internal cavity size was normal in size. There is mild asymmetric left ventricular hypertrophy of the septal segment. Indeterminate diastolic filling due to E-A fusion. Right Ventricle: The right ventricular size is normal. No increase in right ventricular wall thickness. Right ventricular systolic function is normal. There is mildly elevated pulmonary artery systolic pressure. The tricuspid regurgitant velocity is 2.74  m/s, and with an assumed right atrial pressure of 8 mmHg, the estimated right ventricular systolic pressure is 38.0 mmHg. Left Atrium: Left atrial size was normal in size. Right Atrium: Right atrial size was normal in size. Pericardium: Pericardial effusion compared side by side with the echo 05/2024 and is unchanged in size. Measures 1.2 cm. A moderately sized pericardial effusion is present. There is diastolic collapse of the right atrial wall. There is no evidence of cardiac tamponade. Mitral Valve: The mitral valve is normal in structure. No evidence of mitral valve regurgitation. No evidence of mitral valve stenosis. Tricuspid Valve: The tricuspid valve is normal in structure. Tricuspid valve regurgitation is trivial. No evidence of tricuspid stenosis. Aortic Valve: The aortic valve is tricuspid. Aortic valve regurgitation is not  visualized. No aortic stenosis is present. Aortic valve peak gradient measures 8.6 mmHg. Pulmonic Valve: The pulmonic valve was normal in structure. Pulmonic valve regurgitation is not visualized. No evidence of pulmonic stenosis. Aorta: The aortic root is normal in  size and structure. Venous: The inferior vena cava is normal in size with less than 50% respiratory variability, suggesting right atrial pressure of 8 mmHg. IAS/Shunts: No atrial level shunt detected by color flow Doppler. Additional Comments: 3D was performed not requiring image post processing on an independent workstation and was normal.  LEFT VENTRICLE PLAX 2D LVIDd:         3.90 cm         Diastology LVIDs:         2.40 cm         LV e' medial:  12.60 cm/s LV PW:         0.80 cm         LV e' lateral: 21.70 cm/s LV IVS:        1.10 cm LVOT diam:     2.00 cm         2D Longitudinal LVOT Area:     3.14 cm        Strain                                2D Strain GLS   -20.4 %                                (A4C):                                2D Strain GLS   -20.9 %                                (A3C):                                2D Strain GLS   -20.1 %                                (A2C):                                2D Strain GLS   -20.5 %                                Avg:                                 3D Volume EF                                LV 3D EF:    Left                                             ventricul  ar                                             ejection                                             fraction                                             by 3D                                             volume is                                             59 %.                                 3D Volume EF:                                3D EF:        59 %                                LV EDV:       78 ml                                LV ESV:       32 ml                                 LV SV:        46 ml RIGHT VENTRICLE             IVC RV Basal diam:  3.10 cm     IVC diam: 1.40 cm RV S prime:     17.44 cm/s TAPSE (M-mode): 1.9 cm LEFT ATRIUM             Index        RIGHT ATRIUM           Index LA diam:        3.30 cm 2.00 cm/m   RA Area:     12.30 cm LA Vol (A2C):   42.3 ml 25.68 ml/m  RA Volume:   29.40 ml  17.85 ml/m LA Vol (A4C):   30.9 ml 18.76 ml/m LA Biplane Vol: 37.2 ml 22.58 ml/m  AORTIC VALVE AV Area (Vmax): 2.82 cm AV Vmax:        147.00 cm/s AV Peak Grad:   8.6 mmHg LVOT Vmax:      131.75  cm/s  AORTA Ao Root diam: 2.20 cm Ao Asc diam:  2.70 cm TRICUSPID VALVE TR Peak grad:   30.0 mmHg TR Vmax:        274.00 cm/s  SHUNTS Systemic Diam: 2.00 cm Annabella Scarce MD Electronically signed by Annabella Scarce MD Signature Date/Time: 01/04/2025/12:28:37 PM    Final    CT Angio Chest Pulmonary Embolism (PE) W or WO Contrast Result Date: 01/03/2025 EXAM: CTA of the Chest with contrast for PE 01/03/2025 08:27:07 PM TECHNIQUE: CTA of the chest was performed after the administration of 75 mL of intravenous contrast (iohexol  (OMNIPAQUE ) 350 MG/ML injection 75 mL IOHEXOL  350 MG/ML SOLN). Multiplanar reformatted images are provided for review. MIP images are provided for review. Automated exposure control, iterative reconstruction, and/or weight based adjustment of the mA/kV was utilized to reduce the radiation dose to as low as reasonably achievable. COMPARISON: 05/05/2024 CLINICAL HISTORY: Pulmonary embolism (PE) high prob; persistent hypoxia. Known emphysema, and MS. DVT evaluation. FINDINGS: PULMONARY ARTERIES: Pulmonary arteries are adequately opacified for evaluation. No pulmonary embolism. Main pulmonary artery is normal in caliber. MEDIASTINUM: Mild coronary artery calcification. Global cardiac size within normal limits. Moderate pericardial effusion, enlarged since prior examination. Mild atherosclerotic calcification within the thoracic aorta. There is no acute abnormality of  the thoracic aorta. LYMPH NODES: No mediastinal, hilar or axillary lymphadenopathy. LUNGS AND PLEURA: Moderate emphysema. Bibasilar atelectasis. Small left pleural effusion. No pneumothorax. UPPER ABDOMEN: Limited images of the upper abdomen are unremarkable. SOFT TISSUES AND BONES: No acute bone or soft tissue abnormality. IMPRESSION: 1. No pulmonary embolism. 2. Moderate pericardial effusion, enlarged since prior examination. 3. Moderate emphysema, and pulmonary emphysema is an independent risk factor for lung cancer; recommend consideration for evaluation for a low-dose CT lung cancer screening program. 4. Mild coronary artery calcification. 5. Raf score includes aortic atherosclerosis (ICD10-I70.0) and emphysema (ICD10-J43.9). Electronically signed by: Dorethia Molt MD MD 01/03/2025 08:45 PM EST RP Workstation: [id number]    Scheduled Meds:  budesonide  (PULMICORT ) nebulizer solution  0.5 mg Nebulization BID   cholecalciferol   1,000 Units Oral Daily   cyclobenzaprine   10 mg Oral QHS   diltiazem   180 mg Oral Daily   escitalopram   20 mg Oral Daily   fluticasone  furoate-vilanterol  1 puff Inhalation Daily   furosemide   40 mg Intravenous Q12H   gabapentin   100 mg Oral TID   heparin  injection (subcutaneous)  5,000 Units Subcutaneous Q8H   isosorbide  mononitrate  60 mg Oral Daily   lisinopril   40 mg Oral Daily   methylPREDNISolone  (SOLU-MEDROL ) injection  80 mg Intravenous Q12H   metoprolol  tartrate  12.5 mg Oral BID   multivitamin with minerals  1 tablet Oral Daily   rosuvastatin   40 mg Oral Daily   umeclidinium bromide   2 puff Inhalation Daily   Continuous Infusions:  potassium PHOSPHATE  IVPB (in mmol) 30 mmol (01/05/25 1039)     LOS: 3 days  MDM: Patient is high risk for one or more organ failure.  They necessitate ongoing hospitalization for continued IV therapies and subsequent lab monitoring. Total time spent interpreting labs and vitals, reviewing the medical record, coordinating care  amongst consultants and care team members, directly assessing and discussing care with the patient and/or family: 55 min  Jassiah Viviano, DO [facility]  To contact the attending physician between 7A-7P please use Epic Chat. To contact the covering physician during after hours 7P-7A, please review Amion.  01/05/2025, 3:12 PM   *This document has been created with the assistance of  dictation software. Please excuse typographical errors. *   "

## 2025-01-05 NOTE — TOC Initial Note (Signed)
 Transition of Care [facility]) - Initial/Assessment Note    Patient Details  Name: Debra Hogan MRN: [id number] Date of Birth: 17-Mar-1961  [facility]) CM/SW Contact:    Lauraine JAYSON Carpen, LCSW Phone Number: 01/05/2025, 11:32 AM  Clinical Narrative:  Readmission prevention screen complete. Unable to input data at this time. CSW met with patient. No family at bedside. CSW introduced role and explained that discharge planning would be discussed. PCP is Rollene Northern, FNP. Husband drives her to appointments. [facility]. No issues affording medications. Patient lives home with her husband and cat. No home health prior to admission. She has a cane, rollator, BSC, and shower chair at home. Patient uses 2 L of oxygen  at home through [employer] but said she was told she [place] need to increase to 3 L. Therapy is recommending SNF but patient is not interested in that. She is agreeable to home health. Will send out referral closer to discharge. No further concerns. CSW will continue to follow patient for support and facilitate return home once stable. Her husband will transport her home at discharge.                Expected Discharge Plan: Home w Home Health Services Barriers to Discharge: Continued Medical Work up   Patient Goals and CMS Choice            Expected Discharge Plan and Services     Post Acute Care Choice: Home Health Living arrangements for the past 2 months: Single Family Home                                      Prior Living Arrangements/Services Living arrangements for the past 2 months: Single Family Home Lives with:: Spouse, Pets Patient language and need for interpreter reviewed:: Yes Do you feel safe going back to the place where you live?: Yes      Need for Family Participation in Patient Care: Yes (Comment) Care giver support system in place?: Yes (comment) Current home services: DME Criminal Activity/Legal Involvement Pertinent to  Current Situation/Hospitalization: No - Comment as needed  Activities of Daily Living      Permission Sought/Granted Permission sought to share information with : Facility Industrial/product Designer granted to share information with : Yes, Verbal Permission Granted     Permission granted to share info w AGENCY: Home health agencies        Emotional Assessment Appearance:: Appears stated age Attitude/Demeanor/Rapport: Engaged, Gracious Affect (typically observed): Accepting, Appropriate, Calm, Pleasant Orientation: : Oriented to Self, Oriented to Place, Oriented to  Time, Oriented to Situation Alcohol / Substance Use: Not Applicable Psych Involvement: No (comment)  Admission diagnosis:  Multiple sclerosis [G35.D] Chronic respiratory failure with hypoxia (HCC) [J96.11] Closed displaced trimalleolar fracture of right ankle, initial encounter [D17.148J] Patient Active Problem List   Diagnosis Date Noted   Closed displaced trimalleolar fracture of right ankle, initial encounter 01/02/2025   Essential hypertension 01/02/2025   Chronic obstructive pulmonary disease (COPD) (HCC) 01/02/2025   Dyslipidemia 01/02/2025   Ganglion cyst 10/22/2024   Cyst of joint of left hand 04/29/2024   Right-sided thoracic back pain 01/03/2024   Vaginal itching 01/12/2023   Osteoarthritis of left shoulder 01/12/2022   Pain in joint, shoulder region 07/08/2021   Leg hematoma, left, sequela 02/09/2021   Renal cyst, right 06/22/2020   DDD (degenerative disc disease), lumbar 04/27/2020  Chronic low back pain with bilateral sciatica 04/27/2020   DDD (degenerative disc disease), cervical 04/27/2020   Facet arthropathy, cervical 04/27/2020   Lumbar facet joint syndrome 04/27/2020   Chronic, continuous use of opioids 04/27/2020   Chronic pain syndrome 04/27/2020   Pulmonary hypertension, unspecified (HCC) 09/29/2019   Leg swelling 08/20/2019   Insomnia 08/20/2019   Chest pain 05/21/2019    Muscle spasm 05/21/2019   Fatigue 10/21/2018   Encounter for smoking cessation counseling 10/21/2018   Anxiety and depression 10/21/2018   Left wrist pain 08/11/2018   HTN (hypertension) 08/11/2018   Atherosclerosis of aorta 05/08/2018   Elevated blood pressure reading 05/08/2018   Rash 04/19/2018   Needs flu shot 01/06/2018   Cognitive change 10/08/2017   Soft tissue mass 07/17/2017   Right arm pain 06/29/2017   Neuropathic pain 05/19/2017   Palpitations 05/07/2017   Cerebral aneurysm 05/01/2017   HLD (hyperlipidemia) 04/05/2017   Abdominal pain    Multiple sclerosis    Esophageal reflux 10/21/2015   COPD exacerbation (HCC) 07/23/2015   COPD, moderate (HCC) 02/05/2015   Generalized anxiety disorder 12/03/2014   Routine physical examination 09/19/2013   Lung nodule < 6cm on CT 05/22/2013   Tobacco use disorder 05/22/2013   Fibromyalgia 10/21/2012   Genital herpes 10/21/2012   Low back pain 10/21/2012   Subclinical hypothyroidism 10/21/2012   Myalgia and myositis 10/21/2012   PCP:  Dineen Rollene MATSU, FNP Pharmacy:   [facility] [address], [place] - [address] [place] [address] Phone: [phone] Fax: [phone]  [facility] - [place], [place] - [address] [place] [address] Phone: [phone] Fax: [phone]     Social Drivers of Health (SDOH) Social History: SDOH Screenings   Food Insecurity: No Food Insecurity (01/02/2025)  Housing: Low Risk (01/02/2025)  Transportation Needs: No Transportation Needs (01/02/2025)  Utilities: Not At Risk (01/02/2025)  Alcohol Screen: Low Risk (07/23/2024)  Depression (PHQ2-9): Low Risk (10/22/2024)  Financial Resource Strain: Medium Risk (10/22/2024)  Physical Activity: Inactive (10/22/2024)  Social Connections: Moderately Isolated (01/02/2025)  Stress: No Stress Concern Present (10/22/2024)  Tobacco Use: High Risk (01/02/2025)  Health Literacy:  Adequate Health Literacy (07/23/2024)   SDOH Interventions:     Readmission Risk Interventions     No data to display

## 2025-01-05 NOTE — Plan of Care (Signed)

## 2025-01-06 DIAGNOSIS — I3139 Other pericardial effusion (noninflammatory): Secondary | ICD-10-CM | POA: Diagnosis not present

## 2025-01-06 DIAGNOSIS — S82851A Displaced trimalleolar fracture of right lower leg, initial encounter for closed fracture: Secondary | ICD-10-CM | POA: Diagnosis not present

## 2025-01-06 LAB — TROPONIN T, HIGH SENSITIVITY
Troponin T High Sensitivity: <15 ng/L (ref 0–19)
Troponin T High Sensitivity: <15 ng/L (ref 0–19)

## 2025-01-06 MED ORDER — PREDNISONE 50 MG PO TABS
50.0000 mg | ORAL_TABLET | Freq: Every day | ORAL | Status: DC
  Administered 2025-01-08: 50 mg via ORAL
  Filled 2025-01-06: qty 1

## 2025-01-06 MED ORDER — CEFAZOLIN SODIUM-DEXTROSE 2-4 GM/100ML-% IV SOLN
2.0000 g | INTRAVENOUS | Status: AC
  Administered 2025-01-07: 2 g via INTRAVENOUS

## 2025-01-06 NOTE — Progress Notes (Signed)
 "  Progress Note  Patient Name: Debra Hogan Date of Encounter: 01/06/2025 [facility] Cardiologist: Deatrice Cage, MD   Interval Summary    Patient remains on HFNC. No chest pain reported. Plan for ankle surgery tomorrow.   Vital Signs Vitals:   01/06/25 0315 01/06/25 0356 01/06/25 0840 01/06/25 0919  BP:  (!) 140/74 129/82   Pulse:  68 74   Resp:  20    Temp:  98.2 F (36.8 C) 98.3 F (36.8 C)   TempSrc:   Oral   SpO2: 94% 96% 97% 96%  Weight:      Height:        Intake/Output Summary (Last 24 hours) at 01/06/2025 1008 Last data filed at 01/05/2025 2250 Gross per 24 hour  Intake 120 ml  Output --  Net 120 ml      01/02/2025    1:51 AM 12/10/2024    8:59 AM 11/17/2024    1:50 PM  Last 3 Weights  Weight (lbs) 130 lb 140 lb 148 lb  Weight (kg) 58.968 kg 63.504 kg 67.132 kg      Telemetry/ECG  NSR HR 70s - Personally Reviewed  Physical Exam  GEN: No acute distress.   Neck: No JVD Cardiac: RRR, no murmurs, rubs, or gallops.  Respiratory: Clear to auscultation bilaterally. GI: Soft, nontender, non-distended  MS: No edema   Relevant CV Studies:   Echo 01/04/25  1. Left ventricular ejection fraction, by estimation, is 55 to 60%. Left  ventricular ejection fraction by 3D volume is 59 %. The left ventricle has  normal function. The left ventricle has no regional wall motion  abnormalities. There is mild asymmetric  left ventricular hypertrophy of the septal segment. Indeterminate  diastolic filling due to E-A fusion. The average left ventricular global  longitudinal strain is -20.5 %. The global longitudinal strain is normal.   2. Right ventricular systolic function is normal. The right ventricular  size is normal. There is mildly elevated pulmonary artery systolic  pressure.   3. Pericardial effusion compared side by side with the echo 05/2024 and is  unchanged in size. Measures 1.2 cm. Moderate pericardial effusion. There  is no evidence of cardiac  tamponade.   4. The mitral valve is normal in structure. No evidence of mitral valve  regurgitation. No evidence of mitral stenosis.   5. The aortic valve is tricuspid. Aortic valve regurgitation is not  visualized. No aortic stenosis is present.   6. The inferior vena cava is normal in size with <50% respiratory  variability, suggesting right atrial pressure of 8 mmHg.    Echo 05/2024  1. Left ventricular ejection fraction, by estimation, is 60 to 65%. The  left ventricle has normal function. The left ventricle has no regional  wall motion abnormalities. Left ventricular diastolic parameters are  indeterminate.   2. Right ventricular systolic function is normal. The right ventricular  size is normal.   3. A small pericardial effusion is present. The pericardial effusion is  circumferential. 1 cm off the RV and LV free wall.   4. The mitral valve is normal in structure. No evidence of mitral valve  regurgitation. No evidence of mitral stenosis.   5. The aortic valve is normal in structure. Aortic valve regurgitation is  not visualized. No aortic stenosis is present.   6. The inferior vena cava is normal in size with greater than 50%  respiratory variability, suggesting right atrial pressure of 3 mmHg.    Echo 10/2023  1. Left ventricular ejection fraction, by estimation, is 65 to 70%. The  left ventricle has normal function. The left ventricle has no regional  wall motion abnormalities. Left ventricular diastolic parameters are  consistent with Grade I diastolic  dysfunction (impaired relaxation).   2. Right ventricular systolic function is normal. The right ventricular  size is normal.   3. A small pericardial effusion is present. The pericardial effusion is  circumferential. There is no evidence of cardiac tamponade.   4. The mitral valve is normal in structure. Trivial mitral valve  regurgitation.   5. The aortic valve is tricuspid. Aortic valve regurgitation is not  visualized.    6. The inferior vena cava is normal in size with <50% respiratory  variability, suggesting right atrial pressure of 8 mmHg.    MPI 05/2023 Narrative & Impression      Normal pharmacologic myocardial perfusion stress test without significant ischemia or scar.   Left ventricular systolic function is normal (LVEF > 65%).   There is no significant coronary artery calcification noted on the attenuation correction CT.  Aortic atherosclerosis is noted.   Incidental note is made of a small pericardial effusion.   This is a low risk study.      Echo 04/2020 1. Left ventricular ejection fraction, by estimation, is 60 to 65%. The  left ventricle has normal function. The left ventricle has no regional  wall motion abnormalities. Left ventricular diastolic parameters are  consistent with Grade I diastolic  dysfunction (impaired relaxation).   2. Right ventricular systolic function is normal. The right ventricular  size is normal. There is mildly elevated pulmonary artery systolic  pressure.   3. The mitral valve is normal in structure. No evidence of mitral valve  regurgitation. No evidence of mitral stenosis.   4. The aortic valve is normal in structure. Aortic valve regurgitation is  not visualized. No aortic stenosis is present.   5. The inferior vena cava is normal in size with greater than 50%  respiratory variability, suggesting right atrial pressure of 3 mmHg.   Comparison(s): EF 60-65%.      R/L heart cath 09/2019 Conclusions: No significant atherosclerotic CAD.  Vasospasm noted at the ostial RCA, which improved with IC NTG. Hyperdynamic LVEF with upper normal left heart filling pressures. Mild pulmonary hypertension. Moderately elevated right heart filling pressures. Normal Fick cardiac output/index.   Recommendations: Medical therapy; will start low-dose diltiazem  and prn sublingual NTG. Primary prevention of CAD. Continued treatment of underlying pulmonary disease.    Lonni Hanson, MD [facility] Pager: [phone]  Patient Profile: Debra Hogan is a 64 y.o. female with a hx of nonobstructive CAD, coronary vasospasm, fibromyalgia, multiple sclerosis, tobacco use, COPD on O2, degenerative disc disease, chronic back pain, HLD, pulmonary HTN who is being seen 01/05/2025 for the evaluation of pericardial effusion  Assessment & Plan   Pericardial effusion - h/o of small pericardial effusion dating back to 2024 - echo 05/2024 showed normal EF with small pericardial effusion, circumferential, 1 cm of RV and LV free wall - echo this admission showed normal EF with moderate effusion, measuring 1.2 cm, no evidence of tamponade - IV lasix  stopped - Recommend continue following with serial echocardiograms as outpatient   CAD Coronary vasospam - she denies chest pain - continue dilt 280mg  daily, Imdur  60mg  daily, Crestor  40mg  daily, Lopressor  12.5mg  BID   Acute on chronic Respiratory failure Pulmonary HTN COPD Tobacco abuse OSA - was on Bipap, now on HFNC - CTA  negative for PE - pulm consulted   Trimalleolar fracture - s/p reduction - plan to optimize respiratory status prior to anesthesia   MS Fibromyalgia Chronic pain - PTA Percocet   HLD - LDL 50 - continue Crestor  40 mg daily  Pre-operative cardiac evaluation - no further cardiac work-up recommended prior to surgery - METS<4 - RCRI = 1.1% risk of MACE    For questions or updates, please contact [facility] Please consult [email or web address] for contact info under         Signed, Mazelle Huebert VEAR Fishman, PA-C   "

## 2025-01-06 NOTE — Plan of Care (Signed)

## 2025-01-06 NOTE — Progress Notes (Signed)
 " PROGRESS NOTE    Debra Hogan  FMW:[id number] DOB: [birth year] DOA: 01/02/2025 PCP: Dineen Rollene MATSU, FNP  Chief Complaint  Patient presents with   Ankle Pain    Hospital Course:  Debra Hogan is a 64 year old female with extensive past medical history including chronic hypoxic respiratory failure on 3 L at home, COPD, MS, chronic pain with opioid dependence, hypertension, hyperlipidemia.  Patient has frequent falls secondary to her MS.  Prior to arrival she tripped and developed sudden pain in her right ankle.  On arrival to the ED imaging revealed trimalleolar fracture and associated tibiotalar dislocation/subluxation.  She was admitted and podiatry was consulted.  Initially podiatry planned for the OR on 1/9 but ultimately patient developed respiratory distress.  She underwent closed reduction instead with plan to focus on her respiratory status before attempting general anesthesia. She required BiPAP through much of 1/9.  On 1/10 she tolerated transition to heated high flow. She has gradually weaned down to [place].  We are planning for OR in the AM.   Subjective: No acute events overnight. On evaluation today patient ports that she is feeling better.  She is hopeful to make it to the OR tomorrow.   Objective: Vitals:   01/06/25 0919 01/06/25 1201 01/06/25 1448 01/06/25 1610  BP:  135/72  (!) 141/83  Pulse:    85  Resp:      Temp:  98.6 F (37 C)  98.3 F (36.8 C)  TempSrc:  Oral  Oral  SpO2: 96%  94% 94%  Weight:      Height:        Intake/Output Summary (Last 24 hours) at 01/06/2025 1645 Last data filed at 01/06/2025 0951 Gross per 24 hour  Intake 360 ml  Output --  Net 360 ml    Filed Weights   01/02/25 0151  Weight: 59 kg    Examination: General exam: Appears calm and comfortable, NAD  Respiratory system: On heated high flow, no wheeze today.  Better aeration bilaterally Cardiovascular system: S1 & S2 heard, RRR.  Gastrointestinal system: Abdomen is nondistended,  soft and nontender.  Neuro: Alert and oriented x3  Assessment & Plan:  Principal Problem:   Closed displaced trimalleolar fracture of right ankle, initial encounter Active Problems:   Anxiety and depression   Essential hypertension   Chronic obstructive pulmonary disease (COPD) (HCC)   Dyslipidemia   Pericardial effusion    Acute on chronic hypercapnic respiratory failure COPD with exacerbation - Chronically on 3 L, has required BiPAP.  ABG 1/9 with 7.4 2/79/58/51 - Send while on heated high flow.  Continue weaning today.  Finally down to 6 L nasal cannula and 94%.  This is slightly higher oxygenation than she truly needs.  Goal O2 sats 88 to 91% - CXR without evidence of new infiltrate - Continue with treatment for COPD exacerbation with steroids and nebs. - CTA negative for PE.  Does reveal pericardial effusion - Have consulted with pulmonology appreciate further recommendations - Follows outpatient pulmonology Dr. Hardy  Pericardial effusion - Better evaluated on echocardiogram 1/11 which demonstrates size is largely unchanged from prior in June 2025.  No evidence of tamponade - Initiated on Lasix  but appears to becoming dry.  Will discontinue - There was question of possible pericarditis but CRP is WNL.  She is currently on steroid therapy for COPD exacerbation as above. - Cardiology recommending follow-up echo in 1 month to reassess pericardial effusion.  Trimalleolar fracture - Status post reduction with podiatry -  Continue nonweightbearing to the right lower extremity - Ankle fracture remains unstable and displaced. - Surgery has been postponed 5 days pending medical optimization.  At this time patient's respiratory status is finally near her baseline.  She has completed a sufficient amount of time with steroids and nebulizer therapies.  She does have pericardial effusion but appears that this is stable and unchanged from June 2025.  She is due for a repeat echocardiogram  in 1 month.  She has no significant atherosclerotic CAD. - She is on daily opioid and benzodiazepine therapy chronically, and thus Liz require higher levels of sedation with anesthesia --She appears to be stable for the OR tomorrow morning.  She Slawinski require higher level of O2 after general anesthesia and thus I do recommend she stays in the progressive unit for close respiratory monitoring.  Have discussed this plan directly with podiatry  Multiple sclerosis Fibromyalgia Chronic pain Opioid dependence - Resume home meds.  Takes daily Percocet as well as 3 times daily alprazolam  - MS followed outpatient by Dr. Maree  Pulmonary arterial hypertension - Resume home meds.  Continue outpatient follow-up  Sleep apnea - Continue with CPAP/BiPAP at night is much as patient will allow  Current tobacco abuse - Has been counseled on cessation  CAD History of coronary vasospasm Congestive heart failure with preserved EF - Continue home meds - Getting diuresis as above  Dyslipidemia - Continue statin  Essential hypertension - Continue home meds, titrate as needed  Anxiety and depression - Continue home meds  DVT prophylaxis: Heparin    Code Status: Full Code Disposition: Inpatient, planning for OR in AM  Consultants:  Treatment Team:  Consulting Physician: Lennie Barter, DPM Consulting Physician: Raford Riggs, MD Consulting Physician: Darron Deatrice LABOR, MD  Procedures:    Antimicrobials:  Anti-infectives (From admission, onward)    Start     Dose/Rate Route Frequency Ordered Stop   01/02/25 0800  ceFAZolin  (ANCEF ) IVPB 2g/100 mL premix  Status:  Discontinued        2 g 200 mL/hr over 30 Minutes Intravenous On call to O.R. 01/02/25 0749 01/02/25 1502       Data Reviewed: I have personally reviewed following labs and imaging studies CBC: Recent Labs  Lab 01/02/25 0231 01/02/25 1600 01/03/25 0522 01/05/25 0513  WBC 10.1 9.4 7.7 9.7  NEUTROABS 8.6*  --   --   --    HGB 10.3* 9.8* 9.8* 9.9*  HCT 33.1* 31.5* 30.8* 29.8*  MCV 102.5* 101.9* 100.0 95.5  PLT 194 174 193 221   Basic Metabolic Panel: Recent Labs  Lab 01/02/25 0231 01/02/25 1600 01/03/25 0522 01/05/25 0513  NA 142 142 142 142  K 4.0 4.0 4.2 3.5  CL 95* 96* 97* 96*  CO2 41* 41* 38* 38*  GLUCOSE 128* 171* 133* 149*  BUN 18 18 21  25*  CREATININE 0.87 0.64 0.55 0.67  CALCIUM  9.8 9.7 9.5 9.7  MG  --   --   --  1.9  PHOS  --   --   --  1.9*   GFR: Estimated Creatinine Clearance: 63.1 mL/min (by C-G formula based on SCr of 0.67 mg/dL). Liver Function Tests: Recent Labs  Lab 01/05/25 0513  AST 27  ALT 15  ALKPHOS 65  BILITOT 0.3  PROT 6.3*  ALBUMIN 3.8   CBG: No results for input(s): GLUCAP in the last 168 hours.  No results found for this or any previous visit (from the past 240 hours).   Radiology Studies:  No results found.   Scheduled Meds:  cholecalciferol   1,000 Units Oral Daily   cyclobenzaprine   10 mg Oral QHS   diltiazem   180 mg Oral Daily   escitalopram   20 mg Oral Daily   fluticasone  furoate-vilanterol  1 puff Inhalation Daily   gabapentin   100 mg Oral TID   heparin  injection (subcutaneous)  5,000 Units Subcutaneous Q8H   isosorbide  mononitrate  60 mg Oral Daily   lisinopril   40 mg Oral Daily   multivitamin with minerals  1 tablet Oral Daily   [START ON 01/07/2025] predniSONE   50 mg Oral Q breakfast   rosuvastatin   40 mg Oral Daily   umeclidinium bromide   2 puff Inhalation Daily   Continuous Infusions:     LOS: 4 days  MDM: Patient is high risk for one or more organ failure.  They necessitate ongoing hospitalization for continued IV therapies and subsequent lab monitoring. Total time spent interpreting labs and vitals, reviewing the medical record, coordinating care amongst consultants and care team members, directly assessing and discussing care with the patient and/or family: 55 min  Aibhlinn Kalmar, DO [facility]  To contact the  attending physician between 7A-7P please use Epic Chat. To contact the covering physician during after hours 7P-7A, please review Amion.  01/06/2025, 4:45 PM   *This document has been created with the assistance of dictation software. Please excuse typographical errors. *   "

## 2025-01-06 NOTE — Progress Notes (Signed)
" ° ° °  PROCEDURAL EXPEDITER PROGRESS NOTE  Patient Name: Debra Hogan  DOB:1961/08/28 Date of Admission: 01/02/2025  Date of Assessment:01/06/2025   -------------------------------------------------------------------------------------------------------------------   Brief clinical summary: 64 yr old female with  Hx of Hypoxic respiratory failure on 3 L [place] at home , COPD, MS, chronic pain with opoid dependence, HTN and Hyperlipidemia  Orders in place: Yes   Communication with surgical team if no orders: n/a  Labs, test, and orders reviewed: Yes  Requires surgical clearance:  No  What type of clearance: n/a   Clearance received:  Cardiac saw pt while in RD  Barriers noted:n/a   Intervention provided by [facility] team: n/a  Barrier resolved:  not applicable   -------------------------------------------------------------------------------------------------------------------  [employer], Ronal DELENA Bald Please contact us  directly via secure chat (search for [facility]) or by calling us  at [phone] [facility]).  "

## 2025-01-06 NOTE — Progress Notes (Signed)
 PODIATRY / FOOT AND ANKLE SURGERY PROGRESS NOTE  Requesting Physician: Dr. Gordan  Reason for consult: Right displaced ankle fracture  Chief Complaint: Right ankle pain   HPI: Debra Hogan is a 63 y.o. female who presents today resting in bed fairly comfortably not complaining of much pain to the right ankle today.  She has dressings clean, dry, and intact.  She is on oxygen  currently and undergoing breathing treatments due to COPD exacerbation.  She has improved enough for surgery according to hospitalist.  PMHx:  Past Medical History:  Diagnosis Date   Anginal pain    Anxiety    Anxiety and depression    Arthritis    Asthma    Atherosclerosis of aorta    Cerebral aneurysm    Chronic bronchitis (HCC)    Chronic heart failure with preserved ejection fraction (HFpEF) (HCC)    a. 10/2023 Echo: EF 65-70%, GrI DD; b. 05/2024 Echo: EF 60-65%, no rwma, nl RV size/fxn, small circumferential pericardial effusion.  No significant valvular disease.   Chronic hypoxic respiratory failure, on home oxygen  therapy (HCC)    Chronic low back pain with sciatica    Chronic pain syndrome    Chronic, continuous use of opioids    COPD, very severe (HCC)    Coronary artery disease - non-obstructive    a. 09/2019 Cath: LM nl, LAD nl, LCX nl, RCA 40p (vasospasm noted ostial RCA). EF 65%-->ccb started.   Coronary vasospasm    a. 09/2019 Cath: ost RCA vasospasm-->ccb started.   DDD (degenerative disc disease), cervical    DDD (degenerative disc disease), lumbar    Depression    Dyspnea    emphasema   Fatigue    Fibromyalgia    Generalized anxiety disorder    Headache    Heat stroke 2006   Hypertension    Hypertension    Irritable bowel syndrome    MS (multiple sclerosis)    Followed by Dr. Maree   Myalgia    Myocardial infarction [facility])    Myositis    Neuropathic pain    On supplemental oxygen  therapy    on 3L [place]   Osteoarthritis of left shoulder 01/12/2022   PAH (pulmonary artery  hypertension) (HCC)    a. 09/2019 RHC: RA 12, RV 42/14, PA 38/18 (27), PCWP 14. CO 4.6 L/min.   Pericardial effusion    a. 10/2023 Echo: small circumferential pericardial effusion without tamponade; b.  05/2024 Echo: Small circumferential pericardial effusion 1 cm of the RV and LV free wall.   Right-sided thoracic back pain    Sleep apnea (untreated)    Subclinical hypothyroidism    Tobacco abuse     Surgical Hx:  Past Surgical History:  Procedure Laterality Date   ABDOMINAL HYSTERECTOMY     CERVIX intact; had for noncancerous reasons, endometriosis   ANKLE CLOSED REDUCTION Right 01/02/2025   Procedure: CLOSED REDUCTION, ANKLE;  Surgeon: Lennie Barter, DPM;  Location: [facility] ORS;  Service: Orthopedics/Podiatry;  Laterality: Right;   CATARACT EXTRACTION W/PHACO Left 08/27/2024   Procedure: PHACOEMULSIFICATION, CATARACT, WITH IOL INSERTION 2.85, 00:19.0;  Surgeon: Mittie Gaskin, MD;  Location: [facility];  Service: Ophthalmology;  Laterality: Left;   CATARACT EXTRACTION W/PHACO Right 12/10/2024   Procedure: PHACOEMULSIFICATION, CATARACT, WITH IOL INSERTION 6.93, 01:01.4;  Surgeon: Mittie Gaskin, MD;  Location: [facility];  Service: Ophthalmology;  Laterality: Right;   CHOLECYSTECTOMY N/A 01/03/2016   Procedure: LAPAROSCOPIC CHOLECYSTECTOMY WITH INTRAOPERATIVE CHOLANGIOGRAM;  Surgeon: Charlie FORBES Fell, MD;  Location:  [facility] ORS;  Service: General;  Laterality: N/A;   ENDOSCOPIC RETROGRADE CHOLANGIOPANCREATOGRAPHY (ERCP) WITH PROPOFOL  N/A 01/04/2016   Procedure: ENDOSCOPIC RETROGRADE CHOLANGIOPANCREATOGRAPHY (ERCP) WITH PROPOFOL ;  Surgeon: Rogelia Copping, MD;  Location: [facility] ENDOSCOPY;  Service: Endoscopy;  Laterality: N/A;   FOOT SURGERY  2001   LAPAROSCOPIC ENDOMETRIOSIS FULGURATION     RIGHT/LEFT HEART CATH AND CORONARY ANGIOGRAPHY Bilateral 09/29/2019   Procedure: RIGHT/LEFT HEART CATH AND CORONARY ANGIOGRAPHY;  Surgeon: Mady Bruckner, MD;  Location: [facility] INVASIVE  CV LAB;  Service: Cardiovascular;  Laterality: Bilateral;    FHx:  Family History  Problem Relation Age of Onset   Hypertension Mother    Hyperlipidemia Mother    Arthritis Mother    Cancer Father        throat   Heart disease Brother    Bipolar disorder Son    Schizophrenia Son    Cancer Maternal Grandfather        liver   Cancer Paternal Grandmother        liver    Social History:  reports that she has been smoking cigarettes. She started smoking about 53 years ago. She has a 53 pack-year smoking history. She has never used smokeless tobacco. She reports current drug use. Drugs: Marijuana and Cocaine. She reports that she does not drink alcohol.  Allergies: Allergies[1]  Medications Prior to Admission  Medication Sig Dispense Refill   acyclovir  (ZOVIRAX ) 400 MG tablet Take 1 tablet (400 mg total) by mouth daily. 90 tablet 3   Albuterol  Sulfate (PROAIR  RESPICLICK) 108 (90 Base) MCG/ACT AEPB INHALE 1 PUFF BY MOUTH EVERY 6 HOURS AS NEEDED FOR COUGH 1 each 2   ALPRAZolam  (XANAX ) 0.25 MG tablet Take 1 tablet by mouth twice daily as needed for anxiety 60 tablet 2   aspirin  EC 81 MG tablet Take 81 mg by mouth daily.     cholecalciferol  (VITAMIN D ) 1000 units tablet Take 1,000 Units by mouth daily.     cyclobenzaprine  (FLEXERIL ) 10 MG tablet Take 10 mg by mouth at bedtime.     cycloSPORINE (RESTASIS) 0.05 % ophthalmic emulsion 1 drop 2 (two) times daily.     diltiazem  (CARDIZEM  CD) 180 MG 24 hr capsule TAKE 1 CAPSULE BY MOUTH ONCE DAILY 90 capsule 3   escitalopram  (LEXAPRO ) 20 MG tablet Take 1 tablet (20 mg total) by mouth daily. 90 tablet 3   fluticasone -salmeterol (ADVAIR) 250-50 MCG/ACT AEPB Inhale 1 puff into the lungs in the morning and at bedtime. 60 each 12   gabapentin  (NEURONTIN ) 100 MG capsule Take 1 capsule (100 mg total) by mouth 3 (three) times daily. 270 capsule 3   HYDROcodone -acetaminophen  (NORCO/VICODIN) 5-325 MG tablet Take 1 tablet by mouth every 4 (four) hours as  needed.     isosorbide  mononitrate (IMDUR ) 60 MG 24 hr tablet Take 1 tablet (60 mg total) by mouth daily. 90 tablet 3   lisinopril  (ZESTRIL ) 40 MG tablet Take 1 tablet (40 mg total) by mouth daily. 90 tablet 4   meloxicam  (MOBIC ) 15 MG tablet Take 15 mg by mouth daily.     Multiple Vitamins-Minerals (CENTRUM SILVER ULTRA WOMENS) TABS Take 1 tablet by mouth daily.     nitroGLYCERIN  (NITROSTAT ) 0.4 MG SL tablet Place 1 tablet (0.4 mg total) under the tongue every 5 (five) minutes as needed for chest pain. 25 tablet 3   rosuvastatin  (CRESTOR ) 40 MG tablet Take 1 tablet (40 mg total) by mouth daily. 90 tablet 4   Tiotropium Bromide  (SPIRIVA  RESPIMAT) 2.5 MCG/ACT  AERS INHALE 2 SPRAY(S) BY MOUTH ONCE DAILY 4 g 11   ondansetron  (ZOFRAN -ODT) 4 MG disintegrating tablet Take 1 tablet (4 mg total) by mouth every 6 (six) hours as needed for nausea or vomiting. (Patient not taking: Reported on 01/06/2025) 20 tablet 2    Physical Exam: General: Alert and oriented.  No apparent distress.  Patient able to dorsiflex and plantarflex digits of right foot, capillary fill time appears to be intact to digits but no great hair growth present to digits to bilateral lower extremities.  Light touch sensation appears to be intact to the the right foot.  Posterior splint dressings to the right lower extremity appear to be intact.  Results for orders placed or performed during the hospital encounter of 01/02/25 (from the past 48 hours)  CBC     Status: Abnormal   Collection Time: 01/05/25  5:13 AM  Result Value Ref Range   WBC 9.7 4.0 - 10.5 K/uL   RBC 3.12 (L) 3.87 - 5.11 MIL/uL   Hemoglobin 9.9 (L) 12.0 - 15.0 g/dL   HCT 70.1 (L) 63.9 - 53.9 %   MCV 95.5 80.0 - 100.0 fL   MCH 31.7 26.0 - 34.0 pg   MCHC 33.2 30.0 - 36.0 g/dL   RDW 87.3 88.4 - 84.4 %   Platelets 221 150 - 400 K/uL   nRBC 0.0 0.0 - 0.2 %    Comment: Performed at [facility], [address] [place], [place] [address]  Magnesium       Status: None   Collection Time: 01/05/25  5:13 AM  Result Value Ref Range   Magnesium  1.9 1.7 - 2.4 mg/dL    Comment: Performed at [facility], [address], [place], [place] [address]  Phosphorus     Status: Abnormal   Collection Time: 01/05/25  5:13 AM  Result Value Ref Range   Phosphorus 1.9 (L) 2.5 - 4.6 mg/dL    Comment: Performed at [facility], [address] [place], [place] [address]  Comprehensive metabolic panel with GFR     Status: Abnormal   Collection Time: 01/05/25  5:13 AM  Result Value Ref Range   Sodium 142 135 - 145 mmol/L   Potassium 3.5 3.5 - 5.1 mmol/L   Chloride 96 (L) 98 - 111 mmol/L   CO2 38 (H) 22 - 32 mmol/L   Glucose, Bld 149 (H) 70 - 99 mg/dL    Comment: Glucose reference range applies only to samples taken after fasting for at least 8 hours.   BUN 25 (H) 8 - 23 mg/dL   Creatinine, Ser 9.32 0.44 - 1.00 mg/dL   Calcium  9.7 8.9 - 10.3 mg/dL   Total Protein 6.3 (L) 6.5 - 8.1 g/dL   Albumin 3.8 3.5 - 5.0 g/dL   AST 27 15 - 41 U/L   ALT 15 0 - 44 U/L   Alkaline Phosphatase 65 38 - 126 U/L   Total Bilirubin 0.3 0.0 - 1.2 mg/dL   GFR, Estimated >39 >39 mL/min    Comment: (NOTE) Calculated using the CKD-EPI Creatinine Equation (2021)    Anion gap 8 5 - 15    Comment: Performed at [facility], [address] [place], [place] [address]  Sedimentation rate     Status: Abnormal   Collection Time: 01/05/25  1:24 PM  Result Value Ref Range   Sed Rate 32 (H) 0 - 30 mm/hr    Comment: Performed at [facility], [address] [place], [place] [address]  C-reactive protein     Status: None   Collection Time: 01/05/25  1:24 PM  Result Value Ref Range   CRP <0.5 <1.0 mg/dL    Comment: Performed at [facility], [address], [place], [place] [address]  Troponin T, High Sensitivity     Status: None   Collection Time: 01/06/25  8:10 AM  Result Value Ref Range   Troponin T High Sensitivity <15 0 - 19 ng/L     Comment: (NOTE) Biotin concentrations > 1000 ng/mL falsely decrease TnT results.  Serial cardiac troponin measurements are suggested.  Refer to the Links section for chest pain algorithms and additional  guidance. Performed at [facility], [address] [place], [place] [address]   Troponin T, High Sensitivity     Status: None   Collection Time: 01/06/25 10:37 AM  Result Value Ref Range   Troponin T High Sensitivity <15 0 - 19 ng/L    Comment: (NOTE) Biotin concentrations > 1000 ng/mL falsely decrease TnT results.  Serial cardiac troponin measurements are suggested.  Refer to the Links section for chest pain algorithms and additional  guidance. Performed at [facility], [address] [place], [place] [address]    No results found.   Blood pressure (!) 141/83, pulse 85, temperature 98.3 F (36.8 C), temperature source Oral, resp. rate 20, height 5' 5 (1.651 m), weight 59 kg, SpO2 94%.   Assessment Closed displaced right trimalleolar ankle fracture, comminuted History of smoking Chronic pain syndrome Multiple sclerosis  Plan - Patient seen and examined. - Postreduction imaging reviewed.  Appears to show more anatomic position of the fibula and medial malleolus and less displaced but still slightly displaced overall. - PT/OT has been ordered for patient.  Nonweightbearing to the right lower extremity at all times.  Patient Kidney need skilled nursing rehab facility placement.  - All treatment options were discussed with the patient of both conservative and surgical attempts at correction including potential risks and complications.  Patient has elected for procedure consisting of right ankle open reduction with internal fixation.  No guarantees given.  Consent obtained.  If patient does have fracture blisters or skin appears to be compromised [place] have to delay surgery further.  Will assess this tomorrow at time of surgery.  Did not want to remove splint  today due to closed reduction which was performed. - Patient NPO at midnight for surgery tomorrow around 10 AM. - Appreciate medicine recommendations for pain management and DVT prophylaxis.  Prentice Lee, DPM 01/06/2025, 6:13 PM           [1]  Allergies Allergen Reactions   Valium [Diazepam] Anxiety

## 2025-01-06 NOTE — Progress Notes (Signed)
 Occupational Therapy Treatment Patient Details Name: Debra Hogan MRN: [id number] DOB: 17-Mar-1961 Today's Date: 01/06/2025   History of present illness 64 y/o female presented to ED on 01/02/25 following fall and landed on R ankle. Sustained closed displaced comminuted R trimalleolar ankle fx. S/p Closed reduction of R trimalleolar fracture on 1/9. Unable to complete definitive fixation 2/2 O2 saturations. PMH: MS, chronic respiratory failure on home O2 at baseline, COPD, anxiety, HTN, fibromyalgia, CAD   OT comments  Chart reviewed to date, pt greeted semi supine in bed, agreeable to OT tx session targeting improving functional activity tolerance in prep for ADL tasks. She continues to make progress towards goals as evidenced by performing seated grooming tasks with set up, transfer to bsc via squat pivot with MIN A adhering to RLE NWBing precautions. Pt on 35 L via HHFNC 35% fio2, spo2 >90% throughout, no SOB noted. HR 80s post mobility. Pt is left as received, all needs met. OT will continue to follow to facilitate optimal ADL/functional mobility engagement.       If plan is discharge home, recommend the following:  A lot of help with walking and/or transfers;A lot of help with bathing/dressing/bathroom;Help with stairs or ramp for entrance;Supervision due to cognitive status   Equipment Recommendations  Other (comment) (defer to next venue of care)    Recommendations for Other Services      Precautions / Restrictions Precautions Precautions: Fall Recall of Precautions/Restrictions: Intact Restrictions Weight Bearing Restrictions Per Provider Order: Yes RLE Weight Bearing Per Provider Order: Non weight bearing       Mobility Bed Mobility Overal bed mobility: Needs Assistance Bed Mobility: Supine to Sit, Sit to Supine     Supine to sit: Supervision Sit to supine: Supervision        Transfers                         Balance Overall balance assessment: Needs  assistance Sitting-balance support: No upper extremity supported, Feet supported                                       ADL either performed or assessed with clinical judgement   ADL Overall ADL's : Needs assistance/impaired Eating/Feeding: Set up   Grooming: Set up;Sitting                   Toilet Transfer: Minimal assistance;BSC/3in1 Toilet Transfer Details (indicate cue type and reason): squat pivot to bsc, adhering to NWBing precautions Toileting- Clothing Manipulation and Hygiene: Supervision/safety              Extremity/Trunk Sales Promotion Account Executive Communication: No apparent difficulties   Cognition Arousal: Alert Behavior During Therapy: WFL for tasks assessed/performed Cognition: No apparent impairments                               Following commands: Intact        Cueing   Cueing Techniques: Verbal cues, Tactile cues  Exercises Other Exercises Other Exercises: edu re role of OT, role of rehab, discharge recommendations    Shoulder Instructions       General Comments  Pertinent Vitals/ Pain       Pain Assessment Pain Assessment: Faces Faces Pain Scale: Hurts a little bit Pain Location: R ankle Pain Descriptors / Indicators: Discomfort, Grimacing Pain Intervention(s): Monitored during session, Repositioned  Home Living                                          Prior Functioning/Environment              Frequency  Min 2X/week        Progress Toward Goals  OT Goals(current goals can now be found in the care plan section)  Progress towards OT goals: Progressing toward goals  Acute Rehab OT Goals Time For Goal Achievement: 01/18/25  Plan      Co-evaluation                 AM-PAC OT 6 Clicks Daily Activity     Outcome Measure   Help from another person eating meals?: None Help from  another person taking care of personal grooming?: A Little Help from another person toileting, which includes using toliet, bedpan, or urinal?: A Lot Help from another person bathing (including washing, rinsing, drying)?: A Lot Help from another person to put on and taking off regular upper body clothing?: A Little Help from another person to put on and taking off regular lower body clothing?: A Lot 6 Click Score: 16    End of Session Equipment Utilized During Treatment: Oxygen   OT Visit Diagnosis: Unsteadiness on feet (R26.81);Other abnormalities of gait and mobility (R26.89);Muscle weakness (generalized) (M62.81);History of falling (Z91.81)   Activity Tolerance Patient tolerated treatment well   Patient Left in bed;with call bell/phone within reach;with bed alarm set   Nurse Communication Mobility status        Time: 1134-1150 OT Time Calculation (min): 16 min  Charges: OT General Charges $OT Visit: 1 Visit OT Treatments $Therapeutic Activity: 8-22 mins  Therisa Sheffield, OTD OTR/L  01/06/2025, 1:18 PM

## 2025-01-07 ENCOUNTER — Encounter: Admission: EM | Disposition: A | Payer: Self-pay | Source: Home / Self Care | Attending: Family Medicine

## 2025-01-07 ENCOUNTER — Telehealth: Payer: Self-pay

## 2025-01-07 ENCOUNTER — Encounter: Payer: Self-pay | Admitting: Family Medicine

## 2025-01-07 ENCOUNTER — Inpatient Hospital Stay

## 2025-01-07 DIAGNOSIS — S82851A Displaced trimalleolar fracture of right lower leg, initial encounter for closed fracture: Secondary | ICD-10-CM | POA: Diagnosis not present

## 2025-01-07 DIAGNOSIS — I3139 Other pericardial effusion (noninflammatory): Secondary | ICD-10-CM

## 2025-01-07 HISTORY — PX: ORIF ANKLE FRACTURE: SHX5408

## 2025-01-07 LAB — CBC WITH DIFFERENTIAL/PLATELET
Abs Immature Granulocytes: 0.28 K/uL — ABNORMAL HIGH (ref 0.00–0.07)
Basophils Absolute: 0 K/uL (ref 0.0–0.1)
Basophils Relative: 0 %
Eosinophils Absolute: 0 K/uL (ref 0.0–0.5)
Eosinophils Relative: 0 %
HCT: 33.1 % — ABNORMAL LOW (ref 36.0–46.0)
Hemoglobin: 10.9 g/dL — ABNORMAL LOW (ref 12.0–15.0)
Immature Granulocytes: 2 %
Lymphocytes Relative: 6 %
Lymphs Abs: 0.8 K/uL (ref 0.7–4.0)
MCH: 31.3 pg (ref 26.0–34.0)
MCHC: 32.9 g/dL (ref 30.0–36.0)
MCV: 95.1 fL (ref 80.0–100.0)
Monocytes Absolute: 0.7 K/uL (ref 0.1–1.0)
Monocytes Relative: 5 %
Neutro Abs: 11.4 K/uL — ABNORMAL HIGH (ref 1.7–7.7)
Neutrophils Relative %: 87 %
Platelets: 264 K/uL (ref 150–400)
RBC: 3.48 MIL/uL — ABNORMAL LOW (ref 3.87–5.11)
RDW: 12.6 % (ref 11.5–15.5)
WBC: 13.1 K/uL — ABNORMAL HIGH (ref 4.0–10.5)
nRBC: 0.4 % — ABNORMAL HIGH (ref 0.0–0.2)

## 2025-01-07 LAB — COMPREHENSIVE METABOLIC PANEL WITH GFR
ALT: 29 U/L (ref 0–44)
AST: 38 U/L (ref 15–41)
Albumin: 3.8 g/dL (ref 3.5–5.0)
Alkaline Phosphatase: 78 U/L (ref 38–126)
Anion gap: 12 (ref 5–15)
BUN: 51 mg/dL — ABNORMAL HIGH (ref 8–23)
CO2: 34 mmol/L — ABNORMAL HIGH (ref 22–32)
Calcium: 9.4 mg/dL (ref 8.9–10.3)
Chloride: 97 mmol/L — ABNORMAL LOW (ref 98–111)
Creatinine, Ser: 0.91 mg/dL (ref 0.44–1.00)
GFR, Estimated: >60 mL/min
Glucose, Bld: 162 mg/dL — ABNORMAL HIGH (ref 70–99)
Potassium: 3.3 mmol/L — ABNORMAL LOW (ref 3.5–5.1)
Sodium: 142 mmol/L (ref 135–145)
Total Bilirubin: 0.4 mg/dL (ref 0.0–1.2)
Total Protein: 6.3 g/dL — ABNORMAL LOW (ref 6.5–8.1)

## 2025-01-07 MED ORDER — 0.9 % SODIUM CHLORIDE (POUR BTL) OPTIME
TOPICAL | Status: DC | PRN
  Administered 2025-01-07: 1000 mL

## 2025-01-07 MED ORDER — LIDOCAINE HCL (PF) 1 % IJ SOLN
INTRAMUSCULAR | Status: AC
  Filled 2025-01-07: qty 5

## 2025-01-07 MED ORDER — BUPIVACAINE HCL (PF) 0.5 % IJ SOLN
INTRAMUSCULAR | Status: AC
  Filled 2025-01-07: qty 10

## 2025-01-07 MED ORDER — PHENYLEPHRINE 80 MCG/ML (10ML) SYRINGE FOR IV PUSH (FOR BLOOD PRESSURE SUPPORT)
PREFILLED_SYRINGE | INTRAVENOUS | Status: DC | PRN
  Administered 2025-01-07 (×3): 80 ug via INTRAVENOUS

## 2025-01-07 MED ORDER — DEXAMETHASONE SOD PHOSPHATE PF 10 MG/ML IJ SOLN
INTRAMUSCULAR | Status: AC
  Filled 2025-01-07: qty 1

## 2025-01-07 MED ORDER — MIDAZOLAM HCL (PF) 2 MG/2ML IJ SOLN
INTRAMUSCULAR | Status: DC | PRN
  Administered 2025-01-07: 2 mg via INTRAVENOUS

## 2025-01-07 MED ORDER — ONDANSETRON HCL 4 MG/2ML IJ SOLN
INTRAMUSCULAR | Status: AC
  Filled 2025-01-07: qty 2

## 2025-01-07 MED ORDER — BUPIVACAINE LIPOSOME 1.3 % IJ SUSP
INTRAMUSCULAR | Status: AC
  Filled 2025-01-07: qty 10

## 2025-01-07 MED ORDER — OXYCODONE HCL 5 MG PO TABS
5.0000 mg | ORAL_TABLET | Freq: Once | ORAL | Status: AC
  Administered 2025-01-07: 5 mg via ORAL

## 2025-01-07 MED ORDER — EPHEDRINE SULFATE-NACL 50-0.9 MG/10ML-% IV SOSY
PREFILLED_SYRINGE | INTRAVENOUS | Status: DC | PRN
  Administered 2025-01-07 (×3): 5 mg via INTRAVENOUS

## 2025-01-07 MED ORDER — MIDAZOLAM HCL 2 MG/2ML IJ SOLN
INTRAMUSCULAR | Status: AC
  Filled 2025-01-07: qty 2

## 2025-01-07 MED ORDER — LIDOCAINE HCL (PF) 1 % IJ SOLN
INTRAMUSCULAR | Status: DC | PRN
  Administered 2025-01-07: 3 mL via SUBCUTANEOUS

## 2025-01-07 MED ORDER — DROPERIDOL 2.5 MG/ML IJ SOLN: 0.6250 mg | Freq: Once | INTRAMUSCULAR | Status: DC | PRN

## 2025-01-07 MED ORDER — FENTANYL CITRATE (PF) 50 MCG/ML IJ SOSY
PREFILLED_SYRINGE | INTRAMUSCULAR | Status: AC
  Filled 2025-01-07: qty 2

## 2025-01-07 MED ORDER — LIDOCAINE HCL (PF) 2 % IJ SOLN
INTRAMUSCULAR | Status: AC
  Filled 2025-01-07: qty 5

## 2025-01-07 MED ORDER — ONDANSETRON HCL 4 MG/2ML IJ SOLN
INTRAMUSCULAR | Status: DC | PRN
  Administered 2025-01-07: 4 mg via INTRAVENOUS

## 2025-01-07 MED ORDER — CEFAZOLIN SODIUM-DEXTROSE 2-4 GM/100ML-% IV SOLN
INTRAVENOUS | Status: AC
  Filled 2025-01-07: qty 100

## 2025-01-07 MED ORDER — FENTANYL CITRATE (PF) 50 MCG/ML IJ SOSY: 50.0000 ug | PREFILLED_SYRINGE | Freq: Once | INTRAMUSCULAR | Status: DC

## 2025-01-07 MED ORDER — BUPIVACAINE LIPOSOME 1.3 % IJ SUSP
INTRAMUSCULAR | Status: DC | PRN
  Administered 2025-01-07: 20 mL via INTRAMUSCULAR

## 2025-01-07 MED ORDER — DEXAMETHASONE SOD PHOSPHATE PF 10 MG/ML IJ SOLN
INTRAMUSCULAR | Status: DC | PRN
  Administered 2025-01-07: 4 mg via INTRAVENOUS

## 2025-01-07 MED ORDER — ENOXAPARIN SODIUM 40 MG/0.4ML IJ SOSY
40.0000 mg | PREFILLED_SYRINGE | INTRAMUSCULAR | Status: DC
  Administered 2025-01-08 – 2025-01-09 (×2): 40 mg via SUBCUTANEOUS
  Filled 2025-01-07 (×2): qty 0.4

## 2025-01-07 MED ORDER — ENOXAPARIN SODIUM 40 MG/0.4ML IJ SOSY: 40.0000 mg | PREFILLED_SYRINGE | INTRAMUSCULAR | Status: DC

## 2025-01-07 MED ORDER — OXYCODONE-ACETAMINOPHEN 5-325 MG PO TABS
1.0000 | ORAL_TABLET | Freq: Four times a day (QID) | ORAL | Status: DC | PRN
  Administered 2025-01-07 – 2025-01-09 (×6): 1 via ORAL
  Filled 2025-01-07 (×6): qty 1

## 2025-01-07 MED ORDER — FENTANYL CITRATE (PF) 100 MCG/2ML IJ SOLN: 25.0000 ug | INTRAMUSCULAR | Status: DC | PRN

## 2025-01-07 MED ORDER — FENTANYL CITRATE (PF) 50 MCG/ML IJ SOSY
50.0000 ug | PREFILLED_SYRINGE | INTRAMUSCULAR | Status: DC | PRN
  Administered 2025-01-07: 50 ug via INTRAVENOUS

## 2025-01-07 MED ORDER — OXYCODONE HCL 5 MG PO TABS
ORAL_TABLET | ORAL | Status: AC
  Filled 2025-01-07: qty 1

## 2025-01-07 MED ORDER — PROPOFOL 10 MG/ML IV BOLUS
INTRAVENOUS | Status: AC
  Filled 2025-01-07: qty 20

## 2025-01-07 MED ORDER — BUPIVACAINE LIPOSOME 1.3 % IJ SUSP
INTRAMUSCULAR | Status: DC | PRN
  Administered 2025-01-07: 10 mL via PERINEURAL

## 2025-01-07 MED ORDER — FENTANYL CITRATE (PF) 100 MCG/2ML IJ SOLN
INTRAMUSCULAR | Status: AC
  Filled 2025-01-07: qty 2

## 2025-01-07 MED ORDER — BUPIVACAINE HCL (PF) 0.25 % IJ SOLN
INTRAMUSCULAR | Status: AC
  Filled 2025-01-07: qty 30

## 2025-01-07 MED ORDER — PROPOFOL 10 MG/ML IV BOLUS
INTRAVENOUS | Status: DC | PRN
  Administered 2025-01-07: 100 mg via INTRAVENOUS

## 2025-01-07 MED ORDER — LACTATED RINGERS IV SOLN: INTRAVENOUS | Status: DC | PRN

## 2025-01-07 MED ORDER — BUPIVACAINE HCL (PF) 0.5 % IJ SOLN
INTRAMUSCULAR | Status: DC | PRN
  Administered 2025-01-07: 10 mL via PERINEURAL

## 2025-01-07 MED ORDER — FENTANYL CITRATE (PF) 100 MCG/2ML IJ SOLN
INTRAMUSCULAR | Status: DC | PRN
  Administered 2025-01-07 (×2): 50 ug via INTRAVENOUS

## 2025-01-07 NOTE — TOC Progression Note (Signed)
 Transition of Care [facility]) - Progression Note    Patient Details  Name: Debra Hogan MRN: [id number] Date of Birth: November 23, 1961  Transition of [facility]) CM/SW Contact  Lauraine JAYSON Carpen, LCSW Phone Number: 01/07/2025, 9:32 AM  Clinical Narrative:   Oxygen  requirements improved. Sent out home health referral for PT, OT, RN, aide.  Expected Discharge Plan: Home w Home Health Services Barriers to Discharge: Continued Medical Work up               Expected Discharge Plan and Services     Post Acute Care Choice: Home Health Living arrangements for the past 2 months: Single Family Home                                       Social Drivers of Health (SDOH) Interventions SDOH Screenings   Food Insecurity: No Food Insecurity (01/02/2025)  Housing: Low Risk (01/02/2025)  Transportation Needs: No Transportation Needs (01/02/2025)  Utilities: Not At Risk (01/02/2025)  Alcohol Screen: Low Risk (07/23/2024)  Depression (PHQ2-9): Low Risk (10/22/2024)  Financial Resource Strain: Medium Risk (10/22/2024)  Physical Activity: Inactive (10/22/2024)  Social Connections: Moderately Isolated (01/02/2025)  Stress: No Stress Concern Present (10/22/2024)  Tobacco Use: High Risk (01/02/2025)  Health Literacy: Adequate Health Literacy (07/23/2024)    Readmission Risk Interventions     No data to display
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
## 2025-01-07 NOTE — Transfer of Care (Signed)
 Immediate Anesthesia Transfer of Care Note  Patient: Debra Hogan  Procedure(s) Performed: OPEN REDUCTION INTERNAL FIXATION (ORIF) ANKLE FRACTURE (Right: Ankle)  Patient Location: PACU  Anesthesia Type:General  Level of Consciousness: awake, alert , oriented, and patient cooperative  Airway & Oxygen  Therapy: Patient Spontanous Breathing and Patient connected to face mask oxygen   Post-op Assessment: Report given to RN, Post -op Vital signs reviewed and stable, and Patient moving all extremities  Post vital signs: Reviewed and stable  Last Vitals:  Vitals Value Taken Time  BP 129/73 01/07/25 13:03  Temp    Pulse 77 01/07/25 13:05  Resp 16 01/07/25 13:05  SpO2 94 % 01/07/25 13:05  Vitals shown include unfiled device data.  Last Pain:  Vitals:   01/07/25 0939  TempSrc: Temporal  PainSc: 6       Patients Stated Pain Goal: 0 (01/06/25 2105)  Complications: No notable events documented.

## 2025-01-07 NOTE — Op Note (Signed)
 PODIATRY / FOOT AND ANKLE SURGERY OPERATIVE REPORT    SURGEON: Prentice Lee, DPM  PRE-OPERATIVE DIAGNOSIS:  1.  Right closed displaced trimalleolar ankle fracture  POST-OPERATIVE DIAGNOSIS: Same  PROCEDURE(S): Right trimalleolar ankle fracture open reduction with internal fixation Use of intraoperative fluoroscopy  HEMOSTASIS: Right thigh tourniquet  ANESTHESIA: general  ESTIMATED BLOOD LOSS: 20 cc  FINDING(S): 1.  Comminuted fibular fracture 2.  Anterior lateral tibia with fracture fragment present within the ankle joint 3.  Medial malleolar fracture 4.  Posterior malleolar fracture  PATHOLOGY/SPECIMEN(S): None  INDICATIONS:   Debra Hogan is a 64 y.o. female who presents with a displaced right trimalleolar ankle fracture after sustaining a fall at home.  Patient went to the emergency room after that due to not being able to put weight on the foot.  Patient had closed reduction performed in the emergency room but was unsuccessful.  Due to patient's COPD issues she was not able to be taken to the operative setting right away to fix the area.  She was brought back to the operating room to perform close reduction.  The ankle appeared to be more stable and taking pressure off the medial malleolus and medial ankle area in which she was getting some soft tissue damage.  The patient was placed into a splint and was then optimized by medicine and pulmonology for surgery in which she presents today.  All treatment options were discussed with the patient both conservative and surgical attempts at correction include potential risks and complications, patient was agreeable to procedure today consisting of right trimalleolar ankle fracture open reduction with internal fixation.  No guarantees given.  Consent obtained prior to procedure..  DESCRIPTION: After obtaining full informed written consent, the patient was brought back to the operating room and placed supine upon the operating table.  The  patient received IV antibiotics prior to induction.  After obtaining adequate anesthesia, the patient was prepped and draped in the standard fashion.  Preoperatively a popliteal nerve block was performed by anesthesia.  An Esmarch bandage was used to exsanguinate the right lower extremity and pneumatic thigh tourniquet was inflated.  Attention was directed to the right lateral ankle where an incision was made along the midline of the fibula parallel to the long axis of the fibula.  This was made from the distal tip of the lateral malleolus extending to the distal shaft area.  The incision was deepened through the subcutaneous tissues utilizing sharp and blunt dissection care was taken to identify and retract all vital neurovascular structures all venous contributories were cauterized necessary.  The superficial peroneal nerve was identified and retracted anteriorly throughout the remainder of the case.  At this time a periosteal incision was then made into the distal fibula.  The periosteum was reflected anteriorly and posteriorly thereby exposing the fracture at the operative site.  The distal fibular fracture appeared to be comminuted and in 3 main pieces.  There also appeared to be a large piece present at the anterior tibia that was visualized through the area.  This appeared to be free-floating in the ankle joint which was removed and passed off the operative site.  The fracture was then mobilized and held with temporary fixation with a K wire and then held with reduction clamps as well.  The medial ankle still appeared to be very unstable making it difficult to control the reduction of the fibula so at this time direction was then directed to the medial ankle.  And the incision  was made running along the same direction as the great saphenous vein over the medial malleolus and distal tibia.  The incision was deepened through the subcutaneous tissues utilizing sharp and blunt dissection care was taken to  identify and retract all vital neurovascular structures no venous contributories or cauterized necessary.  The periosteum off of the medial malleolus and distal tibia was reflected thereby exposing the fracture at the operative site.  The fracture was easily mobilized and able to fit fairly anatomic using the contours of the fracture fragment to guide the reduction.  This was held in place with a temporary fixation reduction clamp.  At this time 2 parallel K wires were then placed through the medial malleolus across the fracture site and into the tibia with the appropriate orientation.  Utilizing standard AO principles and techniques to 4.0 x 40 mm Paragon 28 partially-threaded cannulated screws were placed with excellent compression noted.  The temporary wires as well as reduction clamp were removed.  Overall reduction appeared to be excellent utilizing fluoroscopic guidance and the ankle medially appeared to be well reduced.  Laterally the distal fibula still appeared to be slightly laterally displaced.  Attention was then brought back to the lateral malleolus and lateral ankle area.  The temporary wire was removed and reduction was then performed once again.  This appeared to be much more easy to manipulate now that the medial ankle was in a better position.  The fibula was then placed into near anatomic alignment and brought out to length and also rotated in pushed medially.  This was held with temporary reduction clamps.  The comminuted fracture pieces then appeared to fit fairly well completing the puzzle of the distal fibula.  A small amount of DBM was also placed in this bone void along with the comminuted pieces.  There was not an ability to put a lag screw across this area due to the amount of comminution.  At this time an 11 hole Paragon 28 distal fibular locking plate was then placed and held with temporary fixation all of wires.  Fluoroscopic guidance was used to check reduction as well as plate  placement which appeared to be excellent overall.  The ankle appeared to be in alignment and near anatomic position.  Both the AP and lateral as well as mortise views were used and the posterior malleolar fracture fragment appeared to be well reduced as well.  At this time utilizing standard AO principles and techniques the 11 hole Paragon 28 fibular locking plate was used in the appropriate screws were placed leaving 2 slots open around where the fracture area was leaving 4 screws distal and 5 screws proximal to the fracture site, all screws were locking screws and were 2.7 mm.  Sizes of screws were checked under fluoroscopic guidance and appeared to be the appropriate placement.  Stressing was then performed of the ankle in multiple views and a cotton hook test was then performed and there did not appear to be any instability to the syndesmosis.  The ankle appeared to sit in near anatomic alignment overall with plate and screw fixation intact in the appropriate length and size.  The surgical sites were flushed with copious amounts normal sterile saline.  The periosteal and capsular structures then reapproximated well coapted with 3-0 Vicryl as well as the subcutaneous tissue.  The skin was then reapproximated well coapted with skin staples.  An additional 20 cc of one-to-one mix of quarter percent Marcaine  and Exparel  was injected about the  incision site/operative areas.  A postoperative dressing was then applied consisting of Xeroform followed by 4 x 4 gauze, ABD pad, Kerlix, Webril, posterior splint and sugar-tong splint with Ace wrap.  The pneumatic thigh tourniquet was deflated and a prompt hyperemic response was noted all digits of right foot.  The patient tolerated the procedure and anesthesia well and was transferred to the recovery in with vital signs stable and vascular status intact all toes of the right foot.  Following a period of postoperative monitoring the patient will be discharged back to the  inpatient room with the appropriate orders, instructions, medications.  So long as patient's pain is well-managed consider signing off tomorrow.  Would like patient has DVT prophylaxis to take aspirin  81 mg twice daily.  Would also recommend that she takes Augmentin  twice daily for 1 week after procedure.  COMPLICATIONS: None  CONDITION: Good, stable  Prentice Lee, DPM

## 2025-01-07 NOTE — Plan of Care (Signed)

## 2025-01-07 NOTE — Telephone Encounter (Signed)
-----   Message from Cadence VEAR Fishman sent at 01/07/2025 11:15 AM EST ----- Regarding: hosp follow-up Patient needs limited echo in 1 month with follow-up after the echo

## 2025-01-07 NOTE — Plan of Care (Signed)

## 2025-01-07 NOTE — Discharge Instructions (Signed)
 [facility]  POST OPERATIVE INSTRUCTIONS FOR DR. ASHLEY AND DR. Jaciel Diem [facility] CLINIC PODIATRY DEPARTMENT   Take your medication as prescribed.  Pain medication should be taken only as needed.  Keep the dressing clean, dry and intact.  Keep your foot elevated above the heart level for the first 48 hours.  Walking to the bathroom and brief periods of walking are acceptable, unless we have instructed you to be non-weight bearing.  Always wear your post-op shoe when walking.  Always use your crutches if you are to be non-weight bearing.  Do not take a shower. Baths are permissible as long as the foot is kept out of the water.   Every hour you are awake:  Bend your knee 15 times. Massage calf 15 times  Call [facility] [phone]) if any of the following problems occur: You develop a temperature or fever. The bandage becomes saturated with blood. Medication does not stop your pain. Injury of the foot occurs. Any symptoms of infection including redness, odor, or red streaks running from wound.

## 2025-01-07 NOTE — Anesthesia Procedure Notes (Signed)
 Anesthesia Regional Block: Popliteal block   Pre-Anesthetic Checklist: , timeout performed,  Correct Patient, Correct Site, Correct Laterality,  Correct Procedure, Correct Position, site marked,  Risks and benefits discussed,  Surgical consent,  Pre-op evaluation,  At surgeon's request and post-op pain management  Laterality: Lower and Right  Prep: chloraprep       Needles:  Injection technique: Single-shot  Needle Type: Echogenic Needle     Needle Length: 9cm  Needle Gauge: 21     Additional Needles:   Procedures:,,,, ultrasound used (permanent image in chart),,    Narrative:  Start time: 01/07/2025 10:23 AM End time: 01/07/2025 10:25 AM Injection made incrementally with aspirations every 5 mL.  Performed by: Personally  Anesthesiologist: Dario Barter, MD  Additional Notes: Patient consented for risk and benefits of nerve block including but not limited to nerve damage, failed block, bleeding and infection.  Patient voiced understanding.  Functioning IV was confirmed and monitors were applied.  Timeout done prior to procedure and prior to any sedation being given to the patient.  Patient confirmed procedure site prior to any sedation given to the patient.  A 50mm 22ga Stimuplex needle was used. Sterile prep,hand hygiene and sterile gloves were used.  Minimal sedation used for procedure.  No paresthesia endorsed by patient during the procedure.  Negative aspiration and negative test dose prior to incremental administration of local anesthetic. The patient tolerated the procedure well with no immediate complications.

## 2025-01-07 NOTE — H&P (Signed)
 HISTORY AND PHYSICAL INTERVAL NOTE:  01/07/2025  10:02 AM  Debra Hogan  has presented today for surgery, with the diagnosis of Right ankle fracture.  The various methods of treatment have been discussed with the patient.  No guarantees were given.  After consideration of risks, benefits and other options for treatment, the patient has consented to surgery.  I have reviewed the patients chart and labs.    PROCEDURE: RIGHT TRIMAL ANKLE FRACTURE ORIF  A history and physical examination was performed in the hospital.  The patient was reexamined.  There have been no changes to this history and physical examination.  Prentice Lee, DPM

## 2025-01-07 NOTE — Progress Notes (Signed)
 " PROGRESS NOTE    Debra Hogan  FMW:[id number] DOB: Apr 14, 1961 DOA: 01/02/2025 PCP: Dineen Rollene MATSU, FNP  253A/253A-AA  LOS: 5 days   Brief hospital course:   Assessment & Plan: Debra Hogan is a 64 year old female with extensive past medical history including chronic hypoxic respiratory failure on 3 L at home, COPD, MS, chronic pain with opioid dependence, hypertension, hyperlipidemia.  Patient has frequent falls secondary to her MS.  Prior to arrival she tripped and developed sudden pain in her right ankle.  On arrival to the ED imaging revealed trimalleolar fracture and associated tibiotalar dislocation/subluxation.  She was admitted and podiatry was consulted.  Initially podiatry planned for the OR on 1/9 but ultimately patient developed respiratory distress.  She underwent closed reduction instead with plan to focus on her respiratory status before attempting general anesthesia. She required BiPAP through much of 1/9.  On 1/10 she tolerated transition to heated high flow. She has gradually weaned down to [place].    Trimalleolar fracture, right --ORIF of right ankle fracture today.  --non-weight-bearing to RLE  Acute on chronic hypercapnic respiratory failure COPD with exacerbation - Chronically on 3 L, has required BiPAP.  ABG 1/9 with 7.4 2/79/58/51 - CXR without evidence of new infiltrate - CTA negative for PE.  Does reveal pericardial effusion - Have consulted with pulmonology appreciate further recommendations --started tx for COPD exacerbation --currently on 4L [place] --cont prednisone  50 mg daily --cont bronchodilators   Chronic Pericardial effusion - Better evaluated on echocardiogram 1/11 which demonstrates size is largely unchanged from prior in June 2025.  No evidence of tamponade - There was question of possible pericarditis but CRP is WNL.  She is currently on steroid therapy for COPD exacerbation as above. - Cardiology recommending follow-up echo in 1 month to reassess  pericardial effusion.   Multiple sclerosis Fibromyalgia Chronic pain Opioid dependence - Takes daily Percocet  - MS followed outpatient by Dr. Maree   Pulmonary arterial hypertension - Resume home meds.  Continue outpatient follow-up   Sleep apnea   Current tobacco abuse - Has been counseled on cessation   CAD History of coronary vasospasm Congestive heart failure with preserved EF   Dyslipidemia - Continue statin   Essential hypertension --cont cardizem , Imdur  and lisinopril    Anxiety and depression --cont Lexapro  --cont Xanax  PRN  Hypokalemia --monitor and supplement PRN   DVT prophylaxis: Lovenox  SQ Code Status: Full code  Family Communication:  Level of care: Med-Surg Dispo:   The patient is from: home Anticipated d/c is to: home Anticipated d/c date is: 2-3 days   Subjective and Interval History:  Pt underwent ORIF of right ankle fracture today.  Post-op, pt reported feeling beat.  Breathing improved though.   Objective: Vitals:   01/07/25 1345 01/07/25 1618 01/07/25 2001 01/07/25 2037  BP: 113/68 124/62 122/71 112/73  Pulse: 72 86 (!) 102 98  Resp: 16 19 19 20   Temp: (!) 97.5 F (36.4 C) 97.8 F (36.6 C) 98.3 F (36.8 C) 98.4 F (36.9 C)  TempSrc:   Oral   SpO2: 93% 92% 93% 91%  Weight:      Height:        Intake/Output Summary (Last 24 hours) at 01/07/2025 2158 Last data filed at 01/07/2025 1927 Gross per 24 hour  Intake 1040 ml  Output 5 ml  Net 1035 ml   Filed Weights   01/02/25 0151  Weight: 59 kg    Examination:   Constitutional: NAD, AAOx3 HEENT: conjunctivae  and lids normal, EOMI CV: No cyanosis.   RESP: normal respiratory effort, on 4L Neuro: II - XII grossly intact.   Psych: Normal mood and affect.  Appropriate judgement and reason   Data Reviewed: I have personally reviewed labs and imaging studies  Time spent: 50 minutes  Ellouise Haber, MD [facility] If 7PM-7AM, please contact night-coverage 01/07/2025,  9:58 PM   "

## 2025-01-07 NOTE — Anesthesia Postprocedure Evaluation (Signed)
"   Anesthesia Post Note  Patient: Nadean Montanaro Soltau  Procedure(s) Performed: OPEN REDUCTION INTERNAL FIXATION (ORIF) ANKLE FRACTURE (Right: Ankle)  Patient location during evaluation: PACU Anesthesia Type: General Level of consciousness: awake and alert Pain management: pain level controlled Vital Signs Assessment: post-procedure vital signs reviewed and stable Respiratory status: spontaneous breathing, nonlabored ventilation, respiratory function stable and patient connected to nasal cannula oxygen  Cardiovascular status: blood pressure returned to baseline and stable Postop Assessment: no apparent nausea or vomiting Anesthetic complications: no   No notable events documented.   Last Vitals:  Vitals:   01/07/25 1330 01/07/25 1345  BP: 120/65 113/68  Pulse: 75 72  Resp: 17 16  Temp:  (!) 36.4 C  SpO2: 93% 93%    Last Pain:  Vitals:   01/07/25 1326  TempSrc:   PainSc: 5                  Camellia Merilee Louder      "

## 2025-01-07 NOTE — Progress Notes (Signed)
 PT Progress Note  Patient Details Name: Debra Hogan MRN: [id number] DOB: [birth year]   Cancelled Treatment:    Reason Eval/Treat Not Completed: Patient not medically ready (Pt OTF for ORIF c DPM. Will await new order for evaluation in postop phase and plan on resuming services on POD per protocol.)  10:21 AM, 01/07/2025 Peggye JAYSON Linear, PT, DPT Physical Therapist - [facility]  [phone] (ASCOM)    Trooper Olander C 01/07/2025, 10:21 AM

## 2025-01-07 NOTE — Anesthesia Procedure Notes (Signed)
 Procedure Name: LMA Insertion Date/Time: 01/07/2025 10:40 AM  Performed by: Myra Lawless, CRNAPre-anesthesia Checklist: Patient identified, Patient being monitored, Timeout performed, Emergency Drugs available and Suction available Patient Re-evaluated:Patient Re-evaluated prior to induction Oxygen  Delivery Method: Circle system utilized Preoxygenation: Pre-oxygenation with 100% oxygen  Induction Type: IV induction Ventilation: Mask ventilation without difficulty LMA: LMA inserted LMA Size: 4.0 Tube type: Oral Number of attempts: 1 Placement Confirmation: positive ETCO2 and breath sounds checked- equal and bilateral Tube secured with: Tape Dental Injury: Teeth and Oropharynx as per pre-operative assessment

## 2025-01-08 ENCOUNTER — Encounter: Payer: Self-pay | Admitting: Podiatry

## 2025-01-08 DIAGNOSIS — S82851A Displaced trimalleolar fracture of right lower leg, initial encounter for closed fracture: Secondary | ICD-10-CM | POA: Diagnosis not present

## 2025-01-08 LAB — BASIC METABOLIC PANEL WITH GFR
Anion gap: 9 (ref 5–15)
BUN: 54 mg/dL — ABNORMAL HIGH (ref 8–23)
CO2: 34 mmol/L — ABNORMAL HIGH (ref 22–32)
Calcium: 9 mg/dL (ref 8.9–10.3)
Chloride: 95 mmol/L — ABNORMAL LOW (ref 98–111)
Creatinine, Ser: 1.11 mg/dL — ABNORMAL HIGH (ref 0.44–1.00)
GFR, Estimated: 55 mL/min — ABNORMAL LOW
Glucose, Bld: 153 mg/dL — ABNORMAL HIGH (ref 70–99)
Potassium: 4 mmol/L (ref 3.5–5.1)
Sodium: 138 mmol/L (ref 135–145)

## 2025-01-08 LAB — MAGNESIUM: Magnesium: 1.9 mg/dL (ref 1.7–2.4)

## 2025-01-08 LAB — PHOSPHORUS: Phosphorus: 3.6 mg/dL (ref 2.5–4.6)

## 2025-01-08 MED ORDER — POLYETHYLENE GLYCOL 3350 17 G PO PACK
34.0000 g | PACK | Freq: Two times a day (BID) | ORAL | Status: DC
  Administered 2025-01-08 – 2025-01-09 (×3): 34 g via ORAL
  Filled 2025-01-08 (×3): qty 2

## 2025-01-08 MED ORDER — AMOXICILLIN-POT CLAVULANATE 875-125 MG PO TABS
1.0000 | ORAL_TABLET | Freq: Two times a day (BID) | ORAL | Status: DC
  Administered 2025-01-08 – 2025-01-09 (×2): 1 via ORAL
  Filled 2025-01-08 (×2): qty 1

## 2025-01-08 MED ORDER — ASPIRIN 81 MG PO TBEC
81.0000 mg | DELAYED_RELEASE_TABLET | Freq: Two times a day (BID) | ORAL | Status: DC
  Administered 2025-01-08 – 2025-01-09 (×2): 81 mg via ORAL
  Filled 2025-01-08 (×2): qty 1

## 2025-01-08 MED ORDER — SODIUM CHLORIDE 0.9 % IV SOLN: INTRAVENOUS | Status: AC

## 2025-01-08 NOTE — Plan of Care (Signed)
   Problem: Education: Goal: Knowledge of General Education information will improve Description Including pain rating scale, medication(s)/side effects and non-pharmacologic comfort measures Outcome: Progressing

## 2025-01-08 NOTE — Progress Notes (Signed)
 " PROGRESS NOTE    Debra Hogan  FMW:[id number] DOB: [birth year] DOA: 01/02/2025 PCP: Dineen Rollene MATSU, FNP  144A/144A-AA  LOS: 6 days   Brief hospital course:   Assessment & Plan: Debra Hogan is a 64 year old female with extensive past medical history including chronic hypoxic respiratory failure on 3 L at home, COPD, MS, chronic pain with opioid dependence, hypertension, hyperlipidemia.  Patient has frequent falls secondary to her MS.  Prior to arrival she tripped and developed sudden pain in her right ankle.  On arrival to the ED imaging revealed trimalleolar fracture and associated tibiotalar dislocation/subluxation.  She was admitted and podiatry was consulted.  Initially podiatry planned for the OR on 1/9 but ultimately patient developed respiratory distress.  She underwent closed reduction instead with plan to focus on her respiratory status before attempting general anesthesia. She required BiPAP through much of 1/9.  On 1/10 she tolerated transition to heated high flow. She has gradually weaned down to [place].    Trimalleolar fracture, right --ORIF of right ankle fracture on 1/14.  --non-weight-bearing to RLE in posterior splint --DVT ppx: ASA 81 BID --start ppx Augmentin  for 7 days --Follow up with podiatry in 1 week.   Acute on chronic hypercapnic respiratory failure COPD with exacerbation On 3L O2 at baseline - Chronically on 3 L, has required BiPAP.  ABG 1/9 with 7.4 2/79/58/51 - CXR without evidence of new infiltrate - CTA negative for PE.  Does reveal pericardial effusion - Have consulted with pulmonology appreciate further recommendations --started tx for COPD exacerbation.  Completed steroid burst with 5 days of IV solumedrol --cont bronchodilators --Continue supplemental O2 to keep sats >=90%, wean as tolerated  Chronic Pericardial effusion - Better evaluated on echocardiogram 1/11 which demonstrates size is largely unchanged from prior in June 2025.  No evidence of  tamponade - There was question of possible pericarditis but CRP is WNL.  She is currently on steroid therapy for COPD exacerbation as above. - Cardiology recommending follow-up echo in 1 month to reassess pericardial effusion.   Multiple sclerosis Fibromyalgia Chronic pain Opioid dependence - Takes daily Percocet  - MS followed outpatient by Dr. Maree   Sleep apnea   Current tobacco abuse - Has been counseled on cessation   CAD --cont asa and statin  History of coronary vasospasm Congestive heart failure with preserved EF   Dyslipidemia - Continue statin   Essential hypertension --cont cardizem , Imdur  and lisinopril    Anxiety and depression --cont Lexapro  --cont Xanax  PRN  Hypokalemia --monitor and supplement PRN  Cr increase --not quite meet criteria for AKI.  Likely due to dehydration from reduced oral hydration. --NS@100  for 10 hours   DVT prophylaxis: Lovenox  SQ Code Status: Full code  Family Communication:  Level of care: Med-Surg Dispo:   The patient is from: home Anticipated d/c is to: home Anticipated d/c date is: tomorrow   Subjective and Interval History:  Respiratory status table.  Pt felt ready to go home.    Podiatry saw pt today, cleared pt for discharge.   Objective: Vitals:   01/08/25 0856 01/08/25 1517 01/08/25 1543 01/08/25 2113  BP: 130/77 131/68 129/65 137/76  Pulse: 95 (!) 103 (!) 105 85  Resp: 16 16 20 18   Temp:  98.9 F (37.2 C) 98.7 F (37.1 C) 97.7 F (36.5 C)  TempSrc:      SpO2: 94% 91% 90% 93%  Weight:      Height:        Intake/Output Summary (  Last 24 hours) at 01/08/2025 2247 Last data filed at 01/08/2025 2000 Gross per 24 hour  Intake 444.37 ml  Output 600 ml  Net -155.63 ml   Filed Weights   01/02/25 0151  Weight: 59 kg    Examination:   Constitutional: NAD, AAOx3 HEENT: conjunctivae and lids normal, EOMI CV: No cyanosis.   RESP: normal respiratory effort Neuro: II - XII grossly intact.   Psych:  Normal mood and affect.  Appropriate judgement and reason   Data Reviewed: I have personally reviewed labs and imaging studies  Time spent: 50 minutes  Ellouise Haber, MD [facility] If 7PM-7AM, please contact night-coverage 01/08/2025, 10:47 PM   "

## 2025-01-08 NOTE — Progress Notes (Signed)
 Physical Therapy Treatment Patient Details Name: Debra Hogan MRN: [id number] DOB: 1961/10/01 Today's Date: 01/08/2025   History of Present Illness 64 y/o female presented to ED on 01/02/25 following fall and landed on R ankle. Sustained closed displaced comminuted R trimalleolar ankle fx. S/p Closed reduction of R trimalleolar fracture on 1/9. Now s/p ORIF (01/08/24), NWB R LE. PMH: MS, chronic respiratory failure on home O2 at baseline, COPD, anxiety, HTN, fibromyalgia, CAD    PT Comments  Patient seen for follow up treatment session s/p R ankle ORIF (01/08/24), NWB; cleared for continuation of therapy per medical team.  Patient eager to participate/progress; fair/good ability to maintain NWB with simple mobility (sit/stand, static standing and squat pivot transfers).  Does fatigue quickly with gait/progressive mobility; anticipate benefit of manual WC for distance.  Did endorse moderate dizziness with transition to upright; will assess orthostatics next session if symptoms persist.  Goals and plan of care established at initial eval remain appropriate for patient.  Discharge recs updated to reflect progress since initial eval.     If plan is discharge home, recommend the following: Assistance with cooking/housework;Assist for transportation;Help with stairs or ramp for entrance;A little help with walking and/or transfers;A little help with bathing/dressing/bathroom   Can travel by private vehicle        Equipment Recommendations  Rolling walker (2 wheels)    Recommendations for Other Services       Precautions / Restrictions Precautions Precautions: Fall Recall of Precautions/Restrictions: Intact Restrictions Weight Bearing Restrictions Per Provider Order: Yes RLE Weight Bearing Per Provider Order: Non weight bearing     Mobility  Bed Mobility Overal bed mobility: Modified Independent Bed Mobility: Supine to Sit, Sit to Supine     Supine to sit: Modified independent  (Device/Increase time) Sit to supine: Modified independent (Device/Increase time)        Transfers Overall transfer level: Needs assistance Equipment used: Rolling walker (2 wheels) Transfers: Sit to/from Stand, Bed to chair/wheelchair/BSC Sit to Stand: Min assist     Squat pivot transfers: Contact guard assist     General transfer comment: min cuing for hand placement with sit/stand from bed surface, good ability to maintain NWB R LE with transfer and static standing; squat pivot, bed/chair bilat, cga, good hand placement, movement control and NWB R LE    Ambulation/Gait Ambulation/Gait assistance: Min assist Gait Distance (Feet): 2 Feet (2' forward/backward) Assistive device: Rolling walker (2 wheels)         General Gait Details: hops (x2) forward/backward, min assist, for dynamic balance; fair/good ability to maintain NWB R LE with brief mobility, but does fatigue quickly with progressive activity. Endorses mild dizziness with transition to upright; will assess orthostatics next session if symptoms persist.  Additional gait efforts deferred as result   Stairs             Wheelchair Mobility     Tilt Bed    Modified Rankin (Stroke Patients Only)       Balance Overall balance assessment: Needs assistance Sitting-balance support: No upper extremity supported, Feet supported Sitting balance-Leahy Scale: Normal     Standing balance support: Bilateral upper extremity supported Standing balance-Leahy Scale: Fair                              Hotel Manager: No apparent difficulties  Cognition Arousal: Alert Behavior During Therapy: WFL for tasks assessed/performed   PT - Cognitive impairments: No apparent impairments  Following commands: Intact      Cueing Cueing Techniques: Verbal cues, Tactile cues  Exercises Other Exercises Other Exercises: Reviewed NWB R LE precautions and  mobility implications/limitations; encouraged use of manual WC for longer distances.  Patient voiced understanding and agreement; has access to manual WC within the home.    General Comments        Pertinent Vitals/Pain Pain Assessment Pain Assessment: No/denies pain    Home Living                          Prior Function            PT Goals (current goals can now be found in the care plan section) Acute Rehab PT Goals Patient Stated Goal: did not state PT Goal Formulation: With patient/family Time For Goal Achievement: 01/18/25 Potential to Achieve Goals: Fair Progress towards PT goals: Progressing toward goals    Frequency    7X/week      PT Plan      Co-evaluation              AM-PAC PT 6 Clicks Mobility   Outcome Measure  Help needed turning from your back to your side while in a flat bed without using bedrails?: None Help needed moving from lying on your back to sitting on the side of a flat bed without using bedrails?: None Help needed moving to and from a bed to a chair (including a wheelchair)?: A Little Help needed standing up from a chair using your arms (e.g., wheelchair or bedside chair)?: A Little Help needed to walk in hospital room?: A Little Help needed climbing 3-5 steps with a railing? : A Lot 6 Click Score: 19    End of Session   Activity Tolerance: Patient limited by fatigue Patient left: in bed;with call bell/phone within reach;with bed alarm set;with family/visitor present   PT Visit Diagnosis: Other abnormalities of gait and mobility (R26.89);Unsteadiness on feet (R26.81);Muscle weakness (generalized) (M62.81);History of falling (Z91.81);Difficulty in walking, not elsewhere classified (R26.2)     Time: 1425-1450 PT Time Calculation (min) (ACUTE ONLY): 25 min  Charges:    $Therapeutic Activity: 23-37 mins PT General Charges $$ ACUTE PT VISIT: 1 Visit                     Debra Hogan, PT, DPT, NCS 01/08/25,  9:27 PM [phone]

## 2025-01-08 NOTE — Plan of Care (Signed)
" °  Problem: Pain Managment: Goal: General experience of comfort will improve and/or be controlled Outcome: Progressing   Problem: Safety: Goal: Ability to remain free from injury will improve Outcome: Progressing   Problem: Skin Integrity: Goal: Demonstrates signs of wound healing without infection Outcome: Progressing   "

## 2025-01-08 NOTE — Progress Notes (Signed)
 PODIATRY: PROGRESS NOTE    Surgery:  Procedures (LRB): OPEN REDUCTION INTERNAL FIXATION (ORIF) ANKLE FRACTURE (Right) POD:  1 Day Post-Op  O/N: NAEON  Subjective:  Patient resting comfortably in bed. Eager to go home if able. Seen by PT feels comfortable with NWB status.  Denies F/C/N/V/SOB/CP. Denies acute calf pain.    PHYSICAL EXAMINATION: BP 129/65 (BP Location: Right Arm)   Pulse (!) 105   Temp 98.7 F (37.1 C)   Resp 20   Ht 5' 5 (1.651 m)   Wt 59 kg   SpO2 90%   BMI 21.63 kg/m ? GEN: NAD. AOX3. ? RESP: Non-labored breathing on RA.? ABD: NT/ND of all four quadrants.? NEURO: Moving all four extremities spontaneously. ? ? FOCUSED LOWER EXTREMITY EXAMINATION:obscured by splint NEURO: ? - SLIT to tib/saph/dp/sp/sural nerve distributions. ? - No paresthesias elicited on examination. ?? - Able to wiggle toes  VASCULAR: ? - Capillary refill <3 seconds. ?  MSK: ? - No calf tenderness. ? - rectus alignment in splint  DERM: ? - Splint c/d/I   ASSESSMENT:?  Debra Hogan is a 64 y.o. female ? #s/p ORIF right ankle fracture, doing well - cleared for DC from podiatry perspective.   PLAN:? - Activity: NWB RLE in posterior splint.  - DVT ppx: ASA 81mg  BID (already taking 81mg  every day - discussed addition and dvt indication).  - Abx ppx: Augmentin  BID x 7 days - Wound Care: Podiatry to conduct post-op.Instructed to keep splint c/d/I. Discussed cast cover.  - Dispo: Home - Follow up with podiatry in 1 week.

## 2025-01-08 NOTE — Care Management Important Message (Signed)
 Important Message  Patient Details  Name: Debra Hogan MRN: [id number] Date of Birth: 1961-03-31   Important Message Given:  Yes - Medicare IM     Shevy Yaney W, CMA 01/08/2025, 11:45 AM

## 2025-01-09 ENCOUNTER — Encounter: Payer: Self-pay | Admitting: Podiatry

## 2025-01-09 LAB — CBC
HCT: 33.3 % — ABNORMAL LOW (ref 36.0–46.0)
Hemoglobin: 10.6 g/dL — ABNORMAL LOW (ref 12.0–15.0)
MCH: 31.3 pg (ref 26.0–34.0)
MCHC: 31.8 g/dL (ref 30.0–36.0)
MCV: 98.2 fL (ref 80.0–100.0)
Platelets: 224 K/uL (ref 150–400)
RBC: 3.39 MIL/uL — ABNORMAL LOW (ref 3.87–5.11)
RDW: 12.8 % (ref 11.5–15.5)
WBC: 12.4 K/uL — ABNORMAL HIGH (ref 4.0–10.5)
nRBC: 0.2 % (ref 0.0–0.2)

## 2025-01-09 LAB — CREATININE, SERUM
Creatinine, Ser: 0.86 mg/dL (ref 0.44–1.00)
GFR, Estimated: >60 mL/min

## 2025-01-09 MED ORDER — BISACODYL 10 MG RE SUPP
10.0000 mg | Freq: Once | RECTAL | Status: AC
  Administered 2025-01-09: 10 mg via RECTAL
  Filled 2025-01-09: qty 1

## 2025-01-09 MED ORDER — AMOXICILLIN-POT CLAVULANATE 875-125 MG PO TABS: 1.0000 | ORAL_TABLET | Freq: Two times a day (BID) | ORAL | 0 refills | Status: AC

## 2025-01-09 MED ORDER — POLYETHYLENE GLYCOL 3350 17 G PO PACK: 17.0000 g | PACK | Freq: Every day | ORAL | 0 refills | Status: AC

## 2025-01-09 MED ORDER — MILK AND MOLASSES ENEMA
1.0000 | Freq: Once | RECTAL | Status: AC
  Administered 2025-01-09: 150 mL via RECTAL
  Filled 2025-01-09: qty 150

## 2025-01-09 MED ORDER — ASPIRIN 81 MG PO TBEC: 81.0000 mg | DELAYED_RELEASE_TABLET | Freq: Two times a day (BID) | ORAL | 0 refills | Status: AC

## 2025-01-09 NOTE — Hospital Course (Signed)
 64 year old female with extensive past medical history including chronic hypoxic respiratory failure on 3 L at home, COPD, MS, chronic pain with opioid dependence, hypertension, hyperlipidemia.  Patient has frequent falls secondary to her MS.  Prior to arrival she tripped and developed sudden pain in her right ankle.  On arrival to the ED imaging revealed trimalleolar fracture and associated tibiotalar dislocation/subluxation.  She was admitted and podiatry was consulted.  Initially podiatry planned for the OR on 1/9 but ultimately patient developed respiratory distress.  She underwent closed reduction instead with plan to focus on her respiratory status before attempting general anesthesia.She required BiPAP through much of 1/9.  On 1/10 she tolerated transition to heated high flow. She has gradually weaned down to 2-3 L via [place] which is her baseline. Underwent ORIF right ankle fracture by Dr Prentice on 01/07/25. Recommend NWB RLE in a splint, aspirin  81 mg bid for DVT ppx, Follow up in podiatry clinic in a week. She had constipation requiring enema prior to discharge.

## 2025-01-09 NOTE — Telephone Encounter (Signed)
 Left message on VM to schedule appointments

## 2025-01-09 NOTE — Progress Notes (Signed)
 Physical Therapy Treatment Patient Details Name: Debra Hogan MRN: [id number] DOB: [birth year] Today's Date: 01/09/2025   History of Present Illness 64 y/o female presented to ED on 01/02/25 following fall and landed on R ankle. Sustained closed displaced comminuted R trimalleolar ankle fx. S/p Closed reduction of R trimalleolar fracture on 1/9. Now s/p ORIF (01/08/24), NWB R LE. PMH: MS, chronic respiratory failure on home O2 at baseline, COPD, anxiety, HTN, fibromyalgia, CAD    PT Comments  Pt was long sitting in bed upon arrival. Agrees to session and remains cooperative but did not want to do more than OOB to recliner. She does well maintaining NWB RLE but does tolerate standing and hopping several feet to recliner from EOB. Pt has w/c and ramp at home. Pt will also benefit from continued skilled PT at DC to maximize independence and safety with all ADLs.    If plan is discharge home, recommend the following: Assistance with cooking/housework;Assist for transportation;Help with stairs or ramp for entrance;A little help with walking and/or transfers;A little help with bathing/dressing/bathroom     Equipment Recommendations  Rolling walker (2 wheels)       Precautions / Restrictions Precautions Precautions: Fall Recall of Precautions/Restrictions: Intact Restrictions Weight Bearing Restrictions Per Provider Order: Yes RLE Weight Bearing Per Provider Order: Non weight bearing     Mobility  Bed Mobility Overal bed mobility: Modified Independent Bed Mobility: Supine to Sit  Supine to sit: Supervision, Used rails, HOB elevated   Transfers Overall transfer level: Needs assistance Equipment used: Rolling walker (2 wheels) Transfers: Sit to/from Stand, Bed to chair/wheelchair/BSC Sit to Stand: Contact guard assist  General transfer comment: CGA for safety with vcs for handplacement, fwd wt shift, and to maintain NWB. BP in standing 116/61 (77)    Ambulation/Gait Ambulation/Gait  assistance: Contact guard assist, Min assist Gait Distance (Feet): 3 Feet Assistive device: Rolling walker (2 wheels) Gait Pattern/deviations: Step-to pattern Gait velocity: decreased  General Gait Details: Pt was able to hop/pivot to recliner form EOB. Unwilling to progress gait further but does have w/c and ramp at home   Balance Overall balance assessment: Needs assistance Sitting-balance support: No upper extremity supported, Feet supported Sitting balance-Leahy Scale: Normal     Standing balance support: Bilateral upper extremity supported, During functional activity, Reliant on assistive device for balance Standing balance-Leahy Scale: Fair         Cognition Arousal: Alert Behavior During Therapy: WFL for tasks assessed/performed   PT - Cognitive impairments: No apparent impairments    PT - Cognition Comments: Pt is A and O x 4. cooperative but not very motivated Following commands: Intact      Cueing Cueing Techniques: Verbal cues, Tactile cues         Pertinent Vitals/Pain Pain Assessment Pain Assessment: No/denies pain    Home Living Family/patient expects to be discharged to:: Private residence Living Arrangements: Spouse/significant other            PT Goals (current goals can now be found in the care plan section) Acute Rehab PT Goals Patient Stated Goal: did not state Progress towards PT goals: Progressing toward goals    Frequency    7X/week       AM-PAC PT 6 Clicks Mobility   Outcome Measure  Help needed turning from your back to your side while in a flat bed without using bedrails?: None Help needed moving from lying on your back to sitting on the side of a flat bed without using bedrails?:  None Help needed moving to and from a bed to a chair (including a wheelchair)?: A Little Help needed standing up from a chair using your arms (e.g., wheelchair or bedside chair)?: A Little Help needed to walk in hospital room?: A Little Help needed  climbing 3-5 steps with a railing? : Total 6 Click Score: 18    End of Session   Activity Tolerance: Patient tolerated treatment well;Other (comment) (self limiting) Patient left: in chair;with call bell/phone within reach;with chair alarm set Nurse Communication: Mobility status PT Visit Diagnosis: Other abnormalities of gait and mobility (R26.89);Unsteadiness on feet (R26.81);Muscle weakness (generalized) (M62.81);History of falling (Z91.81);Difficulty in walking, not elsewhere classified (R26.2)     Time: 8885-8859 PT Time Calculation (min) (ACUTE ONLY): 26 min  Charges:    $Gait Training: 8-22 mins $Therapeutic Activity: 8-22 mins PT General Charges $$ ACUTE PT VISIT: 1 Visit                    Rankin Essex PTA 01/09/25, 12:29 PM

## 2025-01-09 NOTE — TOC Progression Note (Signed)
 Transition of Care [facility]) - Progression Note    Patient Details  Name: Phebe Dettmer Miyasaki MRN: [id number] Date of Birth: 1961/12/13  Transition of Care [facility]) CM/SW Contact  Daved BIRCH Hamilton, RN Phone Number: 01/09/2025, 12:34 PM  Clinical Narrative:     Met with patient, introduced self and explained role. Presented [facility] offers with patient and reviewed [email or web address]. Patient verbalized choice as [facility] and requests they leave a voice mail if she doesn't answer when they call to set up first visit. Patient requests the RW that was recommended by PT. Patient verbalized she uses [employer] out of [place], [place] for her home O2 and informed this CM that she got a voice mail last week from them about the battery operated portable O2 for her. Advised patient I will inquire about that when I order the RW, patient verbalized agreement to this. Patient verbalized she has O2 already in the car for her transport home and that her spouse Garrel will be taking her.   Selected [facility] in the HUB and completed, notified Kelsey at [facility] of patient's choice.   Contacted Mitch with [employer] for the RW and to ask about the O2 device. Mitch advised that patient does not have her home O2 through them however he will have the RW delivered to patient room.     Expected Discharge Plan: Home w Home Health Services Barriers to Discharge: Continued Medical Work up               Expected Discharge Plan and Services     Post Acute Care Choice: Home Health Living arrangements for the past 2 months: Single Family Home                                       Social Drivers of Health (SDOH) Interventions SDOH Screenings   Food Insecurity: No Food Insecurity (01/02/2025)  Housing: Low Risk (01/02/2025)  Transportation Needs: No Transportation Needs (01/02/2025)  Utilities: Not At Risk (01/02/2025)  Alcohol Screen: Low Risk (07/23/2024)  Depression (PHQ2-9): Low Risk (10/22/2024)  Financial Resource Strain:  Medium Risk (10/22/2024)  Physical Activity: Inactive (10/22/2024)  Social Connections: Moderately Isolated (01/09/2025)  Stress: No Stress Concern Present (10/22/2024)  Tobacco Use: High Risk (01/07/2025)  Health Literacy: Adequate Health Literacy (07/23/2024)    Readmission Risk Interventions     No data to display

## 2025-01-09 NOTE — Plan of Care (Signed)

## 2025-01-09 NOTE — Progress Notes (Signed)
 Occupational Therapy Treatment Patient Details Name: Debra Hogan MRN: [id number] DOB: 25-Feb-1961 Today's Date: 01/09/2025   History of present illness 64 y/o female presented to ED on 01/02/25 following fall and landed on R ankle. Sustained closed displaced comminuted R trimalleolar ankle fx. S/p Closed reduction of R trimalleolar fracture on 1/9. Now s/p ORIF (01/08/24), NWB R LE. PMH: MS, chronic respiratory failure on home O2 at baseline, COPD, anxiety, HTN, fibromyalgia, CAD   OT comments  Upon entering the room, pt seated on BSC and attempting to have BM. Pt unable to void and OT notified RN that she is requesting suppository. Pt stands for ~ 2 minutes while RN places suppository. Pt manages to remain NWB while standing but needing min A for standing balance. Pt declines dressing tasks this session and would like to sit on [facility] for BM and call for RN when done. Call bell and all needed items within reach upon exiting the room.       If plan is discharge home, recommend the following:  A lot of help with walking and/or transfers;A lot of help with bathing/dressing/bathroom;Help with stairs or ramp for entrance;Supervision due to cognitive status   Equipment Recommendations  Other (comment) (defer)       Precautions / Restrictions Precautions Precautions: Fall Recall of Precautions/Restrictions: Intact Restrictions Weight Bearing Restrictions Per Provider Order: Yes RLE Weight Bearing Per Provider Order: Non weight bearing       Mobility Bed Mobility                    Transfers Overall transfer level: Needs assistance Equipment used: Rolling walker (2 wheels) Transfers: Sit to/from Stand Sit to Stand: Min assist                 Balance Overall balance assessment: Needs assistance Sitting-balance support: No upper extremity supported, Feet supported Sitting balance-Leahy Scale: Normal     Standing balance support: Bilateral upper extremity supported, During  functional activity, Reliant on assistive device for balance Standing balance-Leahy Scale: Fair                             ADL either performed or assessed with clinical judgement   ADL                           Toilet Transfer: Minimal assistance;BSC/3in1;Rolling walker (2 wheels)                  Extremity/Trunk Assessment Upper Extremity Assessment Upper Extremity Assessment: Generalized weakness   Lower Extremity Assessment Lower Extremity Assessment: Generalized weakness        Vision Patient Visual Report: No change from baseline           Communication Communication Communication: Impaired Factors Affecting Communication: Hearing impaired   Cognition Arousal: Alert Behavior During Therapy: WFL for tasks assessed/performed Cognition: No apparent impairments                               Following commands: Intact Following commands impaired: Follows one step commands with increased time      Cueing   Cueing Techniques: Verbal cues, Tactile cues             Pertinent Vitals/ Pain       Pain Assessment Pain Assessment: No/denies pain  Frequency  Min 2X/week        Progress Toward Goals  OT Goals(current goals can now be found in the care plan section)  Progress towards OT goals: Progressing toward goals      AM-PAC OT 6 Clicks Daily Activity     Outcome Measure   Help from another person eating meals?: None Help from another person taking care of personal grooming?: A Little Help from another person toileting, which includes using toliet, bedpan, or urinal?: A Lot Help from another person bathing (including washing, rinsing, drying)?: A Lot Help from another person to put on and taking off regular upper body clothing?: A Little Help from another person to put on and taking off regular lower body clothing?: A Lot 6 Click Score: 16    End of Session Equipment Utilized During Treatment:  Oxygen   OT Visit Diagnosis: Unsteadiness on feet (R26.81);Other abnormalities of gait and mobility (R26.89);Muscle weakness (generalized) (M62.81);History of falling (Z91.81)   Activity Tolerance Patient tolerated treatment well   Patient Left Other (comment) (seated on BSC for BM)   Nurse Communication Mobility status        Time: 8689-8675 OT Time Calculation (min): 14 min  Charges: OT General Charges $OT Visit: 1 Visit OT Treatments $Self Care/Home Management : 8-22 mins  Izetta Claude, MS, OTR/L , CBIS ascom [phone]  01/09/25, 2:15 PM

## 2025-01-09 NOTE — Discharge Summary (Signed)
 " Physician Discharge Summary   Patient: Debra Hogan MRN: [id number] DOB: 13-Dec-1961  Admit date:     01/02/2025  Discharge date: 01/09/25  Discharge Physician: Genell SAUNDERS Ion Gonnella   PCP: Dineen Rollene MATSU, FNP   Recommendations at discharge:    Podiatry and PCP  Discharge Diagnoses: Principal Problem:   Closed displaced trimalleolar fracture of right ankle, initial encounter Active Problems:   Anxiety and depression   Essential hypertension   Chronic obstructive pulmonary disease (COPD) (HCC)   Dyslipidemia   Pericardial effusion  Resolved Problems:   * No resolved hospital problems. *  Hospital Course: 64 year old female with extensive past medical history including chronic hypoxic respiratory failure on 3 L at home, COPD, MS, chronic pain with opioid dependence, hypertension, hyperlipidemia.  Patient has frequent falls secondary to her MS.  Prior to arrival she tripped and developed sudden pain in her right ankle.  On arrival to the ED imaging revealed trimalleolar fracture and associated tibiotalar dislocation/subluxation.  She was admitted and podiatry was consulted.  Initially podiatry planned for the OR on 1/9 but ultimately patient developed respiratory distress.  She underwent closed reduction instead with plan to focus on her respiratory status before attempting general anesthesia.She required BiPAP through much of 1/9.  On 1/10 she tolerated transition to heated high flow. She has gradually weaned down to 2-3 L via [place] which is her baseline. Underwent ORIF right ankle fracture by Dr Prentice on 01/07/25. Recommend NWB RLE in a splint, aspirin  81 mg bid for DVT ppx, Follow up in podiatry clinic in a week. She had constipation requiring enema prior to discharge.          Consultants: Podiatry  Procedures performed: ORIF  Disposition: Home health Diet recommendation:  Discharge Diet Orders (From admission, onward)     Start     Ordered   01/09/25 0000  Diet general         01/09/25 1650           Regular diet DISCHARGE MEDICATION: Allergies as of 01/09/2025       Reactions   Trazodone  And Nefazodone Other (See Comments)   Chest pain   Valium [diazepam] Anxiety        Medication List     TAKE these medications    acyclovir  400 MG tablet Commonly known as: ZOVIRAX  Take 1 tablet (400 mg total) by mouth daily.   ALPRAZolam  0.25 MG tablet Commonly known as: XANAX  Take 1 tablet by mouth twice daily as needed for anxiety   amoxicillin -clavulanate 875-125 MG tablet Commonly known as: AUGMENTIN  Take 1 tablet by mouth every 12 (twelve) hours for 6 days.   aspirin  EC 81 MG tablet Take 1 tablet (81 mg total) by mouth 2 (two) times daily. Swallow whole. What changed:  when to take this additional instructions   Centrum Silver Ultra Womens Tabs Take 1 tablet by mouth daily.   cholecalciferol  1000 units tablet Commonly known as: VITAMIN D  Take 1,000 Units by mouth daily.   cyclobenzaprine  10 MG tablet Commonly known as: FLEXERIL  Take 10 mg by mouth at bedtime.   cycloSPORINE 0.05 % ophthalmic emulsion Commonly known as: RESTASIS 1 drop 2 (two) times daily.   diltiazem  180 MG 24 hr capsule Commonly known as: CARDIZEM  CD TAKE 1 CAPSULE BY MOUTH ONCE DAILY   escitalopram  20 MG tablet Commonly known as: LEXAPRO  Take 1 tablet (20 mg total) by mouth daily.   fluticasone -salmeterol 250-50 MCG/ACT Aepb Commonly known as: ADVAIR Inhale 1  puff into the lungs in the morning and at bedtime.   gabapentin  100 MG capsule Commonly known as: NEURONTIN  Take 1 capsule (100 mg total) by mouth 3 (three) times daily.   HYDROcodone -acetaminophen  5-325 MG tablet Commonly known as: NORCO/VICODIN Take 1 tablet by mouth every 4 (four) hours as needed.   isosorbide  mononitrate 60 MG 24 hr tablet Commonly known as: IMDUR  Take 1 tablet (60 mg total) by mouth daily.   lisinopril  40 MG tablet Commonly known as: ZESTRIL  Take 1 tablet (40 mg total) by  mouth daily.   meloxicam  15 MG tablet Commonly known as: MOBIC  Take 15 mg by mouth daily.   nitroGLYCERIN  0.4 MG SL tablet Commonly known as: Nitrostat  Place 1 tablet (0.4 mg total) under the tongue every 5 (five) minutes as needed for chest pain.   ondansetron  4 MG disintegrating tablet Commonly known as: ZOFRAN -ODT Take 1 tablet (4 mg total) by mouth every 6 (six) hours as needed for nausea or vomiting.   polyethylene glycol 17 g packet Commonly known as: MIRALAX  / GLYCOLAX  Take 17 g by mouth daily.   ProAir  RespiClick 108 (90 Base) MCG/ACT Aepb Generic drug: Albuterol  Sulfate INHALE 1 PUFF BY MOUTH EVERY 6 HOURS AS NEEDED FOR COUGH   rosuvastatin  40 MG tablet Commonly known as: CRESTOR  Take 1 tablet (40 mg total) by mouth daily.   Spiriva  Respimat 2.5 MCG/ACT Aers Generic drug: Tiotropium Bromide  INHALE 2 SPRAY(S) BY MOUTH ONCE DAILY               Durable Medical Equipment  (From admission, onward)           Start     Ordered   01/08/25 1629  For home use only DME Walker rolling  Once       Question Answer Comment  Walker: With 5 Inch Wheels   Patient needs a walker to treat with the following condition Ankle fracture, right      01/08/25 1628              Discharge Care Instructions  (From admission, onward)           Start     Ordered   01/09/25 0000  Leave dressing on - Keep it clean, dry, and intact until clinic visit        01/09/25 1650            Contact information for follow-up providers     Lennie Barter, DPM Follow up in 1 week(s).   Specialty: Podiatry Contact information: [address] [place] [address] [phone]         Dineen Rollene MATSU, FNP. Schedule an appointment as soon as possible for a visit in 1 week(s).   Specialty: Family Medicine Contact information: [address] [place] [address] [place] [address] [phone]              Contact information for after-discharge care      Home Medical Care     [facility]) .   Service: Home Health Services Contact information: [address] [place]  [address] [phone]                    Discharge Exam: Fredricka Weights   01/02/25 0151  Weight: 59 kg    Constitutional: NAD, AAOx3 HEENT: conjunctivae and lids normal, EOMI CV: No cyanosis.   RESP: normal respiratory effort Neuro: II - XII grossly intact.  ExtL: Right foot in splint.   Condition at discharge: stable  The results of significant diagnostics from this hospitalization (including imaging, microbiology, ancillary and laboratory) are listed below for reference.   Imaging Studies: DG Ankle 2 Views Right Result Date: 01/07/2025 CLINICAL DATA:  Elective surgery. EXAM: RIGHT ANKLE - 2 VIEW COMPARISON:  Preoperative imaging FINDINGS: Nine fluoroscopic spot views of the ankle submitted from the operating room. Lateral plate and screw fixation of distal fibular fracture. Two screws traverse the medial malleolar fracture. Improved alignment of posterior malleolar fracture. Improved mortise alignment. Fluoroscopy time 1 minutes 5 seconds, dose 2.06 mGy. IMPRESSION: Intraoperative fluoroscopy during ORIF of trimalleolar fracture. Electronically Signed   By: Andrea Gasman M.D.   On: 01/07/2025 14:01   DG C-Arm 1-60 Min-No Report Result Date: 01/07/2025 Fluoroscopy was utilized by the requesting physician.  No radiographic interpretation.   DG C-Arm 1-60 Min-No Report Result Date: 01/07/2025 Fluoroscopy was utilized by the requesting physician.  No radiographic interpretation.   US  OR NERVE BLOCK-IMAGE ONLY [facility]) Result Date: 01/07/2025 There is no interpretation for this exam.  This order is for images obtained during a surgical procedure.  Please See Surgeries Tab for more information regarding the procedure.   ECHOCARDIOGRAM COMPLETE Result Date: 01/04/2025    ECHOCARDIOGRAM REPORT   Patient  Name:   JERMIYAH RICOTTA Andrades Date of Exam: 01/04/2025 Medical Rec #:  [id number]    Height:       65.0 in Accession #:    [id number]   Weight:       130.0 lb Date of Birth:  02-Apr-1961    BSA:          1.647 m Patient Age:    63 years     BP:           128/65 mmHg Patient Gender: F            HR:           112 bpm. Exam Location:  [facility] Procedure: 2D Echo, 3D Echo, Cardiac Doppler, Color Doppler and Strain Analysis            (Both Spectral and Color Flow Doppler were utilized during            procedure). STAT ECHO Indications:     Pericardial Effusion  History:         Patient has prior history of Echocardiogram examinations, most                  recent 06/12/2024. CHF, Angina, CAD and Previous Myocardial                  Infarction, Pulmonary HTN, Signs/Symptoms:Dyspnea and Fatigue;                  Risk Factors:Hypertension, Current Smoker and Sleep Apnea.  Sonographer:     Logan Shove RDCS Referring Phys:  [id number] LORANE POLAND Diagnosing Phys: Annabella Scarce MD IMPRESSIONS  1. Left ventricular ejection fraction, by estimation, is 55 to 60%. Left ventricular ejection fraction by 3D volume is 59 %. The left ventricle has normal function. The left ventricle has no regional wall motion abnormalities. There is mild asymmetric left ventricular hypertrophy of the septal segment. Indeterminate diastolic filling due to E-A fusion. The average left ventricular global longitudinal strain is -20.5 %. The global longitudinal strain is normal.  2. Right ventricular systolic function is normal. The right ventricular size is normal. There is mildly elevated pulmonary artery systolic  pressure.  3. Pericardial effusion compared side by side with the echo 05/2024 and is unchanged in size. Measures 1.2 cm. Moderate pericardial effusion. There is no evidence of cardiac tamponade.  4. The mitral valve is normal in structure. No evidence of mitral valve regurgitation. No evidence of mitral stenosis.  5. The aortic valve is tricuspid. Aortic  valve regurgitation is not visualized. No aortic stenosis is present.  6. The inferior vena cava is normal in size with <50% respiratory variability, suggesting right atrial pressure of 8 mmHg. FINDINGS  Left Ventricle: Left ventricular ejection fraction, by estimation, is 55 to 60%. Left ventricular ejection fraction by 3D volume is 59 %. The left ventricle has normal function. The left ventricle has no regional wall motion abnormalities. The average left ventricular global longitudinal strain is -20.5 %. Strain was performed and the global longitudinal strain is normal. The left ventricular internal cavity size was normal in size. There is mild asymmetric left ventricular hypertrophy of the septal segment. Indeterminate diastolic filling due to E-A fusion. Right Ventricle: The right ventricular size is normal. No increase in right ventricular wall thickness. Right ventricular systolic function is normal. There is mildly elevated pulmonary artery systolic pressure. The tricuspid regurgitant velocity is 2.74  m/s, and with an assumed right atrial pressure of 8 mmHg, the estimated right ventricular systolic pressure is 38.0 mmHg. Left Atrium: Left atrial size was normal in size. Right Atrium: Right atrial size was normal in size. Pericardium: Pericardial effusion compared side by side with the echo 05/2024 and is unchanged in size. Measures 1.2 cm. A moderately sized pericardial effusion is present. There is diastolic collapse of the right atrial wall. There is no evidence of cardiac tamponade. Mitral Valve: The mitral valve is normal in structure. No evidence of mitral valve regurgitation. No evidence of mitral valve stenosis. Tricuspid Valve: The tricuspid valve is normal in structure. Tricuspid valve regurgitation is trivial. No evidence of tricuspid stenosis. Aortic Valve: The aortic valve is tricuspid. Aortic valve regurgitation is not visualized. No aortic stenosis is present. Aortic valve peak gradient measures  8.6 mmHg. Pulmonic Valve: The pulmonic valve was normal in structure. Pulmonic valve regurgitation is not visualized. No evidence of pulmonic stenosis. Aorta: The aortic root is normal in size and structure. Venous: The inferior vena cava is normal in size with less than 50% respiratory variability, suggesting right atrial pressure of 8 mmHg. IAS/Shunts: No atrial level shunt detected by color flow Doppler. Additional Comments: 3D was performed not requiring image post processing on an independent workstation and was normal.  LEFT VENTRICLE PLAX 2D LVIDd:         3.90 cm         Diastology LVIDs:         2.40 cm         LV e' medial:  12.60 cm/s LV PW:         0.80 cm         LV e' lateral: 21.70 cm/s LV IVS:        1.10 cm LVOT diam:     2.00 cm         2D Longitudinal LVOT Area:     3.14 cm        Strain                                2D Strain GLS   -20.4 %                                (  A4C):                                2D Strain GLS   -20.9 %                                (A3C):                                2D Strain GLS   -20.1 %                                (A2C):                                2D Strain GLS   -20.5 %                                Avg:                                 3D Volume EF                                LV 3D EF:    Left                                             ventricul                                             ar                                             ejection                                             fraction                                             by 3D                                             volume is  59 %.                                 3D Volume EF:                                3D EF:        59 %                                LV EDV:       78 ml                                LV ESV:       32 ml                                LV SV:        46 ml RIGHT VENTRICLE             IVC RV Basal diam:  3.10 cm      IVC diam: 1.40 cm RV S prime:     17.44 cm/s TAPSE (M-mode): 1.9 cm LEFT ATRIUM             Index        RIGHT ATRIUM           Index LA diam:        3.30 cm 2.00 cm/m   RA Area:     12.30 cm LA Vol (A2C):   42.3 ml 25.68 ml/m  RA Volume:   29.40 ml  17.85 ml/m LA Vol (A4C):   30.9 ml 18.76 ml/m LA Biplane Vol: 37.2 ml 22.58 ml/m  AORTIC VALVE AV Area (Vmax): 2.82 cm AV Vmax:        147.00 cm/s AV Peak Grad:   8.6 mmHg LVOT Vmax:      131.75 cm/s  AORTA Ao Root diam: 2.20 cm Ao Asc diam:  2.70 cm TRICUSPID VALVE TR Peak grad:   30.0 mmHg TR Vmax:        274.00 cm/s  SHUNTS Systemic Diam: 2.00 cm Annabella Scarce MD Electronically signed by Annabella Scarce MD Signature Date/Time: 01/04/2025/12:28:37 PM    Final    CT Angio Chest Pulmonary Embolism (PE) W or WO Contrast Result Date: 01/03/2025 EXAM: CTA of the Chest with contrast for PE 01/03/2025 08:27:07 PM TECHNIQUE: CTA of the chest was performed after the administration of 75 mL of intravenous contrast (iohexol  (OMNIPAQUE ) 350 MG/ML injection 75 mL IOHEXOL  350 MG/ML SOLN). Multiplanar reformatted images are provided for review. MIP images are provided for review. Automated exposure control, iterative reconstruction, and/or weight based adjustment of the mA/kV was utilized to reduce the radiation dose to as low as reasonably achievable. COMPARISON: 05/05/2024 CLINICAL HISTORY: Pulmonary embolism (PE) high prob; persistent hypoxia. Known emphysema, and MS. DVT evaluation. FINDINGS: PULMONARY ARTERIES: Pulmonary arteries are adequately opacified for evaluation. No pulmonary embolism. Main pulmonary artery is normal in caliber. MEDIASTINUM: Mild coronary artery calcification. Global cardiac size within normal limits. Moderate pericardial effusion, enlarged since prior examination. Mild atherosclerotic calcification within the thoracic aorta. There is no acute abnormality of the thoracic aorta. LYMPH NODES: No mediastinal, hilar or  axillary  lymphadenopathy. LUNGS AND PLEURA: Moderate emphysema. Bibasilar atelectasis. Small left pleural effusion. No pneumothorax. UPPER ABDOMEN: Limited images of the upper abdomen are unremarkable. SOFT TISSUES AND BONES: No acute bone or soft tissue abnormality. IMPRESSION: 1. No pulmonary embolism. 2. Moderate pericardial effusion, enlarged since prior examination. 3. Moderate emphysema, and pulmonary emphysema is an independent risk factor for lung cancer; recommend consideration for evaluation for a low-dose CT lung cancer screening program. 4. Mild coronary artery calcification. 5. Raf score includes aortic atherosclerosis (ICD10-I70.0) and emphysema (ICD10-J43.9). Electronically signed by: Dorethia Molt MD MD 01/03/2025 08:45 PM EST RP Workstation: [id number]   DG Chest Port 1 View Result Date: 01/02/2025 CLINICAL DATA:  Hypoxia. EXAM: PORTABLE CHEST 1 VIEW COMPARISON:  Earlier today FINDINGS: Stable cardiomegaly. Unchanged emphysema, hyperinflation with central bronchial thickening. No focal airspace disease, pleural effusion, pulmonary edema or pneumothorax. Stable osseous structures. IMPRESSION: 1. Stable cardiomegaly. 2. Unchanged emphysema, hyperinflation and central bronchial thickening. Electronically Signed   By: Andrea Gasman M.D.   On: 01/02/2025 16:02   DG Ankle 2 Views Right Result Date: 01/02/2025 CLINICAL DATA:  Elective surgery. EXAM: RIGHT ANKLE - 2 VIEW COMPARISON:  Radiograph earlier today FINDINGS: Two fluoroscopic spot views of the ankle submitted from the operating room. Improved alignment of trimalleolar fracture post splint placement. Improved mortise alignment. Fluoroscopy time 13.9 seconds. Fluoroscopy dose not provided. IMPRESSION: Fluoroscopic spot views during reduction of trimalleolar fracture. Electronically Signed   By: Andrea Gasman M.D.   On: 01/02/2025 16:02   CT Ankle Right Wo Contrast Result Date: 01/02/2025 CLINICAL DATA:  Ankle trauma. EXAM: CT OF THE RIGHT ANKLE  WITHOUT CONTRAST TECHNIQUE: Multidetector CT imaging of the right ankle was performed according to the standard protocol. Multiplanar CT image reconstructions were also generated. RADIATION DOSE REDUCTION: This exam was performed according to the departmental dose-optimization program which includes automated exposure control, adjustment of the mA and/or kV according to patient size and/or use of iterative reconstruction technique. COMPARISON:  Earlier same day radiographs. FINDINGS: Bones/Joint/Cartilage Redemonstrated trimalleolar ankle fracture with tibiotalar dislocation/subluxation. Comminuted oblique transsyndesmotic fracture of the distal fibular metaphysis with up to 6 mm of posterior displacement and 8 mm of lateral displacement of the distal fracture component. Associated lateral angulation. A 1.6 x 1.4 x 0.7 cm fracture fragment is anteromedially displaced between the anterior distal margin of the proximal fibular fracture component and the adjacent distal anterolateral tibia (series 3, images 115-121; series 6, images 77-81; series 7, image 56). Smaller additional surrounding punctate osseous fragments. Medial malleolus fracture demonstrates 7 mm of lateral displacement. Posterior malleolar fracture demonstrates 4 mm of superior displacement proximally and up to 7 mm of posterior displacement/distraction laterally. Fracture margins extend to the tibial plafond with mild cortical surface step-off and up to 4 mm of distraction at the posterior articular surface (series 7, images 66 and 67). Persistent lateral tibiotalar subluxation/dislocation. The medial cortical fracture margin of the distal tibial metaphysis abuts the overlying skin. No additional fracture identified. Postoperative changes of the first metatarsal. Small plantar calcaneal spur. Soft tissue Overlying soft tissue splint in place. Diffuse subcutaneous edema and soft tissue swelling of the ankle, most pronounced laterally and medially  extending through the dorsal foot. The posterior tibialis and flexor digitorum longus tendons abut the medial margin of the posterior malleolar fracture without convincing evidence of entrapment. IMPRESSION: 1. Redemonstrated trimalleolar ankle fracture with persistent lateral tibiotalar subluxation/dislocation. The medial cortical fracture margin of the distal tibial metaphysis abuts the overlying skin. 2. Comminuted oblique  transsyndesmotic fracture of the distal fibular metaphysis with up to 6 mm of posterior displacement and 8 mm of lateral displacement of the distal fracture component. Associated lateral angulation. 3. A 1.6 x 1.4 x 0.7 cm fracture fragment is displaced between the anterior distal margin of the proximal fibular fracture component and the adjacent distal anterolateral tibia. Smaller additional surrounding punctate osseous fragments. 4. Medial malleolus fracture demonstrates 7 mm of lateral displacement. 5. Posterior malleolar fracture demonstrates 4 mm of superior displacement proximally and up to 7 mm of posterior displacement/distraction laterally. Fracture margins extend to the tibial plafond with mild cortical surface step-off and up to 4 mm of distraction at the posterior articular surface. 6. The posterior tibialis and flexor digitorum longus tendons abut the medial margin of the posterior malleolar fracture without convincing evidence of entrapment. 7. Diffuse subcutaneous edema and soft tissue swelling of the ankle, most pronounced laterally and medially extending through the dorsal foot. Electronically Signed   By: Harrietta Sherry M.D.   On: 01/02/2025 11:33   DG C-Arm 1-60 Min-No Report Result Date: 01/02/2025 Fluoroscopy was utilized by the requesting physician.  No radiographic interpretation.   DG Chest Port 1 View Result Date: 01/02/2025 CLINICAL DATA:  Hypoxia EXAM: PORTABLE CHEST 1 VIEW COMPARISON:  July 22, 2018. FINDINGS: Stable cardiomegaly. Emphysematous disease is  noted. No acute pulmonary abnormality is noted. Bony thorax is unremarkable. IMPRESSION: No active disease. Electronically Signed   By: Lynwood Landy Raddle M.D.   On: 01/02/2025 08:45   US  OR NERVE BLOCK-IMAGE ONLY [facility]) Result Date: 01/02/2025 There is no interpretation for this exam.  This order is for images obtained during a surgical procedure.  Please See Surgeries Tab for more information regarding the procedure.   DG Ankle 2 Views Right Result Date: 01/02/2025 EXAM: 2 VIEW(S) XRAY OF THE RIGHT ANKLE 01/02/2025 02:18:00 AM CLINICAL HISTORY: Injury COMPARISON: None available. FINDINGS: BONES AND JOINTS: Laterally displaced, angulated distal fibular fracture. Mildly displaced medial malleolar fracture. Suspected posterior malleolar fracture, although poorly visualized. Tibiotalar anterolateral dislocation/subluxation. SOFT TISSUES: Associated soft tissue swelling. IMPRESSION: 1. Suspected trimalleolar fracture, although posterior component poorly visualized. 2. Associated tibiotalar dislocation/subluxation. Electronically signed by: Pinkie Pebbles MD MD 01/02/2025 02:24 AM EST RP Workstation: [id number]    Microbiology: Results for orders placed or performed in visit on 01/03/24  Urine Culture     Status: None   Collection Time: 01/03/24  4:27 PM   Specimen: Blood  Result Value Ref Range Status   MICRO NUMBER: [id number]  Final   SPECIMEN QUALITY: Adequate  Final   Sample Source URINE  Final   STATUS: FINAL  Final   Result: No Growth  Final    Labs: CBC: Recent Labs  Lab 01/03/25 0522 01/05/25 0513 01/06/25 2309 01/09/25 0620  WBC 7.7 9.7 13.1* 12.4*  NEUTROABS  --   --  11.4*  --   HGB 9.8* 9.9* 10.9* 10.6*  HCT 30.8* 29.8* 33.1* 33.3*  MCV 100.0 95.5 95.1 98.2  PLT 193 221 264 224   Basic Metabolic Panel: Recent Labs  Lab 01/03/25 0522 01/05/25 0513 01/06/25 2309 01/08/25 0511 01/09/25 0620  NA 142 142 142 138  --   K 4.2 3.5 3.3* 4.0  --   CL 97* 96* 97* 95*  --    CO2 38* 38* 34* 34*  --   GLUCOSE 133* 149* 162* 153*  --   BUN 21 25* 51* 54*  --   CREATININE 0.55 0.67 0.91 1.11* 0.86  CALCIUM  9.5 9.7 9.4 9.0  --   MG  --  1.9  --  1.9  --   PHOS  --  1.9*  --  3.6  --    Liver Function Tests: Recent Labs  Lab 01/05/25 0513 01/06/25 2309  AST 27 38  ALT 15 29  ALKPHOS 65 78  BILITOT 0.3 0.4  PROT 6.3* 6.3*  ALBUMIN 3.8 3.8   CBG: No results for input(s): GLUCAP in the last 168 hours.  Discharge time spent: greater than 30 minutes.  Signed: Genell JONELLE Overcast, MD [facility] 01/09/2025 "

## 2025-01-09 NOTE — Progress Notes (Signed)
 " PROGRESS NOTE    Debra Hogan  FMW:[id number] DOB: 02/12/1961 DOA: 01/02/2025 PCP: Dineen Rollene MATSU, FNP  144A/144A-AA  LOS: 7 days   Brief hospital course:   Assessment & Plan: Debra Hogan is a 64 year old female with extensive past medical history including chronic hypoxic respiratory failure on 3 L at home, COPD, MS, chronic pain with opioid dependence, hypertension, hyperlipidemia.  Patient has frequent falls secondary to her MS.  Prior to arrival she tripped and developed sudden pain in her right ankle.  On arrival to the ED imaging revealed trimalleolar fracture and associated tibiotalar dislocation/subluxation.  She was admitted and podiatry was consulted.  Initially podiatry planned for the OR on 1/9 but ultimately patient developed respiratory distress.  She underwent closed reduction instead with plan to focus on her respiratory status before attempting general anesthesia. She required BiPAP through much of 1/9.  On 1/10 she tolerated transition to heated high flow. She has gradually weaned down to [place].    Trimalleolar fracture, right --ORIF of right ankle fracture on 1/14.  --non-weight-bearing to RLE in posterior splint --DVT ppx: ASA 81 BID --Continue Augmentin  for 7 days --Follow up with podiatry in 1 week.   Constipation No BM in 7 days Continue bowel regimen, will give enema Dc home today if she has BM   Acute on chronic hypercapnic respiratory failure COPD with exacerbation On 3L O2 at baseline - Chronically on 3 L, has required BiPAP.  ABG 1/9 with 7.4 2/79/58/51 - CXR without evidence of new infiltrate - CTA negative for PE.  Does reveal pericardial effusion - Have consulted with pulmonology appreciate further recommendations --started tx for COPD exacerbation.  Completed steroid burst with 5 days of IV solumedrol --cont bronchodilators --Continue supplemental O2 to keep sats >=90%, wean as tolerated  Chronic Pericardial effusion - Better evaluated on  echocardiogram 1/11 which demonstrates size is largely unchanged from prior in June 2025.  No evidence of tamponade - There was question of possible pericarditis but CRP is WNL.  She is currently on steroid therapy for COPD exacerbation as above. - Cardiology recommending follow-up echo in 1 month to reassess pericardial effusion.   Multiple sclerosis Fibromyalgia Chronic pain Opioid dependence - Takes daily Percocet  - MS followed outpatient by Dr. Maree   Sleep apnea   Current tobacco abuse - Has been counseled on cessation   CAD --cont asa and statin  History of coronary vasospasm Congestive heart failure with preserved EF   Dyslipidemia - Continue statin   Essential hypertension --cont cardizem , Imdur  and lisinopril    Anxiety and depression --cont Lexapro  --cont Xanax  PRN  Hypokalemia --monitor and supplement PRN  Cr increase --not quite meet criteria for AKI.  Likely due to dehydration from reduced oral hydration. --NS@100  for 10 hours   DVT prophylaxis: Lovenox  SQ Code Status: Full code  Family Communication:  Level of care: Med-Surg Dispo:   The patient is from: home Anticipated d/c is to: home Anticipated d/c date is: today pending BM   Subjective and Interval History:   Patient seen and examined Doing well. Breathing better No BM for the past one week Denies any n/v/abdominal pain    Objective: Vitals:   01/08/25 1543 01/08/25 2113 01/09/25 0308 01/09/25 0826  BP: 129/65 137/76 118/68 133/70  Pulse: (!) 105 85 76 88  Resp: 20 18 15 17   Temp: 98.7 F (37.1 C) 97.7 F (36.5 C) 98.3 F (36.8 C) (!) 97.4 F (36.3 C)  TempSrc:  Oral   SpO2: 90% 93% 95% 94%  Weight:      Height:        Intake/Output Summary (Last 24 hours) at 01/09/2025 1401 Last data filed at 01/09/2025 1018 Gross per 24 hour  Intake 684.37 ml  Output 600 ml  Net 84.37 ml   Filed Weights   01/02/25 0151  Weight: 59 kg    Examination:   Constitutional: NAD,  AAOx3 HEENT: conjunctivae and lids normal, EOMI CV: No cyanosis.   RESP: normal respiratory effort Neuro: II - XII grossly intact.   Psych: Normal mood and affect.  Appropriate judgement and reason   Data Reviewed: I have personally reviewed labs and imaging studies  Time spent: 35 minutes  Debra JONELLE Overcast, MD [facility] If 7PM-7AM, please contact night-coverage 01/09/2025, 2:01 PM   "

## 2025-01-12 ENCOUNTER — Telehealth: Payer: Self-pay | Admitting: *Deleted

## 2025-01-12 NOTE — Transitions of Care (Post Inpatient/ED Visit) (Signed)
" ° °  01/12/2025  Name: Debra Hogan MRN: [id number] DOB: 12/20/1961  Today's TOC FU Call Status: Today's TOC FU Call Status:: Unsuccessful Call (1st Attempt) Unsuccessful Call (1st Attempt) Date: 01/12/25  Attempted to reach the patient regarding the most recent Inpatient/ED visit.  Follow Up Plan: Additional outreach attempts will be made to reach the patient to complete the Transitions of Care (Post Inpatient/ED visit) call.   Cathlean Headland BSN RN  [facility] Health Care Management Coordinator [email or web address] Direct Dial: [phone]  Fax: [phone] Website: [email or web address]  "

## 2025-01-22 ENCOUNTER — Ambulatory Visit: Admitting: Family

## 2025-01-23 ENCOUNTER — Ambulatory Visit: Admitting: Family

## 2025-02-03 ENCOUNTER — Ambulatory Visit: Admitting: Family

## 2025-07-27 ENCOUNTER — Ambulatory Visit
